# Patient Record
Sex: Female | Born: 1965 | Race: White | Hispanic: No | Marital: Married | Smoking: Current every day smoker
Health system: Southern US, Community
[De-identification: ages and names within clinical notes are randomized; demographics above are authoritative.]

## PROBLEM LIST (undated history)

## (undated) DIAGNOSIS — Z862 Personal history of diseases of the blood and blood-forming organs and certain disorders involving the immune mechanism: Secondary | ICD-10-CM

## (undated) DIAGNOSIS — J45909 Unspecified asthma, uncomplicated: Secondary | ICD-10-CM

## (undated) DIAGNOSIS — F909 Attention-deficit hyperactivity disorder, unspecified type: Secondary | ICD-10-CM

## (undated) DIAGNOSIS — D649 Anemia, unspecified: Secondary | ICD-10-CM

## (undated) DIAGNOSIS — G4730 Sleep apnea, unspecified: Secondary | ICD-10-CM

## (undated) DIAGNOSIS — F419 Anxiety disorder, unspecified: Secondary | ICD-10-CM

## (undated) DIAGNOSIS — C801 Malignant (primary) neoplasm, unspecified: Secondary | ICD-10-CM

## (undated) DIAGNOSIS — K219 Gastro-esophageal reflux disease without esophagitis: Secondary | ICD-10-CM

## (undated) DIAGNOSIS — M1990 Unspecified osteoarthritis, unspecified site: Secondary | ICD-10-CM

## (undated) DIAGNOSIS — K5792 Diverticulitis of intestine, part unspecified, without perforation or abscess without bleeding: Secondary | ICD-10-CM

## (undated) DIAGNOSIS — L932 Other local lupus erythematosus: Secondary | ICD-10-CM

## (undated) DIAGNOSIS — I10 Essential (primary) hypertension: Secondary | ICD-10-CM

## (undated) DIAGNOSIS — N393 Stress incontinence (female) (male): Secondary | ICD-10-CM

## (undated) HISTORY — PX: TUBAL LIGATION: SHX77

## (undated) SURGERY — Surgical Case
Anesthesia: *Unknown

---

## 1998-06-12 ENCOUNTER — Inpatient Hospital Stay (HOSPITAL_COMMUNITY): Admission: AD | Admit: 1998-06-12 | Discharge: 1998-06-12 | Payer: Self-pay | Admitting: *Deleted

## 1999-07-11 ENCOUNTER — Ambulatory Visit (HOSPITAL_COMMUNITY): Admission: RE | Admit: 1999-07-11 | Discharge: 1999-07-11 | Payer: Self-pay

## 2004-02-28 ENCOUNTER — Emergency Department (HOSPITAL_COMMUNITY): Admission: EM | Admit: 2004-02-28 | Discharge: 2004-02-28 | Payer: Self-pay | Admitting: Emergency Medicine

## 2004-06-16 ENCOUNTER — Other Ambulatory Visit: Admission: RE | Admit: 2004-06-16 | Discharge: 2004-06-16 | Payer: Self-pay | Admitting: Obstetrics and Gynecology

## 2004-12-23 ENCOUNTER — Encounter (INDEPENDENT_AMBULATORY_CARE_PROVIDER_SITE_OTHER): Payer: Self-pay | Admitting: *Deleted

## 2004-12-23 ENCOUNTER — Inpatient Hospital Stay (HOSPITAL_COMMUNITY): Admission: RE | Admit: 2004-12-23 | Discharge: 2004-12-26 | Payer: Self-pay | Admitting: Obstetrics and Gynecology

## 2005-02-07 ENCOUNTER — Other Ambulatory Visit: Admission: RE | Admit: 2005-02-07 | Discharge: 2005-02-07 | Payer: Self-pay | Admitting: Obstetrics and Gynecology

## 2008-05-09 ENCOUNTER — Emergency Department (HOSPITAL_COMMUNITY): Admission: EM | Admit: 2008-05-09 | Discharge: 2008-05-09 | Payer: Self-pay | Admitting: Emergency Medicine

## 2011-04-17 ENCOUNTER — Emergency Department: Payer: Self-pay | Admitting: Emergency Medicine

## 2011-04-21 NOTE — Op Note (Signed)
[name], [name]                ACCOUNT NO.:  [id number]   MEDICAL RECORD NO.:  [id number]          PATIENT TYPE:  INP   LOCATION:  9198                          FACILITY:  [facility]   PHYSICIAN:  Dineen Kid. Rana Snare, M.D.    DATE OF BIRTH:  [birth year]   DATE OF PROCEDURE:  12/23/2004  DATE OF DISCHARGE:                                 OPERATIVE REPORT   PREOPERATIVE DIAGNOSIS:  Previous cesarean section, desired sterility, and  intrauterine pregnancy at 39 weeks.   POSTOPERATIVE DIAGNOSIS:  Previous cesarean section, desired sterility, and  intrauterine pregnancy at 39 weeks.   PROCEDURE:  Low segment transverse cesarean section, left tubal ligation,  and right salpingectomy.   SURGEON:  Dineen Kid. Rana Snare, M.D.   ANESTHESIA:  Spinal.   INDICATIONS:  [name] is a 45 year old G3 P1 A1 at 71 weeks with a previous  cesarean section who  desired sterility.  She presents for repeat cesarean  section and tubal ligation.  The risks and benefits were discussed at  length.  Informed consent was obtained.   FINDINGS AT THE TIME OF SURGERY:  A viable female infant.  Apgars were 8 and  9.  pH is currently pending.  The weight is 7 pounds 5 ounces.  Also had  right tubal adhesions.   DESCRIPTION OF PROCEDURE:  After adequate analgesia, the patient was placed  in the supine position with left lateral tilt.  She was sterilely prepped  and draped.  The bladder was sterilely drained with a Foley catheter.  A  Pfannenstiel skin incision was made two fingerbreadths above the pubic  symphysis, taken down sharply to the fascia, which was incised transversely  and extended superiorly and inferiorly off the bellies of the rectus muscle,  which were separated sharply in the midline.  The peritoneum was entered  sharply.  Bladder flap was created and placed behind a bladder blade.  A low  segment myotomy incision was made down to the infant's vertex and extended  laterally with the operator's fingertips.  The  infant's vertex was delivered  atraumatically.  The nares and pharynx were suctioned.  The infant was then  delivered, cord clamped and cut, and handed to the pediatricians for  resuscitation.  Cord blood was obtained.  Placenta extracted manually.  The  uterus was exteriorized, wiped clean with a dry lap.  The myotomy incision  was closed in two layers, first with a running-locking and the second with  an imbricating layer of 0 Monocryl suture.  The left fallopian tube was  identified by the fimbriated end, a portion of the tube grasped with a  Babcock clamp, elevated.  Two sutures of 0 plain were passed into the  mesosalpinx, doubly ligated, central portion excised.  Tubal ostia were made  hemostatic with Bovie cautery, and the proximal portion suture ligated with  a 2-0 silk.  The right fallopian tube was densely adherent from the  midportion of the fallopian tube to the right ovary.  It was dissected from  the ovary using Bovie cautery.  Two Kelly clamps were placed across the  mesosalpinx and the fallopian tube.  The fallopian tube was excised, and two  sutures of 0 Monocryl were used to suture ligate the mesosalpinx.  The base  of the ovary was made hemostatic with Bovie cautery.  After good hemostasis  was achieved, the uterus was placed back into the peritoneal cavity, and  after a copious amount of irrigation and adequate hemostasis was ensured,  the peritoneum was closed with 0 Monocryl suture.  The rectus muscle was  plicated in the midline.  Irrigation was applied, and after adequate  hemostasis the fascia was closed with #1 Vicryl in a running fashion.  Irrigation was applied, and after hemostasis the skin staples and Steri-  Strips were applied.  The patient tolerated the procedure well and was  stable and transferred to the recovery room.  Sponge and instrument count  was normal x 3.  Again, the patient received 1 g Rocephin after delivery of  the placenta.   ESTIMATED  BLOOD LOSS:  600 cc.      DCL/MEDQ  D:  12/23/2004  T:  12/23/2004  Job:  [id number]

## 2011-04-21 NOTE — H&P (Signed)
NAMEJULIE, [name]                ACCOUNT NO.:  [id number]   MEDICAL RECORD NO.:  [id number]          PATIENT TYPE:  INP   LOCATION:  9198                          [facility]ACILITY:  [facility]   PHYSICIAN:  Dineen Kid. Rana Snare, M.D.    DATE OF BIRTH:  [birth year]   DATE OF ADMISSION:  12/23/2004  DATE OF DISCHARGE:                                HISTORY & PHYSICAL   HISTORY OF PRESENT ILLNESS:  [name] is a 45 year old, G3, P1, A1 at 39  weeks who presents for repeat cesarean section.  Her pregnancy was  complicated by advanced maternal age. She declined amniocentesis and  underwent normal ultrasound evaluation.  The pregnancy has otherwise been  uncomplicated. She does desire sterilization.  Her estimated date of  confinement is January 03, 2005.   PAST MEDICAL HISTORY:  Significant for previous cesarean section for failure  to progress of a 9 pound 8 ounce baby.  Past medical history also  significant for a history of a LEEP procedure and cutaneous lupus.   MEDICATIONS:  Prenatal vitamins. She has a sensitivity to CODEINE which  causes nausea.  She can take oxycodone.   PHYSICAL EXAMINATION:  VITAL SIGNS:  Blood pressure 124/68.  HEART:  Regular rate and rhythm.  LUNGS:  Clear to auscultation bilaterally.  ABDOMEN:  Gravid, nontender. Fundal height 38 cm. Cervix is closed, thick  and high.   IMPRESSION:  Intrauterine pregnancy at 39 weeks, previous cesarean section  desires repeat and also desires sterilization.   PLAN:  Repeat low segment transverse cesarean section and bilateral tubal  ligation. The risks and benefits were discussed at length which include but  not limited to risk of infection, bleeding, damage to uterus, tubes,  ovaries, bowel, bladder, risk of reaction to anesthesia, risks associated  with blood transfusion, also tubal failure quoted at 5 out of 1000 failure  rate. She gives her informed consent and wishes to proceed.      DCL/MEDQ  D:  12/23/2004  T:  12/23/2004  Job:   [id number]

## 2011-04-21 NOTE — Discharge Summary (Signed)
[name], [name]                ACCOUNT NO.:  [id number]   MEDICAL RECORD NO.:  [id number]          PATIENT TYPE:  INP   LOCATION:  9131                          [facility]ILITY[facility]  [facility]   PHYSICIAN:  Freddy Finner, M.D.   DATE OF BIRTH:  29-Sep-1966   DATE OF ADMISSION:  12/23/2004  DATE OF DISCHARGE:  12/26/2004                                 DISCHARGE SUMMARY   ADMISSION DIAGNOSES:  1.  Intrauterine pregnancy at term.  2.  Previous cesarean section, desires repeat.  3.  Multiparity, desires sterility.   DISCHARGE DIAGNOSES:  1.  Status post low transverse cesarean section.  2.  Viable female infant.  3.  Permanent sterilization.   PROCEDURES:  1.  Repeat low transverse cesarean section.  2.  Bilateral tubal ligation.   REASON FOR ADMISSION:  Please see dictated H&P.   HOSPITAL COURSE:  The patient is a 45 year old gravida 3, para 1 who was  admitted to [facility] at 39 weeks estimated gestational  age for __________ cesarean delivery.  Due to multiparity, the patient had  also desired permanent sterilization.  Her pregnancy had been complicated by  advanced maternal age.  She had declined amniocentesis and had undergone  normal ultrasound evaluation.  On the morning of admission, the patient was  taken to the operating room where spinal anesthesia was administered without  difficulty.  A low transverse incision was made with delivery of a viable  female infant weighing 7 pounds 5 ounces, Apgars of 8 at 1 minutes and 9 at  5 minutes.  Arterial cord gas was 7.29.  Bilateral tubal ligation was  performed without difficulty.  The patient tolerated the procedure well and  was taken to the recovery room in stable condition.  On postoperative day  #1, vital signs were stable.  She was afebrile, abdomen soft.  Abdominal  dressing was clean, dry and intact.  Labs revealed hemoglobin of 11.6.  On  postoperative day #2, the patient was without complaint.  Vital signs  remained stable.  She was afebrile.  Blood pressure 114/70.  Abdominal  dressing had been removed revealing incision was clean, dry and intact.  On  postop day #3, patient remained stable.  Vital signs were stable.  Patient  was afebrile.  Fundus firm and nontender.  She had good return of bowel  functions.  Incision was clean, dry and intact.  Staples were removed and  patient was discharged home.   CONDITION ON DISCHARGE:  Good.   DISCHARGE INSTRUCTIONS:  1.  Diet:  Regular as tolerated.  2.  Activity:  No heavy lifting.  No driving x2 weeks.  No vaginal entry.   FOLLOWUP:  The patient is to follow up in the office in 7-10 days for an  incision check.  She is to call for temperature greater than 100 degrees,  persistent nausea and vomiting, heavy vaginal bleeding, and/or redness or  drainage from the incisional site.   DISCHARGE MEDICATIONS:  1.  Percocet 5/325 #30 one p.o. q.4-6h. p.r.n.  2.  Motrin 600 mg q.6h. p.r.n.  3.  Prenatal vitamins one p.o. daily.  4.  Colace one p.o. daily p.r.n.      CC/MEDQ  D:  01/16/2005  T:  01/16/2005  Job:  [id number]

## 2011-04-22 ENCOUNTER — Emergency Department (HOSPITAL_COMMUNITY)
Admission: EM | Admit: 2011-04-22 | Discharge: 2011-04-23 | Disposition: A | Discharge status: Home / Self Care | Payer: Self-pay | Attending: Emergency Medicine | Admitting: Emergency Medicine

## 2011-04-22 DIAGNOSIS — F329 Major depressive disorder, single episode, unspecified: Secondary | ICD-10-CM | POA: Insufficient documentation

## 2011-04-22 DIAGNOSIS — I10 Essential (primary) hypertension: Secondary | ICD-10-CM | POA: Insufficient documentation

## 2011-04-22 DIAGNOSIS — X58XXXA Exposure to other specified factors, initial encounter: Secondary | ICD-10-CM | POA: Insufficient documentation

## 2011-04-22 DIAGNOSIS — S40029A Contusion of unspecified upper arm, initial encounter: Secondary | ICD-10-CM | POA: Insufficient documentation

## 2011-04-22 DIAGNOSIS — F411 Generalized anxiety disorder: Secondary | ICD-10-CM | POA: Insufficient documentation

## 2011-04-22 DIAGNOSIS — S5000XA Contusion of unspecified elbow, initial encounter: Secondary | ICD-10-CM | POA: Insufficient documentation

## 2011-04-22 DIAGNOSIS — F3289 Other specified depressive episodes: Secondary | ICD-10-CM | POA: Insufficient documentation

## 2011-04-22 LAB — BASIC METABOLIC PANEL
CO2: 27 mEq/L (ref 19–32)
Calcium: 8.9 mg/dL (ref 8.4–10.5)
Chloride: 106 mEq/L (ref 96–112)
Creatinine, Ser: 0.61 mg/dL (ref 0.4–1.2)
GFR calc Af Amer: >60 mL/min (ref >60)
Sodium: 144 mEq/L (ref 135–145)

## 2011-04-22 LAB — CBC
MCH: 32.9 pg (ref 26.0–34.0)
MCHC: 33.3 g/dL (ref 30.0–36.0)
MCV: 98.7 fL (ref 78.0–100.0)
Platelets: 193 10*3/uL (ref 150–400)

## 2011-04-22 LAB — DIFFERENTIAL
Basophils Absolute: 0 10*3/uL (ref 0.0–0.1)
Basophils Relative: 1 % (ref 0–1)
Eosinophils Absolute: 0.1 10*3/uL (ref 0.0–0.7)
Eosinophils Relative: 2 % (ref 0–5)
Lymphocytes Relative: 46 % (ref 12–46)
Lymphs Abs: 3 10*3/uL (ref 0.7–4.0)
Monocytes Absolute: 0.4 10*3/uL (ref 0.1–1.0)
Monocytes Relative: 6 % (ref 3–12)
Neutro Abs: 2.8 10*3/uL (ref 1.7–7.7)
Neutrophils Relative %: 45 % (ref 43–77)

## 2011-04-22 LAB — URINALYSIS, ROUTINE W REFLEX MICROSCOPIC
Bilirubin Urine: NEGATIVE
Glucose, UA: NEGATIVE mg/dL
Hgb urine dipstick: NEGATIVE
Ketones, ur: NEGATIVE mg/dL
Nitrite: NEGATIVE
Protein, ur: NEGATIVE mg/dL
Specific Gravity, Urine: 1.014 (ref 1.005–1.030)
Urobilinogen, UA: 0.2 mg/dL (ref 0.0–1.0)
pH: 6 (ref 5.0–8.0)

## 2011-04-22 LAB — POCT PREGNANCY, URINE: Preg Test, Ur: NEGATIVE

## 2011-04-22 LAB — RAPID URINE DRUG SCREEN, HOSP PERFORMED: Barbiturates: NOT DETECTED

## 2011-04-22 LAB — ETHANOL: Alcohol, Ethyl (B): 353 mg/dL — ABNORMAL HIGH (ref 0–10)

## 2012-11-26 ENCOUNTER — Emergency Department (HOSPITAL_COMMUNITY)
Admission: EM | Admit: 2012-11-26 | Discharge: 2012-11-26 | Disposition: A | Discharge status: Home / Self Care | Payer: PRIVATE HEALTH INSURANCE | Attending: Emergency Medicine | Admitting: Emergency Medicine

## 2012-11-26 ENCOUNTER — Encounter (HOSPITAL_COMMUNITY): Payer: Self-pay | Admitting: *Deleted

## 2012-11-26 DIAGNOSIS — J40 Bronchitis, not specified as acute or chronic: Secondary | ICD-10-CM

## 2012-11-26 DIAGNOSIS — G479 Sleep disorder, unspecified: Secondary | ICD-10-CM | POA: Insufficient documentation

## 2012-11-26 DIAGNOSIS — F411 Generalized anxiety disorder: Secondary | ICD-10-CM | POA: Insufficient documentation

## 2012-11-26 DIAGNOSIS — F909 Attention-deficit hyperactivity disorder, unspecified type: Secondary | ICD-10-CM | POA: Insufficient documentation

## 2012-11-26 DIAGNOSIS — F419 Anxiety disorder, unspecified: Secondary | ICD-10-CM

## 2012-11-26 DIAGNOSIS — Z79899 Other long term (current) drug therapy: Secondary | ICD-10-CM | POA: Insufficient documentation

## 2012-11-26 DIAGNOSIS — F17200 Nicotine dependence, unspecified, uncomplicated: Secondary | ICD-10-CM | POA: Insufficient documentation

## 2012-11-26 DIAGNOSIS — I10 Essential (primary) hypertension: Secondary | ICD-10-CM | POA: Insufficient documentation

## 2012-11-26 DIAGNOSIS — R05 Cough: Secondary | ICD-10-CM | POA: Insufficient documentation

## 2012-11-26 DIAGNOSIS — R059 Cough, unspecified: Secondary | ICD-10-CM | POA: Insufficient documentation

## 2012-11-26 HISTORY — DX: Anxiety disorder, unspecified: F41.9

## 2012-11-26 HISTORY — DX: Attention-deficit hyperactivity disorder, unspecified type: F90.9

## 2012-11-26 HISTORY — DX: Essential (primary) hypertension: I10

## 2012-11-26 LAB — GLUCOSE, CAPILLARY: Glucose-Capillary: 92 mg/dL (ref 70–99)

## 2012-11-26 MED ORDER — LORAZEPAM 2 MG/ML IJ SOLN
1.0000 mg | Freq: Once | INTRAMUSCULAR | Status: AC
Start: 2012-11-26 — End: 2012-11-26
  Administered 2012-11-26: 1 mg via INTRAMUSCULAR
  Filled 2012-11-26: qty 1

## 2012-11-26 MED ORDER — AMOXICILLIN 500 MG PO CAPS: 500.0000 mg | ORAL_CAPSULE | Freq: Three times a day (TID) | ORAL | Status: DC

## 2012-11-26 MED ORDER — LORAZEPAM 1 MG PO TABS: ORAL_TABLET | ORAL | Status: DC

## 2012-11-26 MED ORDER — LORAZEPAM 2 MG/ML IJ SOLN: 2.0000 mg | Freq: Once | INTRAMUSCULAR | Status: DC

## 2012-11-26 NOTE — ED Notes (Signed)
Pt here via [facility] - states she works for [employer], reported to her client's home this morning for work.  Per EMS,  family members were there and were concerned for pt caring for their family member.  States pt was stumbling around with slurred speech.  Family called EMS to transport pt.  Pt states is just under a lot of stress.  Pt tearful upon assessment.  States is stressed about Christmas.  Denies SI/HI, denies delusions/halluciations.  Reports ETOH use last night, denies any today.  Denies drug abuse.  Pupils PERRLA and dilated.  No neuro deficits.  Erratic conversation.  Speech clear.   nad noted at this time.

## 2012-11-26 NOTE — ED Notes (Signed)
Pt refusing bloodwork at this time

## 2012-11-26 NOTE — ED Provider Notes (Signed)
History   This chart was scribed for Benny Lennert, MD by Charolett Bumpers, ED Scribe. The patient was seen in room APA15/APA15. Patient's care was started at 1049.   CSN: [id number]  Arrival date & time 11/26/12  9528   First MD Initiated Contact with Patient 11/26/12 1049      Chief Complaint  Patient presents with  . Anxiety   HPI Comments: MALAIJAH HOUCHEN is a 46 y.o. female who presents to the Emergency Department complaining of anxiety. She states she has a h/o anxiety. She has Prozac and valium but stopped taking them because they are not working. She states that she takes Progesterone in the morning currently, and has taken ativan in the past which has help. She states that she is tired from working so much and needs to get some rest. She states that she is going through menopause as well and has trouble sleeping. She denies any SI or HI. She is a smoker. She also reports she has had a persistent productive cough with white/yellow phlegm for the the past 2 weeks.   Patient is a 46 y.o. female presenting with anxiety. The history is provided by the patient. No language interpreter was used.  Anxiety This is a chronic problem. The problem occurs constantly. The problem has been gradually worsening. Pertinent negatives include no chest pain, no abdominal pain and no headaches. She has tried nothing for the symptoms.    Past Medical History  Diagnosis Date  . Hypertension   . ADHD (attention deficit hyperactivity disorder)   . Anxiety     Past Surgical History  Procedure Date  . Cesarean section     No family history on file.  History  Substance Use Topics  . Smoking status: Current Every Day Smoker    Types: Cigarettes  . Smokeless tobacco: Not on file  . Alcohol Use: Yes     Comment: occasional    OB History    Grav Para Term Preterm Abortions TAB SAB Ect Mult Living                  Review of Systems  Constitutional: Negative for fatigue.  HENT:  Negative for congestion, sinus pressure and ear discharge.   Eyes: Negative for discharge.  Respiratory: Positive for cough.   Cardiovascular: Negative for chest pain.  Gastrointestinal: Negative for abdominal pain and diarrhea.  Genitourinary: Negative for frequency and hematuria.  Musculoskeletal: Negative for back pain.  Skin: Negative for rash.  Neurological: Negative for seizures and headaches.  Hematological: Negative.   Psychiatric/Behavioral: Positive for sleep disturbance. Negative for suicidal ideas and hallucinations. The patient is nervous/anxious.   All other systems reviewed and are negative.    Allergies  Epinephrine; Hydrocodone; and Vicodin  Home Medications   Current Outpatient Rx  Name  Route  Sig  Dispense  Refill  . BC HEADACHE PO   Oral   Take 1 packet by mouth every 4 (four) hours as needed. For pain         . DIAZEPAM 5 MG PO TABS   Oral   Take 5 mg by mouth 3 (three) times daily.         Marland Kitchen LISINOPRIL 20 MG PO TABS   Oral   Take 20 mg by mouth daily.         Marland Kitchen PANTOPRAZOLE SODIUM 20 MG PO TBEC   Oral   Take 20 mg by mouth daily.         Marland Kitchen  PROGESTERONE MICRONIZED 200 MG PO CAPS   Oral   Take 200 mg by mouth daily.           BP 108/56  Pulse 85  Temp 98.1 F (36.7 C) (Oral)  Resp 18  SpO2 100%  Physical Exam  Nursing note and vitals reviewed. Constitutional: She is oriented to person, place, and time. She appears well-developed.  HENT:  Head: Normocephalic and atraumatic.  Eyes: Conjunctivae normal and EOM are normal. No scleral icterus.  Neck: Neck supple. No thyromegaly present.  Cardiovascular: Normal rate, regular rhythm and normal heart sounds.  Exam reveals no gallop and no friction rub.   No murmur heard. Pulmonary/Chest: Effort normal. No stridor. No respiratory distress. She has wheezes. She has no rales. She exhibits no tenderness.       Minimal wheezing bilaterally.   Abdominal: Soft. Bowel sounds are normal. She  exhibits no distension. There is no tenderness. There is no rebound.  Musculoskeletal: Normal range of motion. She exhibits no edema.  Lymphadenopathy:    She has no cervical adenopathy.  Neurological: She is oriented to person, place, and time. Coordination normal.  Skin: No rash noted. No erythema.  Psychiatric: Her behavior is normal. Her mood appears anxious.       Moderately anxious.     ED Course  Procedures (including critical care time)  DIAGNOSTIC STUDIES: Oxygen Saturation is 100% on room air, normal by my interpretation.    COORDINATION OF CARE:  10:55-Discussed planned course of treatment with the patient including Ativan here in ED and d/c home with a prescription, who is agreeable at this time. Advised to f/u with PCP for continued management of her anxiety.   No results found.   No diagnosis found.    MDM    The chart was scribed for me under my direct supervision.  I personally performed the history, physical, and medical decision making and all procedures in the evaluation of this patient.Benny Lennert, MD 11/26/12 1101

## 2012-11-26 NOTE — ED Notes (Signed)
Pt states, " I'm not taking a piss test."  nad noted at this time.

## 2013-01-22 ENCOUNTER — Other Ambulatory Visit: Payer: Self-pay | Admitting: Urology

## 2013-02-07 ENCOUNTER — Encounter (HOSPITAL_BASED_OUTPATIENT_CLINIC_OR_DEPARTMENT_OTHER): Payer: Self-pay | Admitting: *Deleted

## 2013-02-10 ENCOUNTER — Encounter (HOSPITAL_BASED_OUTPATIENT_CLINIC_OR_DEPARTMENT_OTHER): Payer: Self-pay | Admitting: *Deleted

## 2013-02-10 NOTE — Progress Notes (Signed)
NPO AFTER MN. ARRIVES AT 0615. NEEDS HG. WILL TAKE PROTONIX AM OF SURG W/ SIP OF WATER.

## 2013-02-12 NOTE — H&P (Signed)
istory of Present Illness     [name] is a 47 yo WF who presents with an 8 year history of incontinence since the birth of a child.  The incontinence has worsened to the point that she has to wear pads all day. She will get urgency with standing, but not with sitting.  She leaks with coughing and lifting.  She has no leakage with intercourse.  She has a good stream, but she can have urge with small voids.  She doesn't feel like she empties completely and she had an elevated PVR on an pelvic US last year.   She has been seeing some intermittant rusty urine for the last month.  She has had no dysuria.  She can have pressure with bending.  Her UA today is clear.  She has had no recent UTI's.  Her urine has had an odor.  She has had no GU surgery.  She is menopausal.  She has had 2 pregnancies with c-sections and she has had a tubal.  She has not had prolapse symptoms.   She has tried a femstop but didn't like it.   She has tried Oxytrol without success and it broke her skin out.   Past Medical History Problems  1. History of  Anxiety (Symptom) 300.00 2. History of  Bronchitis 490 3. History of  Depression 311 4. History of  FH Unobtainable - Patient Adopted 5. History of  Hypertension 401.9  Surgical History Problems  1. History of  Cesarean Section 2. History of  Cesarean Section 3. History of  Salpingectomy Right 4. History of  Tubal Ligation V25.2  Current Meds 1. Advil TABS; Therapy: (Recorded:05Feb2014) to 2. Ativan 1 MG Oral Tablet; Therapy: (Recorded:05Feb2014) to 3. Lisinopril 20 MG Oral Tablet; Therapy: (Recorded:05Feb2014) to 4. Progesterone Micronized 200 MG Oral Capsule; Therapy: (Recorded:05Feb2014) to  Allergies Medication  1. EPINEPHrine HCl SOLN 2. Vicodin TABS  Family History Problems  1. Family history of  Family Health Status Number Of Children 1 son  1 daughter 2. Paternal grandfather's history of  Renal Failure 3. Paternal grandfather's history of   Uremia  Social History Problems    Alcohol Use 3 a week   Caffeine Use 1 a day   Marital History - Currently Married   Occupation: Designer, jewellery   Tobacco Use 305.1 smokes 1 ppd x 26 years  Review of Systems Genitourinary, constitutional, skin, eye, otolaryngeal, hematologic/lymphatic, cardiovascular, pulmonary, endocrine, musculoskeletal, gastrointestinal, neurological and psychiatric system(s) were reviewed and pertinent findings if present are noted.  Genitourinary: urinary frequency, urinary urgency, nocturia, incontinence, hematuria and dyspareunia (but she gets some relief with Replens).  The patient presents with complaints of diarrhea (Since January. She was on Augmentin in December for the bronchitis. ).  Integumentary: skin rash/lesion and pruritus.  Hematologic/Lymphatic: a tendency to easily bruise.  Respiratory: cough.    Vitals Vital Signs [Data Includes: Last 1 Day]  05Feb2014 10:56AM  BMI Calculated: 22.5 BSA Calculated: 1.72 Height: 5 ft 6 in Weight: 140 lb  Blood Pressure: 118 / 81 Temperature: 98.1 F Heart Rate: 94  Physical Exam Constitutional: Well nourished and well developed . No acute distress.  ENT:. The ears and nose are normal in appearance.  Neck: The appearance of the neck is normal and no neck mass is present.  Pulmonary: No respiratory distress and normal respiratory rhythm and effort.  Cardiovascular: Heart rate and rhythm are normal . No peripheral edema.  Abdomen: The abdomen is soft and nontender. No masses are  palpated. No CVA tenderness. No hernias are palpable. No hepatosplenomegaly noted.  Genitourinary:  Chaperone Present: .  Examination of the external genitalia shows normal female external genitalia and no lesions. The urethra is normal in appearance and not tender. There is no urethral mass. Urethral hypermobility is present. (with leakage with straining). Vaginal exam demonstrates no abnormalities. The cervix is is without  abnormalities. The uterus is without abnormalities. The adnexa are palpably normal. The bladder is normal on palpation, non tender and not distended. Postvoid residual urine is 38 mL. The anus is normal on inspection. The perineum is normal on inspection.  Lymphatics: The supraclavicular, axillary, femoral and inguinal nodes are not enlarged or tender.  Skin: Normal skin turgor, no visible rash and no visible skin lesions.  Neuro/Psych:. Mood and affect are appropriate.    Results/Data Urine [Data Includes: Last 1 Day]   05Feb2014  COLOR STRAW   APPEARANCE CLEAR   SPECIFIC GRAVITY <1.005   pH 6.0   GLUCOSE NEG mg/dL  BILIRUBIN NEG   KETONE NEG mg/dL  BLOOD NEG   PROTEIN NEG mg/dL  UROBILINOGEN 0.2 mg/dL  NITRITE NEG   LEUKOCYTE ESTERASE NEG    PVR: Ultrasound PVR 38 ml.    Procedure  Procedure: Cystoscopy  Chaperone Present: Cindee Lame.  Indication: Hematuria. Lower Urinary Tract Symptoms.  Informed Consent: Risks, benefits, and potential adverse events were discussed and informed consent was obtained from the patient.  Prep: The patient was prepped with betadine.  Procedure Note:  Urethral meatus:. No abnormalities.  Anterior urethra: No abnormalities.  Bladder: Visulization was clear. The ureteral orifices were in the normal anatomic position bilaterally and had clear efflux of urine. A systematic survey of the bladder demonstrated no bladder tumors or stones. The mucosa was smooth without abnormalities. Examination of the bladder demonstrated no trabeculation no erythematous mucosa. The patient tolerated the procedure well.  Complications: None.    Assessment Assessed  1. Urge And Stress Incontinence 788.33 2. Gross Hematuria 599.71   I am not sure that she is seeing hematuria and she thinks that she could just have concentrated urine.  Her cystoscopy today is negative and is her urine. She has genuine stream incontinence with possible ISD and leaked freely with mild  straining in the lithotomy position.  She doesn't have much prolapse.  She has some urge symptoms but they seem to be only when she stands and her bladder has no trabeculation.   Plan Health Maintenance (V70.0)  1. UA With REFLEX  Done: 05Feb2014 10:33AM Urge And Stress Incontinence (788.33)  2. Cysto  Requested for: 05Feb2014 3. Pelvic Exam  Requested for: 05Feb2014 4. PVR U/S  Done: 05Feb2014 5. Follow-up Schedule Surgery Office  Follow-up  Requested for: 05Feb2014   I don't believe she needs urodynamics. I am going to have her collect a urine when she sees the dark urine and if blood is proved, she will need a CT scan. I discussed PT and a mid urethral sling as her treatment options.  She is interested in the sling. I reviewed the risks of bleeding, infection, mesh complications such as erosion into the bladder or urethra or vaginal extrusion, injury to adjacent organs, vaginal scarring and dysparunia,  persistent stress incontinence, denovo urge incontinence, obstruction requiring self cath or release of the repair, thrombotic events, and anesthetic complications.   She is interested in a spinal anesthetic.  I reviewed the recovery period and restrictions. I have put in a posting sheet.

## 2013-02-13 ENCOUNTER — Encounter (HOSPITAL_BASED_OUTPATIENT_CLINIC_OR_DEPARTMENT_OTHER): Payer: Self-pay | Admitting: Anesthesiology

## 2013-02-13 ENCOUNTER — Ambulatory Visit (HOSPITAL_BASED_OUTPATIENT_CLINIC_OR_DEPARTMENT_OTHER): Payer: PRIVATE HEALTH INSURANCE | Admitting: Anesthesiology

## 2013-02-13 ENCOUNTER — Encounter (HOSPITAL_BASED_OUTPATIENT_CLINIC_OR_DEPARTMENT_OTHER)
Admission: RE | Disposition: A | Discharge status: Home / Self Care | Payer: Self-pay | Source: Ambulatory Visit | Attending: Urology

## 2013-02-13 ENCOUNTER — Encounter (HOSPITAL_BASED_OUTPATIENT_CLINIC_OR_DEPARTMENT_OTHER): Payer: Self-pay | Admitting: *Deleted

## 2013-02-13 ENCOUNTER — Ambulatory Visit (HOSPITAL_BASED_OUTPATIENT_CLINIC_OR_DEPARTMENT_OTHER)
Admission: RE | Admit: 2013-02-13 | Discharge: 2013-02-13 | Disposition: A | Discharge status: Home / Self Care | Payer: PRIVATE HEALTH INSURANCE | Source: Ambulatory Visit | Attending: Urology | Admitting: Urology

## 2013-02-13 DIAGNOSIS — K219 Gastro-esophageal reflux disease without esophagitis: Secondary | ICD-10-CM | POA: Insufficient documentation

## 2013-02-13 DIAGNOSIS — R310 Gross hematuria: Secondary | ICD-10-CM | POA: Insufficient documentation

## 2013-02-13 DIAGNOSIS — N393 Stress incontinence (female) (male): Secondary | ICD-10-CM

## 2013-02-13 DIAGNOSIS — Z79899 Other long term (current) drug therapy: Secondary | ICD-10-CM | POA: Insufficient documentation

## 2013-02-13 DIAGNOSIS — R062 Wheezing: Secondary | ICD-10-CM | POA: Insufficient documentation

## 2013-02-13 DIAGNOSIS — I10 Essential (primary) hypertension: Secondary | ICD-10-CM | POA: Insufficient documentation

## 2013-02-13 DIAGNOSIS — N3946 Mixed incontinence: Secondary | ICD-10-CM | POA: Insufficient documentation

## 2013-02-13 HISTORY — DX: Personal history of diseases of the blood and blood-forming organs and certain disorders involving the immune mechanism: Z86.2

## 2013-02-13 HISTORY — PX: BLADDER SUSPENSION: SHX72

## 2013-02-13 HISTORY — DX: Gastro-esophageal reflux disease without esophagitis: K21.9

## 2013-02-13 HISTORY — DX: Stress incontinence (female) (male): N39.3

## 2013-02-13 HISTORY — DX: Other local lupus erythematosus: L93.2

## 2013-02-13 SURGERY — CREATION, URETHRAL SLING, RETROPUBIC APPROACH, USING POLYPROPYLENE TAPE
Anesthesia: General | Site: Vagina | Wound class: Clean Contaminated

## 2013-02-13 MED ORDER — PROPOFOL 10 MG/ML IV BOLUS
INTRAVENOUS | Status: DC | PRN
  Administered 2013-02-13: 200 mg via INTRAVENOUS

## 2013-02-13 MED ORDER — MIDAZOLAM HCL 5 MG/5ML IJ SOLN
INTRAMUSCULAR | Status: DC | PRN
  Administered 2013-02-13: 2 mg via INTRAVENOUS

## 2013-02-13 MED ORDER — ONDANSETRON HCL 4 MG/2ML IJ SOLN
INTRAMUSCULAR | Status: DC | PRN
  Administered 2013-02-13: 4 mg via INTRAVENOUS

## 2013-02-13 MED ORDER — ACETAMINOPHEN 10 MG/ML IV SOLN
INTRAVENOUS | Status: DC | PRN
  Administered 2013-02-13: 1000 mg via INTRAVENOUS

## 2013-02-13 MED ORDER — SODIUM CHLORIDE 0.9 % IJ SOLN
INTRAMUSCULAR | Status: DC | PRN
  Administered 2013-02-13: 4 mL

## 2013-02-13 MED ORDER — DOCUSATE SODIUM 100 MG PO CAPS: 100.0000 mg | ORAL_CAPSULE | Freq: Two times a day (BID) | ORAL | Status: DC

## 2013-02-13 MED ORDER — PROMETHAZINE HCL 25 MG/ML IJ SOLN
6.2500 mg | INTRAMUSCULAR | Status: DC | PRN
  Filled 2013-02-13: qty 1

## 2013-02-13 MED ORDER — ONDANSETRON HCL 4 MG/2ML IJ SOLN
4.0000 mg | Freq: Four times a day (QID) | INTRAMUSCULAR | Status: DC | PRN
  Filled 2013-02-13: qty 2

## 2013-02-13 MED ORDER — SODIUM CHLORIDE 0.9 % IJ SOLN
3.0000 mL | INTRAMUSCULAR | Status: DC | PRN
  Filled 2013-02-13: qty 3

## 2013-02-13 MED ORDER — FENTANYL CITRATE 0.05 MG/ML IJ SOLN
25.0000 ug | INTRAMUSCULAR | Status: DC | PRN
  Filled 2013-02-13: qty 1

## 2013-02-13 MED ORDER — ACETAMINOPHEN 325 MG PO TABS
650.0000 mg | ORAL_TABLET | ORAL | Status: DC | PRN
  Filled 2013-02-13: qty 2

## 2013-02-13 MED ORDER — LACTATED RINGERS IV SOLN
INTRAVENOUS | Status: DC
  Administered 2013-02-13 (×2): via INTRAVENOUS
  Filled 2013-02-13: qty 1000

## 2013-02-13 MED ORDER — FENTANYL CITRATE 0.05 MG/ML IJ SOLN
INTRAMUSCULAR | Status: DC | PRN
  Administered 2013-02-13 (×4): 50 ug via INTRAVENOUS

## 2013-02-13 MED ORDER — TRAMADOL HCL 50 MG PO TABS
50.0000 mg | ORAL_TABLET | Freq: Once | ORAL | Status: AC
  Administered 2013-02-13: 50 mg via ORAL
  Filled 2013-02-13: qty 1

## 2013-02-13 MED ORDER — CIPROFLOXACIN IN D5W 400 MG/200ML IV SOLN
400.0000 mg | INTRAVENOUS | Status: AC
  Administered 2013-02-13: 400 mg via INTRAVENOUS
  Filled 2013-02-13: qty 200

## 2013-02-13 MED ORDER — METOCLOPRAMIDE HCL 5 MG/ML IJ SOLN
INTRAMUSCULAR | Status: DC | PRN
  Administered 2013-02-13: 10 mg via INTRAVENOUS

## 2013-02-13 MED ORDER — SODIUM CHLORIDE 0.9 % IV SOLN
250.0000 mL | INTRAVENOUS | Status: DC | PRN
  Filled 2013-02-13: qty 250

## 2013-02-13 MED ORDER — SODIUM CHLORIDE 0.9 % IR SOLN
Status: DC | PRN
  Administered 2013-02-13: 08:00:00

## 2013-02-13 MED ORDER — TRAMADOL-ACETAMINOPHEN 37.5-325 MG PO TABS: 1.0000 | ORAL_TABLET | Freq: Four times a day (QID) | ORAL | Status: DC | PRN

## 2013-02-13 MED ORDER — STERILE WATER FOR IRRIGATION IR SOLN
Status: DC | PRN
  Administered 2013-02-13: 1000 mL

## 2013-02-13 MED ORDER — LIDOCAINE HCL (CARDIAC) 20 MG/ML IV SOLN
INTRAVENOUS | Status: DC | PRN
  Administered 2013-02-13: 60 mg via INTRAVENOUS

## 2013-02-13 MED ORDER — SODIUM CHLORIDE 0.9 % IJ SOLN
3.0000 mL | Freq: Two times a day (BID) | INTRAMUSCULAR | Status: DC
  Filled 2013-02-13: qty 3

## 2013-02-13 MED ORDER — ACETAMINOPHEN 650 MG RE SUPP
650.0000 mg | RECTAL | Status: DC | PRN
  Filled 2013-02-13: qty 1

## 2013-02-13 MED ORDER — DEXAMETHASONE SODIUM PHOSPHATE 4 MG/ML IJ SOLN
INTRAMUSCULAR | Status: DC | PRN
  Administered 2013-02-13: 10 mg via INTRAVENOUS

## 2013-02-13 MED ORDER — KETOROLAC TROMETHAMINE 30 MG/ML IJ SOLN
15.0000 mg | Freq: Once | INTRAMUSCULAR | Status: DC | PRN
  Filled 2013-02-13: qty 1

## 2013-02-13 MED ORDER — CIPROFLOXACIN HCL 500 MG PO TABS: 500.0000 mg | ORAL_TABLET | Freq: Two times a day (BID) | ORAL | Status: DC

## 2013-02-13 SURGICAL SUPPLY — 35 items
BAG URINE DRAINAGE (UROLOGICAL SUPPLIES) ×2 IMPLANT
BLADE SURG 15 STRL LF DISP TIS (BLADE) ×1 IMPLANT
BLADE SURG 15 STRL SS (BLADE) ×1
BLADE SURG ROTATE 9660 (MISCELLANEOUS) ×2 IMPLANT
CANISTER SUCTION 1200CC (MISCELLANEOUS) ×2 IMPLANT
CATH FOLEY 2WAY SLVR  5CC 16FR (CATHETERS) ×1
CATH FOLEY 2WAY SLVR 5CC 16FR (CATHETERS) ×1 IMPLANT
CLEANER CAUTERY TIP 5X5 PAD (MISCELLANEOUS) ×1 IMPLANT
CLOTH BEACON ORANGE TIMEOUT ST (SAFETY) ×2 IMPLANT
COVER MAYO STAND STRL (DRAPES) ×2 IMPLANT
DERMABOND ADVANCED (GAUZE/BANDAGES/DRESSINGS) ×1
DERMABOND ADVANCED .7 DNX12 (GAUZE/BANDAGES/DRESSINGS) ×1 IMPLANT
DRAPE LG THREE QUARTER DISP (DRAPES) ×2 IMPLANT
DRAPE UNDERBUTTOCKS STRL (DRAPE) ×2 IMPLANT
ELECT REM PT RETURN 9FT ADLT (ELECTROSURGICAL) ×2
ELECTRODE REM PT RTRN 9FT ADLT (ELECTROSURGICAL) ×1 IMPLANT
GAUZE PACKING IODOFORM 2 (PACKING) ×2 IMPLANT
GLOVE BIO SURGEON STRL SZ 6.5 (GLOVE) ×4 IMPLANT
GLOVE BIO SURGEON STRL SZ7 (GLOVE) ×2 IMPLANT
GLOVE SURG SS PI 8.0 STRL IVOR (GLOVE) ×2 IMPLANT
GOWN PREVENTION PLUS LG XLONG (DISPOSABLE) ×2 IMPLANT
GOWN STRL REIN XL XLG (GOWN DISPOSABLE) ×2 IMPLANT
HOLDER FOLEY CATH W/STRAP (MISCELLANEOUS) ×2 IMPLANT
NEEDLE HYPO 22GX1.5 SAFETY (NEEDLE) ×2 IMPLANT
PACK BASIN DAY SURGERY FS (CUSTOM PROCEDURE TRAY) ×2 IMPLANT
PACK CYSTOSCOPY (CUSTOM PROCEDURE TRAY) ×2 IMPLANT
PAD CLEANER CAUTERY TIP 5X5 (MISCELLANEOUS) ×1
PENCIL BUTTON HOLSTER BLD 10FT (ELECTRODE) ×2 IMPLANT
PLUG CATH AND CAP STER (CATHETERS) ×2 IMPLANT
SLING SYSTEM SPARC (Sling) ×2 IMPLANT
SUT VIC AB 2-0 UR6 27 (SUTURE) ×2 IMPLANT
SYR BULB IRRIGATION 50ML (SYRINGE) ×2 IMPLANT
SYRINGE 10CC LL (SYRINGE) ×2 IMPLANT
TUBE CONNECTING 12X1/4 (SUCTIONS) ×2 IMPLANT
YANKAUER SUCT BULB TIP NO VENT (SUCTIONS) ×2 IMPLANT

## 2013-02-13 NOTE — Brief Op Note (Signed)
02/13/2013  8:09 AM  PATIENT:  [name]  47 y.o. female  PRE-OPERATIVE DIAGNOSIS:  STRESS URINARY INCONTINENCE  POST-OPERATIVE DIAGNOSIS: Same PROCEDURE:  Procedure(s): SPARC SLING (N/A)  SURGEON:  Surgeon(s) and Role:    * Anner Crete, MD - Primary  PHYSICIAN ASSISTANT:   ASSISTANTS: none   ANESTHESIA:   general  EBL:  Total I/O In: 200 [I.V.:200] Out: -   BLOOD ADMINISTERED:none  DRAINS: Urinary Catheter (Foley)   LOCAL MEDICATIONS USED:  NONE  SPECIMEN:  No Specimen  DISPOSITION OF SPECIMEN:  N/A  COUNTS:  YES  TOURNIQUET:  * No tourniquets in log *  DICTATION: .Other Dictation: Dictation Number [id number]  PLAN OF CARE: Discharge to home after PACU  PATIENT DISPOSITION:  PACU - hemodynamically stable.   Delay start of Pharmacological VTE agent (>24hrs) due to surgical blood loss or risk of bleeding: not applicable

## 2013-02-13 NOTE — Interval H&P Note (Signed)
History and Physical Interval Note:  02/13/2013 7:25 AM  [name]  has presented today for surgery, with the diagnosis of STRESS URINARY INCONTINENCE  The various methods of treatment have been discussed with the patient and family. After consideration of risks, benefits and other options for treatment, the patient has consented to  Procedure(s): SPARC SLING (N/A) as a surgical intervention .  The patient's history has been reviewed, patient examined, no change in status, stable for surgery.  I have reviewed the patient's chart and labs.  Questions were answered to the patient's satisfaction.     WRENN,JOHN J

## 2013-02-13 NOTE — Anesthesia Postprocedure Evaluation (Signed)
  Anesthesia Post-op Note  Patient: [name]  Procedure(s) Performed: Procedure(s) (LRB): SPARC SLING (N/A)  Patient Location: PACU  Anesthesia Type: General  Level of Consciousness: awake and alert   Airway and Oxygen Therapy: Patient Spontanous Breathing  Post-op Pain: mild  Post-op Assessment: Post-op Vital signs reviewed, Patient's Cardiovascular Status Stable, Respiratory Function Stable, Patent Airway and No signs of Nausea or vomiting  Last Vitals:  Filed Vitals:   02/13/13 0626  BP: 131/88  Pulse: 97  Temp: 36.9 C  Resp: 18    Post-op Vital Signs: stable   Complications: No apparent anesthesia complications

## 2013-02-13 NOTE — Anesthesia Preprocedure Evaluation (Addendum)
Anesthesia Evaluation  Patient identified by MRN, date of birth, ID band Patient awake    Reviewed: Allergy & Precautions, H&P , NPO status , Patient's Chart, lab work & pertinent test results  Airway Mallampati: II TM Distance: >3 FB Neck ROM: Full    Dental no notable dental hx.    Pulmonary Current Smoker,    + wheezing      Cardiovascular hypertension, Pt. on medications Rhythm:Regular Rate:Normal     Neuro/Psych negative neurological ROS  negative psych ROS   GI/Hepatic Neg liver ROS, GERD-  Medicated,  Endo/Other  negative endocrine ROS  Renal/GU negative Renal ROS  negative genitourinary   Musculoskeletal negative musculoskeletal ROS (+)   Abdominal   Peds negative pediatric ROS (+)  Hematology negative hematology ROS (+)   Anesthesia Other Findings   Reproductive/Obstetrics negative OB ROS                         Anesthesia Physical Anesthesia Plan  ASA: II  Anesthesia Plan: General   Post-op Pain Management:    Induction: Intravenous  Airway Management Planned: LMA  Additional Equipment:   Intra-op Plan:   Post-operative Plan:   Informed Consent: I have reviewed the patients History and Physical, chart, labs and discussed the procedure including the risks, benefits and alternatives for the proposed anesthesia with the patient or authorized representative who has indicated his/her understanding and acceptance.   Dental advisory given  Plan Discussed with: CRNA and Surgeon  Anesthesia Plan Comments:         Anesthesia Quick Evaluation

## 2013-02-13 NOTE — Anesthesia Procedure Notes (Addendum)
Date/Time: 02/13/2013 7:39 AM Performed by: Norva Pavlov   Procedure Name: LMA Insertion Date/Time: 02/13/2013 7:39 AM Performed by: Norva Pavlov Pre-anesthesia Checklist: Patient identified, Emergency Drugs available, Suction available and Patient being monitored Patient Re-evaluated:Patient Re-evaluated prior to inductionOxygen Delivery Method: Circle System Utilized Preoxygenation: Pre-oxygenation with 100% oxygen Intubation Type: IV induction Ventilation: Mask ventilation without difficulty LMA: LMA inserted LMA Size: 4.0 Number of attempts: 1 Airway Equipment and Method: bite block Placement Confirmation: positive ETCO2 Tube secured with: Tape Dental Injury: Teeth and Oropharynx as per pre-operative assessment

## 2013-02-13 NOTE — Op Note (Signed)
NAMESHAMIA, UPPAL                ACCOUNT NO.:  [id number]  MEDICAL RECORD NO.:  [id number]  LOCATION:                                 FACILITY:  PHYSICIAN:  Excell Seltzer. Annabell Howells, M.D.    DATE OF BIRTH:  1966/05/23  DATE OF PROCEDURE:  02/13/2013 DATE OF DISCHARGE:                              OPERATIVE REPORT   PROCEDURE:  SPARC sling.  PREOPERATIVE DIAGNOSIS:  Stress incontinence.  POSTOPERATIVE DIAGNOSIS:  Stress incontinence.  SURGEON:  Excell Seltzer. Annabell Howells, M.D.  ANESTHESIA:  General.  SPECIMEN:  None.  DRAINS:  A 16-French Foley catheter and vaginal pack.  BLOOD LOSS:  50 mL.  COMPLICATIONS:  None.  INDICATIONS:  [name] is a 47 year old white female with stress incontinence with problems with intrinsic sphincter deficiency, has elected mid urethral sling for repair.  FINDINGS OF PROCEDURE:  She was given Cipro.  She was taken to the operating room.  She was placed in the lithotomy position.  After induction of general anesthetic, she was fitted with PAS hose.  Her mons was clipped.  She was prepped with Betadine solution and draped in usual sterile fashion.  A weighted vaginal retractor was placed.  The anterior vaginal wall in the mid urethral area was infiltrated with sterile saline.  She has no epinephrine allergies, so lidocaine with epi was not used.  Two small incisions were made just above the pubis, approximately 2 cm lateral to the midline on either side, and an hemostat was used to spread the subcutaneous tissue down to the fascia.  An anterior vaginal wall incision was made over the mid urethra, and the pubourethral fascia was pierced with the tip of the Strully scissors allowing room to place the tip of finger.  Foley catheter had been placed at the beginning of the procedure, and the bladder had been drained.  At this point, the [facility] trocars were passed, first on the left.  The tip was taken down to the back edge of the pubis, walked along to the back of  pubis and out into the vaginal incision under finger guidance with care taken to protect urethra.  This was then repeated on the right side.  At this point, cystoscopy was performed using the 22-French scope and 70 degree lens.  The urethra was unremarkable as was the bladder.  Ureteral orifices were unremarkable.  No tumors or stones were noted.  No bladder wall injury was noted.  At this point, the [facility] mesh was snapped to the trocars and pulled into position, and repeat cystoscopy once again revealed no evidence of bladder injury.  The [facility] mesh sheath was then removed and the tension of the sling was suggested to allow a loose fit over the urethra using a right-angle clamp to gauge the tension.  At this point, the anterior vaginal wall was closed with a running locked 2-0 Vicryl suture.  The abdominal ends of the mesh were then trimmed allowing the tips to drop back into the subcutaneous space.  The abdominal wound was cleansed, dried, and closed with Dermabond.  A 2- inch iodoform vaginal pack was placed.  The Foley catheter was placed to straight drainage.  The  patient was taken down from lithotomy position. Her anesthetic was reversed.  She was moved to recovery room in stable condition.  There were no complications.     Excell Seltzer. Annabell Howells, M.D.     JJW/MEDQ  D:  02/13/2013  T:  02/13/2013  Job:  [id number]

## 2013-02-13 NOTE — Transfer of Care (Signed)
Immediate Anesthesia Transfer of Care Note  Patient: [name]  Procedure(s) Performed: Procedure(s) (LRB): SPARC SLING (N/A)  Patient Location: PACU  Anesthesia Type: General  Level of Consciousness: awake, alert  and oriented  Airway & Oxygen Therapy: Patient Spontanous Breathing and Patient connected to face mask oxygen  Post-op Assessment: Report given to PACU RN and Post -op Vital signs reviewed and stable  Post vital signs: Reviewed and stable  Complications: No apparent anesthesia complications

## 2013-02-14 NOTE — Addendum Note (Signed)
Addendum created 02/14/13 0756 by Fran Lowes, CRNA   Modules edited: Charges VN

## 2013-02-17 ENCOUNTER — Encounter (HOSPITAL_BASED_OUTPATIENT_CLINIC_OR_DEPARTMENT_OTHER): Payer: Self-pay | Admitting: Urology

## 2013-03-28 ENCOUNTER — Emergency Department (HOSPITAL_COMMUNITY)
Admission: EM | Admit: 2013-03-28 | Discharge: 2013-03-28 | Disposition: A | Discharge status: Home / Self Care | Payer: PRIVATE HEALTH INSURANCE | Attending: Emergency Medicine | Admitting: Emergency Medicine

## 2013-03-28 ENCOUNTER — Encounter (HOSPITAL_COMMUNITY): Payer: Self-pay

## 2013-03-28 DIAGNOSIS — F341 Dysthymic disorder: Secondary | ICD-10-CM | POA: Insufficient documentation

## 2013-03-28 DIAGNOSIS — I10 Essential (primary) hypertension: Secondary | ICD-10-CM | POA: Insufficient documentation

## 2013-03-28 DIAGNOSIS — Z87448 Personal history of other diseases of urinary system: Secondary | ICD-10-CM | POA: Insufficient documentation

## 2013-03-28 DIAGNOSIS — F10929 Alcohol use, unspecified with intoxication, unspecified: Secondary | ICD-10-CM

## 2013-03-28 DIAGNOSIS — K219 Gastro-esophageal reflux disease without esophagitis: Secondary | ICD-10-CM | POA: Insufficient documentation

## 2013-03-28 DIAGNOSIS — Z79899 Other long term (current) drug therapy: Secondary | ICD-10-CM | POA: Insufficient documentation

## 2013-03-28 DIAGNOSIS — Z8739 Personal history of other diseases of the musculoskeletal system and connective tissue: Secondary | ICD-10-CM | POA: Insufficient documentation

## 2013-03-28 DIAGNOSIS — C679 Malignant neoplasm of bladder, unspecified: Secondary | ICD-10-CM | POA: Insufficient documentation

## 2013-03-28 DIAGNOSIS — F329 Major depressive disorder, single episode, unspecified: Secondary | ICD-10-CM

## 2013-03-28 DIAGNOSIS — Z8659 Personal history of other mental and behavioral disorders: Secondary | ICD-10-CM | POA: Insufficient documentation

## 2013-03-28 DIAGNOSIS — F17200 Nicotine dependence, unspecified, uncomplicated: Secondary | ICD-10-CM | POA: Insufficient documentation

## 2013-03-28 DIAGNOSIS — Z8701 Personal history of pneumonia (recurrent): Secondary | ICD-10-CM | POA: Insufficient documentation

## 2013-03-28 DIAGNOSIS — Z862 Personal history of diseases of the blood and blood-forming organs and certain disorders involving the immune mechanism: Secondary | ICD-10-CM | POA: Insufficient documentation

## 2013-03-28 DIAGNOSIS — F1010 Alcohol abuse, uncomplicated: Secondary | ICD-10-CM | POA: Insufficient documentation

## 2013-03-28 DIAGNOSIS — R45851 Suicidal ideations: Secondary | ICD-10-CM | POA: Insufficient documentation

## 2013-03-28 HISTORY — DX: Malignant (primary) neoplasm, unspecified: C80.1

## 2013-03-28 LAB — CBC WITH DIFFERENTIAL/PLATELET
Basophils Absolute: 0 10*3/uL (ref 0.0–0.1)
Basophils Relative: 0 % (ref 0–1)
Eosinophils Relative: 2 % (ref 0–5)
HCT: 40.8 % (ref 36.0–46.0)
Lymphocytes Relative: 28 % (ref 12–46)
MCHC: 34.3 g/dL (ref 30.0–36.0)
MCV: 104.6 fL — ABNORMAL HIGH (ref 78.0–100.0)
Monocytes Absolute: 0.5 10*3/uL (ref 0.1–1.0)
Neutro Abs: 4.4 10*3/uL (ref 1.7–7.7)
Platelets: 211 10*3/uL (ref 150–400)
RDW: 13.5 % (ref 11.5–15.5)
WBC: 7 10*3/uL (ref 4.0–10.5)

## 2013-03-28 LAB — ACETAMINOPHEN LEVEL: Acetaminophen (Tylenol), Serum: <15 ug/mL (ref 10–30)

## 2013-03-28 LAB — RAPID URINE DRUG SCREEN, HOSP PERFORMED
Cocaine: NOT DETECTED
Opiates: NOT DETECTED

## 2013-03-28 LAB — BASIC METABOLIC PANEL
Calcium: 8.7 mg/dL (ref 8.4–10.5)
Creatinine, Ser: 0.54 mg/dL (ref 0.50–1.10)
GFR calc Af Amer: >90 mL/min (ref >90)
Sodium: 140 mEq/L (ref 135–145)

## 2013-03-28 LAB — SALICYLATE LEVEL: Salicylate Lvl: <2 mg/dL — ABNORMAL LOW (ref 2.8–20.0)

## 2013-03-28 MED ORDER — HALOPERIDOL 5 MG PO TABS
ORAL_TABLET | ORAL | Status: AC
  Administered 2013-03-28: 2.5 mg via ORAL
  Filled 2013-03-28: qty 1

## 2013-03-28 MED ORDER — HALOPERIDOL 5 MG PO TABS: 2.5000 mg | ORAL_TABLET | Freq: Once | ORAL | Status: AC

## 2013-03-28 NOTE — ED Provider Notes (Addendum)
History     CSN: [id number]  Arrival date & time 03/28/13  1449   First MD Initiated Contact with Patient 03/28/13 1507      Chief Complaint  Patient presents with  . V70.1    (Consider location/radiation/quality/duration/timing/severity/associated sxs/prior treatment) HPI Patient presents after he found somnolent, endorsing excessive intake of benzodiazepine. On my exam the patient is awake alert, appropriately oriented and interactive, though she is tearful. She states that she is a ongoing patient for bladder cancer, currently getting radiation therapy, recently had surgery. She also has a long history of depression, though no prior psychiatric admissions. She states that for the past few days she's had increasing despondency, overwhelming depression.  Today after her symptoms progressed she took multiple abdomen tablets.  The patient endorses taking a few, though this is unable to be confirmed. The patient was found outside of this facility, after she had previously come to visit the patient, who is an acquaintance of hers. Per report the patient was minimally responsive to pneumonia, other painful stimuli.  Past Medical History  Diagnosis Date  . Hypertension   . ADHD (attention deficit hyperactivity disorder)   . Anxiety   . SUI (stress urinary incontinence, female)   . History of anemia   . GERD (gastroesophageal reflux disease)   . Cutaneous lupus erythematosus   . Cancer     bladder    Past Surgical History  Procedure Laterality Date  . Cesarean section  12-23-2004     W/ LEFT TUBAL LIGATION AND RIGHT SALPINGECTOMY  . Cesarean section  1995  . Bladder suspension N/A 02/13/2013    Procedure: [facility] SLING;  Surgeon: Anner Crete, MD;  Location: [facility];  Service: Urology;  Laterality: N/A;  . Tubal ligation      No family history on file.  History  Substance Use Topics  . Smoking status: Current Every Day Smoker -- 0.50 packs/day for 20 years     Types: Cigarettes  . Smokeless tobacco: Never Used  . Alcohol Use: Yes     Comment: daily    OB History   Grav Para Term Preterm Abortions TAB SAB Ect Mult Living                  Review of Systems  All other systems reviewed and are negative.    Allergies  Vicodin  Home Medications   Current Outpatient Rx  Name  Route  Sig  Dispense  Refill  . Aspirin-Salicylamide-Caffeine (BC HEADACHE PO)   Oral   Take 1 packet by mouth every 4 (four) hours as needed. For pain         . ciprofloxacin (CIPRO) 500 MG tablet   Oral   Take 1 tablet (500 mg total) by mouth 2 (two) times daily.   6 tablet   0   . docusate sodium (COLACE) 100 MG capsule   Oral   Take 1 capsule (100 mg total) by mouth 2 (two) times daily.   60 capsule   2   . lisinopril (PRINIVIL,ZESTRIL) 20 MG tablet   Oral   Take 20 mg by mouth daily.         . pantoprazole (PROTONIX) 20 MG tablet   Oral   Take 20 mg by mouth every morning.          . progesterone (PROMETRIUM) 200 MG capsule   Oral   Take 200 mg by mouth daily.         Marland Kitchen  terbinafine (LAMISIL) 250 MG tablet   Oral   Take 250 mg by mouth daily.         . traMADol-acetaminophen (ULTRACET) 37.5-325 MG per tablet   Oral   Take 1 tablet by mouth every 6 (six) hours as needed for pain.   30 tablet   0     There were no vitals taken for this visit.  Physical Exam  Nursing note and vitals reviewed. Constitutional: She is oriented to person, place, and time. She appears well-developed and well-nourished. No distress.  HENT:  Head: Normocephalic and atraumatic.  Eyes: Conjunctivae and EOM are normal.  Pulmonary/Chest: Effort normal. No stridor. No respiratory distress.  Abdominal: She exhibits no distension.  Musculoskeletal: She exhibits no edema.  Neurological: She is alert and oriented to person, place, and time. No cranial nerve deficit.  Skin: Skin is warm and dry.  Psychiatric: Her behavior is normal. Judgment normal.  Her mood appears anxious. Her affect is labile. Her speech is rapid and/or pressured. Cognition and memory are normal. She exhibits a depressed mood. She expresses suicidal ideation.    ED Course  Procedures (including critical care time)  Labs Reviewed  CBC WITH DIFFERENTIAL  BASIC METABOLIC PANEL  URINE RAPID DRUG SCREEN (HOSP PERFORMED)  ETHANOL  ACETAMINOPHEN LEVEL  SALICYLATE LEVEL   No results found.   No diagnosis found.  Pulse ox 99% room air normal  Update: Patient is awake alert. I discussed the patient's case with our behavioral health team.  The patient is capable of contracting for safety, has a family member here with her. She is felt stable for discharge with outpatient psychiatric followup  The patient denies suicidal ideation, states that she wanted to sleep, prompting the Ativan ingestion. She had previously taken etoh   Date: 03/28/2013  Rate: 99  Rhythm: normal sinus rhythm  QRS Axis: normal  Intervals: normal  ST/T Wave abnormalities: normal  Conduction Disutrbances: none (Pwave borderline enlargement)  Narrative Interpretation: unremarkable    6:11 PM Patient calmer.  ETOH significantly elevated  8:14 PM Patient calm. MDM  This patient with history of bladder cancer, currently getting therapy, as well as a history of depression now presents after possible ingestion of significant amounts of Ativan and a possible suicide gesture.  On my exam the patient is awake alert, appropriately interactive, cheerful.  She seemingly, accurately recalls today's events, describes a history of new illness, significant life occurrences that all could be related to ongoing depression. The patient denies any suicidal ideation.  She contracted for safety with our behavioral health team, and was discharged with her husband to follow up with a psychiatrist as an outpatient.  Gerhard Munch, MD 03/28/13 1652  Gerhard Munch, MD 03/28/13 1654  Gerhard Munch,  MD 03/28/13 2014

## 2013-03-28 NOTE — ED Notes (Signed)
Patient tearful, repeating "please, please, help me" over and over. Husband at bedside. Denies needs at this time.

## 2013-03-28 NOTE — ED Notes (Signed)
Per pt's husband wanted me to wait for her vital sign reassessment at this time till it was convenient for him. [name]

## 2013-03-28 NOTE — ED Notes (Signed)
Pt was placed in scrubs at this time. Urine also obtained. [name]

## 2013-03-28 NOTE — BH Assessment (Signed)
Assessment Note   [name] is an 47 y.o. female. Pt reports she had not slept in 3 nights and this morning she took her normal meds and 4 Ativan 1 mg tabs to go to sleep.  She later got a text from her co-worker stating her pt was in the ed.  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 She drove to the ER and co-worker reported pt was here with overdose and pt was admitted to the er.  Pt denies this was a suicide attempt. She admits to being depressed and has not gotten a new psychiatrist since her psychiatrist move to another city.   She reports she would feel better if she could only get some sleep. Pt husband agreed to administer pt's medication if given prescription. Pt admitted to being confused since not getting sleep. Pt denies h/i and is not psychotic.  Pt contracted for safety and husband agrees to be with pt to keep her safe. Dr Lorraine Lax agrees with disposition.      Axis I: Major Depression, single episode Axis II: Deferred Axis III:  Past Medical History  Diagnosis Date  . Hypertension   . ADHD (attention deficit hyperactivity disorder)   . Anxiety   . SUI (stress urinary incontinence, female)   . History of anemia   . GERD (gastroesophageal reflux disease)   . Cutaneous lupus erythematosus   . Cancer     bladder   Axis IV: problems with access to health care services Axis V: 51-60 moderate symptoms  Past Medical History:  Past Medical History  Diagnosis Date  . Hypertension   . ADHD (attention deficit hyperactivity disorder)   . Anxiety   . SUI (stress urinary incontinence, female)   . History of anemia   . GERD (gastroesophageal reflux disease)   . Cutaneous lupus erythematosus   . Cancer     bladder    Past Surgical History  Procedure Laterality Date  . Cesarean section  12-23-2004     W/ LEFT TUBAL LIGATION AND RIGHT SALPINGECTOMY  . Cesarean section  1995  . Bladder suspension N/A  02/13/2013    Procedure: [facility] SLING;  Surgeon: Anner Crete, MD;  Location: [facility];  Service: Urology;  Laterality: N/A;  . Tubal ligation      Family History: No family history on file.  Social History:  reports that she has been smoking Cigarettes.  She has a 10 pack-year smoking history. She has never used smokeless tobacco. She reports that  drinks alcohol. She reports that she does not use illicit drugs.  Additional Social History:  Alcohol / Drug Use Pain Medications: na Prescriptions: na Over the Counter: na History of alcohol / drug use?: No history of alcohol / drug abuse  CIWA:   COWS:    Allergies:  Allergies  Allergen Reactions  . Vicodin (Hydrocodone-Acetaminophen) Nausea And Vomiting and Other (See Comments)    dizzy    Home Medications:  (Not in a hospital admission)  OB/GYN Status:  No LMP recorded. Patient is postmenopausal.  General Assessment Data Location of Assessment: [facility] ACT Assessment: Yes Living Arrangements: Spouse/significant other Can pt return to current living arrangement?: Yes Admission Status: Voluntary Is patient capable of signing voluntary admission?: Yes Transfer from: Acute Hospital Referral Source: MD     Risk to self Suicidal Ideation: No Suicidal Intent: No Is patient at  risk for suicide?: No Suicidal Plan?: No Access to Means: No What has been your use of drugs/alcohol within the last 12 months?: beer Previous Attempts/Gestures: No How many times?: 0 Other Self Harm Risks: na Triggers for Past Attempts: None known Intentional Self Injurious Behavior: None Family Suicide History: No Recent stressful life event(s): Recent negative physical changes (Bladder CA) Persecutory voices/beliefs?: No Depression: Yes Depression Symptoms: Despondent;Insomnia;Tearfulness Substance abuse history and/or treatment for substance abuse?: No Suicide prevention information given to non-admitted patients: Yes  Risk  to Others Homicidal Ideation: No Thoughts of Harm to Others: No Current Homicidal Intent: No Current Homicidal Plan: No Access to Homicidal Means: No Identified Victim: none History of harm to others?: No Assessment of Violence: None Noted Violent Behavior Description: na Does patient have access to weapons?: No Criminal Charges Pending?: No Does patient have a court date: No  Psychosis Hallucinations: None noted Delusions: None noted  Mental Status Report Appear/Hygiene: Improved Eye Contact: Good Motor Activity: Freedom of movement Speech: Logical/coherent Level of Consciousness: Alert Mood: Depressed Affect: Appropriate to circumstance;Depressed;Sad Anxiety Level: Minimal Thought Processes: Coherent;Relevant Judgement: Impaired Orientation: Person;Place;Time;Situation Obsessive Compulsive Thoughts/Behaviors: None  Cognitive Functioning Concentration: Decreased Memory: Recent Intact;Remote Intact IQ: Average Insight: Fair Impulse Control: Fair Appetite: Fair Weight Loss:  (unk) Weight Gain: 0 Sleep: Decreased Total Hours of Sleep: 2 (no sleep in past 3 nights) Vegetative Symptoms: None  ADLScreening [facility] Assessment Services) Patient's cognitive ability adequate to safely complete daily activities?: Yes Patient able to express need for assistance with ADLs?: Yes Independently performs ADLs?: Yes (appropriate for developmental age)  Abuse/Neglect [facility]) Physical Abuse: Denies Verbal Abuse: Denies Sexual Abuse: Denies  Prior Inpatient Therapy Prior Inpatient Therapy: No Prior Therapy Dates: denies Prior Therapy Facilty/Provider(s): denies Reason for Treatment: denies  Prior Outpatient Therapy Prior Outpatient Therapy: Yes Prior Therapy Dates: 2013 Prior Therapy Facilty/Provider(s): psychiatrist in [place] Reason for Treatment: depression  ADL Screening (condition at time of admission) Patient's cognitive ability adequate to safely complete daily  activities?: Yes Patient able to express need for assistance with ADLs?: Yes Independently performs ADLs?: Yes (appropriate for developmental age) Weakness of Legs: None Weakness of Arms/Hands: None  Home Assistive Devices/Equipment Home Assistive Devices/Equipment: None  Therapy Consults (therapy consults require a physician order) PT Evaluation Needed: No OT Evalulation Needed: No SLP Evaluation Needed: No Abuse/Neglect Assessment (Assessment to be complete while patient is alone) Physical Abuse: Denies Verbal Abuse: Denies Sexual Abuse: Denies Exploitation of patient/patient's resources: Denies Self-Neglect: Denies Values / Beliefs Cultural Requests During Hospitalization: None Spiritual Requests During Hospitalization: None Consults Spiritual Care Consult Needed: No Social Work Consult Needed: No Merchant navy officer (For Healthcare) Advance Directive: Patient does not have advance directive;Not applicable, patient <67 years old Pre-existing out of facility DNR order (yellow form or pink MOST form): No    Additional Information 1:1 In Past 12 Months?: No CIRT Risk: No Elopement Risk: No Does patient have medical clearance?: Yes     Disposition: [facility] and Dr Milagros Evener Disposition Initial Assessment Completed for this Encounter: Yes Disposition of Patient: Referred to ([facility] out pt & Dr Evelene Croon) Patient referred to: Other (Comment) ([facility] and dr Evelene Croon)  On Site Evaluation by:   Reviewed with Physician:     Hattie Perch Winford 03/28/2013 5:29 PM

## 2013-03-28 NOTE — ED Notes (Signed)
Late Note  Patient anxious and tearful, pulling at monitoring equipment and making statements that she is going to leave. Patient's husband attempted to convince patient to stay. Dr Jeraldine Loots made aware and in to speak with patient. Explained to patient that she is unsafe to go home right now due to her intoxication. Patient more willing to stay after speaking with MD. Patient repeating to "just put me to sleep". MD ordered 2.5 mg haldol PO for patient once. Patient took medication cooperatively. Awaiting dinner trays to arrive to department and patient made aware we will get her one as soon as they are available.

## 2013-03-28 NOTE — ED Notes (Signed)
Registration staff reported to charge rn that pt was sitting outside in a wheelchair, tearful, looking for keys.  Went to speak to pt and and pt had some slurred speech.  Pt refused to come inside.  Reports would stay in wheelchair.  Pt was wearing a visitor's sticker for room 8.  Spoke to visitors in room 8 and was told that pt had verbalized that she was very depressed, took a lot of ativans this morning, and she wanted to die.  Went back outside to pt and pt was laying in the grass.  Pt was responsive to ammonia inhalant.  Denies any pain.  Pt said she did not fall, says she just wanted to lay down and go to sleep.  Pt was lifted to stretcher and brought to ED.  Was told by visitors in room 8 that pt's husband was on the way.  Pt tearful, very upset about being treated, states "I just want to die."  Pt reports has bladder cancer.  Pt says she took a handful of 1mg  ativans at 0900 this morning.  Pt repeatedly states, "my husband is going to be so mad at me."

## 2013-03-28 NOTE — ED Notes (Signed)
Pt is agitated wanting to leave jerked leads off at this time. R.N. Aware of same. [name]

## 2013-04-04 ENCOUNTER — Inpatient Hospital Stay: Payer: Self-pay | Admitting: Psychiatry

## 2013-04-04 LAB — URINALYSIS, COMPLETE
Glucose,UR: NEGATIVE mg/dL (ref 0–75)
Ketone: NEGATIVE
Specific Gravity: 1.01 (ref 1.003–1.030)
Squamous Epithelial: 2
WBC UR: 3 /HPF (ref 0–5)

## 2013-04-04 LAB — DRUG SCREEN, URINE
Amphetamines, Ur Screen: NEGATIVE (ref ?–1000)
Benzodiazepine, Ur Scrn: NEGATIVE (ref ?–200)
Cannabinoid 50 Ng, Ur ~~LOC~~: NEGATIVE (ref ?–50)
MDMA (Ecstasy)Ur Screen: NEGATIVE (ref ?–500)
Phencyclidine (PCP) Ur S: NEGATIVE (ref ?–25)
Tricyclic, Ur Screen: NEGATIVE (ref ?–1000)

## 2013-04-04 LAB — CBC
HGB: 15 g/dL (ref 12.0–16.0)
MCV: 106 fL — ABNORMAL HIGH (ref 80–100)
Platelet: 238 10*3/uL (ref 150–440)
RBC: 4.2 10*6/uL (ref 3.80–5.20)
RDW: 15.1 % — ABNORMAL HIGH (ref 11.5–14.5)

## 2013-04-04 LAB — COMPREHENSIVE METABOLIC PANEL
Albumin: 3.9 g/dL (ref 3.4–5.0)
Anion Gap: 5 — ABNORMAL LOW (ref 7–16)
BUN: 3 mg/dL — ABNORMAL LOW (ref 7–18)
Calcium, Total: 8.7 mg/dL (ref 8.5–10.1)
Chloride: 107 mmol/L (ref 98–107)
Co2: 30 mmol/L (ref 21–32)
Creatinine: 0.57 mg/dL — ABNORMAL LOW (ref 0.60–1.30)
Potassium: 3.6 mmol/L (ref 3.5–5.1)
SGOT(AST): 111 U/L — ABNORMAL HIGH (ref 15–37)
Total Protein: 8 g/dL (ref 6.4–8.2)

## 2013-04-04 LAB — TSH: Thyroid Stimulating Horm: 0.609 u[IU]/mL

## 2013-04-04 LAB — ETHANOL: Ethanol: 281 mg/dL

## 2013-04-05 LAB — ETHANOL: Ethanol %: 0.19 % — ABNORMAL HIGH (ref 0.000–0.080)

## 2013-04-10 ENCOUNTER — Ambulatory Visit: Payer: Self-pay | Admitting: Psychiatry

## 2013-08-20 ENCOUNTER — Emergency Department: Payer: Self-pay | Admitting: Emergency Medicine

## 2013-08-20 LAB — ETHANOL
Ethanol %: 0.301 % (ref 0.000–0.080)
Ethanol: 301 mg/dL

## 2013-08-20 LAB — COMPREHENSIVE METABOLIC PANEL
Alkaline Phosphatase: 118 U/L (ref 50–136)
BUN: 11 mg/dL (ref 7–18)
Chloride: 109 mmol/L — ABNORMAL HIGH (ref 98–107)
Creatinine: 0.77 mg/dL (ref 0.60–1.30)
EGFR (African American): >60
EGFR (Non-African Amer.): >60
Glucose: 94 mg/dL (ref 65–99)
Osmolality: 284 (ref 275–301)
Potassium: 4.2 mmol/L (ref 3.5–5.1)
SGOT(AST): 32 U/L (ref 15–37)
SGPT (ALT): 29 U/L (ref 12–78)
Sodium: 143 mmol/L (ref 136–145)
Total Protein: 7.3 g/dL (ref 6.4–8.2)

## 2013-08-20 LAB — CBC
HCT: 47.4 % — ABNORMAL HIGH (ref 35.0–47.0)
HGB: 15.8 g/dL (ref 12.0–16.0)
MCHC: 33.4 g/dL (ref 32.0–36.0)
Platelet: 199 10*3/uL (ref 150–440)
RDW: 13.7 % (ref 11.5–14.5)
WBC: 8.9 10*3/uL (ref 3.6–11.0)

## 2013-08-21 LAB — DRUG SCREEN, URINE
Barbiturates, Ur Screen: NEGATIVE (ref ?–200)
Benzodiazepine, Ur Scrn: NEGATIVE (ref ?–200)
Cannabinoid 50 Ng, Ur ~~LOC~~: NEGATIVE (ref ?–50)
Cocaine Metabolite,Ur ~~LOC~~: NEGATIVE (ref ?–300)
Methadone, Ur Screen: NEGATIVE (ref ?–300)
Phencyclidine (PCP) Ur S: NEGATIVE (ref ?–25)
Tricyclic, Ur Screen: NEGATIVE (ref ?–1000)

## 2013-08-21 LAB — PREGNANCY, URINE: Pregnancy Test, Urine: NEGATIVE m[IU]/mL

## 2013-08-21 LAB — URINALYSIS, COMPLETE
Glucose,UR: NEGATIVE mg/dL (ref 0–75)
Hyaline Cast: 3
Ketone: NEGATIVE
Protein: NEGATIVE
Specific Gravity: 1.02 (ref 1.003–1.030)

## 2013-10-01 ENCOUNTER — Emergency Department: Payer: Self-pay | Admitting: Emergency Medicine

## 2013-10-01 LAB — CBC
HCT: 44.6 % (ref 35.0–47.0)
HGB: 15.1 g/dL (ref 12.0–16.0)
MCH: 32.7 pg (ref 26.0–34.0)
MCHC: 33.9 g/dL (ref 32.0–36.0)
MCV: 97 fL (ref 80–100)
WBC: 7.1 10*3/uL (ref 3.6–11.0)

## 2013-10-01 LAB — DRUG SCREEN, URINE
Amphetamines, Ur Screen: NEGATIVE (ref ?–1000)
Benzodiazepine, Ur Scrn: POSITIVE (ref ?–200)
Cannabinoid 50 Ng, Ur ~~LOC~~: NEGATIVE (ref ?–50)
Cocaine Metabolite,Ur ~~LOC~~: NEGATIVE (ref ?–300)
MDMA (Ecstasy)Ur Screen: NEGATIVE (ref ?–500)
Opiate, Ur Screen: NEGATIVE (ref ?–300)
Tricyclic, Ur Screen: NEGATIVE (ref ?–1000)

## 2013-10-01 LAB — COMPREHENSIVE METABOLIC PANEL
Albumin: 3.9 g/dL (ref 3.4–5.0)
Anion Gap: 5 — ABNORMAL LOW (ref 7–16)
BUN: 8 mg/dL (ref 7–18)
Bilirubin,Total: 0.4 mg/dL (ref 0.2–1.0)
Calcium, Total: 9 mg/dL (ref 8.5–10.1)
Chloride: 110 mmol/L — ABNORMAL HIGH (ref 98–107)
Co2: 26 mmol/L (ref 21–32)
EGFR (African American): >60
EGFR (Non-African Amer.): >60
Glucose: 101 mg/dL — ABNORMAL HIGH (ref 65–99)
Osmolality: 280 (ref 275–301)
Potassium: 4.5 mmol/L (ref 3.5–5.1)
SGPT (ALT): 22 U/L (ref 12–78)
Total Protein: 7.7 g/dL (ref 6.4–8.2)

## 2013-10-01 LAB — URINALYSIS, COMPLETE
Bacteria: NONE SEEN
Ketone: NEGATIVE
Nitrite: NEGATIVE
Protein: NEGATIVE
RBC,UR: 1 /HPF (ref 0–5)
Specific Gravity: 1.018 (ref 1.003–1.030)

## 2013-10-04 ENCOUNTER — Emergency Department: Payer: Self-pay | Admitting: Emergency Medicine

## 2013-10-05 LAB — CBC
HCT: 40.4 % (ref 35.0–47.0)
HGB: 13.8 g/dL (ref 12.0–16.0)
MCHC: 34.2 g/dL (ref 32.0–36.0)
MCV: 97 fL (ref 80–100)
RDW: 14.4 % (ref 11.5–14.5)
WBC: 9.3 10*3/uL (ref 3.6–11.0)

## 2013-10-05 LAB — COMPREHENSIVE METABOLIC PANEL
Anion Gap: 5 — ABNORMAL LOW (ref 7–16)
Bilirubin,Total: 0.2 mg/dL (ref 0.2–1.0)
Calcium, Total: 8 mg/dL — ABNORMAL LOW (ref 8.5–10.1)
Co2: 22 mmol/L (ref 21–32)
EGFR (Non-African Amer.): >60
Osmolality: 279 (ref 275–301)
SGOT(AST): 28 U/L (ref 15–37)
Sodium: 140 mmol/L (ref 136–145)
Total Protein: 6.7 g/dL (ref 6.4–8.2)

## 2013-10-05 LAB — ETHANOL: Ethanol: 143 mg/dL

## 2013-10-05 LAB — SALICYLATE LEVEL
Salicylates, Serum: 36.2 mg/dL
Salicylates, Serum: 29.8 mg/dL — ABNORMAL HIGH

## 2013-10-27 LAB — CBC
MCHC: 33.1 g/dL (ref 32.0–36.0)
MCV: 98 fL (ref 80–100)
Platelet: 179 10*3/uL (ref 150–440)
RDW: 14.9 % — ABNORMAL HIGH (ref 11.5–14.5)
WBC: 5.5 10*3/uL (ref 3.6–11.0)

## 2013-10-27 LAB — DRUG SCREEN, URINE
Cannabinoid 50 Ng, Ur ~~LOC~~: NEGATIVE (ref ?–50)
MDMA (Ecstasy)Ur Screen: NEGATIVE (ref ?–500)
Methadone, Ur Screen: NEGATIVE (ref ?–300)

## 2013-10-27 LAB — COMPREHENSIVE METABOLIC PANEL
Albumin: 3.7 g/dL (ref 3.4–5.0)
Alkaline Phosphatase: 116 U/L
BUN: 8 mg/dL (ref 7–18)
Bilirubin,Total: 0.3 mg/dL (ref 0.2–1.0)
Chloride: 106 mmol/L (ref 98–107)
Creatinine: 0.51 mg/dL — ABNORMAL LOW (ref 0.60–1.30)
EGFR (African American): >60
Osmolality: 280 (ref 275–301)
SGOT(AST): 21 U/L (ref 15–37)
SGPT (ALT): 15 U/L (ref 12–78)
Total Protein: 6.9 g/dL (ref 6.4–8.2)

## 2013-10-27 LAB — URINALYSIS, COMPLETE
Bilirubin,UR: NEGATIVE
Ketone: NEGATIVE
Nitrite: NEGATIVE
Ph: 6 (ref 4.5–8.0)
Squamous Epithelial: NONE SEEN

## 2013-10-27 LAB — SALICYLATE LEVEL: Salicylates, Serum: 11.6 mg/dL — ABNORMAL HIGH

## 2013-10-27 LAB — TROPONIN I: Troponin-I: <0.02 ng/mL

## 2013-10-27 LAB — ACETAMINOPHEN LEVEL: Acetaminophen: <2 ug/mL

## 2013-10-28 ENCOUNTER — Inpatient Hospital Stay: Payer: Self-pay | Admitting: Psychiatry

## 2013-10-28 LAB — SALICYLATE LEVEL: Salicylates, Serum: 4.2 mg/dL — ABNORMAL HIGH

## 2014-03-10 ENCOUNTER — Emergency Department (HOSPITAL_COMMUNITY)
Admission: EM | Admit: 2014-03-10 | Discharge: 2014-03-11 | Disposition: A | Discharge status: Home / Self Care | Payer: 59 | Attending: Emergency Medicine | Admitting: Emergency Medicine

## 2014-03-10 ENCOUNTER — Encounter (HOSPITAL_COMMUNITY): Payer: Self-pay | Admitting: Emergency Medicine

## 2014-03-10 DIAGNOSIS — Z8551 Personal history of malignant neoplasm of bladder: Secondary | ICD-10-CM | POA: Insufficient documentation

## 2014-03-10 DIAGNOSIS — Z872 Personal history of diseases of the skin and subcutaneous tissue: Secondary | ICD-10-CM | POA: Insufficient documentation

## 2014-03-10 DIAGNOSIS — Z862 Personal history of diseases of the blood and blood-forming organs and certain disorders involving the immune mechanism: Secondary | ICD-10-CM | POA: Insufficient documentation

## 2014-03-10 DIAGNOSIS — I10 Essential (primary) hypertension: Secondary | ICD-10-CM | POA: Insufficient documentation

## 2014-03-10 DIAGNOSIS — F411 Generalized anxiety disorder: Secondary | ICD-10-CM | POA: Insufficient documentation

## 2014-03-10 DIAGNOSIS — F1010 Alcohol abuse, uncomplicated: Secondary | ICD-10-CM | POA: Insufficient documentation

## 2014-03-10 DIAGNOSIS — Z79899 Other long term (current) drug therapy: Secondary | ICD-10-CM | POA: Insufficient documentation

## 2014-03-10 DIAGNOSIS — F10929 Alcohol use, unspecified with intoxication, unspecified: Secondary | ICD-10-CM

## 2014-03-10 DIAGNOSIS — Z8719 Personal history of other diseases of the digestive system: Secondary | ICD-10-CM | POA: Insufficient documentation

## 2014-03-10 DIAGNOSIS — R45851 Suicidal ideations: Secondary | ICD-10-CM | POA: Insufficient documentation

## 2014-03-10 DIAGNOSIS — F17200 Nicotine dependence, unspecified, uncomplicated: Secondary | ICD-10-CM | POA: Insufficient documentation

## 2014-03-10 DIAGNOSIS — Z8742 Personal history of other diseases of the female genital tract: Secondary | ICD-10-CM | POA: Insufficient documentation

## 2014-03-10 LAB — CBC
HCT: 45.6 % (ref 36.0–46.0)
Hemoglobin: 15.4 g/dL — ABNORMAL HIGH (ref 12.0–15.0)
MCH: 34.1 pg — AB (ref 26.0–34.0)
MCHC: 33.8 g/dL (ref 30.0–36.0)
MCV: 101.1 fL — AB (ref 78.0–100.0)
PLATELETS: 193 10*3/uL (ref 150–400)
RBC: 4.51 MIL/uL (ref 3.87–5.11)
RDW: 13.9 % (ref 11.5–15.5)
WBC: 6.5 10*3/uL (ref 4.0–10.5)

## 2014-03-10 NOTE — ED Notes (Signed)
Trained sitter at bedside. 

## 2014-03-10 NOTE — ED Notes (Signed)
AC notified for sitter.

## 2014-03-10 NOTE — ED Notes (Signed)
Security paged, on the way to wand patient.

## 2014-03-10 NOTE — ED Notes (Signed)
Per Patient: Patient reports she needs Detox from alcohol. Reports her last drink was tonight around 1930 tonight (Mouthwash). Pt report she does have SI, and has a plan to hang her self in her garage. Pt currently ax4, NAD. Pt tearful in triage. Pt denies Suicide attempts in the past.

## 2014-03-11 LAB — RAPID URINE DRUG SCREEN, HOSP PERFORMED
AMPHETAMINES: NOT DETECTED
Barbiturates: NOT DETECTED
Benzodiazepines: NOT DETECTED
COCAINE: NOT DETECTED
OPIATES: NOT DETECTED
Tetrahydrocannabinol: NOT DETECTED

## 2014-03-11 LAB — COMPREHENSIVE METABOLIC PANEL
ALT: 14 U/L (ref 0–35)
AST: 21 U/L (ref 0–37)
Albumin: 3.8 g/dL (ref 3.5–5.2)
Alkaline Phosphatase: 99 U/L (ref 39–117)
BUN: 10 mg/dL (ref 6–23)
CALCIUM: 8.7 mg/dL (ref 8.4–10.5)
CO2: 19 meq/L (ref 19–32)
CREATININE: 0.6 mg/dL (ref 0.50–1.10)
Chloride: 105 mEq/L (ref 96–112)
Glucose, Bld: 82 mg/dL (ref 70–99)
Potassium: 3.9 mEq/L (ref 3.7–5.3)
SODIUM: 142 meq/L (ref 137–147)
TOTAL PROTEIN: 7.2 g/dL (ref 6.0–8.3)
Total Bilirubin: 0.2 mg/dL — ABNORMAL LOW (ref 0.3–1.2)

## 2014-03-11 LAB — ACETAMINOPHEN LEVEL: Acetaminophen (Tylenol), Serum: <15 ug/mL (ref 10–30)

## 2014-03-11 LAB — ETHANOL: Alcohol, Ethyl (B): 243 mg/dL — ABNORMAL HIGH (ref 0–11)

## 2014-03-11 LAB — SALICYLATE LEVEL: SALICYLATE LVL: 6.7 mg/dL (ref 2.8–20.0)

## 2014-03-11 NOTE — Discharge Instructions (Signed)
Alcohol Intoxication °Alcohol intoxication occurs when the amount of alcohol that a person has consumed impairs his or her ability to mentally and physically function. Alcohol directly impairs the normal chemical activity of the brain. Drinking large amounts of alcohol can lead to changes in mental function and behavior, and it can cause many physical effects that can be harmful.  °Alcohol intoxication can range in severity from mild to very severe. Various factors can affect the level of intoxication that occurs, such as the person's age, gender, weight, frequency of alcohol consumption, and the presence of other medical conditions (such as diabetes, seizures, or heart conditions). Dangerous levels of alcohol intoxication may occur when people drink large amounts of alcohol in a short period (binge drinking). Alcohol can also be especially dangerous when combined with certain prescription medicines or "recreational" drugs. °SIGNS AND SYMPTOMS °Some common signs and symptoms of mild alcohol intoxication include: °· Loss of coordination. °· Changes in mood and behavior. °· Impaired judgment. °· Slurred speech. °As alcohol intoxication progresses to more severe levels, other signs and symptoms will appear. These may include: °· Vomiting. °· Confusion and impaired memory. °· Slowed breathing. °· Seizures. °· Loss of consciousness. °DIAGNOSIS  °Your health care provider will take a medical history and perform a physical exam. You will be asked about the amount and type of alcohol you have consumed. Blood tests will be done to measure the concentration of alcohol in your blood. In many places, your blood alcohol level must be lower than 80 mg/dL (0.08%) to legally drive. However, many dangerous effects of alcohol can occur at much lower levels.  °TREATMENT  °People with alcohol intoxication often do not require treatment. Most of the effects of alcohol intoxication are temporary, and they go away as the alcohol naturally  leaves the body. Your health care provider will monitor your condition until you are stable enough to go home. Fluids are sometimes given through an IV access tube to help prevent dehydration.  °HOME CARE INSTRUCTIONS °· Do not drive after drinking alcohol. °· Stay hydrated. Drink enough water and fluids to keep your urine clear or pale yellow. Avoid caffeine.   °· Only take over-the-counter or prescription medicines as directed by your health care provider.   °SEEK MEDICAL CARE IF:  °· You have persistent vomiting.   °· You do not feel better after a few days. °· You have frequent alcohol intoxication. Your health care provider can help determine if you should see a substance use treatment counselor. °SEEK IMMEDIATE MEDICAL CARE IF:  °· You become shaky or tremble when you try to stop drinking.   °· You shake uncontrollably (seizure).   °· You throw up (vomit) blood. This may be bright red or may look like black coffee grounds.   °· You have blood in your stool. This may be bright red or may appear as a black, tarry, bad smelling stool.   °· You become lightheaded or faint.   °MAKE SURE YOU:  °· Understand these instructions. °· Will watch your condition. °· Will get help right away if you are not doing well or get worse. °Document Released: 08/30/2005 Document Revised: 07/23/2013 Document Reviewed: 04/25/2013 °[employer]® Patient Information ©2014 [employer]. ° °
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
## 2014-03-11 NOTE — ED Provider Notes (Signed)
CSN: [id number]     Arrival date & time 03/10/14  2200 History   None    Chief Complaint  Patient presents with  . Alcohol Problem  . 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 Patient says she felt suicidal because she relapsed. She notes multiple family stressors - please see detailed RN note.    The patient's feeling of SI had resolved by the time I evaluated her around 0130. She has a sponsor in [employer]. She has no physical complaints.   Past Medical History  Diagnosis Date  . Hypertension   . ADHD (attention deficit hyperactivity disorder)   . Anxiety   . SUI (stress urinary incontinence, female)   . History of anemia   . GERD (gastroesophageal reflux disease)   . Cutaneous lupus erythematosus   . Cancer     bladder   Past Surgical History  Procedure Laterality Date  . Cesarean section  12-23-2004     W/ LEFT TUBAL LIGATION AND RIGHT SALPINGECTOMY  . Cesarean section  1995  . Bladder suspension N/A 02/13/2013    Procedure: [facility];  Surgeon: Malka So, MD;  Location: [facility];  Service: Urology;  Laterality: N/A;  . Tubal ligation     No family history on file. History  Substance Use Topics  . Smoking status: Current Every Day Smoker -- 0.50 packs/day for 20 years    Types: Cigarettes  . Smokeless tobacco: Never Used  . Alcohol Use: Yes     Comment: daily   OB History   Grav Para Term Preterm Abortions TAB SAB Ect Mult Living                 Review of Systems  Ten point review of symptoms performed and is negative with the exception of symptoms noted above.    Allergies  Oxycodone and Vicodin  Home Medications   Current Outpatient Rx  Name  Route  Sig  Dispense  Refill  . Aspirin-Salicylamide-Caffeine (BC HEADACHE PO)   Oral   Take 1  packet by mouth every 6 (six) hours as needed (for headache or pain). For pain         . escitalopram (LEXAPRO) 20 MG tablet   Oral   Take 20 mg by mouth daily.         . progesterone (PROMETRIUM) 200 MG capsule   Oral   Take 200 mg by mouth daily.          BP 106/69  Pulse 77  Temp(Src) 98.1 F (36.7 C) (Oral)  Resp 18  SpO2 95% Physical Exam Gen: well developed and well nourished appearing Head: NCAT Eyes: PERL, EOMI Nose: no epistaixis or rhinorrhea Mouth/throat: mucosa is moist and pink Neck: supple, no stridor Lungs: CTA B, no wheezing, rhonchi or rales CV: RRR, no murmur, extremities appear well perfused.  Abd: soft, notender, nondistended Back: no ttp, no cva ttp Skin: warm and dry Ext: normal to inspection, no dependent edema Neuro: CN ii-xii grossly intact, no focal deficits Psyche; slightly tearful affect,  calm and cooperative.   ED Course  Procedures (including critical care time) Labs Review  Results for orders placed during the hospital encounter of 03/10/14 (from the past 24 hour(s))  ACETAMINOPHEN LEVEL     Status: None   Collection Time    03/10/14 11:28 PM  Result Value Ref Range   Acetaminophen (Tylenol), Serum <15.0  10 - 30 ug/mL  CBC     Status: Abnormal   Collection Time    03/10/14 11:28 PM      Result Value Ref Range   WBC 6.5  4.0 - 10.5 K/uL   RBC 4.51  3.87 - 5.11 MIL/uL   Hemoglobin 15.4 (*) 12.0 - 15.0 g/dL   HCT 45.6  36.0 - 46.0 %   MCV 101.1 (*) 78.0 - 100.0 fL   MCH 34.1 (*) 26.0 - 34.0 pg   MCHC 33.8  30.0 - 36.0 g/dL   RDW 13.9  11.5 - 15.5 %   Platelets 193  150 - 400 K/uL  COMPREHENSIVE METABOLIC PANEL     Status: Abnormal   Collection Time    03/10/14 11:28 PM      Result Value Ref Range   Sodium 142  137 - 147 mEq/L   Potassium 3.9  3.7 - 5.3 mEq/L   Chloride 105  96 - 112 mEq/L   CO2 19  19 - 32 mEq/L   Glucose, Bld 82  70 - 99 mg/dL   BUN 10  6 - 23 mg/dL   Creatinine, Ser 0.60  0.50 - 1.10 mg/dL    Calcium 8.7  8.4 - 10.5 mg/dL   Total Protein 7.2  6.0 - 8.3 g/dL   Albumin 3.8  3.5 - 5.2 g/dL   AST 21  0 - 37 U/L   ALT 14  0 - 35 U/L   Alkaline Phosphatase 99  39 - 117 U/L   Total Bilirubin 0.2 (*) 0.3 - 1.2 mg/dL   GFR calc non Af Amer >90  >90 mL/min   GFR calc Af Amer >90  >90 mL/min  ETHANOL     Status: Abnormal   Collection Time    03/10/14 11:28 PM      Result Value Ref Range   Alcohol, Ethyl (B) 243 (*) 0 - 11 mg/dL  SALICYLATE LEVEL     Status: None   Collection Time    03/10/14 11:28 PM      Result Value Ref Range   Salicylate Lvl 6.7  2.8 - 20.0 mg/dL  URINE RAPID DRUG SCREEN (HOSP PERFORMED)     Status: None   Collection Time    03/10/14 11:44 PM      Result Value Ref Range   Opiates NONE DETECTED  NONE DETECTED   Cocaine NONE DETECTED  NONE DETECTED   Benzodiazepines NONE DETECTED  NONE DETECTED   Amphetamines NONE DETECTED  NONE DETECTED   Tetrahydrocannabinol NONE DETECTED  NONE DETECTED   Barbiturates NONE DETECTED  NONE DETECTED     MDM   Patient with alcohol intoxication on arrival. Now clinically sober. Brief feelings of SI secondary to remorse over relapse have gone away. Patient has plan to attend an 8am AA meeting and has an appt with her intensive outpatient therapist at [place]. She is stable for d/c at this time.    Elyn Peers, MD 03/11/14 [phone]

## 2014-04-09 ENCOUNTER — Ambulatory Visit: Payer: Self-pay | Admitting: Orthopedic Surgery

## 2014-04-28 ENCOUNTER — Encounter (HOSPITAL_COMMUNITY): Payer: 59 | Admitting: Anesthesiology

## 2014-04-28 ENCOUNTER — Inpatient Hospital Stay (HOSPITAL_COMMUNITY): Payer: 59

## 2014-04-28 ENCOUNTER — Encounter (HOSPITAL_COMMUNITY)
Admission: RE | Disposition: A | Discharge status: Home / Self Care | Payer: Self-pay | Source: Ambulatory Visit | Attending: Orthopedic Surgery

## 2014-04-28 ENCOUNTER — Inpatient Hospital Stay (HOSPITAL_COMMUNITY): Payer: 59 | Admitting: Anesthesiology

## 2014-04-28 ENCOUNTER — Encounter (HOSPITAL_COMMUNITY): Payer: Self-pay | Admitting: General Practice

## 2014-04-28 ENCOUNTER — Observation Stay (HOSPITAL_COMMUNITY): Payer: 59

## 2014-04-28 ENCOUNTER — Observation Stay (HOSPITAL_COMMUNITY)
Admission: RE | Admit: 2014-04-28 | Discharge: 2014-04-29 | Disposition: A | Discharge status: Home / Self Care | Payer: 59 | Source: Ambulatory Visit | Attending: Orthopedic Surgery | Admitting: Orthopedic Surgery

## 2014-04-28 DIAGNOSIS — K219 Gastro-esophageal reflux disease without esophagitis: Secondary | ICD-10-CM | POA: Insufficient documentation

## 2014-04-28 DIAGNOSIS — L930 Discoid lupus erythematosus: Secondary | ICD-10-CM | POA: Insufficient documentation

## 2014-04-28 DIAGNOSIS — IMO0002 Reserved for concepts with insufficient information to code with codable children: Principal | ICD-10-CM | POA: Insufficient documentation

## 2014-04-28 DIAGNOSIS — S42209K Unspecified fracture of upper end of unspecified humerus, subsequent encounter for fracture with nonunion: Secondary | ICD-10-CM | POA: Diagnosis present

## 2014-04-28 DIAGNOSIS — F411 Generalized anxiety disorder: Secondary | ICD-10-CM | POA: Insufficient documentation

## 2014-04-28 DIAGNOSIS — N393 Stress incontinence (female) (male): Secondary | ICD-10-CM | POA: Insufficient documentation

## 2014-04-28 DIAGNOSIS — I10 Essential (primary) hypertension: Secondary | ICD-10-CM | POA: Insufficient documentation

## 2014-04-28 DIAGNOSIS — F909 Attention-deficit hyperactivity disorder, unspecified type: Secondary | ICD-10-CM | POA: Insufficient documentation

## 2014-04-28 DIAGNOSIS — F1020 Alcohol dependence, uncomplicated: Secondary | ICD-10-CM | POA: Insufficient documentation

## 2014-04-28 DIAGNOSIS — F17200 Nicotine dependence, unspecified, uncomplicated: Secondary | ICD-10-CM | POA: Insufficient documentation

## 2014-04-28 DIAGNOSIS — D649 Anemia, unspecified: Secondary | ICD-10-CM | POA: Insufficient documentation

## 2014-04-28 HISTORY — PX: ORIF PROXIMAL HUMERUS FRACTURE: SUR951

## 2014-04-28 HISTORY — DX: Anemia, unspecified: D64.9

## 2014-04-28 HISTORY — DX: Unspecified fracture of upper end of unspecified humerus, subsequent encounter for fracture with nonunion: S42.209K

## 2014-04-28 HISTORY — PX: ORIF HUMERUS FRACTURE: SHX2126

## 2014-04-28 LAB — CBC
HEMATOCRIT: 43.6 % (ref 36.0–46.0)
HEMOGLOBIN: 14.7 g/dL (ref 12.0–15.0)
MCH: 34.4 pg — AB (ref 26.0–34.0)
MCHC: 33.7 g/dL (ref 30.0–36.0)
MCV: 102.1 fL — ABNORMAL HIGH (ref 78.0–100.0)
Platelets: 122 10*3/uL — ABNORMAL LOW (ref 150–400)
RBC: 4.27 MIL/uL (ref 3.87–5.11)
RDW: 13.1 % (ref 11.5–15.5)
WBC: 6.4 10*3/uL (ref 4.0–10.5)

## 2014-04-28 LAB — BASIC METABOLIC PANEL
BUN: 11 mg/dL (ref 6–23)
CHLORIDE: 99 meq/L (ref 96–112)
CO2: 26 mEq/L (ref 19–32)
CREATININE: 0.66 mg/dL (ref 0.50–1.10)
Calcium: 9.5 mg/dL (ref 8.4–10.5)
GFR calc Af Amer: >90 mL/min (ref >90)
GFR calc non Af Amer: >90 mL/min (ref >90)
GLUCOSE: 105 mg/dL — AB (ref 70–99)
POTASSIUM: 4.3 meq/L (ref 3.7–5.3)
Sodium: 138 mEq/L (ref 137–147)

## 2014-04-28 SURGERY — OPEN REDUCTION INTERNAL FIXATION (ORIF) PROXIMAL HUMERUS FRACTURE
Anesthesia: Regional | Site: Shoulder | Laterality: Right

## 2014-04-28 MED ORDER — OXYCODONE HCL 5 MG/5ML PO SOLN: 5.0000 mg | Freq: Once | ORAL | Status: DC | PRN

## 2014-04-28 MED ORDER — HYDROMORPHONE HCL PF 1 MG/ML IJ SOLN
0.5000 mg | INTRAMUSCULAR | Status: DC | PRN
  Administered 2014-04-28 – 2014-04-29 (×5): 0.5 mg via INTRAVENOUS
  Filled 2014-04-28 (×5): qty 1

## 2014-04-28 MED ORDER — LIDOCAINE HCL (CARDIAC) 20 MG/ML IV SOLN
INTRAVENOUS | Status: DC | PRN
  Administered 2014-04-28: 80 mg via INTRAVENOUS

## 2014-04-28 MED ORDER — LACTATED RINGERS IV SOLN
INTRAVENOUS | Status: DC
  Administered 2014-04-28 (×2): via INTRAVENOUS

## 2014-04-28 MED ORDER — HYDROMORPHONE HCL 2 MG PO TABS
2.0000 mg | ORAL_TABLET | ORAL | Status: DC | PRN
  Administered 2014-04-28: 4 mg via ORAL
  Administered 2014-04-29 (×3): 6 mg via ORAL
  Filled 2014-04-28 (×2): qty 3
  Filled 2014-04-28: qty 2
  Filled 2014-04-28: qty 3

## 2014-04-28 MED ORDER — METOCLOPRAMIDE HCL 5 MG/ML IJ SOLN
5.0000 mg | Freq: Three times a day (TID) | INTRAMUSCULAR | Status: DC | PRN
  Filled 2014-04-28: qty 2

## 2014-04-28 MED ORDER — ASPIRIN EC 325 MG PO TBEC
325.0000 mg | DELAYED_RELEASE_TABLET | Freq: Two times a day (BID) | ORAL | Status: DC
  Administered 2014-04-28 – 2014-04-29 (×2): 325 mg via ORAL
  Filled 2014-04-28 (×4): qty 1

## 2014-04-28 MED ORDER — BISACODYL 10 MG RE SUPP: 10.0000 mg | Freq: Every day | RECTAL | Status: DC | PRN

## 2014-04-28 MED ORDER — PROGESTERONE MICRONIZED 200 MG PO CAPS
200.0000 mg | ORAL_CAPSULE | Freq: Every day | ORAL | Status: DC
  Administered 2014-04-29: 200 mg via ORAL
  Filled 2014-04-28 (×2): qty 1

## 2014-04-28 MED ORDER — DIPHENHYDRAMINE HCL 12.5 MG/5ML PO ELIX
12.5000 mg | ORAL_SOLUTION | ORAL | Status: DC | PRN
  Administered 2014-04-29: 25 mg via ORAL
  Filled 2014-04-28: qty 10

## 2014-04-28 MED ORDER — BUPIVACAINE-EPINEPHRINE (PF) 0.25% -1:200000 IJ SOLN
INTRAMUSCULAR | Status: AC
  Filled 2014-04-28: qty 30

## 2014-04-28 MED ORDER — BUPIVACAINE-EPINEPHRINE (PF) 0.5% -1:200000 IJ SOLN
INTRAMUSCULAR | Status: DC | PRN
  Administered 2014-04-28: 30 mL

## 2014-04-28 MED ORDER — HYDROCODONE-ACETAMINOPHEN 5-325 MG PO TABS: 1.0000 | ORAL_TABLET | ORAL | Status: DC | PRN

## 2014-04-28 MED ORDER — MIDAZOLAM HCL 2 MG/2ML IJ SOLN
INTRAMUSCULAR | Status: AC
  Administered 2014-04-28: 2 mg
  Filled 2014-04-28: qty 2

## 2014-04-28 MED ORDER — FENTANYL CITRATE 0.05 MG/ML IJ SOLN
INTRAMUSCULAR | Status: AC
  Administered 2014-04-28: 100 ug
  Filled 2014-04-28: qty 2

## 2014-04-28 MED ORDER — FLEET ENEMA 7-19 GM/118ML RE ENEM
1.0000 | ENEMA | Freq: Once | RECTAL | Status: AC | PRN
  Filled 2014-04-28: qty 1

## 2014-04-28 MED ORDER — SODIUM CHLORIDE 0.9 % IV SOLN
0.5000 mg/h | INTRAVENOUS | Status: DC
  Filled 2014-04-28: qty 5

## 2014-04-28 MED ORDER — SODIUM CHLORIDE 0.9 % IV SOLN
INTRAVENOUS | Status: DC
  Administered 2014-04-28 – 2014-04-29 (×2): via INTRAVENOUS

## 2014-04-28 MED ORDER — FENTANYL CITRATE 0.05 MG/ML IJ SOLN
INTRAMUSCULAR | Status: DC | PRN
  Administered 2014-04-28: 100 ug via INTRAVENOUS
  Administered 2014-04-28: 150 ug via INTRAVENOUS

## 2014-04-28 MED ORDER — CEFAZOLIN SODIUM 1-5 GM-% IV SOLN
1.0000 g | Freq: Four times a day (QID) | INTRAVENOUS | Status: AC
  Administered 2014-04-28 – 2014-04-29 (×3): 1 g via INTRAVENOUS
  Filled 2014-04-28 (×3): qty 50

## 2014-04-28 MED ORDER — ONDANSETRON HCL 4 MG/2ML IJ SOLN
INTRAMUSCULAR | Status: DC | PRN
  Administered 2014-04-28: 4 mg via INTRAVENOUS

## 2014-04-28 MED ORDER — MENTHOL 3 MG MT LOZG: 1.0000 | LOZENGE | OROMUCOSAL | Status: DC | PRN

## 2014-04-28 MED ORDER — PHENYLEPHRINE HCL 10 MG/ML IJ SOLN
INTRAMUSCULAR | Status: DC | PRN
  Administered 2014-04-28: 40 ug via INTRAVENOUS
  Administered 2014-04-28 (×3): 80 ug via INTRAVENOUS
  Administered 2014-04-28: 120 ug via INTRAVENOUS
  Administered 2014-04-28: 40 ug via INTRAVENOUS

## 2014-04-28 MED ORDER — OXYCODONE HCL 5 MG PO TABS: 5.0000 mg | ORAL_TABLET | Freq: Once | ORAL | Status: DC | PRN

## 2014-04-28 MED ORDER — DOCUSATE SODIUM 100 MG PO CAPS
100.0000 mg | ORAL_CAPSULE | Freq: Two times a day (BID) | ORAL | Status: DC
  Administered 2014-04-28 – 2014-04-29 (×2): 100 mg via ORAL
  Filled 2014-04-28 (×2): qty 1

## 2014-04-28 MED ORDER — MIDAZOLAM HCL 5 MG/5ML IJ SOLN
INTRAMUSCULAR | Status: DC | PRN
  Administered 2014-04-28: 2 mg via INTRAVENOUS

## 2014-04-28 MED ORDER — ALBUTEROL SULFATE HFA 108 (90 BASE) MCG/ACT IN AERS
INHALATION_SPRAY | RESPIRATORY_TRACT | Status: DC | PRN
  Administered 2014-04-28: 2 via RESPIRATORY_TRACT

## 2014-04-28 MED ORDER — ONDANSETRON HCL 4 MG PO TABS: 4.0000 mg | ORAL_TABLET | Freq: Four times a day (QID) | ORAL | Status: DC | PRN

## 2014-04-28 MED ORDER — 0.9 % SODIUM CHLORIDE (POUR BTL) OPTIME
TOPICAL | Status: DC | PRN
  Administered 2014-04-28: 1000 mL

## 2014-04-28 MED ORDER — ROCURONIUM BROMIDE 100 MG/10ML IV SOLN
INTRAVENOUS | Status: DC | PRN
  Administered 2014-04-28: 50 mg via INTRAVENOUS

## 2014-04-28 MED ORDER — HYDROMORPHONE HCL PF 1 MG/ML IJ SOLN: 0.2500 mg | INTRAMUSCULAR | Status: DC | PRN

## 2014-04-28 MED ORDER — ONDANSETRON HCL 4 MG/2ML IJ SOLN
4.0000 mg | Freq: Four times a day (QID) | INTRAMUSCULAR | Status: DC | PRN
Start: 2014-04-28 — End: 2014-04-29
  Administered 2014-04-29: 4 mg via INTRAVENOUS
  Filled 2014-04-28 (×2): qty 2

## 2014-04-28 MED ORDER — PHENOL 1.4 % MT LIQD: 1.0000 | OROMUCOSAL | Status: DC | PRN

## 2014-04-28 MED ORDER — ONDANSETRON HCL 4 MG/2ML IJ SOLN: 4.0000 mg | Freq: Four times a day (QID) | INTRAMUSCULAR | Status: DC | PRN

## 2014-04-28 MED ORDER — ESCITALOPRAM OXALATE 20 MG PO TABS
20.0000 mg | ORAL_TABLET | Freq: Every day | ORAL | Status: DC
  Administered 2014-04-29: 20 mg via ORAL
  Filled 2014-04-28: qty 1

## 2014-04-28 MED ORDER — BUPIVACAINE-EPINEPHRINE (PF) 0.5% -1:200000 IJ SOLN
INTRAMUSCULAR | Status: AC
  Filled 2014-04-28: qty 30

## 2014-04-28 MED ORDER — PROPOFOL 10 MG/ML IV BOLUS
INTRAVENOUS | Status: DC | PRN
  Administered 2014-04-28: 50 mg via INTRAVENOUS
  Administered 2014-04-28: 150 mg via INTRAVENOUS

## 2014-04-28 MED ORDER — ACETAMINOPHEN 650 MG RE SUPP
650.0000 mg | Freq: Four times a day (QID) | RECTAL | Status: DC | PRN
Start: 2014-04-28 — End: 2014-04-29

## 2014-04-28 MED ORDER — GLYCOPYRROLATE 0.2 MG/ML IJ SOLN
INTRAMUSCULAR | Status: DC | PRN
  Administered 2014-04-28: .4 mg via INTRAVENOUS

## 2014-04-28 MED ORDER — NEOSTIGMINE METHYLSULFATE 10 MG/10ML IV SOLN
INTRAVENOUS | Status: DC | PRN
  Administered 2014-04-28: 3 mg via INTRAVENOUS

## 2014-04-28 MED ORDER — EPHEDRINE SULFATE 50 MG/ML IJ SOLN
INTRAMUSCULAR | Status: DC | PRN
  Administered 2014-04-28: 10 mg via INTRAVENOUS
  Administered 2014-04-28: 5 mg via INTRAVENOUS

## 2014-04-28 MED ORDER — ALUMINUM HYDROXIDE GEL 320 MG/5ML PO SUSP
15.0000 mL | ORAL | Status: DC | PRN
  Filled 2014-04-28: qty 30

## 2014-04-28 MED ORDER — POLYETHYLENE GLYCOL 3350 17 G PO PACK: 17.0000 g | PACK | Freq: Every day | ORAL | Status: DC | PRN

## 2014-04-28 MED ORDER — ACETAMINOPHEN 325 MG PO TABS
650.0000 mg | ORAL_TABLET | Freq: Four times a day (QID) | ORAL | Status: DC | PRN
Start: 2014-04-28 — End: 2014-04-29

## 2014-04-28 MED ORDER — METOCLOPRAMIDE HCL 5 MG PO TABS
5.0000 mg | ORAL_TABLET | Freq: Three times a day (TID) | ORAL | Status: DC | PRN
  Administered 2014-04-29: 10 mg via ORAL
  Filled 2014-04-28: qty 2

## 2014-04-28 MED ORDER — CEFAZOLIN SODIUM-DEXTROSE 2-3 GM-% IV SOLR
2.0000 g | INTRAVENOUS | Status: AC
  Administered 2014-04-28: 2 g via INTRAVENOUS
  Filled 2014-04-28: qty 50

## 2014-04-28 SURGICAL SUPPLY — 68 items
BANDAGE GAUZE ELAST BULKY 4 IN (GAUZE/BANDAGES/DRESSINGS) ×6 IMPLANT
BENZOIN TINCTURE PRP APPL 2/3 (GAUZE/BANDAGES/DRESSINGS) ×6 IMPLANT
BIT DRILL 3.8X6 NS (BIT) ×3 IMPLANT
BIT DRILL 4 LONG FAST STEP (BIT) ×3 IMPLANT
BIT DRILL 4 SHORT FAST STEP (BIT) ×3 IMPLANT
BNDG ESMARK 4X9 LF (GAUZE/BANDAGES/DRESSINGS) IMPLANT
BRUSH SCRUB DISP (MISCELLANEOUS) ×6 IMPLANT
CORDS BIPOLAR (ELECTRODE) IMPLANT
COVER SURGICAL LIGHT HANDLE (MISCELLANEOUS) ×3 IMPLANT
DRAPE C-ARM 42X72 X-RAY (DRAPES) ×3 IMPLANT
DRAPE INCISE IOBAN 66X45 STRL (DRAPES) ×3 IMPLANT
DRAPE SURG 17X11 SM STRL (DRAPES) ×3 IMPLANT
DRAPE U-SHAPE 47X51 STRL (DRAPES) ×6 IMPLANT
DRILL BIT 2.8MM DB28 (MISCELLANEOUS) ×3 IMPLANT
DRSG ADAPTIC 3X8 NADH LF (GAUZE/BANDAGES/DRESSINGS) IMPLANT
DRSG MEPILEX BORDER 4X12 (GAUZE/BANDAGES/DRESSINGS) ×3 IMPLANT
DRSG MEPILEX BORDER 4X8 (GAUZE/BANDAGES/DRESSINGS) ×3 IMPLANT
DRSG PAD ABDOMINAL 8X10 ST (GAUZE/BANDAGES/DRESSINGS) IMPLANT
ELECT REM PT RETURN 9FT ADLT (ELECTROSURGICAL) ×3
ELECTRODE REM PT RTRN 9FT ADLT (ELECTROSURGICAL) ×2 IMPLANT
EVACUATOR 1/8 PVC DRAIN (DRAIN) IMPLANT
GLOVE BIO SURGEON STRL SZ7 (GLOVE) ×9 IMPLANT
GLOVE BIO SURGEON STRL SZ7.5 (GLOVE) ×3 IMPLANT
GLOVE BIOGEL PI IND STRL 7.0 (GLOVE) ×4 IMPLANT
GLOVE BIOGEL PI IND STRL 8 (GLOVE) ×2 IMPLANT
GLOVE BIOGEL PI INDICATOR 7.0 (GLOVE) ×2
GLOVE BIOGEL PI INDICATOR 8 (GLOVE) ×1
GOWN STRL REUS W/ TWL LRG LVL3 (GOWN DISPOSABLE) ×6 IMPLANT
GOWN STRL REUS W/TWL LRG LVL3 (GOWN DISPOSABLE) ×3
KIT BASIN OR (CUSTOM PROCEDURE TRAY) ×3 IMPLANT
KIT INFUSE LRG II (Orthopedic Implant) ×3 IMPLANT
KIT ROOM TURNOVER OR (KITS) ×3 IMPLANT
MANIFOLD NEPTUNE II (INSTRUMENTS) ×3 IMPLANT
NEEDLE HYPO 25X1 1.5 SAFETY (NEEDLE) IMPLANT
NS IRRIG 1000ML POUR BTL (IV SOLUTION) ×6 IMPLANT
PACK SHOULDER (CUSTOM PROCEDURE TRAY) ×3 IMPLANT
PACK TOTAL JOINT (CUSTOM PROCEDURE TRAY) IMPLANT
PAD ARMBOARD 7.5X6 YLW CONV (MISCELLANEOUS) ×6 IMPLANT
PEG STND 4.0X40MM (Orthopedic Implant) ×9 IMPLANT
PEG STND 4.0X45.0MM (Orthopedic Implant) ×3 IMPLANT
PEG STND 4.0X52.5MM (Orthopedic Implant) ×3 IMPLANT
PEGSTD 4.0X40MM (Orthopedic Implant) ×6 IMPLANT
PEGSTD 4.0X45.0MM (Orthopedic Implant) ×2 IMPLANT
PIN GUIDE SHOULDER 2.0MM (PIN) ×3 IMPLANT
PLATE SHOULDER S3 8HOLE RT (Plate) ×3 IMPLANT
SCREW LOCK 90D ANGLED 3.8X22 (Screw) ×12 IMPLANT
SCREW MULTIDIR 3.8X24 HUMRL (Screw) ×3 IMPLANT
SCREW MULTIDIR 3.8X26 HUMRL (Screw) ×9 IMPLANT
SCREW MULTIDIR 3.8X32 HUMRL (Screw) ×3 IMPLANT
SCREW MULTIDIR SS 3.8X28 HUMRL (Screw) ×3 IMPLANT
SLING ARM MED ADULT FOAM STRAP (SOFTGOODS) ×3 IMPLANT
SPONGE GAUZE 4X4 12PLY (GAUZE/BANDAGES/DRESSINGS) IMPLANT
SPONGE LAP 18X18 X RAY DECT (DISPOSABLE) ×3 IMPLANT
STAPLER VISISTAT 35W (STAPLE) ×3 IMPLANT
STOCKINETTE IMPERVIOUS 9X36 MD (GAUZE/BANDAGES/DRESSINGS) ×3 IMPLANT
SUCTION FRAZIER TIP 10 FR DISP (SUCTIONS) ×3 IMPLANT
SUT ETHIBOND 5 LR DA (SUTURE) IMPLANT
SUT VIC AB 0 CT1 27 (SUTURE) ×1
SUT VIC AB 0 CT1 27XBRD ANBCTR (SUTURE) ×2 IMPLANT
SUT VIC AB 2-0 CT1 27 (SUTURE) ×3
SUT VIC AB 2-0 CT1 TAPERPNT 27 (SUTURE) ×6 IMPLANT
SYR 5ML LL (SYRINGE) IMPLANT
SYR CONTROL 10ML LL (SYRINGE) IMPLANT
TOWEL OR 17X24 6PK STRL BLUE (TOWEL DISPOSABLE) ×3 IMPLANT
TOWEL OR 17X26 10 PK STRL BLUE (TOWEL DISPOSABLE) ×9 IMPLANT
TRAY FOLEY CATH 16FRSI W/METER (SET/KITS/TRAYS/PACK) IMPLANT
WATER STERILE IRR 1000ML POUR (IV SOLUTION) ×6 IMPLANT
YANKAUER SUCT BULB TIP NO VENT (SUCTIONS) ×3 IMPLANT

## 2014-04-28 NOTE — Anesthesia Preprocedure Evaluation (Signed)
Anesthesia Evaluation  Patient identified by MRN, date of birth, ID band Patient awake    Reviewed: Allergy & Precautions, H&P , NPO status , Patient's Chart, lab work & pertinent test results  Airway Mallampati: II  Neck ROM: full    Dental   Pulmonary Current Smoker,          Cardiovascular hypertension,     Neuro/Psych PSYCHIATRIC DISORDERS Anxiety ADHD    GI/Hepatic GERD-  ,  Endo/Other    Renal/GU      Musculoskeletal   Abdominal   Peds  Hematology   Anesthesia Other Findings   Reproductive/Obstetrics                           Anesthesia Physical Anesthesia Plan  ASA: II  Anesthesia Plan: General and Regional   Post-op Pain Management: MAC Combined w/ Regional for Post-op pain   Induction: Intravenous  Airway Management Planned: Oral ETT  Additional Equipment:   Intra-op Plan:   Post-operative Plan: Extubation in OR  Informed Consent: I have reviewed the patients History and Physical, chart, labs and discussed the procedure including the risks, benefits and alternatives for the proposed anesthesia with the patient or authorized representative who has indicated his/her understanding and acceptance.     Plan Discussed with: CRNA, Anesthesiologist and Surgeon  Anesthesia Plan Comments:         Anesthesia Quick Evaluation

## 2014-04-28 NOTE — H&P (Signed)
[name] is an 48 y.o. female.   Chief Complaint: R humerus nonunion. HPI: R humerus fx after fall downstairs with established nonunion.   Past Medical History  Diagnosis Date  . Hypertension   . ADHD (attention deficit hyperactivity disorder)   . Anxiety   . SUI (stress urinary incontinence, female)   . History of anemia   . GERD (gastroesophageal reflux disease)   . Cutaneous lupus erythematosus   . Cancer     bladder    Past Surgical History  Procedure Laterality Date  . Cesarean section  12-23-2004     W/ LEFT TUBAL LIGATION AND RIGHT SALPINGECTOMY  . Cesarean section  1995  . Bladder suspension N/A 02/13/2013    Procedure: [facility] SL[facility];  Surgeon: Malka So, MD;  Location: [facility];  Service: Urology;  Laterality: N/A;  . Tubal ligation      No family history on file. Social History:  reports that she has been smoking Cigarettes.  She has a 10 pack-year smoking history. She has never used smokeless tobacco. She reports that she drinks alcohol. She reports that she does not use illicit drugs.  Allergies:  Allergies  Allergen Reactions  . Oxycodone Other (See Comments)    Full dose causes hallucinations, can take small doses.  . Vicodin [Hydrocodone-Acetaminophen] Nausea And Vomiting and Other (See Comments)    dizzy    Medications Prior to Admission  Medication Sig Dispense Refill  . Aspirin-Salicylamide-Caffeine (BC HEADACHE PO) Take 1 packet by mouth every 6 (six) hours as needed (for headache or pain). For pain      . escitalopram (LEXAPRO) 20 MG tablet Take 20 mg by mouth daily.      . progesterone (PROMETRIUM) 200 MG capsule Take 200 mg by mouth daily.        Results for orders placed during the hospital encounter of 04/28/14 (from the past 48 hour(s))  BASIC METABOLIC PANEL     Status: Abnormal   Collection Time    04/28/14  9:17 AM      Result Value Ref Range   Sodium 138  137 - 147 mEq/L   Potassium 4.3  3.7 - 5.3 mEq/L   Chloride 99  96 - 112 mEq/L   CO2 26  19 - 32 mEq/L   Glucose, Bld 105 (*) 70 - 99 mg/dL   BUN 11  6 - 23 mg/dL   Creatinine, Ser 0.66  0.50 - 1.10 mg/dL   Calcium 9.5  8.4 - 10.5 mg/dL   GFR calc non Af Amer >90  >90 mL/min   GFR calc Af Amer >90  >90 mL/min   Comment: (NOTE)     The eGFR has been calculated using the CKD EPI equation.     This calculation has not been validated in all clinical situations.     eGFR's persistently <90 mL/min signify possible Chronic Kidney     Disease.  CBC     Status: Abnormal   Collection Time    04/28/14  9:17 AM      Result Value Ref Range   WBC 6.4  4.0 - 10.5 K/uL   RBC 4.27  3.87 - 5.11 MIL/uL   Hemoglobin 14.7  12.0 - 15.0 g/dL   HCT 43.6  36.0 - 46.0 %   MCV 102.1 (*) 78.0 - 100.0 fL   MCH 34.4 (*) 26.0 - 34.0 pg   MCHC 33.7  30.0 - 36.0 g/dL   RDW 13.1  11.5 - 15.5 %   Platelets 122 (*) 150 - 400 K/uL   No results found.  Review of Systems  All other systems reviewed and are negative.   Blood pressure 99/60, pulse 84, temperature 99.4 F (37.4 C), temperature source Oral, resp. rate 14, height _0  (1.651 m), weight 56.2 kg (123 lb 14.4 oz), SpO2 99.00%. Physical Exam  Constitutional: She is oriented to person, place, and time. She appears well-developed and well-nourished.  HENT:  Head: Atraumatic.  Eyes: EOM are normal.  Cardiovascular: Intact distal pulses.   Respiratory: Effort normal.  Musculoskeletal:  R humerus with motion at fracture site. NVID.  Neurological: She is alert and oriented to person, place, and time.  Skin: Skin is warm and dry.  Psychiatric: She has a normal mood and affect.     Assessment/Plan R humerus nonunion Plan ORIF with infuse graft Risks / benefits of surgery discussed Consent on chart  NPO for OR Preop antibiotics   Nita Sells 04/28/2014, 11:39 AM

## 2014-04-28 NOTE — Op Note (Signed)
Procedure(s): OPEN REDUCTION INTERNAL FIXATION (ORIF) PROXIMAL HUMERUS FRACTURE Procedure Note  [name] female 48 y.o. 04/28/2014  Procedure(s) and Anesthesia Type:    * OPEN REDUCTION INTERNAL FIXATION (ORIF) RIIGHT PROXIMAL HUMERUS NONUNION - General  Surgeon(s) and Role:    * Nita Sells, MD - Primary   Indications:  48 y.o. female s/p fall 6 months ago with proximal humeral shaft nonunion.  She is an alcoholic and heavy tobacco user. She was counseled preoperatively that she would need to quit smoking if we were to go forward with surgical fixation to try and promote union. She agreed.     Surgeon: Nita Sells   Assistants: Jeanmarie Hubert PA-C [facility] was present and scrubbed throughout the procedure and was essential in positioning, retraction, exposure, and closure)  Anesthesia: General endotracheal anesthesia with preoperative interscalene block given by the attending anesthesiologist    Procedure Detail  Findings: The nonunion was taken down and the intramedullary canal was reestablished on both sides. The oblique fracture was fixed with a long Biomet S3 proximal humeral locking plate with one interfragmentary lag screw with excellent compression. Infuse was used to promote healing..  Estimated Blood Loss:  200 mL         Drains: none  Blood Given: none         Specimens: none        Complications:  * No complications entered in OR log *         Disposition: PACU - hemodynamically stable.         Condition: stable    Procedure:   DESCRIPTION OF PROCEDURE: The patient was identified in preoperative  holding area where I personally marked the operative site after  verifying site, side, and procedure with the patient. The patient was taken back  to the operating room where general anesthesia was induced without  complication and was placed in the beach-chair position with the back  elevated about 40 degrees and all  extremities carefully padded and  positioned.  An incision was made from the palpable tip of the coracoid down the lateral aspect of the arm proximal two thirds down the arm. Dissection was carried down through subcutaneous tissues and cephalic vein was identified proximally. The deltopectoral interval was developed and then distally the biceps was taken medially. The fracture was first identified proximally. The deltoid insertion was scarred down to the fracture and had to be elevated for fracture exposure. Distally the brachialis was split longitudinally to expose the distal humeral segment. Careful dissection was made staying directly on bone around the fracture proximally and distally. The intramedullary canal was identified proximally and distally and reestablished. Curettes and rongeurs were used to remove scar tissue and to freshen the edges of the oblique fracture on both sides. The radial nerve was identified posteriorly and protected. The fracture was then reduced and a long S3 plate was placed anterolaterally. A  clamp was then used to hold the fracture reduced and the plate in place. Fluoroscopic imaging showed appropriate reduction the fracture and position of the plate. The plate had to be bent distally slightly given the previous areas of partial healing. After bicortical screws were placed proximally and distally an interfragmentary screw was placed over drilling the anterior cortex with a 3.8 drill obtaining excellent compression at the fracture site. Prior to application of the plate a she does infuse was placed between the fracture fragments. Proximally for locking smooth pegs were placed for additional fixation and the  humeral head. No penetration humeral head occurred. The wound was copiously irrigated with normal saline after verifying appropriate positioning of the hardware and fracture reduction. The additional infuse was packed around the fracture site. The deltoid insertion was then  repaired to the sleeve of periosteum and this was also placed between the deltoid insertion site in the pectoralis major to encourage healing here. Skin was then closed with 2-0 Vicryl in a deep dermal layer and staples for skin closure. Light sterile dressing was applied. The patient is a sling, allowed to awaken from anesthesia, transferred to stretcher and taken to recovery room in stable condition.  Postoperative plan: She will be kept overnight for pain control and observation. She'll likely be discharged home tomorrow. She can begin some passive range of motion exercises of her shoulder elbow and wrist when comfortable.

## 2014-04-28 NOTE — Anesthesia Procedure Notes (Signed)
Anesthesia Regional Block:  Interscalene brachial plexus block  Pre-Anesthetic Checklist: ,, timeout performed, Correct Patient, Correct Site, Correct Laterality, Correct Procedure, Correct Position, site marked, Risks and benefits discussed,  Surgical consent,  Pre-op evaluation,  At surgeon's request and post-op pain management  Laterality: Right  Prep: chloraprep       Needles:  Injection technique: Single-shot  Needle Type: Echogenic Stimulator Needle     Needle Length: 5cm 5 cm Needle Gauge: 22 and 22 G    Additional Needles:  Procedures: ultrasound guided (picture in chart) and nerve stimulator Interscalene brachial plexus block  Nerve Stimulator or Paresthesia:  Response: biceps flexion, 0.45 mA,   Additional Responses:   Narrative:  Start time: 04/28/2014 10:22 AM End time: 04/28/2014 10:32 AM Injection made incrementally with aspirations every 5 mL.  Performed by: Personally  Anesthesiologist: Dr Marcie Bal  Additional Notes: Functioning IV was confirmed and monitors were applied.  A 75mm 22ga Arrow echogenic stimulator needle was used. Sterile prep and drape,hand hygiene and sterile gloves were used.  Negative aspiration and negative test dose prior to incremental administration of local anesthetic. The patient tolerated the procedure well.  Ultrasound guidance: relevent anatomy identified, needle position confirmed, local anesthetic spread visualized around nerve(s), vascular puncture avoided.  Image printed for medical record.

## 2014-04-28 NOTE — Transfer of Care (Signed)
Immediate Anesthesia Transfer of Care Note  Patient: [name]  Procedure(s) Performed: Procedure(s): OPEN REDUCTION INTERNAL FIXATION (ORIF) PROXIMAL HUMERUS FRACTURE (Right)  Patient Location: PACU  Anesthesia Type:General  Level of Consciousness: awake, alert  and oriented  Airway & Oxygen Therapy: Patient Spontanous Breathing and Patient connected to nasal cannula oxygen  Post-op Assessment: Report given to PACU RN and Post -op Vital signs reviewed and stable  Post vital signs: Reviewed and stable  Complications: No apparent anesthesia complications

## 2014-04-28 NOTE — Anesthesia Postprocedure Evaluation (Signed)
  Anesthesia Post-op Note  Patient: [name]  Procedure(s) Performed: Procedure(s): OPEN REDUCTION INTERNAL FIXATION (ORIF) PROXIMAL HUMERUS FRACTURE (Right)  Patient Location: PACU  Anesthesia Type:GA combined with regional for post-op pain  Level of Consciousness: awake, alert , oriented and patient cooperative  Airway and Oxygen Therapy: Patient Spontanous Breathing and Patient connected to nasal cannula oxygen  Post-op Pain: none  Post-op Assessment: Post-op Vital signs reviewed, Patient's Cardiovascular Status Stable, Respiratory Function Stable, Patent Airway, No signs of Nausea or vomiting and Pain level controlled  Post-op Vital Signs: Reviewed and stable  Last Vitals:  Filed Vitals:   04/28/14 1631  BP: 108/52  Pulse: 73  Temp: 36.6 C  Resp: 17    Complications: No apparent anesthesia complications

## 2014-04-28 NOTE — Progress Notes (Signed)
Witnessed wasted 90 cc Hydromorphone IV with Charlies Silvers Rn

## 2014-04-29 LAB — CBC
HCT: 32.8 % — ABNORMAL LOW (ref 36.0–46.0)
HEMOGLOBIN: 10.4 g/dL — AB (ref 12.0–15.0)
MCH: 32.9 pg (ref 26.0–34.0)
MCHC: 31.7 g/dL (ref 30.0–36.0)
MCV: 103.8 fL — ABNORMAL HIGH (ref 78.0–100.0)
Platelets: DECREASED 10*3/uL (ref 150–400)
RBC: 3.16 MIL/uL — ABNORMAL LOW (ref 3.87–5.11)
RDW: 12.9 % (ref 11.5–15.5)
WBC: 5.6 10*3/uL (ref 4.0–10.5)

## 2014-04-29 MED ORDER — HYDROMORPHONE HCL 2 MG PO TABS: ORAL_TABLET | ORAL | Status: DC

## 2014-04-29 NOTE — Progress Notes (Signed)
   PATIENT ID: [name]   1 Day Post-Op Procedure(s) (LRB): OPEN REDUCTION INTERNAL FIXATION (ORIF) PROXIMAL HUMERUS FRACTURE (Right)  Subjective: Some pain overnight but finding pain controlled this am. No other complaints or concerns. Ready to go home.   Objective:  Filed Vitals:   04/29/14 0554  BP: 101/68  Pulse: 105  Temp: 99.1 F (37.3 C)  Resp: 17     Awake, alert, orientated R UE dressing c/d/i Wiggles toes, distally NVI  Labs:   Recent Labs  04/28/14 0917  HGB 14.7   Recent Labs  04/28/14 0917  WBC 6.4  RBC 4.27  HCT 43.6  PLT 122*   Recent Labs  04/28/14 0917  NA 138  K 4.3  CL 99  CO2 26  BUN 11  CREATININE 0.66  GLUCOSE 105*  CALCIUM 9.5    Assessment and Plan: 1 day s/p ORIF right proximal humerus nonunion PT ordered, hand/wrist/elbow ROM only Continue pain mgmt, hopefully can d/c IV dilaudid today D/c home when pain controlled Dilaudid 2mg  tabs for home pain control, script in chart Follow up Dr. Tamera Punt in 2 weeks     VTE proph: ASA 325mg  BID, SCDs

## 2014-04-29 NOTE — Evaluation (Signed)
Occupational Therapy Evaluation Patient Details Name: [name] MRN: [id number] DOB: December 07, 1965 Today's Date: 04/29/2014    History of Present Illness This 48 y.o. 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   Pt reports she initially fractured Rt. UE in Oct 2014   Clinical Impression   Pt admitted with above.  She demonstrates the below listed deficits and will benefit from continued OT to maximize safety and independence with BADLs.   Pt is independently able to instruct therapist how to safely assist her with BADLs.  SHe was instructed in AROM Rt. Elbow, wrist, and hand as well as sling wear and care and proper positioning Rt. UE.  Pt reports MD instructed her she could beging "stretching" Rt UE overhead; however, orders written and PA note states  ROM only elbow, wrist, and hand only.  Message left for MD to clarify.  Will see again if MD orders ROM to shoulder, otherwise, pt is independent with instruction provided this am.        Follow Up Recommendations  Supervision - Intermittent;Other (comment) (follow up per MD recommendations)    Equipment Recommendations  None recommended by OT    Recommendations for Other Services       Precautions / Restrictions Precautions Precautions: Other (comment) Precaution Comments: OT for elbow, wrist and hand ROM only.  Sling on at all times Required Braces or Orthoses: Sling      Mobility Bed Mobility Overal bed mobility: Modified Independent                Transfers Overall transfer level: Modified independent                    Balance Overall balance assessment: No apparent balance deficits (not formally assessed)                                          ADL Overall ADL's : Needs assistance/impaired         Upper Body Bathing: Minimal assitance;Sitting;Standing       Upper Body Dressing : Minimal assistance;Sitting       Toilet Transfer: Modified Independent    Toileting- Clothing Manipulation and Hygiene: Modified independent               Vision                     Perception     Praxis      Pertinent Vitals/Pain 7/10 Rt UE - see vitals flow sheet.      Hand Dominance Right   Extremity/Trunk Assessment Upper Extremity Assessment Upper Extremity Assessment: RUE deficits/detail RUE Deficits / Details: elbow AROM ~-40*-90 - limited by pain  RUE: Unable to fully assess due to immobilization   Lower Extremity Assessment Lower Extremity Assessment: Overall WFL for tasks assessed   Cervical / Trunk Assessment Cervical / Trunk Assessment: Normal   Communication Communication Communication: No difficulties   Cognition Arousal/Alertness: Awake/alert Behavior During Therapy: WFL for tasks assessed/performed Overall Cognitive Status: Within Functional Limits for tasks assessed                     General Comments       Exercises       Shoulder Instructions Shoulder Instructions Donning/doffing shirt without moving shoulder: Patient able to independently direct caregiver Method for sponge bathing  under operated UE: Patient able to independently direct caregiver Donning/doffing sling/immobilizer: Patient able to independently direct caregiver Correct positioning of sling/immobilizer: Patient able to independently direct caregiver Pendulum exercises (written home exercise program):  (na) ROM for elbow, wrist and digits of operated UE: Supervision/safety Sling wearing schedule (on at all times/off for ADL's): Independent Proper positioning of operated UE when showering: Independent Positioning of UE while sleeping: [place] expects to be discharged to:: Private residence Living Arrangements: Spouse/significant other Available Help at Discharge: Family;Available PRN/intermittently Type of Home: House                                  Prior  Functioning/Environment Level of Independence: Needs assistance  Gait / Transfers Assistance Needed: Pt is independent with ambulation without assistive device. ADL's / Homemaking Assistance Needed: Pt reports spouse has been assisting her with UB ADLs since oct 2014        OT Diagnosis: Generalized weakness;Acute pain   OT Problem List: Decreased range of motion;Decreased knowledge of precautions;Impaired UE functional use;Pain   OT Treatment/Interventions: Self-care/ADL training;Therapeutic exercise;Patient/family education    OT Goals(Current goals can be found in the care plan section) Acute Rehab OT Goals Patient Stated Goal: to get better OT Goal Formulation: With patient Time For Goal Achievement: 04/30/14 Potential to Achieve Goals: Good ADL Goals Additional ADL Goal #1: Pt will be independent with precautions and HEP Rt. UE  OT Frequency: Min 2X/week   Barriers to D/C:            Co-evaluation              End of Session Equipment Utilized During Treatment: Other (comment) (sling)  Activity Tolerance: Patient limited by pain Patient left: in bed;with call bell/phone within reach   Time: 4403-4742 OT Time Calculation (min): 16 min Charges:  OT General Charges $OT Visit: 1 Procedure OT Evaluation $Initial OT Evaluation Tier I: 1 Procedure OT Treatments $Self Care/Home Management : 8-22 mins G-Codes: OT G-codes **NOT FOR INPATIENT CLASS** Functional Limitation: Self care Self Care Current Status (V9563): At least 20 percent but less than 40 percent impaired, limited or restricted Self Care Goal Status (O7564): At least 20 percent but less than 40 percent impaired, limited or restricted Self Care Discharge Status [phone]): At least 20 percent but less than 40 percent impaired, limited or restricted  [name] 04/29/2014, 12:21 PM

## 2014-04-29 NOTE — Discharge Instructions (Signed)
Discharge Instructions after Open Shoulder Repair  A sling has been provided for you. Remain in your sling at all times. Okay to come out of sling at rest and straighten arm. Okay to come out of sling and work on PASSIVE range of motion  Use ice on the shoulder intermittently over the first 48 hours after surgery.  Pain medicine has been prescribed for you.  Use your medicine liberally over the first 48 hours, and then you can begin to taper your use. You may take Extra Strength Tylenol or Tylenol only in place of the pain pills. DO NOT take ANY nonsteroidal anti-inflammatory pain medications: Advil, Motrin, Ibuprofen, Aleve, Naproxen or Naprosyn.  You may remove your dressing after two days  You may shower 5 days after surgery. The incisions CANNOT get wet prior to 5 days. Simply allow the water to wash over the site and then pat dry. Do not rub the incisions. Make sure your axilla (armpit) is completely dry after showering.  Take one aspirin, a day for 2 weeks after surgery, unless you have an aspirin sensitivity/ allergy or asthma.   Please call [phone] during normal business hours or [phone] after hours for any problems. Including the following:  - excessive redness of the incisions - drainage for more than 4 days - fever of more than 101.5 F  *Please note that pain medications will not be refilled after hours or on weekends.

## 2014-04-29 NOTE — Progress Notes (Signed)
AVS discharge instructions were given and went over with patient. Patient was also given perscription for diluadid to take to her pharmacy. Patient stated that she did not have ant questions. Called volunteers to assist patient to her transportation.

## 2014-04-29 NOTE — Discharge Summary (Signed)
Patient ID: [name] MRN: [id number] DOB/AGE: Jul 08, 1966 48 y.o.  Admit date: 04/28/2014 Discharge date: 04/29/2014  Admission Diagnoses:  Active Problems:   Fracture of proximal humerus with nonunion   Discharge Diagnoses:  Same  Past Medical History  Diagnosis Date  . Hypertension   . ADHD (attention deficit hyperactivity disorder)   . Anxiety   . SUI (stress urinary incontinence, female)   . History of anemia   . GERD (gastroesophageal reflux disease)   . Cutaneous lupus erythematosus   . Cancer     bladder  . Anemia     Surgeries: Procedure(s): OPEN REDUCTION INTERNAL FIXATION (ORIF) PROXIMAL HUMERUS FRACTURE on 04/28/2014   Consultants:    Discharged Condition: Improved  Hospital Course: [name] is an 48 y.o. female who was admitted 04/28/2014 for operative treatment of right humerus fracture with nonunion.R humerus fx after fall downstairs with established nonunion. Patient has severe unremitting pain that affects sleep, daily activities, and work/hobbies. After pre-op clearance the patient was taken to the operating room on 04/28/2014 and underwent  Procedure(s): OPEN REDUCTION INTERNAL FIXATION (ORIF) PROXIMAL HUMERUS FRACTURE.    Patient was given perioperative antibiotics: Anti-infectives   Start     Dose/Rate Route Frequency Ordered Stop   04/28/14 1800  ceFAZolin (ANCEF) IVPB 1 g/50 mL premix     1 g 100 mL/hr over 30 Minutes Intravenous Every 6 hours 04/28/14 1633 04/29/14 0614   04/28/14 1115  ceFAZolin (ANCEF) IVPB 2 g/50 mL premix     2 g 100 mL/hr over 30 Minutes Intravenous To Short Stay 04/28/14 1029 04/28/14 1213       Patient was given sequential compression devices, early ambulation, and ASA 325mg  BID to prevent DVT.  Patient benefited maximally from hospital stay and there were no complications.    Recent vital signs: Patient Vitals for the past 24 hrs:  BP Temp Temp src Pulse Resp SpO2  04/29/14 0939 104/62 mmHg 99.1 F (37.3 C)  Oral 90 17 94 %  04/29/14 0554 101/68 mmHg 99.1 F (37.3 C) - 105 17 95 %  04/29/14 0224 111/64 mmHg 99.4 F (37.4 C) - 98 19 92 %  04/28/14 2101 100/61 mmHg 98.3 F (36.8 C) Oral 86 18 96 %     Recent laboratory studies:  Recent Labs  04/28/14 0917 04/29/14 0643  WBC 6.4 5.6  HGB 14.7 10.4*  HCT 43.6 32.8*  PLT 122* PLATELET CLUMPS NOTED ON SMEAR, COUNT APPEARS DECREASED  NA 138  --   K 4.3  --   CL 99  --   CO2 26  --   BUN 11  --   CREATININE 0.66  --   GLUCOSE 105*  --   CALCIUM 9.5  --      Discharge Medications:     Medication List         BC HEADACHE PO  Take 1 packet by mouth every 6 (six) hours as needed (for headache or pain). For pain     Cholecalciferol 1000 UNITS Tbdp  Take 1,000 Units by mouth daily.     clonazePAM 1 MG tablet  Commonly known as:  KLONOPIN  Take 1 mg by mouth 2 (two) times daily as needed for anxiety.     escitalopram 20 MG tablet  Commonly known as:  LEXAPRO  Take 20 mg by mouth daily.     HYDROmorphone 2 MG tablet  Commonly known as:  DILAUDID  1-3 tabs every 4 hours prn pain  progesterone 200 MG capsule  Commonly known as:  PROMETRIUM  Take 200 mg by mouth daily.     traZODone 100 MG tablet  Commonly known as:  DESYREL  Take 100 mg by mouth at bedtime.        Diagnostic Studies: Dg Humerus Right  04/28/2014   CLINICAL DATA:  Status post humeral fixation  EXAM: RIGHT HUMERUS - 2+ VIEW  COMPARISON:  None.  FINDINGS: The fixation sideplate with multiple fixation screws are noted along the proximal humerus. Lucency is noted within the medullary cavity suggest no underlying process. Fracture site proximally is well visualized.   Electronically Signed   By: Inez Catalina M.D.   On: 04/28/2014 15:22   Dg Humerus Right  04/28/2014   CLINICAL DATA:  ORIF  EXAM: DG C-ARM 61-120 MIN; RIGHT HUMERUS - 2+ VIEW  COMPARISON:  None  FINDINGS: Plate and screws across the humeral head and humeral shaft. Fracture of the humeral neck  is noted with satisfactory alignment. Cannot exclude lytic lesion in the humerus. Correlate with prior studies.  IMPRESSION: Plate and screw fixation proximal humerus.   Electronically Signed   By: Franchot Gallo M.D.   On: 04/28/2014 14:49   Dg C-arm 1-60 Min  04/28/2014   CLINICAL DATA:  ORIF  EXAM: DG C-ARM 61-120 MIN; RIGHT HUMERUS - 2+ VIEW  COMPARISON:  None  FINDINGS: Plate and screws across the humeral head and humeral shaft. Fracture of the humeral neck is noted with satisfactory alignment. Cannot exclude lytic lesion in the humerus. Correlate with prior studies.  IMPRESSION: Plate and screw fixation proximal humerus.   Electronically Signed   By: Franchot Gallo M.D.   On: 04/28/2014 14:49    Disposition: 01-Home or Self Care      Discharge Instructions   Call MD / Call 911    Complete by:  As directed   If you experience chest pain or shortness of breath, CALL 911 and be transported to the hospital emergency room.  If you develope a fever above 101 F, pus (white drainage) or increased drainage or redness at the wound, or calf pain, call your surgeon's office.     Constipation Prevention    Complete by:  As directed   Drink plenty of fluids.  Prune juice may be helpful.  You may use a stool softener, such as Colace (over the counter) 100 mg twice a day.  Use MiraLax (over the counter) for constipation as needed.     Diet - low sodium heart healthy    Complete by:  As directed      Increase activity slowly as tolerated    Complete by:  As directed            Follow-up Information   Follow up with Nita Sells, MD. Schedule an appointment as soon as possible for a visit in 2 weeks.   Specialty:  Orthopedic Surgery   Contact information:   [place] [address] [phone]        Signed: Grier Mitts 04/29/2014, 4:53 PM

## 2014-04-30 ENCOUNTER — Encounter (HOSPITAL_COMMUNITY): Payer: Self-pay | Admitting: Orthopedic Surgery

## 2014-05-27 ENCOUNTER — Encounter: Payer: Self-pay | Admitting: Orthopedic Surgery

## 2014-06-03 ENCOUNTER — Encounter: Payer: Self-pay | Admitting: Orthopedic Surgery

## 2014-06-29 LAB — CBC
HCT: 42.1 % (ref 35.0–47.0)
HGB: 13.7 g/dL (ref 12.0–16.0)
MCH: 32.3 pg (ref 26.0–34.0)
MCHC: 32.5 g/dL (ref 32.0–36.0)
MCV: 99 fL (ref 80–100)
Platelet: 190 10*3/uL (ref 150–440)
RBC: 4.23 10*6/uL (ref 3.80–5.20)
RDW: 14.3 % (ref 11.5–14.5)
WBC: 7.7 10*3/uL (ref 3.6–11.0)

## 2014-06-29 LAB — DRUG SCREEN, URINE
Amphetamines, Ur Screen: NEGATIVE (ref ?–1000)
Barbiturates, Ur Screen: NEGATIVE (ref ?–200)
Benzodiazepine, Ur Scrn: NEGATIVE (ref ?–200)
Cannabinoid 50 Ng, Ur ~~LOC~~: NEGATIVE (ref ?–50)
Cocaine Metabolite,Ur ~~LOC~~: NEGATIVE (ref ?–300)
MDMA (Ecstasy)Ur Screen: NEGATIVE (ref ?–500)
Methadone, Ur Screen: NEGATIVE (ref ?–300)
Opiate, Ur Screen: POSITIVE (ref ?–300)
PHENCYCLIDINE (PCP) UR S: NEGATIVE (ref ?–25)
TRICYCLIC, UR SCREEN: NEGATIVE (ref ?–1000)

## 2014-06-29 LAB — ETHANOL
Ethanol %: 0.151 % — ABNORMAL HIGH (ref 0.000–0.080)
Ethanol: 151 mg/dL

## 2014-06-29 LAB — URINALYSIS, COMPLETE
BILIRUBIN, UR: NEGATIVE
BLOOD: NEGATIVE
Bacteria: NONE SEEN
GLUCOSE, UR: NEGATIVE mg/dL (ref 0–75)
Hyaline Cast: 2
Leukocyte Esterase: NEGATIVE
NITRITE: NEGATIVE
PH: 5 (ref 4.5–8.0)
Protein: NEGATIVE
RBC,UR: <1 /HPF (ref 0–5)
Specific Gravity: 1.024 (ref 1.003–1.030)

## 2014-06-29 LAB — ACETAMINOPHEN LEVEL: ACETAMINOPHEN: 6 ug/mL — AB

## 2014-06-29 LAB — BASIC METABOLIC PANEL
ANION GAP: 8 (ref 7–16)
BUN: 14 mg/dL (ref 7–18)
CHLORIDE: 114 mmol/L — AB (ref 98–107)
CREATININE: 0.62 mg/dL (ref 0.60–1.30)
Calcium, Total: 8.5 mg/dL (ref 8.5–10.1)
Co2: 23 mmol/L (ref 21–32)
EGFR (Non-African Amer.): >60
Glucose: 78 mg/dL (ref 65–99)
OSMOLALITY: 288 (ref 275–301)
Potassium: 3.5 mmol/L (ref 3.5–5.1)
SODIUM: 145 mmol/L (ref 136–145)

## 2014-06-29 LAB — SALICYLATE LEVEL: Salicylates, Serum: 8.3 mg/dL — ABNORMAL HIGH

## 2014-06-29 LAB — TSH: THYROID STIMULATING HORM: 0.367 u[IU]/mL — AB

## 2014-06-30 ENCOUNTER — Inpatient Hospital Stay: Payer: Self-pay | Admitting: Psychiatry

## 2014-07-02 LAB — TSH: Thyroid Stimulating Horm: 0.555 u[IU]/mL

## 2014-07-04 ENCOUNTER — Encounter: Payer: Self-pay | Admitting: Orthopedic Surgery

## 2015-03-26 NOTE — Discharge Summary (Signed)
PATIENT NAME:  [name], [name] MR#:  [id number] DATE OF BIRTH:  1966/07/07  DATE OF ADMISSION:  04/04/2013 DATE OF DISCHARGE:  04/08/2013  HOSPITAL COURSE: See dictated history and physical for details of admission.   This 49 year old woman was admitted to the hospital for alcohol abuse and symptoms of depression. She was able to detox readily without any difficulty. She did not have seizures or any signs of delirium. She was cooperative with treatment and attended treatment groups appropriately. Her depression was treated with Lexapro 10 mg once a day, and she was continued on her progesterone, and trazodone was used at night for sleep. The patient consistently denied any suicidal ideation in the hospital. Her mood and affect improved. At the time of discharge she was agreeing to attend intensive outpatient as a followup program and appeared to be motivated and interested in change.   DISCHARGE MEDICATIONS: Lexapro 10 mg per day, progesterone 200 mg a day, trazodone 50 mg at night.   LABORATORY RESULTS: Admission labs showed a CBC with normal hematocrit, hemoglobin, platelets and white count. Chemistries showed creatinine low at 0.57, alkaline phosphatase elevated at 162, AST elevated at 111. Alcohol level initially 281. TSH 0.6, which is normal. Urinalysis unremarkable. Drug screen negative. Followup alcohol level prior to admission 190.   MENTAL STATUS EXAM AT DISCHARGE: Neatly dressed and groomed woman, looks her stated age, cooperative with the interview. Good eye contact, normal psychomotor activity. Speech normal in rate, tone and volume. Affect euthymic, reactive and appropriate. Mood stated as good. Thoughts are lucid, without loosening of associations or delusions. Denies auditory or visual hallucinations. Denies suicidal or homicidal ideation. Shows improved insight and judgment. Normal intelligence.   DISPOSITION: Discharge home.   FOLLOWUP: With IOP.   DIAGNOSES, PRINCIPAL AND PRIMARY:    AXIS I: Alcohol dependence.   SECONDARY DIAGNOSES: AXIS I: Dysthymia.  AXIS II: Deferred.  AXIS III: Status post menopause, using progesterone.  AXIS IV: Severe, ongoing stress from substance abuse.  AXIS V: Functioning at time of discharge: 63.     ____________________________ Gonzella Lex, MD jtc:dm D: 04/13/2013 23:41:00 ET T: 04/14/2013 12:08:41 ET JOB#: [id number]  cc: Gonzella Lex, MD, <Dictator>

## 2015-03-26 NOTE — Consult Note (Signed)
PATIENT NAME:  [name], [name] MR#:  [id number] DATE OF BIRTH:  [birth year]  DATE OF CONSULTATION:  10/01/2013  CONSULTING PHYSICIAN:  Gonzella Lex, MD  IDENTIFYING INFORMATION AND REASON FOR CONSULT: A 48 year old woman with a history of alcohol dependence, who presented voluntarily to the hospital. Her chief complaint, "My counselor thought I needed detox, but I don't think I do."   HISTORY OF PRESENT ILLNESS: Information obtained from the patient and the chart. The patient states that she relapsed into drinking a couple of weeks ago. She denies that it has been an everyday thing, says that she can go for several days without drinking still, but admits that she had a bottle of wine to drink in the last day. When she came to the Emergency Room, her blood alcohol level was over 160. She talked to her counselor at her intensive outpatient program, who told her she needed to come to the hospital for detox. The patient says that she has been using clonazepam prescribed for her by Dr. Jordan Hawks at the [facility], but denies that she is using any other drugs. Mood has been somewhat anxious, but she completely denies suicidal ideation. Denies any psychotic symptoms. Denies homicidal ideation. The patient states that she wants to be released from the Emergency Room and go home and does not want to come into the hospital for detox.   PAST PSYCHIATRIC HISTORY: Denies any history of suicide attempts. She has been in the hospital here for detox before, and she also has attended the intensive outpatient program at our facility. Additionally, she has been to [facility] and is currently getting treatment at the [facility] in [place]. She denies history of suicide attempts. Denies history of violence. She is currently being treated with Lexapro. Her doctor at the [facility] recently started her on clonazepam. She quotes a dose of 1 mg b.i.d.   SOCIAL HISTORY: The patient lives with her husband  and 47-year-old child. She has a 66 year old, who does not live at home. 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 Her husband sounds like he has been supportive of her treatment.   PAST MEDICAL HISTORY: The patient takes progesterone daily, but has no other significant ongoing medical problems.   CURRENT MEDICATIONS: Lexapro 20 mg per day, clonazepam 1 mg b.i.d., progesterone 200 mg a day.   ALLERGIES: HYDROCODONE.   REVIEW OF SYSTEMS:  Feels slightly shaky. A little sick to her stomach but not vomiting. No delirium. No confusion. No hallucinations. Denies suicidal or homicidal ideation.   SUBSTANCE ABUSE HISTORY: Notably, she says that she has had "DTs" in the past, but when she describes it, it does not actually sound like delirium tremens so much as just tremulousness and typical alcohol withdrawal. No history of withdrawal seizures.   MENTAL STATUS EXAMINATION: Reasonably well-groomed woman, looks her stated age, cooperative with the interview. Eye contact good. Psychomotor activity normal. Speech normal rate, tone and volume. Affect is reactive, some tearfulness, but not out of control. Mood stated as being anxious. Thoughts appear to be generally lucid. No evidence of loosening of associations or delusions. Denies auditory or visual hallucinations. Denies suicidal or homicidal ideation. Judgment and insight slightly impaired about her substance abuse but not to a psychotic degree. She understands risks and benefits and is able to make reasonable decisions for herself about medical care. Intelligence  normal. Alert and oriented x 4.   LABORATORY RESULTS: Her drug screen positive only for benzodiazepines. TSH normal at 0.58. Alcohol level 167 this morning. Chemistry panel unremarkable. Liver enzymes normal. CBC unremarkable.   VITAL SIGNS: Her blood pressure is normal. Pulse is normal.  Temperature normal.   ASSESSMENT: A 49 year old woman with alcohol dependence, came into the hospital intoxicated, initially saying she was here for detox. She now is declining my recommendation for admission for detox. She is not psychotic. She understands the risks and benefits of treatment and is able to make decisions for herself. She does not meet commitment criteria. She is not acutely dangerous to herself. I counseled the patient that I recommended she come into the hospital for detox because I thought her chances of sustained sobriety were better that way.  I have also counseled her that I would disapprove the use of clonazepam and think she should be detoxed off of that. I have also suggested that we can choose some better medicines to help her with alcohol dependence such as acamprosate or naltrexone. She is refusing admission to the hospital. At this point, as I said, she does not meet criteria for involuntary commitment.   TREATMENT PLAN: Discussed with the Emergency Room doctor. The patient is cleared from my perspective for no further inpatient treatment that can be provided at this time. She has been educated that she can return to the hospital at any time if needed.   DIAGNOSIS, PRINCIPAL AND PRIMARY:  AXIS I:  Alcohol dependence.   SECONDARY DIAGNOSES: AXIS I:  No further. AXIS II:  Deferred.  AXIS III:  Alcohol intoxication.  AXIS IV:  Moderate to severe from ongoing stress and social problems from her substance use.  AXIS V:  Functioning at time of evaluation, 53.    ____________________________ Gonzella Lex, MD jtc:dmm D: 10/01/2013 20:11:57 ET T: 10/01/2013 20:21:38 ET JOB#: [id number]  cc: Gonzella Lex, MD, <Dictator> Gonzella Lex MD ELECTRONICALLY SIGNED 10/01/2013 21:27

## 2015-03-26 NOTE — H&P (Signed)
PATIENT NAME:  [name], [name] MR#:  [id number] DATE OF BIRTH:  10-30-1966  DATE OF ADMISSION:  04/04/2013  PLACE OF DICTATION:  Emergency Room [place], [place], [place], [place]  SEX:  Female.  RACE:  White.  AGE:  49 years.  INITIAL ASSESSMENT AND PSYCHIATRIC EVALUATION  IDENTIFYING INFORMATION AND CHIEF COMPLAINT:  The patient is a 49 year old white female employed as an Programmer, applications. at [employer] and held the job for 2-1/2 years and loves it. The patient is married for 24 years and lives with her 2 children that are an 64 year old and 64-year-old along with her husband who is 26 years old. The patient comes for first inpatient psychiatry at the [facility] with the chief complaint, "Depression and drinking alcohol".   HISTORY OF PRESENT ILLNESS:  The patient was asked when she last felt well. She reported some time ago. She started getting depressed a year ago and got worse recently and in addition she started drinking alcohol. Has been drinking alcohol for 10 years but recently it got worse to the extent that she has been drinking 5 beers per day. She decided that she needed help, as it relates to no reason for her depression and no loss.  PAST PSYCHIATRIC HISTORY:  No previous history of inpatient psychiatry, no history of suicidal attempts. Not being followed by a psychiatrist.  FAMILY HISTORY OF MENTAL ILLNESS:  The patient is adopted. Apparently she was adopted when she was 89 weeks old, relates well with her adoptive parents who live just close by and 20 minutes away. Has 2 brothers and close to the family.  PERSONAL HISTORY:  Born in  [place], graduated of high school, has a Transport planner in nursing.  WORK HISTORY: First job was at [employer] at age 28 years, this job lasted for 2 years, quit because of school. 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 Currently employed at [employer] and held the job for 2-1/2 years and loves it.   MARITAL STATUS:  Married is well, married at 37 years, still married to her husband who works as an area Hotel manager, has 2 children.  ALCOHOL AND DRUGS:  First drink of alcohol was at a very young age. Started drinking alcohol several years ago but recently has been drinking at the rate of 5 beers per day and is probably self-medicating, no history of DWIs, no arrests of public drunkenness. Denies any type of illicit drug use. Denies using IV drugs. Does admit to smoking cigarettes at the rate of a pack and a half a day for many years.   MEDICAL HISTORY: Has high blood pressure. No known diabetes mellitus. Status post 2 c-sections, status post tubal ligation. No history of motor vehicle accident, never been on unconscious. Being followed by Dr. Louis Meckel at [facility] and the last appointment was in 2013. The next appointment is in 2014.  PHYSICAL EXAMINATION:   VITAL SIGNS:  Temperature 97.4. Pulse is 90 per minute, regular. Respiratory rate 20 per minute, regular. Blood pressure is 128/90 mmHg. HEENT:  Head is normocephalic, atraumatic. Eyes: PERRLA. Fundi bilaterally benign. EOMs intact. Tympanic membranes intact, no exudate. NECK:  Supple without any organomegaly or thyromegaly. CHEST:  Normal expansion. Normal breath sounds. HEART:  Normal S1, S2, without any murmurs or rubs. ABDOMEN:  Soft. No organomegaly. Bowel sounds heard. RECTAL AND  PELVIC:  Deferred. NEUROLOGIC:  Gait is normal. Romberg is negative. Cranial nerves II through XII grossly intact. DTRs 2+ and normal. Plantars have normal response.  MENTAL STATUS EXAMINATION:  The patient is dressed in hospital scrubs. Alert and oriented to place, person and time. Fully aware of situation that brought here for admission to [facility]. Affect is flat but mood with mood depressed. Admits to feeling hopeless and helpless, admits to feeling  depressed, being depressed, admits being withdrawn and not wanting to go out and be with people and not doing the things that she has been doing before for no specific reason. Stays depressed and does admits to having sleep disturbance. No psychosis. Denies auditory or visual hallucinations. Denies hearing voices, denies paranoia or suspicious ideas. Denies thought insertion, thought control. Cognition is intact. General knowledge of information is fair. Could count money, could spell the word "world" forward and backwards though she took some chances. Memory and recall are good. Insight and judgment fair and adequate.  IMPRESSION:  AXIS I:  Major depressive disorder, recurrent, without psychosis, without suicidal ideation. Nicotine dependence. Alcohol abuse with chronic, continuous. AXIS II:   Deferred. AXIS III:  Status post tubal ligation, status post 2 C-sections, hypertension. AXIS IV:  Moderate, a long history of depression, and trying to self-medicate by using alcohol. AXIS V:  GAF:  30.  PLAN: The patient is admitted to [facility] for close observation and management. She will be started on Effexor 37.5 mg which will be slowly increased so that her depression is under control. The patient reports to a lack of sleep for which she was given Ambien 5 mg p.o at bedtime to help with the same. She will be on detox protocol because of her drinking at the rate of 5 beers per day. During the stay in the hospital she will be given milieu therapy and supportive counseling and she will take part in individual and group therapy where substance abuse problems will be addressed. At the time of discharge, detox will be complete and the patient will not be depressed and her depression will be stabilized and appropriate followup appointments will be made in the community along with counseling for substance abuse.  ____________________________ Wallace Cullens. Franchot Mimes, MD skc:jm D: 04/05/2013 16:33:45  ET T: 04/05/2013 16:57:58 ET JOB#: [id number]  cc: Arlyn Leak K. Franchot Mimes, MD, <Dictator> Dewain Penning MD ELECTRONICALLY SIGNED 04/06/2013 21:08
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
## 2015-03-26 NOTE — Consult Note (Signed)
CONSULT: TREATMENT RECOMMENDATIONS Met with [name] as requested by her attending physician to determine eligibility for CD IOP.  She was able to report that she was definitely interested in lifestyle changes and that alcohol had become a problem for her and that she does want treatment after discharge from this level of care.  She agreed to start the CD IOP on Thursday of this week and will report for the assessment and placement for the CD IOP on Thursday at 4 PM.   Electronic Signatures: Laqueta Due (PsyD)  (Signed on 06-May-14 21:50)  Authored  Last Updated: 06-May-14 21:50 by Laqueta Due (PsyD)
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
## 2015-03-26 NOTE — Discharge Summary (Signed)
PATIENT NAME:  [name], [name] MR#:  [id number] DATE OF BIRTH:  [birth year]  DATE OF ADMISSION:  04/04/2013 DATE OF DISCHARGE:  04/08/2013  HOSPITAL COURSE: See dictated history and physical for details of admission.   This 49 year old woman was admitted to the hospital for alcohol abuse and symptoms of depression. She was able to detox readily without any difficulty. She did not have seizures or any signs of delirium. She was cooperative with treatment and attended treatment groups appropriately. Her depression was treated with Lexapro 10 mg once a day, and she was continued on her progesterone, and trazodone was used at night for sleep. The patient consistently denied any suicidal ideation in the hospital. Her mood and affect improved. At the time of discharge she was agreeing to attend intensive outpatient as a followup program and appeared to be motivated and interested in change.   DISCHARGE MEDICATIONS: Lexapro 10 mg per day, progesterone 200 mg a day, trazodone 50 mg at night.   LABORATORY RESULTS: Admission labs showed a CBC with normal hematocrit, hemoglobin, platelets and white count. Chemistries showed creatinine low at 0.57, alkaline phosphatase elevated at 162, AST elevated at 111. Alcohol level initially 281. TSH 0.6, which is normal. Urinalysis unremarkable. Drug screen negative. Followup alcohol level prior to admission 190.   MENTAL STATUS EXAM AT DISCHARGE: Neatly dressed and groomed woman, looks her stated age, cooperative with the interview. Good eye contact, normal psychomotor activity. Speech normal in rate, tone and volume. Affect euthymic, reactive and appropriate. Mood stated as good. Thoughts are lucid, without loosening of associations or delusions. Denies auditory or visual hallucinations. Denies suicidal or homicidal ideation. Shows improved insight and judgment. Normal intelligence.   DISPOSITION: Discharge home.   FOLLOWUP: With IOP.   DIAGNOSES, PRINCIPAL AND PRIMARY:    AXIS I: Alcohol dependence.   SECONDARY DIAGNOSES: AXIS I: Dysthymia.  AXIS II: Deferred.  AXIS III: Status post menopause, using progesterone.  AXIS IV: Severe, ongoing stress from substance abuse.  AXIS V: Functioning at time of discharge: 25.     ____________________________ Gonzella Lex, MD jtc:dm D: 04/13/2013 23:41:00 ET T: 04/14/2013 12:08:41 ET JOB#: [id number]  cc: Gonzella Lex, MD, <Dictator> Gonzella Lex MD ELECTRONICALLY SIGNED 04/14/2013 17:36

## 2015-03-26 NOTE — Consult Note (Signed)
Brief Consult Note: Diagnosis: alcohol dependence.   Patient was seen by consultant.   Consult note dictated.   Discussed with Attending MD.   Comments: Psychiatry: PAtient has been seen and note dictated. Patient denies any suicidal ideation and is lucid and able to make decisions. Refuses my suggestion to be admitted for detox. Counceling about SA treatment done. Patient to continue tx with [facility] IOP and AA.  Electronic Signatures: Gonzella Lex (MD)  (Signed 29-Oct-14 20:04)  Authored: Brief Consult Note   Last Updated: 29-Oct-14 20:04 by Gonzella Lex (MD)

## 2015-03-26 NOTE — Consult Note (Signed)
PATIENT NAME:  [name], [name] MR#:  [id number] DATE OF BIRTH:  1966/10/08  PSYCHIATRY CONSULTATION   DATE OF ADMISSION: 10/27/2013   DATE OF CONSULTATION:  10/28/2013  REFERRING PHYSICIAN:  Wells Guiles L. Reita Cliche, MD CONSULTING PHYSICIAN:  Cordelia Pen. Gretel Acre, MD  REASON FOR CONSULTATION: Alcohol intoxication.   HISTORY OF PRESENT ILLNESS: The patient is a 49 year old married female with long history of alcohol dependence, who presented to the hospital as she was drinking heavily. She reported that she had a rough day yesterday, and she started drinking, although she just came back from the rehab at the [facility] 3 weeks ago. She reported that she was sober for a while and was doing good. However, she relapsed within 2 weeks. She reported that she was worried about something, and then she could not control herself. She also mentioned that she fell and broke her right shoulder on November 1st. She reported that she has an injury in her humerus and is currently wearing a cast. The patient reported that somebody brought liquor and wine to her, and she consumed approximately 2 little bottles with 12% alcohol in it. She passed out and called 911 by herself. Her husband was working at that time. She reported that her 46-year-old daughter was at daycare at that time. The patient reported that she was unable to control her behavior, although she was getting medications from her psychiatrist, Dr. Silvio Pate, including Antabuse, Lexapro and clonazepam. The patient reported that she has been attending intensive outpatient program at [facility] and will go there on Monday, Wednesday and Friday, but she skipped the program yesterday. She also has a sponsor, and she was supposed to call her, but she decided not to call her. The patient reported that she is currently feeling anxious, but she denied having any suicidal ideations. She denied having any psychosis and paranoia. She reported that she has been feeling  depressed lately because something was bothering her, and she was unable to control her impulsive behavior and started drinking instead.   PAST PSYCHIATRIC HISTORY: The patient has a history of admissions in the past related to detox and has attended the intensive outpatient program with Dr. Grandville Silos; however, when Dr. Grandville Silos became ill, she started drinking again. The patient reported that she has been feeling depressed, hopeless and helpless recently. She also has been to the [facility] and is currently getting treatment at [facility] in [place]. She denied any history of suicide attempt. She is currently getting treatment with Lexapro as well as clonazepam.   SOCIAL HISTORY: The patient currently lives with her husband and 37-year-old daughter. She also has a 62 year old who does not live at home. The patient used to work as a Marine scientist, but she does not have an active license at this time. Her husband appears supportive of her treatment.   PAST MEDICAL HISTORY: The patient reported that she is taking progesterone due to her menopause.   CURRENT MEDICATIONS: Lexapro 20 mg daily, clonazepam 1 mg b.i.d., progesterone 200 mg daily, Antabuse 250 mg p.o. daily.   ALLERGIES: HYDROCODONE.   REVIEW OF SYSTEMS:  CONSTITUTIONAL: Denies any fever or chills. No weight changes.  EYES: No double or blurred vision.  RESPIRATORY: No shortness of breath or cough.  CARDIOVASCULAR: No chest pain or orthopnea.  GASTROINTESTINAL: No abdominal pain, nausea, vomiting, diarrhea.  GENITOURINARY: No incontinence or frequency.  ENDOCRINE: No heat or cold intolerance.  LYMPHATIC: No anemia or easy bruising.  INTEGUMENTARY: No acne or rash.  MUSCULOSKELETAL: Some shaking noted.   MENTAL STATUS EXAMINATION: The patient is a moderately built female who appeared her stated age. She was cooperative with the interview. Her eye contact was good. Psychomotor activity was retarded. Speech was low in tone  and volume. Mood was depressed. Affect was anxious. Thought process was logical. She denied suicidal ideations or plans. She remains evasive and was minimizing her use of alcohol. She was awake, alert and oriented.   VITAL SIGNS: Temperature 97.9, pulse 84, respirations 20, blood pressure 149/77.   LABORATORY DATA: Glucose 172, BUN 8, creatinine 0.51, sodium 139, potassium 3.3, chloride 106, bicarbonate 21, GFR 60, anion gap 12, osmolality 280, calcium 8.8. Ethanol level 262. Protein 6.9, albumin 3.7, bilirubin 0.3, alkaline phosphatase 116, AST 21, ALT 15, CK 34. UDS negative. WBC 5.5, hemoglobin 13.9, platelet count 179, RDW 14.9.   DIAGNOSTIC IMPRESSION:  AXIS I:  1. Alcohol dependence.  2. Alcohol withdrawal.   TREATMENT PLAN: The patient will be admitted to the inpatient behavioral health unit for stabilization and safety as she is unable to contract for safety and continues drinking in spite of getting Antabuse, clonazepam and Lexapro from her outpatient psychiatrist. She continues to relapse although attending IOP programs and having a sponsor at this time. She is a risk to herself and has been making bad choices. The patient will benefit from getting inpatient treatment at this time. She will be started on Librium 25 mg p.o. q.6 hours for the next 3 days. She will continue on her other medications as prescribed.   Thank you for allowing me to participate in the care of this patient.   ____________________________ Cordelia Pen. Gretel Acre, MD usf:lb D: 10/28/2013 13:24:02 ET T: 10/28/2013 13:47:09 ET JOB#: [id number]  cc: Cordelia Pen. Gretel Acre, MD, <Dictator> Jeronimo Norma MD ELECTRONICALLY SIGNED 11/06/2013 11:14

## 2015-03-27 NOTE — Discharge Summary (Signed)
PATIENT NAME:  ZYA, FINKLE MR#:  [id number] DATE OF BIRTH:  [birth year]  DATE OF ADMISSION:  06/30/2014 DATE OF DISCHARGE:  07/02/2014  REASON FOR ADMISSION:  The patient presented to our Emergency Department while intoxicated after she had voiced thoughts of suicide and the plan to shoot herself with her husband's guns to her friend.    DISCHARGE DIAGNOSES: AXIS I: Major depressive disorder, recurrent.   Alcohol use disorder, severe. AXIS II: Affair.  AXIS III: Lupus cutaneous.   Left arm fracture.  AXIS IV: Financial stressors.  The patient is filing for bankruptcy.  Relationship problems    with spouse. 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  AXIS V: GAF of 45.   DISCHARGE MEDICATIONS: Lexapro 20 mg p.o. daily for depression, trazodone 200 mg p.o. at bedtime for insomnia, Antabuse 250 mg p.o. q. daily for alcohol dependence.  This medication will be administered by the patient's husband.  Progesterone 200 mg p.o. q. daily for menopause, ibuprofen 800 mg every 8 hours as needed for pain, Tylenol 650 mg p.o. q. 8 hours as needed for pain.    HOSPITAL COURSE: The patient was admitted to our facility after she presented to the Emergency Department intoxicated with an alcohol level of 151.  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  The patient completed inpatient rehabilitation at a private facility and after that started attending intensive outpatient substance abuse with [facility] in [place], [place]. The patient has completed her outpatient treatment and is now attending appointments, per her report, once a month with her therapist and every 3 months with her psychiatrist.  The patient has continued to drink several times a week since November of last year.  At that time, she fell and broke her left arm which required surgery.  She has had multiple complications  from this surgery and a few months ago undergo underwent surgery again.  The patient stated the plates and the screws that were placed in the fractured arm are misplaced and causing significant pain.  Since November, the trigger for the relapse has been her level of pain.    The patient is also facing bankruptcy.  She has significant decrease in her income as a result of losing her job last year.  Her husband is employed, but his income does not cover all of their expenses.  They have been unable to pay their house over the last 3 months.  The patient also reports she did not pay the gas bill, and they do not have hot water in the house.  They are now filing for bankruptcy in order to keep their house.    The patient denied being suicidal.  She stated that she was intoxicated and does not remember saying that she wanted to hurt herself.  She denied any past history of suicidal attempts and denied having any access to weapons.   Collateral information was obtained from the patient's husband.  He, as well, did not have any concerns about the patient's safety if discharged from the hospital.  He stated that he thought she was doing it because she was intoxicated and perhaps wanted some attention. Her husband believes the patient is only drinking once a month, and the patient reported drinking several times a week.  They are having significant marital problems as well.  The patient acknowledged having an extramarital affair, but  just recently.  The patient and husband both agreed to attend family therapy that was recommended for them and to make arrangements to followup at the [facility] with a family therapist.    The [facility] was also contacted, and they state that the patient has not been going to her appointments as often as she should.  She missed her last appointment with her substance abuse counselor and she was last seen by her in April.  She also is only seeing her psychiatrist about every 3  months.  Her last appointment was in June.  The patient was advised to followup with the [facility] on a weekly basis since she has relapsed into alcohol.  The patient states that she has been attending AA and has a supportive sponsor.  However, she did not contact her sponsor during this last relapse.  The patient states that the [facility] had provided her with a prescription for disulfiram.  She, however, only took it for a short period of time and then because she wanted to continue drinking, she stopped the medication.  However, she felt that for 2 weeks while she took it, it was helpful to help her maintain sobriety.    During this hospitalization, the patient did not show any signs of withdrawal.  There were no issues related to her health.  This hospitalization was uneventful.  The patient did not require any seclusions or restraints.  The patient was pleasant and cooperative.  At the time of discharge, the patient was alert and oriented.  She was pleasant and cooperative.  Her mood was improved, however, still reported depression and feelings of guilt as a result of her relapse and losing her income due to the alcoholism.  The patient denied any significant difficulties with sleep, appetite, energy or concentration.  She denied suicidality, homicidality or psychosis.  Once again, the patient's husband did not have any concerns about the patient's safety.    At discharge, her acute risk for suicide was low.  The husband confirms that the patient has no access to weapons and does not have any prior history of suicidal attempts.  Current risk for suicide is moderate as the patient suffers from depression, has significant alcoholism, financial stressors and loss of her ability to practice her profession.  DISCHARGE DISPOSITION: The patient will be discharged back to her home.  At this point in time, she is not interested in returning to inpatient rehabilitation even though this was advised.     DISCHARGE FOLLOWUP: The patient will continue to followup with the [facility], where she follows up with psychiatry and substance abuse counselor.  A copy of the discharge summary will be provided to them to assure continuity of care.  The patient and her husband have agreed to followup with family therapy.  They were advised to set up this appointment with the [facility].  Also, the patient's husband has agreed to dispense, himself, the disulfiram to the patient on a daily basis to assure that she will take it.  The patient has agreed with this recommendation.       ____________________________ Hildred Priest, MD ahg:[id number] D: 07/02/2014 16:55:00 ET T: 07/02/2014 17:04:05 ET JOB#: [id number]  cc: Hildred Priest, MD, <Dictator> Rhodia Albright MD ELECTRONICALLY SIGNED 07/08/2014 18:18

## 2015-03-27 NOTE — Consult Note (Signed)
PATIENT NAME:  [name], [name] MR#:  [id number] DATE OF BIRTH:  08-19-1966  DATE OF CONSULTATION:  06/30/2014  REFERRING PHYSICIAN:   CONSULTING PHYSICIAN:  Jerimey Burridge S. Gretel Acre, MD  REASON FOR CONSULTATION: "I've been drinking really, really heavily for the past 2 weeks. I will go out to buy groceries and I will drink alcohol. I do not remember what had happened after that."   HISTORY OF PRESENT ILLNESS: The patient is a 49 year old married female with long history of alcoholism and depression, who was brought to the ED, and she does not know after arriving to the ED, what has happened. She admits to drinking heavily and has been feeling depressed. She reported that she is in a lot of pain since her surgery, in her right arm, from [facility]. She reported that she is getting physical therapy, but the pain is unbearable. She drinks to mask the pain. Reported that her family is going through financial difficulties, and they are applying for bankruptcy on Friday. The patient reported that she has been out of work due to the surgery. The patient reported that she relapsed after her surgery and has been drinking heavily since then; however, for the past 2 weeks, she has started drinking very heavily and is unable to remember, and passes out quickly. She admits to feeling depressed, hopeless, helpless, and is unable to contract for safety at this time. She reported that she has withdrawal symptoms including tremors, but denied having any seizures. Reported that her friends came around at home and then they became intoxicated at that time. The patient was given Antabuse in the past, and she took it, and was able to remain sober for 2 weeks, but currently she does not have any period of sobriety. The patient does not have any paranoia or psychotic symptoms at this time.   PAST PSYCHIATRIC HISTORY: The patient has a long history of drinking, as well as depression. She went to [facility] in the past. She has  history of attempted suicide, as well.   FAMILY PSYCHIATRIC HISTORY: The patient is adopted, so she is not sure of her birth history.   PAST MEDICAL HISTORY: Hypertension, which is under control at this time.   ALLERGIES: HYDROCODONE.   CURRENT MEDICATIONS: Trazodone 50 mg at bedtime, progesterone 200 mg at bedtime, Lexapro 20 mg p.o. daily, Dilaudid 2 mg q.4 h. p.r.n. for pain.   SOCIAL HISTORY: The patient is currently married for 24 years. She has an 78 year old son and a 69-year-old daughter. Her husband has been supportive and he is currently employed. They are applying for the bankruptcy on Friday. She reported that she has an [facility] sponsor and she was attending the [facility] in the past.   REVIEW OF SYSTEMS:  CONSTITUTIONAL: No fever or chills. Has a chronic pain in her arm.  EYES: No double or blurred vision.  ENT: No hearing loss.  RESPIRATORY: No shortness of breath or cough.  CARDIOVASCULAR: Denies chest pain or orthopnea.  GASTROINTESTINAL: No abdominal pain, nausea, vomiting or diarrhea.  GENITOURINARY: No incontinence or frequency.  ENDOCRINE: No heat or cold intolerance.  LYMPHATIC: No anemia or easy bruising.  INTEGUMENTARY: No acne or rash.  MUSCULOSKELETAL: Having muscle pain and joint pain.   PHYSICAL EXAMINATION:  VITAL SIGNS: Temperature 98.4, pulse 61, respirations 18, blood pressure 144/91.  LABORATORY DATA: Glucose 78, BUN 14, creatinine 0.62, sodium 145, potassium 3.5, chloride 114, bicarbonate 23, anion gap 8, osmolality 288, calcium 8.5. Blood alcohol level 151.  UDS is positive for the opioids. WBC 7.7, RBC 4.23, hemoglobin 13.7, hematocrit 42.1, platelet count 190,000. MCV is 99.   MENTAL STATUS EXAMINATION: The patient is a moderately built female, who was lying in the bed. She was dozing off and was barely able to keep her eyes open. She maintained poor eye contact. Her speech was low in tone and volume. Mood was depressed and blunted. Thought process was  logical. Thought content was nondelusional. Her cognition was fine. Her insight and judgment were poor regarding her use of alcohol, as well as about her depression.   DIAGNOSTIC IMPRESSION:  AXIS I: Major depressive disorder, recurrent, severe. Alcohol dependence.  AXIS II: None. AXIS III: recent surgery in  right arm.   PLAN:  1. The patient will be admitted to the inpatient behavioral health unit for stabilization and safety.  2. She will be monitored for safety behavior.  3. She will be continued on a CIWA protocol.  4. The patient be evaluated by the treatment team, and her medications will be adjusted.   Thank you for allowing me to participate in the care of this patient.    ____________________________ Cordelia Pen. Gretel Acre, MD usf:jr D: 06/30/2014 14:55:31 ET T: 06/30/2014 15:25:24 ET JOB#: [id number]  cc: Cordelia Pen. Gretel Acre, MD, <Dictator> Jeronimo Norma MD ELECTRONICALLY SIGNED 07/02/2014 21:27

## 2015-08-31 IMAGING — CT CT HUMERUS*R* W/O CM
3 of 4 series · 11 of 33 positions shown, 13 images · non-contrast
Comparison: Radiographs 10/05/2013

CLINICAL DATA: Right humerus fracture.  Evaluate nonunion.

EXAM:
CT OF THE RIGHT HUMERUS WITHOUT CONTRAST
TECHNIQUE: Multidetector CT imaging was performed according to the standard
protocol. Multiplanar CT image reconstructions were also generated.

[Series 12: humerus soft tissue axial · axial · 0.36mm/px · z∈[-488,-245]mm · 4 of 178 slices shown, 5 images]
[im 28/178  soft-tissue]
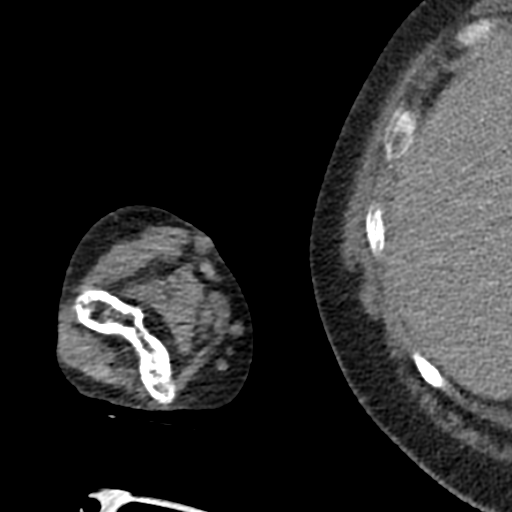
[im 28/178  bone]
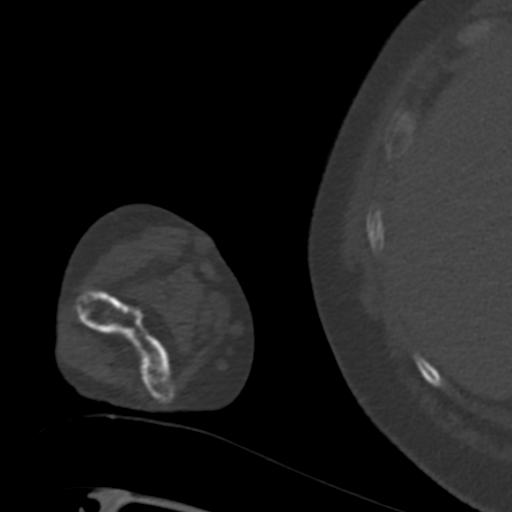
[im 69/178  bone]
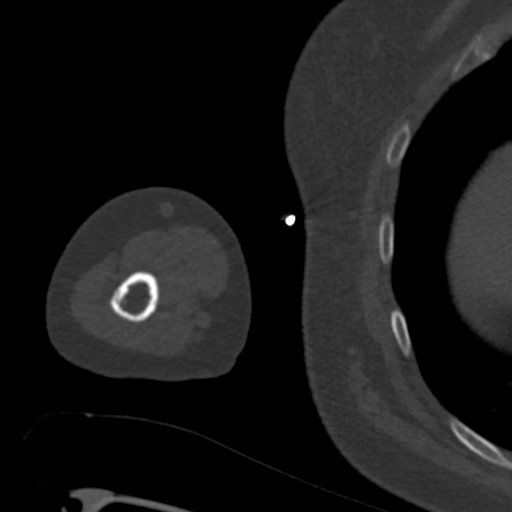
[im 109/178  bone]
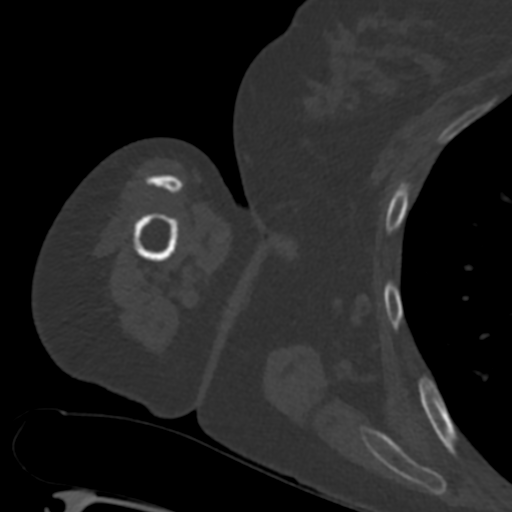
[im 150/178  bone]
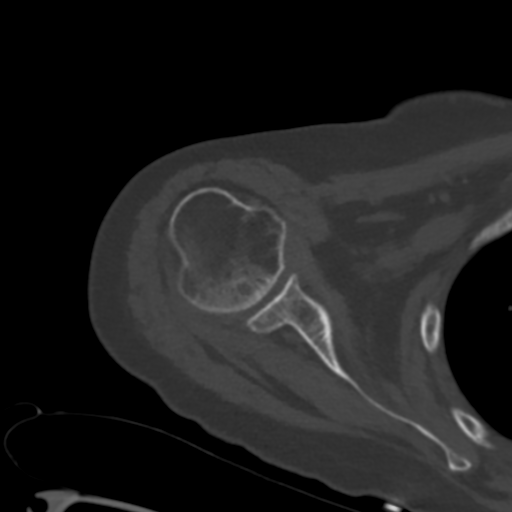

[Series 14: humerus soft tissue sagittal · sagittal · 0.36mm/px · 5 of 77 slices shown, 6 images]
[im 26/77  bone]
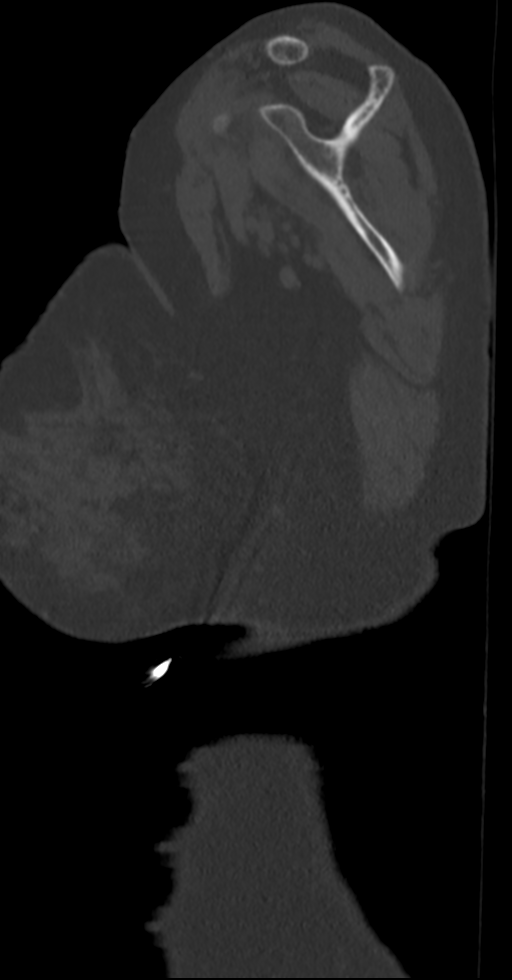
[im 32/77  bone]
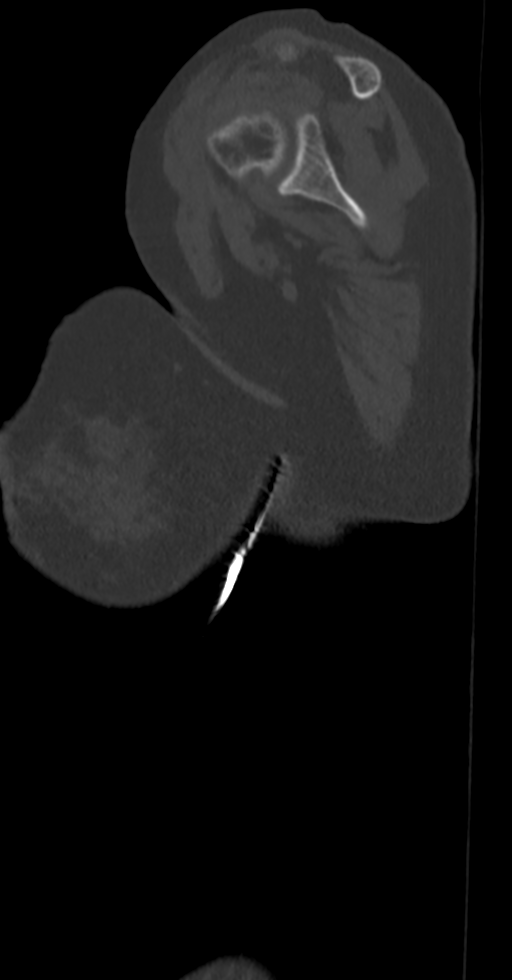
[im 39/77  soft-tissue]
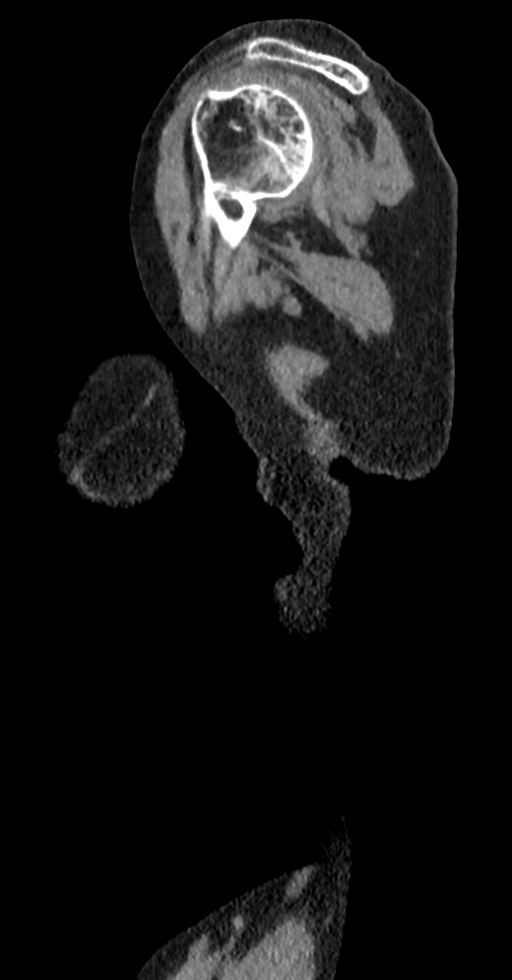
[im 39/77  bone]
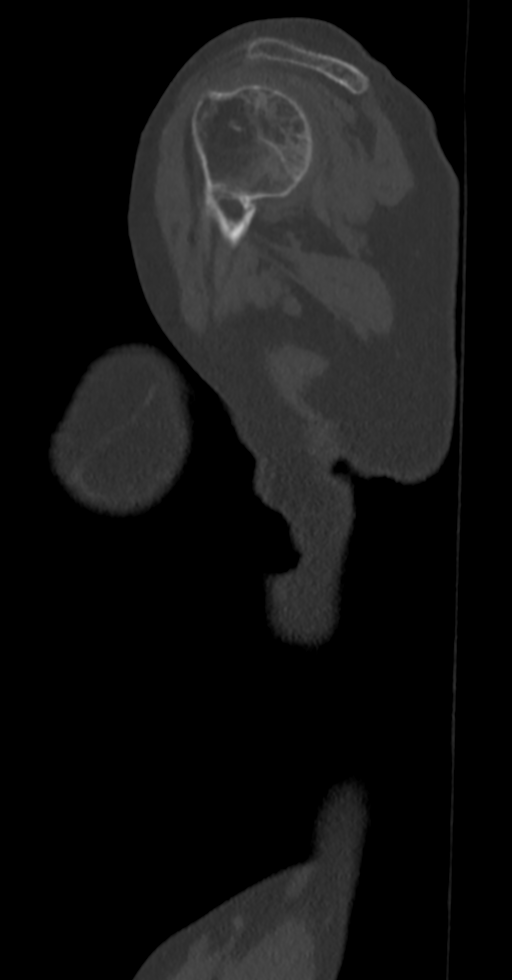
[im 45/77  bone]
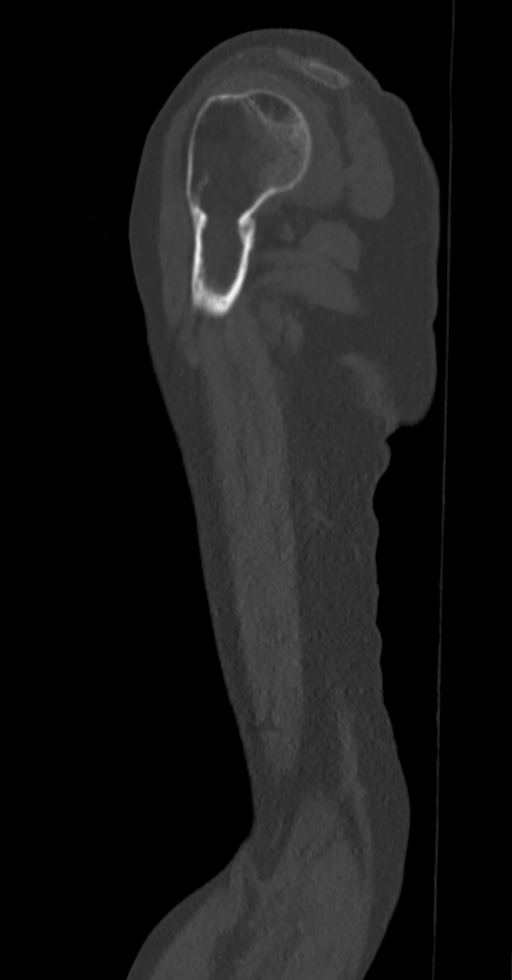
[im 51/77  bone]
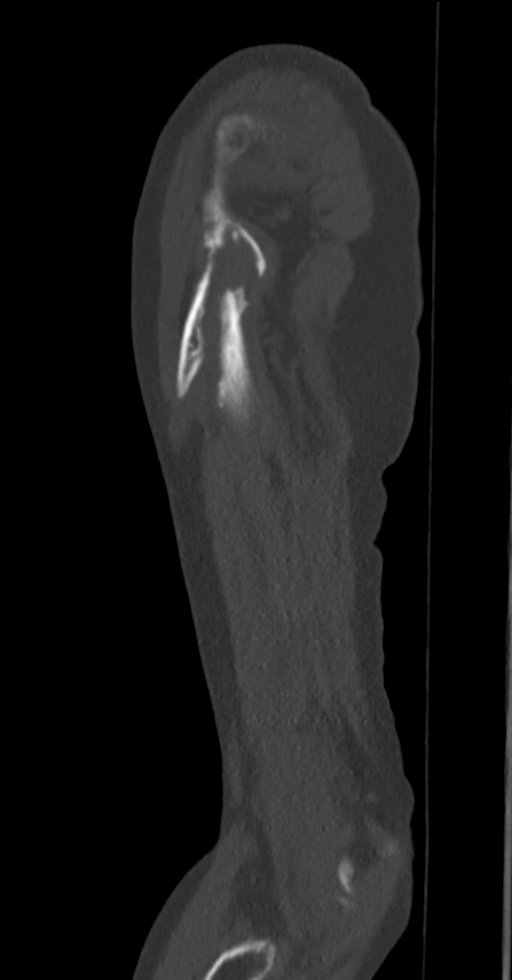

[Series 15: humerus soft tissue coronal · coronal · 0.36mm/px · 2 of 93 slices shown]
[im 31/93  bone]
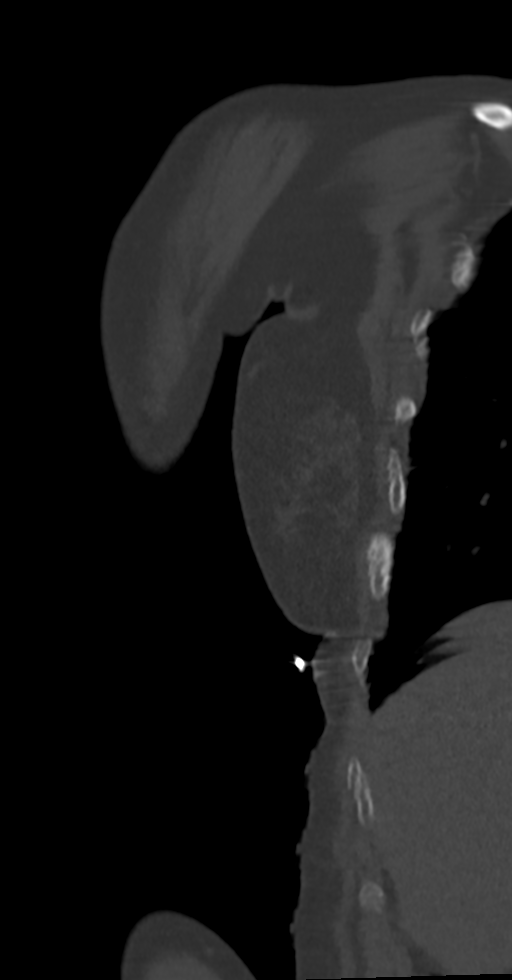
[im 62/93  bone]
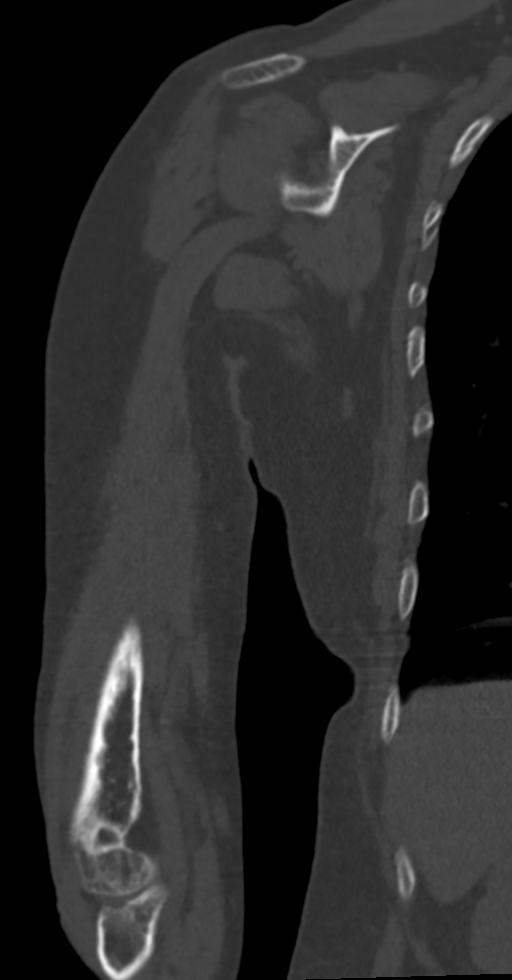

[11 of 33 positions shown; findings below may reference images not displayed]

FINDINGS: Ununited spiral type fracture of the upper right humeral shaft.
There is lateral and posterior displacement of the fracture. The
margins are smooth. Maximum displacement is 11.5 mm. The upper part
of this fracture which extended into the humeral neck has healed.
The glenohumeral joint is maintained. No glenoid fracture. The AC
joint is intact. No obvious large rotator cuff tear.

There are healed right rib fractures. Significant breathing motion
artifact is noted. The right lung is grossly clear.
IMPRESSION: The upper aspect of the humerus fracture has healed. The lower
portion is ununited with maximal distraction of 11.5 mm.

## 2015-10-06 ENCOUNTER — Inpatient Hospital Stay (HOSPITAL_COMMUNITY)
Admission: EM | Admit: 2015-10-06 | Discharge: 2015-10-09 | DRG: 897 | Disposition: A | Discharge status: Home / Self Care | Payer: 59 | Source: Intra-hospital | Attending: Psychiatry | Admitting: Psychiatry

## 2015-10-06 ENCOUNTER — Emergency Department (HOSPITAL_COMMUNITY)
Admission: EM | Admit: 2015-10-06 | Discharge: 2015-10-06 | Disposition: A | Discharge status: Other Acute Inpatient Hospital | Payer: 59 | Attending: Emergency Medicine | Admitting: Emergency Medicine

## 2015-10-06 ENCOUNTER — Encounter (HOSPITAL_COMMUNITY): Payer: Self-pay | Admitting: Family Medicine

## 2015-10-06 DIAGNOSIS — F1010 Alcohol abuse, uncomplicated: Secondary | ICD-10-CM | POA: Insufficient documentation

## 2015-10-06 DIAGNOSIS — Z7982 Long term (current) use of aspirin: Secondary | ICD-10-CM | POA: Insufficient documentation

## 2015-10-06 DIAGNOSIS — Z720 Tobacco use: Secondary | ICD-10-CM | POA: Insufficient documentation

## 2015-10-06 DIAGNOSIS — Z862 Personal history of diseases of the blood and blood-forming organs and certain disorders involving the immune mechanism: Secondary | ICD-10-CM | POA: Diagnosis not present

## 2015-10-06 DIAGNOSIS — I10 Essential (primary) hypertension: Secondary | ICD-10-CM | POA: Diagnosis not present

## 2015-10-06 DIAGNOSIS — Z79899 Other long term (current) drug therapy: Secondary | ICD-10-CM | POA: Insufficient documentation

## 2015-10-06 DIAGNOSIS — F909 Attention-deficit hyperactivity disorder, unspecified type: Secondary | ICD-10-CM | POA: Diagnosis not present

## 2015-10-06 DIAGNOSIS — Z8719 Personal history of other diseases of the digestive system: Secondary | ICD-10-CM | POA: Diagnosis not present

## 2015-10-06 DIAGNOSIS — F419 Anxiety disorder, unspecified: Secondary | ICD-10-CM | POA: Insufficient documentation

## 2015-10-06 DIAGNOSIS — F1014 Alcohol abuse with alcohol-induced mood disorder: Principal | ICD-10-CM | POA: Diagnosis present

## 2015-10-06 DIAGNOSIS — F329 Major depressive disorder, single episode, unspecified: Secondary | ICD-10-CM | POA: Diagnosis present

## 2015-10-06 DIAGNOSIS — F411 Generalized anxiety disorder: Secondary | ICD-10-CM

## 2015-10-06 DIAGNOSIS — R45851 Suicidal ideations: Secondary | ICD-10-CM

## 2015-10-06 DIAGNOSIS — Z8551 Personal history of malignant neoplasm of bladder: Secondary | ICD-10-CM | POA: Insufficient documentation

## 2015-10-06 DIAGNOSIS — F900 Attention-deficit hyperactivity disorder, predominantly inattentive type: Secondary | ICD-10-CM | POA: Diagnosis not present

## 2015-10-06 DIAGNOSIS — Y908 Blood alcohol level of 240 mg/100 ml or more: Secondary | ICD-10-CM | POA: Diagnosis present

## 2015-10-06 DIAGNOSIS — F17210 Nicotine dependence, cigarettes, uncomplicated: Secondary | ICD-10-CM | POA: Diagnosis present

## 2015-10-06 LAB — CBC
HEMATOCRIT: 46 % (ref 36.0–46.0)
HEMOGLOBIN: 14.8 g/dL (ref 12.0–15.0)
MCH: 31.2 pg (ref 26.0–34.0)
MCHC: 32.2 g/dL (ref 30.0–36.0)
MCV: 96.8 fL (ref 78.0–100.0)
Platelets: 191 10*3/uL (ref 150–400)
RBC: 4.75 MIL/uL (ref 3.87–5.11)
RDW: 12.6 % (ref 11.5–15.5)
WBC: 5.3 10*3/uL (ref 4.0–10.5)

## 2015-10-06 LAB — COMPREHENSIVE METABOLIC PANEL
ALT: 31 U/L (ref 14–54)
ANION GAP: 11 (ref 5–15)
AST: 28 U/L (ref 15–41)
Albumin: 4 g/dL (ref 3.5–5.0)
Alkaline Phosphatase: 98 U/L (ref 38–126)
BUN: 13 mg/dL (ref 6–20)
CHLORIDE: 109 mmol/L (ref 101–111)
CO2: 26 mmol/L (ref 22–32)
Calcium: 8.9 mg/dL (ref 8.9–10.3)
Creatinine, Ser: 0.72 mg/dL (ref 0.44–1.00)
Glucose, Bld: 114 mg/dL — ABNORMAL HIGH (ref 65–99)
POTASSIUM: 4.4 mmol/L (ref 3.5–5.1)
Sodium: 146 mmol/L — ABNORMAL HIGH (ref 135–145)
Total Bilirubin: 0.2 mg/dL — ABNORMAL LOW (ref 0.3–1.2)
Total Protein: 6.4 g/dL — ABNORMAL LOW (ref 6.5–8.1)

## 2015-10-06 LAB — ETHANOL: ALCOHOL ETHYL (B): 231 mg/dL — AB (ref <5)

## 2015-10-06 MED ORDER — ONDANSETRON HCL 4 MG PO TABS: 4.0000 mg | ORAL_TABLET | Freq: Three times a day (TID) | ORAL | Status: DC | PRN

## 2015-10-06 MED ORDER — CHLORDIAZEPOXIDE HCL 25 MG PO CAPS: 25.0000 mg | ORAL_CAPSULE | Freq: Four times a day (QID) | ORAL | Status: DC | PRN

## 2015-10-06 MED ORDER — HYDROXYZINE HCL 25 MG PO TABS: 25.0000 mg | ORAL_TABLET | Freq: Four times a day (QID) | ORAL | Status: DC | PRN

## 2015-10-06 MED ORDER — TRAZODONE HCL 50 MG PO TABS
ORAL_TABLET | ORAL | Status: AC
  Filled 2015-10-06: qty 1

## 2015-10-06 MED ORDER — ESCITALOPRAM OXALATE 20 MG PO TABS
20.0000 mg | ORAL_TABLET | Freq: Every day | ORAL | Status: DC
  Administered 2015-10-07: 20 mg via ORAL
  Filled 2015-10-06: qty 2
  Filled 2015-10-06: qty 1

## 2015-10-06 MED ORDER — ALUM & MAG HYDROXIDE-SIMETH 200-200-20 MG/5ML PO SUSP: 30.0000 mL | ORAL | Status: DC | PRN

## 2015-10-06 MED ORDER — LORAZEPAM 1 MG PO TABS: 0.0000 mg | ORAL_TABLET | Freq: Two times a day (BID) | ORAL | Status: DC

## 2015-10-06 MED ORDER — IBUPROFEN 400 MG PO TABS: 600.0000 mg | ORAL_TABLET | Freq: Three times a day (TID) | ORAL | Status: DC | PRN

## 2015-10-06 MED ORDER — ESCITALOPRAM OXALATE 10 MG PO TABS
20.0000 mg | ORAL_TABLET | Freq: Every day | ORAL | Status: DC
  Filled 2015-10-06: qty 2

## 2015-10-06 MED ORDER — CHLORDIAZEPOXIDE HCL 25 MG PO CAPS: 25.0000 mg | ORAL_CAPSULE | ORAL | Status: DC

## 2015-10-06 MED ORDER — CHOLECALCIFEROL 25 MCG (1000 UT) PO TBDP
1000.0000 [IU] | ORAL_TABLET | Freq: Every day | ORAL | Status: DC
Start: 2015-10-06 — End: 2015-10-06

## 2015-10-06 MED ORDER — MAGNESIUM HYDROXIDE 400 MG/5ML PO SUSP: 30.0000 mL | Freq: Every day | ORAL | Status: DC | PRN

## 2015-10-06 MED ORDER — LOPERAMIDE HCL 2 MG PO CAPS: 2.0000 mg | ORAL_CAPSULE | ORAL | Status: DC | PRN

## 2015-10-06 MED ORDER — ZOLPIDEM TARTRATE 5 MG PO TABS: 5.0000 mg | ORAL_TABLET | Freq: Every evening | ORAL | Status: DC | PRN

## 2015-10-06 MED ORDER — ADULT MULTIVITAMIN W/MINERALS CH
1.0000 | ORAL_TABLET | Freq: Every day | ORAL | Status: DC
  Administered 2015-10-07: 1 via ORAL
  Filled 2015-10-06 (×2): qty 1

## 2015-10-06 MED ORDER — THIAMINE HCL 100 MG/ML IJ SOLN: 100.0000 mg | Freq: Once | INTRAMUSCULAR | Status: DC

## 2015-10-06 MED ORDER — VITAMIN D 1000 UNITS PO TABS
1000.0000 [IU] | ORAL_TABLET | Freq: Every day | ORAL | Status: DC
  Administered 2015-10-06: 1000 [IU] via ORAL
  Filled 2015-10-06: qty 1

## 2015-10-06 MED ORDER — CHLORDIAZEPOXIDE HCL 25 MG PO CAPS: 25.0000 mg | ORAL_CAPSULE | Freq: Three times a day (TID) | ORAL | Status: DC

## 2015-10-06 MED ORDER — CHLORDIAZEPOXIDE HCL 25 MG PO CAPS
25.0000 mg | ORAL_CAPSULE | Freq: Four times a day (QID) | ORAL | Status: DC
  Filled 2015-10-06 (×2): qty 1

## 2015-10-06 MED ORDER — VITAMIN B-1 100 MG PO TABS
100.0000 mg | ORAL_TABLET | Freq: Every day | ORAL | Status: DC
  Administered 2015-10-07: 100 mg via ORAL
  Filled 2015-10-06 (×2): qty 1

## 2015-10-06 MED ORDER — ONDANSETRON 4 MG PO TBDP: 4.0000 mg | ORAL_TABLET | Freq: Four times a day (QID) | ORAL | Status: DC | PRN

## 2015-10-06 MED ORDER — CHLORDIAZEPOXIDE HCL 25 MG PO CAPS: 25.0000 mg | ORAL_CAPSULE | Freq: Every day | ORAL | Status: DC

## 2015-10-06 MED ORDER — TRAZODONE HCL 50 MG PO TABS
50.0000 mg | ORAL_TABLET | Freq: Every evening | ORAL | Status: DC | PRN
  Filled 2015-10-06 (×2): qty 1

## 2015-10-06 MED ORDER — LORAZEPAM 1 MG PO TABS: 1.0000 mg | ORAL_TABLET | Freq: Three times a day (TID) | ORAL | Status: DC | PRN

## 2015-10-06 MED ORDER — LORAZEPAM 1 MG PO TABS: 0.0000 mg | ORAL_TABLET | Freq: Four times a day (QID) | ORAL | Status: DC

## 2015-10-06 NOTE — H&P (Signed)
Psychiatric Admission Assessment Adult  Patient Identification: [name] MRN:  [id number] Date of Evaluation:  10/06/2015 Chief Complaint:   Suicidal ideations Principal Diagnosis: Alcohol induced mood disorder, ADHD, GAD Patient Active Problem List   Diagnosis Date Noted  . Alcohol abuse with alcohol-induced mood disorder ([place]) [F10.14] 10/06/2015  . Fracture of proximal humerus with nonunion [S42.209K] 04/28/2014   History of Present Illness: [name] is a 49 y/o WF who earlier today sent a text to her therapist endorsing SI. She was upset because she started drinking again after one month of sobriety. She has a long history of ETOH abuse, with the longest duration of sobriety being 6 months. She patient reportedly drank 1/2 bottle Vodka. Identified stressors include New Job, working 6 days a week and not participating in [employer] as regularly. The patient is denying any SI/SA or HI at this present time. Rest of subjective findings are obtained per review of records via EPIC:  [name] is an 49 y.o. female with history of ADHD, Anxiety, Depression and Alcoholism. She was referred to [facility] after endorsing suicidal thoughts via text to her therapist Sharmon Revere [phone]. Writer contacted Lattie Haw who explains she has provided therapy to patient for several years (CD-IOP, DVT, individual, etc.). Lattie Haw sts that patient has a diagnosis of depression, anxiety, and suffers from a long history of alcohol abuse. Sts that although patient has struggled with abstinence from chronic alcohol abuse over the past 6 months she has done well. Patient has remained sober for 6 months which is also her longest period of sobriety.. Patient is dedicated to therapy and self help programs to continue her abstinence. Lattie Haw sts that patient has recently began missing appointments and this is unusual for her. Patient missed her appointment with Lattie Haw today. Lattie Haw called to check on patient but she didn't answer.  Later on today Lattie Haw received text from patient which stated, "I am done", "Goodbye", "I am finish living", "I am checking out", "Thanks for your help", "This is the end", and "I love you". Patient made these statements to end her life and this was triggered by a relapse of alcohol. Today patient admits to drinking 1/2 a bottle of Vodka. Lattie Haw has worked with patient through many relapses before but never has patient endorsed suicidal thoughts. Lattie Haw is also not aware of patient having a previous suicide attempt/gesture.   Today patient admits that she felt suicidal earlier. She sts, "I just had a bad day" She currently denies SI, HI, and AVH's. Patient denies history of suicidal attempts/gestures. She also denies history of self mutilating behaviors.She participated in rehabilitation programs in [place] (2x's) in the past.  Associated Signs/Symptoms: Depression Symptoms:  feelings of worthlessness/guilt, suicidal thoughts without plan, (Hypo) Manic Symptoms:  denies Anxiety Symptoms:  denies Agoraphobia or panic attacks Psychotic Symptoms:  denies PTSD Symptoms: Negative Total Time spent with patient: 30 minutes  Past Psychiatric History: ADHD, GAD, Alcohol induced mood disorder            Risk to Self:  no Risk to Others:  no Prior Inpatient Therapy:  yes Prior Outpatient Therapy:  yes  Alcohol Screening:   Substance Abuse History in the last 12 months:  Yes.   Consequences of Substance Abuse: Family Consequences:  [place] home and depleted financial resources Previous Psychotropic Medications: Yes  Psychological Evaluations: Yes  Past Medical History:  Past Medical History  Diagnosis Date  . Hypertension   . ADHD (attention deficit hyperactivity disorder)   .  Anxiety   . SUI (stress urinary incontinence, female)   . History of anemia   . GERD (gastroesophageal reflux disease)   . Cutaneous lupus erythematosus   . Cancer ([place])     bladder  . Anemia     Past Surgical  History  Procedure Laterality Date  . Cesarean section  12-23-2004     W/ LEFT TUBAL LIGATION AND RIGHT SALPINGECTOMY  . Cesarean section  1995  . Bladder suspension N/A 02/13/2013    Procedure: [facility] SLING;  Surgeon: Malka So, MD;  Location: [facility];  Service: Urology;  Laterality: N/A;  . Tubal ligation    . Orif proximal humerus fracture Right 04/28/2014    DR CHANDLER  . Orif humerus fracture Right 04/28/2014    Procedure: OPEN REDUCTION INTERNAL FIXATION (ORIF) PROXIMAL HUMERUS FRACTURE;  Surgeon: Nita Sells, MD;  Location: [facility];  Service: Orthopedics;  Laterality: Right;   Family History: No family history on file. Family Psychiatric  History: N/A Social History:  History  Alcohol Use  . Yes    Comment: was in recovery for 4 months.      History  Drug Use No    Social History   Social History  . Marital Status: Married    Spouse Name: N/A  . Number of Children: N/A  . Years of Education: N/A   Social History Main Topics  . Smoking status: Current Every Day Smoker -- 0.50 packs/day for 20 years    Types: Cigarettes  . Smokeless tobacco: Never Used  . 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   . Drug Use: No  . Sexual Activity: Not on file   Other Topics Concern  . Not on file   Social History Narrative   Additional Social History:                         Allergies:   Allergies  Allergen Reactions  . Vicodin [Hydrocodone-Acetaminophen] Nausea And Vomiting and Other (See Comments)    dizzy   Lab Results:  Results for orders placed or performed during the hospital encounter of 10/06/15 (from the past 48 hour(s))  Comprehensive metabolic panel     Status: Abnormal   Collection Time: 10/06/15  1:59 PM  Result Value Ref Range   Sodium 146 (H) 135 - 145 mmol/L   Potassium 4.4 3.5 - 5.1 mmol/L   Chloride 109 101 - 111 mmol/L   CO2 26 22 - 32 mmol/L   Glucose, Bld 114 (H) 65 - 99 mg/dL   BUN 13  6 - 20 mg/dL   Creatinine, Ser 0.72 0.44 - 1.00 mg/dL   Calcium 8.9 8.9 - 10.3 mg/dL   Total Protein 6.4 (L) 6.5 - 8.1 g/dL   Albumin 4.0 3.5 - 5.0 g/dL   AST 28 15 - 41 U/L   ALT 31 14 - 54 U/L   Alkaline Phosphatase 98 38 - 126 U/L   Total Bilirubin 0.2 (L) 0.3 - 1.2 mg/dL   GFR calc non Af Amer >60 >60 mL/min   GFR calc Af Amer >60 >60 mL/min    Comment: (NOTE) The eGFR has been calculated using the CKD EPI equation. This calculation has not been validated in all clinical situations. eGFR's persistently <60 mL/min signify possible Chronic Kidney Disease.    Anion gap 11 5 - 15  Ethanol (ETOH)     Status: Abnormal  Collection Time: 10/06/15  1:59 PM  Result Value Ref Range   Alcohol, Ethyl (B) 231 (H) <5 mg/dL    Comment:        LOWEST DETECTABLE LIMIT FOR SERUM ALCOHOL IS 5 mg/dL FOR MEDICAL PURPOSES ONLY   CBC     Status: None   Collection Time: 10/06/15  1:59 PM  Result Value Ref Range   WBC 5.3 4.0 - 10.5 K/uL   RBC 4.75 3.87 - 5.11 MIL/uL   Hemoglobin 14.8 12.0 - 15.0 g/dL   HCT 46.0 36.0 - 46.0 %   MCV 96.8 78.0 - 100.0 fL   MCH 31.2 26.0 - 34.0 pg   MCHC 32.2 30.0 - 36.0 g/dL   RDW 12.6 11.5 - 15.5 %   Platelets 191 150 - 161 K/uL    Metabolic Disorder Labs:  No results found for: HGBA1C, MPG No results found for: PROLACTIN No results found for: CHOL, TRIG, HDL, CHOLHDL, VLDL, LDLCALC  Current Medications: Current Facility-Administered Medications  Medication Dose Route Frequency Provider Last Rate Last Dose  . alum & mag hydroxide-simeth (MAALOX/MYLANTA) 200-200-20 MG/5ML suspension 30 mL  30 mL Oral Q4H PRN Laverle Hobby, PA-C      . chlordiazePOXIDE (LIBRIUM) capsule 25 mg  25 mg Oral Q6H PRN Laverle Hobby, PA-C      . chlordiazePOXIDE (LIBRIUM) capsule 25 mg  25 mg Oral QID Laverle Hobby, PA-C       Followed by  . [START ON 10/08/2015] chlordiazePOXIDE (LIBRIUM) capsule 25 mg  25 mg Oral TID Laverle Hobby, PA-C       Followed by  . [START ON  10/09/2015] chlordiazePOXIDE (LIBRIUM) capsule 25 mg  25 mg Oral BH-qamhs Lidiya Reise E Conan Mcmanaway, PA-C       Followed by  . [START ON 10/11/2015] chlordiazePOXIDE (LIBRIUM) capsule 25 mg  25 mg Oral Daily Laverle Hobby, PA-C      . [START ON 10/07/2015] escitalopram (LEXAPRO) tablet 20 mg  20 mg Oral Daily Laverle Hobby, PA-C      . hydrOXYzine (ATARAX/VISTARIL) tablet 25 mg  25 mg Oral Q6H PRN Laverle Hobby, PA-C      . loperamide (IMODIUM) capsule 2-4 mg  2-4 mg Oral PRN Laverle Hobby, PA-C      . magnesium hydroxide (MILK OF MAGNESIA) suspension 30 mL  30 mL Oral Daily PRN Laverle Hobby, PA-C      . [START ON 10/07/2015] multivitamin with minerals tablet 1 tablet  1 tablet Oral Daily Laverle Hobby, PA-C      . ondansetron (ZOFRAN-ODT) disintegrating tablet 4 mg  4 mg Oral Q6H PRN Laverle Hobby, PA-C      . thiamine (B-1) injection 100 mg  100 mg Intramuscular Once Laverle Hobby, PA-C      . [START ON 10/07/2015] thiamine (VITAMIN B-1) tablet 100 mg  100 mg Oral Daily Laverle Hobby, PA-C      . [START ON 10/07/2015] traZODone (DESYREL) tablet 50 mg  50 mg Oral QHS,MR X 1 Donald Jacque E Danial Sisley, PA-C       PTA Medications: Prescriptions prior to admission  Medication Sig Dispense Refill Last Dose  . Cholecalciferol 1000 UNITS TBDP Take 1,000 Units by mouth daily.   10/06/2015 at Unknown time  . escitalopram (LEXAPRO) 20 MG tablet Take 20 mg by mouth daily.   10/06/2015 at Unknown time  . HYDROmorphone (DILAUDID) 2 MG tablet 1-3 tabs every 4 hours prn pain (  Patient not taking: Reported on 10/06/2015) 60 tablet 0 Not Taking at Unknown time    Musculoskeletal: Strength & Muscle Tone: within normal limits Gait & Station: normal Patient leans: N/A  Psychiatric Specialty Exam: Physical Exam  Nursing note and vitals reviewed. Constitutional: She is oriented to person, place, and time. She appears well-developed and well-nourished.  Respiratory: Effort normal and breath sounds normal.   Neurological: She is alert and oriented to person, place, and time. No cranial nerve deficit.  Skin: Skin is warm and dry.  Psychiatric: She has a normal mood and affect.    Review of Systems  Constitutional: Negative.   Eyes: Negative.   Cardiovascular:       Hypertension  Gastrointestinal:       GERD  Skin: Negative.   Neurological: Positive for headaches.  Psychiatric/Behavioral: Positive for depression, suicidal ideas and substance abuse. Negative for hallucinations. The patient is not nervous/anxious and does not have insomnia.     There were no vitals taken for this visit.There is no weight on file to calculate BMI.  General Appearance: Disheveled  Eye Contact::  Good  Speech:  Clear and Coherent  Volume:  Normal  Mood:  Depressed  Affect:  Congruent  Thought Process:  Circumstantial  Orientation:  Full (Time, Place, and Person)  Thought Content:  denies  Suicidal Thoughts:  Yes.  without intent/plan  Homicidal Thoughts:  No  Memory:  Immediate;   Fair  Judgement:  Impaired  Insight:  Lacking  Psychomotor Activity:  Normal  Concentration:  Fair  Recall:  [employer] of Knowledge:Fair  Language: Good  Akathisia:  Negative  Handed:  Right  AIMS (if indicated):     Assets:  Desire for Improvement  ADL's:  Intact  Cognition: WNL  Sleep:        Treatment Plan Summary: Plan Admitted to [facility] for crises intervention, safety and stabilization, TTS to aid with further disposition in am  Observation Level/Precautions:  Continuous Observation  Laboratory:    Psychotherapy:    Medications:    Consultations:    Discharge Concerns:    Estimated LOS: 24 - 48 hours  Other:     I certify that inpatient services furnished can reasonably be expected to improve the patient's condition.   Katelinn Justice E 11/2/201611:10 PM

## 2015-10-06 NOTE — ED Notes (Signed)
Patient was given a coke. 

## 2015-10-06 NOTE — BH Assessment (Signed)
Assessment Note  [name] is an 49 y.o. female with history of ADHD, Anxiety, Depression and Alcoholism. She was referred to [facility] after endorsing suicidal thoughts via text to her therapist [name] [phone]. Writer contacted [name] who explains she has provided therapy to patient for several years (CD-IOP, DVT, individual, etc.). [name] sts that patient has a diagnosis of depression, anxiety, and suffers from a long history of alcohol abuse.  Sts that although patient has struggled with abstinence from chronic alcohol abuse over the past 6 months she has done well. Patient has remained sober for 6 months which is also her longest period of sobriety.. Patient is dedicated to therapy and self help programs to continue her abstinence. [name] sts that patient has recently began missing appointments and this is unusual for her. Patient missed her appointment with [name] today. [name] called to check on patient but she didn't answer. Later on today  [name] received text from patient which stated, "I am done", "Goodbye", "I am finish living", "I am checking out", "Thanks for your help", "This is the end", and "I love you". Patient made these statements to end her life and this was triggered by a relapse of alcohol. Today patient admits to drinking 1/2 a bottle of Vodka. [name] has worked with patient through many relapses before but never has patient endorsed suicidal thoughts. [name] is also not aware of patient having a previous suicide attempt/gesture.   Today patient admits that she felt suicidal earlier. She sts, "I just had a bad day" She currently denies SI, HI, and AVH's. Patient denies history of suicidal attempts/gestures. She also denies history of self mutilating behaviors.She participated in rehabilitation programs in [place] (2x's) in the past.    Probation officer discussed clinicals with Mary Gunning, NP and inpatient treatment was recommended.   Diagnosis: Alcohol Abuse, ADHD, Anxiety, Depressive Disorder  NOS, Substance Induced Mood Disorder  Past Medical History:  Past Medical History  Diagnosis Date  . Hypertension   . ADHD (attention deficit hyperactivity disorder)   . Anxiety   . SUI (stress urinary incontinence, female)   . History of anemia   . GERD (gastroesophageal reflux disease)   . Cutaneous lupus erythematosus   . 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 Anemia     Past Surgical History  Procedure Laterality Date  . Cesarean section  12-23-2004     W/ LEFT TUBAL LIGATION AND RIGHT SALPINGECTOMY  . Cesarean section  1995  . Bladder suspension N/A 02/13/2013    Procedure: [facility] SLING;  Surgeon: Malka So, MD;  [facility];  Service: Urology;  Laterality: N/A;  . Tubal ligation    . Orif proximal humerus fracture Right 04/28/2014    DR CHANDLER  . Orif humerus fracture Right 04/28/2014    Procedure: OPEN REDUCTION INTERNAL FIXATION (ORIF) PROXIMAL HUMERUS FRACTURE;  Surgeon: Nita Sells, MD;  Location: [facility];  Service: Orthopedics;  Laterality: Right;    Family History: History reviewed. No pertinent family history.  Social History:  reports that she has been smoking Cigarettes.  She has a 10 pack-year smoking history. She has never used smokeless tobacco. She reports that she drinks alcohol. She reports that she does not use illicit drugs.  Additional Social History:  Alcohol / Drug Use Pain Medications: SEE MAR Prescriptions: SEE MAR Over the Counter: SEE MAR History of alcohol / drug use?: Yes Longest period of sobriety (when/how long): 6 months Negative Consequences of Use: Financial,  Personal relationships, Work / Automotive engineer #1 Name of Substance 1: Alcohol (relapsed today after 6 months of sobriety) 1 - Age of First Use: "30's" 1 - Amount (size/oz): "1/2 a bottle of vodka" 1 - Frequency: 1x in the past 6 months  1 - Duration: on-giong  1 - Last Use / Amount: today 10/06/2015  CIWA: CIWA-Ar BP: 100/75 mmHg Pulse Rate:  99 Nausea and Vomiting: no nausea and no vomiting Tactile Disturbances: none Tremor: no tremor Auditory Disturbances: not present Paroxysmal Sweats: no sweat visible Visual Disturbances: not present Anxiety: two Headache, Fullness in Head: none present Agitation: normal activity Orientation and Clouding of Sensorium: oriented and can do serial additions CIWA-Ar Total: 2 COWS:    Allergies:  Allergies  Allergen Reactions  . Vicodin [Hydrocodone-Acetaminophen] Nausea And Vomiting and Other (See Comments)    dizzy    Home Medications:  (Not in a hospital admission)  OB/GYN Status:  No LMP recorded. Patient is postmenopausal.  General Assessment Data Location of Assessment: [facility] Is this a Tele or Face-to-Face Assessment?: Face-to-Face Is this an Initial Assessment or a Re-assessment for this encounter?: Initial Assessment Marital status: Single Maiden name:  (n/a) Is patient pregnant?: No Pregnancy Status: No Can pt return to current living arrangement?: No Admission Status: Voluntary Is patient capable of signing voluntary admission?: Yes Referral Source: Self/Family/Friend Insurance type:  Environmental education officer and [place])     [place] Name of Psychiatrist:  (Dr. Silvio Kline at [facility] Name of Therapist:  Lattie Kline [place] [phone])  Education Status Is patient currently in school?: No Current Grade:  (n/a) Highest grade of school patient has completed:  (n/a) Name of school:  (n/a)  Risk to self with the past 6 months Suicidal Ideation: No-Not Currently/Within Last 6 Months (patient texted therapist earlier today suicidal statements) Has patient been a risk to self within the past 6 months prior to admission? : Yes Suicidal Intent: No-Not Currently/Within Last 6 Months Has patient had any suicidal intent within the past 6 months prior to admission? : No Is patient at risk for suicide?: Yes Suicidal Plan?: No (patient told therapist earlier today she had a  plan) Has patient had any suicidal plan within the past 6 months prior to admission? : No Access to Means: No What has been your use of drugs/alcohol within the last 12 months?:  (patient reports alcohol relapse today ) Previous Attempts/Gestures: No How many times?:  (0) Other Self Harm Risks:  (none reported) Triggers for Past Attempts: Other (Comment) (no prevous attempts ) Intentional Self Injurious Behavior: None Family Suicide History: Unknown Recent stressful life event(s): Other (Comment) (relapsed on alcohol today  ) Persecutory voices/beliefs?: No Depression: Yes Depression Symptoms: Feeling angry/irritable, Feeling worthless/self pity, Loss of interest in usual pleasures, Guilt, Isolating, Fatigue, Tearfulness, Insomnia, Despondent Substance abuse history and/or treatment for substance abuse?: No Suicide prevention information given to non-admitted patients: Not applicable  Risk to Others within the past 6 months Homicidal Ideation: No Does patient have any lifetime risk of violence toward others beyond the six months prior to admission? : No Thoughts of Harm to Others: No Current Homicidal Intent: No Current Homicidal Plan: No Access to Homicidal Means: No Identified Victim:  (n/a) History of harm to others?: No Assessment of Violence: None Noted Violent Behavior Description:  (patient is calm and cooperative ) Does patient have access to weapons?: No Criminal Charges Pending?: No Does patient have a court date: No Is patient on probation?: No  Psychosis Hallucinations:  None noted Delusions: None noted  Mental Status Report Appearance/Hygiene: In scrubs Eye Contact: Good Motor Activity: Agitation Speech: Logical/coherent Level of Consciousness: Alert Mood: Depressed, Anxious, Angry, Irritable Affect: Angry, Appropriate to circumstance, Depressed, Irritable Anxiety Level: Minimal Thought Processes: Relevant Judgement: Impaired Orientation: Time, Situation,  Person, Place Obsessive Compulsive Thoughts/Behaviors: None  Cognitive Functioning Concentration: Decreased Memory: Recent Intact, Remote Intact IQ: Average Insight: Fair Impulse Control: Poor Appetite: Fair Weight Loss:  (none reported) Weight Gain:  (n/a) Sleep: No Change Total Hours of Sleep:  (varies ) Vegetative Symptoms: None  ADLScreening [facility] Assessment Services) Patient's cognitive ability adequate to safely complete daily activities?: Yes Patient able to express need for assistance with ADLs?: No Independently performs ADLs?: Yes (appropriate for developmental age)  Prior Inpatient Therapy Prior Inpatient Therapy: Yes Prior Therapy Dates:  (Rehab in [place], [place] (2x's)) Prior Therapy Facilty/Provider(s):  Agricultural consultant ) Reason for Treatment:  (Substance Abuse)  Prior Outpatient Therapy Prior Outpatient Therapy: Yes Prior Therapy Dates:  (current) Prior Therapy Facilty/Provider(s):  ([facility]; psychiatrist- Dr. Silvio Kline & therapist) Reason for Treatment:  (medication managment and therapy) Does patient have an ACCT team?: No Does patient have Intensive In-House Services?  : No Does patient have [facility] services? : No Does patient have P4CC services?: No  ADL Screening (condition at time of admission) Patient's cognitive ability adequate to safely complete daily activities?: Yes Is the patient deaf or have difficulty hearing?: No Does the patient have difficulty seeing, even when wearing glasses/contacts?: No Does the patient have difficulty concentrating, remembering, or making decisions?: Yes Patient able to express need for assistance with ADLs?: No Does the patient have difficulty dressing or bathing?: No Independently performs ADLs?: Yes (appropriate for developmental age) Does the patient have difficulty walking or climbing stairs?: No Weakness of Legs: None Weakness of Arms/Hands: None  Home Assistive Devices/Equipment Home  Assistive Devices/Equipment: None    Abuse/Neglect Assessment (Assessment to be complete while patient is alone) Physical Abuse: Denies Verbal Abuse: Denies Sexual Abuse: Denies Exploitation of patient/patient's resources: Denies Self-Neglect: Denies Values / Beliefs Cultural Requests During Hospitalization: None Spiritual Requests During Hospitalization: None   Advance Directives (For Healthcare) Does patient have an advance directive?: No Would patient like information on creating an advanced directive?: No - patient declined information    Additional Information 1:1 In Past 12 Months?: No CIRT Risk: No Elopement Risk: No Does patient have medical clearance?: No     Disposition:  Disposition Initial Assessment Completed for this Encounter: Yes Disposition of Patient: Inpatient treatment program Type of inpatient treatment program: Adult (Per Mary Gunning, NP patient meets criteria for inpatient treatment)  On Site Evaluation by:   Reviewed with Physician:    Waldon Merl [facility] 10/06/2015 4:58 PM

## 2015-10-06 NOTE — ED Notes (Signed)
Patient was given a [place] sandwich bag with drink.

## 2015-10-06 NOTE — ED Notes (Signed)
[place] notified.

## 2015-10-06 NOTE — ED Notes (Signed)
TTS computer placed in room.

## 2015-10-06 NOTE — ED Notes (Signed)
Called behavioral health to verify transfer info: patient to be assigned to bed 7, and receiving provider is Dr. Daleen Squibb.

## 2015-10-06 NOTE — ED Notes (Signed)
Attempted report to receiving nurse, Katharine Look, at [phone].

## 2015-10-06 NOTE — ED Notes (Signed)
Second attempt to call report.

## 2015-10-06 NOTE — ED Provider Notes (Signed)
CSN: [id number]     Arrival date & time 10/06/15  1334 History   First MD Initiated Contact with Patient 10/06/15 1413     Chief Complaint  Patient presents with  . Alcohol Problem  . Depression     (Consider location/radiation/quality/duration/timing/severity/associated sxs/prior Treatment) HPI [name] is a 49 y.o. female with hx of HTN, ADHD, anxiety, anemia, CLE, presents emergency department accompanied her husband. Patient apparently relapsed after being 6 months sober, and sent several tacks to her therapist indicating that she wanted to hurt herself, that she had a plan. Her therapist called 911. Patient is here voluntarily. She states that she's no longer feeling suicidal, states that she did relapsed this morning because her life is out of control and she feels like she is depressed and not taking care of herself. She reports increased stressors. She admits to missing lexapro doses. Pt's husband at bedside, stating that she voices suicidal ideations each time she is intoxicated.   Past Medical History  Diagnosis Date  . Hypertension   . ADHD (attention deficit hyperactivity disorder)   . Anxiety   . SUI (stress urinary incontinence, female)   . History of anemia   . GERD (gastroesophageal reflux disease)   . Cutaneous lupus erythematosus   . Cancer ([place])     bladder  . Anemia    Past Surgical History  Procedure Laterality Date  . Cesarean section  12-23-2004     W/ LEFT TUBAL LIGATION AND RIGHT SALPINGECTOMY  . Cesarean section  1995  . Bladder suspension N/A 02/13/2013    Procedure: [facility]ING;  Surgeon: Malka So, MD;  Location: [facility];  Service: Urology;  Laterality: N/A;  . Tubal ligation    . Orif proximal humerus fracture Right 04/28/2014    DR CHANDLER  . Orif humerus fracture Right 04/28/2014    Procedure: OPEN REDUCTION INTERNAL FIXATION (ORIF) PROXIMAL HUMERUS FRACTURE;  Surgeon: Nita Sells, MD;  Location: [place];   Service: Orthopedics;  Laterality: Right;   History reviewed. No pertinent family history. Social History  Substance Use Topics  . Smoking status: Current Every Day Smoker -- 0.50 packs/day for 20 years    Types: Cigarettes  . Smokeless tobacco: Never Used  . 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    OB History    No data available     Review of Systems  Constitutional: Negative for fever and chills.  Respiratory: Negative for cough, chest tightness and shortness of breath.   Cardiovascular: Negative for chest pain, palpitations and leg swelling.  Gastrointestinal: Negative for nausea, vomiting, abdominal pain and diarrhea.  Musculoskeletal: Negative for myalgias, arthralgias, neck pain and neck stiffness.  Skin: Negative for rash.  Neurological: Negative for dizziness, weakness and headaches.  Psychiatric/Behavioral: Positive for agitation.  All other systems reviewed and are negative.     Allergies  Vicodin  Home Medications   Prior to Admission medications   Medication Sig Start Date End Date Taking? Authorizing Provider  Aspirin-Salicylamide-Caffeine (BC HEADACHE PO) Take 1 packet by mouth every 6 (six) hours as needed (for headache or pain). For pain    Historical Provider, MD  Cholecalciferol 1000 UNITS TBDP Take 1,000 Units by mouth daily.    Historical Provider, MD  clonazePAM (KLONOPIN) 1 MG tablet Take 1 mg by mouth 2 (two) times daily as needed for anxiety.    Historical Provider, MD  escitalopram (LEXAPRO) 20 MG tablet  Take 20 mg by mouth daily.    Historical Provider, MD  HYDROmorphone (DILAUDID) 2 MG tablet 1-3 tabs every 4 hours prn pain 04/29/14   Grier Mitts, PA-C  progesterone (PROMETRIUM) 200 MG capsule Take 200 mg by mouth daily.    Historical Provider, MD  traZODone (DESYREL) 100 MG tablet Take 100 mg by mouth at bedtime.    Historical Provider, MD   BP 100/75 mmHg  Pulse 99  Temp(Src) 98.2 F (36.8 C) (Oral)  Resp 18   SpO2 99% Physical Exam  Constitutional: She appears well-developed and well-nourished. No distress.  HENT:  Head: Normocephalic.  Eyes: Conjunctivae are normal.  Neck: Neck supple.  Cardiovascular: Normal rate, regular rhythm and normal heart sounds.   Pulmonary/Chest: Effort normal and breath sounds normal. No respiratory distress. She has no wheezes. She has no rales.  Abdominal: Soft. Bowel sounds are normal. She exhibits no distension. There is no tenderness. There is no rebound.  Musculoskeletal: She exhibits no edema.  Neurological: She is alert.  Skin: Skin is warm and dry.  Psychiatric: She has a normal mood and affect. Her behavior is normal.  Nursing note and vitals reviewed.   ED Course  Procedures (including critical care time) Labs Review Labs Reviewed  COMPREHENSIVE METABOLIC PANEL - Abnormal; Notable for the following:    Sodium 146 (*)    Glucose, Bld 114 (*)    Total Protein 6.4 (*)    Total Bilirubin 0.2 (*)    All other components within normal limits  ETHANOL - Abnormal; Notable for the following:    Alcohol, Ethyl (B) 231 (*)    All other components within normal limits  CBC  URINE RAPID DRUG SCREEN, HOSP PERFORMED    Imaging Review No results found. I have personally reviewed and evaluated these images and lab results as part of my medical decision-making.   EKG Interpretation None      MDM   Final diagnoses:  Suicidal ideations  Alcohol abuse     2:55 PM I spoke with Lattie Haw, patient's therapist. Patient apparently has never expressed SI to her in the past and she has seen her for several years. She said her text messages that said "I am done. I am going to in my life. I want to thank you for everything you have done." She also expressed her plan. Her Lattie Haw, patient needs to be cleared by psychiatrist based on their protocol or contract for safety. Will get to his assessment.  4:14 PM Patient assessed by TTS. It was recommended that she would  be admitted inpatient for her threatened suicidal ideations earlier this morning. Patient does not want to stay, she is screaming in the room, I offered her some Ativan, she refused. Patient very agitated. I find I will have to involuntary commit her, however she finally said that she will stay here and does not want to be involuntarily committed. I have been in the room multiple times by their request and requested a husband to explain to them how why we are keeping her. I explained to her this is recommended by the TTS team.   Filed Vitals:   10/06/15 1902 10/06/15 2010 10/06/15 2011 10/06/15 2225  BP: 118/66 109/62 109/62 116/68  Pulse: 86 89 89 74  Temp: 98.3 F (36.8 C) 97.7 F (36.5 C)  98.4 F (36.9 C)  TempSrc: Oral Oral  Oral  Resp: 16 18  18   SpO2: 98% 96%  94%     Jeannett Senior,  PA-C 10/08/15 0020  Dorie Rank, MD 10/08/15 [phone]

## 2015-10-06 NOTE — ED Notes (Signed)
pts clothes  In[employer]oried  Jewelry  2 earrings clear stone square stones.  Band and abnd with large and small stones and cell phone placed in the safe in security office

## 2015-10-06 NOTE — ED Notes (Signed)
pts alert she denies suididal or homicidal ideation no tremors no pain anywhere no diaphoresis.  Meal tray ordered

## 2015-10-06 NOTE — ED Notes (Signed)
Pt here for eval sent by psychiatrist. Pt intoxicated. sts she is depressed but denies SI.

## 2015-10-06 NOTE — Progress Notes (Signed)
Client is a 49 yo female admitted after relapsing on alcohol. Client reports she had been sober for 6 months. Client is teary during this admission reports "I messed up" Client reports stressors as work, having to file bankruptcy, and getting irritated with husband. 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 Client currently denies SHI, although she had told ER staff she was having SI. Reviewed admission paperwork with client, client oriented to OBS unit, client remains safe on the unit.

## 2015-10-06 NOTE — ED Notes (Signed)
The pt just rang her bell  She wants caffeine fir a  headaxche

## 2015-10-06 NOTE — Progress Notes (Signed)
Patient accepted to [facility] Observation unit bed 2. Clayborne Dana, RN

## 2015-10-06 NOTE — ED Notes (Signed)
[facility] called to report that pt's are being held in observation for placement.  [name] would likely get a bed tomorrow.

## 2015-10-07 ENCOUNTER — Encounter (HOSPITAL_COMMUNITY): Payer: Self-pay | Admitting: *Deleted

## 2015-10-07 DIAGNOSIS — F1014 Alcohol abuse with alcohol-induced mood disorder: Secondary | ICD-10-CM | POA: Diagnosis present

## 2015-10-07 DIAGNOSIS — Y908 Blood alcohol level of 240 mg/100 ml or more: Secondary | ICD-10-CM | POA: Diagnosis present

## 2015-10-07 DIAGNOSIS — F329 Major depressive disorder, single episode, unspecified: Secondary | ICD-10-CM | POA: Diagnosis present

## 2015-10-07 DIAGNOSIS — F17210 Nicotine dependence, cigarettes, uncomplicated: Secondary | ICD-10-CM | POA: Diagnosis present

## 2015-10-07 DIAGNOSIS — R45851 Suicidal ideations: Secondary | ICD-10-CM | POA: Diagnosis present

## 2015-10-07 DIAGNOSIS — F1020 Alcohol dependence, uncomplicated: Secondary | ICD-10-CM | POA: Diagnosis present

## 2015-10-07 MED ORDER — VITAMIN B-1 100 MG PO TABS
100.0000 mg | ORAL_TABLET | Freq: Every day | ORAL | Status: DC
  Administered 2015-10-08 – 2015-10-09 (×2): 100 mg via ORAL
  Filled 2015-10-07 (×4): qty 1

## 2015-10-07 MED ORDER — ESCITALOPRAM OXALATE 20 MG PO TABS
20.0000 mg | ORAL_TABLET | Freq: Every day | ORAL | Status: DC
  Filled 2015-10-07 (×2): qty 1

## 2015-10-07 MED ORDER — CHLORDIAZEPOXIDE HCL 25 MG PO CAPS
25.0000 mg | ORAL_CAPSULE | Freq: Three times a day (TID) | ORAL | Status: DC
  Filled 2015-10-07: qty 1

## 2015-10-07 MED ORDER — CHLORDIAZEPOXIDE HCL 25 MG PO CAPS
25.0000 mg | ORAL_CAPSULE | Freq: Four times a day (QID) | ORAL | Status: AC
  Filled 2015-10-07: qty 1

## 2015-10-07 MED ORDER — ESCITALOPRAM OXALATE 20 MG PO TABS
20.0000 mg | ORAL_TABLET | Freq: Every day | ORAL | Status: DC
  Administered 2015-10-08 – 2015-10-09 (×2): 20 mg via ORAL
  Filled 2015-10-07 (×4): qty 1

## 2015-10-07 MED ORDER — CHLORDIAZEPOXIDE HCL 25 MG PO CAPS: 25.0000 mg | ORAL_CAPSULE | ORAL | Status: DC

## 2015-10-07 MED ORDER — ADULT MULTIVITAMIN W/MINERALS CH
1.0000 | ORAL_TABLET | Freq: Every day | ORAL | Status: DC
  Administered 2015-10-08 – 2015-10-09 (×2): 1 via ORAL
  Filled 2015-10-07 (×4): qty 1

## 2015-10-07 MED ORDER — ALUM & MAG HYDROXIDE-SIMETH 200-200-20 MG/5ML PO SUSP: 30.0000 mL | ORAL | Status: DC | PRN

## 2015-10-07 MED ORDER — TRAZODONE HCL 50 MG PO TABS
50.0000 mg | ORAL_TABLET | Freq: Every evening | ORAL | Status: DC | PRN
  Filled 2015-10-07 (×4): qty 1

## 2015-10-07 MED ORDER — IBUPROFEN 600 MG PO TABS
600.0000 mg | ORAL_TABLET | Freq: Four times a day (QID) | ORAL | Status: DC | PRN
  Filled 2015-10-07: qty 1

## 2015-10-07 MED ORDER — MAGNESIUM HYDROXIDE 400 MG/5ML PO SUSP: 30.0000 mL | Freq: Every day | ORAL | Status: DC | PRN

## 2015-10-07 MED ORDER — LOPERAMIDE HCL 2 MG PO CAPS: 2.0000 mg | ORAL_CAPSULE | ORAL | Status: DC | PRN

## 2015-10-07 MED ORDER — TRAZODONE HCL 50 MG PO TABS
50.0000 mg | ORAL_TABLET | Freq: Every evening | ORAL | Status: DC | PRN
Start: 2015-10-07 — End: 2015-10-09
  Administered 2015-10-07: 50 mg via ORAL
  Filled 2015-10-07 (×8): qty 1

## 2015-10-07 MED ORDER — HYDROXYZINE HCL 25 MG PO TABS: 25.0000 mg | ORAL_TABLET | Freq: Four times a day (QID) | ORAL | Status: DC | PRN

## 2015-10-07 MED ORDER — CHLORDIAZEPOXIDE HCL 25 MG PO CAPS: 25.0000 mg | ORAL_CAPSULE | Freq: Four times a day (QID) | ORAL | Status: DC | PRN

## 2015-10-07 MED ORDER — CHLORDIAZEPOXIDE HCL 25 MG PO CAPS: 25.0000 mg | ORAL_CAPSULE | Freq: Every day | ORAL | Status: DC

## 2015-10-07 MED ORDER — ONDANSETRON 4 MG PO TBDP
4.0000 mg | ORAL_TABLET | Freq: Four times a day (QID) | ORAL | Status: DC | PRN
Start: 2015-10-07 — End: 2015-10-09

## 2015-10-07 NOTE — Tx Team (Signed)
Initial Interdisciplinary Treatment Plan   PATIENT STRESSORS: Financial difficulties Marital or family conflict Medication change or noncompliance Substance abuse   PATIENT STRENGTHS: Ability for insight Average or above average intelligence Capable of independent living Communication skills General fund of knowledge Motivation for treatment/growth Physical Health Supportive family/friends Work skills   PROBLEM LIST: Problem List/Patient Goals Date to be addressed Date deferred Reason deferred Estimated date of resolution  "I just don't want to relapse anymore." 10/07/15           "I can't think of another goal. I need to get back to work." 10/07/15                                          DISCHARGE CRITERIA:  Improved stabilization in mood, thinking, and/or behavior Need for constant or close observation no longer present Reduction of life-threatening or endangering symptoms to within safe limits Verbal commitment to aftercare and medication compliance  PRELIMINARY DISCHARGE PLAN: Attend 12-step recovery group Outpatient therapy Return to previous living arrangement Return to previous work or school arrangements  PATIENT/FAMIILY INVOLVEMENT: This treatment plan has been presented to and reviewed with the patient, [name], and/or family member.  The patient and family have been given the opportunity to ask questions and make suggestions.  [name] [facility] 10/07/2015, 12:54 PM

## 2015-10-07 NOTE — Discharge Summary (Signed)
OBS Unit Discharge Summary Note  Patient:  [name] is an 49 y.o., female MRN:  [id number] DOB:  1966-02-24 Patient phone:  [phone] (home)  Patient address:   [place] [address],  Total Time spent with patient: 30 minutes  Date of Admission:  10/06/2015 Date of Discharge: 10/07/2015  Reason for Admission:  Depression  Principal Problem: MDD (major depressive disorder) [facility]) Discharge Diagnoses: Patient Active Problem List   Diagnosis Date Noted  . MDD (major depressive disorder) ([place]) [F32.9] 10/07/2015    Priority: High  . Alcohol abuse with alcohol-induced mood disorder ([place]) [F10.14] 10/06/2015  . 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 GAD (generalized anxiety disorder) [F41.1]   . 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   ROS  Blood pressure 124/84, pulse 90, temperature 98.4 F (36.9 C), temperature source Oral, resp. rate 18, height _0  (1.651 m), weight 69.4 kg (153 lb).Body mass index is 25.46 kg/(m^2).   General Appearance: Disheveled  Eye Contact:: Good  Speech: Clear and Coherent  Volume: Normal  Mood: Depressed  Affect: Congruent  Thought Process: Circumstantial  Orientation: Full (Time, Place, and Person)  Thought Content: denies  Suicidal Thoughts: Yes. without intent/plan  Homicidal Thoughts: No  Memory: Immediate; Fair  Judgement: Impaired  Insight: Lacking  Psychomotor Activity: Normal  Concentration: Fair  Recall: [employer] of Knowledge:Fair  Language: Good  Akathisia: Negative  Handed: Right  AIMS (if indicated):    Assets: Desire for Improvement  ADL's: Intact  Cognition: WNL  Sleep:         Have you used  any form of tobacco in the last 30 days? (Cigarettes, Smokeless Tobacco, Cigars, and/or Pipes): Yes  Has this patient used any form of tobacco in the last 30 days? (Cigarettes, Smokeless Tobacco, Cigars, and/or Pipes) N/A  Past Medical History:  Past Medical History  Diagnosis Date  . Hypertension   . ADHD (attention deficit hyperactivity disorder)   . Anxiety   . SUI (stress urinary incontinence, female)   . History of anemia   . GERD (gastroesophageal reflux disease)   . Cutaneous lupus erythematosus   . 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 Anemia     Past Surgical History  Procedure Laterality Date  . Cesarean section  12-23-2004     W/ LEFT TUBAL LIGATION AND RIGHT SALPINGECTOMY  . Cesarean section  1995  . Bladder suspension N/A 02/13/2013    Procedure: [facility];  Surgeon: Malka So, MD;  Location: [facility];  Service: Urology;  Laterality: N/A;  . Tubal ligation    . Orif proximal humerus fracture Right 04/28/2014    DR CHANDLER  . Orif humerus fracture Right 04/28/2014    Procedure: OPEN REDUCTION INTERNAL FIXATION (ORIF) PROXIMAL HUMERUS FRACTURE;  Surgeon: Nita Sells, MD;  Location: [place];  Service: Orthopedics;  Laterality: Right;   Family History: History reviewed. No pertinent family history. Social History:  History  Alcohol Use  . Yes    Comment: was in recovery for 4 months.      History  Drug Use No    Social History   Social History  . Marital Status: Married    Spouse Name: N/A  . Number of Children: N/A  . Years  of Education: N/A   Social History Main Topics  . Smoking status: Current Every Day Smoker -- 0.50 packs/day for 20 years    Types: Cigarettes  . Smokeless tobacco: Never Used  . 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   . Drug Use: No  . Sexual Activity: Yes   Other Topics Concern  . None   Social History Narrative   Risk to Self: Is patient at risk for suicide?: Yes Risk to Others:    Prior Inpatient Therapy:   Prior Outpatient Therapy:    Level of Care:  inpatient  Hospital Course:  Patient admitted initially to OBS unit.  Will need further evaluation and treatment inpatient unit.   Consults:  psychiatry  Significant Diagnostic Studies:  labs: per ED  Discharge Vitals:   Blood pressure 124/84, pulse 90, temperature 98.4 F (36.9 C), temperature source Oral, resp. rate 18, height _0  (1.651 m), weight 69.4 kg (153 lb). Body mass index is 25.46 kg/(m^2). Lab Results:   Results for orders placed or performed during the hospital encounter of 10/06/15 (from the past 72 hour(s))  Comprehensive metabolic panel     Status: Abnormal   Collection Time: 10/06/15  1:59 PM  Result Value Ref Range   Sodium 146 (H) 135 - 145 mmol/L   Potassium 4.4 3.5 - 5.1 mmol/L   Chloride 109 101 - 111 mmol/L   CO2 26 22 - 32 mmol/L   Glucose, Bld 114 (H) 65 - 99 mg/dL   BUN 13 6 - 20 mg/dL   Creatinine, Ser 0.72 0.44 - 1.00 mg/dL   Calcium 8.9 8.9 - 10.3 mg/dL   Total Protein 6.4 (L) 6.5 - 8.1 g/dL   Albumin 4.0 3.5 - 5.0 g/dL   AST 28 15 - 41 U/L   ALT 31 14 - 54 U/L   Alkaline Phosphatase 98 38 - 126 U/L   Total Bilirubin 0.2 (L) 0.3 - 1.2 mg/dL   GFR calc non Af Amer >60 >60 mL/min   GFR calc Af Amer >60 >60 mL/min    Comment: (NOTE) The eGFR has been calculated using the CKD EPI equation. This calculation has not been validated in all clinical situations. eGFR's persistently <60 mL/min signify possible Chronic Kidney Disease.    Anion gap 11 5 - 15  Ethanol (ETOH)     Status: Abnormal   Collection Time: 10/06/15  1:59 PM  Result Value Ref Range   Alcohol, Ethyl (B) 231 (H) <5 mg/dL    Comment:        LOWEST DETECTABLE LIMIT FOR SERUM ALCOHOL IS 5 mg/dL FOR MEDICAL PURPOSES ONLY   CBC     Status: None   Collection Time: 10/06/15  1:59 PM  Result Value Ref Range   WBC 5.3 4.0 - 10.5 K/uL   RBC 4.75 3.87 - 5.11 MIL/uL   Hemoglobin 14.8 12.0 - 15.0 g/dL   HCT  46.0 36.0 - 46.0 %   MCV 96.8 78.0 - 100.0 fL   MCH 31.2 26.0 - 34.0 pg   MCHC 32.2 30.0 - 36.0 g/dL   RDW 12.6 11.5 - 15.5 %   Platelets 191 150 - 400 K/uL    Physical Findings: AIMS: Facial and Oral Movements Muscles of Facial Expression: None, normal Lips and Perioral Area: None, normal Jaw: None, normal Tongue: None, normal,Extremity Movements Upper (arms, wrists, hands, fingers): None, normal Lower (legs, knees, ankles, toes): None, normal, Trunk Movements Neck,  shoulders, hips: None, normal, Overall Severity Severity of abnormal movements (highest score from questions above): None, normal Incapacitation due to abnormal movements: None, normal Patient's awareness of abnormal movements (rate only patient's report): No Awareness, Dental Status Current problems with teeth and/or dentures?: No Does patient usually wear dentures?: No  CIWA:  CIWA-Ar Total: 4 COWS:      See Psychiatric Specialty Exam and Suicide Risk Assessment completed by Attending Physician prior to discharge.  Discharge destination:  Inpatient unit  Is patient on multiple antipsychotic therapies at discharge:  No   Has Patient had three or more failed trials of antipsychotic monotherapy by history:  No  Recommended Plan for Multiple Antipsychotic Therapies: NA     Medication List    ASK your doctor about these medications      Indication   Cholecalciferol 1000 UNITS Tbdp  Take 1,000 Units by mouth daily.      escitalopram 20 MG tablet  Commonly known as:  LEXAPRO  Take 20 mg by mouth daily.      HYDROmorphone 2 MG tablet  Commonly known as:  DILAUDID  1-3 tabs every 4 hours prn pain          Follow-up recommendations:  Activity:  as tol Diet:  as tol  Comments:  Inpatient unit  Total Discharge Time: 30 min  Signed: Freda Munro May Assunta Pupo AGNP-BC 10/07/2015, 3:41 PM

## 2015-10-07 NOTE — Progress Notes (Signed)
Writer introduced self to pt. Pt became tearful on approach. Pt reported that she haven't been compliant with her Lexapro or consistent with attending AA. Pt reported that she recently relapsed for a day, drinking wine. Pt denies suicidal thoughts at this time. Pt verbally contracts not to harm self. Writer oriented pt to the unit. Writer discussed Librium protocol with pt and pt is refusing to take Librium.

## 2015-10-07 NOTE — Progress Notes (Signed)
Patient admitted vol from obs unit. She had presented with SI in the ED after relapsing on "a few liquor drinks" after a stressful weekend. Patient has been sober x 6 months, has an OP therapist, a sponsor and is heavily involved with AA. She is tearful and remorseful at her relapse stating, "I just had so much going on. Just family stuff. I know what I need to do to prevent a relapse. I just wish I had. I don't know that I need to be here. I feel safe now. I don't want to hurt myself." Patient tearful and depressed. Admits she has not taken her lexapro as prescribed. 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 Patient search and skin assessment completed. No areas of concern identified. Meal provided, oriented to unit. Patient provided 72 hour RFD per her request which was signed at 1230. Patient verbalizes understanding of POC and remains safe. States she does not need librium as she is not experiencing any withdrawal symptoms. No HI/AVH. Report given to Reeves Forth, RN. Jamie Kato

## 2015-10-07 NOTE — BHH Counselor (Signed)
Adult Comprehensive Assessment  Patient ID: [name], female   DOB: 1966/08/16, 49 y.o.   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, nutritional / Lack of housing: afraid she may lose job Physical health (include injuries & life threatening diseases): no issues Social relationships: less time for self and self care Substance abuse: recent relapse on alcohol after 6 months sobriety, active in [place] and has had residential tx Bereavement / Loss: no issues  Living/Environment/Situation:  Living Arrangements: Spouse/significant other Living conditions (as described by patient or guardian): comfortable, supportive home How long has patient lived in current situation?: many years What is atmosphere in current home: Comfortable, Quarry manager, Supportive  Family History:  Marital status: Married Number of Years Married: 82 What types of issues is patient dealing with in the relationship?: relationship has improved since pt entered treatment and addressed substance use issues, husband now supportive Does patient have children?: Yes How many children?: 2 How is patient's relationship with their children?: good w daughter (30) and son (103)  Childhood History:  By whom was/is the patient raised?: Both parents Additional childhood history information: adopted as infant Description of patient's relationship with caregiver when they were a child: good childhood, no issues Patient's description of current relationship with people who raised him/her: visits frequently, supportive Does patient have siblings?: Yes Number of Siblings: 2 Description of patient's current relationship with siblings: good  relationship w 2 brothers Did patient suffer any verbal/emotional/physical/sexual abuse as a child?: No Did patient suffer from severe childhood neglect?: No Has patient ever been sexually abused/assaulted/raped as an adolescent or adult?: No Was the patient ever a victim of a crime or a disaster?: No Witnessed domestic violence?: No Has patient been effected by domestic violence as an adult?: No  Education:  Highest grade of school patient has completed: nursing school Currently a student?: No Learning disability?: No  Employment/Work Situation:   Employment situation: Employed 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, recovering from recent cardiac illness How long has patient been employed?: several months 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, states she was anxious to leave work and get home to drink, since then has completed various tx programs and AA, 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 What is the longest time patient has a held a job?: years Where was the patient employed at that time?: [place] patient ever been in the [employer]?: No Has patient ever served in combat?: No  Financial Resources:   Financial resources: Income from employment, Income from spouse Does patient have a representative payee or guardian?: No  Alcohol/Substance Abuse:   What has been your use of drugs/alcohol within the last 12 months?: 11/2 was stressed, wants to "think" and achieve calm mental state, had not been consistent in [place] meetings for several days, bought bottle of vodka and consumed whole bottle leading to blackout If attempted suicide, did drugs/alcohol play a role in this?: Yes (feels suicidal when drinking, shame/guilt re  relapse) Alcohol/Substance Abuse Treatment Hx: Attends AA/NA,  Past Tx, Inpatient If yes, describe treatment: 2 admissions for rehab at [facility], active in [place] w sponsor, lived in [place] immediately after discharge from [facility] Has alcohol/substance abuse ever caused legal problems?: No  Social Support System:   Patient's Community Support System: Good Describe Community Support System: many AA friends, wants increased social interaction outside of meetings, conflicted about spending time w daughter/family and self care Type of faith/religion: Darrick Meigs How does patient's faith help to cope with current illness?: feels "God has put me in the right place', prayer  Leisure/Recreation:   Leisure and Hobbies: [employer], shopping, reading, crafts projects, adult coloring books  Strengths/Needs:   What things does the patient do well?: caregiver, compassion In what areas does patient struggle / problems for patient: coping w stress, loneliness, "spending too much time by myself."  Discharge Plan:   Does patient have access to transportation?: Yes Will patient be returning to same living situation after discharge?: Yes Currently receiving community mental health services: Yes (From Whom) Sharmon Revere, therapist and Dr Silvio Pate, meds mgmt at [facility]) If no, would patient like referral for services when discharged?: No Does patient have financial barriers related to discharge medications?: No  Summary/Recommendations:    Patient is a 49 year old female admitted after relapse (drinking a bottle of vodka) in attempt to calm self.  Patient had been sober for approx 6 months post discharge from [facility] in Aug 2015.  Had one relapse in Jan 2016, then relapse on 10/06/15.  Patient remembers being stressed about lack of personal time and being "tired and worn out", purchased bottle of vodka and began to drink.  At some point, contacted therapist Sharmon Revere who called husband  and sheriff to home - patient reports she does not remember much about the incident and believes she had a blackout.  Reports significant struggle w anxiety and depression, had panic attack at former job and was taken to ED as result.  Is currently prescribed clonidine and Lexapro by Dr Silvio Pate at [facility], states she may have trouble remembering to take medications regularly and "notices the difference" when off the Lexapro.  Has been treated at [facility] twice - after last admission was discharged to [facility] and is now living at home w supportive husband and 60 year old daughter.  Attends AA regularly, has sponsor - says that she has allowed work and family care issues to get in the way of recovery work, wants to "work my program harder" in order to maintain sobriety, feels shame about relapse.  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  Pt anxious to discharge, says that family needs her income as they are facing bankruptcy and loss of home due to expenses related to her alcohol use disorder.  Plans to resume DBT and individual therapy w Sharmon Revere and return to Dr Silvio Pate for medications management. Smokes but does not want to quit at present, signed discharge process involvement form.  Patient will benefit from hospitalization to receive psychoeducation and group therapy services to increase coping skills for and understanding of anxiety and depression as well as relapse prevention, milieu therapy, medications management, and nursing support.  Patient will develop appropriate coping skills  for dealing w overwhelming emotions, stabilize on medications, and develop greater insight into and acceptance of his current illness.  CSWs will develop discharge plan to include  family support and referral to appropriate after care services, will return to current providers at discharge.  Edwyna Shell, LCSW Lead Clinical Social Worker Phone:  [phone]      Beverely Pace 10/07/2015

## 2015-10-07 NOTE — Progress Notes (Signed)
D: Pt transferred to [name] hall for inpatient care as per MD's orders. Pt denied SI, HI, AVH and pain when assessed.  A: All scheduled medications administered as prescribed. Verbal report given to receiving nurse Wynelle Fanny, RN. Transfer process reviewed with pt. Support, availability and encouragement provided to pt throughout stay in Observation Unit. Q 15 minutes checks maintained for safety without gestures of self injurious behavior to note at this time.  R: Pt presents with flat affect and depressed mood. Cooperative with care. Verbalized understanding and is in agreement related to transfer to inpatient unit. Denies adverse drug reactions. Remains safe in unit till time of transfer.

## 2015-10-08 ENCOUNTER — Encounter (HOSPITAL_COMMUNITY): Payer: Self-pay | Admitting: Psychiatry

## 2015-10-08 MED ORDER — NICOTINE POLACRILEX 2 MG MT GUM: 2.0000 mg | CHEWING_GUM | OROMUCOSAL | Status: DC | PRN

## 2015-10-08 NOTE — Progress Notes (Signed)
Adult Psychoeducational Group Note  Date:  10/08/2015 Time:  10:36 PM  Group Topic/Focus:  Wrap-Up Group:   The focus of this group is to help patients review their daily goal of treatment and discuss progress on daily workbooks.  Participation Level:  Active  Participation Quality:  Appropriate  Affect:  Appropriate  Cognitive:  Appropriate  Insight: Good  Engagement in Group:  Engaged  Modes of Intervention:  Support  Additional Comments:  Patient rated her day a 10. Said that she worked on handout and wants to work through her relapse and get better.  Donato Heinz 10/08/2015, 10:36 PM

## 2015-10-08 NOTE — Progress Notes (Signed)
  [facility] Adult Case Management Discharge Plan :  Will you be returning to the same living situation after discharge:  Yes,  Pt returning home At discharge, do you have transportation home?: Yes,  Pt husband to provide transportation Do you have the ability to pay for your medications: Yes,  Pt provided with prescriptions  Release of information consent forms completed and in the chart;  Patient's signature needed at discharge.  Patient to Follow up at: Follow-up Information    Follow up with [facility] On 10/13/2015.   Why:  Patient current w Dr Silvio Pate for medications management, next appt is 10/13/15 at 2 PM per patient.   Contact information:   [address],  [place], [place] [address] Phone: [phone]      Follow up with [facility] On 10/13/2015.   Why:  at 3:00pm with Sharmon Revere for therapy.   Contact information:   [place], [place] [address] [phone]      Next level of care provider has access to [facility] Link:no  Patient denies SI/HI: Yes,  Pt denies    Safety Planning and Suicide Prevention discussed: Yes,  with husband; see SPE note for further details  Have you used any form of tobacco in the last 30 days? (Cigarettes, Smokeless Tobacco, Cigars, and/or Pipes): Yes  Has patient been referred to the Quitline?: Patient refused referral  Bo Mcclintock 10/08/2015, 6:01 PM

## 2015-10-08 NOTE — BHH Suicide Risk Assessment (Signed)
[place] INPATIENT:  Family/Significant Other Suicide Prevention Education  Suicide Prevention Education:  Education Completed; Nyla Creason, Pt's husband [phone], has been identified by the patient as the family member/significant other with whom the patient will be residing, and identified as the person(s) who will aid the patient in the event of a mental health crisis (suicidal ideations/suicide attempt).  With written consent from the patient, the family member/significant other has been provided the following suicide prevention education, prior to the and/or following the discharge of the patient.  The suicide prevention education provided includes the following:  Suicide risk factors  Suicide prevention and interventions  National Suicide Hotline telephone number  [facility] assessment telephone number  [facility] Emergency Assistance [place] and/or Residential Mobile Crisis Unit telephone number  Request made of family/significant other to:  Remove weapons (e.g., guns, rifles, knives), all items previously/currently identified as safety concern.    Remove drugs/medications (over-the-counter, prescriptions, illicit drugs), all items previously/currently identified as a safety concern.  The family member/significant other verbalizes understanding of the suicide prevention education information provided.  The family member/significant other agrees to remove the items of safety concern listed above.  Bo Mcclintock 10/08/2015, 4:19 PM

## 2015-10-08 NOTE — Progress Notes (Signed)
D: Pt presents sad in affect and mood. Pt is inquiring about discharging in order to attend her [place] meetings and return to work. " I hadn't been to [place] since last Friday". Pt made aware that the unit offers AA meetings. Per pt, she prefers to attend her specific AA meetings only. Pt is denying any withdrawal symptoms. Pt also denied any suicidal ideation. Pt was in bed asleep for majority of the shift. Pt verbalized the lack of sleep for the past of couple of days. Pt refused her librium. Pt was pleasant in interaction. A: Writer administered trazodone to aid with sleep. Continued support and availability as needed was extended to this pt. Staff continue to monitor pt with q23min checks.  R: No adverse drug reactions noted. Pt receptive to treatment. Pt remains safe at this time.

## 2015-10-08 NOTE — BHH Group Notes (Signed)
[place] LCSW Group Therapy 10/08/2015 1:15pm  Type of Therapy: Group Therapy- Feelings Around Relapse and Recovery  Participation Level: Active   Participation Quality:  Appropriate  Affect:  Appropriate  Cognitive: Alert and Oriented   Insight:  Developing   Engagement in Therapy: Developing/Improving and Engaged   Modes of Intervention: Clarification, Confrontation, Discussion, Education, Exploration, Limit-setting, Orientation, Problem-solving, Rapport Building, Art therapist, Socialization and Support  Summary of Progress/Problems: The topic for today was feelings about relapse. The group discussed what relapse prevention is to them and identified triggers that they are on the path to relapse. Members also processed their feeling towards relapse and were able to relate to common experiences. Group also discussed coping skills that can be used for relapse prevention.  Pt was able to identify emotions related to her recent relapse on alcohol. PT was able to identify warning signs, triggers, and a plan to avoid these at discharge.   Therapeutic Modalities:   Cognitive Behavioral Therapy Solution-Focused Therapy Assertiveness Training Relapse Prevention Therapy    Norman Clay [phone] 10/08/2015 5:56 PM

## 2015-10-08 NOTE — BHH Suicide Risk Assessment (Signed)
[facility] Admission Suicide Risk Assessment   Nursing information obtained from:   chart, patient Demographic factors:   49 year old married female, lives with husband, child. Employed  Current Mental Status:   see below  Loss Factors:   recent relapse on alcohol, financial difficulties, working long hours  Historical Factors:   history of alcohol dependence Risk Reduction Factors:   resilience, sense of responsibility for family Total Time spent with patient: 45 minutes Principal Problem: MDD (major depressive disorder) ([place]) Diagnosis:   Patient Active Problem List   Diagnosis Date Noted  . MDD (major depressive disorder) ([place]) [F32.9] 10/07/2015  . Alcohol abuse with alcohol-induced mood disorder ([place]) [F10.14] 10/06/2015  . 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 GAD (generalized anxiety disorder) [F41.1]   . 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  World Pharmacologist [facility]). Score between 0-7:  no or low risk or alcohol related problems. Score between 8-15:  moderate risk of alcohol related problems. Score between 16-19:  high risk of alcohol related problems. Score 20 or above:  warrants further diagnostic evaluation for alcohol dependence and treatment.   CLINICAL FACTORS:  49 year old female, history of alcohol dependence- Recently relapsed on alcohol x 1 day. Prior to this had been sober for several weeks to months. In the context of intoxication she blacked out and reportedly made calls to her therapist reporting suicidal ideations. Patient denies any significant depression prior to relapse, but does describe long history of anxiety for which she is on lexapro.     Psychiatric Specialty  Exam: Physical Exam  ROS  Blood pressure 115/84, pulse 88, temperature 97.8 F (36.6 C), temperature source Oral, resp. rate 15, height 5\' 5"  (1.651 m), weight 153 lb (69.4 kg).Body mass index is 25.46 kg/(m^2).   see admit note MSE                                                        COGNITIVE FEATURES THAT CONTRIBUTE TO RISK:  Closed-mindedness and Loss of executive function    SUICIDE RISK:   Moderate:  Frequent suicidal ideation with limited intensity, and duration, some specificity in terms of plans, no associated intent, good self-control, limited dysphoria/symptomatology, some risk factors present, and identifiable protective factors, including available and accessible social support.  PLAN OF CARE: Patient will be admitted to inpatient psychiatric unit for stabilization and safety. Will provide and encourage milieu participation. Provide medication management and maked adjustments as needed.  Will follow daily.    Medical Decision Making:  Established Problem, Stable/Improving (1), Review of Psycho-Social Stressors (1), Review or order clinical lab tests (1) and Review of Medication Regimen & Side Effects (2)  I certify that inpatient services furnished can reasonably be expected to improve the patient's condition.   [name] 10/08/2015, 11:00 AM

## 2015-10-08 NOTE — Tx Team (Signed)
Interdisciplinary Treatment Plan Update (Adult) Date: 10/08/2015   Date: 10/08/2015 5:57 PM  Progress in Treatment:  Attending groups: Yes  Participating in groups: Yes  Taking medication as prescribed: Yes  Tolerating medication: Yes  Family/Significant othe contact made: Yes, with husband Patient understands diagnosis: Yes Discussing patient identified problems/goals with staff: Yes  Medical problems stabilized or resolved: Yes  Denies suicidal/homicidal ideation: Yes Patient has not harmed self or Others: Yes   New problem(s) identified: None identified at this time.   Discharge Plan or Barriers: Pt will return home and follow-up with outpatient providers  Additional comments: n/a   Reason for Continuation of Hospitalization:  Depression Medication stabilization Suicidal ideation  Estimated length of stay: 1 day  Review of initial/current patient goals per problem list:   1.  Goal(s): Patient will participate in aftercare plan  Met:  Yes  Target date: 3-5 days from date of admission   As evidenced by: Patient will participate within aftercare plan AEB aftercare provider and housing plan at discharge being identified.  10/08/2015: Pt will return home and follow-up with outpatient providers  2.  Goal (s): Patient will exhibit decreased depressive symptoms and suicidal ideations.  Met:  Yes  Target date: 3-5 days from date of admission   As evidenced by: Patient will utilize self rating of depression at 3 or below and demonstrate decreased signs of depression or be deemed stable for discharge by MD. 10/08/2015: Pt denies depression and SI; improved mood and affect  Attendees:  Patient:    Family:    Physician: Dr. Parke Poisson, MD  10/08/2015 5:57 PM  Nursing: Lars Pinks, RN Case manager  10/08/2015 5:57 PM  Clinical Social Worker Norman Clay, MSW 10/08/2015 5:57 PM  Other: Jake Bathe Liasion 10/08/2015 5:57 PM  Clinical: Hester Mates, RN 10/08/2015  5:57 PM  Other: , RN Charge Nurse 10/08/2015 5:57 PM  Other:     Peri Maris, [place] MSW

## 2015-10-08 NOTE — Progress Notes (Signed)
Patient did not attend karaoke, was sleeping.

## 2015-10-08 NOTE — Progress Notes (Signed)
D: Pt presents bright in affect and pleasant in mood. Pt reports readiness to go home and continue her [place] meetings. Per pt, she had a visit with her sponsor today. Pt negative for any SI/HI/AVH. Pt reports that she enjoys her job but is looking to cut back on the # of days she works. Pt is complaint with her current POC.  A: Continued support and availability as needed was extended to this pt. Staff continue to monitor pt with q11min checks.  R: Pt receptive to treatment. Pt remains safe at this time.

## 2015-10-08 NOTE — H&P (Signed)
Psychiatric Admission Assessment Adult  Patient Identification: [name] MRN:  [id number] Date of Evaluation:  10/08/2015 Chief Complaint:   " I messed up" Principal Diagnosis:  Alcohol dependence, Alcohol Induced Mood Disorder. Diagnosis:   Patient Active Problem List   Diagnosis Date Noted  . MDD (major depressive disorder) ([place]) [F32.9] 10/07/2015  . Alcohol abuse with alcohol-induced mood disorder ([place]) [F10.14] 10/06/2015  . 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 GAD (generalized anxiety disorder) [F41.1]   . 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 Reports history of alcohol dependence. She had been doing well after completion of an inpatient rehab at [facility] which she had completed successfully August 2015. Marland Kitchen She had recently returned to [place] several months ago, and states  " things were going well, I was working, living at home". She states she relapsed two days ago. Drank 1 bottle of vodka. States " I blacked out so I really don't remember what I said ".  States " looking back there were warning signs- I had not been going to  [employer] for several days" and states she was working most days of week, with little off , states " I felt burnt out ".  States that during her intoxication with alcohol called her therapist , and made suicidal statements . Patient states " I really do not remember - I was blacked out ". Therapist then called 911, and patient was brought to hospital. States that prior to her relapse she was " feeling OK" and was not feeling particularly depressed, sad. She has been experiencing some " stress" related to daily stressors, such as " being tired of working so much", not having time off , needing to change her vehicle's oil, but having difficulty finding the time to do this . At this time she feels better,  and states " I do not want to die". Minimizes depression at present .  Associated Signs/Symptoms: Depression Symptoms:  Prior to her relapse was not feeling depressed and does not endorse neuro-vegetative symptoms of depression. Denies anhedonia, denies sadness, denies suicidal ideations, and appetite, sleep , energy level are all described as normal. (Hypo) Manic Symptoms:  Denies  Anxiety Symptoms:   Does not endorse severe or significant anxiety recently. States she does have a history of panic attacks, but they had been improved, better controlled in several weeks .  Psychotic Symptoms:  Denies  PTSD Symptoms: at this time does not endorse   Total Time spent with patient: 45 minutes  Past Psychiatric History:  Has had prior psychiatric admissions for similar symptoms ( making suicidal statements when intoxicated , or for alcohol detoxification) , no history of suicide attempts, denies history of self cutting , no history of psychosis, no history of mania or hypomania, reports history of GAD and of Panic Disorder, and feels that she used to drink alcohol to self treat these anxiety related symptoms. States, however, that anxiety is much improved compared to previous .  States her episodes of depression have been alcohol induced in the past . She has an outpatient psychiatrist at  [facility]- Dr. Jordan Hawks, and also has therapist Eustaquio Maize ) at [facility] as well.  Risk to Self: Is patient at risk for suicide?: Yes What has been your use of drugs/alcohol within the last 12 months?: 11/2 was stressed, wants to "think" and achieve calm mental state, had not  been consistent in [place] meetings for several days, bought bottle of vodka and consumed whole bottle leading to blackout Risk to Others:   Prior Inpatient Therapy:   Prior Outpatient Therapy:    Alcohol Screening: Patient refused Alcohol Screening Tool: Yes 1. How often do you have a drink containing alcohol?: 2 to 4 times a month 2. How  many drinks containing alcohol do you have on a typical day when you are drinking?: 1 or 2 3. How often do you have six or more drinks on one occasion?: Never Preliminary Score: 0 9. Have you or someone else been injured as a result of your drinking?: No 10. Has a relative or friend or a doctor or another health worker been concerned about your drinking or suggested you cut down?: Yes, during the last year Alcohol Use Disorder Identification Test Final Score (AUDIT): 6 Brief Intervention: Patient declined brief intervention Substance Abuse History in the last 12 months:  Yes- history of alcohol dependence- longest period of sobriety has been 7 months .  Denies history of drug abuse . In the past has been on Antabuse.  Consequences of Substance Abuse: States has history of DTs in the past, no history of seizures, no history of DUIs.  Previous Psychotropic Medications: yes- has been on Lexapro for 2 years.  She states that the Lexapro has been effective , well tolerated .  Psychological Evaluations: No  Past Medical History:  Smokes 5 cigarettes per day, of note, denies history of bladder Cancer, states she did have bladder surgery/sling.  Past Medical History  Diagnosis Date  . Hypertension   . ADHD (attention deficit hyperactivity disorder)   . Anxiety   . SUI (stress urinary incontinence, female)   . History of anemia   . GERD (gastroesophageal reflux disease)   . Cutaneous lupus erythematosus   . Cancer ([place])     bladder  . Anemia     Past Surgical History  Procedure Laterality Date  . Cesarean section  12-23-2004     W/ LEFT TUBAL LIGATION AND RIGHT SALPINGECTOMY  . Cesarean section  1995  . Bladder suspension N/A 02/13/2013    Procedure: [facility] SLING;  Surgeon: Malka So, MD;  Location: [facility];  Service: Urology;  Laterality: N/A;  . Tubal ligation    . Orif proximal humerus fracture Right 04/28/2014    DR CHANDLER  . Orif humerus fracture Right 04/28/2014     Procedure: OPEN REDUCTION INTERNAL FIXATION (ORIF) PROXIMAL HUMERUS FRACTURE;  Surgeon: Nita Sells, MD;  Location: [place];  Service: Orthopedics;  Laterality: Right;   Family History: Parents alive, live together, supportive , has two brothers  Family Psychiatric  History:  Patient states she was adopted as an infant - states that her biological grandfather was alcoholic. Denies any knowledge of suicides in family Social History:  Married x 26 years, has two children- 59, 43 . 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 History  Alcohol Use  . Yes    Comment: was in recovery for 4 months.      History  Drug Use No    Social History   Social History  . Marital Status: Married    Spouse Name: N/A  . Number of Children: N/A  . Years of Education: N/A   Social History Main Topics  . Smoking status:  Current Every Day Smoker -- 0.50 packs/day for 20 years    Types: Cigarettes  . Smokeless tobacco: Never Used  . 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   . Drug Use: No  . Sexual Activity: Yes   Other Topics Concern  . None   Social History Narrative   Additional Social History:   Allergies:   Allergies  Allergen Reactions  . Vicodin [Hydrocodone-Acetaminophen] Nausea And Vomiting and Other (See Comments)    dizzy   Lab Results:  Results for orders placed or performed during the hospital encounter of 10/06/15 (from the past 48 hour(s))  Comprehensive metabolic panel     Status: Abnormal   Collection Time: 10/06/15  1:59 PM  Result Value Ref Range   Sodium 146 (H) 135 - 145 mmol/L   Potassium 4.4 3.5 - 5.1 mmol/L   Chloride 109 101 - 111 mmol/L   CO2 26 22 - 32 mmol/L   Glucose, Bld 114 (H) 65 - 99 mg/dL   BUN 13 6 - 20 mg/dL   Creatinine, Ser 0.72 0.44 - 1.00 mg/dL   Calcium 8.9 8.9 - 10.3 mg/dL    Total Protein 6.4 (L) 6.5 - 8.1 g/dL   Albumin 4.0 3.5 - 5.0 g/dL   AST 28 15 - 41 U/L   ALT 31 14 - 54 U/L   Alkaline Phosphatase 98 38 - 126 U/L   Total Bilirubin 0.2 (L) 0.3 - 1.2 mg/dL   GFR calc non Af Amer >60 >60 mL/min   GFR calc Af Amer >60 >60 mL/min    Comment: (NOTE) The eGFR has been calculated using the CKD EPI equation. This calculation has not been validated in all clinical situations. eGFR's persistently <60 mL/min signify possible Chronic Kidney Disease.    Anion gap 11 5 - 15  Ethanol (ETOH)     Status: Abnormal   Collection Time: 10/06/15  1:59 PM  Result Value Ref Range   Alcohol, Ethyl (B) 231 (H) <5 mg/dL    Comment:        LOWEST DETECTABLE LIMIT FOR SERUM ALCOHOL IS 5 mg/dL FOR MEDICAL PURPOSES ONLY   CBC     Status: None   Collection Time: 10/06/15  1:59 PM  Result Value Ref Range   WBC 5.3 4.0 - 10.5 K/uL   RBC 4.75 3.87 - 5.11 MIL/uL   Hemoglobin 14.8 12.0 - 15.0 g/dL   HCT 46.0 36.0 - 46.0 %   MCV 96.8 78.0 - 100.0 fL   MCH 31.2 26.0 - 34.0 pg   MCHC 32.2 30.0 - 36.0 g/dL   RDW 12.6 11.5 - 15.5 %   Platelets 191 150 - 630 K/uL    Metabolic Disorder Labs:  No results found for: HGBA1C, MPG No results found for: PROLACTIN No results found for: CHOL, TRIG, HDL, CHOLHDL, VLDL, LDLCALC  Current Medications: Current Facility-Administered Medications  Medication Dose Route Frequency Provider Last Rate Last Dose  . alum & mag hydroxide-simeth (MAALOX/MYLANTA) 200-200-20 MG/5ML suspension 30 mL  30 mL Oral Q4H PRN Kerrie Buffalo, NP      . chlordiazePOXIDE (LIBRIUM) capsule 25 mg  25 mg Oral Q6H PRN Kerrie Buffalo, NP      . chlordiazePOXIDE (LIBRIUM) capsule 25 mg  25 mg Oral TID Kerrie Buffalo, NP   25 mg at 10/08/15 0800   Followed by  . [START ON 10/09/2015] chlordiazePOXIDE (LIBRIUM) capsule 25 mg  25 mg Oral BH-qamhs Kerrie Buffalo,  NP       Followed by  . [START ON 10/10/2015] chlordiazePOXIDE (LIBRIUM) capsule 25 mg  25 mg Oral Daily  Kerrie Buffalo, NP      . escitalopram (LEXAPRO) tablet 20 mg  20 mg Oral Daily Kerrie Buffalo, NP   20 mg at 10/08/15 7026  . hydrOXYzine (ATARAX/VISTARIL) tablet 25 mg  25 mg Oral Q6H PRN Kerrie Buffalo, NP      . ibuprofen (ADVIL,MOTRIN) tablet 600 mg  600 mg Oral Q6H PRN Kerrie Buffalo, NP      . loperamide (IMODIUM) capsule 2-4 mg  2-4 mg Oral PRN Kerrie Buffalo, NP      . magnesium hydroxide (MILK OF MAGNESIA) suspension 30 mL  30 mL Oral Daily PRN Kerrie Buffalo, NP      . multivitamin with minerals tablet 1 tablet  1 tablet Oral Daily Kerrie Buffalo, NP   1 tablet at 10/08/15 [phone]  . nicotine polacrilex (NICORETTE) gum 2 mg  2 mg Oral PRN Jenne Campus, MD      . ondansetron (ZOFRAN-ODT) disintegrating tablet 4 mg  4 mg Oral Q6H PRN Kerrie Buffalo, NP      . thiamine (B-1) injection 100 mg  100 mg Intramuscular Once Laverle Hobby, PA-C   100 mg at 10/07/15 0006  . thiamine (VITAMIN B-1) tablet 100 mg  100 mg Oral Daily Kerrie Buffalo, NP   100 mg at 10/08/15 0811  . traZODone (DESYREL) tablet 50 mg  50 mg Oral QHS,MR X 1 Kerrie Buffalo, NP   50 mg at 10/07/15 2307   PTA Medications: Prescriptions prior to admission  Medication Sig Dispense Refill Last Dose  . Cholecalciferol 1000 UNITS TBDP Take 1,000 Units by mouth daily.   10/06/2015 at Unknown time  . escitalopram (LEXAPRO) 20 MG tablet Take 20 mg by mouth daily.   10/06/2015 at Unknown time  . HYDROmorphone (DILAUDID) 2 MG tablet 1-3 tabs every 4 hours prn pain (Patient not taking: Reported on 10/06/2015) 60 tablet 0 Not Taking at Unknown time    Musculoskeletal: Strength & Muscle Tone: within normal limits Gait & Station: normal Patient leans: N/A  Psychiatric Specialty Exam: Physical Exam  Review of Systems  Constitutional: Negative.   HENT: Negative.   Eyes: Negative.   Respiratory: Negative.   Cardiovascular: Negative.   Gastrointestinal: Negative.   Genitourinary: Negative.   Musculoskeletal: Negative.   Skin:  Negative.   Neurological: Negative for seizures.  Endo/Heme/Allergies: Negative.   Psychiatric/Behavioral: Positive for depression, suicidal ideas and substance abuse.  All other systems reviewed and are negative.   Blood pressure 115/84, pulse 88, temperature 97.8 F (36.6 C), temperature source Oral, resp. rate 15, height 5' 5"  (1.651 m), weight 153 lb (69.4 kg).Body mass index is 25.46 kg/(m^2).  General Appearance: Well Groomed  Engineer, water::  Good  Speech:  Normal Rate  Volume:  Normal  Mood:  denies depression at this time, does present with some anxiety  Affect:  Appropriate and vaguely anxious, not constricted or sad  Thought Process:  Goal Directed and Linear  Orientation:  Full (Time, Place, and Person)  Thought Content:  denies hallucinations, no delusions, not internally preoccupied   Suicidal Thoughts:  No- at this time denies any suicidal ideations, denies any self injurious ideations  Homicidal Thoughts:  No  Memory:  recent and remote grossly intact   Judgement:  Other:  improved  Insight:  Present  Psychomotor Activity:  Normal  Concentration:  Good  Recall:  Good  Fund of Knowledge:Good  Language: Good  Akathisia:  Negative  Handed:  Right  AIMS (if indicated):     Assets:  Communication Skills Desire for Improvement Resilience  ADL's:  Intact  Cognition: WNL  Sleep:  Number of Hours: 6     Treatment Plan Summary: Daily contact with patient to assess and evaluate symptoms and progress in treatment, Medication management, Plan inpatient admission and medications as below   Observation Level/Precautions:  15 minute checks  Laboratory:  EKG   Psychotherapy:   Supportive, milieu  Medications:  Patient states lexapro has been well tolerated and effective- was taking it at 20 mgrs QDAY  Continue LEXAPRO  20 mgrs QDAY - TRAZODONE PRN for insomnia At this time no indication for Alcohol WDL protocol as patient only drank x 1 day and is not presenting with any  WDL symptoms  Consultations:  As needed   Discharge Concerns:  -   Estimated LOS: 4 days   Other:     I certify that inpatient services furnished can reasonably be expected to improve the patient's condition.   COBOS, FERNANDO 11/4/20169:12 AM

## 2015-10-08 NOTE — [facility] Group Notes (Signed)
[facility] LCSW Aftercare Discharge Planning Group Note  10/08/2015 8:45 AM   Pt did not attend, declined invitation.   Peri Maris, [place] 10/08/2015 5:56 PM

## 2015-10-08 NOTE — Progress Notes (Signed)
D-pt sts she had a good night and morning, denies depression & anxiety, pt wants to be discharged today due to wanting to return to work A-pt attended group this am and took her medications R-cont to monitor for safety

## 2015-10-09 DIAGNOSIS — F1014 Alcohol abuse with alcohol-induced mood disorder: Principal | ICD-10-CM

## 2015-10-09 MED ORDER — HYDROXYZINE HCL 25 MG PO TABS: 25.0000 mg | ORAL_TABLET | Freq: Four times a day (QID) | ORAL | Status: DC | PRN

## 2015-10-09 MED ORDER — ESCITALOPRAM OXALATE 20 MG PO TABS: 20.0000 mg | ORAL_TABLET | Freq: Every day | ORAL | Status: DC

## 2015-10-09 MED ORDER — TRAZODONE HCL 50 MG PO TABS: 50.0000 mg | ORAL_TABLET | Freq: Every evening | ORAL | Status: DC | PRN

## 2015-10-09 MED ORDER — NICOTINE POLACRILEX 2 MG MT GUM: 2.0000 mg | CHEWING_GUM | OROMUCOSAL | Status: DC | PRN

## 2015-10-09 NOTE — Progress Notes (Addendum)
[name] is seen by the physician first thing this morning. The physician completed her DC SRA. Pt  contracts for safety and denies active SI and she  writes this on the  self assessment  She completed and handed in this morning. She rated her depression,, hopelessness and anxiety " 0/0/0/", respectively. Her AVS is reviewed with her by this Probation officer and pt states she understands and will comply.  Pt is given prescriptions for medicaitons and then Pt's belongings returned to her and she is escorted to the entrance  Of the bldg and dc'd.

## 2015-10-09 NOTE — BHH Group Notes (Signed)
[facility] Group Notes:  (Nursing/MHT/Case Management/Adjunct)  Date:  10/09/2015  Time:  11:39 AM  Type of Therapy:  Psychoeducational Skills  Participation Level:  Active  Participation Quality:  Appropriate  Affect:  Appropriate  Cognitive:  Appropriate  Insight:  Appropriate  Engagement in Group:  Engaged  Modes of Intervention:  Discussion  Summary of Progress/Problems: Pt did attend self healthy coping skills group.   [name] 10/09/2015, 11:39 AM

## 2015-10-09 NOTE — BHH Group Notes (Signed)
[facility] Group Notes: (Clinical Social Work)   10/09/2015      Type of Therapy:  Group Therapy   Participation Level:  Did Not Attend despite MHT prompting - possibly already discharged    Selmer Dominion, LCSW 10/09/2015, 3:19 PM

## 2015-10-09 NOTE — BHH Group Notes (Signed)
[place] [facility] [place] Group Notes:  (Nursing/MHT/Case Management/Adjunct)  Date:  10/09/2015  Time:  9:53 AM  Type of Therapy:  Psychoeducational Skills  Participation Level:  Active  Participation Quality:  Appropriate  Affect:  Appropriate  Cognitive:  Appropriate  Insight:  Appropriate  Engagement in Group:  Engaged  Modes of Intervention:  Discussion  Summary of Progress/Problems: Pt did attend self inventory group.       Benancio Deeds Shanta 10/09/2015, 9:53 AM

## 2015-10-09 NOTE — Discharge Summary (Signed)
Physician Discharge Summary Note  Patient:  [name] is an 49 y.o., female MRN:  [id number] DOB:  06-05-1966 Patient phone:  [phone] (home)  Patient address:   [place] [address],  Total Time spent with patient: 45 minutes  Date of Admission:  10/06/2015 Date of Discharge: 10/09/2015  Reason for Admission:   History of Present Illness:: 49 year old female. Reports history of alcohol dependence. She had been doing well after completion of an inpatient rehab at [facility] which she had completed successfully August 2015. Marland Kitchen She had recently returned to [place] several months ago, and states " things were going well, I was working, living at home". She states she relapsed two days ago. Drank 1 bottle of vodka. States " I blacked out so I really don't remember what I said ".States " looking back there were warning signs- I had not been going to [employer] for several days" and states she was working most days of week, with little off , states " I felt burnt out ". States that during her intoxication with alcohol called her therapist , and made suicidal statements . Patient states " I really do not remember - I was blacked out ". Therapist then called 911, and patient was brought to hospital. States that prior to her relapse she was " feeling OK" and was not feeling particularly depressed, sad. She has been experiencing some " stress" related to daily stressors, such as " being tired of working so much", not having time off , needing to change her vehicle's oil, but having difficulty finding the time to do this . At this time she feels better, and states " I do not want to die". Minimizes depression at present .  Principal Problem: Alcohol abuse with alcohol-induced mood disorder [facility]) Discharge Diagnoses: Patient Active Problem List   Diagnosis Date Noted  . MDD (major depressive disorder) ([place]) [F32.9] 10/07/2015  . Alcohol abuse with  alcohol-induced mood disorder ([place]) [F10.14] 10/06/2015  . 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 GAD (generalized anxiety disorder) [F41.1]   . 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 Negative for suicidal ideas, hallucinations and substance abuse. The patient is nervous/anxious and has insomnia.   All other systems reviewed and are negative.   Blood pressure 117/77, pulse 69, temperature 97.5 F (36.4 C), temperature source Oral, resp. rate 18, height 5' 5" (1.651 m), weight 69.4 kg (153 lb).Body mass index is 25.46 kg/(m^2).  SEE MD PSE within the SRA   Have you used any form of tobacco in the last 30 days? (Cigarettes, Smokeless Tobacco, Cigars, and/or Pipes): Yes  Has this patient used any form of tobacco in the last 30 days? (Cigarettes, Smokeless Tobacco, Cigars, and/or Pipes) Yes, A prescription for an FDA-approved tobacco cessation medication was offered at discharge and the patient accepted.   Past Medical History:  Past Medical History  Diagnosis Date  . Hypertension   . ADHD (attention deficit hyperactivity disorder)   . Anxiety   . SUI (stress urinary incontinence, female)   . History of anemia   . GERD (gastroesophageal reflux disease)   . Cutaneous lupus erythematosus   . Cancer ([place])     bladder  . Anemia  Past Surgical History  Procedure Laterality Date  . Cesarean section  12-23-2004     W/ LEFT TUBAL LIGATION AND RIGHT SALPINGECTOMY  . Cesarean section  1995  . Bladder suspension N/A 02/13/2013    Procedure: SPARC SLING;  Surgeon:  J Wrenn, MD;  Location: [facility];  Service: Urology;  Laterality: N/A;  . Tubal ligation    . Orif proximal  humerus fracture Right 04/28/2014    DR CHANDLER  . Orif humerus fracture Right 04/28/2014    Procedure: OPEN REDUCTION INTERNAL FIXATION (ORIF) PROXIMAL HUMERUS FRACTURE;  Surgeon: Justin William Chandler, MD;  Location: [facility] OR;  Service: Orthopedics;  Laterality: Right;   Family History: History reviewed. No pertinent family history. Social History:  History  Alcohol Use  . Yes    Comment: was in recovery for 4 months.      History  Drug Use No    Social History   Social History  . Marital Status: Married    Spouse Name: N/A  . Number of Children: N/A  . Years of Education: N/A   Social History Main Topics  . Smoking status: Current Every Day Smoker -- 0.50 packs/day for 20 years    Types: Cigarettes  . Smokeless tobacco: Never Used  . 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   . Drug Use: No  . Sexual Activity: Yes   Other Topics Concern  . None   Social History Narrative    Risk to Self: Is patient at risk for suicide?: Yes What has been your use of drugs/alcohol within the last 12 months?: 11/2 was stressed, wants to "think" and achieve calm mental state, had not been consistent in AA meetings for several days, bought bottle of vodka and consumed whole bottle leading to blackout Risk to Others:   Prior Inpatient Therapy:   Prior Outpatient Therapy:    Level of Care:  OP  Hospital Course:   [name] was admitted for Alcohol abuse with alcohol-induced mood disorder (HCC), and crisis management.  Pt was treated discharged with the medications listed below under Medication List.  Medical problems were identified and treated as needed.  Home medications were restarted as appropriate.  Improvement was monitored by observation and [name] 's daily report of symptom reduction.  Emotional and mental status was monitored by daily self-inventory reports completed by [name] and clinical staff.         [name] was evaluated by the  treatment team for stability and plans for continued recovery upon discharge. [name] 's motivation was an integral factor for scheduling further treatment. Employment, transportation, bed availability, health status, family support, and any pending legal issues were also considered during hospital stay. Pt was offered further treatment options upon discharge including but not limited to Residential, Intensive Outpatient, and Outpatient treatment.  Tim M Mccubbin will follow up with the services as listed below under Follow Up Information.     Upon completion of this admission the patient was both mentally and medically stable for discharge denying suicidal/homicidal ideation, auditory/visual/tactile hallucinations, delusional thoughts and paranoia.   Consults:  None  Significant Diagnostic Studies:  BAL 327, UDS positive for opiates  Discharge Vitals:   Blood pressure 117/77, pulse 69, temperature 97.5 F (36.4 C), temperature source Oral, resp. rate 18, height 5' 5" (1.651 m), weight 69.4 kg (153 lb). Body mass index is 25.46 kg/(m^2). Lab Results:     Results for orders placed or performed during the hospital encounter of 10/06/15 (from the past 72 hour(s))  Comprehensive metabolic panel     Status: Abnormal   Collection Time: 10/06/15  1:59 PM  Result Value Ref Range   Sodium 146 (H) 135 - 145 mmol/L   Potassium 4.4 3.5 - 5.1 mmol/L   Chloride 109 101 - 111 mmol/L   CO2 26 22 - 32 mmol/L   Glucose, Bld 114 (H) 65 - 99 mg/dL   BUN 13 6 - 20 mg/dL   Creatinine, Ser 0.72 0.44 - 1.00 mg/dL   Calcium 8.9 8.9 - 10.3 mg/dL   Total Protein 6.4 (L) 6.5 - 8.1 g/dL   Albumin 4.0 3.5 - 5.0 g/dL   AST 28 15 - 41 U/L   ALT 31 14 - 54 U/L   Alkaline Phosphatase 98 38 - 126 U/L   Total Bilirubin 0.2 (L) 0.3 - 1.2 mg/dL   GFR calc non Af Amer >60 >60 mL/min   GFR calc Af Amer >60 >60 mL/min    Comment: (NOTE) The eGFR has been calculated using the CKD EPI equation. This calculation has not  been validated in all clinical situations. eGFR's persistently <60 mL/min signify possible Chronic Kidney Disease.    Anion gap 11 5 - 15  Ethanol (ETOH)     Status: Abnormal   Collection Time: 10/06/15  1:59 PM  Result Value Ref Range   Alcohol, Ethyl (B) 231 (H) <5 mg/dL    Comment:        LOWEST DETECTABLE LIMIT FOR SERUM ALCOHOL IS 5 mg/dL FOR MEDICAL PURPOSES ONLY   CBC     Status: None   Collection Time: 10/06/15  1:59 PM  Result Value Ref Range   WBC 5.3 4.0 - 10.5 K/uL   RBC 4.75 3.87 - 5.11 MIL/uL   Hemoglobin 14.8 12.0 - 15.0 g/dL   HCT 46.0 36.0 - 46.0 %   MCV 96.8 78.0 - 100.0 fL   MCH 31.2 26.0 - 34.0 pg   MCHC 32.2 30.0 - 36.0 g/dL   RDW 12.6 11.5 - 15.5 %   Platelets 191 150 - 400 K/uL    Physical Findings: AIMS: Facial and Oral Movements Muscles of Facial Expression: None, normal Lips and Perioral Area: None, normal Jaw: None, normal Tongue: None, normal,Extremity Movements Upper (arms, wrists, hands, fingers): None, normal Lower (legs, knees, ankles, toes): None, normal, Trunk Movements Neck, shoulders, hips: None, normal, Overall Severity Severity of abnormal movements (highest score from questions above): None, normal Incapacitation due to abnormal movements: None, normal Patient's awareness of abnormal movements (rate only patient's report): No Awareness, Dental Status Current problems with teeth and/or dentures?: No Does patient usually wear dentures?: No  CIWA:  CIWA-Ar Total: 1 COWS:     See Psychiatric Specialty Exam and Suicide Risk Assessment completed by Attending Physician prior to discharge.  Discharge destination:  Home  Is patient on multiple antipsychotic therapies at discharge:  No   Has Patient had three or more failed trials of antipsychotic monotherapy by history:  No    Recommended Plan for Multiple Antipsychotic Therapies: NA     Medication List    STOP taking these medications        Cholecalciferol 1000 UNITS Tbdp      HYDROmorphone 2 MG tablet  Commonly known as:  DILAUDID      TAKE these medications      Indication   escitalopram 20 MG tablet  Commonly known   asLoma Sousa  Take 1 tablet (20 mg total) by mouth daily.   Indication:  Depression     hydrOXYzine 25 MG tablet  Commonly known as:  ATARAX/VISTARIL  Take 1 tablet (25 mg total) by mouth every 6 (six) hours as needed for anxiety.   Indication:  Anxiety Neurosis     nicotine polacrilex 2 MG gum  Commonly known as:  NICORETTE  Take 1 each (2 mg total) by mouth as needed for smoking cessation.   Indication:  Nicotine Addiction     traZODone 50 MG tablet  Commonly known as:  DESYREL  Take 1 tablet (50 mg total) by mouth at bedtime as needed for sleep.   Indication:  Trouble Sleeping           Follow-up Information    Follow up with [facility] On 10/13/2015.   Why:  Patient current w Dr Silvio Pate for medications management, next appt is 10/13/15 at 2 PM per patient.   Contact information:   [address]  [place], [place] [address] Phone: [phone]      Follow up with [facility] On 10/13/2015.   Why:  at 3:00pm with Sharmon Revere for therapy.   Contact information:   [place], [place] [address] [phone]      Follow-up recommendations:  Activity:  As tolerated Diet:  Heart healthy with low sodium. Other:  Followup as above  Comments:  Take all medications as prescribed.  Keep all follow-up appointments as scheduled.  Do not consume alcohol or use illegal drugs while on prescription medications. Report any adverse effects from your medications to your primary care provider promptly.  In the event of recurrent symptoms or worsening symptoms, call 911, a crisis hotline, or go to the nearest emergency department for evaluation.    Total Discharge Time: Greater than 30 minutes  Signed: Benjamine Mola, FNP-BC 10/09/2015, 10:04 AM

## 2015-10-09 NOTE — Plan of Care (Signed)
Problem: Diagnosis: Increased Risk For Suicide Attempt Goal: LTG-Patient Will Report Absence of Withdrawal Symptoms LTG (by discharge): Patient will report absence of withdrawal symptoms.  Outcome: Completed/Met Date Met:  10/09/15 No active withdrawal symptoms.

## 2015-10-09 NOTE — BHH Suicide Risk Assessment (Signed)
[facility] Discharge Suicide Risk Assessment   Demographic Factors:  Caucasian and 49 year old married female, lives with husband, child. Employed   Total Time spent with patient: 30 minutes  Musculoskeletal: Strength & Muscle Tone: within normal limits Gait & Station: normal Patient leans: N/A  Psychiatric Specialty Exam: Physical Exam  Review of Systems  Constitutional: Negative for fever and chills.  HENT: Negative for sore throat.   Eyes: Negative for blurred vision and pain.  Respiratory: Negative for cough, shortness of breath and wheezing.   Cardiovascular: Negative for chest pain and leg swelling.  Gastrointestinal: Negative for heartburn, nausea, vomiting and abdominal pain.  Musculoskeletal: Negative for back pain, joint pain and neck pain.  Skin: Negative for itching and rash.  Neurological: Positive for headaches. Negative for dizziness, tremors, seizures and loss of consciousness.  Psychiatric/Behavioral: Negative for depression, suicidal ideas, hallucinations and substance abuse. The patient is not nervous/anxious and does not have insomnia.     Blood pressure 113/69, pulse 67, temperature 97.8 F (36.6 C), temperature source Oral, resp. rate 15, height 5\' 5"  (1.651 m), weight 69.4 kg (153 lb).Body mass index is 25.46 kg/(m^2).  General Appearance: Casual  Eye Contact::  Good  Speech:  Clear and Coherent and Normal Rate  Volume:  Normal  Mood:  Euthymic  Affect:  Full Range  Thought Process:  Goal Directed  Orientation:  Full (Time, Place, and Person)  Thought Content:  Negative  Suicidal Thoughts:  No  Homicidal Thoughts:  No  Memory:  Immediate;   Good Recent;   Good Remote;   Good  Judgement:  Good  Insight:  Good  Psychomotor Activity:  Normal  Concentration:  Good  Recall:  Good  Fund of Knowledge:Good  Language: Good  Akathisia:  No  Handed:  Right  AIMS (if indicated):     Assets:  Communication Skills Desire for Improvement Financial  Resources/Insurance Housing Intimacy Leisure Time Physical Health Resilience Social Support Talents/Skills Transportation  Sleep:  Number of Hours: 6  Cognition: WNL  ADL's:  Intact   Have you used any form of tobacco in the last 30 days? (Cigarettes, Smokeless Tobacco, Cigars, and/or Pipes): Yes  Has this patient used any form of tobacco in the last 30 days? (Cigarettes, Smokeless Tobacco, Cigars, and/or Pipes) Yes, Prescription not provided because: pt declined. She doesn't want to quit yet  Mental Status Per Nursing Assessment::   On Admission:     Current Mental Status by Physician: Pt states her sponser came to see her yesterday. She is has set up several actitivies on d/c. She realizes she needs to be around others. Pt will take another day off work to give herself a little break. Today denies depressiion, anhedonia, worthlessness and hopelessness. Denies SI/HI. States she missed a few AA meetings and relapsed on alcohol.  Pt is future oriented. Today denies any withdrawal symptoms from alcohol use  Loss Factors: Financial problems/change in socioeconomic status and alcohol relapse  Historical Factors: hx of alcohol dependence  Risk Reduction Factors:   Responsible for children under 34 years of age, Sense of responsibility to family, Employed, Living with another person, especially a relative, Positive social support, Positive therapeutic relationship and Positive coping skills or problem solving skills  Continued Clinical Symptoms:  Previous Psychiatric Diagnoses and Treatments  Cognitive Features That Contribute To Risk:  None    Suicide Risk:  Minimal: No identifiable suicidal ideation.  Patients presenting with no risk factors but with morbid ruminations; may be classified as  minimal risk based on the severity of the depressive symptoms  Principal Problem: Alcohol abuse with alcohol-induced mood disorder [facility]) Discharge Diagnoses:  Patient Active Problem List    Diagnosis Date Noted  . MDD (major depressive disorder) ([place]) [F32.9] 10/07/2015  . Alcohol abuse with alcohol-induced mood disorder ([place]) [F10.14] 10/06/2015  . 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 GAD (generalized anxiety disorder) [F41.1]   . Fracture of proximal humerus with nonunion [S42.209K] 04/28/2014    Follow-up Information    Follow up with [facility] On 10/13/2015.   Why:  Patient current w Dr Silvio Pate for medications management, next appt is 10/13/15 at 2 PM per patient.   Contact information:   [address],  [place], [place] [address] Phone: [phone]      Follow up with [facility] On 10/13/2015.   Why:  at 3:00pm with Sharmon Revere for therapy.   Contact information:   [place], [place] [address] [phone]      Plan Of Care/Follow-up recommendations:  Activity:  as tolerated Diet:  resume normal diet Other:  f/u with psychiatrist and medical doctor. Continue to go to [employer], avoid alcohol.   Continue LEXAPRO 20 mgrs QDAY  Declined script for Campral and Antabuse as pt is not having cravings.   At this time no indication for Alcohol WDL protocol as patient only drank x 1 day and is not presenting with any WDL symptoms   Is patient on multiple antipsychotic therapies at discharge:  No   Has Patient had three or more failed trials of antipsychotic monotherapy by history:  No  Recommended Plan for Multiple Antipsychotic Therapies: NA    [name] 10/09/2015, 8:03 AM

## 2016-01-04 ENCOUNTER — Emergency Department (HOSPITAL_COMMUNITY): Payer: 59

## 2016-01-04 ENCOUNTER — Emergency Department (HOSPITAL_COMMUNITY)
Admission: EM | Admit: 2016-01-04 | Discharge: 2016-01-04 | Disposition: A | Discharge status: Other Acute Inpatient Hospital | Payer: 59 | Attending: Emergency Medicine | Admitting: Emergency Medicine

## 2016-01-04 ENCOUNTER — Encounter (HOSPITAL_COMMUNITY): Payer: Self-pay | Admitting: Emergency Medicine

## 2016-01-04 ENCOUNTER — Inpatient Hospital Stay (HOSPITAL_COMMUNITY)
Admission: AD | Admit: 2016-01-04 | Discharge: 2016-01-10 | DRG: 897 | Disposition: A | Discharge status: Home / Self Care | Payer: 59 | Source: Intra-hospital | Attending: Psychiatry | Admitting: Psychiatry

## 2016-01-04 ENCOUNTER — Encounter (HOSPITAL_COMMUNITY): Payer: Self-pay | Admitting: *Deleted

## 2016-01-04 DIAGNOSIS — I10 Essential (primary) hypertension: Secondary | ICD-10-CM | POA: Diagnosis present

## 2016-01-04 DIAGNOSIS — S7002XA Contusion of left hip, initial encounter: Secondary | ICD-10-CM | POA: Diagnosis not present

## 2016-01-04 DIAGNOSIS — S9032XA Contusion of left foot, initial encounter: Secondary | ICD-10-CM | POA: Insufficient documentation

## 2016-01-04 DIAGNOSIS — S7001XA Contusion of right hip, initial encounter: Secondary | ICD-10-CM | POA: Diagnosis not present

## 2016-01-04 DIAGNOSIS — S3991XA Unspecified injury of abdomen, initial encounter: Secondary | ICD-10-CM | POA: Insufficient documentation

## 2016-01-04 DIAGNOSIS — Y9389 Activity, other specified: Secondary | ICD-10-CM | POA: Diagnosis not present

## 2016-01-04 DIAGNOSIS — F909 Attention-deficit hyperactivity disorder, unspecified type: Secondary | ICD-10-CM | POA: Diagnosis not present

## 2016-01-04 DIAGNOSIS — F1310 Sedative, hypnotic or anxiolytic abuse, uncomplicated: Secondary | ICD-10-CM | POA: Insufficient documentation

## 2016-01-04 DIAGNOSIS — Z862 Personal history of diseases of the blood and blood-forming organs and certain disorders involving the immune mechanism: Secondary | ICD-10-CM | POA: Diagnosis not present

## 2016-01-04 DIAGNOSIS — F419 Anxiety disorder, unspecified: Secondary | ICD-10-CM | POA: Diagnosis not present

## 2016-01-04 DIAGNOSIS — F10229 Alcohol dependence with intoxication, unspecified: Secondary | ICD-10-CM | POA: Diagnosis present

## 2016-01-04 DIAGNOSIS — R45851 Suicidal ideations: Secondary | ICD-10-CM

## 2016-01-04 DIAGNOSIS — F17210 Nicotine dependence, cigarettes, uncomplicated: Secondary | ICD-10-CM | POA: Insufficient documentation

## 2016-01-04 DIAGNOSIS — K219 Gastro-esophageal reflux disease without esophagitis: Secondary | ICD-10-CM | POA: Insufficient documentation

## 2016-01-04 DIAGNOSIS — Y998 Other external cause status: Secondary | ICD-10-CM | POA: Diagnosis not present

## 2016-01-04 DIAGNOSIS — F332 Major depressive disorder, recurrent severe without psychotic features: Secondary | ICD-10-CM | POA: Diagnosis present

## 2016-01-04 DIAGNOSIS — F329 Major depressive disorder, single episode, unspecified: Secondary | ICD-10-CM | POA: Diagnosis present

## 2016-01-04 DIAGNOSIS — S29001A Unspecified injury of muscle and tendon of front wall of thorax, initial encounter: Secondary | ICD-10-CM | POA: Diagnosis not present

## 2016-01-04 DIAGNOSIS — Z3202 Encounter for pregnancy test, result negative: Secondary | ICD-10-CM | POA: Diagnosis not present

## 2016-01-04 DIAGNOSIS — Y92410 Unspecified street and highway as the place of occurrence of the external cause: Secondary | ICD-10-CM | POA: Diagnosis not present

## 2016-01-04 DIAGNOSIS — X828XXA Other intentional self-harm by crashing of motor vehicle, initial encounter: Secondary | ICD-10-CM | POA: Insufficient documentation

## 2016-01-04 DIAGNOSIS — F1014 Alcohol abuse with alcohol-induced mood disorder: Secondary | ICD-10-CM | POA: Diagnosis present

## 2016-01-04 DIAGNOSIS — F411 Generalized anxiety disorder: Secondary | ICD-10-CM | POA: Diagnosis present

## 2016-01-04 DIAGNOSIS — Z8551 Personal history of malignant neoplasm of bladder: Secondary | ICD-10-CM | POA: Diagnosis not present

## 2016-01-04 LAB — COMPREHENSIVE METABOLIC PANEL
ALT: 30 U/L (ref 14–54)
AST: 35 U/L (ref 15–41)
Albumin: 3.8 g/dL (ref 3.5–5.0)
Alkaline Phosphatase: 95 U/L (ref 38–126)
Anion gap: 11 (ref 5–15)
BILIRUBIN TOTAL: 0.6 mg/dL (ref 0.3–1.2)
BUN: 9 mg/dL (ref 6–20)
CHLORIDE: 112 mmol/L — AB (ref 101–111)
CO2: 22 mmol/L (ref 22–32)
Calcium: 9.1 mg/dL (ref 8.9–10.3)
Creatinine, Ser: 0.65 mg/dL (ref 0.44–1.00)
GFR calc Af Amer: >60 mL/min (ref >60)
GFR calc non Af Amer: >60 mL/min (ref >60)
GLUCOSE: 84 mg/dL (ref 65–99)
POTASSIUM: 3.3 mmol/L — AB (ref 3.5–5.1)
Sodium: 145 mmol/L (ref 135–145)
TOTAL PROTEIN: 6.3 g/dL — AB (ref 6.5–8.1)

## 2016-01-04 LAB — CBC WITH DIFFERENTIAL/PLATELET
BASOS ABS: 0 10*3/uL (ref 0.0–0.1)
Basophils Relative: 1 %
EOS PCT: 2 %
Eosinophils Absolute: 0.1 10*3/uL (ref 0.0–0.7)
HEMATOCRIT: 43.8 % (ref 36.0–46.0)
Hemoglobin: 14.1 g/dL (ref 12.0–15.0)
LYMPHS ABS: 1.8 10*3/uL (ref 0.7–4.0)
LYMPHS PCT: 28 %
MCH: 31 pg (ref 26.0–34.0)
MCHC: 32.2 g/dL (ref 30.0–36.0)
MCV: 96.3 fL (ref 78.0–100.0)
MONO ABS: 0.4 10*3/uL (ref 0.1–1.0)
MONOS PCT: 7 %
NEUTROS ABS: 4.1 10*3/uL (ref 1.7–7.7)
Neutrophils Relative %: 62 %
PLATELETS: 184 10*3/uL (ref 150–400)
RBC: 4.55 MIL/uL (ref 3.87–5.11)
RDW: 12.4 % (ref 11.5–15.5)
WBC: 6.5 10*3/uL (ref 4.0–10.5)

## 2016-01-04 LAB — RAPID URINE DRUG SCREEN, HOSP PERFORMED
Amphetamines: NOT DETECTED
Barbiturates: NOT DETECTED
Benzodiazepines: POSITIVE — AB
COCAINE: NOT DETECTED
OPIATES: NOT DETECTED
Tetrahydrocannabinol: NOT DETECTED

## 2016-01-04 LAB — URINALYSIS, ROUTINE W REFLEX MICROSCOPIC
Bilirubin Urine: NEGATIVE
Glucose, UA: NEGATIVE mg/dL
Hgb urine dipstick: NEGATIVE
Ketones, ur: NEGATIVE mg/dL
Leukocytes, UA: NEGATIVE
NITRITE: NEGATIVE
Protein, ur: NEGATIVE mg/dL
SPECIFIC GRAVITY, URINE: 1.016 (ref 1.005–1.030)
pH: 6.5 (ref 5.0–8.0)

## 2016-01-04 LAB — I-STAT CG4 LACTIC ACID, ED: Lactic Acid, Venous: 4.33 mmol/L (ref 0.5–2.0)

## 2016-01-04 LAB — SALICYLATE LEVEL

## 2016-01-04 LAB — ETHANOL: ALCOHOL ETHYL (B): 319 mg/dL — AB (ref <5)

## 2016-01-04 LAB — POC URINE PREG, ED: Preg Test, Ur: NEGATIVE

## 2016-01-04 LAB — LIPASE, BLOOD: Lipase: 67 U/L — ABNORMAL HIGH (ref 11–51)

## 2016-01-04 LAB — ACETAMINOPHEN LEVEL

## 2016-01-04 MED ORDER — HYDROXYZINE HCL 25 MG PO TABS: 25.0000 mg | ORAL_TABLET | Freq: Four times a day (QID) | ORAL | Status: DC | PRN

## 2016-01-04 MED ORDER — TRAZODONE HCL 50 MG PO TABS
50.0000 mg | ORAL_TABLET | Freq: Every evening | ORAL | Status: DC | PRN
Start: 2016-01-04 — End: 2016-01-10
  Administered 2016-01-04 – 2016-01-08 (×3): 50 mg via ORAL
  Filled 2016-01-04 (×2): qty 1

## 2016-01-04 MED ORDER — SODIUM CHLORIDE 0.9 % IV BOLUS (SEPSIS)
2000.0000 mL | Freq: Once | INTRAVENOUS | Status: AC
  Administered 2016-01-04: 2000 mL via INTRAVENOUS

## 2016-01-04 MED ORDER — ESCITALOPRAM OXALATE 10 MG PO TABS
20.0000 mg | ORAL_TABLET | Freq: Every day | ORAL | Status: DC
  Filled 2016-01-04: qty 2

## 2016-01-04 MED ORDER — IBUPROFEN 600 MG PO TABS
600.0000 mg | ORAL_TABLET | Freq: Four times a day (QID) | ORAL | Status: DC | PRN
  Administered 2016-01-04: 600 mg via ORAL
  Filled 2016-01-04: qty 1

## 2016-01-04 MED ORDER — NICOTINE POLACRILEX 2 MG MT GUM: 2.0000 mg | CHEWING_GUM | OROMUCOSAL | Status: DC | PRN

## 2016-01-04 MED ORDER — CHLORDIAZEPOXIDE HCL 25 MG PO CAPS
25.0000 mg | ORAL_CAPSULE | ORAL | Status: AC
  Administered 2016-01-07 (×2): 25 mg via ORAL
  Filled 2016-01-04 (×2): qty 1

## 2016-01-04 MED ORDER — IOHEXOL 300 MG/ML  SOLN
100.0000 mL | Freq: Once | INTRAMUSCULAR | Status: AC | PRN
  Administered 2016-01-04: 100 mL via INTRAVENOUS

## 2016-01-04 MED ORDER — CHLORDIAZEPOXIDE HCL 25 MG PO CAPS: 25.0000 mg | ORAL_CAPSULE | Freq: Four times a day (QID) | ORAL | Status: AC | PRN

## 2016-01-04 MED ORDER — ONDANSETRON 4 MG PO TBDP
4.0000 mg | ORAL_TABLET | Freq: Four times a day (QID) | ORAL | Status: AC | PRN
  Administered 2016-01-06 – 2016-01-07 (×2): 4 mg via ORAL
  Filled 2016-01-04 (×2): qty 1

## 2016-01-04 MED ORDER — THIAMINE HCL 100 MG/ML IJ SOLN: 100.0000 mg | Freq: Once | INTRAMUSCULAR | Status: DC

## 2016-01-04 MED ORDER — HYDROXYZINE HCL 25 MG PO TABS
25.0000 mg | ORAL_TABLET | Freq: Four times a day (QID) | ORAL | Status: DC | PRN
  Administered 2016-01-06 – 2016-01-09 (×4): 25 mg via ORAL
  Filled 2016-01-04 (×4): qty 1

## 2016-01-04 MED ORDER — ESCITALOPRAM OXALATE 20 MG PO TABS
20.0000 mg | ORAL_TABLET | Freq: Every day | ORAL | Status: DC
  Filled 2016-01-04: qty 2
  Filled 2016-01-04 (×3): qty 1

## 2016-01-04 MED ORDER — DIAZEPAM 5 MG/ML IJ SOLN
10.0000 mg | Freq: Once | INTRAMUSCULAR | Status: AC
  Administered 2016-01-04: 10 mg via INTRAVENOUS
  Filled 2016-01-04: qty 2

## 2016-01-04 MED ORDER — DIPHENHYDRAMINE HCL 50 MG/ML IJ SOLN
50.0000 mg | Freq: Once | INTRAMUSCULAR | Status: AC
  Administered 2016-01-04: 50 mg via INTRAVENOUS
  Filled 2016-01-04: qty 1

## 2016-01-04 MED ORDER — CHLORDIAZEPOXIDE HCL 25 MG PO CAPS
25.0000 mg | ORAL_CAPSULE | Freq: Four times a day (QID) | ORAL | Status: AC
  Administered 2016-01-04 – 2016-01-05 (×5): 25 mg via ORAL
  Filled 2016-01-04 (×5): qty 1

## 2016-01-04 MED ORDER — LOPERAMIDE HCL 2 MG PO CAPS: 2.0000 mg | ORAL_CAPSULE | ORAL | Status: AC | PRN

## 2016-01-04 MED ORDER — POTASSIUM CHLORIDE CRYS ER 20 MEQ PO TBCR
60.0000 meq | EXTENDED_RELEASE_TABLET | Freq: Once | ORAL | Status: DC
  Filled 2016-01-04: qty 3

## 2016-01-04 MED ORDER — ADULT MULTIVITAMIN W/MINERALS CH
1.0000 | ORAL_TABLET | Freq: Every day | ORAL | Status: DC
  Administered 2016-01-05 – 2016-01-10 (×6): 1 via ORAL
  Filled 2016-01-04 (×10): qty 1

## 2016-01-04 MED ORDER — CYCLOBENZAPRINE HCL 10 MG PO TABS
10.0000 mg | ORAL_TABLET | Freq: Three times a day (TID) | ORAL | Status: DC | PRN
  Administered 2016-01-04 – 2016-01-10 (×12): 10 mg via ORAL
  Filled 2016-01-04 (×12): qty 1

## 2016-01-04 MED ORDER — VITAMIN B-1 100 MG PO TABS
100.0000 mg | ORAL_TABLET | Freq: Every day | ORAL | Status: DC
  Administered 2016-01-05 – 2016-01-10 (×6): 100 mg via ORAL
  Filled 2016-01-04 (×9): qty 1

## 2016-01-04 MED ORDER — CHLORDIAZEPOXIDE HCL 25 MG PO CAPS
25.0000 mg | ORAL_CAPSULE | Freq: Three times a day (TID) | ORAL | Status: AC
  Administered 2016-01-06 (×3): 25 mg via ORAL
  Filled 2016-01-04 (×3): qty 1

## 2016-01-04 MED ORDER — ALUM & MAG HYDROXIDE-SIMETH 200-200-20 MG/5ML PO SUSP: 30.0000 mL | ORAL | Status: DC | PRN

## 2016-01-04 MED ORDER — TRAZODONE HCL 50 MG PO TABS: 50.0000 mg | ORAL_TABLET | Freq: Every evening | ORAL | Status: DC | PRN

## 2016-01-04 MED ORDER — CHLORDIAZEPOXIDE HCL 25 MG PO CAPS
25.0000 mg | ORAL_CAPSULE | Freq: Every day | ORAL | Status: AC
  Administered 2016-01-08: 25 mg via ORAL
  Filled 2016-01-04: qty 1

## 2016-01-04 MED ORDER — MAGNESIUM HYDROXIDE 400 MG/5ML PO SUSP: 30.0000 mL | Freq: Every day | ORAL | Status: DC | PRN

## 2016-01-04 NOTE — ED Notes (Signed)
Pt returned to Room, pt reported by CT to try to get up & was confused, pt brought back to the room &  Placed back on the monitor, pt to receive Benadryl prior to returning to CT
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
## 2016-01-04 NOTE — Progress Notes (Signed)
Patient meets criteria for IP psych treatment, per NP Elmarie Shiley. Patient is under review at: [facility]   CSW will continue to follow up with placement.  Verlon Setting, [place] Disposition staff 01/04/2016 5:45 PM

## 2016-01-04 NOTE — Progress Notes (Signed)
Patient accepted at [facility], to Dr. Sabra Heck, room 305-2, bed available at 20:00, call report at [phone]. RN Wells Guiles aware.  Verlon Setting, [place] Disposition staff 01/04/2016 6:24 PM
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
## 2016-01-04 NOTE — ED Notes (Signed)
Pt tranpsorted to CT with sitter at bedside. Pt in nad. Sleeping at this timem.

## 2016-01-04 NOTE — ED Notes (Signed)
Staffing informed of need for sitter

## 2016-01-04 NOTE — ED Notes (Addendum)
Pt belongings (clothing necklace and [place] ID)given to husband to take home.   Pt husband states this is the second time pt been seen for the same and states that he will not be coming to visit if pt asks about him. Husband given number to main ED to call for updates.

## 2016-01-04 NOTE — ED Notes (Signed)
Pt arrived to C24 via stretcher - pt ambulatory to bed w/assistance from staff x 1. Pt noted to be wearing hospital gown and IV to left forearm. Will assist pt w/changing into paper scrubs when more awake.
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
## 2016-01-04 NOTE — ED Notes (Signed)
Pt incontinent of urine will attempt I&O in 15 mins

## 2016-01-04 NOTE — ED Notes (Signed)
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 All 3 other served copies from [employer] on chart.

## 2016-01-04 NOTE — ED Notes (Signed)
Patient transported to CT 
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
## 2016-01-04 NOTE — ED Notes (Addendum)
While in CT pt was incontinent of urine, will in and out cath when fluids are done.

## 2016-01-04 NOTE — ED Notes (Addendum)
Pt HOB raised to 45 degrees and pt placed on heart monitor.

## 2016-01-04 NOTE — ED Notes (Signed)
Pt c/o generalized pain - offered pt pain meds - refused - states "No, just let me die." Offered pt an ice pack - refused - states "No just let me die." Pt turned herself over in bed and closed her eyes.

## 2016-01-04 NOTE — ED Notes (Signed)
Xray at bedside to perform dg chest, pt actively vomiting blue fluid up, pt mouth suctioned. Pt with strong cough. MD notified and at bedside.

## 2016-01-05 ENCOUNTER — Encounter (HOSPITAL_COMMUNITY): Payer: Self-pay | Admitting: Psychiatry

## 2016-01-05 DIAGNOSIS — F10229 Alcohol dependence with intoxication, unspecified: Secondary | ICD-10-CM | POA: Diagnosis present

## 2016-01-05 DIAGNOSIS — F411 Generalized anxiety disorder: Secondary | ICD-10-CM

## 2016-01-05 DIAGNOSIS — F332 Major depressive disorder, recurrent severe without psychotic features: Secondary | ICD-10-CM

## 2016-01-05 HISTORY — DX: Alcohol dependence with intoxication, unspecified: F10.229

## 2016-01-05 MED ORDER — IBUPROFEN 600 MG PO TABS
600.0000 mg | ORAL_TABLET | Freq: Four times a day (QID) | ORAL | Status: DC | PRN
  Administered 2016-01-05 – 2016-01-09 (×9): 600 mg via ORAL
  Filled 2016-01-05 (×9): qty 1

## 2016-01-05 NOTE — Progress Notes (Signed)
[name] has been asleep most of the evening. She did not attend group. States she is "just too tired" to get up and walk around. She rates anxiety 0/10 Depression and Hopelessness 7/10./ She denies SI/HI/AVH and contracts for safety. She states her goal tomorrow will be to try not to sleep as much. Encouragement and support given. Medications administered as prescribed. Continue to monitor Q 15 minutes for patient safety and medication effectiveness.
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
## 2016-01-05 NOTE — BHH Group Notes (Signed)
[facility] LCSW Aftercare Discharge Planning Group Note   01/05/2016 9:30 AM  Participation Quality:  Invited. DID NOT ATTEND. Pt chose to remain in bed.   Smart, Majestic Brister LCSW
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
## 2016-01-05 NOTE — Progress Notes (Signed)
Pt did not attend NA group meeting. Pt stayed in bed sleep.

## 2016-01-05 NOTE — BHH Suicide Risk Assessment (Signed)
[facility] Admission Suicide Risk Assessment   Nursing information obtained from:  Patient, Review of record Demographic factors:  Caucasian, Unemployed (unemployed as of today) Current Mental Status:  Self-harm thoughts Loss Factors:  Decrease in vocational status, Legal issues, Financial problems / change in socioeconomic status Historical Factors:  Prior suicide attempts Risk Reduction Factors:  Responsible for children under 63 years of age, Religious beliefs about death, Living with another person, especially a relative, Positive social support  Total Time spent with patient: 45 minutes Principal Problem: Alcohol dependence with acute alcoholic intoxication with complication ([place]) Diagnosis:   Patient Active Problem List   Diagnosis Date Noted  . Alcohol dependence with acute alcoholic intoxication with complication ([place]) 0000000 01/05/2016  . MDD (major depressive disorder) ([place]) [F32.9] 10/07/2015  . 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 GAD (generalized anxiety disorder) [F41.1]   . 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  World Pharmacologist [facility]). Score between 0-7:  no or low risk or alcohol related problems. Score between 8-15:  moderate risk of alcohol related problems. Score between 16-19:  high risk of alcohol related problems. Score 20 or above:  warrants further diagnostic evaluation for alcohol dependence and treatment.   CLINICAL FACTORS:   Depression:   Comorbid alcohol abuse/dependence Alcohol/Substance Abuse/Dependencies   Psychiatric Specialty Exam: ROS  Blood pressure 119/63, pulse 77, temperature 98.6 F (37 C), temperature source Oral,  resp. rate 16, height 5\' 6"  (1.676 m), weight 72.576 kg (160 lb).Body mass index is 25.84 kg/(m^2).  COGNITIVE FEATURES THAT CONTRIBUTE TO RISK:  Closed-mindedness, Polarized thinking and Thought constriction (tunnel vision)    SUICIDE RISK:   Moderate:  Frequent suicidal ideation with limited intensity, and duration, some specificity in terms of plans, no associated intent, good self-control, limited dysphoria/symptomatology, some risk factors present, and identifiable protective factors, including available and accessible social support.  PLAN OF CARE: See Admission H and P  I certify that inpatient services furnished can reasonably be expected to improve the patient's condition.   Nicholaus Bloom, MD 01/05/2016, 4:29 PM

## 2016-01-05 NOTE — Tx Team (Signed)
Initial Interdisciplinary Treatment Plan   PATIENT STRESSORS: Financial difficulties Legal issue Medication change or noncompliance Occupational concerns Substance abuse   PATIENT STRENGTHS: Ability for insight Average or above average intelligence Capable of independent living General fund of knowledge Religious Affiliation Supportive family/friends   PROBLEM LIST: Problem List/Patient Goals Date to be addressed Date deferred Reason deferred Estimated date of resolution  Loss of job Engineer, materials license a couple of years ago      Depression      Risk for self harm      Alcohol abuse      Financial distress-will probably have to file bankruptsy            "I really don't know right now what I want to work on"      "I'm tired of feeling hopeless"             DISCHARGE CRITERIA:  Ability to meet basic life and health needs Improved stabilization in mood, thinking, and/or behavior Medical problems require only outpatient monitoring Motivation to continue treatment in a less acute level of care Verbal commitment to aftercare and medication compliance Withdrawal symptoms are absent or subacute and managed without 24-hour nursing intervention  PRELIMINARY DISCHARGE PLAN: Attend aftercare/continuing care group Outpatient therapy Return to previous living arrangement  PATIENT/FAMIILY INVOLVEMENT: This treatment plan has been presented to and reviewed with the patient, [name], and/or family member.  The patient and family have been given the opportunity to ask questions and make suggestions.  Junius Finner [facility] 01/05/2016, 12:17 AM

## 2016-01-05 NOTE — Progress Notes (Signed)
[facility] Group Notes:  (Nursing/MHT/Case Management/Adjunct)  Date:  01/05/2016  Time:  7:28 PM  Type of Therapy:  Psychoeducational Skills    Summary of Progress/Problems: Pt did not group for personal development. Pt was encouraged to attend group.   Abe People Brittini 01/05/2016, 7:28 PM

## 2016-01-05 NOTE — Progress Notes (Addendum)
D:Pt forwards little with this nurse. Pt presents with depressed affect. In bed this shift. Sleeping majority of the shift. Reports she is tired. C/O generalized pain. Pt denies SI/HI. Denies AVH.  A:Special checks q 15 mins in place for safety. Medication administered per MD order (see eMAR). Pt encouraged to get OOB. Pt encouraged to shower. Shower supplies provided.   R: Pt refusing to get OOB, Reports she just wants to rest at this time. Safety mantained. Compliant with medication regimen. Will continue to monitor.

## 2016-01-05 NOTE — Progress Notes (Signed)
Pt signed voluntary admission and consent for treatment on 01/05/16 @ 1130. Placed on front of chart.

## 2016-01-06 MED ORDER — VILAZODONE HCL 20 MG PO TABS
20.0000 mg | ORAL_TABLET | Freq: Every day | ORAL | Status: DC
  Administered 2016-01-06 – 2016-01-07 (×2): 20 mg via ORAL
  Filled 2016-01-06 (×4): qty 1

## 2016-01-06 NOTE — Plan of Care (Signed)
Problem: Consults Goal: Depression Patient Education See Patient Education Module for education specifics.  Outcome: Progressing Nurse discussed depression/coping skills with patient.        

## 2016-01-06 NOTE — BHH Group Notes (Signed)
The focus of this group is to educate the patient on the purpose and policies of crisis stabilization and provide a format to answer questions about their admission.  The group details unit policies and expectations of patients while admitted.  Patient did not attend 0900 nurse education orientation group this morning.  Patient stayed in bed.   

## 2016-01-06 NOTE — Progress Notes (Signed)
Patient did not attend the evening karaoke group. Pt remained in bed sleeping during group time.

## 2016-01-06 NOTE — Progress Notes (Addendum)
D:  Patient's self inventory sheet, patient has poor sleep, no sleep medication.  Poor appetite, low energy level, good concentration.  Rated depression 8, hopeless 10, denied anxiety.  Withdrawals, cramping, nausea, runny nose.  Denied SI.  Worst pain in past 24 hours is #8, chest pain.  Pain medication is helpful.  Goal is to sleep.  No discharge plans.   A:  Medications administered per MD orders.  Emotional support and encouragement given patient. R:   Denied SI and HI, contracts for safety.  Denied A/V hallucinations.  Safety maintained with 15 minute checks.

## 2016-01-06 NOTE — Progress Notes (Signed)
[facility] MD Progress Note  01/06/2016 2:30 PM [name]  MRN:  [id number] Subjective:  [name] is still having a hard time. She has not been able to communicate with her husband and does not know what is going on. States she does not know if she is going to be charged with a DWI or not. She state she is nauseated, not feeling well. The chest pain is a little better. Still very depressed very distraught  Principal Problem: Alcohol dependence with acute alcoholic intoxication with complication [facility]) Diagnosis:   Patient Active Problem List   Diagnosis Date Noted  . Alcohol dependence with acute alcoholic intoxication with complication ([place]) 0000000 01/05/2016  . MDD (major depressive disorder) ([place]) [F32.9] 10/07/2015  . 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 GAD (generalized anxiety disorder) [F41.1]   . 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 Hypertension   . ADHD (attention deficit hyperactivity disorder)   . Anxiety   . SUI (stress urinary incontinence, female)   . History of anemia   . GERD (gastroesophageal reflux disease)   . Cutaneous lupus erythematosus   . Cancer ([place])     bladder  . Anemia     Past Surgical History  Procedure Laterality Date  . Cesarean section  12-23-2004     W/ LEFT TUBAL LIGATION AND RIGHT SALPINGECTOMY  . Cesarean section  1995  . Bladder suspension N/A 02/13/2013    Procedure: [facility];  Surgeon: Malka So, MD;  Location: [facility];  Service: Urology;  Laterality: N/A;  . Tubal ligation    . Orif proximal humerus fracture Right 04/28/2014    DR CHANDLER  . Orif humerus fracture Right 04/28/2014    Procedure: OPEN REDUCTION INTERNAL FIXATION (ORIF) PROXIMAL HUMERUS FRACTURE;  Surgeon: Nita Sells, MD;  Location: [place];  Service: Orthopedics;  Laterality: Right;   Family History: History reviewed. No pertinent family history. Family Psychiatric  History: see admission H and P Social History:  History  Alcohol Use  . Yes    Comment: was in recovery for 4 months.      History  Drug Use No    Social History   Social History  . Marital Status: Married    Spouse Name: N/A  . Number of Children: N/A  . Years of Education: N/A   Social History Main Topics  . Smoking status: Current Every Day Smoker -- 0.25 packs/day for 20 years    Types: Cigarettes  . Smokeless tobacco: Never Used  . 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   . Drug Use: No  . Sexual Activity: Not Currently    Birth Control/ Protection: Post-menopausal   Other Topics Concern  . None   Social History Narrative   Additional Social History:    Pain Medications: See home med list Prescriptions: See home med list Over the Counter: See home med list History of alcohol / drug use?: Yes Longest period of sobriety (when/how long): 6 months Negative Consequences of Use: Financial, Legal, Personal relationships, Work / School Name of Substance 1: alcohol 1 - Age of First Use: 30's 1 - Amount (size/oz): varies 1 - Frequency: sporadic 1 - Duration: ongoing 1 - Last Use /  Amount: 01/04/16                  Sleep: Fair  Appetite:  Poor  Current Medications: Current Facility-Administered Medications  Medication Dose Route Frequency Provider Last Rate Last Dose  . alum & mag hydroxide-simeth (MAALOX/MYLANTA) 200-200-20 MG/5ML suspension 30 mL  30 mL Oral Q4H PRN Laverle Hobby, PA-C      . chlordiazePOXIDE (LIBRIUM) capsule 25 mg  25 mg Oral Q6H PRN Laverle Hobby, PA-C      . chlordiazePOXIDE (LIBRIUM) capsule 25 mg  25 mg Oral TID Laverle Hobby, PA-C   25 mg at 01/06/16 1216   Followed by  . [START ON 01/07/2016] chlordiazePOXIDE (LIBRIUM) capsule 25 mg  25  mg Oral BH-qamhs Spencer E Simon, PA-C       Followed by  . [START ON 01/08/2016] chlordiazePOXIDE (LIBRIUM) capsule 25 mg  25 mg Oral Daily Laverle Hobby, PA-C      . cyclobenzaprine (FLEXERIL) tablet 10 mg  10 mg Oral TID PRN Laverle Hobby, PA-C   10 mg at 01/06/16 0813  . hydrOXYzine (ATARAX/VISTARIL) tablet 25 mg  25 mg Oral Q6H PRN Laverle Hobby, PA-C   25 mg at 01/06/16 1408  . ibuprofen (ADVIL,MOTRIN) tablet 600 mg  600 mg Oral Q6H PRN Nicholaus Bloom, MD   600 mg at 01/06/16 0813  . loperamide (IMODIUM) capsule 2-4 mg  2-4 mg Oral PRN Laverle Hobby, PA-C      . magnesium hydroxide (MILK OF MAGNESIA) suspension 30 mL  30 mL Oral Daily PRN Laverle Hobby, PA-C      . multivitamin with minerals tablet 1 tablet  1 tablet Oral Daily Laverle Hobby, PA-C   1 tablet at 01/06/16 0809  . nicotine polacrilex (NICORETTE) gum 2 mg  2 mg Oral PRN Laverle Hobby, PA-C      . ondansetron (ZOFRAN-ODT) disintegrating tablet 4 mg  4 mg Oral Q6H PRN Laverle Hobby, PA-C   4 mg at 01/06/16 1408  . thiamine (B-1) injection 100 mg  100 mg Intramuscular Once Laverle Hobby, PA-C   100 mg at 01/04/16 2300  . thiamine (VITAMIN B-1) tablet 100 mg  100 mg Oral Daily Laverle Hobby, PA-C   100 mg at 01/06/16 0809  . traZODone (DESYREL) tablet 50 mg  50 mg Oral QHS PRN Laverle Hobby, PA-C   50 mg at 01/04/16 2259    Lab Results: No results found for this or any previous visit (from the past 48 hour(s)).  Physical Findings: AIMS: Facial and Oral Movements Muscles of Facial Expression: None, normal Lips and Perioral Area: None, normal Jaw: None, normal Tongue: None, normal,Extremity Movements Upper (arms, wrists, hands, fingers): None, normal Lower (legs, knees, ankles, toes): None, normal, Trunk Movements Neck, shoulders, hips: None, normal, Overall Severity Severity of abnormal movements (highest score from questions above): None, normal Incapacitation due to abnormal movements: None,  normal Patient's awareness of abnormal movements (rate only patient's report): No Awareness, Dental Status Current problems with teeth and/or dentures?: No Does patient usually wear dentures?: No  CIWA:  CIWA-Ar Total: 1 COWS:  COWS Total Score: 2  Musculoskeletal: Strength & Muscle Tone: within normal limits Gait & Station: normal Patient leans: normal  Psychiatric Specialty Exam: Review of Systems  Constitutional: Positive for malaise/fatigue.  Eyes: Negative.   Respiratory: Negative.   Cardiovascular: Positive for chest pain.  Gastrointestinal: Negative.   Genitourinary: Negative.  Musculoskeletal: Positive for myalgias.  Skin: Negative.   Neurological: Positive for weakness and headaches.  Endo/Heme/Allergies: Negative.   Psychiatric/Behavioral: Positive for depression and substance abuse. The patient is nervous/anxious.     Blood pressure 104/75, pulse 82, temperature 98 F (36.7 C), temperature source Oral, resp. rate 16, height 5\' 6"  (1.676 m), weight 72.576 kg (160 lb).Body mass index is 25.84 kg/(m^2).  General Appearance: Disheveled  Eye Contact::  Minimal  Speech:  Clear and Coherent  Volume:  fluctuates  Mood:  Anxious, Depressed, Dysphoric, Hopeless and Worthless  Affect:  Depressed and Tearful  Thought Process:  Coherent and Goal Directed  Orientation:  Full (Time, Place, and Person)  Thought Content:  symptoms events worries concerns  Suicidal Thoughts:  No  Homicidal Thoughts:  No  Memory:  Immediate;   Fair Recent;   Fair Remote;   Fair  Judgement:  Fair  Insight:  Present  Psychomotor Activity:  Decreased  Concentration:  Fair  Recall:  Poor  Fund of Knowledge:Fair  Language: Fair  Akathisia:  No  Handed:  Right  AIMS (if indicated):     Assets:  Desire for Improvement Vocational/Educational  ADL's:  Intact  Cognition: WNL  Sleep:  Number of Hours: 6.75   Treatment Plan Summary: Daily contact with patient to assess and evaluate symptoms  and progress in treatment and Medication management Supportive approach/coping skills Alcohol dependence; continue the alcohol detox protocol/work a relapse prevention plan Depression; will resume her Viibryd at 20 mg and reassess for an increase to 30 as was planned by Dr. Silvio Pate. She wants to give Viibryd a good try as Lexapro had quit working for her. To notice is that during the last 2 weeks off Lexapro and on Viibryd she noticed more of the mood instability. Will monitor closely Work with CBT/mindfulness Will ask MSW to attempt communication with her husband and to clarify what is going on [place], MD 01/06/2016, 2:30 PM

## 2016-01-06 NOTE — BHH Group Notes (Signed)
[facility] LCSW Group Therapy 01/06/2016  1:15 PM   Type of Therapy: Group Therapy  Participation Level: Did Not Attend. Patient invited to participate but declined.   Tilden Fossa, MSW, [facility] Clinical Social Worker [facility] [phone]

## 2016-01-07 MED ORDER — VILAZODONE HCL 20 MG PO TABS
30.0000 mg | ORAL_TABLET | Freq: Every day | ORAL | Status: DC
  Administered 2016-01-08 – 2016-01-10 (×3): 30 mg via ORAL
  Filled 2016-01-07 (×5): qty 1.5

## 2016-01-07 NOTE — Progress Notes (Signed)
Psychoeducational Group Note  Date:  01/07/2016 Time:  2342  Group Topic/Focus:  Wrap-Up Group:   The focus of this group is to help patients review their daily goal of treatment and discuss progress on daily workbooks.  Participation Level: Did Not Attend  Participation Quality:  Not Applicable  Affect:  Not Applicable  Cognitive:  Not Applicable  Insight:  Not Applicable  Engagement in Group: Not Applicable  Additional Comments:  The patient did not attend group this evening.   Archie Balboa S 01/07/2016, 11:41 PM

## 2016-01-07 NOTE — Plan of Care (Signed)
Problem: Alteration in mood Goal: STG-Patient reports thoughts of self-harm to staff Outcome: Progressing Patient denies any SI to staff; Contracts to come to staff.

## 2016-01-07 NOTE — Progress Notes (Signed)
Recreation Therapy Notes  Date: 02.03.2017 Time: 9:30am Location: [address] Group Room   Group Topic: Stress Management  Goal Area(s) Addresses:  Patient will actively participate in stress management techniques presented during session.   Behavioral Response: Did not attend.   Laureen Ochs Reah Justo, LRT/CTRS        Lane Hacker 01/07/2016 3:36 PM

## 2016-01-07 NOTE — Progress Notes (Signed)
[facility] MD Progress Note  01/07/2016 2:31 PM [name]  MRN:  [id number] Subjective:  [name] continues to have a hard time. She still has not been able to remember what happened before she came here. She is worried about possible consequences i.e. Being charged with a DWI among others. She has not heard from her husband what has made her worry even more Principal Problem: Alcohol dependence with acute alcoholic intoxication with complication [facility]) Diagnosis:   Patient Active Problem List   Diagnosis Date Noted  . Alcohol dependence with acute alcoholic intoxication with complication ([place]) 0000000 01/05/2016  . MDD (major depressive disorder) ([place]) [F32.9] 10/07/2015  . 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 GAD (generalized anxiety disorder) [F41.1]   . 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 Hypertension   . ADHD (attention deficit hyperactivity disorder)   . Anxiety   . SUI (stress urinary incontinence, female)   . History of anemia   . GERD (gastroesophageal reflux disease)   . Cutaneous lupus erythematosus   . Cancer ([place])     bladder  . Anemia     Past Surgical History  Procedure Laterality Date  . Cesarean section  12-23-2004     W/ LEFT TUBAL LIGATION AND RIGHT SALPINGECTOMY  . Cesarean section  1995  . Bladder suspension N/A 02/13/2013    Procedure: [facility];  Surgeon: Malka So, MD;  Location: [facility];  Service: Urology;  Laterality: N/A;  . Tubal ligation    . Orif proximal humerus fracture Right 04/28/2014    DR CHANDLER  . Orif humerus fracture Right 04/28/2014    Procedure: OPEN REDUCTION INTERNAL FIXATION (ORIF) PROXIMAL HUMERUS FRACTURE;  Surgeon: Nita Sells, MD;  Location: [place];  Service:  Orthopedics;  Laterality: Right;   Family History: History reviewed. No pertinent family history. Family Psychiatric  History: see admission H and P Social History:  History  Alcohol Use  . Yes    Comment: was in recovery for 4 months.      History  Drug Use No    Social History   Social History  . Marital Status: Married    Spouse Name: N/A  . Number of Children: N/A  . Years of Education: N/A   Social History Main Topics  . Smoking status: Current Every Day Smoker -- 0.25 packs/day for 20 years    Types: Cigarettes  . Smokeless tobacco: Never Used  . 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   . Drug Use: No  . Sexual Activity: Not Currently    Birth Control/ Protection: Post-menopausal   Other Topics Concern  . None   Social History Narrative   Additional Social History:    Pain Medications: See home med list Prescriptions: See home med list Over the Counter: See home med list History of alcohol / drug use?: Yes Longest period of sobriety (when/how long): 6 months Negative Consequences of Use: Financial, Legal, Personal relationships, Work / School Name of Substance 1: alcohol 1 - Age of First Use: 30's 1 - Amount (size/oz): varies 1 - Frequency: sporadic 1 - Duration: ongoing 1 - Last Use / Amount: 01/04/16  Sleep: Fair  Appetite:  Poor  Current Medications: Current Facility-Administered Medications  Medication Dose Route Frequency Provider Last Rate Last Dose  . alum & mag hydroxide-simeth (MAALOX/MYLANTA) 200-200-20 MG/5ML suspension 30 mL  30 mL Oral Q4H PRN Laverle Hobby, PA-C      . chlordiazePOXIDE (LIBRIUM) capsule 25 mg  25 mg Oral Q6H PRN Laverle Hobby, PA-C      . chlordiazePOXIDE (LIBRIUM) capsule 25 mg  25 mg Oral BH-qamhs Spencer E Simon, PA-C   25 mg at 01/07/16 0804   Followed by  . [START ON 01/08/2016] chlordiazePOXIDE (LIBRIUM) capsule 25 mg  25 mg Oral Daily Laverle Hobby, PA-C      .  cyclobenzaprine (FLEXERIL) tablet 10 mg  10 mg Oral TID PRN Laverle Hobby, PA-C   10 mg at 01/07/16 1148  . hydrOXYzine (ATARAX/VISTARIL) tablet 25 mg  25 mg Oral Q6H PRN Laverle Hobby, PA-C   25 mg at 01/07/16 0331  . ibuprofen (ADVIL,MOTRIN) tablet 600 mg  600 mg Oral Q6H PRN Nicholaus Bloom, MD   600 mg at 01/07/16 1148  . loperamide (IMODIUM) capsule 2-4 mg  2-4 mg Oral PRN Laverle Hobby, PA-C      . magnesium hydroxide (MILK OF MAGNESIA) suspension 30 mL  30 mL Oral Daily PRN Laverle Hobby, PA-C      . multivitamin with minerals tablet 1 tablet  1 tablet Oral Daily Laverle Hobby, PA-C   1 tablet at 01/07/16 0804  . nicotine polacrilex (NICORETTE) gum 2 mg  2 mg Oral PRN Laverle Hobby, PA-C      . ondansetron (ZOFRAN-ODT) disintegrating tablet 4 mg  4 mg Oral Q6H PRN Laverle Hobby, PA-C   4 mg at 01/06/16 1408  . thiamine (B-1) injection 100 mg  100 mg Intramuscular Once Laverle Hobby, PA-C   100 mg at 01/04/16 2300  . thiamine (VITAMIN B-1) tablet 100 mg  100 mg Oral Daily Laverle Hobby, PA-C   100 mg at 01/07/16 [id number]  . traZODone (DESYREL) tablet 50 mg  50 mg Oral QHS PRN Laverle Hobby, PA-C   50 mg at 01/04/16 2259  . Vilazodone HCl TABS 20 mg  20 mg Oral Q breakfast Nicholaus Bloom, MD   20 mg at 01/07/16 [id number]    Lab Results: No results found for this or any previous visit (from the past 12 hour(s)).  Physical Findings: AIMS: Facial and Oral Movements Muscles of Facial Expression: None, normal Lips and Perioral Area: None, normal Jaw: None, normal Tongue: None, normal,Extremity Movements Upper (arms, wrists, hands, fingers): None, normal Lower (legs, knees, ankles, toes): None, normal, Trunk Movements Neck, shoulders, hips: None, normal, Overall Severity Severity of abnormal movements (highest score from questions above): None, normal Incapacitation due to abnormal movements: None, normal Patient's awareness of abnormal movements (rate only patient's report): No  Awareness, Dental Status Current problems with teeth and/or dentures?: No Does patient usually wear dentures?: No  CIWA:  CIWA-Ar Total: 1 COWS:  COWS Total Score: 2  Musculoskeletal: Strength & Muscle Tone: within normal limits Gait & Station: normal Patient leans: normal  Psychiatric Specialty Exam: Review of Systems  Constitutional: Positive for malaise/fatigue.  Eyes: Negative.   Respiratory: Negative.   Cardiovascular: Positive for chest pain.  Gastrointestinal: Positive for nausea.  Genitourinary: Negative.   Musculoskeletal: Negative.   Skin: Negative.   Neurological: Positive for weakness and headaches.  Endo/Heme/Allergies: Negative.   Psychiatric/Behavioral: Positive  for depression and substance abuse. The patient is nervous/anxious.     Blood pressure 105/72, pulse 87, temperature 97.5 F (36.4 C), temperature source Oral, resp. rate 16, height 5\' 6"  (1.676 m), weight 72.576 kg (160 lb).Body mass index is 25.84 kg/(m^2).  General Appearance: Disheveled  Eye Sport and exercise psychologist::  Fair  Speech:  Clear and Coherent and Slow  Volume:  Decreased  Mood:  Anxious and Depressed  Affect:  anxious depressed worried  Thought Process:  Coherent and Goal Directed  Orientation:  Full (Time, Place, and Person)  Thought Content:  symptoms events worries concerns  Suicidal Thoughts:  No  Homicidal Thoughts:  No  Memory:  Immediate;   Fair Recent;   Fair Remote;   Fair  Judgement:  Fair  Insight:  Present and shallow  Psychomotor Activity:  Decreased  Concentration:  Fair  Recall:  [employer] of Knowledge:Fair  Language: Fair  Akathisia:  No  Handed:  Right  AIMS (if indicated):     Assets:  Desire for Improvement  ADL's:  Intact  Cognition: WNL  Sleep:  Number of Hours: 5   Treatment Plan Summary: Daily contact with patient to assess and evaluate symptoms and progress in treatment and Medication management Supportive approach/coping skills Alcohol dependence; continue the  Librium detox protocol/work a relapse prevention plan Depression; continue the Vilazodone increase to 30 mg Sleep; continue the Trazodone 50 mg Note; a friend called to inform that Kimika's  husband is going to leave her after this last event  and that she wants to be a support for her. Teva is going to call the friend and get more information. The information given by her friend was not shared with [name] Work with CBT/mindfulness/problem solving/stress management Explore residential treatment options  Maurio Baize A, MD 01/07/2016, 2:31 PM

## 2016-01-07 NOTE — Progress Notes (Signed)
D. Pt has been in room and in bed for the duration of the evening. Pt, easily aroused, spoke about how she was feeling tired this evening and wanted to get some sleep as she has not been sleeping well. Pt did appear depressed and withdrawn this evening, and did freely drink fluids brought for her. A. Support provided. R. Safety maintained, will continue to monitor.
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
## 2016-01-07 NOTE — BHH Group Notes (Signed)
[place] LCSW Group Therapy  01/07/2016 3:35 PM  Type of Therapy:  Group Therapy  Participation Level:  Did Not Attend-pt invited. Chose to remain in bed.   Summary of Progress/Problems: Feelings around Relapse. Group members discussed the meaning of relapse and shared personal stories of relapse, how it affected them and others, and how they perceived themselves during this time. Group members were encouraged to identify triggers, warning signs and coping skills used when facing the possibility of relapse. Social supports were discussed and explored in detail. Post Acute Withdrawal Syndrome (handout provided) was introduced and examined. Pt's were encouraged to ask questions, talk about key points associated with PAWS, and process this information in terms of relapse prevention.   Smart, Manaal Mandala LCSW 01/07/2016, 3:35 PM

## 2016-01-07 NOTE — BHH Group Notes (Signed)
[facility] LCSW Aftercare Discharge Planning Group Note   01/07/2016 9:35 AM  Participation Quality:  Invited. DID NOT ATTEND> pt chose to remain in bed.   Smart, Tessy Pawelski LCSW

## 2016-01-08 DIAGNOSIS — F1014 Alcohol abuse with alcohol-induced mood disorder: Secondary | ICD-10-CM

## 2016-01-08 NOTE — BHH Group Notes (Signed)
[facility] Group Notes: (Clinical Social Work)   01/08/2016      Type of Therapy:  Group Therapy   Participation Level:  Did Not Attend despite MHT prompting   Selmer Dominion, LCSW 01/08/2016, 12:48 PM

## 2016-01-08 NOTE — Progress Notes (Signed)
D. Pt had been up visible in milieu, minimal interaction or participation in milieu. Pt reports on-going depression but able to contract for safety on the unit. Pt endorsed on-going pain and spoke about how she is just not feeling well and spoke about how she was hopeful for a good nights rest this evening. Pt did receive all medications without incident this evening. A. Support and encouragement provided. R. Safety maintained, will continue to monitor.
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
## 2016-01-08 NOTE — BHH Group Notes (Signed)
[place] Group Notes:  (Nursing/MHT/Case Management/Adjunct)  Date:  01/08/2016  Time:  0845 am  Type of Therapy:  Psychoeducational Skills  Participation Level:  Minimal  Participation Quality:  Resistant  Affect:  Appropriate  Cognitive:  Alert  Insight:  Lacking  Engagement in Group:  Resistant  Modes of Intervention:  Support  Summary of Progress/Problems:  Zipporah Plants 01/08/2016, 9:16 AM

## 2016-01-08 NOTE — Progress Notes (Signed)
D:Per patient self inventory form pt reports she slept fair last night with the use of sleep medication. Pt reports a poor appetite, low energy level, poor concentration. Pt rates depression 10/10, hopelessness 10/10, anxiety 6/10 all on 0-10 scale, 10 being the worse. Pt denies S/S of withdrawal. Pt denies SI/HI. Denies AVH. Pt reports her goal is ""try to attend some groups" Pt presents with depressed affect.  A:Special checks q 15 mins in place for safety. Medication administered per MD order (See eMAR) Encouragement and support provided.   R:safety maintained. Compliant with medication regimen. Will continue to monitor.

## 2016-01-08 NOTE — Progress Notes (Signed)
Patient did attend the evening speaker AA meeting.  

## 2016-01-09 DIAGNOSIS — F332 Major depressive disorder, recurrent severe without psychotic features: Secondary | ICD-10-CM | POA: Insufficient documentation
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
## 2016-01-09 NOTE — BHH Group Notes (Signed)
[place] Group Notes:  Healthy coping skills  Date:  01/09/2016  Time:  1300  Type of Therapy:  Nurse Education  Participation Level:  Did Not Attend  Participation Quality:  Inattentive  Affect:  Blunted  Cognitive:  Lacking  Insight:  None  Engagement in Group:  None  Modes of Intervention:  Discussion  Summary of Progress/Problems:Pt did not attend group  Marcello Moores [facility] 01/09/2016, 2:17 PM
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
## 2016-01-09 NOTE — [facility] Group Notes (Signed)
[facility] Group Notes:  (Clinical Social Work)  01/09/2016  10:00-11:00AM  Summary of Progress/Problems:   The main focus of today's process group was to   1)  Talk about 4 definitions of support  2)  Discuss how sometimes the support we want from someone is not possible, but that does not mean those people do not support [place] in another way  3)  Define health supports versus unhealthy supports  4)  Discuss the importance of adding healthy supports and deciding how to deal with unhealthy supports  The patient was only present for the first 5 minutes of group to hear a song, and then broke into tears when asked a question and left the room quickly.  Type of Therapy:  Process Group with Motivational Interviewing  Participation Level:  None  Participation Quality:  Resistant  Affect:  Tearful  Cognitive:  Unable to assess  Insight:  Poor  Engagement in Therapy:  None  Modes of Intervention:   Education, Support and Processing, Activity  Selmer Dominion, LCSW 01/09/2016

## 2016-01-09 NOTE — Progress Notes (Signed)
D: Patient observed in room visiting with friend at beginning of shift. Patient has been reclusive to room most of evening. Patient reading magazines. Patient did engage in conversation with this Probation officer. She states her goal for the day was to " attend group and figure out where her depression was coming from. " Patient states she did go to group during the day.  A: Support and encouragement offered. Q 15 minute checks in progress and maintained.  R: Patient remains safe on unit and monitoring continues.

## 2016-01-10 LAB — TSH: TSH: 2.337 u[IU]/mL (ref 0.350–4.500)

## 2016-01-10 MED ORDER — VILAZODONE HCL 20 MG PO TABS: 30.0000 mg | ORAL_TABLET | Freq: Every day | ORAL | Status: DC

## 2016-01-10 MED ORDER — TRAZODONE HCL 50 MG PO TABS: 50.0000 mg | ORAL_TABLET | Freq: Every evening | ORAL | Status: DC | PRN

## 2016-01-10 MED ORDER — ADULT MULTIVITAMIN W/MINERALS CH: 1.0000 | ORAL_TABLET | Freq: Every day | ORAL | Status: DC

## 2016-01-10 MED ORDER — NICOTINE POLACRILEX 2 MG MT GUM: 2.0000 mg | CHEWING_GUM | OROMUCOSAL | Status: DC | PRN

## 2016-01-10 MED ORDER — HYDROXYZINE HCL 25 MG PO TABS: 25.0000 mg | ORAL_TABLET | Freq: Four times a day (QID) | ORAL | Status: DC | PRN

## 2016-01-10 NOTE — B[facility] Suicide Risk Assessment (Signed)
[facility] Discharge Suicide Risk Assessment   Principal Problem: Alcohol dependence with acute alcoholic intoxication with complication [facility]) Discharge Diagnoses:  Patient Active Problem List   Diagnosis Date Noted  . Severe episode of recurrent major depressive disorder, without psychotic features ([place]) [F33.2]   . Alcohol dependence with acute alcoholic intoxication with complication ([place]) 0000000 01/05/2016  . MDD (major depressive disorder) ([place]) [F32.9] 10/07/2015  . 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 GAD (generalized anxiety disorder) [F41.1]   . Fracture of proximal humerus with nonunion [S42.209K] 04/28/2014    Total Time spent with patient: 20 minutes  Musculoskeletal: Strength & Muscle Tone: within normal limits Gait & Station: normal Patient leans: normal  Psychiatric Specialty Exam: Review of Systems  Constitutional: Negative.   HENT: Negative.   Eyes: Negative.   Respiratory: Negative.   Cardiovascular: Negative.   Gastrointestinal: Negative.   Genitourinary: Negative.   Musculoskeletal: Negative.   Skin: Negative.   Neurological: Negative.   Endo/Heme/Allergies: Negative.   Psychiatric/Behavioral: Positive for depression and substance abuse.    Blood pressure 114/82, pulse 87, temperature 98 F (36.7 C), temperature source Oral, resp. rate 16, height 5\' 6"  (1.676 m), weight 72.576 kg (160 lb), SpO2 94 %.Body mass index is 25.84 kg/(m^2).  General Appearance: Fairly Groomed  Engineer, water::  Fair  Speech:  Clear and [id number]  Volume:  Normal  Mood:  Euthymic  Affect:  Appropriate  Thought Process:  Coherent and Goal Directed  Orientation:  Full (Time, Place, and Person)  Thought Content:  plans as she moves on, relapse prevention plan  Suicidal Thoughts:  No  Homicidal Thoughts:  No  Memory:  Immediate;   Fair Recent;   Fair Remote;   Fair  Judgement:  Fair  Insight:  Present  Psychomotor  Activity:  Normal  Concentration:  Fair  Recall:  [employer] of [place]  Language: Fair  Akathisia:  No  Handed:  Right  AIMS (if indicated):     Assets:  Desire for Improvement Housing Social Support Vocational/Educational  Sleep:  Number of Hours: 5.75  Cognition: WNL  ADL's:  Intact  In full contact with reality. There are no active SI plans or intent. There are no active S/S of withdrawal. She is going home, understands her husband is upset with her and she is wanting to get things better between them but she is also ready to accept if he was to say that the relationship is over. She is not going to be charged with a DWI. She is relieved. She is going for training to start another job Thursday. She is insightful in that she had quit going to meetings that she allowed herself to get down after her last client did not need of her services anymore and did no seek out support. She has a best friend that will be there for her Mental Status Per Nursing Assessment::   On Admission:  Self-harm thoughts  Demographic Factors:  Caucasian  Loss Factors: none identified  Historical Factors: NA  Risk Reduction Factors:   Sense of responsibility to family, Employed and Living with another person, especially a relative  Continued Clinical Symptoms:  Depression:   Comorbid alcohol abuse/dependence Alcohol/Substance Abuse/Dependencies  Cognitive Features That Contribute To Risk:  None    Suicide Risk:  Minimal: No identifiable suicidal ideation.  Patients presenting with no risk factors but with morbid ruminations; may be classified as minimal risk based on the severity of the depressive symptoms  Follow-up Information    Follow up with [facility] On 01/12/2016.   Why:  Appt on this date at 12:30AM with Dr. Silvio Pate for medication management.    Contact information:   [address] [place], [place] [address] Phone: [phone] Fax: [phone]       Follow up with [facility] On 01/18/2016.   Why:  Appt on this date at 3:00PM with Beth for counseling.    Contact information:   [address] [place], [place] [address] Phone: [phone] Fax: [phone]      Plan Of Care/Follow-up recommendations:  Activity:  as tolerated Diet:  regular Follow up as above and AA Khira Cudmore A, MD 01/10/2016, 12:26 PM
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
## 2016-01-10 NOTE — Progress Notes (Signed)
Patient did attend the evening speaker AA meeting.  
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
## 2016-01-10 NOTE — BHH Suicide Risk Assessment (Signed)
[facility] INPATIENT:  Family/Significant Other Suicide Prevention Education  Suicide Prevention Education:  Education Completed; [name] (pt's husband) [phone] has been identified by the patient as the family member/significant other with whom the patient will be residing, and identified as the person(s) who will aid the patient in the event of a mental health crisis (suicidal ideations/suicide attempt).  With written consent from the patient, the family member/significant other has been provided the following suicide prevention education, prior to the and/or following the discharge of the patient.  The suicide prevention education provided includes the following:  Suicide risk factors  Suicide prevention and interventions  National Suicide Hotline telephone number  [facility] assessment telephone number  [facility] Emergency Assistance [place] and/or Residential Mobile Crisis Unit telephone number  Request made of family/significant other to:  Remove weapons (e.g., guns, rifles, knives), all items previously/currently identified as safety concern.    Remove drugs/medications (over-the-counter, prescriptions, illicit drugs), all items previously/currently identified as a safety concern.  The family member/significant other verbalizes understanding of the suicide prevention education information provided.  The family member/significant other agrees to remove the items of safety concern listed above.  Smart, Breda Bond LCSW 01/10/2016, 10:21 AM

## 2016-01-10 NOTE — Progress Notes (Signed)
  [facility] Adult Case Management Discharge Plan :  Will you be returning to the same living situation after discharge:  Yes,  home with husband At discharge, do you have transportation home?: Yes,  husband will pick her up this afternoon.  Do you have the ability to pay for your medications: Yes,  [employer] insurance  Release of information consent forms completed and submitted to medical Records by CSW.  Patient to Follow up at: Follow-up Information    Follow up with [facility] On 01/12/2016.   Why:  Appt on this date at 12:30AM with Dr. Silvio Pate for medication management.    Contact information:   [address] [place], [place] [address] Phone: [phone] Fax: [phone]      Follow up with [facility] On 01/18/2016.   Why:  Appt on this date at 3:00PM with Beth for counseling.    Contact information:   [address] [place], [place] [address] Phone: [phone] Fax: [phone]      Next level of care provider has access to [place] and Suicide Prevention discussed: Yes,  SPE completed with pt's husband, Dominica Severin.   Have you used any form of tobacco in the last 30 days? (Cigarettes, Smokeless Tobacco, Cigars, and/or Pipes): Yes  Has patient been referred to the Quitline?: Patient refused referral  Patient has been referred for addiction treatment: Yes  Smart, Shylynn Bruning LCSW 01/10/2016, 10:37 AM

## 2016-01-10 NOTE — Progress Notes (Signed)
DAR NOTE: Pt present with flat affect and depressed mood in the unit. Pt has been observed in the dayroom watching TV with peers. Pt denies physical pain, took all her meds as scheduled. As per self inventory, pt had a poor night sleep, fair appetite, low energy, and good concentration. Pt rate depression at 0, hopeless ness at 0, and anxiety at 5. Pt goal for today is "working on my goal to feel better emotionally. Pt's safety ensured with 15 minute and environmental checks. Pt currently denies SI/HI and A/V hallucinations. Pt verbally agrees to seek staff if SI/HI or A/VH occurs and to consult with staff before acting on these thoughts. Will continue POC.

## 2016-01-10 NOTE — Progress Notes (Signed)
Pt discharged home with her husband. Pt was ambulatory, stable and appreciative at that time. All papers and prescriptions were given and valuables returned. Verbal understanding expressed. Denies SI/HI and A/VH. Pt given opportunity to express concerns and ask questions.  

## 2016-11-23 ENCOUNTER — Emergency Department (HOSPITAL_COMMUNITY): Payer: PRIVATE HEALTH INSURANCE

## 2016-11-23 ENCOUNTER — Encounter (HOSPITAL_COMMUNITY): Payer: Self-pay

## 2016-11-23 ENCOUNTER — Emergency Department (HOSPITAL_COMMUNITY)
Admission: EM | Admit: 2016-11-23 | Discharge: 2016-11-23 | Disposition: A | Discharge status: Home / Self Care | Payer: 59 | Attending: Emergency Medicine | Admitting: Emergency Medicine

## 2016-11-23 DIAGNOSIS — F10921 Alcohol use, unspecified with intoxication delirium: Secondary | ICD-10-CM

## 2016-11-23 DIAGNOSIS — F909 Attention-deficit hyperactivity disorder, unspecified type: Secondary | ICD-10-CM | POA: Insufficient documentation

## 2016-11-23 DIAGNOSIS — F10220 Alcohol dependence with intoxication, uncomplicated: Secondary | ICD-10-CM

## 2016-11-23 DIAGNOSIS — F10221 Alcohol dependence with intoxication delirium: Secondary | ICD-10-CM | POA: Insufficient documentation

## 2016-11-23 DIAGNOSIS — I10 Essential (primary) hypertension: Secondary | ICD-10-CM | POA: Insufficient documentation

## 2016-11-23 DIAGNOSIS — F10920 Alcohol use, unspecified with intoxication, uncomplicated: Secondary | ICD-10-CM

## 2016-11-23 DIAGNOSIS — I639 Cerebral infarction, unspecified: Secondary | ICD-10-CM

## 2016-11-23 DIAGNOSIS — F17210 Nicotine dependence, cigarettes, uncomplicated: Secondary | ICD-10-CM | POA: Insufficient documentation

## 2016-11-23 DIAGNOSIS — Z8551 Personal history of malignant neoplasm of bladder: Secondary | ICD-10-CM | POA: Insufficient documentation

## 2016-11-23 LAB — COMPREHENSIVE METABOLIC PANEL
ALBUMIN: 4.2 g/dL (ref 3.5–5.0)
ALK PHOS: 80 U/L (ref 38–126)
ALT: 18 U/L (ref 14–54)
ANION GAP: 9 (ref 5–15)
AST: 23 U/L (ref 15–41)
BUN: 8 mg/dL (ref 6–20)
CALCIUM: 8.9 mg/dL (ref 8.9–10.3)
CO2: 24 mmol/L (ref 22–32)
Chloride: 112 mmol/L — ABNORMAL HIGH (ref 101–111)
Creatinine, Ser: 0.76 mg/dL (ref 0.44–1.00)
GFR calc non Af Amer: >60 mL/min (ref >60)
GLUCOSE: 97 mg/dL (ref 65–99)
POTASSIUM: 4.1 mmol/L (ref 3.5–5.1)
SODIUM: 145 mmol/L (ref 135–145)
TOTAL PROTEIN: 7 g/dL (ref 6.5–8.1)
Total Bilirubin: 0.6 mg/dL (ref 0.3–1.2)

## 2016-11-23 LAB — CBC
HCT: 43.4 % (ref 36.0–46.0)
Hemoglobin: 14.2 g/dL (ref 12.0–15.0)
MCH: 31.1 pg (ref 26.0–34.0)
MCHC: 32.7 g/dL (ref 30.0–36.0)
MCV: 95.2 fL (ref 78.0–100.0)
Platelets: 202 K/uL (ref 150–400)
RBC: 4.56 MIL/uL (ref 3.87–5.11)
RDW: 13.2 % (ref 11.5–15.5)
WBC: 7.5 K/uL (ref 4.0–10.5)

## 2016-11-23 LAB — CBG MONITORING, ED: Glucose-Capillary: 88 mg/dL (ref 65–99)

## 2016-11-23 LAB — DIFFERENTIAL
BASOS PCT: 1 %
Basophils Absolute: 0.1 10*3/uL (ref 0.0–0.1)
EOS ABS: 0.4 10*3/uL (ref 0.0–0.7)
EOS PCT: 5 %
Lymphocytes Relative: 38 %
Lymphs Abs: 2.8 10*3/uL (ref 0.7–4.0)
MONO ABS: 0.4 10*3/uL (ref 0.1–1.0)
Monocytes Relative: 6 %
NEUTROS PCT: 50 %
Neutro Abs: 3.8 10*3/uL (ref 1.7–7.7)

## 2016-11-23 LAB — PROTIME-INR
INR: 0.91
PROTHROMBIN TIME: 12.3 s (ref 11.4–15.2)

## 2016-11-23 LAB — APTT: aPTT: 35 s (ref 24–36)

## 2016-11-23 LAB — ETHANOL: Alcohol, Ethyl (B): 395 mg/dL

## 2016-11-23 LAB — LITHIUM LEVEL

## 2016-11-23 MED ORDER — SODIUM CHLORIDE 0.9 % IV BOLUS (SEPSIS)
1000.0000 mL | Freq: Once | INTRAVENOUS | Status: AC
  Administered 2016-11-23: 1000 mL via INTRAVENOUS

## 2016-11-23 MED ORDER — LORAZEPAM 2 MG/ML IJ SOLN
1.0000 mg | Freq: Once | INTRAMUSCULAR | Status: AC
  Administered 2016-11-23: 1 mg via INTRAVENOUS
  Filled 2016-11-23: qty 1

## 2016-11-23 MED ORDER — LORAZEPAM 2 MG/ML IJ SOLN
1.0000 mg | Freq: Once | INTRAMUSCULAR | Status: AC
  Administered 2016-11-23: 1 mg via INTRAVENOUS

## 2016-11-23 MED ORDER — LORAZEPAM 2 MG/ML IJ SOLN
INTRAMUSCULAR | Status: AC
  Filled 2016-11-23: qty 1

## 2016-11-23 NOTE — ED Notes (Signed)
Dr. Theora Gianotti and Dr. Leonel Ramsay notified that ethanol level resulted at 395.

## 2016-11-23 NOTE — ED Notes (Signed)
Patient Alert and oriented X4. Stable and ambulatory. Patient verbalized understanding of the discharge instructions.  Patient belongings were taken by the patient.  

## 2016-11-23 NOTE — Consult Note (Signed)
Neurology Consultation Reason for Consult: Altered mental status Referring Physician: Maryan Rued, W  CC: Altered mental status  History is obtained from: Patient, employer  HPI: [name] is a 50 y.o. female who was last seen normal around noon. Over the course of the next half-hour, she became markedly unsteady and then progress to having difficulty with her speech. EMS was called in because of these findings and the rapid onset she a code stroke was called.  On arrival, she was very tearful, markedly ataxic, denied any alcohol or substance use.  She was taken for a stat CT scan which was negative, and after return from CT her alcohol level came back at 395.   LKW: Noon tpa given?: no, not a stroke    ROS:  Unable to obtain due to altered mental status.   Past Medical History:  Diagnosis Date  . ADHD (attention deficit hyperactivity disorder)   . Anemia   . Anxiety   . Cancer ([place])    bladder  . Cutaneous lupus erythematosus   . GERD (gastroesophageal reflux disease)   . History of anemia   . Hypertension   . SUI (stress urinary incontinence, female)      Family history: Not able to obtain due to altered mental status   Social History:  reports that she has been smoking Cigarettes.  She has a 5.00 pack-year smoking history. She has never used smokeless tobacco. She reports that she drinks alcohol. She reports that she does not use drugs.   Exam: Current vital signs: BP 111/73   Pulse 78   Temp 98 F (36.7 C) (Oral)   Resp 15   Ht 5\' 4"  (1.626 m)   Wt 75.5 kg (166 lb 7.2 oz)   SpO2 100%   BMI 28.57 kg/m  Vital signs in last 24 hours: Temp:  [98 F (36.7 C)] 98 F (36.7 C) (12/21 1430) Pulse Rate:  [71-90] 78 (12/21 1630) Resp:  [13-17] 15 (12/21 1630) BP: (99-118)/(60-91) 111/73 (12/21 1630) SpO2:  [95 %-100 %] 100 % (12/21 1630) Weight:  [75.5 kg (166 lb 7.2 oz)] 75.5 kg (166 lb 7.2 oz) (12/21 1300)   Physical Exam  Constitutional: Appears  well-developed and well-nourished.  Psych: Affect appropriate to situation Eyes: No scleral injection HENT: No OP obstrucion Head: Normocephalic.  Cardiovascular: Normal rate and regular rhythm.  Respiratory: Effort normal and breath sounds normal to anterior ascultation GI: Soft.  No distension. There is no tenderness.  Skin: WDI  Neuro: Mental Status: Patient is awake, but relatively incoherent. She does follow commands and is able to answer some questions. She is slightly combative, noncooperative with formal testing. Cranial Nerves: II: Visual Fields are full. Pupils are equal, round, and reactive to light.   III,IV, VI: EOMI with marked nystagmus in both directions V: Facial sensation is symmetric to temperature VII: Facial movement is symmetric.  VIII: hearing is intact to voice X: Uvula elevates symmetrically XI: Shoulder shrug is symmetric. XII: tongue is midline without atrophy or fasciculations.  Motor: She appears to move all extremities well, but does not cooperate with formal testing. Sensory: She responds to stimuli bilaterally Cerebellar: She is clearly ataxic bilaterally      I have reviewed labs in epic and the results pertinent to this consultation are: Alcohol 395  I have reviewed the images obtained: CT head-no acute findings  Impression: 50 year old female who presented with acute onset ataxia and altered mental status while at work. I think that the alcohol  level of 400 more than adequately explains her presentation and no further workup is needed at this time.  Recommendations: 1) no further recommendations at this time.   Roland Rack, MD [facility] [phone]  If 7pm- 7am, please page neurology on call as listed in [place].

## 2016-11-23 NOTE — ED Provider Notes (Signed)
[facility] DEPT Provider Note   CSN: [id number] Arrival date & time: 11/23/16  1404     History   Chief Complaint Chief Complaint  Patient presents with  . Code Stroke    HPI IASHA RENEW is a 50 y.o. female.  Patient is a 50 year old female with a history of anxiety, anemia, hypertension, alcohol dependence and depression who presents today with altered mental status. 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 She was crying, moaning and having difficulty speaking. EMS states she was hysterical when they arrive. Difficult to do neuro exam. Initially called a code stroke due to altered mental status and slurred speech. Patient will answer some questions but is hard to get her to cooperate.   The history is provided by the patient. The history is limited by the condition of the patient.    Past Medical History:  Diagnosis Date  . ADHD (attention deficit hyperactivity disorder)   . Anemia   . Anxiety   . Cancer ([place])    bladder  . Cutaneous lupus erythematosus   . GERD (gastroesophageal reflux disease)   . History of anemia   . Hypertension   . SUI (stress urinary incontinence, female)     Patient Active Problem List   Diagnosis Date Noted  . Severe episode of recurrent major depressive disorder, without psychotic features ([place])   . Alcohol dependence with acute alcoholic intoxication with complication ([place]) 99991111  . MDD (major depressive disorder) 10/07/2015  . Attention deficit hyperactivity disorder (ADHD), predominantly inattentive type   . GAD (generalized anxiety disorder)   . 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 BLADDER SUSPENSION N/A 02/13/2013   Procedure: [facility] SLING;  Surgeon: Malka So, MD;  Location: [facility];  Service: Urology;   Laterality: N/A;  . CESAREAN SECTION  12-23-2004    W/ LEFT TUBAL LIGATION AND RIGHT SALPINGECTOMY  . CESAREAN SECTION  1995  . ORIF HUMERUS FRACTURE Right 04/28/2014   Procedure: OPEN REDUCTION INTERNAL FIXATION (ORIF) PROXIMAL HUMERUS FRACTURE;  Surgeon: Nita Sells, MD;  Location: [place];  Service: Orthopedics;  Laterality: Right;  . ORIF PROXIMAL HUMERUS FRACTURE Right 04/28/2014   DR CHANDLER  . TUBAL LIGATION      OB History    No data available       Home Medications    Prior to Admission medications   Medication Sig Start Date End Date Taking? Authorizing Provider  hydrOXYzine (ATARAX/VISTARIL) 25 MG tablet Take 1 tablet (25 mg total) by mouth every 6 (six) hours as needed for anxiety. 01/10/16   Derrill Center, NP  Multiple Vitamin (MULTIVITAMIN WITH MINERALS) TABS tablet Take 1 tablet by mouth daily. 01/10/16   Derrill Center, NP  nicotine polacrilex (NICORETTE) 2 MG gum Take 1 each (2 mg total) by mouth as needed for smoking cessation. 01/10/16   Derrill Center, NP  traZODone (DESYREL) 50 MG tablet Take 1 tablet (50 mg total) by mouth at bedtime as needed for sleep. 01/10/16   Derrill Center, NP  Vilazodone HCl (VIIBRYD) 20 MG TABS Take 1.5 tablets (30 mg total) by mouth daily with breakfast. 01/10/16   Derrill Center, NP    Family History No family history on file.  Social History Social History  Substance Use Topics  .  Smoking status: Current Every Day Smoker    Packs/day: 0.25    Years: 20.00    Types: Cigarettes  . Smokeless tobacco: Never Used  . Alcohol use Yes     Comment: was in recovery for 4 months.      Allergies   Vicodin [hydrocodone-acetaminophen]   Review of Systems Review of Systems  Unable to perform ROS: Mental status change     Physical Exam Updated Vital Signs BP 109/75 (BP Location: Right Arm)   Pulse 79   Temp 98 F (36.7 C) (Oral)   Resp 15   Ht 5\' 4"  (1.626 m)   Wt 166 lb 7.2 oz (75.5 kg)   SpO2 95%   BMI 28.57 kg/m    Physical Exam  Constitutional: She appears well-developed and well-nourished. She appears distressed.  Crying and rolling around in the bed  HENT:  Head: Normocephalic and atraumatic.  Mouth/Throat: Oropharynx is clear and moist.  Eyes: Conjunctivae and EOM are normal. Pupils are equal, round, and reactive to light.  Neck: Normal range of motion. Neck supple.  Cardiovascular: Normal rate, regular rhythm and intact distal pulses.   No murmur heard. Pulmonary/Chest: Effort normal and breath sounds normal. No respiratory distress. She has no wheezes. She has no rales.  Abdominal: Soft. She exhibits no distension. There is no tenderness. There is no rebound and no guarding.  Musculoskeletal: Normal range of motion. She exhibits no edema or tenderness.  Neurological: She is alert. She has normal strength. No cranial nerve deficit or sensory deficit. Coordination abnormal.  Abnormal finger to nose testing bilaterally. Nystagmus with eye movement in all directions. Patient is hysterical and crying.  Skin: Skin is warm and dry. No rash noted. No erythema.  Psychiatric:  Patient is tearful with slurred speech. Unable to answer some questions but other questions seems that she answers appropriately  Nursing note and vitals reviewed.    ED Treatments / Results  Labs (all labs ordered are listed, but only abnormal results are displayed) Labs Reviewed  ETHANOL - Abnormal; Notable for the following:       Result Value   Alcohol, Ethyl (B) 395 (*)    All other components within normal limits  COMPREHENSIVE METABOLIC PANEL - Abnormal; Notable for the following:    Chloride 112 (*)    All other components within normal limits  PROTIME-INR  APTT  CBC  DIFFERENTIAL  RAPID URINE DRUG SCREEN, HOSP PERFORMED  URINALYSIS, ROUTINE W REFLEX MICROSCOPIC  LITHIUM LEVEL  I-STAT CHEM 8, ED  I-STAT TROPOININ, ED  CBG MONITORING, ED    EKG  EKG Interpretation None       Radiology Ct Head  Code Stroke Wo Contrast  Result Date: 11/23/2016 CLINICAL DATA:  Code stroke.  Altered mental status. EXAM: CT HEAD WITHOUT CONTRAST TECHNIQUE: Contiguous axial images were obtained from the base of the skull through the vertex without intravenous contrast. COMPARISON:  01/04/2016 FINDINGS: Brain: There is no evidence of acute cortical infarct, intracranial hemorrhage, mass, midline shift, or extra-axial fluid collection. Small region of asymmetric hypoattenuation in the left occipital lobe is felt to be artifactual and related to skull asymmetry. The ventricles and sulci are normal. Vascular: No hyperdense vessel or unexpected calcification. Skull: No fracture or focal osseous lesion. Sinuses/Orbits: No significant sinus disease.  Unremarkable orbits. Other: None. ASPECTS [facility] Stroke Program Early CT Score) - Ganglionic level infarction (caudate, lentiform nuclei, internal capsule, insula, M1-M3 cortex): 7 - Supraganglionic infarction (M4-M6 cortex): 3 Total score (0-10  with 10 being normal): 10 IMPRESSION: 1. No evidence of acute intracranial abnormality. 2. ASPECTS is 10. These results were called by telephone at the time of interpretation on 11/23/2016 at 2:24 pm to Dr. Roland Rack , who verbally acknowledged these results. Electronically Signed   By: Logan Bores M.D.   On: 11/23/2016 14:30    Procedures Procedures (including critical care time)  Medications Ordered in ED Medications  LORazepam (ATIVAN) 2 MG/ML injection (not administered)  LORazepam (ATIVAN) injection 1 mg (1 mg Intravenous Given 11/23/16 1421)  LORazepam (ATIVAN) injection 1 mg (1 mg Intravenous Given 11/23/16 1427)     Initial Impression / Assessment and Plan / ED Course  I have reviewed the triage vital signs and the nursing notes.  Pertinent labs & imaging results that were available during my care of the patient were reviewed by me and considered in my medical decision making (see chart for  details).  Clinical Course     Patient is a 50 year old female presenting today with altered mental status initially called a code stroke. Patient is hysterical on exam and tearful. It does seem that she answers some questions appropriately and denies dizziness or vision changes however she has nystagmus and difficulty with coordination with mild slurred speech. When asked if she had been drinking at one point she says yes and another point she says no. She was at work today when she started acting abnormal. Patient has no noted weakness, facial droop or numbness. 3:08 PM Initially patient evaluated by neurology and thought to get an MRI to rule out cerebellar stroke when alcohol level came back at almost 400. Patient's lithium level is pending but would be unusual for her to be lithium toxicity due to the abrupt nature of symptoms. Patient was given Ativan due to hysterical behavior. We'll have to allow to metabolize to freedom.  Final Clinical Impressions(s) / ED Diagnoses   Final diagnoses:  Alcohol intoxication with delirium [facility])    New Prescriptions New Prescriptions   No medications on file     Blanchie Dessert, MD 11/25/16 0825

## 2016-11-23 NOTE — ED Notes (Signed)
Spoke with pts husband Dominica Severin). He knows pt is here.

## 2016-11-23 NOTE — ED Notes (Signed)
EKG given to Dr. Plunkett 

## 2016-11-23 NOTE — Discharge Instructions (Signed)
Return to the emergency department for any new or bothersome symptoms.

## 2016-11-23 NOTE — ED Notes (Signed)
Pt's CBG 88.  Informed Whitney, RN.

## 2016-11-23 NOTE — ED Triage Notes (Signed)
Pt brought in by EMS due to having altered gait and incomprehensible speech. Pt LSN was 12 oclock. Pt very tearful on presentation and able to follow commands some of the time.

## 2016-11-23 NOTE — ED Provider Notes (Signed)
Care assumed from Dr. Maryan Rued at shift change. Patient is a 50 year old female with history of chronic alcoholism. She was brought by EMS for mental status change. 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 It turns out she was heavily intoxicated with a blood alcohol of 395. She was observed here for some time and eventually woke enough to the point she could eat and drink. Her husband arrived and is very familiar with this situation as it has happened many times in the past. He is willing to take her home. She is to follow-up with her outpatient treatment program.   Veryl Speak, MD 11/23/16 2113

## 2016-11-23 NOTE — ED Notes (Signed)
Tried to ambulate patient. Patient was to unstable to stand encouraged patient to get back and bed and we would try it again in a few mins.[name]

## 2016-11-24 LAB — I-STAT CHEM 8, ED
BUN: 10 mg/dL (ref 6–20)
CALCIUM ION: 1.06 mmol/L — AB (ref 1.15–1.40)
CHLORIDE: 110 mmol/L (ref 101–111)
CREATININE: 1.2 mg/dL — AB (ref 0.44–1.00)
GLUCOSE: 90 mg/dL (ref 65–99)
HCT: 43 % (ref 36.0–46.0)
Hemoglobin: 14.6 g/dL (ref 12.0–15.0)
Potassium: 4.2 mmol/L (ref 3.5–5.1)
Sodium: 144 mmol/L (ref 135–145)
TCO2: 25 mmol/L (ref 0–100)

## 2016-11-24 LAB — I-STAT TROPONIN, ED: Troponin i, poc: 0 ng/mL (ref 0.00–0.08)

## 2018-01-10 DIAGNOSIS — Z79899 Other long term (current) drug therapy: Secondary | ICD-10-CM | POA: Diagnosis not present

## 2018-03-01 DIAGNOSIS — H524 Presbyopia: Secondary | ICD-10-CM | POA: Diagnosis not present

## 2018-03-01 DIAGNOSIS — H5213 Myopia, bilateral: Secondary | ICD-10-CM | POA: Diagnosis not present

## 2018-03-01 DIAGNOSIS — H52223 Regular astigmatism, bilateral: Secondary | ICD-10-CM | POA: Diagnosis not present

## 2018-10-18 DIAGNOSIS — M9902 Segmental and somatic dysfunction of thoracic region: Secondary | ICD-10-CM | POA: Diagnosis not present

## 2018-10-18 DIAGNOSIS — M25572 Pain in left ankle and joints of left foot: Secondary | ICD-10-CM | POA: Diagnosis not present

## 2018-10-18 DIAGNOSIS — M546 Pain in thoracic spine: Secondary | ICD-10-CM | POA: Diagnosis not present

## 2018-10-21 DIAGNOSIS — M9902 Segmental and somatic dysfunction of thoracic region: Secondary | ICD-10-CM | POA: Diagnosis not present

## 2018-10-21 DIAGNOSIS — M546 Pain in thoracic spine: Secondary | ICD-10-CM | POA: Diagnosis not present

## 2018-10-21 DIAGNOSIS — M25572 Pain in left ankle and joints of left foot: Secondary | ICD-10-CM | POA: Diagnosis not present

## 2018-10-22 DIAGNOSIS — M546 Pain in thoracic spine: Secondary | ICD-10-CM | POA: Diagnosis not present

## 2018-10-22 DIAGNOSIS — M9902 Segmental and somatic dysfunction of thoracic region: Secondary | ICD-10-CM | POA: Diagnosis not present

## 2018-10-22 DIAGNOSIS — M25572 Pain in left ankle and joints of left foot: Secondary | ICD-10-CM | POA: Diagnosis not present

## 2018-10-24 DIAGNOSIS — M25572 Pain in left ankle and joints of left foot: Secondary | ICD-10-CM | POA: Diagnosis not present

## 2018-10-24 DIAGNOSIS — M546 Pain in thoracic spine: Secondary | ICD-10-CM | POA: Diagnosis not present

## 2018-10-24 DIAGNOSIS — M9902 Segmental and somatic dysfunction of thoracic region: Secondary | ICD-10-CM | POA: Diagnosis not present

## 2018-10-25 DIAGNOSIS — M546 Pain in thoracic spine: Secondary | ICD-10-CM | POA: Diagnosis not present

## 2018-10-25 DIAGNOSIS — M25572 Pain in left ankle and joints of left foot: Secondary | ICD-10-CM | POA: Diagnosis not present

## 2018-10-25 DIAGNOSIS — M9902 Segmental and somatic dysfunction of thoracic region: Secondary | ICD-10-CM | POA: Diagnosis not present

## 2018-10-28 DIAGNOSIS — M25572 Pain in left ankle and joints of left foot: Secondary | ICD-10-CM | POA: Diagnosis not present

## 2018-10-28 DIAGNOSIS — M546 Pain in thoracic spine: Secondary | ICD-10-CM | POA: Diagnosis not present

## 2018-10-28 DIAGNOSIS — M9902 Segmental and somatic dysfunction of thoracic region: Secondary | ICD-10-CM | POA: Diagnosis not present

## 2018-11-25 DIAGNOSIS — M25572 Pain in left ankle and joints of left foot: Secondary | ICD-10-CM | POA: Diagnosis not present

## 2018-11-25 DIAGNOSIS — M546 Pain in thoracic spine: Secondary | ICD-10-CM | POA: Diagnosis not present

## 2018-11-25 DIAGNOSIS — M9902 Segmental and somatic dysfunction of thoracic region: Secondary | ICD-10-CM | POA: Diagnosis not present

## 2018-12-11 DIAGNOSIS — M546 Pain in thoracic spine: Secondary | ICD-10-CM | POA: Diagnosis not present

## 2018-12-11 DIAGNOSIS — M9902 Segmental and somatic dysfunction of thoracic region: Secondary | ICD-10-CM | POA: Diagnosis not present

## 2018-12-11 DIAGNOSIS — M25572 Pain in left ankle and joints of left foot: Secondary | ICD-10-CM | POA: Diagnosis not present

## 2018-12-19 DIAGNOSIS — S92352A Displaced fracture of fifth metatarsal bone, left foot, initial encounter for closed fracture: Secondary | ICD-10-CM | POA: Diagnosis not present

## 2018-12-19 DIAGNOSIS — S92325A Nondisplaced fracture of second metatarsal bone, left foot, initial encounter for closed fracture: Secondary | ICD-10-CM | POA: Diagnosis not present

## 2018-12-25 ENCOUNTER — Emergency Department
Admission: EM | Admit: 2018-12-25 | Discharge: 2018-12-26 | Disposition: A | Discharge status: Home / Self Care | Payer: Commercial Managed Care - PPO | Attending: Emergency Medicine | Admitting: Emergency Medicine

## 2018-12-25 ENCOUNTER — Emergency Department: Payer: Commercial Managed Care - PPO

## 2018-12-25 ENCOUNTER — Other Ambulatory Visit: Payer: Self-pay

## 2018-12-25 DIAGNOSIS — Y908 Blood alcohol level of 240 mg/100 ml or more: Secondary | ICD-10-CM | POA: Diagnosis not present

## 2018-12-25 DIAGNOSIS — Z046 Encounter for general psychiatric examination, requested by authority: Secondary | ICD-10-CM | POA: Insufficient documentation

## 2018-12-25 DIAGNOSIS — Z79899 Other long term (current) drug therapy: Secondary | ICD-10-CM | POA: Diagnosis not present

## 2018-12-25 DIAGNOSIS — R52 Pain, unspecified: Secondary | ICD-10-CM | POA: Diagnosis not present

## 2018-12-25 DIAGNOSIS — F10229 Alcohol dependence with intoxication, unspecified: Secondary | ICD-10-CM | POA: Diagnosis present

## 2018-12-25 DIAGNOSIS — M5489 Other dorsalgia: Secondary | ICD-10-CM | POA: Diagnosis not present

## 2018-12-25 DIAGNOSIS — R45851 Suicidal ideations: Secondary | ICD-10-CM

## 2018-12-25 DIAGNOSIS — S0990XA Unspecified injury of head, initial encounter: Secondary | ICD-10-CM | POA: Diagnosis not present

## 2018-12-25 DIAGNOSIS — F1014 Alcohol abuse with alcohol-induced mood disorder: Secondary | ICD-10-CM | POA: Diagnosis present

## 2018-12-25 DIAGNOSIS — Z8551 Personal history of malignant neoplasm of bladder: Secondary | ICD-10-CM | POA: Insufficient documentation

## 2018-12-25 DIAGNOSIS — F10220 Alcohol dependence with intoxication, uncomplicated: Secondary | ICD-10-CM | POA: Diagnosis not present

## 2018-12-25 DIAGNOSIS — F329 Major depressive disorder, single episode, unspecified: Secondary | ICD-10-CM | POA: Diagnosis not present

## 2018-12-25 DIAGNOSIS — F17210 Nicotine dependence, cigarettes, uncomplicated: Secondary | ICD-10-CM | POA: Insufficient documentation

## 2018-12-25 DIAGNOSIS — I10 Essential (primary) hypertension: Secondary | ICD-10-CM | POA: Diagnosis not present

## 2018-12-25 DIAGNOSIS — F411 Generalized anxiety disorder: Secondary | ICD-10-CM | POA: Diagnosis present

## 2018-12-25 DIAGNOSIS — F10920 Alcohol use, unspecified with intoxication, uncomplicated: Secondary | ICD-10-CM

## 2018-12-25 DIAGNOSIS — R4182 Altered mental status, unspecified: Secondary | ICD-10-CM | POA: Diagnosis present

## 2018-12-25 DIAGNOSIS — S199XXA Unspecified injury of neck, initial encounter: Secondary | ICD-10-CM | POA: Diagnosis not present

## 2018-12-25 LAB — CBC WITH DIFFERENTIAL/PLATELET
Abs Immature Granulocytes: 0.02 10*3/uL (ref 0.00–0.07)
BASOS ABS: 0 10*3/uL (ref 0.0–0.1)
Basophils Relative: 1 %
Eosinophils Absolute: 0.2 10*3/uL (ref 0.0–0.5)
Eosinophils Relative: 3 %
HEMATOCRIT: 44.6 % (ref 36.0–46.0)
HEMOGLOBIN: 14.3 g/dL (ref 12.0–15.0)
IMMATURE GRANULOCYTES: 0 %
LYMPHS ABS: 1.4 10*3/uL (ref 0.7–4.0)
LYMPHS PCT: 30 %
MCH: 30.5 pg (ref 26.0–34.0)
MCHC: 32.1 g/dL (ref 30.0–36.0)
MCV: 95.1 fL (ref 80.0–100.0)
Monocytes Absolute: 0.2 10*3/uL (ref 0.1–1.0)
Monocytes Relative: 4 %
NEUTROS ABS: 2.9 10*3/uL (ref 1.7–7.7)
NEUTROS PCT: 62 %
Platelets: 214 10*3/uL (ref 150–400)
RBC: 4.69 MIL/uL (ref 3.87–5.11)
RDW: 13.1 % (ref 11.5–15.5)
WBC: 4.7 10*3/uL (ref 4.0–10.5)
nRBC: 0 % (ref 0.0–0.2)

## 2018-12-25 LAB — SALICYLATE LEVEL: SALICYLATE LVL: 16.3 mg/dL (ref 2.8–30.0)

## 2018-12-25 LAB — BASIC METABOLIC PANEL
ANION GAP: 6 (ref 5–15)
BUN: 10 mg/dL (ref 6–20)
CALCIUM: 8.8 mg/dL — AB (ref 8.9–10.3)
CHLORIDE: 108 mmol/L (ref 98–111)
CO2: 27 mmol/L (ref 22–32)
Creatinine, Ser: 0.78 mg/dL (ref 0.44–1.00)
GFR calc non Af Amer: >60 mL/min (ref >60)
Glucose, Bld: 95 mg/dL (ref 70–99)
Potassium: 4.2 mmol/L (ref 3.5–5.1)
Sodium: 141 mmol/L (ref 135–145)

## 2018-12-25 LAB — ACETAMINOPHEN LEVEL

## 2018-12-25 LAB — ETHANOL: ALCOHOL ETHYL (B): 288 mg/dL — AB (ref <10)

## 2018-12-25 NOTE — ED Notes (Signed)
Pt. Resting comfortable in bed.  Pt. Requesting to speak with chaplin.  Pt. Given phone upon request.
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
## 2018-12-25 NOTE — ED Notes (Signed)
Pt was heard in room stating "I want to f*cking kill myself." appears highly intoxicated. Husband at bedside. Officers at bedside. Orthoptist.

## 2018-12-25 NOTE — ED Notes (Signed)
Pt denies SI for this Probation officer.  Pt states her father has dementia and she doesn't know how to deal with it. Pt also reports she has an anxiety disorder.  Pt's speech was slurred, switching frequently from being tearful to laughing.    Maintained on 15 minute checks and observation by security for safety.
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
## 2018-12-25 NOTE — ED Notes (Addendum)
Pt dressed into hospital scrubs with this NT and Vet, NT. Pt belongings include:  Apple Iphone with Wallet attached to case Pair of silver small hooped earrings 1 Pair of crocs 1 pair of socks Jeans Panties Tan bra Black Camisole Dietitian  Pt Belongings given to Spouse per pt request.

## 2018-12-25 NOTE — ED Notes (Signed)
Pt taken to CT scan.

## 2018-12-26 DIAGNOSIS — F10229 Alcohol dependence with intoxication, unspecified: Secondary | ICD-10-CM

## 2018-12-26 NOTE — ED Notes (Signed)
Hourly rounding reveals patient sleeping in room. No complaints, stable, in no acute distress. Q15 minute rounds and monitoring via Security Cameras to continue. 

## 2018-12-26 NOTE — ED Notes (Signed)
Pt. Requested and was given apple juice.

## 2018-12-26 NOTE — ED Notes (Signed)
IVC  PAPERS  RESCINDED  PER  DR  CLAPACS  MD   INFORMED  RN  Donneta Romberg
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
## 2018-12-26 NOTE — ED Notes (Signed)
Pt. Transferred to [place] from ED to room after screening for contraband. Report to include Situation, Background, Assessment and Recommendations from RN. Pt. Oriented to unit including Q15 minute rounds as well as the security cameras for their protection. Patient is alert and oriented, warm and dry in no acute distress. Patient denies SI, HI, and AVH. Pt. Encouraged to let me know if needs arise.

## 2018-12-26 NOTE — ED Notes (Signed)
Patient talking to Dr. Clapacs 

## 2018-12-26 NOTE — Discharge Instructions (Signed)
Please continue to follow-up with AA and the [facility].  Please return here as needed.

## 2018-12-26 NOTE — ED Notes (Signed)
Patient discharged home to husband, patient received discharge papers. Patient received belongings from husband, and verbalized she has received all of her belongings. Patient appropriate and cooperative, Denies SI/HI AVH. Vital signs taken. NAD noted.
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
## 2018-12-26 NOTE — ED Provider Notes (Signed)
Patient seen by Dr. Weber Cooks and cleared.  He feels she is safe to go home she has therapy with [facility] and [facility].   Nena Polio, MD 12/26/18 1304
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
## 2019-01-28 DIAGNOSIS — R251 Tremor, unspecified: Secondary | ICD-10-CM | POA: Diagnosis not present

## 2019-01-28 DIAGNOSIS — R51 Headache: Secondary | ICD-10-CM | POA: Diagnosis not present

## 2019-01-28 DIAGNOSIS — F17200 Nicotine dependence, unspecified, uncomplicated: Secondary | ICD-10-CM | POA: Diagnosis not present

## 2019-02-11 ENCOUNTER — Other Ambulatory Visit: Payer: Self-pay | Admitting: Family Medicine

## 2019-08-26 ENCOUNTER — Other Ambulatory Visit: Payer: Self-pay | Admitting: Family Medicine

## 2019-08-26 DIAGNOSIS — Z1231 Encounter for screening mammogram for malignant neoplasm of breast: Secondary | ICD-10-CM

## 2019-09-06 ENCOUNTER — Other Ambulatory Visit: Payer: Self-pay

## 2019-09-06 ENCOUNTER — Emergency Department: Payer: Commercial Managed Care - PPO

## 2019-09-06 ENCOUNTER — Encounter: Payer: Self-pay | Admitting: Emergency Medicine

## 2019-09-06 ENCOUNTER — Inpatient Hospital Stay
Admission: EM | Admit: 2019-09-06 | Discharge: 2019-09-10 | DRG: 392 | Disposition: A | Discharge status: Home / Self Care | Payer: Commercial Managed Care - PPO | Attending: Surgery | Admitting: Surgery

## 2019-09-06 DIAGNOSIS — L930 Discoid lupus erythematosus: Secondary | ICD-10-CM | POA: Diagnosis present

## 2019-09-06 DIAGNOSIS — K5732 Diverticulitis of large intestine without perforation or abscess without bleeding: Secondary | ICD-10-CM | POA: Diagnosis present

## 2019-09-06 DIAGNOSIS — Z20828 Contact with and (suspected) exposure to other viral communicable diseases: Secondary | ICD-10-CM | POA: Diagnosis present

## 2019-09-06 DIAGNOSIS — R1032 Left lower quadrant pain: Secondary | ICD-10-CM

## 2019-09-06 DIAGNOSIS — F17210 Nicotine dependence, cigarettes, uncomplicated: Secondary | ICD-10-CM | POA: Diagnosis present

## 2019-09-06 DIAGNOSIS — F329 Major depressive disorder, single episode, unspecified: Secondary | ICD-10-CM | POA: Diagnosis present

## 2019-09-06 DIAGNOSIS — F909 Attention-deficit hyperactivity disorder, unspecified type: Secondary | ICD-10-CM | POA: Diagnosis present

## 2019-09-06 DIAGNOSIS — K5720 Diverticulitis of large intestine with perforation and abscess without bleeding: Secondary | ICD-10-CM | POA: Diagnosis present

## 2019-09-06 DIAGNOSIS — K219 Gastro-esophageal reflux disease without esophagitis: Secondary | ICD-10-CM | POA: Diagnosis present

## 2019-09-06 DIAGNOSIS — I10 Essential (primary) hypertension: Secondary | ICD-10-CM | POA: Diagnosis present

## 2019-09-06 DIAGNOSIS — F419 Anxiety disorder, unspecified: Secondary | ICD-10-CM | POA: Diagnosis present

## 2019-09-06 DIAGNOSIS — K5792 Diverticulitis of intestine, part unspecified, without perforation or abscess without bleeding: Secondary | ICD-10-CM

## 2019-09-06 DIAGNOSIS — R1013 Epigastric pain: Secondary | ICD-10-CM

## 2019-09-06 DIAGNOSIS — R109 Unspecified abdominal pain: Secondary | ICD-10-CM | POA: Diagnosis present

## 2019-09-06 DIAGNOSIS — Z885 Allergy status to narcotic agent status: Secondary | ICD-10-CM | POA: Diagnosis not present

## 2019-09-06 DIAGNOSIS — R112 Nausea with vomiting, unspecified: Secondary | ICD-10-CM

## 2019-09-06 LAB — CBC
HCT: 47.2 % — ABNORMAL HIGH (ref 36.0–46.0)
Hemoglobin: 15.4 g/dL — ABNORMAL HIGH (ref 12.0–15.0)
MCH: 29.3 pg (ref 26.0–34.0)
MCHC: 32.6 g/dL (ref 30.0–36.0)
MCV: 89.9 fL (ref 80.0–100.0)
Platelets: 201 10*3/uL (ref 150–400)
RBC: 5.25 MIL/uL — ABNORMAL HIGH (ref 3.87–5.11)
RDW: 13.7 % (ref 11.5–15.5)
WBC: 14.4 10*3/uL — ABNORMAL HIGH (ref 4.0–10.5)
nRBC: 0 % (ref 0.0–0.2)

## 2019-09-06 LAB — COMPREHENSIVE METABOLIC PANEL
ALT: 13 U/L (ref 0–44)
AST: 14 U/L — ABNORMAL LOW (ref 15–41)
Albumin: 4.3 g/dL (ref 3.5–5.0)
Alkaline Phosphatase: 107 U/L (ref 38–126)
Anion gap: 11 (ref 5–15)
BUN: 19 mg/dL (ref 6–20)
CO2: 24 mmol/L (ref 22–32)
Calcium: 9.3 mg/dL (ref 8.9–10.3)
Chloride: 101 mmol/L (ref 98–111)
Creatinine, Ser: 0.65 mg/dL (ref 0.44–1.00)
GFR calc Af Amer: >60 mL/min (ref >60)
GFR calc non Af Amer: >60 mL/min (ref >60)
Glucose, Bld: 95 mg/dL (ref 70–99)
Potassium: 3.6 mmol/L (ref 3.5–5.1)
Sodium: 136 mmol/L (ref 135–145)
Total Bilirubin: 1.4 mg/dL — ABNORMAL HIGH (ref 0.3–1.2)
Total Protein: 7.6 g/dL (ref 6.5–8.1)

## 2019-09-06 LAB — URINALYSIS, COMPLETE (UACMP) WITH MICROSCOPIC
Bacteria, UA: NONE SEEN
Bilirubin Urine: NEGATIVE
Glucose, UA: NEGATIVE mg/dL
Hgb urine dipstick: NEGATIVE
Ketones, ur: NEGATIVE mg/dL
Leukocytes,Ua: NEGATIVE
Nitrite: NEGATIVE
Protein, ur: NEGATIVE mg/dL
Specific Gravity, Urine: 1.017 (ref 1.005–1.030)
Squamous Epithelial / HPF: NONE SEEN (ref 0–5)
pH: 5 (ref 5.0–8.0)

## 2019-09-06 LAB — SARS CORONAVIRUS 2 BY RT PCR (HOSPITAL ORDER, PERFORMED IN ~~LOC~~ HOSPITAL LAB): SARS Coronavirus 2: NEGATIVE

## 2019-09-06 LAB — LIPASE, BLOOD: Lipase: 18 U/L (ref 11–51)

## 2019-09-06 MED ORDER — ONDANSETRON 4 MG PO TBDP
4.0000 mg | ORAL_TABLET | Freq: Four times a day (QID) | ORAL | Status: DC | PRN
  Filled 2019-09-06: qty 1

## 2019-09-06 MED ORDER — PANTOPRAZOLE SODIUM 40 MG IV SOLR
40.0000 mg | Freq: Once | INTRAVENOUS | Status: AC
  Administered 2019-09-06: 40 mg via INTRAVENOUS
  Filled 2019-09-06: qty 40

## 2019-09-06 MED ORDER — PIPERACILLIN-TAZOBACTAM 3.375 G IVPB
3.3750 g | Freq: Three times a day (TID) | INTRAVENOUS | Status: DC
  Administered 2019-09-06 – 2019-09-08 (×6): 3.375 g via INTRAVENOUS
  Filled 2019-09-06 (×6): qty 50

## 2019-09-06 MED ORDER — IPRATROPIUM-ALBUTEROL 0.5-2.5 (3) MG/3ML IN SOLN
3.0000 mL | Freq: Once | RESPIRATORY_TRACT | Status: AC
  Administered 2019-09-06: 3 mL via RESPIRATORY_TRACT
  Filled 2019-09-06: qty 3

## 2019-09-06 MED ORDER — SODIUM CHLORIDE 0.9 % IV BOLUS
1000.0000 mL | Freq: Once | INTRAVENOUS | Status: AC
  Administered 2019-09-06: 1000 mL via INTRAVENOUS

## 2019-09-06 MED ORDER — MORPHINE SULFATE (PF) 2 MG/ML IV SOLN
2.0000 mg | INTRAVENOUS | Status: DC | PRN
  Administered 2019-09-06 – 2019-09-07 (×4): 2 mg via INTRAVENOUS
  Filled 2019-09-06 (×4): qty 1

## 2019-09-06 MED ORDER — SODIUM CHLORIDE 0.9 % IV SOLN
INTRAVENOUS | Status: DC
  Administered 2019-09-06 – 2019-09-08 (×7): via INTRAVENOUS

## 2019-09-06 MED ORDER — PROCHLORPERAZINE EDISYLATE 10 MG/2ML IJ SOLN
5.0000 mg | Freq: Four times a day (QID) | INTRAMUSCULAR | Status: DC | PRN
  Filled 2019-09-06: qty 2

## 2019-09-06 MED ORDER — MORPHINE SULFATE (PF) 4 MG/ML IV SOLN
4.0000 mg | Freq: Once | INTRAVENOUS | Status: AC
  Administered 2019-09-06: 4 mg via INTRAVENOUS
  Filled 2019-09-06: qty 1

## 2019-09-06 MED ORDER — HYDROMORPHONE HCL 1 MG/ML IJ SOLN
1.0000 mg | Freq: Once | INTRAMUSCULAR | Status: AC
  Administered 2019-09-06: 1 mg via INTRAVENOUS
  Filled 2019-09-06: qty 1

## 2019-09-06 MED ORDER — ENOXAPARIN SODIUM 40 MG/0.4ML ~~LOC~~ SOLN
40.0000 mg | SUBCUTANEOUS | Status: DC
  Administered 2019-09-06 – 2019-09-09 (×4): 40 mg via SUBCUTANEOUS
  Filled 2019-09-06 (×4): qty 0.4

## 2019-09-06 MED ORDER — ONDANSETRON HCL 4 MG/2ML IJ SOLN
4.0000 mg | Freq: Once | INTRAMUSCULAR | Status: AC
  Administered 2019-09-06: 4 mg via INTRAVENOUS
  Filled 2019-09-06: qty 2

## 2019-09-06 MED ORDER — SODIUM CHLORIDE 0.9% FLUSH: 3.0000 mL | Freq: Once | INTRAVENOUS | Status: DC

## 2019-09-06 MED ORDER — IOHEXOL 300 MG/ML  SOLN
125.0000 mL | Freq: Once | INTRAMUSCULAR | Status: AC | PRN
  Administered 2019-09-06: 100 mL via INTRAVENOUS

## 2019-09-06 MED ORDER — PIPERACILLIN-TAZOBACTAM 3.375 G IVPB 30 MIN
3.3750 g | Freq: Once | INTRAVENOUS | Status: AC
  Administered 2019-09-06: 3.375 g via INTRAVENOUS
  Filled 2019-09-06: qty 50

## 2019-09-06 MED ORDER — ONDANSETRON HCL 4 MG/2ML IJ SOLN
4.0000 mg | Freq: Four times a day (QID) | INTRAMUSCULAR | Status: DC | PRN
  Administered 2019-09-07: 4 mg via INTRAVENOUS
  Filled 2019-09-06: qty 2

## 2019-09-06 MED ORDER — PROCHLORPERAZINE MALEATE 10 MG PO TABS
10.0000 mg | ORAL_TABLET | Freq: Four times a day (QID) | ORAL | Status: DC | PRN
  Filled 2019-09-06: qty 1

## 2019-09-06 MED ORDER — ACETAMINOPHEN 500 MG PO TABS
1000.0000 mg | ORAL_TABLET | Freq: Four times a day (QID) | ORAL | Status: DC
  Administered 2019-09-06 – 2019-09-10 (×14): 1000 mg via ORAL
  Filled 2019-09-06 (×15): qty 2

## 2019-09-06 MED ORDER — OXYCODONE HCL 5 MG PO TABS
5.0000 mg | ORAL_TABLET | ORAL | Status: DC | PRN
  Administered 2019-09-07 – 2019-09-10 (×10): 10 mg via ORAL
  Filled 2019-09-06 (×10): qty 2

## 2019-09-06 NOTE — ED Notes (Signed)
Attempted to call report x 1 but was unsuccessful

## 2019-09-06 NOTE — ED Triage Notes (Signed)
L lower abdominal pain woke her at night.

## 2019-09-06 NOTE — ED Notes (Signed)
Report given to floor RN

## 2019-09-06 NOTE — ED Notes (Signed)
Pt ambulated to toilet without assistance, still c/o 10/10 pain, will notifiy Dr. Joan Mayans

## 2019-09-06 NOTE — ED Provider Notes (Signed)
[facility] Emergency Department Provider Note  ____________________________________________   First MD Initiated Contact with Patient 09/06/19 1546     (approximate)  I have reviewed the triage vital signs and the nursing notes.  History  Chief Complaint Abdominal Pain    HPI [name] is a 53 y.o. female with a history of hypertension, depression, prior alcohol use - now sober, who presents to the emergency department for abdominal pain.  Patient reports this morning she woke up with acute onset of left lower quadrant abdominal pain.  On arrival to the emergency department, she reports pain to the LLQ as well as epigastric area.  Described as an aching sensation.  Moderate to severe in intensity.  Associated with some nausea and vomiting.  No diarrhea.  She denies any dysuria, hematuria, or malodorous urine.  She does states she uses BC powders on a near daily basis.  She denies any alcohol use.  She denies any history of diverticulitis or nephrolithiasis.   Past Medical Hx Past Medical History:  Diagnosis Date  . ADHD (attention deficit hyperactivity disorder)   . Anemia   . Anxiety   . Cancer ([place])    bladder  . Cutaneous lupus erythematosus   . GERD (gastroesophageal reflux disease)   . History of anemia   . Hypertension   . SUI (stress urinary incontinence, female)     Problem List Patient Active Problem List   Diagnosis Date Noted  . Severe episode of recurrent major depressive disorder, without psychotic features ([place])   . Alcohol dependence with acute alcoholic intoxication with complication ([place]) [id number]  . MDD (major depressive disorder) 10/07/2015  . Attention deficit hyperactivity disorder (ADHD), predominantly inattentive type   . GAD (generalized anxiety disorder)   . 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 BLADDER SUSPENSION N/A  02/13/2013   Procedure: [facility] SLING;  Surgeon: Malka So, MD;  Location: [facility];  Service: Urology;  Laterality: N/A;  . CESAREAN SECTION  12-23-2004    W/ LEFT TUBAL LIGATION AND RIGHT SALPINGECTOMY  . CESAREAN SECTION  1995  . ORIF HUMERUS FRACTURE Right 04/28/2014   Procedure: OPEN REDUCTION INTERNAL FIXATION (ORIF) PROXIMAL HUMERUS FRACTURE;  Surgeon: Nita Sells, MD;  Location: [place];  Service: Orthopedics;  Laterality: Right;  . ORIF PROXIMAL HUMERUS FRACTURE Right 04/28/2014   DR CHANDLER  . TUBAL LIGATION      Medications Prior to Admission medications   Medication Sig Start Date End Date Taking? Authorizing Provider  ADZENYS XR-ODT 12.5 MG TBED Take 1 tablet by mouth daily.  11/28/18   [provider]  ALPRAZolam Duanne Moron) 0.5 MG tablet Take 0.5 mg by mouth daily.  12/18/18   [provider]  FLUoxetine (PROZAC) 40 MG capsule Take 40 mg by mouth daily.  12/18/18   [provider]  PROAIR HFA 108 (90 Base) MCG/ACT inhaler Inhale 2 puffs into the lungs every 4 (four) hours as needed.  11/24/18   [provider]  VIIBRYD 40 MG TABS Take 40 mg by mouth daily. 10/17/16   [provider]    Allergies Codeine and Vicodin [hydrocodone-acetaminophen]  Family Hx No family history on file.  Social Hx Social History   Tobacco Use  . Smoking status: Current Every Day Smoker    Packs/day: 0.25    Years: 20.00    Pack years: 5.00  Types: Cigarettes  . Smokeless tobacco: Never Used  Substance Use Topics  . Alcohol use: Yes    Comment: was in recovery for 4 months.   . Drug use: No     Review of Systems  Constitutional: Negative for fever, chills. Eyes: Negative for visual changes. ENT: Negative for sore throat. Cardiovascular: Negative for chest pain. Respiratory: Negative for shortness of breath. Gastrointestinal: + for nausea, vomiting, abdominal pain.  Genitourinary: Negative for dysuria.  Musculoskeletal: Negative for leg swelling. Skin: Negative for rash. Neurological: Negative for for headaches.   Physical Exam  Vital Signs: ED Triage Vitals  Enc Vitals Group     BP 09/06/19 1316 111/84     Pulse Rate 09/06/19 1316 (!) 134     Resp 09/06/19 1316 18     Temp 09/06/19 1316 98.9 F (37.2 C)     Temp Source 09/06/19 1316 Oral     SpO2 09/06/19 1316 94 %     Weight 09/06/19 1316 160 lb (72.6 kg)     Height 09/06/19 1316 5\' 5"  (1.651 m)     Head Circumference --      Peak Flow --      Pain Score 09/06/19 1324 10     Pain Loc --      Pain Edu? --      Excl. in [place]? --     Constitutional: Alert and oriented.  Appears mildly uncomfortable. Head: Normocephalic. Atraumatic. Eyes: Conjunctivae clear. Sclera anicteric. Nose: No congestion. No rhinorrhea. Mouth/Throat: Mucous membranes are slightly dry.  Neck: No stridor.   Cardiovascular: Normal rate, regular rhythm.  Extremities well perfused. Respiratory: Normal respiratory effort.  Lungs CTAB. Gastrointestinal: Soft.  Focally tender to palpation in the epigastrium, left lower quadrant.  Remainder of abdomen is nontender.   Musculoskeletal: No lower extremity edema. No deformities. Neurologic:  Normal speech and language. No gross focal neurologic deficits are appreciated.  Skin: Skin is warm, dry and intact. No rash noted. Psychiatric: Mood and affect are appropriate for situation.  EKG  N/A    Radiology  CT: IMPRESSION:  1. Acute diverticulitis in the sigmoid colon with evidence of  contained perforation in the sigmoid mesocolon, as above. No  discrete diverticular abscess noted at this time. Surgical  consultation is recommended.  2. Low to intermediate attenuation lesions in the distal body and  tail of the pancreas, stable compared to prior study from 2017,  favored to be benign. Repeat pancreatic protocol CT or abdominal MRI  with and without IV gadolinium with MRCP is recommended in 2 years  to  ensure continued stability. This recommendation follows ACR  consensus guidelines: Management of Incidental Pancreatic Cysts: A  White Paper of the ACR Incidental Findings Committee. J Am Coll  Radiol [id number].  3. Aortic atherosclerosis.  4. Additional incidental findings, as above.    Procedures  Procedure(s) performed (including critical care):  Procedures   Initial Impression / Assessment and Plan / ED Course  53 y.o. female who presents to the ED for epigastric, left lower quadrant abdominal pain, nausea, as above.  Ddx:  diverticulitis, gastritis, pancreatitis, colitis,  Plan: labs, urine, imaging  Labs revealed mild leukocytosis to 14.  Urine negative for infection.  Lipase within normal limits.  CT scan reveals acute diverticulitis of the sigmoid colon, evidence of contained perforation in the sigmoid mesocolon.  Discussed with surgery.  At this time will treat with IV fluids, antibiotics, and close, serial abdominal exams.  Updated patient on results and  plan.  Will admit to surgery.   Final Clinical Impression(s) / ED Diagnosis  Final diagnoses:  Epigastric pain  LLQ abdominal pain  Nausea and vomiting in adult  Diverticulitis       Note:  This document was prepared using Dragon voice recognition software and may include unintentional dictation errors.   Lilia Pro., MD 09/06/19 [phone]

## 2019-09-06 NOTE — ED Notes (Signed)
First Nurse Note: Pt to ED via POV c/o left sided abd pain. Pt is in NAD

## 2019-09-07 ENCOUNTER — Encounter: Payer: Self-pay | Admitting: *Deleted

## 2019-09-07 ENCOUNTER — Inpatient Hospital Stay: Payer: Commercial Managed Care - PPO

## 2019-09-07 LAB — MAGNESIUM: Magnesium: 2.1 mg/dL (ref 1.7–2.4)

## 2019-09-07 LAB — COMPREHENSIVE METABOLIC PANEL
ALT: 11 U/L (ref 0–44)
AST: 10 U/L — ABNORMAL LOW (ref 15–41)
Albumin: 3.3 g/dL — ABNORMAL LOW (ref 3.5–5.0)
Alkaline Phosphatase: 75 U/L (ref 38–126)
Anion gap: 5 (ref 5–15)
BUN: 12 mg/dL (ref 6–20)
CO2: 25 mmol/L (ref 22–32)
Calcium: 7.8 mg/dL — ABNORMAL LOW (ref 8.9–10.3)
Chloride: 108 mmol/L (ref 98–111)
Creatinine, Ser: 0.51 mg/dL (ref 0.44–1.00)
GFR calc Af Amer: >60 mL/min (ref >60)
GFR calc non Af Amer: >60 mL/min (ref >60)
Glucose, Bld: 89 mg/dL (ref 70–99)
Potassium: 4 mmol/L (ref 3.5–5.1)
Sodium: 138 mmol/L (ref 135–145)
Total Bilirubin: 1.2 mg/dL (ref 0.3–1.2)
Total Protein: 5.8 g/dL — ABNORMAL LOW (ref 6.5–8.1)

## 2019-09-07 LAB — CBC
HCT: 40.3 % (ref 36.0–46.0)
Hemoglobin: 12.7 g/dL (ref 12.0–15.0)
MCH: 29.4 pg (ref 26.0–34.0)
MCHC: 31.5 g/dL (ref 30.0–36.0)
MCV: 93.3 fL (ref 80.0–100.0)
Platelets: 164 10*3/uL (ref 150–400)
RBC: 4.32 MIL/uL (ref 3.87–5.11)
RDW: 14.1 % (ref 11.5–15.5)
WBC: 17.7 10*3/uL — ABNORMAL HIGH (ref 4.0–10.5)
nRBC: 0 % (ref 0.0–0.2)

## 2019-09-07 LAB — HIV ANTIBODY (ROUTINE TESTING W REFLEX): HIV Screen 4th Generation wRfx: NONREACTIVE

## 2019-09-07 LAB — PHOSPHORUS: Phosphorus: 2.2 mg/dL — ABNORMAL LOW (ref 2.5–4.6)

## 2019-09-07 MED ORDER — KETOROLAC TROMETHAMINE 30 MG/ML IJ SOLN
30.0000 mg | Freq: Four times a day (QID) | INTRAMUSCULAR | Status: DC
  Administered 2019-09-07 – 2019-09-10 (×12): 30 mg via INTRAVENOUS
  Filled 2019-09-07 (×13): qty 1

## 2019-09-07 MED ORDER — GABAPENTIN 600 MG PO TABS
300.0000 mg | ORAL_TABLET | Freq: Three times a day (TID) | ORAL | Status: DC
  Administered 2019-09-07 – 2019-09-10 (×11): 300 mg via ORAL
  Filled 2019-09-07 (×11): qty 1

## 2019-09-07 MED ORDER — SODIUM CHLORIDE 0.9 % IV SOLN
INTRAVENOUS | Status: DC | PRN
  Administered 2019-09-07 (×2): 250 mL via INTRAVENOUS
  Administered 2019-09-08: 18:00:00 10 mL via INTRAVENOUS
  Administered 2019-09-08: 250 mL via INTRAVENOUS

## 2019-09-07 MED ORDER — HYDROMORPHONE HCL 1 MG/ML IJ SOLN
0.5000 mg | INTRAMUSCULAR | Status: DC | PRN
  Administered 2019-09-07 – 2019-09-08 (×2): 0.5 mg via INTRAVENOUS
  Filled 2019-09-07 (×2): qty 0.5

## 2019-09-07 MED ORDER — IPRATROPIUM-ALBUTEROL 0.5-2.5 (3) MG/3ML IN SOLN
3.0000 mL | Freq: Four times a day (QID) | RESPIRATORY_TRACT | Status: DC | PRN
  Administered 2019-09-07 – 2019-09-10 (×8): 3 mL via RESPIRATORY_TRACT
  Filled 2019-09-07 (×8): qty 3

## 2019-09-07 MED ORDER — CLONIDINE HCL 0.1 MG PO TABS
0.1000 mg | ORAL_TABLET | Freq: Four times a day (QID) | ORAL | Status: DC
  Administered 2019-09-07 – 2019-09-10 (×4): 0.1 mg via ORAL
  Filled 2019-09-07 (×10): qty 1

## 2019-09-07 NOTE — H&P (Signed)
Patient ID: [name], female   DOB: 07-27-1966, 53 y.o.   MRN: [id number]  HPI [name] is a 53 y.o. female with about 24-hour history of abdominal pain that is located in the left lower quadrant.  The pain is sharp intermittent and moderate to severe intensity.  She does have also nausea and did have some vomiting at some point in time.  No fevers no chills.  She is recover alcoholic and had a history of hypertension depression.  She reports that she feels a little bit better as compared to yesterday.  She was recently treated for an upper respiratory infection/bronchitis with a combination of inhalers and p.o. steroids.  She is able to perform more than 4 METS of activity without any shortness of breath or chest pain.  He does use BC powders daily.  CT scan personally reviewed showing evidence of diverticulitis with phlegmon and contained perforation.  There is no overt free air.  White count is 17,700 that has increased as compared to yesterday.  Hemoglobin and CMP are nml. KUB this am shows no free air     .HPI  Past Medical History:  Diagnosis Date  . ADHD (attention deficit hyperactivity disorder)   . Anemia   . Anxiety   . Cancer ([place])    bladder  . Cutaneous lupus erythematosus   . GERD (gastroesophageal reflux disease)   . History of anemia   . Hypertension   . SUI (stress urinary incontinence, female)     Past Surgical History:  Procedure Laterality Date  . BLADDER SUSPENSION N/A 02/13/2013   Procedure: [facility] SLING;  Surgeon: Malka So, MD;  Location: [facility];  Service: Urology;  Laterality: N/A;  . CESAREAN SECTION  12-23-2004    W/ LEFT TUBAL LIGATION AND RIGHT SALPINGECTOMY  . CESAREAN SECTION  1995  . ORIF HUMERUS FRACTURE Right 04/28/2014   Procedure: OPEN REDUCTION INTERNAL FIXATION (ORIF) PROXIMAL HUMERUS FRACTURE;  Surgeon: Nita Sells, MD;  Location: [place];  Service: Orthopedics;  Laterality: Right;  . ORIF PROXIMAL  HUMERUS FRACTURE Right 04/28/2014   DR CHANDLER  . TUBAL LIGATION      No family history on file.  Social History Social History   Tobacco Use  . Smoking status: Current Every Day Smoker    Packs/day: 0.25    Years: 20.00    Pack years: 5.00    Types: Cigarettes  . Smokeless tobacco: Never Used  Substance Use Topics  . Alcohol use: Yes    Comment: was in recovery for 4 months.   . Drug use: No    Allergies  Allergen Reactions  . Codeine Nausea And Vomiting and Other (See Comments)    dizziness  . Vicodin [Hydrocodone-Acetaminophen] Nausea And Vomiting and Other (See Comments)    dizzy  . Hydrocodone Nausea Only    Current Facility-Administered Medications  Medication Dose Route Frequency Provider Last Rate Last Dose  . 0.9 %  sodium chloride infusion   Intravenous Continuous Caroleen Hamman F, MD 150 mL/hr at 09/06/19 2034    . acetaminophen (TYLENOL) tablet 1,000 mg  1,000 mg Oral Q6H ,  F, MD   1,000 mg at 09/07/19 0534  . cloNIDine (CATAPRES) tablet 0.1 mg  0.1 mg Oral QID , [place] F, MD      . enoxaparin (LOVENOX) injection 40 mg  40 mg Subcutaneous Q24H , [place] F, MD   40 mg at 09/06/19 2122  . gabapentin (NEURONTIN) tablet 300  mg  300 mg Oral TID ,  F, MD      . HYDROmorphone (DILAUDID) injection 0.5 mg  0.5 mg Intravenous Q3H PRN ,  F, MD      . ipratropium-albuterol (DUONEB) 0.5-2.5 (3) MG/3ML nebulizer solution 3 mL  3 mL Nebulization Q6H PRN ,  F, MD      . ketorolac (TORADOL) 30 MG/ML injection 30 mg  30 mg Intravenous Q6H ,  F, MD      . ondansetron (ZOFRAN-ODT) disintegrating tablet 4 mg  4 mg Oral Q6H PRN ,  F, MD       Or  . ondansetron (ZOFRAN) injection 4 mg  4 mg Intravenous Q6H PRN ,  F, MD      . oxyCODONE (Oxy IR/ROXICODONE) immediate release tablet 5-10 mg  5-10 mg Oral Q4H PRN Caroleen Hamman F, MD   10 mg at 09/07/19 0253  . piperacillin-tazobactam (ZOSYN) IVPB 3.375 g  3.375  g Intravenous Q8H ,  F, MD 12.5 mL/hr at 09/07/19 0530 3.375 g at 09/07/19 0530  . prochlorperazine (COMPAZINE) tablet 10 mg  10 mg Oral Q6H PRN ,  F, MD       Or  . prochlorperazine (COMPAZINE) injection 5-10 mg  5-10 mg Intravenous Q6H PRN ,  F, MD      . sodium chloride flush (NS) 0.9 % injection 3 mL  3 mL Intravenous Once Lilia Pro., MD         Review of Systems Full ROS  was asked and was negative except for the information on the HPI  Physical Exam Blood pressure (!) 107/56, pulse 74, temperature 98.4 F (36.9 C), temperature source Oral, resp. rate 19, height 5\' 5"  (1.651 m), weight 72.6 kg, SpO2 99 %. CONSTITUTIONAL: NAD EYES: Pupils are equal, round, and reactive to light, Sclera are non-icteric. EARS, NOSE, MOUTH AND THROAT: The oropharynx is clear. The oral mucosa is pink and moist. Hearing is intact to voice. LYMPH NODES:  Lymph nodes in the neck are normal. RESPIRATORY:  Lungs are clear. There is normal respiratory effort, with some wheezing bilaterally, and without pathologic use of accessory muscles. CARDIOVASCULAR: Heart is regular without murmurs, gallops, or rubs. GI: The abdomen is soft, tender to palpation in the left lower quadrant with focal peritonitis.  No overt peritoneal signs.  There is no hepatosplenomegaly. There are normal bowel sounds in all quadrants. GU: Rectal deferred.   MUSCULOSKELETAL: Normal muscle strength and tone. No cyanosis or edema.   SKIN: Turgor is good and there are no pathologic skin lesions or ulcers. NEUROLOGIC: Motor and sensation is grossly normal. Cranial nerves are grossly intact. PSYCH:  Oriented to person, place and time. Affect is normal.  Data Reviewed  I have personally reviewed the patient's imaging, laboratory findings and medical records.    Assessment/Plan 53 year old with diverticulitis with contained perforation.  Her hemodynamics and physiology are appropriate.  She does have some  alarming signs such as increase in the white count and significant inflammatory response.  Her exam is also significant but she does not have any overt peritoneal signs at this time.  I had a lengthy discussion with the patient regarding her disease process.  She wants to avoid any surgical intervention at this time and any colostomy.  I had an extensive discussion with her regarding the failure of medical therapy and diverticulitis, she understands that if she deteriorates I would not have any hesitation in recommending a Hartman's procedure. She is also understanding  and is in agreement with the plan.  We will continue antibiotic therapy fluid resuscitation and serial abdominal exams.  Caroleen Hamman, MD FACS General Surgeon 09/07/2019, 10:00 AM

## 2019-09-08 DIAGNOSIS — K5732 Diverticulitis of large intestine without perforation or abscess without bleeding: Secondary | ICD-10-CM

## 2019-09-08 LAB — BASIC METABOLIC PANEL
Anion gap: 5 (ref 5–15)
BUN: 16 mg/dL (ref 6–20)
CO2: 23 mmol/L (ref 22–32)
Calcium: 7.8 mg/dL — ABNORMAL LOW (ref 8.9–10.3)
Chloride: 111 mmol/L (ref 98–111)
Creatinine, Ser: 0.64 mg/dL (ref 0.44–1.00)
GFR calc Af Amer: >60 mL/min (ref >60)
GFR calc non Af Amer: >60 mL/min (ref >60)
Glucose, Bld: 77 mg/dL (ref 70–99)
Potassium: 3.8 mmol/L (ref 3.5–5.1)
Sodium: 139 mmol/L (ref 135–145)

## 2019-09-08 LAB — CBC
HCT: 34.9 % — ABNORMAL LOW (ref 36.0–46.0)
Hemoglobin: 10.8 g/dL — ABNORMAL LOW (ref 12.0–15.0)
MCH: 29.6 pg (ref 26.0–34.0)
MCHC: 30.9 g/dL (ref 30.0–36.0)
MCV: 95.6 fL (ref 80.0–100.0)
Platelets: 129 10*3/uL — ABNORMAL LOW (ref 150–400)
RBC: 3.65 MIL/uL — ABNORMAL LOW (ref 3.87–5.11)
RDW: 14.2 % (ref 11.5–15.5)
WBC: 11.7 10*3/uL — ABNORMAL HIGH (ref 4.0–10.5)
nRBC: 0 % (ref 0.0–0.2)

## 2019-09-08 MED ORDER — SODIUM CHLORIDE 0.9 % IV BOLUS
1000.0000 mL | Freq: Once | INTRAVENOUS | Status: AC
  Administered 2019-09-08: 21:00:00 1000 mL via INTRAVENOUS

## 2019-09-08 MED ORDER — PIPERACILLIN-TAZOBACTAM 3.375 G IVPB
3.3750 g | Freq: Three times a day (TID) | INTRAVENOUS | Status: DC
  Administered 2019-09-08 – 2019-09-10 (×5): 3.375 g via INTRAVENOUS
  Filled 2019-09-08 (×4): qty 50

## 2019-09-08 NOTE — Progress Notes (Signed)
[facility] SURGICAL PROGRESS NOTE (cpt [phone])  Hospital Day(s): 2.     Interval History: Patient seen and examined, no acute events or new complaints overnight. Patient reports that her LLQ abdominal pain has improved. No fever, chills, nausea, or emesis. Pain only worse with movement, improved with pain medications. No BM. Mobilizing well. Leukocytosis improved to 11K this morning. No other acute issues.  Review of Systems:  Constitutional: denies fever, chills  HEENT: denies cough or congestion  Respiratory: denies any shortness of breath  Cardiovascular: denies chest pain or palpitations  Gastrointestinal: + abdominal pain (improved), denies N/V, or diarrhea/and bowel function as per interval history Genitourinary: denies burning with urination or urinary frequency Musculoskeletal: denies pain, decreased motor or sensation   Vital signs in last 24 hours: [min-max] current  Temp:  [97.7 F (36.5 C)-98.5 F (36.9 C)] 97.7 F (36.5 C) (10/05 0449) Pulse Rate:  [58-73] 58 (10/05 0449) Resp:  [16-20] 20 (10/05 0449) BP: (90-119)/(55-69) 119/65 (10/05 0901) SpO2:  [92 %-93 %] 93 % (10/05 0449)     Height: 5\' 5"  (165.1 cm) Weight: 72.6 kg BMI (Calculated): 26.63   Intake/Output last 2 shifts:  10/04 0701 - 10/05 0700 In: 4458.1 [I.V.:4260.6; IV Piggyback:197.5] Out: -    Physical Exam:  Constitutional: alert, cooperative and no distress  HENT: normocephalic without obvious abnormality  Eyes: PERRL, EOM's grossly intact and symmetric  Respiratory: breathing non-labored at rest  Cardiovascular: regular rate and sinus rhythm  Gastrointestinal: soft, improved LLQ tenderness, and non-distended. No rebound/guarding Musculoskeletal: no edema or wounds, motor and sensation grossly intact, NT    Labs:  CBC Latest Ref Rng & Units 09/08/2019 09/07/2019 09/06/2019  WBC 4.0 - 10.5 K/uL 11.7(H) 17.7(H) 14.4(H)  Hemoglobin 12.0 - 15.0 g/dL 10.8(L) 12.7 15.4(H)  Hematocrit  36.0 - 46.0 % 34.9(L) 40.3 47.2(H)  Platelets 150 - 400 K/uL 129(L) 164 201   CMP Latest Ref Rng & Units 09/08/2019 09/07/2019 09/06/2019  Glucose 70 - 99 mg/dL 77 89 95  BUN 6 - 20 mg/dL 16 12 19   Creatinine 0.44 - 1.00 mg/dL 0.64 0.51 0.65  Sodium 135 - 145 mmol/L 139 138 136  Potassium 3.5 - 5.1 mmol/L 3.8 4.0 3.6  Chloride 98 - 111 mmol/L 111 108 101  CO2 22 - 32 mmol/L 23 25 24   Calcium 8.9 - 10.3 mg/dL 7.8(L) 7.8(L) 9.3  Total Protein 6.5 - 8.1 g/dL - 5.8(L) 7.6  Total Bilirubin 0.3 - 1.2 mg/dL - 1.2 1.4(H)  Alkaline Phos 38 - 126 U/L - 75 107  AST 15 - 41 U/L - 10(L) 14(L)  ALT 0 - 44 U/L - 11 13     Imaging studies: No new pertinent imaging studies   Assessment/Plan: (ICD-10's: K68.32) 53 y.o. female with improving leukocytosis and LLQ abdominal pain attributable to diverticulitis without perforation or abscess.   - Advance to CLD + Wean IVF  - Continue IV Abx (Zosyn --> Day 2)  - pain control prn; antiemetics prn  - monitor abdominal examination, on-going bowel function   - No indication for emergent surgical intervention. She understands that is she were to clinically deteriorate this admission then she would require admission   - mobilization encouraged   - medical management of comorbidities   All of the above findings and recommendations were discussed with the patient, and the medical team, and all of patient's questions were answered to her expressed satisfaction.  -- [place] Simon, PA-C [facility] 09/08/2019, 10:37 AM [phone]  M-F: 7am - 4pm

## 2019-09-09 LAB — CBC
HCT: 34.3 % — ABNORMAL LOW (ref 36.0–46.0)
Hemoglobin: 10.6 g/dL — ABNORMAL LOW (ref 12.0–15.0)
MCH: 29.2 pg (ref 26.0–34.0)
MCHC: 30.9 g/dL (ref 30.0–36.0)
MCV: 94.5 fL (ref 80.0–100.0)
Platelets: 116 10*3/uL — ABNORMAL LOW (ref 150–400)
RBC: 3.63 MIL/uL — ABNORMAL LOW (ref 3.87–5.11)
RDW: 14.6 % (ref 11.5–15.5)
WBC: 9.9 10*3/uL (ref 4.0–10.5)
nRBC: 0 % (ref 0.0–0.2)

## 2019-09-09 LAB — BASIC METABOLIC PANEL
Anion gap: 5 (ref 5–15)
BUN: 16 mg/dL (ref 6–20)
CO2: 22 mmol/L (ref 22–32)
Calcium: 7.6 mg/dL — ABNORMAL LOW (ref 8.9–10.3)
Chloride: 110 mmol/L (ref 98–111)
Creatinine, Ser: 0.58 mg/dL (ref 0.44–1.00)
GFR calc Af Amer: >60 mL/min (ref >60)
GFR calc non Af Amer: >60 mL/min (ref >60)
Glucose, Bld: 81 mg/dL (ref 70–99)
Potassium: 4.4 mmol/L (ref 3.5–5.1)
Sodium: 137 mmol/L (ref 135–145)

## 2019-09-09 NOTE — Progress Notes (Signed)
[facility] SURGICAL PROGRESS NOTE (cpt [phone])  Hospital Day(s): 3.   Interval History: Patient seen and examined, no acute events or new complaints overnight. Patient reports that her LLQ abdominal pain continues to improve. She only notices pain with movement. No issues with fever, chills, nausea, or emesis. She has tolerated CLD without issues. Does note passing flatus and having BM. Mobilizing well. Leukocytosis resolved. No other acute issues.   Review of Systems:  Constitutional: denies fever, chills  HEENT: denies cough or congestion  Respiratory: denies any shortness of breath  Cardiovascular: denies chest pain or palpitations  Gastrointestinal: + abdominal pain (improved), denies N/V, or diarrhea/and bowel function as per interval history Genitourinary: denies burning with urination or urinary frequency Musculoskeletal: denies pain, decreased motor or sensation  Vital signs in last 24 hours: [min-max] current  Temp:  [98 F (36.7 C)-98.6 F (37 C)] 98.3 F (36.8 C) (10/06 0517) Pulse Rate:  [62-76] 76 (10/06 0517) Resp:  [16-18] 18 (10/06 0517) BP: (87-119)/(58-65) 119/62 (10/06 0517) SpO2:  [90 %-93 %] 93 % (10/06 0517)     Height: 5\' 5"  (165.1 cm) Weight: 72.6 kg BMI (Calculated): 26.63   Intake/Output last 2 shifts:  10/05 0701 - 10/06 0700 In: 3237.6 [P.O.:1140; I.V.:2001.5; IV Piggyback:96.1] Out: 100 [Urine:100]   Physical Exam:  Constitutional: alert, cooperative and no distress  HENT: normocephalic without obvious abnormality  Eyes: PERRL, EOM's grossly intact and symmetric  Respiratory: breathing non-labored at rest  Cardiovascular: regular rate and sinus rhythm  Gastrointestinal: soft, improved LLQ tenderness, and non-distended. No rebound/guarding. No peritonitis Musculoskeletal: no edema or wounds, motor and sensation grossly intact, NT    Labs:  CBC Latest Ref Rng & Units 09/09/2019 09/08/2019 09/07/2019  WBC 4.0 - 10.5 K/uL 9.9 11.7(H)  17.7(H)  Hemoglobin 12.0 - 15.0 g/dL 10.6(L) 10.8(L) 12.7  Hematocrit 36.0 - 46.0 % 34.3(L) 34.9(L) 40.3  Platelets 150 - 400 K/uL 116(L) 129(L) 164   CMP Latest Ref Rng & Units 09/09/2019 09/08/2019 09/07/2019  Glucose 70 - 99 mg/dL 81 77 89  BUN 6 - 20 mg/dL 16 16 12   Creatinine 0.44 - 1.00 mg/dL 0.58 0.64 0.51  Sodium 135 - 145 mmol/L 137 139 138  Potassium 3.5 - 5.1 mmol/L 4.4 3.8 4.0  Chloride 98 - 111 mmol/L 110 111 108  CO2 22 - 32 mmol/L 22 23 25   Calcium 8.9 - 10.3 mg/dL 7.6(L) 7.8(L) 7.8(L)  Total Protein 6.5 - 8.1 g/dL - - 5.8(L)  Total Bilirubin 0.3 - 1.2 mg/dL - - 1.2  Alkaline Phos 38 - 126 U/L - - 75  AST 15 - 41 U/L - - 10(L)  ALT 0 - 44 U/L - - 11     Imaging studies: No new pertinent imaging studies   Assessment/Plan: (ICD-10's:  K2.32) 53 y.o. female with improving abdominal pain and resolved leukocytosis attributable to diverticulitis without perforation or abscess.   - Advance diet to full liquids, soft diet for dinner if tolerates   - Continue IV Abx (Zosyn --> Day 3)             - pain control prn; antiemetics prn             - monitor abdominal examination, on-going bowel function              - No indication for emergent surgical intervention. She understands that is she were to clinically deteriorate this admission then she would require admission              -  mobilization encouraged              - medical management of comorbidities    - Discharge Planning: Hopefully home in next 24-48 hours if improving  All of the above findings and recommendations were discussed with the patient, and the medical team, and all of patient's questions were answered to her expressed satisfaction.   -- [place] Simon, PA-C [facility] 09/09/2019, 8:58 AM [phone] M-F: 7am - 4pm

## 2019-09-10 MED ORDER — AMOXICILLIN-POT CLAVULANATE 875-125 MG PO TABS: 1.0000 | ORAL_TABLET | Freq: Two times a day (BID) | ORAL | 0 refills | Status: AC

## 2019-09-10 MED ORDER — IPRATROPIUM-ALBUTEROL 0.5-2.5 (3) MG/3ML IN SOLN: 3.0000 mL | Freq: Four times a day (QID) | RESPIRATORY_TRACT | 0 refills | Status: DC | PRN

## 2019-09-10 MED ORDER — IPRATROPIUM-ALBUTEROL 0.5-2.5 (3) MG/3ML IN SOLN
3.0000 mL | Freq: Four times a day (QID) | RESPIRATORY_TRACT | Status: DC
  Filled 2019-09-10: qty 3

## 2019-09-10 MED ORDER — AMOXICILLIN-POT CLAVULANATE 875-125 MG PO TABS: 1.0000 | ORAL_TABLET | Freq: Two times a day (BID) | ORAL | 0 refills | Status: DC

## 2019-09-10 NOTE — Discharge Summary (Addendum)
[facility] SURGICAL DISCHARGE SUMMARY (cpt: [phone])  Patient ID: [name] MRN: [id number] DOB/AGE: June 04, 1966 53 y.o.  Admit date: 09/06/2019 Discharge date: 09/10/2019  Discharge Diagnoses Patient Active Problem List   Diagnosis Date Noted  . Diverticulitis large intestine 09/06/2019    Consultants None  Procedures None  HPI: [name] is a 53 y.o. female with about 24-hour history of abdominal pain that is located in the left lower quadrant.  The pain is sharp intermittent and moderate to severe intensity.  She does have also nausea and did have some vomiting at some point in time.  No fevers no chills.  She is recover alcoholic and had a history of hypertension depression.  She reports that she feels a little bit better as compared to yesterday.  She was recently treated for an upper respiratory infection/bronchitis with a combination of inhalers and p.o. steroids.  She is able to perform more than 4 METS of activity without any shortness of breath or chest pain.  He does use BC powders daily.  CT scan personally reviewed showing evidence of diverticulitis with phlegmon and contained perforation.  There is no overt free air. White count is 17,700 that has increased as compared to yesterday. Hemoglobin and CMP are nml. KUB this am shows no free air  Hospital Course: She was admitted to the general surgery service and managed conservatively. Her abdominal pain and leukocytosis improved and diet was initiated on hospital day 1. Patient's pain and symptoms improved/resolved and advancement of patient's diet and ambulation were well-tolerated. The remainder of patient's hospital course was essentially unremarkable, and discharge planning was initiated accordingly with patient safely able to be discharged home with appropriate discharge instructions, antibiotics (Augmentin x10 days), pain control, and outpatient follow-up after all of her questions were answered to her  expressed satisfaction.   Discharge Condition: Good   Physical Examination:  Constitutional: alert, cooperative and no distress  HENT: normocephalic without obvious abnormality  Eyes: PERRL, EOM's grossly intact and symmetric  Respiratory: breathing non-labored at rest  Cardiovascular: regular rate and sinus rhythm  Gastrointestinal: soft,non-tender, and non-distended. No rebound/guarding. No peritonitis Musculoskeletal: no edema or wounds, motor and sensation grossly intact, NT    Allergies as of 09/10/2019      Reactions   Codeine Nausea And Vomiting, Other (See Comments)   dizziness   Vicodin [hydrocodone-acetaminophen] Nausea And Vomiting, Other (See Comments)   dizzy   Hydrocodone Nausea Only      Medication List    TAKE these medications   Adzenys XR-ODT 12.5 MG Tbed Generic drug: Amphetamine ER Take 1 tablet by mouth daily.   ALPRAZolam 0.5 MG tablet Commonly known as: XANAX Take 0.5 mg by mouth daily.   amoxicillin-clavulanate 875-125 MG tablet Commonly known as: Augmentin Take 1 tablet by mouth 2 (two) times daily for 10 days.   beclomethasone 80 MCG/ACT inhaler Commonly known as: QVAR Inhale 1 puff into the lungs 2 (two) times daily.   cloNIDine 0.1 MG tablet Commonly known as: CATAPRES Take 0.1 mg by mouth 4 (four) times daily.   FLUoxetine HCl 60 MG Tabs Take 60 mg by mouth daily.   fluticasone 50 MCG/ACT nasal spray Commonly known as: FLONASE Place 2 sprays into the nose daily.   gabapentin 300 MG capsule Commonly known as: NEURONTIN Take 300 mg by mouth 3 (three) times daily.   hydrOXYzine 25 MG capsule Commonly known as: VISTARIL Take 25 mg by mouth 2 (two) times daily as needed for  anxiety.   ipratropium-albuterol 0.5-2.5 (3) MG/3ML Soln Commonly known as: DUONEB Take 3 mLs by nebulization every 6 (six) hours as needed.   predniSONE 10 MG tablet Commonly known as: DELTASONE Take 10 mg by mouth 2 (two) times daily.   ProAir HFA 108  (90 Base) MCG/ACT inhaler Generic drug: albuterol Inhale 2 puffs into the lungs every 4 (four) hours as needed.   progesterone 100 MG capsule Commonly known as: PROMETRIUM Take 200 mg by mouth at bedtime.   traZODone 100 MG tablet Commonly known as: DESYREL Take 100 mg by mouth at bedtime.   Turmeric 500 MG Caps Take 500 mg by mouth daily.   Viibryd 40 MG Tabs Generic drug: Vilazodone HCl Take 40 mg by mouth daily.            Durable Medical Equipment  (From admission, onward)         Start     Ordered   09/10/19 1137  For home use only DME Nebulizer machine  Once    Question Answer Comment  Patient needs a nebulizer to treat with the following condition Shortness of breath   Length of Need Lifetime      09/10/19 1138           Follow-up Information    Pabon, Marjory Lies, MD. Go on 09/24/2019.   Specialty: General Surgery Why: s/p hospitalization for diverticulitis 10:30am appointment Contact information: [address] [place] [address] [phone]            Time spent on discharge management including discussion of hospital course, clinical condition, outpatient instructions, prescriptions, and follow up with the patient and members of the medical team: >30 minutes  -- Edison Simon , PA-C [facility]  09/10/2019, 11:42 AM [phone] M-F: 7am - 4pm

## 2019-09-10 NOTE — Progress Notes (Signed)
[name] to be D/C'dhome per MD order.  Discussed prescriptions and follow up appointments with the patient. Prescriptions given to patient, medication list explained in detail. Pt verbalized understanding.  Allergies as of 09/10/2019      Reactions   Codeine Nausea And Vomiting, Other (See Comments)   dizziness   Vicodin [hydrocodone-acetaminophen] Nausea And Vomiting, Other (See Comments)   dizzy   Hydrocodone Nausea Only      Medication List    TAKE these medications   Adzenys XR-ODT 12.5 MG Tbed Generic drug: Amphetamine ER Take 1 tablet by mouth daily.   ALPRAZolam 0.5 MG tablet Commonly known as: XANAX Take 0.5 mg by mouth daily.   amoxicillin-clavulanate 875-125 MG tablet Commonly known as: Augmentin Take 1 tablet by mouth 2 (two) times daily for 10 days.   beclomethasone 80 MCG/ACT inhaler Commonly known as: QVAR Inhale 1 puff into the lungs 2 (two) times daily.   cloNIDine 0.1 MG tablet Commonly known as: CATAPRES Take 0.1 mg by mouth 4 (four) times daily.   FLUoxetine HCl 60 MG Tabs Take 60 mg by mouth daily.   fluticasone 50 MCG/ACT nasal spray Commonly known as: FLONASE Place 2 sprays into the nose daily.   gabapentin 300 MG capsule Commonly known as: NEURONTIN Take 300 mg by mouth 3 (three) times daily.   hydrOXYzine 25 MG capsule Commonly known as: VISTARIL Take 25 mg by mouth 2 (two) times daily as needed for anxiety.   ipratropium-albuterol 0.5-2.5 (3) MG/3ML Soln Commonly known as: DUONEB Take 3 mLs by nebulization every 6 (six) hours as needed.   predniSONE 10 MG tablet Commonly known as: DELTASONE Take 10 mg by mouth 2 (two) times daily.   ProAir HFA 108 (90 Base) MCG/ACT inhaler Generic drug: albuterol Inhale 2 puffs into the lungs every 4 (four) hours as needed.   progesterone 100 MG capsule Commonly known as: PROMETRIUM Take 200 mg by mouth at bedtime.   traZODone 100 MG tablet Commonly known as: DESYREL Take 100 mg by  mouth at bedtime.   Turmeric 500 MG Caps Take 500 mg by mouth daily.   Viibryd 40 MG Tabs Generic drug: Vilazodone HCl Take 40 mg by mouth daily.            Durable Medical Equipment  (From admission, onward)         Start     Ordered   09/10/19 1137  For home use only DME Nebulizer machine  Once    Question Answer Comment  Patient needs a nebulizer to treat with the following condition Shortness of breath   Length of Need Lifetime      09/10/19 1138          Vitals:   09/09/19 1953 09/10/19 1234  BP: 112/60 127/83  Pulse: 85 84  Resp: 20 20  Temp: 99.2 F (37.3 C) 98.7 F (37.1 C)  SpO2: 93% 95%    Skin clean, dry and intact without evidence of skin break down, no evidence of skin tears noted. IV catheter discontinued intact. Site without signs and symptoms of complications. Dressing and pressure applied. Pt denies pain at this time. No complaints noted.  An After Visit Summary was printed and given to the patient. Patient escorted via [place], and D/C home via private auto.  Shawntia Mangal A Mung Rinker

## 2019-09-17 ENCOUNTER — Telehealth: Payer: Self-pay | Admitting: *Deleted

## 2019-09-17 NOTE — Telephone Encounter (Signed)
Patient was seen in the ER(Diverticulitis) by Dr.Pabon on 09/06/19 she stated that she woke up this morning with pain on the left side she complains of chills this afternoon so she took her temperature and it was 100.3, she is still taking Augmentin twice daily. Please call and advise. No nausea or vomiting, have had some loose stools. Please call and advise

## 2019-09-17 NOTE — Telephone Encounter (Signed)
Spoke with Dr Dahlia Byes and he advised that the patient should go to the ER today for repeat scanning, alternatively, she may have a CT abdomen pelvis with contrast tomorrow and be seen here in the office tomorrow by Edison Simon.    Spoke with the patient and now she says that the pain has gone away. Still a little tender on that side. He temperature is down to 97.9. stools are soft and formed. She does not want to go to the ER unless it is necessary. Patient advised to call us tomorrow with an update on how she is doing. Instructed that if the pain returns or she starts to have a fever again she needs to go to the ER. She understands and is in agreement.

## 2019-09-24 ENCOUNTER — Other Ambulatory Visit: Payer: Self-pay

## 2019-09-24 ENCOUNTER — Ambulatory Visit (INDEPENDENT_AMBULATORY_CARE_PROVIDER_SITE_OTHER): Payer: Commercial Managed Care - PPO | Admitting: Surgery

## 2019-09-24 ENCOUNTER — Encounter: Payer: Self-pay | Admitting: Surgery

## 2019-09-24 VITALS — BP 108/73 | HR 87 | Temp 97.5°F | Resp 14 | Ht 65.0 in | Wt 159.6 lb

## 2019-09-24 DIAGNOSIS — K5792 Diverticulitis of intestine, part unspecified, without perforation or abscess without bleeding: Secondary | ICD-10-CM

## 2019-09-24 NOTE — Patient Instructions (Addendum)
Referral sent to [facility]. Someone from their office will call to schedule the appointment.   Please see your follow up appointment with Dr.Pabon listed below.    Diverticulitis  Diverticulitis is when small pockets in your large intestine (colon) get infected or swollen. This causes stomach pain and watery poop (diarrhea). These pouches are called diverticula. They form in people who have a condition called diverticulosis. Follow these instructions at home: Medicines  Take over-the-counter and prescription medicines only as told by your doctor. These include: ? Antibiotics. ? Pain medicines. ? Fiber pills. ? Probiotics. ? Stool softeners.  Do not drive or use heavy machinery while taking prescription pain medicine.  If you were prescribed an antibiotic, take it as told. Do not stop taking it even if you feel better. General instructions   Follow a diet as told by your doctor.  When you feel better, your doctor may tell you to change your diet. You may need to eat a lot of fiber. Fiber makes it easier to poop (have bowel movements). Healthy foods with fiber include: ? Berries. ? Beans. ? Lentils. ? Green vegetables.  Exercise 3 or more times a week. Aim for 30 minutes each time. Exercise enough to sweat and make your heart beat faster.  Keep all follow-up visits as told. This is important. You may need to have an exam of the large intestine. This is called a colonoscopy. Contact a doctor if:  Your pain does not get better.  You have a hard time eating or drinking.  You are not pooping like normal. Get help right away if:  Your pain gets worse.  Your problems do not get better.  Your problems get worse very fast.  You have a fever.  You throw up (vomit) more than one time.  You have poop that is: ? Bloody. ? Black. ? Tarry. Summary  Diverticulitis is when small pockets in your large intestine (colon) get infected or swollen.  Take  medicines only as told by your doctor.  Follow a diet as told by your doctor. This information is not intended to replace advice given to you by your health care provider. Make sure you discuss any questions you have with your health care provider. Document Released: 05/08/2008 Document Revised: 11/02/2017 Document Reviewed: 12/07/2016 Elsevier Patient Education  2020 [employer].

## 2019-09-25 NOTE — Progress Notes (Signed)
[name] Is a 53 year old female well-known to me with a recent history of perforated diverticulitis managed medically.  She is doing much better.  Taking p.o.  No fevers no chills no abdominal pain.  PE NAd, non toxic Abd: soft, nt, no peritonitis  A/p recent complicated diverticulitis recommend colonoscopy in about 6 weeks and I will follow her as she completed her colonoscopy.  She will benefit from elective sigmoid colectomy.  Discussed with patient in detail about her disease process.  Please note that I spent at least 25 minutes in this encounter with greater than 50% spent in coordination and counseling of her care.  There is no need for emergent surgical intervention at this time

## 2019-10-01 ENCOUNTER — Ambulatory Visit: Payer: Commercial Managed Care - PPO | Admitting: Gastroenterology

## 2019-10-03 ENCOUNTER — Ambulatory Visit
Admission: RE | Admit: 2019-10-03 | Discharge: 2019-10-03 | Disposition: A | Discharge status: Home / Self Care | Payer: Commercial Managed Care - PPO | Source: Ambulatory Visit | Attending: Family Medicine | Admitting: Family Medicine

## 2019-10-03 DIAGNOSIS — Z1231 Encounter for screening mammogram for malignant neoplasm of breast: Secondary | ICD-10-CM | POA: Diagnosis present

## 2019-11-05 ENCOUNTER — Telehealth: Payer: Self-pay

## 2019-11-05 NOTE — Telephone Encounter (Signed)
Spoke with patient to notify her that she was due for her colonoscopy, but it looks like she was going to have it done in [place] at [facility]. Patient says she would prefer to follow up with their office due to her knowing the physician.

## 2020-01-07 DIAGNOSIS — F900 Attention-deficit hyperactivity disorder, predominantly inattentive type: Secondary | ICD-10-CM | POA: Insufficient documentation

## 2020-01-07 DIAGNOSIS — F411 Generalized anxiety disorder: Secondary | ICD-10-CM | POA: Insufficient documentation

## 2020-01-07 DIAGNOSIS — F332 Major depressive disorder, recurrent severe without psychotic features: Secondary | ICD-10-CM | POA: Insufficient documentation

## 2020-01-07 HISTORY — DX: Major depressive disorder, recurrent severe without psychotic features: F33.2

## 2020-02-17 ENCOUNTER — Other Ambulatory Visit: Payer: Self-pay | Admitting: Neurology

## 2020-02-17 DIAGNOSIS — G43119 Migraine with aura, intractable, without status migrainosus: Secondary | ICD-10-CM

## 2020-02-25 DIAGNOSIS — F1021 Alcohol dependence, in remission: Secondary | ICD-10-CM

## 2020-02-25 DIAGNOSIS — G43119 Migraine with aura, intractable, without status migrainosus: Secondary | ICD-10-CM | POA: Diagnosis present

## 2020-02-27 ENCOUNTER — Ambulatory Visit: Payer: Commercial Managed Care - PPO

## 2020-03-24 ENCOUNTER — Other Ambulatory Visit: Payer: Self-pay

## 2020-03-24 ENCOUNTER — Ambulatory Visit
Admission: RE | Admit: 2020-03-24 | Discharge: 2020-03-24 | Disposition: A | Discharge status: Home / Self Care | Payer: Commercial Managed Care - PPO | Source: Ambulatory Visit | Attending: Neurology | Admitting: Neurology

## 2020-03-24 DIAGNOSIS — G43119 Migraine with aura, intractable, without status migrainosus: Secondary | ICD-10-CM | POA: Insufficient documentation

## 2020-03-24 MED ORDER — GADOBUTROL 1 MMOL/ML IV SOLN
7.0000 mL | Freq: Once | INTRAVENOUS | Status: AC | PRN
  Administered 2020-03-24: 7 mL via INTRAVENOUS

## 2020-04-03 ENCOUNTER — Encounter (HOSPITAL_COMMUNITY): Payer: Self-pay | Admitting: Emergency Medicine

## 2020-04-03 ENCOUNTER — Emergency Department (HOSPITAL_COMMUNITY): Payer: Commercial Managed Care - PPO

## 2020-04-03 ENCOUNTER — Other Ambulatory Visit: Payer: Self-pay

## 2020-04-03 ENCOUNTER — Inpatient Hospital Stay (HOSPITAL_COMMUNITY)
Admission: EM | Admit: 2020-04-03 | Discharge: 2020-04-12 | DRG: 391 | Disposition: A | Discharge status: Home / Self Care | Payer: Commercial Managed Care - PPO | Attending: Surgery | Admitting: Surgery

## 2020-04-03 DIAGNOSIS — F900 Attention-deficit hyperactivity disorder, predominantly inattentive type: Secondary | ICD-10-CM | POA: Diagnosis present

## 2020-04-03 DIAGNOSIS — Z862 Personal history of diseases of the blood and blood-forming organs and certain disorders involving the immune mechanism: Secondary | ICD-10-CM

## 2020-04-03 DIAGNOSIS — G43119 Migraine with aura, intractable, without status migrainosus: Secondary | ICD-10-CM | POA: Diagnosis present

## 2020-04-03 DIAGNOSIS — Z79891 Long term (current) use of opiate analgesic: Secondary | ICD-10-CM

## 2020-04-03 DIAGNOSIS — Z20822 Contact with and (suspected) exposure to covid-19: Secondary | ICD-10-CM | POA: Diagnosis present

## 2020-04-03 DIAGNOSIS — F1020 Alcohol dependence, uncomplicated: Secondary | ICD-10-CM | POA: Diagnosis present

## 2020-04-03 DIAGNOSIS — R197 Diarrhea, unspecified: Secondary | ICD-10-CM

## 2020-04-03 DIAGNOSIS — K651 Peritoneal abscess: Secondary | ICD-10-CM | POA: Diagnosis present

## 2020-04-03 DIAGNOSIS — F411 Generalized anxiety disorder: Secondary | ICD-10-CM | POA: Diagnosis present

## 2020-04-03 DIAGNOSIS — N393 Stress incontinence (female) (male): Secondary | ICD-10-CM | POA: Diagnosis present

## 2020-04-03 DIAGNOSIS — Z8551 Personal history of malignant neoplasm of bladder: Secondary | ICD-10-CM

## 2020-04-03 DIAGNOSIS — R918 Other nonspecific abnormal finding of lung field: Secondary | ICD-10-CM | POA: Diagnosis present

## 2020-04-03 DIAGNOSIS — I10 Essential (primary) hypertension: Secondary | ICD-10-CM | POA: Diagnosis present

## 2020-04-03 DIAGNOSIS — Z87891 Personal history of nicotine dependence: Secondary | ICD-10-CM | POA: Diagnosis not present

## 2020-04-03 DIAGNOSIS — Z79899 Other long term (current) drug therapy: Secondary | ICD-10-CM

## 2020-04-03 DIAGNOSIS — K219 Gastro-esophageal reflux disease without esophagitis: Secondary | ICD-10-CM | POA: Diagnosis present

## 2020-04-03 DIAGNOSIS — Z885 Allergy status to narcotic agent status: Secondary | ICD-10-CM

## 2020-04-03 DIAGNOSIS — L0291 Cutaneous abscess, unspecified: Secondary | ICD-10-CM

## 2020-04-03 DIAGNOSIS — F329 Major depressive disorder, single episode, unspecified: Secondary | ICD-10-CM | POA: Diagnosis present

## 2020-04-03 DIAGNOSIS — K5720 Diverticulitis of large intestine with perforation and abscess without bleeding: Principal | ICD-10-CM | POA: Diagnosis present

## 2020-04-03 DIAGNOSIS — K862 Cyst of pancreas: Secondary | ICD-10-CM | POA: Diagnosis present

## 2020-04-03 DIAGNOSIS — F1021 Alcohol dependence, in remission: Secondary | ICD-10-CM

## 2020-04-03 DIAGNOSIS — R110 Nausea: Secondary | ICD-10-CM

## 2020-04-03 DIAGNOSIS — F909 Attention-deficit hyperactivity disorder, unspecified type: Secondary | ICD-10-CM | POA: Diagnosis present

## 2020-04-03 DIAGNOSIS — F332 Major depressive disorder, recurrent severe without psychotic features: Secondary | ICD-10-CM | POA: Diagnosis present

## 2020-04-03 DIAGNOSIS — L930 Discoid lupus erythematosus: Secondary | ICD-10-CM | POA: Diagnosis present

## 2020-04-03 DIAGNOSIS — R1032 Left lower quadrant pain: Secondary | ICD-10-CM

## 2020-04-03 LAB — DIFFERENTIAL
Abs Immature Granulocytes: 0.04 10*3/uL (ref 0.00–0.07)
Basophils Absolute: 0.1 10*3/uL (ref 0.0–0.1)
Basophils Relative: 0 %
Eosinophils Absolute: 0.2 10*3/uL (ref 0.0–0.5)
Eosinophils Relative: 2 %
Immature Granulocytes: 0 %
Lymphocytes Relative: 8 %
Lymphs Abs: 1.2 10*3/uL (ref 0.7–4.0)
Monocytes Absolute: 0.8 10*3/uL (ref 0.1–1.0)
Monocytes Relative: 5 %
Neutro Abs: 12.6 10*3/uL — ABNORMAL HIGH (ref 1.7–7.7)
Neutrophils Relative %: 85 %

## 2020-04-03 LAB — CBC
HCT: 42.6 % (ref 36.0–46.0)
Hemoglobin: 13.3 g/dL (ref 12.0–15.0)
MCH: 29.3 pg (ref 26.0–34.0)
MCHC: 31.2 g/dL (ref 30.0–36.0)
MCV: 93.8 fL (ref 80.0–100.0)
Platelets: 233 10*3/uL (ref 150–400)
RBC: 4.54 MIL/uL (ref 3.87–5.11)
RDW: 14.3 % (ref 11.5–15.5)
WBC: 14.9 10*3/uL — ABNORMAL HIGH (ref 4.0–10.5)
nRBC: 0 % (ref 0.0–0.2)

## 2020-04-03 LAB — COMPREHENSIVE METABOLIC PANEL
ALT: 16 U/L (ref 0–44)
AST: 16 U/L (ref 15–41)
Albumin: 3.6 g/dL (ref 3.5–5.0)
Alkaline Phosphatase: 84 U/L (ref 38–126)
Anion gap: 7 (ref 5–15)
BUN: 23 mg/dL — ABNORMAL HIGH (ref 6–20)
CO2: 20 mmol/L — ABNORMAL LOW (ref 22–32)
Calcium: 7.7 mg/dL — ABNORMAL LOW (ref 8.9–10.3)
Chloride: 111 mmol/L (ref 98–111)
Creatinine, Ser: 0.6 mg/dL (ref 0.44–1.00)
GFR calc Af Amer: >60 mL/min (ref >60)
GFR calc non Af Amer: >60 mL/min (ref >60)
Glucose, Bld: 73 mg/dL (ref 70–99)
Potassium: 3.7 mmol/L (ref 3.5–5.1)
Sodium: 138 mmol/L (ref 135–145)
Total Bilirubin: 0.4 mg/dL (ref 0.3–1.2)
Total Protein: 6.3 g/dL — ABNORMAL LOW (ref 6.5–8.1)

## 2020-04-03 LAB — RESPIRATORY PANEL BY RT PCR (FLU A&B, COVID)
Influenza A by PCR: NEGATIVE
Influenza B by PCR: NEGATIVE
SARS Coronavirus 2 by RT PCR: NEGATIVE

## 2020-04-03 LAB — URINALYSIS, ROUTINE W REFLEX MICROSCOPIC
Bacteria, UA: NONE SEEN
Bilirubin Urine: NEGATIVE
Glucose, UA: NEGATIVE mg/dL
Hgb urine dipstick: NEGATIVE
Ketones, ur: NEGATIVE mg/dL
Nitrite: NEGATIVE
Protein, ur: NEGATIVE mg/dL
Specific Gravity, Urine: >1.046 — ABNORMAL HIGH (ref 1.005–1.030)
pH: 5 (ref 5.0–8.0)

## 2020-04-03 LAB — LIPASE, BLOOD: Lipase: 24 U/L (ref 11–51)

## 2020-04-03 LAB — LACTIC ACID, PLASMA: Lactic Acid, Venous: 1.1 mmol/L (ref 0.5–1.9)

## 2020-04-03 MED ORDER — ACETAMINOPHEN 325 MG PO TABS
650.0000 mg | ORAL_TABLET | Freq: Once | ORAL | Status: AC
  Administered 2020-04-03: 650 mg via ORAL
  Filled 2020-04-03: qty 2

## 2020-04-03 MED ORDER — AMPHETAMINE ER 12.5 MG PO TBED: 1.0000 | EXTENDED_RELEASE_TABLET | Freq: Every day | ORAL | Status: DC

## 2020-04-03 MED ORDER — MORPHINE SULFATE (PF) 4 MG/ML IV SOLN
4.0000 mg | Freq: Once | INTRAVENOUS | Status: AC
  Administered 2020-04-03: 13:00:00 4 mg via INTRAVENOUS
  Filled 2020-04-03: qty 1

## 2020-04-03 MED ORDER — CLONIDINE HCL 0.1 MG PO TABS
0.1000 mg | ORAL_TABLET | Freq: Four times a day (QID) | ORAL | Status: DC
  Administered 2020-04-06: 0.1 mg via ORAL
  Filled 2020-04-03 (×9): qty 1

## 2020-04-03 MED ORDER — ONDANSETRON HCL 4 MG/2ML IJ SOLN
4.0000 mg | Freq: Once | INTRAMUSCULAR | Status: AC
  Administered 2020-04-03: 4 mg via INTRAVENOUS
  Filled 2020-04-03: qty 2

## 2020-04-03 MED ORDER — SODIUM CHLORIDE 0.9 % IV BOLUS
500.0000 mL | Freq: Once | INTRAVENOUS | Status: AC
  Administered 2020-04-03: 06:00:00 500 mL via INTRAVENOUS

## 2020-04-03 MED ORDER — IOHEXOL 300 MG/ML  SOLN
100.0000 mL | Freq: Once | INTRAMUSCULAR | Status: AC | PRN
  Administered 2020-04-03: 100 mL via INTRAVENOUS

## 2020-04-03 MED ORDER — HYDROXYZINE PAMOATE 25 MG PO CAPS
25.0000 mg | ORAL_CAPSULE | Freq: Two times a day (BID) | ORAL | Status: DC | PRN
  Filled 2020-04-03: qty 1

## 2020-04-03 MED ORDER — SODIUM CHLORIDE 0.9% FLUSH
3.0000 mL | Freq: Once | INTRAVENOUS | Status: AC
  Administered 2020-04-03: 3 mL via INTRAVENOUS

## 2020-04-03 MED ORDER — PIPERACILLIN-TAZOBACTAM 3.375 G IVPB
3.3750 g | Freq: Three times a day (TID) | INTRAVENOUS | Status: DC
  Administered 2020-04-03 – 2020-04-12 (×27): 3.375 g via INTRAVENOUS
  Filled 2020-04-03 (×28): qty 50

## 2020-04-03 MED ORDER — GABAPENTIN 300 MG PO CAPS: 300.0000 mg | ORAL_CAPSULE | Freq: Three times a day (TID) | ORAL | Status: DC

## 2020-04-03 MED ORDER — METRONIDAZOLE IN NACL 5-0.79 MG/ML-% IV SOLN: 500.0000 mg | Freq: Once | INTRAVENOUS | Status: DC

## 2020-04-03 MED ORDER — ACETAMINOPHEN 650 MG RE SUPP: 650.0000 mg | Freq: Four times a day (QID) | RECTAL | Status: DC | PRN

## 2020-04-03 MED ORDER — HYDROMORPHONE HCL 1 MG/ML IJ SOLN
0.5000 mg | Freq: Once | INTRAMUSCULAR | Status: AC
  Administered 2020-04-03: 08:00:00 0.5 mg via INTRAVENOUS
  Filled 2020-04-03: qty 1

## 2020-04-03 MED ORDER — LIP MEDEX EX OINT
TOPICAL_OINTMENT | CUTANEOUS | Status: AC
  Administered 2020-04-03: 1
  Filled 2020-04-03: qty 7

## 2020-04-03 MED ORDER — PIPERACILLIN-TAZOBACTAM 3.375 G IVPB 30 MIN
3.3750 g | Freq: Once | INTRAVENOUS | Status: AC
  Administered 2020-04-03: 3.375 g via INTRAVENOUS
  Filled 2020-04-03: qty 50

## 2020-04-03 MED ORDER — ONDANSETRON HCL 4 MG/2ML IJ SOLN
4.0000 mg | Freq: Four times a day (QID) | INTRAMUSCULAR | Status: DC | PRN
  Administered 2020-04-03 – 2020-04-09 (×5): 4 mg via INTRAVENOUS
  Filled 2020-04-03 (×6): qty 2

## 2020-04-03 MED ORDER — BECLOMETHASONE DIPROP HFA 80 MCG/ACT IN AERB: 1.0000 | INHALATION_SPRAY | Freq: Two times a day (BID) | RESPIRATORY_TRACT | Status: DC

## 2020-04-03 MED ORDER — MORPHINE SULFATE (PF) 4 MG/ML IV SOLN
4.0000 mg | Freq: Once | INTRAVENOUS | Status: AC
  Administered 2020-04-03: 4 mg via INTRAVENOUS
  Filled 2020-04-03: qty 1

## 2020-04-03 MED ORDER — ONDANSETRON 4 MG PO TBDP
4.0000 mg | ORAL_TABLET | Freq: Four times a day (QID) | ORAL | Status: DC | PRN
  Administered 2020-04-05 – 2020-04-11 (×4): 4 mg via ORAL
  Filled 2020-04-03 (×4): qty 1

## 2020-04-03 MED ORDER — BUDESONIDE 0.5 MG/2ML IN SUSP
2.0000 mL | Freq: Every day | RESPIRATORY_TRACT | Status: DC
  Administered 2020-04-03 – 2020-04-12 (×10): 0.5 mg via RESPIRATORY_TRACT
  Filled 2020-04-03 (×9): qty 2

## 2020-04-03 MED ORDER — SODIUM CHLORIDE (PF) 0.9 % IJ SOLN
INTRAMUSCULAR | Status: AC
  Filled 2020-04-03: qty 50

## 2020-04-03 MED ORDER — PROGESTERONE MICRONIZED 100 MG PO CAPS: 200.0000 mg | ORAL_CAPSULE | Freq: Every day | ORAL | Status: DC

## 2020-04-03 MED ORDER — ACETAMINOPHEN 325 MG PO TABS
650.0000 mg | ORAL_TABLET | Freq: Four times a day (QID) | ORAL | Status: DC | PRN
  Administered 2020-04-04: 650 mg via ORAL
  Filled 2020-04-03: qty 2

## 2020-04-03 MED ORDER — CIPROFLOXACIN IN D5W 400 MG/200ML IV SOLN: 400.0000 mg | Freq: Once | INTRAVENOUS | Status: DC

## 2020-04-03 MED ORDER — HYDROMORPHONE HCL 1 MG/ML IJ SOLN
1.0000 mg | INTRAMUSCULAR | Status: DC | PRN
  Administered 2020-04-03 – 2020-04-12 (×24): 1 mg via INTRAVENOUS
  Filled 2020-04-03 (×24): qty 1

## 2020-04-03 MED ORDER — TRAZODONE HCL 100 MG PO TABS
100.0000 mg | ORAL_TABLET | Freq: Every day | ORAL | Status: DC
  Administered 2020-04-05 – 2020-04-11 (×7): 100 mg via ORAL
  Filled 2020-04-03 (×8): qty 1

## 2020-04-03 MED ORDER — GABAPENTIN 300 MG PO CAPS
900.0000 mg | ORAL_CAPSULE | Freq: Every day | ORAL | Status: DC
  Administered 2020-04-04 – 2020-04-11 (×8): 900 mg via ORAL
  Filled 2020-04-03 (×9): qty 3

## 2020-04-03 MED ORDER — FLUOXETINE HCL 20 MG PO CAPS
60.0000 mg | ORAL_CAPSULE | Freq: Every day | ORAL | Status: DC
  Administered 2020-04-04 – 2020-04-12 (×9): 60 mg via ORAL
  Filled 2020-04-03 (×9): qty 3

## 2020-04-03 MED ORDER — TOPIRAMATE 25 MG PO TABS
50.0000 mg | ORAL_TABLET | Freq: Every day | ORAL | Status: DC
  Administered 2020-04-04 – 2020-04-12 (×9): 50 mg via ORAL
  Filled 2020-04-03 (×9): qty 2

## 2020-04-03 MED ORDER — HYDROMORPHONE HCL 1 MG/ML IJ SOLN
0.5000 mg | Freq: Once | INTRAMUSCULAR | Status: AC
  Administered 2020-04-03: 0.5 mg via INTRAVENOUS
  Filled 2020-04-03: qty 1

## 2020-04-03 MED ORDER — ENOXAPARIN SODIUM 40 MG/0.4ML ~~LOC~~ SOLN
40.0000 mg | SUBCUTANEOUS | Status: DC
  Administered 2020-04-03 – 2020-04-09 (×7): 40 mg via SUBCUTANEOUS
  Filled 2020-04-03 (×7): qty 0.4

## 2020-04-03 MED ORDER — GABAPENTIN 300 MG PO CAPS
300.0000 mg | ORAL_CAPSULE | Freq: Three times a day (TID) | ORAL | Status: DC
  Administered 2020-04-04 – 2020-04-12 (×21): 300 mg via ORAL
  Filled 2020-04-03 (×24): qty 1

## 2020-04-03 MED ORDER — OXYCODONE HCL 5 MG PO TABS
5.0000 mg | ORAL_TABLET | ORAL | Status: DC | PRN
  Administered 2020-04-03 (×2): 5 mg via ORAL
  Administered 2020-04-04 – 2020-04-05 (×5): 10 mg via ORAL
  Administered 2020-04-05: 5 mg via ORAL
  Administered 2020-04-05 – 2020-04-12 (×18): 10 mg via ORAL
  Filled 2020-04-03 (×4): qty 2
  Filled 2020-04-03: qty 1
  Filled 2020-04-03 (×6): qty 2
  Filled 2020-04-03: qty 1
  Filled 2020-04-03 (×12): qty 2
  Filled 2020-04-03: qty 1
  Filled 2020-04-03: qty 2

## 2020-04-03 MED ORDER — KCL IN DEXTROSE-NACL 20-5-0.45 MEQ/L-%-% IV SOLN
INTRAVENOUS | Status: DC
  Filled 2020-04-03 (×18): qty 1000

## 2020-04-03 NOTE — ED Provider Notes (Signed)
[place] DEPT Provider Note   CSN: [id number] Arrival date & time: 04/03/20  0522     History Chief Complaint  Patient presents with  . Abdominal Pain    [name] is a 54 y.o. female with pertinent past medical history of diverticulitis, GERD, hypertension, alcohol abuse -is now sober that presents to the emergency department today for left lower quadrant pain and left lower flank pain that started a couple hours ago.  She states that she had diarrhea last night.  She denies any melena or hematochezia.  She states this episode feels like last time she had a perforated bowel due to diverticulitis.  She has not taken anything for pain.  She states that she was told by surgery a couple months ago that she needs to get  surgery for her diverticulitis, however she has not been able to get that yet.  She admits to some nausea and feeling hot and sweaty.  She denies any vomiting, fevers, chills, chest pain, shortness of breath, dizziness, headache, leg swelling.  She denies any current alcohol use, IV drug use, tobacco use.  She states that she has not had an appetite recently.  She states that she has been urinating normally -denies any dysuria or hematuria.  She states that she has not had her period in over 10 years.  HPI     Past Medical History:  Diagnosis Date  . ADHD (attention deficit hyperactivity disorder)   . Anemia   . Anxiety   . Cancer ([place])    bladder  . Cutaneous lupus erythematosus   . GERD (gastroesophageal reflux disease)   . History of anemia   . Hypertension   . SUI (stress urinary incontinence, female)     Patient Active Problem List   Diagnosis Date Noted  . Diverticulitis large intestine 09/06/2019  . Severe episode of recurrent major depressive disorder, without psychotic features ([place])   . Alcohol dependence with acute alcoholic intoxication with complication ([place]) [id number]  . MDD (major depressive disorder) 10/07/2015    . Attention deficit hyperactivity disorder (ADHD), predominantly inattentive type   . GAD (generalized anxiety disorder)   . 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 BLADDER SUSPENSION N/A 02/13/2013   Procedure: [facility] SLING;  Surgeon: Malka So, MD;  Location: [facility];  Service: Urology;  Laterality: N/A;  . CESAREAN SECTION  12-23-2004    W/ LEFT TUBAL LIGATION AND RIGHT SALPINGECTOMY  . CESAREAN SECTION  1995  . ORIF HUMERUS FRACTURE Right 04/28/2014   Procedure: OPEN REDUCTION INTERNAL FIXATION (ORIF) PROXIMAL HUMERUS FRACTURE;  Surgeon: Nita Sells, MD;  Location: [place];  Service: Orthopedics;  Laterality: Right;  . ORIF PROXIMAL HUMERUS FRACTURE Right 04/28/2014   DR CHANDLER  . TUBAL LIGATION       OB History   No obstetric history on file.     Family History  Adopted: Yes    Social History   Tobacco Use  . Smoking status: Former Smoker    Packs/day: 0.25    Years: 20.00    Pack years: 5.00    Types: Cigarettes    Quit date: 09/24/2019    Years since quitting: 0.5  . Smokeless tobacco: Never Used  Substance Use Topics  . Alcohol use: Yes    Comment: was in recovery for 4 months.   . Drug use: No    Home Medications  Prior to Admission medications   Medication Sig Start Date End Date Taking? Authorizing Provider  ADZENYS XR-ODT 12.5 MG TBED Take 1 tablet by mouth daily.  11/28/18   [provider]  beclomethasone (QVAR) 80 MCG/ACT inhaler Inhale 1 puff into the lungs 2 (two) times daily. 08/27/19 08/26/20  [provider]  cholecalciferol (VITAMIN D3) 25 MCG (1000 UT) tablet Take 1,000 Units by mouth daily.    [provider]  cloNIDine (CATAPRES) 0.1 MG tablet Take 0.1 mg by mouth 4 (four) times daily.    [provider]  FLUoxetine HCl 60 MG TABS Take 60 mg by mouth daily. 08/25/19   [provider]  fluticasone  (FLONASE) 50 MCG/ACT nasal spray Place 2 sprays into the nose daily. 01/28/19 01/28/20  [provider]  gabapentin (NEURONTIN) 300 MG capsule Take 300 mg by mouth 3 (three) times daily.    [provider]  hydrOXYzine (VISTARIL) 25 MG capsule Take 25 mg by mouth 2 (two) times daily as needed for anxiety. 08/07/19   [provider]  ipratropium-albuterol (DUONEB) 0.5-2.5 (3) MG/3ML SOLN Take 3 mLs by nebulization every 6 (six) hours as needed. 09/10/19   Tylene Fantasia, PA-C  PROAIR HFA 108 [id number] Base) MCG/ACT inhaler Inhale 2 puffs into the lungs every 4 (four) hours as needed.  11/24/18   [provider]  progesterone (PROMETRIUM) 100 MG capsule Take 200 mg by mouth at bedtime. 08/25/19   [provider]  traZODone (DESYREL) 100 MG tablet Take 100 mg by mouth at bedtime.    [provider]  Turmeric 500 MG CAPS Take 500 mg by mouth daily.    [provider]    Allergies    Codeine, Hydrocodone-acetaminophen, Vicodin [hydrocodone-acetaminophen], and Hydrocodone  Review of Systems   Review of Systems  Constitutional: Positive for diaphoresis. Negative for chills, fatigue and fever.  HENT: Negative for congestion, sore throat and trouble swallowing.   Eyes: Negative for pain and visual disturbance.  Respiratory: Negative for cough, shortness of breath and wheezing.   Cardiovascular: Negative for chest pain, palpitations and leg swelling.  Gastrointestinal: Positive for abdominal pain (LLQ pain and flank pain), diarrhea and nausea. Negative for abdominal distention and vomiting.  Genitourinary: Negative for difficulty urinating.  Musculoskeletal: Negative for back pain, neck pain and neck stiffness.  Skin: Negative for pallor.  Neurological: Negative for dizziness, speech difficulty, weakness and headaches.  Psychiatric/Behavioral: Negative for confusion.    Physical Exam Updated Vital Signs BP (!) 136/94 (BP Location: Left Arm)    Pulse (!) 105   Temp 98.1 F (36.7 C) (Oral)   Resp 20   Ht 5\' 5"  (1.651 m)   Wt 67.1 kg   SpO2 96%   BMI 24.63 kg/m   Physical Exam Constitutional:      General: She is not in acute distress.    Appearance: Normal appearance. She is ill-appearing and diaphoretic. She is not toxic-appearing.  HENT:     Mouth/Throat:     Mouth: Mucous membranes are moist.     Pharynx: Oropharynx is clear.  Eyes:     General: No scleral icterus.    Extraocular Movements: Extraocular movements intact.     Pupils: Pupils are equal, round, and reactive to light.  Cardiovascular:     Rate and Rhythm: Normal rate and regular rhythm.     Pulses: Normal pulses.     Heart sounds: Normal heart sounds.  Pulmonary:     Effort: Pulmonary effort  is normal. No respiratory distress.     Breath sounds: Normal breath sounds. No stridor. No wheezing, rhonchi or rales.  Chest:     Chest wall: No tenderness.  Abdominal:     General: Abdomen is flat. Bowel sounds are normal. There is no distension.     Palpations: Abdomen is soft.     Tenderness: There is abdominal tenderness (Tenderness wraps around to left lower flank) in the left lower quadrant. There is guarding. There is no right CVA tenderness, left CVA tenderness or rebound. Negative signs include Murphy's sign, Rovsing's sign and McBurney's sign.  Musculoskeletal:        General: No swelling or tenderness. Normal range of motion.     Cervical back: Normal range of motion and neck supple. No rigidity.     Right lower leg: No edema.     Left lower leg: No edema.  Skin:    General: Skin is warm.     Capillary Refill: Capillary refill takes less than 2 seconds.     Coloration: Skin is not pale.  Neurological:     General: No focal deficit present.     Mental Status: She is alert and oriented to person, place, and time.  Psychiatric:        Mood and Affect: Mood normal.        Behavior: Behavior normal.     ED Results / Procedures / Treatments     Labs (all labs ordered are listed, but only abnormal results are displayed) Labs Reviewed  CBC - Abnormal; Notable for the following components:      Result Value   WBC 14.9 (*)    All other components within normal limits  LIPASE, BLOOD  COMPREHENSIVE METABOLIC PANEL  URINALYSIS, ROUTINE W REFLEX MICROSCOPIC  LACTIC ACID, PLASMA  DIFFERENTIAL    EKG None  Radiology No results found.  Procedures Procedures (including critical care time)  Medications Ordered in ED Medications  sodium chloride flush (NS) 0.9 % injection 3 mL (3 mLs Intravenous Given 04/03/20 0621)  ondansetron (ZOFRAN) injection 4 mg (4 mg Intravenous Given 04/03/20 0620)  HYDROmorphone (DILAUDID) injection 0.5 mg (0.5 mg Intravenous Given 04/03/20 0623)  sodium chloride 0.9 % bolus 500 mL (500 mLs Intravenous New Bag/Given 04/03/20 0620)    ED Course  I have reviewed the triage vital signs and the nursing notes.  Pertinent labs & imaging results that were available during my care of the patient were reviewed by me and considered in my medical decision making (see chart for details).    MDM Rules/Calculators/A&P                      ATAYA CACAL is a 54 y.o. female with pertinent past medical history of diverticulitis, GERD, hypertension, alcohol abuse -is now sober that presents to the emergency department today for left lower quadrant pain and left lower flank pain that started a couple hours ago.  Patient is ill-appearing with diaphoresis, guarding towards left lower quadrant and left flank.  No rebound tenderness.  Patient states that this episode feels like the last time her bowel was perforated due to diverticulitis.  CT abdomen ordered.  Blood work to include CBC, CMP, lipase, lactic acid.  UA ordered.  Dilaudid given for pain.   Pt care was handed off to [facility] at [phone].  Complete history and physical and current plan have been communicated.  Only lab that is back is CBC which shows  leukocytosis at 14.9.  Please refer to their note for the remainder of ED care and ultimate disposition.  Final Clinical Impression(s) / ED Diagnoses Final diagnoses:  Left lower quadrant abdominal pain  Nausea  Diarrhea, unspecified type    Rx / DC Orders ED Discharge Orders    None       Alfredia Client, PA-C 04/03/20 [place], Delice Bison, DO 04/03/20 [phone]

## 2020-04-03 NOTE — H&P (Signed)
[name] is an 54 y.o. female.    General Surgery [facility]  Chief Complaint: abdominal pain, acute diverticulitis  HPI: Patient is a 54 year old female who presents to the emergency room with less than 24-hour history of abdominal pain and diarrhea.  Patient has a history of diverticular disease and was admitted with acute diverticulitis last year.  Patient developed pain which is localized in the left mid abdomen in the left lower quadrant.  Patient was evaluated in the emergency department.  White blood cell count is elevated at 14.9.  CT scan of abdomen and pelvis shows findings consistent with acute diverticulitis of the distal descending colon with an associated 1.8 cm abscess.  General surgery is now called for evaluation, admission, and management.  Patient was admitted last year on the medical service and was treated with intravenous antibiotics successfully.  She did follow-up with her gastroenterologist, Dr. Ronald Lobo, and underwent colonoscopy.  This will be the patient's second hospitalization and fourth overall episode of diverticulitis.  Previous abdominal surgery includes cesarean section.  She is also had a bladder sling procedure performed.  Past Medical History:  Diagnosis Date  . ADHD (attention deficit hyperactivity disorder)   . Anemia   . Anxiety   . Cancer ([place])    bladder  . Cutaneous lupus erythematosus   . GERD (gastroesophageal reflux disease)   . History of anemia   . Hypertension   . SUI (stress urinary incontinence, female)     Past Surgical History:  Procedure Laterality Date  . BLADDER SUSPENSION N/A 02/13/2013   Procedure: [facility] SLING;  Surgeon: Malka So, MD;  Location: [facility];  Service: Urology;  Laterality: N/A;  . CESAREAN SECTION  12-23-2004    W/ LEFT TUBAL LIGATION AND RIGHT SALPINGECTOMY  . CESAREAN SECTION  1995  . ORIF HUMERUS FRACTURE Right 04/28/2014   Procedure: OPEN REDUCTION  INTERNAL FIXATION (ORIF) PROXIMAL HUMERUS FRACTURE;  Surgeon: Nita Sells, MD;  Location: [facility];  Service: Orthopedics;  Laterality: Right;  . ORIF PROXIMAL HUMERUS FRACTURE Right 04/28/2014   DR CHANDLER  . TUBAL LIGATION      Family History  Adopted: Yes   Social History:  reports that she quit smoking about 6 months ago. Her smoking use included cigarettes. She has a 5.00 pack-year smoking history. She has never used smokeless tobacco. She reports current alcohol use. She reports that she does not use drugs.  Allergies:  Allergies  Allergen Reactions  . Codeine Nausea And Vomiting and Other (See Comments)    dizziness Other reaction(s): Dizziness, Other (See Comments) dizziness  . Hydrocodone-Acetaminophen Nausea And Vomiting    Other reaction(s): Other (See Comments) dizzy dizzy  . Vicodin [Hydrocodone-Acetaminophen] Nausea And Vomiting and Other (See Comments)    dizzy  . Hydrocodone Nausea Only    (Not in a hospital admission)   Results for orders placed or performed during the hospital encounter of 04/03/20 (from the past 48 hour(s))  Lipase, blood     Status: None   Collection Time: 04/03/20  6:00 AM  Result Value Ref Range   Lipase 24 11 - 51 U/L    Comment: Performed at [facility], [place] [address], [place], [place] [address]  Comprehensive metabolic panel     Status: Abnormal   Collection Time: 04/03/20  6:00 AM  Result Value Ref Range   Sodium 138 135 - 145 mmol/L   Potassium 3.7 3.5 - 5.1 mmol/L  Chloride 111 98 - 111 mmol/L   CO2 20 (L) 22 - 32 mmol/L   Glucose, Bld 73 70 - 99 mg/dL    Comment: Glucose reference range applies only to samples taken after fasting for at least 8 hours.   BUN 23 (H) 6 - 20 mg/dL   Creatinine, Ser 0.60 0.44 - 1.00 mg/dL   Calcium 7.7 (L) 8.9 - 10.3 mg/dL   Total Protein 6.3 (L) 6.5 - 8.1 g/dL   Albumin 3.6 3.5 - 5.0 g/dL   AST 16 15 - 41 U/L   ALT 16 0 - 44 U/L   Alkaline Phosphatase 84 38 -  126 U/L   Total Bilirubin 0.4 0.3 - 1.2 mg/dL   GFR calc non Af Amer >60 >60 mL/min   GFR calc Af Amer >60 >60 mL/min   Anion gap 7 5 - 15    Comment: Performed at [facility], [place] [address] [place], [place] [address]  CBC     Status: Abnormal   Collection Time: 04/03/20  6:00 AM  Result Value Ref Range   WBC 14.9 (H) 4.0 - 10.5 K/uL   RBC 4.54 3.87 - 5.11 MIL/uL   Hemoglobin 13.3 12.0 - 15.0 g/dL   HCT 42.6 36.0 - 46.0 %   MCV 93.8 80.0 - 100.0 fL   MCH 29.3 26.0 - 34.0 pg   MCHC 31.2 30.0 - 36.0 g/dL   RDW 14.3 11.5 - 15.5 %   Platelets 233 150 - 400 K/uL   nRBC 0.0 0.0 - 0.2 %    Comment: Performed at [facility], [place] [address], [place], [place] [address]  Differential     Status: Abnormal   Collection Time: 04/03/20  6:00 AM  Result Value Ref Range   Neutrophils Relative % 85 %   Neutro Abs 12.6 (H) 1.7 - 7.7 K/uL   Lymphocytes Relative 8 %   Lymphs Abs 1.2 0.7 - 4.0 K/uL   Monocytes Relative 5 %   Monocytes Absolute 0.8 0.1 - 1.0 K/uL   Eosinophils Relative 2 %   Eosinophils Absolute 0.2 0.0 - 0.5 K/uL   Basophils Relative 0 %   Basophils Absolute 0.1 0.0 - 0.1 K/uL   Immature Granulocytes 0 %   Abs Immature Granulocytes 0.04 0.00 - 0.07 K/uL    Comment: Performed at [facility], [place] [address], [place], [place] [address]  Lactic acid, plasma     Status: None   Collection Time: 04/03/20  6:30 AM  Result Value Ref Range   Lactic Acid, Venous 1.1 0.5 - 1.9 mmol/L    Comment: Performed at [facility], [place] [address], [place], [place] [address]  Urinalysis, Routine w reflex microscopic     Status: Abnormal   Collection Time: 04/03/20 10:02 AM  Result Value Ref Range   Color, Urine YELLOW YELLOW   APPearance CLEAR CLEAR   Specific Gravity, Urine >1.046 (H) 1.005 - 1.030   pH 5.0 5.0 - 8.0   Glucose, UA NEGATIVE NEGATIVE mg/dL   Hgb urine dipstick NEGATIVE NEGATIVE   Bilirubin Urine  NEGATIVE NEGATIVE   Ketones, ur NEGATIVE NEGATIVE mg/dL   Protein, ur NEGATIVE NEGATIVE mg/dL   Nitrite NEGATIVE NEGATIVE   Leukocytes,Ua TRACE (A) NEGATIVE   RBC / HPF 0-5 0 - 5 RBC/hpf   WBC, UA 0-5 0 - 5 WBC/hpf   Bacteria, UA NONE SEEN NONE SEEN   Squamous Epithelial / LPF 0-5 0 -  5   Mucus PRESENT     Comment: Performed at [facility], [place] [address], [place], [place] [address]  Respiratory Panel by RT PCR (Flu A&B, Covid) - Nasopharyngeal Swab     Status: None   Collection Time: 04/03/20 10:02 AM   Specimen: Nasopharyngeal Swab  Result Value Ref Range   SARS Coronavirus 2 by RT PCR NEGATIVE NEGATIVE    Comment: (NOTE) SARS-CoV-2 target nucleic acids are NOT DETECTED. The SARS-CoV-2 RNA is generally detectable in upper respiratoy specimens during the acute phase of infection. The lowest concentration of SARS-CoV-2 viral copies this assay can detect is 131 copies/mL. A negative result does not preclude SARS-Cov-2 infection and should not be used as the sole basis for treatment or other patient management decisions. A negative result may occur with  improper specimen collection/handling, submission of specimen other than nasopharyngeal swab, presence of viral mutation(s) within the areas targeted by this assay, and inadequate number of viral copies (<131 copies/mL). A negative result must be combined with clinical observations, patient history, and epidemiological information. The expected result is Negative. Fact Sheet for Patients:  PinkCheek.be Fact Sheet for Healthcare Providers:  GravelBags.it This test is not yet ap proved or cleared by the [place] FDA and  has been authorized for detection and/or diagnosis of SARS-CoV-2 by FDA under an Emergency Use Authorization (EUA). This EUA will remain  in effect (meaning this test can be used) for the duration of the COVID-19 declaration under  Section 564(b)(1) of the Act, 21 U.S.C. section 360bbb-3(b)(1), unless the authorization is terminated or revoked sooner.    Influenza A by PCR NEGATIVE NEGATIVE   Influenza B by PCR NEGATIVE NEGATIVE    Comment: (NOTE) The Xpert Xpress SARS-CoV-2/FLU/RSV assay is intended as an aid in  the diagnosis of influenza from Nasopharyngeal swab specimens and  should not be used as a sole basis for treatment. Nasal washings and  aspirates are unacceptable for Xpert Xpress SARS-CoV-2/FLU/RSV  testing. Fact Sheet for Patients: PinkCheek.be Fact Sheet for Healthcare Providers: GravelBags.it This test is not yet approved or cleared by the [place] FDA and  has been authorized for detection and/or diagnosis of SARS-CoV-2 by  FDA under an Emergency Use Authorization (EUA). This EUA will remain  in effect (meaning this test can be used) for the duration of the  Covid-19 declaration under Section 564(b)(1) of the Act, 21  U.S.C. section 360bbb-3(b)(1), unless the authorization is  terminated or revoked. Performed at [facility], [place] [address] [place], [place] [address]    CT Abdomen Pelvis W Contrast  Result Date: 04/03/2020 CLINICAL DATA:  Evaluate for suspect diverticulitis. Left lower quadrant pain and nausea beginning this morning. EXAM: CT ABDOMEN AND PELVIS WITH CONTRAST TECHNIQUE: Multidetector CT imaging of the abdomen and pelvis was performed using the standard protocol following bolus administration of intravenous contrast. CONTRAST:  162mL OMNIPAQUE IOHEXOL 300 MG/ML  SOLN COMPARISON:  09/06/2019 FINDINGS: Lower chest: Lung bases demonstrate dependent bibasilar atelectasis. 5 mm nodule over the peripheral posterior right lower lobe. Position of this nodule slightly different compared to previous exams and is slightly larger as cannot be certain this represents the same nodule. Hepatobiliary: Possible subtle  cholelithiasis. Liver and biliary tree are normal. Pancreas: 1.4 cm cystic lesion along the superior border of the body of the pancreas not significantly changed. 1.4 cm low-density lesion just superior and anterior to the pancreatic tail without significant change. Spleen: Normal. Adrenals/Urinary Tract: Adrenal glands are normal.  Kidneys are normal size without hydronephrosis or nephrolithiasis. Ureters and bladder are normal. Stomach/Bowel: Stomach is normal. Minimal focal dilatation of a proximal jejunal loop measuring 3.2 cm. Remaining small bowel is unremarkable. Appendix is normal. There is diverticulosis of the colon. There is mild acute diverticulitis of the descending colon over the left mid to lower abdomen. Mild adjacent inflammatory change and fluid. Possible small diverticular abscess versus inflamed diverticulum measuring 1.1 x 1.8 cm abutting the lateral wall of the descending colon. This communicates via a sinus tract more laterally with a small focus of air abutting the lateral wall of the abdomen. This small focus of air is contained and may communicate with the colon. No definite free air. Vascular/Lymphatic: Abdominal aorta is normal caliber. No significant adenopathy. Reproductive: Normal. Other: None. Musculoskeletal: No acute findings. IMPRESSION: 1. Evidence of acute diverticulitis of the mid to distal descending colon with suggestion of a small associated diverticular abscess measuring 1.1 x 1.8 cm. Minimal dilatation of a short segment jejunal loop likely secondary ileus. 2. 1.4 cm cystic lesion over the body of the pancreas unchanged with adjacent oval cystic 1.4 cm lesion anterior superior to the pancreatic tail unchanged. Recommend follow-up CT 1 year. This recommendation follows ACR consensus guidelines: Management of Incidental Pancreatic Cysts: A White Paper of the ACR Incidental Findings Committee. [place] [id number]. 3.  Possible minimal cholelithiasis. 4. 5 mm nodule  over the right lower lobe. Recommend follow-up CT 1 year. This recommendation follows the consensus statement: Guidelines for Management of Small Pulmonary Nodules Detected on CT Scans: A Statement from the [place] as published in Radiology 2005; 237:395-400. Online at: [email or web address].[email or web address].[email or web address]/. Electronically Signed   By: Marin Olp M.D.   On: 04/03/2020 09:08    Review of Systems  Constitutional: Positive for appetite change.  HENT: Negative.   Eyes: Negative.   Respiratory: Negative.   Cardiovascular: Negative.   Gastrointestinal: Positive for abdominal pain and diarrhea.  Endocrine: Negative.   Genitourinary: Negative.   Musculoskeletal: Negative.   Skin: Negative.   Allergic/Immunologic: Negative.   Neurological: Negative.   Hematological: Negative.   Psychiatric/Behavioral: Positive for decreased concentration. The patient is nervous/anxious.     Blood pressure 120/88, pulse 99, temperature 98.1 F (36.7 C), temperature source Oral, resp. rate 18, height 5\' 5"  (1.651 m), weight 67.1 kg, SpO2 92 %.  CONSTITUTIONAL: NAD; conversant; no obvious deformities EYES:   Moist conjunctiva; no lid lag; anicteric; PERRL NECK:   Trachea midline; no thyromegaly LUNGS:  Normal respiratory effort; no wheeze; no rales; no tactile fremitus CV:   RRR; no palpable thrills; no pitting edema GI:    Abdomen is soft without distention; mild diffuse tenderness maximum in left mid abdomen and left lower quadrant; voluntary guarding; no palpable mass; no palpable hepatosplenomegaly MSK:    Normal range of motion of extremities; no clubbing; no cyanosis PSYCHIATRIC:  Appropriate affect for situation; alert and oriented X 3 LYMPHATIC:   No palpable cervical lymphadenopathy  Assessment/Plan Acute diverticulitis with small associated diverticular abscess, 1.8 cm  Admit to surgical service  IV abx's per protocol  NPO, IV hydration  Pain Rx, nausea Rx prn  I  discussed the findings on laboratory studies and CT scan with the patient.  I have recommended admission, n.p.o. status, IV hydration, and intravenous antibiotics.  We discussed the hospital course to be anticipated.  Hopefully this will again resolve with medical management.  At this point, the patient does need to consider surgical intervention.  Hopefully  that can be postponed until this acute episode is completely treated and she can be evaluated by one of our colorectal surgeons.  Patient understands and is in agreement with this plan.  Armandina Gemma, MD [facility] Office: [place], [place] 04/03/2020, 12:56 PM

## 2020-04-03 NOTE — Plan of Care (Signed)
  Problem: Clinical Measurements: Goal: Respiratory complications will improve Outcome: Progressing   Problem: Clinical Measurements: Goal: Cardiovascular complication will be avoided Outcome: Progressing   Problem: Activity: Goal: Risk for activity intolerance will decrease Outcome: Progressing   Problem: Nutrition: Goal: Adequate nutrition will be maintained Outcome: Progressing   Problem: Coping: Goal: Level of anxiety will decrease Outcome: Progressing   Problem: Safety: Goal: Ability to remain free from injury will improve Outcome: Progressing

## 2020-04-03 NOTE — ED Triage Notes (Signed)
Patient complaining of woken up by left lower abdominal pain and nausea. Patient states she is in extreme pain. Last time she was here she had a perforated bowel.

## 2020-04-03 NOTE — ED Provider Notes (Signed)
Care assumed from [facility], [place]. See her note for full H&P.   Per her note, "[name] is a 54 y.o. female with pertinent past medical history of diverticulitis, GERD, hypertension, alcohol abuse -is now sober that presents to the emergency department today for left lower quadrant pain and left lower flank pain that started a couple hours ago.  She states that she had diarrhea last night.  She denies any melena or hematochezia.  She states this episode feels like last time she had a perforated bowel due to diverticulitis.  She has not taken anything for pain.  She states that she was told by surgery a couple months ago that she needs to get  surgery for her diverticulitis, however she has not been able to get that yet.  She admits to some nausea and feeling hot and sweaty.  She denies any vomiting, fevers, chills, chest pain, shortness of breath, dizziness, headache, leg swelling.  She denies any current alcohol use, IV drug use, tobacco use.  She states that she has not had an appetite recently.  She states that she has been urinating normally -denies any dysuria or hematuria.  She states that she has not had her period in over 10 years."  Physical Exam  BP 120/88   Pulse 99   Temp 98.1 F (36.7 C) (Oral)   Resp 18   Ht 5\' 5"  (1.651 m)   Wt 67.1 kg   SpO2 92%   BMI 24.63 kg/m   Physical Exam Vitals and nursing note reviewed.  Constitutional:      General: She is not in acute distress.    Appearance: She is well-developed.  HENT:     Head: Normocephalic and atraumatic.  Eyes:     Conjunctiva/sclera: Conjunctivae normal.  Cardiovascular:     Rate and Rhythm: Normal rate and regular rhythm.     Heart sounds: No murmur.  Pulmonary:     Effort: Pulmonary effort is normal. No respiratory distress.     Breath sounds: Normal breath sounds.  Abdominal:     General: Bowel sounds are normal.     Palpations: Abdomen is soft.     Tenderness: There is abdominal tenderness in the left  lower quadrant. There is guarding. There is no rebound.  Musculoskeletal:     Cervical back: Neck supple.  Skin:    General: Skin is warm and dry.  Neurological:     Mental Status: She is alert.      ED Course/Procedures     Procedures  Results for orders placed or performed during the hospital encounter of 04/03/20  Respiratory Panel by RT PCR (Flu A&B, Covid) - Nasopharyngeal Swab   Specimen: Nasopharyngeal Swab  Result Value Ref Range   SARS Coronavirus 2 by RT PCR NEGATIVE NEGATIVE   Influenza A by PCR NEGATIVE NEGATIVE   Influenza B by PCR NEGATIVE NEGATIVE  Lipase, blood  Result Value Ref Range   Lipase 24 11 - 51 U/L  Comprehensive metabolic panel  Result Value Ref Range   Sodium 138 135 - 145 mmol/L   Potassium 3.7 3.5 - 5.1 mmol/L   Chloride 111 98 - 111 mmol/L   CO2 20 (L) 22 - 32 mmol/L   Glucose, Bld 73 70 - 99 mg/dL   BUN 23 (H) 6 - 20 mg/dL   Creatinine, Ser 0.60 0.44 - 1.00 mg/dL   Calcium 7.7 (L) 8.9 - 10.3 mg/dL   Total Protein 6.3 (L) 6.5 - 8.1  g/dL   Albumin 3.6 3.5 - 5.0 g/dL   AST 16 15 - 41 U/L   ALT 16 0 - 44 U/L   Alkaline Phosphatase 84 38 - 126 U/L   Total Bilirubin 0.4 0.3 - 1.2 mg/dL   GFR calc non Af Amer >60 >60 mL/min   GFR calc Af Amer >60 >60 mL/min   Anion gap 7 5 - 15  CBC  Result Value Ref Range   WBC 14.9 (H) 4.0 - 10.5 K/uL   RBC 4.54 3.87 - 5.11 MIL/uL   Hemoglobin 13.3 12.0 - 15.0 g/dL   HCT 42.6 36.0 - 46.0 %   MCV 93.8 80.0 - 100.0 fL   MCH 29.3 26.0 - 34.0 pg   MCHC 31.2 30.0 - 36.0 g/dL   RDW 14.3 11.5 - 15.5 %   Platelets 233 150 - 400 K/uL   nRBC 0.0 0.0 - 0.2 %  Urinalysis, Routine w reflex microscopic  Result Value Ref Range   Color, Urine YELLOW YELLOW   APPearance CLEAR CLEAR   Specific Gravity, Urine >1.046 (H) 1.005 - 1.030   pH 5.0 5.0 - 8.0   Glucose, UA NEGATIVE NEGATIVE mg/dL   Hgb urine dipstick NEGATIVE NEGATIVE   Bilirubin Urine NEGATIVE NEGATIVE   Ketones, ur NEGATIVE NEGATIVE mg/dL   Protein,  ur NEGATIVE NEGATIVE mg/dL   Nitrite NEGATIVE NEGATIVE   Leukocytes,Ua TRACE (A) NEGATIVE   RBC / HPF 0-5 0 - 5 RBC/hpf   WBC, UA 0-5 0 - 5 WBC/hpf   Bacteria, UA NONE SEEN NONE SEEN   Squamous Epithelial / LPF 0-5 0 - 5   Mucus PRESENT   Lactic acid, plasma  Result Value Ref Range   Lactic Acid, Venous 1.1 0.5 - 1.9 mmol/L  Differential  Result Value Ref Range   Neutrophils Relative % 85 %   Neutro Abs 12.6 (H) 1.7 - 7.7 K/uL   Lymphocytes Relative 8 %   Lymphs Abs 1.2 0.7 - 4.0 K/uL   Monocytes Relative 5 %   Monocytes Absolute 0.8 0.1 - 1.0 K/uL   Eosinophils Relative 2 %   Eosinophils Absolute 0.2 0.0 - 0.5 K/uL   Basophils Relative 0 %   Basophils Absolute 0.1 0.0 - 0.1 K/uL   Immature Granulocytes 0 %   Abs Immature Granulocytes 0.04 0.00 - 0.07 K/uL   MR BRAIN W WO CONTRAST  Result Date: 03/25/2020 CLINICAL DATA:  Migraine headaches over the last year but coming more frequent. EXAM: MRI HEAD WITHOUT AND WITH CONTRAST TECHNIQUE: Multiplanar, multiecho pulse sequences of the brain and surrounding structures were obtained without and with intravenous contrast. CONTRAST:  3mL GADAVIST GADOBUTROL 1 MMOL/ML IV SOLN COMPARISON:  Head CT 11/23/2016 FINDINGS: Brain: The brain has a normal appearance without evidence of malformation, atrophy, old or acute small or large vessel infarction, mass lesion, hemorrhage, hydrocephalus or extra-axial collection. Pituitary gland grossly normal. No abnormal enhancement of the brain or leptomeninges. Vascular: Major vessels at the base of the brain show flow. Venous sinuses appear patent. Skull and upper cervical spine: Normal. Sinuses/Orbits: Clear/normal. Other: None significant. IMPRESSION: Normal examination.  No finding associated with headache. Electronically Signed   By: Nelson Chimes M.D.   On: 03/25/2020 08:31   CT Abdomen Pelvis W Contrast  Result Date: 04/03/2020 CLINICAL DATA:  Evaluate for suspect diverticulitis. Left lower quadrant pain  and nausea beginning this morning. EXAM: CT ABDOMEN AND PELVIS WITH CONTRAST TECHNIQUE: Multidetector CT imaging of the abdomen and pelvis  was performed using the standard protocol following bolus administration of intravenous contrast. CONTRAST:  187mL OMNIPAQUE IOHEXOL 300 MG/ML  SOLN COMPARISON:  09/06/2019 FINDINGS: Lower chest: Lung bases demonstrate dependent bibasilar atelectasis. 5 mm nodule over the peripheral posterior right lower lobe. Position of this nodule slightly different compared to previous exams and is slightly larger as cannot be certain this represents the same nodule. Hepatobiliary: Possible subtle cholelithiasis. Liver and biliary tree are normal. Pancreas: 1.4 cm cystic lesion along the superior border of the body of the pancreas not significantly changed. 1.4 cm low-density lesion just superior and anterior to the pancreatic tail without significant change. Spleen: Normal. Adrenals/Urinary Tract: Adrenal glands are normal. Kidneys are normal size without hydronephrosis or nephrolithiasis. Ureters and bladder are normal. Stomach/Bowel: Stomach is normal. Minimal focal dilatation of a proximal jejunal loop measuring 3.2 cm. Remaining small bowel is unremarkable. Appendix is normal. There is diverticulosis of the colon. There is mild acute diverticulitis of the descending colon over the left mid to lower abdomen. Mild adjacent inflammatory change and fluid. Possible small diverticular abscess versus inflamed diverticulum measuring 1.1 x 1.8 cm abutting the lateral wall of the descending colon. This communicates via a sinus tract more laterally with a small focus of air abutting the lateral wall of the abdomen. This small focus of air is contained and may communicate with the colon. No definite free air. Vascular/Lymphatic: Abdominal aorta is normal caliber. No significant adenopathy. Reproductive: Normal. Other: None. Musculoskeletal: No acute findings. IMPRESSION: 1. Evidence of acute  diverticulitis of the mid to distal descending colon with suggestion of a small associated diverticular abscess measuring 1.1 x 1.8 cm. Minimal dilatation of a short segment jejunal loop likely secondary ileus. 2. 1.4 cm cystic lesion over the body of the pancreas unchanged with adjacent oval cystic 1.4 cm lesion anterior superior to the pancreatic tail unchanged. Recommend follow-up CT 1 year. This recommendation follows ACR consensus guidelines: Management of Incidental Pancreatic Cysts: A White Paper of the ACR Incidental Findings Committee. [place] [id number]. 3.  Possible minimal cholelithiasis. 4. 5 mm nodule over the right lower lobe. Recommend follow-up CT 1 year. This recommendation follows the consensus statement: Guidelines for Management of Small Pulmonary Nodules Detected on CT Scans: A Statement from the [employer] as published in Radiology 2005; 237:395-400. Online at: [email or web address] Electronically Signed   By: Marin Olp M.D.   On: 04/03/2020 09:08    MDM   Briefly, pt with h/o diverticulitis with perforation. Presenting today for eval of LLQ abd pain and diarrhea that started last night.   Reviewed/interpreted labs CBC With leukocytosis, no anemia CMP with mildly elevated BUN, normal cr, no gross electrolyte derangement, normal liver function Lipase wnl Lactic acid negative, making sepsis less likely UA without evidence of UTI   CT abd/pelvis with evidence of diverticulitis with abscess and likely secondary ileus. Also with incidental findings which were communicated to the patient.   9:30 AM CONSULT With Dr. Harlow Asa with general surgery, who will come see the patient.   Pt admitted to general surgery service.   Rodney Booze, PA-C 04/03/20 1317    Ward, Delice Bison, DO 04/03/20 2329

## 2020-04-03 NOTE — Progress Notes (Signed)
Pt to floor in stable condition.

## 2020-04-04 ENCOUNTER — Encounter (HOSPITAL_COMMUNITY): Payer: Self-pay

## 2020-04-04 DIAGNOSIS — K219 Gastro-esophageal reflux disease without esophagitis: Secondary | ICD-10-CM | POA: Diagnosis present

## 2020-04-04 DIAGNOSIS — N393 Stress incontinence (female) (male): Secondary | ICD-10-CM | POA: Diagnosis present

## 2020-04-04 DIAGNOSIS — Z862 Personal history of diseases of the blood and blood-forming organs and certain disorders involving the immune mechanism: Secondary | ICD-10-CM

## 2020-04-04 DIAGNOSIS — I10 Essential (primary) hypertension: Secondary | ICD-10-CM | POA: Diagnosis present

## 2020-04-04 DIAGNOSIS — F909 Attention-deficit hyperactivity disorder, unspecified type: Secondary | ICD-10-CM | POA: Diagnosis present

## 2020-04-04 LAB — CBC
HCT: 39 % (ref 36.0–46.0)
Hemoglobin: 11.8 g/dL — ABNORMAL LOW (ref 12.0–15.0)
MCH: 28.7 pg (ref 26.0–34.0)
MCHC: 30.3 g/dL (ref 30.0–36.0)
MCV: 94.9 fL (ref 80.0–100.0)
Platelets: 189 10*3/uL (ref 150–400)
RBC: 4.11 MIL/uL (ref 3.87–5.11)
RDW: 14.5 % (ref 11.5–15.5)
WBC: 13.7 10*3/uL — ABNORMAL HIGH (ref 4.0–10.5)
nRBC: 0 % (ref 0.0–0.2)

## 2020-04-04 MED ORDER — IPRATROPIUM-ALBUTEROL 0.5-2.5 (3) MG/3ML IN SOLN: 3.0000 mL | Freq: Four times a day (QID) | RESPIRATORY_TRACT | Status: DC | PRN

## 2020-04-04 MED ORDER — THIAMINE HCL 100 MG PO TABS
100.0000 mg | ORAL_TABLET | Freq: Every day | ORAL | Status: DC
  Administered 2020-04-04 – 2020-04-12 (×9): 100 mg via ORAL
  Filled 2020-04-04 (×9): qty 1

## 2020-04-04 MED ORDER — ADULT MULTIVITAMIN W/MINERALS CH
1.0000 | ORAL_TABLET | Freq: Every day | ORAL | Status: DC
  Administered 2020-04-04 – 2020-04-12 (×9): 1 via ORAL
  Filled 2020-04-04 (×9): qty 1

## 2020-04-04 MED ORDER — LORAZEPAM 2 MG/ML IJ SOLN: 0.0000 mg | Freq: Two times a day (BID) | INTRAMUSCULAR | Status: AC

## 2020-04-04 MED ORDER — FLUTICASONE PROPIONATE 50 MCG/ACT NA SUSP
2.0000 | Freq: Every day | NASAL | Status: DC
  Administered 2020-04-04 – 2020-04-12 (×7): 2 via NASAL
  Filled 2020-04-04: qty 16

## 2020-04-04 MED ORDER — FOLIC ACID 1 MG PO TABS
1.0000 mg | ORAL_TABLET | Freq: Every day | ORAL | Status: DC
  Administered 2020-04-04 – 2020-04-12 (×9): 1 mg via ORAL
  Filled 2020-04-04 (×9): qty 1

## 2020-04-04 MED ORDER — LORAZEPAM 2 MG/ML IJ SOLN
0.0000 mg | Freq: Four times a day (QID) | INTRAMUSCULAR | Status: AC
  Administered 2020-04-04: 14:00:00 1 mg via INTRAVENOUS
  Filled 2020-04-04: qty 1

## 2020-04-04 MED ORDER — SUMATRIPTAN SUCCINATE 50 MG PO TABS
100.0000 mg | ORAL_TABLET | Freq: Every day | ORAL | Status: DC | PRN
  Administered 2020-04-04 – 2020-04-11 (×5): 100 mg via ORAL
  Filled 2020-04-04 (×9): qty 2

## 2020-04-04 MED ORDER — THIAMINE HCL 100 MG/ML IJ SOLN
100.0000 mg | Freq: Every day | INTRAMUSCULAR | Status: DC
  Filled 2020-04-04: qty 2

## 2020-04-04 MED ORDER — ALBUTEROL SULFATE (2.5 MG/3ML) 0.083% IN NEBU
2.5000 mg | INHALATION_SOLUTION | Freq: Four times a day (QID) | RESPIRATORY_TRACT | Status: DC | PRN
  Administered 2020-04-04: 2.5 mg via RESPIRATORY_TRACT
  Filled 2020-04-04: qty 3

## 2020-04-04 NOTE — Progress Notes (Addendum)
[name] [id number] October 14, 1966  CARE TEAM:  PCP: Maryland Pink, MD  Outpatient Care Team: Patient Care Team: Maryland Pink, MD as PCP - General (Family Medicine) Ronald Lobo, MD as Consulting Physician (Gastroenterology) Vladimir Crofts, MD as Consulting Physician (Neurology) Ottie Glazier, MD as Consulting Physician (Pulmonary Disease)  Inpatient Treatment Team: Treatment Team: Attending Provider: Edison Pace Md, MD; Consulting Physician: Ronald Lobo, MD   Problem List:   Principal Problem:   Diverticulitis of large intestine with perforation and abscess Active Problems:   GAD (generalized anxiety disorder)   MDD (major depressive disorder)   History of alcoholism ([place])   Intractable migraine with aura without status migrainosus      * No surgery found *      Assessment  Recurrent/persistent diverticulitis now with abscess.  Stabilizing  [facility] Stay = 1 days)  Plan:  IV antibiotics.  Zosyn given complexity  Anxiolysis  History of alcoholism.  I believe she is in recovery.  Will place on the as needed aspect only of the CIWA protocol just ion case, not standing.  Hypertension control.  GERD control.  VTE prophylaxis- SCDs, etc  mobilize as tolerated to help recovery  I agree with Dr. Harlow Asa that at some point this patient would benefit from colectomy to remove the area of diverticulitis in the sigmoid colon.  This is her fourth attack and this 1 had an abscess.  She wonders if this is been a persistent attack over the past month.  She had a colonoscopy earlier this year by Dr. Cristina Gong and [facility] gastroenterology.  She recalls it being underwhelming.  We will double check records.  I think she is ready to consider colectomy.  She would like our group since we have taken care of her husband.  I gave her my card since I do a fair amount of my colorectal surgery and recommend she follow-up with myself or one of my colorectal partners with our group to  discuss.  If she worsens, she may require drainage.  Hopefully the likelihood of a Hartmann resection this admission was rather low, but not zero.  30 minutes spent in review, evaluation, examination, counseling, and coordination of care.  More than 50% of that time was spent in counseling.  04/04/2020    Subjective: (Chief complaint)  Still with pain but much less.  Tired but not nauseated.  Objective:  Vital signs:  Vitals:   04/04/20 0053 04/04/20 0607 04/04/20 0802 04/04/20 0930  BP: 111/76 109/73  104/63  Pulse: 84 71  70  Resp: 18 20  16   Temp: 98.4 F (36.9 C) 97.7 F (36.5 C)  97.7 F (36.5 C)  TempSrc: Oral   Oral  SpO2: 100% 100% 99% 100%  Weight:      Height:        Last BM Date: 04/03/20  Intake/Output   Yesterday:  05/01 0701 - 05/02 0700 In: 1292.7 [I.V.:1231.1; IV Piggyback:61.6] Out: -  This shift:  No intake/output data recorded.  Bowel function:  Flatus: No  BM:  No  Drain: (No drain)   Physical Exam:  General: Pt awake/alert in mild acute distress Eyes: PERRL, normal EOM.  Sclera clear.  No icterus Neuro: CN II-XII intact w/o focal sensory/motor deficits. Lymph: No head/neck/groin lymphadenopathy Psych:  No delerium/psychosis/paranoia.  Oriented x 4 HENT: Normocephalic, Mucus membranes moist.  No thrush Neck: Supple, No tracheal deviation.  No obvious thyromegaly Chest: No pain to chest wall compression.  Good respiratory excursion.  No  audible wheezing CV:  Pulses intact.  Regular rhythm.  No major extremity edema MS: Normal AROM mjr joints.  No obvious deformity  Abdomen: Soft.  Mildy distended.  Tenderness at LLQ only.  No evidence of peritonitis.  No incarcerated hernias.  Ext:  No deformity.  No mjr edema.  No cyanosis Skin: No petechiae / purpurea.  No major sores.  Warm and dry    Results:   Cultures: Recent Results (from the past 720 hour(s))  Blood culture (routine x 2)     Status: None (Preliminary result)    Collection Time: 04/03/20 10:02 AM   Specimen: BLOOD  Result Value Ref Range Status   Specimen Description   Final    BLOOD LEFT ANTECUBITAL Performed at [facility] [address] [place], [place] [address]    Special Requests   Final    BOTTLES DRAWN AEROBIC AND ANAEROBIC Blood Culture adequate volume Performed at [facility] [address], [place], [place] [address]    Culture   Final    NO GROWTH < 24 HOURS Performed at [facility] [address], [place], [place] [address]    Report Status PENDING  Incomplete  Blood culture (routine x 2)     Status: None (Preliminary result)   Collection Time: 04/03/20 10:02 AM   Specimen: BLOOD  Result Value Ref Range Status   Specimen Description   Final    BLOOD RIGHT ANTECUBITAL Performed at [facility] [address] [place] [address] [place], [place] [address]    Special Requests   Final    BOTTLES DRAWN AEROBIC AND ANAEROBIC Blood Culture adequate volume Performed at [facility] [address], [place], [place] [address]    Culture   Final    NO GROWTH < 24 HOURS Performed at [facility] [address], [place], [place] [address]    Report Status PENDING  Incomplete  Respiratory Panel by RT PCR (Flu A&B, Covid) - Nasopharyngeal Swab     Status: None   Collection Time: 04/03/20 10:02 AM   Specimen: Nasopharyngeal Swab  Result Value Ref Range Status   SARS Coronavirus 2 by RT PCR NEGATIVE NEGATIVE Final    Comment: (NOTE) SARS-CoV-2 target nucleic acids are NOT DETECTED. The SARS-CoV-2 RNA is generally detectable in upper respiratoy specimens during the acute phase of infection. The lowest concentration of SARS-CoV-2 viral copies this assay can detect is 131 copies/mL. A negative result does not preclude SARS-Cov-2 infection and should not be used as the sole basis for treatment or other patient management decisions. A negative result may occur  with  improper specimen collection/handling, submission of specimen other than nasopharyngeal swab, presence of viral mutation(s) within the areas targeted by this assay, and inadequate number of viral copies (<131 copies/mL). A negative result must be combined with clinical observations, patient history, and epidemiological information. The expected result is Negative. Fact Sheet for Patients:  PinkCheek.be Fact Sheet for Healthcare Providers:  GravelBags.it This test is not yet ap proved or cleared by the [place] FDA and  has been authorized for detection and/or diagnosis of SARS-CoV-2 by FDA under an Emergency Use Authorization (EUA). This EUA will remain  in effect (meaning this test can be used) for the duration of the COVID-19 declaration under Section 564(b)(1) of the Act, 21 U.S.C. section 360bbb-3(b)(1), unless the authorization is terminated or revoked sooner.    Influenza A by PCR NEGATIVE NEGATIVE Final  Influenza B by PCR NEGATIVE NEGATIVE Final    Comment: (NOTE) The Xpert Xpress SARS-CoV-2/FLU/RSV assay is intended as an aid in  the diagnosis of influenza from Nasopharyngeal swab specimens and  should not be used as a sole basis for treatment. Nasal washings and  aspirates are unacceptable for Xpert Xpress SARS-CoV-2/FLU/RSV  testing. Fact Sheet for Patients: PinkCheek.be Fact Sheet for Healthcare Providers: GravelBags.it This test is not yet approved or cleared by the [place] FDA and  has been authorized for detection and/or diagnosis of SARS-CoV-2 by  FDA under an Emergency Use Authorization (EUA). This EUA will remain  in effect (meaning this test can be used) for the duration of the  Covid-19 declaration under Section 564(b)(1) of the Act, 21  U.S.C. section 360bbb-3(b)(1), unless the authorization is  terminated or revoked. Performed  at [facility], [place] [address] [place], [place] [address]     Labs: Results for orders placed or performed during the hospital encounter of 04/03/20 (from the past 48 hour(s))  Lipase, blood     Status: None   Collection Time: 04/03/20  6:00 AM  Result Value Ref Range   Lipase 24 11 - 51 U/L    Comment: Performed at [facility], [place] [address] [place], [place] [address]  Comprehensive metabolic panel     Status: Abnormal   Collection Time: 04/03/20  6:00 AM  Result Value Ref Range   Sodium 138 135 - 145 mmol/L   Potassium 3.7 3.5 - 5.1 mmol/L   Chloride 111 98 - 111 mmol/L   CO2 20 (L) 22 - 32 mmol/L   Glucose, Bld 73 70 - 99 mg/dL    Comment: Glucose reference range applies only to samples taken after fasting for at least 8 hours.   BUN 23 (H) 6 - 20 mg/dL   Creatinine, Ser 0.60 0.44 - 1.00 mg/dL   Calcium 7.7 (L) 8.9 - 10.3 mg/dL   Total Protein 6.3 (L) 6.5 - 8.1 g/dL   Albumin 3.6 3.5 - 5.0 g/dL   AST 16 15 - 41 U/L   ALT 16 0 - 44 U/L   Alkaline Phosphatase 84 38 - 126 U/L   Total Bilirubin 0.4 0.3 - 1.2 mg/dL   GFR calc non Af Amer >60 >60 mL/min   GFR calc Af Amer >60 >60 mL/min   Anion gap 7 5 - 15    Comment: Performed at [facility], [place] [address] [place], [place] [address]  CBC     Status: Abnormal   Collection Time: 04/03/20  6:00 AM  Result Value Ref Range   WBC 14.9 (H) 4.0 - 10.5 K/uL   RBC 4.54 3.87 - 5.11 MIL/uL   Hemoglobin 13.3 12.0 - 15.0 g/dL   HCT 42.6 36.0 - 46.0 %   MCV 93.8 80.0 - 100.0 fL   MCH 29.3 26.0 - 34.0 pg   MCHC 31.2 30.0 - 36.0 g/dL   RDW 14.3 11.5 - 15.5 %   Platelets 233 150 - 400 K/uL   nRBC 0.0 0.0 - 0.2 %    Comment: Performed at [facility], [place] [address], [place], [place] [address]  Differential     Status: Abnormal   Collection Time: 04/03/20  6:00 AM  Result Value Ref Range   Neutrophils Relative % 85 %   Neutro Abs 12.6 (H) 1.7 - 7.7  K/uL   Lymphocytes Relative 8 %   Lymphs Abs 1.2  0.7 - 4.0 K/uL   Monocytes Relative 5 %   Monocytes Absolute 0.8 0.1 - 1.0 K/uL   Eosinophils Relative 2 %   Eosinophils Absolute 0.2 0.0 - 0.5 K/uL   Basophils Relative 0 %   Basophils Absolute 0.1 0.0 - 0.1 K/uL   Immature Granulocytes 0 %   Abs Immature Granulocytes 0.04 0.00 - 0.07 K/uL    Comment: Performed at [facility], [place] [address], [place], [place] [address]  Lactic acid, plasma     Status: None   Collection Time: 04/03/20  6:30 AM  Result Value Ref Range   Lactic Acid, Venous 1.1 0.5 - 1.9 mmol/L    Comment: Performed at [facility], [place] [address] [place], [place] [address]  Urinalysis, Routine w reflex microscopic     Status: Abnormal   Collection Time: 04/03/20 10:02 AM  Result Value Ref Range   Color, Urine YELLOW YELLOW   APPearance CLEAR CLEAR   Specific Gravity, Urine >1.046 (H) 1.005 - 1.030   pH 5.0 5.0 - 8.0   Glucose, UA NEGATIVE NEGATIVE mg/dL   Hgb urine dipstick NEGATIVE NEGATIVE   Bilirubin Urine NEGATIVE NEGATIVE   Ketones, ur NEGATIVE NEGATIVE mg/dL   Protein, ur NEGATIVE NEGATIVE mg/dL   Nitrite NEGATIVE NEGATIVE   Leukocytes,Ua TRACE (A) NEGATIVE   RBC / HPF 0-5 0 - 5 RBC/hpf   WBC, UA 0-5 0 - 5 WBC/hpf   Bacteria, UA NONE SEEN NONE SEEN   Squamous Epithelial / LPF 0-5 0 - 5   Mucus PRESENT     Comment: Performed at [facility], [place] [address] [place], [place] [address]  Blood culture (routine x 2)     Status: None (Preliminary result)   Collection Time: 04/03/20 10:02 AM   Specimen: BLOOD  Result Value Ref Range   Specimen Description      BLOOD LEFT ANTECUBITAL Performed at [facility], [place] [address] [place], [place] [address]    Special Requests      BOTTLES DRAWN AEROBIC AND ANAEROBIC Blood Culture adequate volume Performed at [facility] [address], [place], [place] [address]      Culture      NO GROWTH < 24 HOURS Performed at [facility] [address] [place], [place] [address]    Report Status PENDING   Blood culture (routine x 2)     Status: None (Preliminary result)   Collection Time: 04/03/20 10:02 AM   Specimen: BLOOD  Result Value Ref Range   Specimen Description      BLOOD RIGHT ANTECUBITAL Performed at [facility] [address], [place], [place] [address]    Special Requests      BOTTLES DRAWN AEROBIC AND ANAEROBIC Blood Culture adequate volume Performed at [facility] [address], [place], [place] [address]    Culture      NO GROWTH < 24 HOURS Performed at [facility] [address], [place],  [address]    Report Status PENDING   Respiratory Panel by RT PCR (Flu A&B, Covid) - Nasopharyngeal Swab     Status: None   Collection Time: 04/03/20 10:02 AM   Specimen: Nasopharyngeal Swab  Result Value Ref Range   SARS Coronavirus 2 by RT PCR NEGATIVE NEGATIVE    Comment: (NOTE) SARS-CoV-2 target nucleic acids are NOT DETECTED. The SARS-CoV-2 RNA is generally detectable in upper respiratoy specimens during  the acute phase of infection. The lowest concentration of SARS-CoV-2 viral copies this assay can detect is 131 copies/mL. A negative result does not preclude SARS-Cov-2 infection and should not be used as the sole basis for treatment or other patient management decisions. A negative result may occur with  improper specimen collection/handling, submission of specimen other than nasopharyngeal swab, presence of viral mutation(s) within the areas targeted by this assay, and inadequate number of viral copies (<131 copies/mL). A negative result must be combined with clinical observations, patient history, and epidemiological information. The expected result is Negative. Fact Sheet for Patients:  PinkCheek.be Fact Sheet for Healthcare Providers:   GravelBags.it This test is not yet ap proved or cleared by the [place] FDA and  has been authorized for detection and/or diagnosis of SARS-CoV-2 by FDA under an Emergency Use Authorization (EUA). This EUA will remain  in effect (meaning this test can be used) for the duration of the COVID-19 declaration under Section 564(b)(1) of the Act, 21 U.S.C. section 360bbb-3(b)(1), unless the authorization is terminated or revoked sooner.    Influenza A by PCR NEGATIVE NEGATIVE   Influenza B by PCR NEGATIVE NEGATIVE    Comment: (NOTE) The Xpert Xpress SARS-CoV-2/FLU/RSV assay is intended as an aid in  the diagnosis of influenza from Nasopharyngeal swab specimens and  should not be used as a sole basis for treatment. Nasal washings and  aspirates are unacceptable for Xpert Xpress SARS-CoV-2/FLU/RSV  testing. Fact Sheet for Patients: PinkCheek.be Fact Sheet for Healthcare Providers: GravelBags.it This test is not yet approved or cleared by the [place] FDA and  has been authorized for detection and/or diagnosis of SARS-CoV-2 by  FDA under an Emergency Use Authorization (EUA). This EUA will remain  in effect (meaning this test can be used) for the duration of the  Covid-19 declaration under Section 564(b)(1) of the Act, 21  U.S.C. section 360bbb-3(b)(1), unless the authorization is  terminated or revoked. Performed at [facility], [place] [address], [place], [place] [address]   CBC     Status: Abnormal   Collection Time: 04/04/20  4:12 AM  Result Value Ref Range   WBC 13.7 (H) 4.0 - 10.5 K/uL   RBC 4.11 3.87 - 5.11 MIL/uL   Hemoglobin 11.8 (L) 12.0 - 15.0 g/dL   HCT 39.0 36.0 - 46.0 %   MCV 94.9 80.0 - 100.0 fL   MCH 28.7 26.0 - 34.0 pg   MCHC 30.3 30.0 - 36.0 g/dL   RDW 14.5 11.5 - 15.5 %   Platelets 189 150 - 400 K/uL   nRBC 0.0 0.0 - 0.2 %    Comment: Performed at  [facility], [place] [address] [place], [place] [address]    Imaging / Studies: CT Abdomen Pelvis W Contrast  Result Date: 04/03/2020 CLINICAL DATA:  Evaluate for suspect diverticulitis. Left lower quadrant pain and nausea beginning this morning. EXAM: CT ABDOMEN AND PELVIS WITH CONTRAST TECHNIQUE: Multidetector CT imaging of the abdomen and pelvis was performed using the standard protocol following bolus administration of intravenous contrast. CONTRAST:  177mL OMNIPAQUE IOHEXOL 300 MG/ML  SOLN COMPARISON:  09/06/2019 FINDINGS: Lower chest: Lung bases demonstrate dependent bibasilar atelectasis. 5 mm nodule over the peripheral posterior right lower lobe. Position of this nodule slightly different compared to previous exams and is slightly larger as cannot be certain this represents the same nodule. Hepatobiliary: Possible subtle cholelithiasis. Liver and biliary tree are normal. Pancreas: 1.4 cm cystic lesion along the superior  border of the body of the pancreas not significantly changed. 1.4 cm low-density lesion just superior and anterior to the pancreatic tail without significant change. Spleen: Normal. Adrenals/Urinary Tract: Adrenal glands are normal. Kidneys are normal size without hydronephrosis or nephrolithiasis. Ureters and bladder are normal. Stomach/Bowel: Stomach is normal. Minimal focal dilatation of a proximal jejunal loop measuring 3.2 cm. Remaining small bowel is unremarkable. Appendix is normal. There is diverticulosis of the colon. There is mild acute diverticulitis of the descending colon over the left mid to lower abdomen. Mild adjacent inflammatory change and fluid. Possible small diverticular abscess versus inflamed diverticulum measuring 1.1 x 1.8 cm abutting the lateral wall of the descending colon. This communicates via a sinus tract more laterally with a small focus of air abutting the lateral wall of the abdomen. This small focus of air is contained and may  communicate with the colon. No definite free air. Vascular/Lymphatic: Abdominal aorta is normal caliber. No significant adenopathy. Reproductive: Normal. Other: None. Musculoskeletal: No acute findings. IMPRESSION: 1. Evidence of acute diverticulitis of the mid to distal descending colon with suggestion of a small associated diverticular abscess measuring 1.1 x 1.8 cm. Minimal dilatation of a short segment jejunal loop likely secondary ileus. 2. 1.4 cm cystic lesion over the body of the pancreas unchanged with adjacent oval cystic 1.4 cm lesion anterior superior to the pancreatic tail unchanged. Recommend follow-up CT 1 year. This recommendation follows ACR consensus guidelines: Management of Incidental Pancreatic Cysts: A White Paper of the ACR Incidental Findings Committee. [place] [id number]. 3.  Possible minimal cholelithiasis. 4. 5 mm nodule over the right lower lobe. Recommend follow-up CT 1 year. This recommendation follows the consensus statement: Guidelines for Management of Small Pulmonary Nodules Detected on CT Scans: A Statement from the [place] as published in Radiology 2005; 237:395-400. Online at: https://www.arnold.com/. Electronically Signed   By: Marin Olp M.D.   On: 04/03/2020 09:08    Medications / Allergies: per chart  Antibiotics: Anti-infectives (From admission, onward)   Start     Dose/Rate Route Frequency Ordered Stop   04/03/20 1800  piperacillin-tazobactam (ZOSYN) IVPB 3.375 g     3.375 g 12.5 mL/hr over 240 Minutes Intravenous Every 8 hours 04/03/20 1309     04/03/20 0945  piperacillin-tazobactam (ZOSYN) IVPB 3.375 g     3.375 g 100 mL/hr over 30 Minutes Intravenous  Once 04/03/20 0931 04/03/20 1039   04/03/20 0930  ciprofloxacin (CIPRO) IVPB 400 mg  Status:  Discontinued     400 mg 200 mL/hr over 60 Minutes Intravenous  Once 04/03/20 0924 04/03/20 0931   04/03/20 0930  metroNIDAZOLE (FLAGYL) IVPB 500 mg  Status:   Discontinued     500 mg 100 mL/hr over 60 Minutes Intravenous  Once 04/03/20 [id number] 04/03/20 0931        Note: Portions of this report may have been transcribed using voice recognition software. Every effort was made to ensure accuracy; however, inadvertent computerized transcription errors may be present.   Any transcriptional errors that result from this process are unintentional.     [name], MD, FACS, MASCRS Gastrointestinal and Minimally Invasive Surgery    [address], [place], [place] [address] [phone] Main / Paging [phone] Fax Please see Amion for pager number, especial 5pm - 7am.

## 2020-04-05 LAB — CBC
HCT: 38.3 % (ref 36.0–46.0)
Hemoglobin: 11.6 g/dL — ABNORMAL LOW (ref 12.0–15.0)
MCH: 29.1 pg (ref 26.0–34.0)
MCHC: 30.3 g/dL (ref 30.0–36.0)
MCV: 96.2 fL (ref 80.0–100.0)
Platelets: 173 10*3/uL (ref 150–400)
RBC: 3.98 MIL/uL (ref 3.87–5.11)
RDW: 14.4 % (ref 11.5–15.5)
WBC: 12.4 10*3/uL — ABNORMAL HIGH (ref 4.0–10.5)
nRBC: 0 % (ref 0.0–0.2)

## 2020-04-05 LAB — PHOSPHORUS: Phosphorus: 1.1 mg/dL — ABNORMAL LOW (ref 2.5–4.6)

## 2020-04-05 LAB — MAGNESIUM: Magnesium: 1.9 mg/dL (ref 1.7–2.4)

## 2020-04-05 LAB — POTASSIUM: Potassium: 4 mmol/L (ref 3.5–5.1)

## 2020-04-05 MED ORDER — SODIUM PHOSPHATES 45 MMOLE/15ML IV SOLN
30.0000 mmol | Freq: Once | INTRAVENOUS | Status: AC
  Administered 2020-04-05: 30 mmol via INTRAVENOUS
  Filled 2020-04-05: qty 10

## 2020-04-05 MED ORDER — AMPHETAMINE ER 12.5 MG PO TBED: 12.5000 mg | EXTENDED_RELEASE_TABLET | Freq: Every day | ORAL | Status: DC

## 2020-04-05 NOTE — Progress Notes (Signed)
[facility] Progress Note     Subjective: CC-  Overall feeling a little better but still having some left sided abdominal discomfort. Some nausea, no emesis. Tolerating most clears except coffee. Passing a little flatus, no BM.  Just took imitrex for a migraine. WBC down 12.4, afebrile  Objective: Vital signs in last 24 hours: Temp:  [97.7 F (36.5 C)-98.7 F (37.1 C)] 98.2 F (36.8 C) (05/03 0520) Pulse Rate:  [73-87] 73 (05/03 0520) Resp:  [12-18] 17 (05/03 0520) BP: (109-116)/(67-78) 109/68 (05/03 0520) SpO2:  [91 %-100 %] 91 % (05/03 0735) Last BM Date: 04/03/20  Intake/Output from previous day: 05/02 0701 - 05/03 0700 In: 2131.5 [P.O.:180; I.V.:1890.6; IV Piggyback:61] Out: -  Intake/Output this shift: No intake/output data recorded.  PE: Gen:  Alert, NAD, pleasant HEENT: EOM's intact, pupils equal and round Card:  RRR Pulm:  CTAB, no W/R/R, rate and effort normal Abd: Soft, mild distension, TTP LLQ>LUQ, no peritonitis, +BS  Lab Results:  Recent Labs    04/04/20 0412 04/05/20 0419  WBC 13.7* 12.4*  HGB 11.8* 11.6*  HCT 39.0 38.3  PLT 189 173   BMET Recent Labs    04/03/20 0600 04/05/20 0419  NA 138  --   K 3.7 4.0  CL 111  --   CO2 20*  --   GLUCOSE 73  --   BUN 23*  --   CREATININE 0.60  --   CALCIUM 7.7*  --    PT/INR No results for input(s): LABPROT, INR in the last 72 hours. CMP     Component Value Date/Time   NA 138 04/03/2020 0600   NA 145 06/29/2014 2116   K 4.0 04/05/2020 0419   K 3.5 06/29/2014 2116   CL 111 04/03/2020 0600   CL 114 (H) 06/29/2014 2116   CO2 20 (L) 04/03/2020 0600   CO2 23 06/29/2014 2116   GLUCOSE 73 04/03/2020 0600   GLUCOSE 78 06/29/2014 2116   BUN 23 (H) 04/03/2020 0600   BUN 14 06/29/2014 2116   CREATININE 0.60 04/03/2020 0600   CREATININE 0.62 06/29/2014 2116   CALCIUM 7.7 (L) 04/03/2020 0600   CALCIUM 8.5 06/29/2014 2116   PROT 6.3 (L) 04/03/2020 0600   PROT 6.9 10/27/2013 1657   ALBUMIN 3.6 04/03/2020 0600   ALBUMIN 3.7 10/27/2013 1657   AST 16 04/03/2020 0600   AST 21 10/27/2013 1657   ALT 16 04/03/2020 0600   ALT 15 10/27/2013 1657   ALKPHOS 84 04/03/2020 0600   ALKPHOS 116 10/27/2013 1657   BILITOT 0.4 04/03/2020 0600   BILITOT 0.3 10/27/2013 1657   GFRNONAA >60 04/03/2020 0600   GFRNONAA >60 06/29/2014 2116   GFRAA >60 04/03/2020 0600   GFRAA >60 06/29/2014 2116   Lipase     Component Value Date/Time   LIPASE 24 04/03/2020 0600   LIPASE 44 (L) 10/05/2013 0016       Studies/Results: No results found.  Anti-infectives: Anti-infectives (From admission, onward)   Start     Dose/Rate Route Frequency Ordered Stop   04/03/20 1800  piperacillin-tazobactam (ZOSYN) IVPB 3.375 g     3.375 g 12.5 mL/hr over 240 Minutes Intravenous Every 8 hours 04/03/20 1309     04/03/20 0945  piperacillin-tazobactam (ZOSYN) IVPB 3.375 g     3.375 g 100 mL/hr over 30 Minutes Intravenous  Once 04/03/20 0931 04/03/20 1039   04/03/20 0930  ciprofloxacin (CIPRO) IVPB 400 mg  Status:  Discontinued     400 mg  200 mL/hr over 60 Minutes Intravenous  Once 04/03/20 0924 04/03/20 0931   04/03/20 0930  metroNIDAZOLE (FLAGYL) IVPB 500 mg  Status:  Discontinued     500 mg 100 mL/hr over 60 Minutes Intravenous  Once 04/03/20 0924 04/03/20 0931       Assessment/Plan HTN GERD Anxiety Hx Etoh abuse Cystic lesion pancreas - unchanged, recommend follow up CT 1 year RLL nodule - recommend follow up CT 1 year  Acute diverticulitis with small associated diverticular abscess, 1.8 cm - 2nd hospitalization and 4th overall episode of diverticulitis - CT 5/1 showed evidence of acute diverticulitis of the mid to distal descending colon with suggestion of a small associated diverticular abscess measuring 1.1 x 1.8 cm - last colonoscopy 12/2019 Dr. Cristina Gong  ID - zosyn 5/1>> FEN - IVF, CLD. Replace P VTE - SCDs, lovenox Foley - none Follow up - TBD, Dr. Johney Maine if this acute bout  resolves without surgery  Plan: WBC trending down but still TTP LLQ and a little nauseated. Continue clear liquids, IV zosyn. Mobilize. Repeat labs in AM.   LOS: 2 days    Wellington [place], [facility] 04/05/2020, 9:34 AM Please see Amion for pager number during day hours 7:00am-4:30pm

## 2020-04-06 LAB — BASIC METABOLIC PANEL
Anion gap: 5 (ref 5–15)
BUN: <5 mg/dL — ABNORMAL LOW (ref 6–20)
CO2: 25 mmol/L (ref 22–32)
Calcium: 8.2 mg/dL — ABNORMAL LOW (ref 8.9–10.3)
Chloride: 107 mmol/L (ref 98–111)
Creatinine, Ser: 0.61 mg/dL (ref 0.44–1.00)
GFR calc Af Amer: >60 mL/min (ref >60)
GFR calc non Af Amer: >60 mL/min (ref >60)
Glucose, Bld: 116 mg/dL — ABNORMAL HIGH (ref 70–99)
Potassium: 4.3 mmol/L (ref 3.5–5.1)
Sodium: 137 mmol/L (ref 135–145)

## 2020-04-06 LAB — CBC
HCT: 35.4 % — ABNORMAL LOW (ref 36.0–46.0)
Hemoglobin: 10.8 g/dL — ABNORMAL LOW (ref 12.0–15.0)
MCH: 29.1 pg (ref 26.0–34.0)
MCHC: 30.5 g/dL (ref 30.0–36.0)
MCV: 95.4 fL (ref 80.0–100.0)
Platelets: 169 10*3/uL (ref 150–400)
RBC: 3.71 MIL/uL — ABNORMAL LOW (ref 3.87–5.11)
RDW: 14.1 % (ref 11.5–15.5)
WBC: 10.5 10*3/uL (ref 4.0–10.5)
nRBC: 0 % (ref 0.0–0.2)

## 2020-04-06 LAB — MAGNESIUM: Magnesium: 2 mg/dL (ref 1.7–2.4)

## 2020-04-06 LAB — PHOSPHORUS: Phosphorus: 1.9 mg/dL — ABNORMAL LOW (ref 2.5–4.6)

## 2020-04-06 MED ORDER — SODIUM PHOSPHATES 45 MMOLE/15ML IV SOLN
20.0000 mmol | Freq: Once | INTRAVENOUS | Status: AC
  Administered 2020-04-06: 20 mmol via INTRAVENOUS
  Filled 2020-04-06: qty 6.67

## 2020-04-06 MED ORDER — BOOST / RESOURCE BREEZE PO LIQD CUSTOM
1.0000 | Freq: Two times a day (BID) | ORAL | Status: DC
  Administered 2020-04-06 – 2020-04-11 (×5): 1 via ORAL

## 2020-04-06 NOTE — Progress Notes (Signed)
[facility] Progress Note     Subjective: CC-  Feeling about the same as yesterday. Continues to have left sided abdominal pain. No worse with liquids. Denies n/v. Passing flatus, no BM.  WBC normalized 10.5, afebrile  Objective: Vital signs in last 24 hours: Temp:  [97.9 F (36.6 C)-98.6 F (37 C)] 98 F (36.7 C) (05/04 0432) Pulse Rate:  [62-72] 62 (05/04 0432) Resp:  [16-18] 17 (05/04 0432) BP: (103-147)/(68-82) 144/82 (05/04 0432) SpO2:  [90 %-98 %] 96 % (05/04 0716) Last BM Date: 04/03/20  Intake/Output from previous day: 05/03 0701 - 05/04 0700 In: 2683.1 [P.O.:840; I.V.:1546.2; IV Piggyback:296.9] Out: -  Intake/Output this shift: No intake/output data recorded.  PE: Gen:  Alert, NAD, pleasant HEENT: EOM's intact, pupils equal and round Card:  RRR Pulm:  CTAB, no W/R/R, rate and effort normal Abd: Soft, mild distension, focal TTP LLQ>LUQ with some guarding, +BS   Lab Results:  Recent Labs    04/05/20 0419 04/06/20 0251  WBC 12.4* 10.5  HGB 11.6* 10.8*  HCT 38.3 35.4*  PLT 173 169   BMET Recent Labs    04/05/20 0419 04/06/20 0251  NA  --  137  K 4.0 4.3  CL  --  107  CO2  --  25  GLUCOSE  --  116*  BUN  --  <5*  CREATININE  --  0.61  CALCIUM  --  8.2*   PT/INR No results for input(s): LABPROT, INR in the last 72 hours. CMP     Component Value Date/Time   NA 137 04/06/2020 0251   NA 145 06/29/2014 2116   K 4.3 04/06/2020 0251   K 3.5 06/29/2014 2116   CL 107 04/06/2020 0251   CL 114 (H) 06/29/2014 2116   CO2 25 04/06/2020 0251   CO2 23 06/29/2014 2116   GLUCOSE 116 (H) 04/06/2020 0251   GLUCOSE 78 06/29/2014 2116   BUN <5 (L) 04/06/2020 0251   BUN 14 06/29/2014 2116   CREATININE 0.61 04/06/2020 0251   CREATININE 0.62 06/29/2014 2116   CALCIUM 8.2 (L) 04/06/2020 0251   CALCIUM 8.5 06/29/2014 2116   PROT 6.3 (L) 04/03/2020 0600   PROT 6.9 10/27/2013 1657   ALBUMIN 3.6 04/03/2020 0600   ALBUMIN 3.7 10/27/2013 1657   AST 16 04/03/2020 0600   AST 21 10/27/2013 1657   ALT 16 04/03/2020 0600   ALT 15 10/27/2013 1657   ALKPHOS 84 04/03/2020 0600   ALKPHOS 116 10/27/2013 1657   BILITOT 0.4 04/03/2020 0600   BILITOT 0.3 10/27/2013 1657   GFRNONAA >60 04/06/2020 0251   GFRNONAA >60 06/29/2014 2116   GFRAA >60 04/06/2020 0251   GFRAA >60 06/29/2014 2116   Lipase     Component Value Date/Time   LIPASE 24 04/03/2020 0600   LIPASE 44 (L) 10/05/2013 0016       Studies/Results: No results found.  Anti-infectives: Anti-infectives (From admission, onward)   Start     Dose/Rate Route Frequency Ordered Stop   04/03/20 1800  piperacillin-tazobactam (ZOSYN) IVPB 3.375 g     3.375 g 12.5 mL/hr over 240 Minutes Intravenous Every 8 hours 04/03/20 1309     04/03/20 0945  piperacillin-tazobactam (ZOSYN) IVPB 3.375 g     3.375 g 100 mL/hr over 30 Minutes Intravenous  Once 04/03/20 0931 04/03/20 1039   04/03/20 0930  ciprofloxacin (CIPRO) IVPB 400 mg  Status:  Discontinued     400 mg 200 mL/hr over 60 Minutes Intravenous  Once  04/03/20 0924 04/03/20 0931   04/03/20 0930  metroNIDAZOLE (FLAGYL) IVPB 500 mg  Status:  Discontinued     500 mg 100 mL/hr over 60 Minutes Intravenous  Once 04/03/20 0924 04/03/20 0931       Assessment/Plan HTN GERD Anxiety Hx Etoh abuse Cystic lesion pancreas - unchanged, recommend follow up CT 1 year RLL nodule - recommend follow up CT 1 year  Acute diverticulitis with small associated diverticular abscess, 1.8 cm - 2nd hospitalization and 4th overall episode of diverticulitis - CT 5/1 showed evidence of acute diverticulitis of the mid to distal descending colon with suggestion of a small associated diverticular abscess measuring 1.1 x 1.8 cm - last colonoscopy 12/2019 Dr. Cristina Gong  ID - zosyn 5/1>> FEN - IVF, FLD. Replace P VTE - SCDs, lovenox Foley - none Follow up - TBD, Dr. Johney Maine if this acute bout resolves without surgery  Plan: WBC normalized, patient  afebrile and she is tolerating clears but she is still tender on exam. Will advance to full liquids. Continue IV zosyn, mobilize. Repeat labs in AM. Despite normal WBC if she continues to have pain will consider repeat CT scan in 1-2 days.   LOS: 3 days    Wellington Hampshire, [facility] 04/06/2020, 10:02 AM Please see Amion for pager number during day hours 7:00am-4:30pm

## 2020-04-06 NOTE — Progress Notes (Signed)
At 1042 I paged pharmacy for the third time about restocking IV fluids for this patient. Paged initially at [place], and 0940. Still not sent via tube station.   Fluids not in tube station or either fluid bin in med room. RN has checked 3 separate times, at three different locations. Patient hasnt had fluids running since 0835 this morning.    [place], Therapist, sports

## 2020-04-06 NOTE — Plan of Care (Signed)
Plan of care reviewed and discussed with the patient. 

## 2020-04-07 LAB — BASIC METABOLIC PANEL
Anion gap: 5 (ref 5–15)
BUN: 6 mg/dL (ref 6–20)
CO2: 25 mmol/L (ref 22–32)
Calcium: 8.2 mg/dL — ABNORMAL LOW (ref 8.9–10.3)
Chloride: 109 mmol/L (ref 98–111)
Creatinine, Ser: 0.54 mg/dL (ref 0.44–1.00)
GFR calc Af Amer: >60 mL/min (ref >60)
GFR calc non Af Amer: >60 mL/min (ref >60)
Glucose, Bld: 103 mg/dL — ABNORMAL HIGH (ref 70–99)
Potassium: 4.2 mmol/L (ref 3.5–5.1)
Sodium: 139 mmol/L (ref 135–145)

## 2020-04-07 LAB — CBC
HCT: 33.6 % — ABNORMAL LOW (ref 36.0–46.0)
Hemoglobin: 10.3 g/dL — ABNORMAL LOW (ref 12.0–15.0)
MCH: 29.2 pg (ref 26.0–34.0)
MCHC: 30.7 g/dL (ref 30.0–36.0)
MCV: 95.2 fL (ref 80.0–100.0)
Platelets: 190 10*3/uL (ref 150–400)
RBC: 3.53 MIL/uL — ABNORMAL LOW (ref 3.87–5.11)
RDW: 14.1 % (ref 11.5–15.5)
WBC: 7.4 10*3/uL (ref 4.0–10.5)
nRBC: 0 % (ref 0.0–0.2)

## 2020-04-07 LAB — MAGNESIUM: Magnesium: 1.8 mg/dL (ref 1.7–2.4)

## 2020-04-07 LAB — PHOSPHORUS: Phosphorus: 2.6 mg/dL (ref 2.5–4.6)

## 2020-04-07 MED ORDER — LACTATED RINGERS IV BOLUS
500.0000 mL | Freq: Once | INTRAVENOUS | Status: AC
  Administered 2020-04-07: 500 mL via INTRAVENOUS

## 2020-04-07 NOTE — Progress Notes (Signed)
Blood pressure low last 2 readings.  Brooke Meuth notified. Orders given.

## 2020-04-07 NOTE — Plan of Care (Signed)
Plan of care reviewed and discussed with the patient. 

## 2020-04-07 NOTE — Progress Notes (Signed)
[id number] pain  Subjective: She says she is much better, but still very tender in the LLQ. Dilaudid x 4 yesterday, plus 2 mg this AM so far.  She is also on Neurontin 300 mg TID. Oxycodone x 1 yesterday.  Tolerated the full liquids well.     Objective: Vital signs in last 24 hours: Temp:  [97.8 F (36.6 C)-98.7 F (37.1 C)] 97.9 F (36.6 C) (05/05 0446) Pulse Rate:  [59-71] 61 (05/05 0821) Resp:  [14-16] 14 (05/05 0821) BP: (84-104)/(50-73) 86/50 (05/05 0821) SpO2:  [91 %-97 %] 95 % (05/05 0821) Last BM Date: 04/03/20 660 PO 1700 IV Urine x 5\ No Bm recorded Afebrile, VSSBP down this AM into the mid 80's, fluid bolus given WBC 7.4 H/H 10.3/33.6 Platelets 190 K CT 5/1:  . Evidence of acute diverticulitis of the mid to distal descending colon with suggestion of a small associated diverticular abscess measuring 1.1 x 1.8 cm. Minimal dilatation of a short segment jejunal loop likely secondary ileus.  Intake/Output from previous day: 05/04 0701 - 05/05 0700 In: 2384.1 [P.O.:660; I.V.:1317.2; IV Piggyback:406.9] Out: -  Intake/Output this shift: No intake/output data recorded.  General appearance: alert, cooperative and no distress Resp: clear to auscultation bilaterally GI: soft, still pretty tender LLQ, + BS, No BM recorded  Lab Results:  Recent Labs    04/06/20 0251 04/07/20 0303  WBC 10.5 7.4  HGB 10.8* 10.3*  HCT 35.4* 33.6*  PLT 169 190    BMET Recent Labs    04/06/20 0251 04/07/20 0303  NA 137 139  K 4.3 4.2  CL 107 109  CO2 25 25  GLUCOSE 116* 103*  BUN <5* 6  CREATININE 0.61 0.54  CALCIUM 8.2* 8.2*   PT/INR No results for input(s): LABPROT, INR in the last 72 hours.  Recent Labs  Lab 04/03/20 0600  AST 16  ALT 16  ALKPHOS 84  BILITOT 0.4  PROT 6.3*  ALBUMIN 3.6     Lipase     Component Value Date/Time   LIPASE 24 04/03/2020 0600   LIPASE 44 (L) 10/05/2013 0016     Medications: . Amphetamine ER  12.5 mg Oral Daily   . budesonide  2 mL Nebulization Daily  . cloNIDine  0.1 mg Oral QID  . enoxaparin (LOVENOX) injection  40 mg Subcutaneous Q24H  . feeding supplement  1 Container Oral BID BM  . FLUoxetine  60 mg Oral Daily  . fluticasone  2 spray Each Nare Daily  . folic acid  1 mg Oral Daily  . gabapentin  300 mg Oral TID with meals  . gabapentin  900 mg Oral QHS  . LORazepam  0-4 mg Intravenous Q12H  . multivitamin with minerals  1 tablet Oral Daily  . thiamine  100 mg Oral Daily   Or  . thiamine  100 mg Intravenous Daily  . topiramate  50 mg Oral Daily  . traZODone  100 mg Oral QHS    Assessment/Plan HTN GERD Anxiety Hx Etoh abuse Cystic lesion pancreas - unchanged, recommend follow up CT 1 year RLL nodule - recommend follow up CT 1 year  Acute diverticulitis with small associated diverticular abscess, 1.8 cm - 2nd hospitalization and 4th overall episode of diverticulitis -CT 5/1 showed evidence of acute diverticulitis of the mid to distal descending colon with suggestion of a small associated diverticular abscess measuring 1.1 x 1.8 cm - last colonoscopy 12/2019 Dr. Cristina Gong  ID -zosyn 5/1>> FEN -IVF, FLD. Replace P  VTE -SCDs, lovenox Foley -none Follow up -TBD, Dr. Johney Maine if this acute bout resolves without surgery  Plan:  She still seems pretty tender on my exam.  Continue IV Zosyn, and full liquids, if still having pain repeat CT in AM.      LOS: 4 days    Luanna Weesner 04/07/2020 Please see Amion

## 2020-04-08 ENCOUNTER — Inpatient Hospital Stay (HOSPITAL_COMMUNITY): Payer: Commercial Managed Care - PPO

## 2020-04-08 ENCOUNTER — Encounter (HOSPITAL_COMMUNITY): Payer: Self-pay

## 2020-04-08 LAB — CBC
HCT: 35.4 % — ABNORMAL LOW (ref 36.0–46.0)
Hemoglobin: 11 g/dL — ABNORMAL LOW (ref 12.0–15.0)
MCH: 29.8 pg (ref 26.0–34.0)
MCHC: 31.1 g/dL (ref 30.0–36.0)
MCV: 95.9 fL (ref 80.0–100.0)
Platelets: 221 10*3/uL (ref 150–400)
RBC: 3.69 MIL/uL — ABNORMAL LOW (ref 3.87–5.11)
RDW: 14.1 % (ref 11.5–15.5)
WBC: 6.9 10*3/uL (ref 4.0–10.5)
nRBC: 0 % (ref 0.0–0.2)

## 2020-04-08 LAB — BASIC METABOLIC PANEL WITH GFR
Anion gap: 8 (ref 5–15)
BUN: 5 mg/dL — ABNORMAL LOW (ref 6–20)
CO2: 26 mmol/L (ref 22–32)
Calcium: 8.4 mg/dL — ABNORMAL LOW (ref 8.9–10.3)
Chloride: 107 mmol/L (ref 98–111)
Creatinine, Ser: 0.73 mg/dL (ref 0.44–1.00)
GFR calc Af Amer: >60 mL/min
GFR calc non Af Amer: >60 mL/min
Glucose, Bld: 103 mg/dL — ABNORMAL HIGH (ref 70–99)
Potassium: 4.6 mmol/L (ref 3.5–5.1)
Sodium: 141 mmol/L (ref 135–145)

## 2020-04-08 LAB — CULTURE, BLOOD (ROUTINE X 2)
Culture: NO GROWTH
Culture: NO GROWTH
Special Requests: ADEQUATE
Special Requests: ADEQUATE

## 2020-04-08 MED ORDER — SODIUM CHLORIDE (PF) 0.9 % IJ SOLN
INTRAMUSCULAR | Status: AC
  Filled 2020-04-08: qty 50

## 2020-04-08 MED ORDER — IOHEXOL 9 MG/ML PO SOLN
ORAL | Status: AC
  Filled 2020-04-08: qty 1000

## 2020-04-08 MED ORDER — IOHEXOL 9 MG/ML PO SOLN
500.0000 mL | ORAL | Status: AC
  Administered 2020-04-08 (×2): 500 mL via ORAL

## 2020-04-08 MED ORDER — IOHEXOL 300 MG/ML  SOLN
100.0000 mL | Freq: Once | INTRAMUSCULAR | Status: AC | PRN
  Administered 2020-04-08: 100 mL via INTRAVENOUS

## 2020-04-08 NOTE — Progress Notes (Signed)
   Subjective/Chief Complaint: abdominalpain About the same no better and no worse    Objective: Vital signs in last 24 hours: Temp:  [97.8 F (36.6 C)-98.4 F (36.9 C)] 98.4 F (36.9 C) (05/06 0522) Pulse Rate:  [60-68] 60 (05/06 0522) Resp:  [15-18] 15 (05/06 0522) BP: (89-143)/(51-69) 143/51 (05/06 0522) SpO2:  [95 %-98 %] 95 % (05/06 0805) Last BM Date: 04/02/20(told pt to make sure she talks to MD/PA about this)  Intake/Output from previous day: 05/05 0701 - 05/06 0700 In: 630 [P.O.:630] Out: 3700 [Urine:3700] Intake/Output this shift: Total I/O In: 120 [P.O.:120] Out: 900 [Urine:900]   General appearance: alert, cooperative and no distress Resp: clear to auscultation bilaterally GI: soft, st tender LLQ, + BS, No BM recorded Neuro Mary and O x 4 non focal  CV  RRR no  JVD Skin  no rash or breakdown  HEENT  ears and nose normal  Neck supple NT Eyes no scleral icterus   Lab Results:  Recent Labs    04/07/20 0303 04/08/20 0252  WBC 7.4 6.9  HGB 10.3* 11.0*  HCT 33.6* 35.4*  PLT 190 221   BMET Recent Labs    04/07/20 0303 04/08/20 0252  NA 139 141  K 4.2 4.6  CL 109 107  CO2 25 26  GLUCOSE 103* 103*  BUN 6 5*  CREATININE 0.54 0.73  CALCIUM 8.2* 8.4*   PT/INR No results for input(s): LABPROT, INR in the last 72 hours. ABG No results for input(s): PHART, HCO3 in the last 72 hours.  Invalid input(s): PCO2, PO2  Studies/Results: No results found.  Anti-infectives: Anti-infectives (From admission, onward)   Start     Dose/Rate Route Frequency Ordered Stop   04/03/20 1800  piperacillin-tazobactam (ZOSYN) IVPB 3.375 g     3.375 g 12.5 mL/hr over 240 Minutes Intravenous Every 8 hours 04/03/20 1309     04/03/20 0945  piperacillin-tazobactam (ZOSYN) IVPB 3.375 g     3.375 g 100 mL/hr over 30 Minutes Intravenous  Once 04/03/20 0931 04/03/20 1039   04/03/20 0930  ciprofloxacin (CIPRO) IVPB 400 mg  Status:  Discontinued     400 mg 200 mL/hr over 60  Minutes Intravenous  Once 04/03/20 0924 04/03/20 0931   04/03/20 0930  metroNIDAZOLE (FLAGYL) IVPB 500 mg  Status:  Discontinued     500 mg 100 mL/hr over 60 Minutes Intravenous  Once 04/03/20 0924 04/03/20 0931      Assessment/Plan: HTN GERD Anxiety Hx Etoh abuse Cystic lesion pancreas - unchanged, recommend follow up CT 1 year RLL nodule - recommend follow up CT 1 year  Acute diverticulitis with small associated diverticular abscess, 1.8 cm - 2nd hospitalization and 4th overall episode of diverticulitis -CT 5/1 showed evidence of acute diverticulitis of the mid to distal descending colon with suggestion of Mary small associated diverticular abscess measuring 1.1 x 1.8 cm - last colonoscopy 12/2019 Dr. Cristina Gong  ID -zosyn 5/1>> FEN -IVF,FLD. Replace P VTE -SCDs, lovenox Foley -none Follow up -TBD, Dr. Johney Maine if this acute bout resolves without surgery  Plan:  Repeat CT scan since pain not resolved   LOS: 5 days    [name] [name] 04/08/2020

## 2020-04-08 NOTE — Plan of Care (Signed)
  Problem: Nutrition: Goal: Adequate nutrition will be maintained Outcome: Progressing   Problem: Coping: Goal: Level of anxiety will decrease Outcome: Progressing   Problem: Pain Managment: Goal: General experience of comfort will improve Outcome: Progressing   

## 2020-04-09 ENCOUNTER — Encounter (HOSPITAL_COMMUNITY): Payer: Self-pay

## 2020-04-09 ENCOUNTER — Inpatient Hospital Stay (HOSPITAL_COMMUNITY): Payer: Commercial Managed Care - PPO

## 2020-04-09 LAB — CBC
HCT: 36.4 % (ref 36.0–46.0)
Hemoglobin: 11.3 g/dL — ABNORMAL LOW (ref 12.0–15.0)
MCH: 29.5 pg (ref 26.0–34.0)
MCHC: 31 g/dL (ref 30.0–36.0)
MCV: 95 fL (ref 80.0–100.0)
Platelets: 225 10*3/uL (ref 150–400)
RBC: 3.83 MIL/uL — ABNORMAL LOW (ref 3.87–5.11)
RDW: 13.8 % (ref 11.5–15.5)
WBC: 12.5 10*3/uL — ABNORMAL HIGH (ref 4.0–10.5)
nRBC: 0 % (ref 0.0–0.2)

## 2020-04-09 LAB — PROTIME-INR
INR: 1.1 (ref 0.8–1.2)
Prothrombin Time: 13.9 seconds (ref 11.4–15.2)

## 2020-04-09 MED ORDER — NALOXONE HCL 0.4 MG/ML IJ SOLN
INTRAMUSCULAR | Status: AC
  Filled 2020-04-09: qty 1

## 2020-04-09 MED ORDER — FENTANYL CITRATE (PF) 100 MCG/2ML IJ SOLN
INTRAMUSCULAR | Status: AC
  Filled 2020-04-09: qty 2

## 2020-04-09 MED ORDER — FLUMAZENIL 0.5 MG/5ML IV SOLN
INTRAVENOUS | Status: AC
  Filled 2020-04-09: qty 5

## 2020-04-09 MED ORDER — MIDAZOLAM HCL 2 MG/2ML IJ SOLN
INTRAMUSCULAR | Status: AC | PRN
  Administered 2020-04-09: 1 mg via INTRAVENOUS
  Administered 2020-04-09 (×2): 0.5 mg via INTRAVENOUS

## 2020-04-09 MED ORDER — MIDAZOLAM HCL 2 MG/2ML IJ SOLN
INTRAMUSCULAR | Status: AC
  Filled 2020-04-09: qty 4

## 2020-04-09 MED ORDER — LIDOCAINE-EPINEPHRINE (PF) 1 %-1:200000 IJ SOLN
INTRAMUSCULAR | Status: AC | PRN
  Administered 2020-04-09 (×2): 10 mL via INTRADERMAL

## 2020-04-09 NOTE — Consult Note (Signed)
Chief Complaint: Patient was seen in consultation today for CT-guided aspiration possible drainage of left abdominal abscess Chief Complaint  Patient presents with  . Abdominal Pain     Referring Physician(s): Cornett,T  Supervising Physician: Sandi Mariscal  Patient Status: [facility] - In-pt  History of Present Illness: [name] is a 54 y.o. female with past medical history of ADHD, prior tobacco abuse, alcohol abuse, anxiety/depression, GERD, hypertension who was admitted to [facility] on 5/1 with left sided abdominal pain and diarrhea.  She  had previous admission last year for diverticulitis which was treated with antibiotics.  Only previous abdominal surgery includes C-section and bladder sling procedure.  Latest CT abdomen pelvis performed yesterday revealed: 1. Persistent acute diverticulitis of the descending colon, with progressive abscess along the left anterolateral abdominal wall, now measuring 2.3 x 3.8 x 5.2 cm. There is communication between the inflamed sigmoid colon and progressive abscess as described above. Surgical consultation recommended. 2. Interval development of a small left pleural effusion. 3. Stable 7 mm right lower lobe pulmonary nodule, unchanged since 09/06/2019. Non-contrast chest CT at 18-24 months (from today's scan) is considered optional for low-risk patients, but is recommended for high-risk patients. This recommendation follows the consensus statement: Guidelines for Management of Incidental Pulmonary Nodules Detected on CT Images: From the Fleischner Society 2017; Radiology 2017; 284:228-243. 4. Stable cysts within the pancreatic body and tail. These have not demonstrated any significant change since 01/04/2016 and are likely Benign.  She is currently afebrile, WBC 12.5, hemoglobin 11.3, platelets 225k, PT/INR normal, creatinine 0.73, COVID-19 negative.  Blood cultures negative.  Request now received from surgical service for  CT-guided aspiration possible drainage of the left abdominal abscess.  Past Medical History:  Diagnosis Date  . ADHD (attention deficit hyperactivity disorder)   . Alcohol dependence with acute alcoholic intoxication with complication ([place]) 0000000  . Anemia   . Anxiety   . Cancer ([place])    bladder  . Cutaneous lupus erythematosus   . 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 GERD (gastroesophageal reflux disease)   . History of anemia   . Hypertension   . Severe episode of recurrent major depressive disorder, without psychotic features ([place]) 01/07/2020  . SUI (stress urinary incontinence, female)     Past Surgical History:  Procedure Laterality Date  . BLADDER SUSPENSION N/A 02/13/2013   Procedure: [facility];  Surgeon: Malka So, MD;  Location: [facility];  Service: Urology;  Laterality: N/A;  . CESAREAN SECTION  12-23-2004    W/ LEFT TUBAL LIGATION AND RIGHT SALPINGECTOMY  . CESAREAN SECTION  1995  . ORIF HUMERUS FRACTURE Right 04/28/2014   Procedure: OPEN REDUCTION INTERNAL FIXATION (ORIF) PROXIMAL HUMERUS FRACTURE;  Surgeon: Nita Sells, MD;  Location: [place];  Service: Orthopedics;  Laterality: Right;  . ORIF PROXIMAL HUMERUS FRACTURE Right 04/28/2014   DR CHANDLER  . TUBAL LIGATION      Allergies: Codeine, Hydrocodone-acetaminophen, Vicodin [hydrocodone-acetaminophen], and Hydrocodone  Medications: Prior to Admission medications   Medication Sig Start Date End Date Taking? Authorizing Provider  Acetylcysteine (N-ACETYL-L-CYSTEINE) 600 MG CAPS Take 600 mg by mouth in the morning, at noon, and at bedtime.   Yes [provider]  ADZENYS XR-ODT 12.5 MG TBED Take 25 mg by mouth daily.  11/28/18  Yes [provider]  BREZTRI AEROSPHERE 160-9-4.8 MCG/ACT AERO Inhale 1 puff into the lungs in the morning and at bedtime. 03/24/20  Yes [provider]  budesonide (  PULMICORT) 0.5 MG/2ML nebulizer solution Take 2  mLs by nebulization daily. 03/17/20  Yes [provider]  cloNIDine (CATAPRES) 0.1 MG tablet Take 0.1 mg by mouth 4 (four) times daily.   Yes [provider]  FLUoxetine HCl 60 MG TABS Take 60 mg by mouth daily. 08/25/19  Yes [provider]  fluticasone (FLONASE) 50 MCG/ACT nasal spray Place 2 sprays into the nose daily as needed for allergies or rhinitis.  01/28/19  Yes [provider]  gabapentin (NEURONTIN) 300 MG capsule Take 300-900 mg by mouth See admin instructions. Take 1 capsule (300mg ) by mouth three times daily and 3 capsules (900mg ) by mouth at bedtime.   Yes [provider]  hydrOXYzine (VISTARIL) 25 MG capsule Take 25 mg by mouth 2 (two) times daily as needed for anxiety. 08/07/19  Yes [provider]  ipratropium-albuterol (DUONEB) 0.5-2.5 (3) MG/3ML SOLN Take 3 mLs by nebulization every 6 (six) hours as needed. Patient taking differently: Take 3 mLs by nebulization every 6 (six) hours as needed (shortness of breath/wheezing).  09/10/19  Yes Tylene Fantasia, PA-C  PROAIR HFA 108 [phone] Base) MCG/ACT inhaler Inhale 2 puffs into the lungs every 4 (four) hours as needed for wheezing or shortness of breath.  11/24/18  Yes [provider]  progesterone (PROMETRIUM) 100 MG capsule Take 200 mg by mouth daily.  08/25/19  Yes [provider]  SUMAtriptan (IMITREX) 100 MG tablet Take 100 mg by mouth daily as needed for migraine. 03/09/20  Yes [provider]  topiramate (TOPAMAX) 50 MG tablet Take 50 mg by mouth daily. 03/08/20  Yes [provider]  traZODone (DESYREL) 100 MG tablet Take 100 mg by mouth at bedtime.   Yes [provider]  Turmeric 500 MG CAPS Take 500 mg by mouth daily.   Yes [provider]  Vitamin D, Ergocalciferol, (DRISDOL) 1.25 MG (50000 UNIT) CAPS capsule Take 50,000 Units by mouth once a week. 03/18/20  Yes [provider]     Family History  Adopted: Yes     Social History   Socioeconomic History  . Marital status: Married    Spouse name: Not on file  . Number of children: Not on file  . Years of education: Not on file  . Highest education level: Not on file  Occupational History  . Not on file  Tobacco Use  . Smoking status: Former Smoker    Packs/day: 0.25    Years: 20.00    Pack years: 5.00    Types: Cigarettes    Quit date: 09/24/2019    Years since quitting: 0.5  . Smokeless tobacco: Never Used  Substance and Sexual Activity  . Alcohol use: Yes    Comment: was in recovery for 4 months.   . Drug use: No  . Sexual activity: Not Currently    Birth control/protection: Post-menopausal  Other Topics Concern  . Not on file  Social History Narrative  . Not on file   Social Determinants of Health   Financial Resource Strain:   . Difficulty of Paying Living Expenses:   Food Insecurity:   . Worried About Charity fundraiser in the Last Year:   . Arboriculturist in the Last Year:   Transportation Needs:   . Film/video editor (Medical):   Marland Kitchen Lack of Transportation (Non-Medical):   Physical Activity:   . Days of Exercise per Week:   . Minutes of Exercise per Session:   Stress:   .  Feeling of Stress :   Social Connections:   . Frequency of Communication with Friends and Family:   . Frequency of Social Gatherings with Friends and Family:   . Attends Religious Services:   . Active Member of Clubs or Organizations:   . Attends Archivist Meetings:   Marland Kitchen Marital Status:       Review of Systems denies fever, chest pain, worsening dyspnea, cough, back pain, nausea, vomiting or bleeding.  He does have occasional headaches, left abdominal pain and some intermittent mild wheezes.  Vital Signs: BP (!) 104/58 (BP Location: Left Arm)   Pulse 81   Temp 99.1 F (37.3 C) (Oral)   Resp 16   Ht 5\' 5"  (1.651 m)   Wt 148 lb (67.1 kg)   SpO2 93%   BMI 24.63 kg/m   Physical Exam awake, alert.  Chest with slightly  diminished breath sounds left base, right clear ,occasional faint wheeze.  Heart with regular rate/ rhythm.  Abdomen soft, positive bowel sounds, moderately tender left lateral abdominal region to palpation.  No lower extremity edema.  Imaging: MR BRAIN W WO CONTRAST  Result Date: 03/25/2020 CLINICAL DATA:  Migraine headaches over the last year but coming more frequent. EXAM: MRI HEAD WITHOUT AND WITH CONTRAST TECHNIQUE: Multiplanar, multiecho pulse sequences of the brain and surrounding structures were obtained without and with intravenous contrast. CONTRAST:  25mL GADAVIST GADOBUTROL 1 MMOL/ML IV SOLN COMPARISON:  Head CT 11/23/2016 FINDINGS: Brain: The brain has a normal appearance without evidence of malformation, atrophy, old or acute small or large vessel infarction, mass lesion, hemorrhage, hydrocephalus or extra-axial collection. Pituitary gland grossly normal. No abnormal enhancement of the brain or leptomeninges. Vascular: Major vessels at the base of the brain show flow. Venous sinuses appear patent. Skull and upper cervical spine: Normal. Sinuses/Orbits: Clear/normal. Other: None significant. IMPRESSION: Normal examination.  No finding associated with headache. Electronically Signed   By: Nelson Chimes M.D.   On: 03/25/2020 08:31   CT ABDOMEN PELVIS W CONTRAST  Result Date: 04/08/2020 CLINICAL DATA:  Abdominal pain, history of diverticulitis EXAM: CT ABDOMEN AND PELVIS WITH CONTRAST TECHNIQUE: Multidetector CT imaging of the abdomen and pelvis was performed using the standard protocol following bolus administration of intravenous contrast. CONTRAST:  116mL OMNIPAQUE IOHEXOL 300 MG/ML  SOLN COMPARISON:  04/03/2020 FINDINGS: Lower chest: Stable 7 mm right lower lobe pulmonary nodule. There are areas of subsegmental atelectasis. Interval development of a small left pleural effusion volume estimated less than 100 cc. The Hepatobiliary: Gallbladder sludge is identified. No evidence of acute  cholecystitis. Liver is unremarkable. Pancreas: Stable cysts within the pancreatic body and tail. Remainder of the pancreas is unremarkable. Spleen: Normal in size without focal abnormality. Adrenals/Urinary Tract: Adrenal glands are unremarkable. Kidneys are normal, without renal calculi, focal lesion, or hydronephrosis. Bladder is unremarkable. Stomach/Bowel: The diverticulitis of the descending colon is again identified. There is a fistulous connection between the descending colon and left lateral abdominal wall. Progressive abscess along the left anterolateral abdominal wall reference image 37, measuring 2.3 x 3.8 by 5.2 cm. Gas is again seen within the fistulous tract, best visualized on images 34-37. There is no bowel obstruction. There is a normal appendix in the central abdomen. Vascular/Lymphatic: No significant vascular findings are present. No enlarged abdominal or pelvic lymph nodes. Reproductive: Uterus and bilateral adnexa are unremarkable. Other: Trace free fluid within the pelvis. No free intraperitoneal gas. Musculoskeletal: No acute or destructive bony lesions. Reconstructed images demonstrate no additional findings.  IMPRESSION: 1. Persistent acute diverticulitis of the descending colon, with progressive abscess along the left anterolateral abdominal wall, now measuring 2.3 x 3.8 x 5.2 cm. There is communication between the inflamed sigmoid colon and progressive abscess as described above. Surgical consultation recommended. 2. Interval development of a small left pleural effusion. 3. Stable 7 mm right lower lobe pulmonary nodule, unchanged since 09/06/2019. Non-contrast chest CT at 18-24 months (from today's scan) is considered optional for low-risk patients, but is recommended for high-risk patients. This recommendation follows the consensus statement: Guidelines for Management of Incidental Pulmonary Nodules Detected on CT Images: From the Fleischner Society 2017; Radiology 2017; 284:228-243. 4.  Stable cysts within the pancreatic body and tail. These have not demonstrated any significant change since 01/04/2016 and are likely benign. Electronically Signed   By: Randa Ngo M.D.   On: 04/08/2020 16:20   CT Abdomen Pelvis W Contrast  Result Date: 04/03/2020 CLINICAL DATA:  Evaluate for suspect diverticulitis. Left lower quadrant pain and nausea beginning this morning. EXAM: CT ABDOMEN AND PELVIS WITH CONTRAST TECHNIQUE: Multidetector CT imaging of the abdomen and pelvis was performed using the standard protocol following bolus administration of intravenous contrast. CONTRAST:  13mL OMNIPAQUE IOHEXOL 300 MG/ML  SOLN COMPARISON:  09/06/2019 FINDINGS: Lower chest: Lung bases demonstrate dependent bibasilar atelectasis. 5 mm nodule over the peripheral posterior right lower lobe. Position of this nodule slightly different compared to previous exams and is slightly larger as cannot be certain this represents the same nodule. Hepatobiliary: Possible subtle cholelithiasis. Liver and biliary tree are normal. Pancreas: 1.4 cm cystic lesion along the superior border of the body of the pancreas not significantly changed. 1.4 cm low-density lesion just superior and anterior to the pancreatic tail without significant change. Spleen: Normal. Adrenals/Urinary Tract: Adrenal glands are normal. Kidneys are normal size without hydronephrosis or nephrolithiasis. Ureters and bladder are normal. Stomach/Bowel: Stomach is normal. Minimal focal dilatation of a proximal jejunal loop measuring 3.2 cm. Remaining small bowel is unremarkable. Appendix is normal. There is diverticulosis of the colon. There is mild acute diverticulitis of the descending colon over the left mid to lower abdomen. Mild adjacent inflammatory change and fluid. Possible small diverticular abscess versus inflamed diverticulum measuring 1.1 x 1.8 cm abutting the lateral wall of the descending colon. This communicates via a sinus tract more laterally with a  small focus of air abutting the lateral wall of the abdomen. This small focus of air is contained and may communicate with the colon. No definite free air. Vascular/Lymphatic: Abdominal aorta is normal caliber. No significant adenopathy. Reproductive: Normal. Other: None. Musculoskeletal: No acute findings. IMPRESSION: 1. Evidence of acute diverticulitis of the mid to distal descending colon with suggestion of a small associated diverticular abscess measuring 1.1 x 1.8 cm. Minimal dilatation of a short segment jejunal loop likely secondary ileus. 2. 1.4 cm cystic lesion over the body of the pancreas unchanged with adjacent oval cystic 1.4 cm lesion anterior superior to the pancreatic tail unchanged. Recommend follow-up CT 1 year. This recommendation follows ACR consensus guidelines: Management of Incidental Pancreatic Cysts: A White Paper of the ACR Incidental Findings Committee. [place] [id number]. 3.  Possible minimal cholelithiasis. 4. 5 mm nodule over the right lower lobe. Recommend follow-up CT 1 year. This recommendation follows the consensus statement: Guidelines for Management of Small Pulmonary Nodules Detected on CT Scans: A Statement from the [place] as published in Radiology 2005; 237:395-400. Online at: [email or web address]/. Electronically Signed   By: Marin Olp M.D.  On: 04/03/2020 09:08    Labs:  CBC: Recent Labs    04/06/20 0251 04/07/20 0303 04/08/20 0252 04/09/20 0029  WBC 10.5 7.4 6.9 12.5*  HGB 10.8* 10.3* 11.0* 11.3*  HCT 35.4* 33.6* 35.4* 36.4  PLT 169 190 221 225    COAGS: Recent Labs    04/09/20 0921  INR 1.1    BMP: Recent Labs    04/03/20 0600 04/03/20 0600 04/05/20 0419 04/06/20 0251 04/07/20 0303 04/08/20 0252  NA 138  --   --  137 139 141  K 3.7   < > 4.0 4.3 4.2 4.6  CL 111  --   --  107 109 107  CO2 20*  --   --  25 25 26   GLUCOSE 73  --   --  116* 103* 103*  BUN 23*  --   --  <5* 6  5*  CALCIUM 7.7*  --   --  8.2* 8.2* 8.4*  CREATININE 0.60  --   --  0.61 0.54 0.73  GFRNONAA >60  --   --  >60 >60 >60  GFRAA >60  --   --  >60 >60 >60   < > = values in this interval not displayed.    LIVER FUNCTION TESTS: Recent Labs    09/06/19 1325 09/07/19 0614 04/03/20 0600  BILITOT 1.4* 1.2 0.4  AST 14* 10* 16  ALT 13 11 16   ALKPHOS 107 75 84  PROT 7.6 5.8* 6.3*  ALBUMIN 4.3 3.3* 3.6    TUMOR MARKERS: No results for input(s): AFPTM, CEA, CA199, CHROMGRNA in the last 8760 hours.  Assessment and Plan: 54 y.o. female with past medical history of ADHD, prior tobacco abuse, alcohol abuse, anxiety/depression, GERD, hypertension who was admitted to [facility] on 5/1 with left sided abdominal pain and diarrhea.  She  had previous admission last year for diverticulitis which was treated with antibiotics.  Only previous abdominal surgery includes C-section and bladder sling procedure.  Latest CT abdomen pelvis performed yesterday revealed: 1. Persistent acute diverticulitis of the descending colon, with progressive abscess along the left anterolateral abdominal wall, now measuring 2.3 x 3.8 x 5.2 cm. There is communication between the inflamed sigmoid colon and progressive abscess as described above. Surgical consultation recommended. 2. Interval development of a small left pleural effusion. 3. Stable 7 mm right lower lobe pulmonary nodule, unchanged since 09/06/2019. Non-contrast chest CT at 18-24 months (from today's scan) is considered optional for low-risk patients, but is recommended for high-risk patients. This recommendation follows the consensus statement: Guidelines for Management of Incidental Pulmonary Nodules Detected on CT Images: From the Fleischner Society 2017; Radiology 2017; 284:228-243. 4. Stable cysts within the pancreatic body and tail. These have not demonstrated any significant change since 01/04/2016 and are likely Benign.  She is currently  afebrile, WBC 12.5, hemoglobin 11.3, platelets 225k, PT/INR normal, creatinine 0.73, COVID-19 negative.  Blood cultures negative.  Request now received from surgical service for CT-guided aspiration possible drainage of the left abdominal abscess.  Imaging studies have been reviewed by Dr. Pascal Lux.Risks and benefits discussed with the patient/spouse including bleeding, infection, damage to adjacent structures, bowel perforation/fistula connection, and sepsis along with possible inability to place drain and only aspirate collection.  All of the patient's questions were answered, patient is agreeable to proceed. Consent signed and in chart.     Thank you for this interesting consult.  I greatly enjoyed meeting [name] and look forward to participating in their care.  A copy of this report was sent to the requesting provider on this date.  Electronically Signed: D. Rowe Robert, PA-C 04/09/2020, 9:45 AM   I spent a total of 25 minutes  in face to face in clinical consultation, greater than 50% of which was counseling/coordinating care for CT-guided aspiration/possible drainage of left abdominal abscess

## 2020-04-09 NOTE — Procedures (Signed)
Pre procedural Dx: Diverticular abscess Post procedural Dx: Same  Technically successful CT guided placed of a 10 Fr drainage catheter placement into the left sided ventral abdominal wall yielding 5 cc of purulent fluid.   Technically successful CT guided placed of a 10 Fr drainage catheter placement into the pericolonic diverticular abscess yielding 5 cc of purulent fluid.    A representative aspirated sample was capped and sent to the laboratory for analysis.    EBL: Trace Complications: None immediate  Ronny Bacon, MD Pager #: [phone]

## 2020-04-09 NOTE — Progress Notes (Signed)
Subjective/Chief Complaint:ABDOMINAL PAIN  Pain about the same  CT shows intraabdominal abscess   Objective: Vital signs in last 24 hours: Temp:  [98.9 F (37.2 C)-99.1 F (37.3 C)] 99.1 F (37.3 C) (05/07 0528) Pulse Rate:  [69-81] 81 (05/07 0528) Resp:  [16-17] 16 (05/07 0528) BP: (98-106)/(58-62) 104/58 (05/07 0528) SpO2:  [94 %-96 %] 94 % (05/07 0528) Last BM Date: 04/02/20  Intake/Output from previous day: 05/06 0701 - 05/07 0700 In: 2999.8 [P.O.:580; I.V.:2150.9; IV Piggyback:268.9] Out: 4250 [Urine:4250] Intake/Output this shift: No intake/output data recorded.   General appearance: alert, cooperative and no distress Resp: clear to auscultation bilaterally GI: soft, still pretty tender LLQ, + BS, Lab Results:  Recent Labs    04/08/20 0252 04/09/20 0029  WBC 6.9 12.5*  HGB 11.0* 11.3*  HCT 35.4* 36.4  PLT 221 225   BMET Recent Labs    04/07/20 0303 04/08/20 0252  NA 139 141  K 4.2 4.6  CL 109 107  CO2 25 26  GLUCOSE 103* 103*  BUN 6 5*  CREATININE 0.54 0.73  CALCIUM 8.2* 8.4*   PT/INR No results for input(s): LABPROT, INR in the last 72 hours. ABG No results for input(s): PHART, HCO3 in the last 72 hours.  Invalid input(s): PCO2, PO2  Studies/Results: CT ABDOMEN PELVIS W CONTRAST  Result Date: 04/08/2020 CLINICAL DATA:  Abdominal pain, history of diverticulitis EXAM: CT ABDOMEN AND PELVIS WITH CONTRAST TECHNIQUE: Multidetector CT imaging of the abdomen and pelvis was performed using the standard protocol following bolus administration of intravenous contrast. CONTRAST:  164mL OMNIPAQUE IOHEXOL 300 MG/ML  SOLN COMPARISON:  04/03/2020 FINDINGS: Lower chest: Stable 7 mm right lower lobe pulmonary nodule. There are areas of subsegmental atelectasis. Interval development of a small left pleural effusion volume estimated less than 100 cc. The Hepatobiliary: Gallbladder sludge is identified. No evidence of acute cholecystitis. Liver is unremarkable.  Pancreas: Stable cysts within the pancreatic body and tail. Remainder of the pancreas is unremarkable. Spleen: Normal in size without focal abnormality. Adrenals/Urinary Tract: Adrenal glands are unremarkable. Kidneys are normal, without renal calculi, focal lesion, or hydronephrosis. Bladder is unremarkable. Stomach/Bowel: The diverticulitis of the descending colon is again identified. There is a fistulous connection between the descending colon and left lateral abdominal wall. Progressive abscess along the left anterolateral abdominal wall reference image 37, measuring 2.3 x 3.8 by 5.2 cm. Gas is again seen within the fistulous tract, best visualized on images 34-37. There is no bowel obstruction. There is a normal appendix in the central abdomen. Vascular/Lymphatic: No significant vascular findings are present. No enlarged abdominal or pelvic lymph nodes. Reproductive: Uterus and bilateral adnexa are unremarkable. Other: Trace free fluid within the pelvis. No free intraperitoneal gas. Musculoskeletal: No acute or destructive bony lesions. Reconstructed images demonstrate no additional findings. IMPRESSION: 1. Persistent acute diverticulitis of the descending colon, with progressive abscess along the left anterolateral abdominal wall, now measuring 2.3 x 3.8 x 5.2 cm. There is communication between the inflamed sigmoid colon and progressive abscess as described above. Surgical consultation recommended. 2. Interval development of a small left pleural effusion. 3. Stable 7 mm right lower lobe pulmonary nodule, unchanged since 09/06/2019. Non-contrast chest CT at 18-24 months (from today's scan) is considered optional for low-risk patients, but is recommended for high-risk patients. This recommendation follows the consensus statement: Guidelines for Management of Incidental Pulmonary Nodules Detected on CT Images: From the Fleischner Society 2017; Radiology 2017; 284:228-243. 4. Stable cysts within the pancreatic  body and tail.  These have not demonstrated any significant change since 01/04/2016 and are likely benign. Electronically Signed   By: Randa Ngo M.D.   On: 04/08/2020 16:20    Anti-infectives: Anti-infectives (From admission, onward)   Start     Dose/Rate Route Frequency Ordered Stop   04/03/20 1800  piperacillin-tazobactam (ZOSYN) IVPB 3.375 g     3.375 g 12.5 mL/hr over 240 Minutes Intravenous Every 8 hours 04/03/20 1309     04/03/20 0945  piperacillin-tazobactam (ZOSYN) IVPB 3.375 g     3.375 g 100 mL/hr over 30 Minutes Intravenous  Once 04/03/20 0931 04/03/20 1039   04/03/20 0930  ciprofloxacin (CIPRO) IVPB 400 mg  Status:  Discontinued     400 mg 200 mL/hr over 60 Minutes Intravenous  Once 04/03/20 0924 04/03/20 0931   04/03/20 0930  metroNIDAZOLE (FLAGYL) IVPB 500 mg  Status:  Discontinued     500 mg 100 mL/hr over 60 Minutes Intravenous  Once 04/03/20 0924 04/03/20 0931      Assessment/Plan: HTN GERD Anxiety Hx Etoh abuse Cystic lesion pancreas - unchanged, recommend follow up CT 1 year RLL nodule - recommend follow up CT 1 year  Acute diverticulitis with small associated diverticular abscess, 1.8 cm - 2nd hospitalization and 4th overall episode of diverticulitis -CT 5/1 showed evidence of acute diverticulitis of the mid to distal descending colon with suggestion of a small associated diverticular abscess measuring 1.1 x 1.8 cm - last colonoscopy 12/2019 Dr. Cristina Gong CT - abscess- IR consult for drain  ID -zosyn 5/1>> FEN -IVF,FLD. Replace P VTE -SCDs, lovenox Foley -none Follow up -TBD, Dr. Johney Maine if this acute bout resolves without surgery   LOS: 6 days    Joyice Faster Dalene Robards 04/09/2020

## 2020-04-09 NOTE — Plan of Care (Signed)
  Problem: Health Behavior/Discharge Planning: Goal: Ability to manage health-related needs will improve Outcome: Progressing   Problem: Nutrition: Goal: Adequate nutrition will be maintained Outcome: Progressing   Problem: Pain Managment: Goal: General experience of comfort will improve Outcome: Progressing   

## 2020-04-09 NOTE — Sedation Documentation (Signed)
Patient is resting comfortably, responding when spoken to. Dozing lightly. Awaiting arrival of Dr Pascal Lux for procedure.

## 2020-04-10 LAB — CREATININE, SERUM
Creatinine, Ser: 0.6 mg/dL (ref 0.44–1.00)
GFR calc Af Amer: >60 mL/min (ref >60)
GFR calc non Af Amer: >60 mL/min (ref >60)

## 2020-04-10 MED ORDER — ENOXAPARIN SODIUM 40 MG/0.4ML ~~LOC~~ SOLN
40.0000 mg | SUBCUTANEOUS | Status: DC
  Administered 2020-04-11 – 2020-04-12 (×2): 40 mg via SUBCUTANEOUS
  Filled 2020-04-10 (×2): qty 0.4

## 2020-04-10 MED ORDER — SODIUM CHLORIDE 0.9% FLUSH
5.0000 mL | Freq: Three times a day (TID) | INTRAVENOUS | Status: DC
  Administered 2020-04-10 – 2020-04-12 (×5): 5 mL

## 2020-04-10 NOTE — Progress Notes (Signed)
MEDICATION-RELATED CONSULT NOTE   IR Procedure Consult - Anticoagulant/Antiplatelet PTA/Inpatient Med List Review by Pharmacist    Procedure: Technically successful CT guided placed of a 10 Fr drainage catheter placement into theleft sided ventral abdominal wallyielding 5 cc of purulent fluid.  Technically successful CT guided placed of a 10 Fr drainage catheter placement into thepericolonic diverticular abscessyielding5 cc of purulent fluid.     Completed: 04/10/2020  Post-Procedural bleeding risk per IR MD assessment:   - Standard  Antithrombotic medications on inpatient or PTA profile prior to procedure:    - enoxaparin 40 mg SQ daily   Recommended restart time per IR Post-Procedure Guidelines:   -   Day + 1 (Next AM)      Other considerations:      Plan:       Adjust enoxaparin 40 mg daily to resume 04/11/2020 at 10:00  Royetta Asal, PharmD, BCPS 04/10/2020 7:40 PM

## 2020-04-10 NOTE — Progress Notes (Signed)
Subjective/Chief Complaint: Less pain today Had IR drain placed yesterday Tolerating full liquids Having BM's   Objective: Vital signs in last 24 hours: Temp:  [98.3 F (36.8 C)-98.5 F (36.9 C)] 98.5 F (36.9 C) (05/08 0649) Pulse Rate:  [68-78] 71 (05/08 0649) Resp:  [16-19] 18 (05/08 0649) BP: (77-106)/(52-73) 98/73 (05/08 0649) SpO2:  [93 %-99 %] 96 % (05/08 0737) Last BM Date: 04/09/20  Intake/Output from previous day: 05/07 0701 - 05/08 0700 In: 460 [P.O.:460] Out: 3255 [Urine:3150; Drains:105] Intake/Output this shift: No intake/output data recorded.  Exam: Awake, looks comfortable Abdomen soft, mildly tender left side, drain purulent  Lab Results:  Recent Labs    04/08/20 0252 04/09/20 0029  WBC 6.9 12.5*  HGB 11.0* 11.3*  HCT 35.4* 36.4  PLT 221 225   BMET Recent Labs    04/08/20 0252 04/10/20 0535  NA 141  --   K 4.6  --   CL 107  --   CO2 26  --   GLUCOSE 103*  --   BUN 5*  --   CREATININE 0.73 0.60  CALCIUM 8.4*  --    PT/INR Recent Labs    04/09/20 0921  LABPROT 13.9  INR 1.1   ABG No results for input(s): PHART, HCO3 in the last 72 hours.  Invalid input(s): PCO2, PO2  Studies/Results: CT ABDOMEN PELVIS W CONTRAST  Result Date: 04/08/2020 CLINICAL DATA:  Abdominal pain, history of diverticulitis EXAM: CT ABDOMEN AND PELVIS WITH CONTRAST TECHNIQUE: Multidetector CT imaging of the abdomen and pelvis was performed using the standard protocol following bolus administration of intravenous contrast. CONTRAST:  164mL OMNIPAQUE IOHEXOL 300 MG/ML  SOLN COMPARISON:  04/03/2020 FINDINGS: Lower chest: Stable 7 mm right lower lobe pulmonary nodule. There are areas of subsegmental atelectasis. Interval development of a small left pleural effusion volume estimated less than 100 cc. The Hepatobiliary: Gallbladder sludge is identified. No evidence of acute cholecystitis. Liver is unremarkable. Pancreas: Stable cysts within the pancreatic body and  tail. Remainder of the pancreas is unremarkable. Spleen: Normal in size without focal abnormality. Adrenals/Urinary Tract: Adrenal glands are unremarkable. Kidneys are normal, without renal calculi, focal lesion, or hydronephrosis. Bladder is unremarkable. Stomach/Bowel: The diverticulitis of the descending colon is again identified. There is a fistulous connection between the descending colon and left lateral abdominal wall. Progressive abscess along the left anterolateral abdominal wall reference image 37, measuring 2.3 x 3.8 by 5.2 cm. Gas is again seen within the fistulous tract, best visualized on images 34-37. There is no bowel obstruction. There is a normal appendix in the central abdomen. Vascular/Lymphatic: No significant vascular findings are present. No enlarged abdominal or pelvic lymph nodes. Reproductive: Uterus and bilateral adnexa are unremarkable. Other: Trace free fluid within the pelvis. No free intraperitoneal gas. Musculoskeletal: No acute or destructive bony lesions. Reconstructed images demonstrate no additional findings. IMPRESSION: 1. Persistent acute diverticulitis of the descending colon, with progressive abscess along the left anterolateral abdominal wall, now measuring 2.3 x 3.8 x 5.2 cm. There is communication between the inflamed sigmoid colon and progressive abscess as described above. Surgical consultation recommended. 2. Interval development of a small left pleural effusion. 3. Stable 7 mm right lower lobe pulmonary nodule, unchanged since 09/06/2019. Non-contrast chest CT at 18-24 months (from today's scan) is considered optional for low-risk patients, but is recommended for high-risk patients. This recommendation follows the consensus statement: Guidelines for Management of Incidental Pulmonary Nodules Detected on CT Images: From the Fleischner Society 2017; Radiology 2017; 284:228-243.  4. Stable cysts within the pancreatic body and tail. These have not demonstrated any  significant change since 01/04/2016 and are likely benign. Electronically Signed   By: Randa Ngo M.D.   On: 04/08/2020 16:20   CT IMAGE GUIDED DRAINAGE BY PERCUTANEOUS CATHETER  Result Date: 04/09/2020 INDICATION: Recent diagnosis of diverticulitis, now with an abscess with ventral aspect of the left mid hemiabdomen as well as complex air and fluid collection adjacent to mid descending colon. Request made for CT-guided aspiration(s) / drainage catheter(s) placement for infection source control purposes. EXAM: CT IMAGE GUIDED DRAINAGE BY PERCUTANEOUS CATHETER x2 COMPARISON:  CT abdomen pelvis-04/08/2020; 04/03/2020 MEDICATIONS: The patient is currently admitted to the hospital and receiving intravenous antibiotics. The antibiotics were administered within an appropriate time frame prior to the initiation of the procedure. ANESTHESIA/SEDATION: Moderate (conscious) sedation was employed during this procedure. A total of Versed 2 mg was administered intravenously. Moderate Sedation Time: 31 minutes. The patient's level of consciousness and vital signs were monitored continuously by radiology nursing throughout the procedure under my direct supervision. CONTRAST:  None COMPLICATIONS: None immediate. PROCEDURE: Informed written consent was obtained from the patient after a discussion of the risks, benefits and alternatives to treatment. The patient was placed supine on the CT gantry and a pre procedural CT was performed re-demonstrating the known abscess/fluid collection within the ventral aspect of the left mid hemiabdomen with dominant component measuring approximately 3.5 x 1.9 cm (image 50, series 2) and additional complex mixed air and fluid pericolonic collection with dominant air containing collection adjacent to the mid descending colon measuring approximately 2.0 x 1.8 cm (image 55, series 2). The procedures were planned. A timeout was performed prior to the initiation of the procedures. The skin overlying  the lateral aspect the left mid hemiabdomen was prepped and draped in the usual sterile fashion. The overlying soft tissues were anesthetized with 1% lidocaine with epinephrine. Appropriate trajectory was planned with the use of a 22 gauge spinal needles. Next, a 18 gauge trocar needles were placed and short Amplatz wires were coiled within each collection. Appropriate positioning was confirmed with a limited CT scan. Both tracks were serially dilated allowing placement of a 10 [place] all-purpose drainage catheters, however note there is some difficulty coiling the drainage catheter within the more ventral collection ultimately requiring drainage catheter reposition. Appropriate positioning was confirmed with a limited postprocedural CT scan. A total of approximately 10 cc of purulent fluid was aspirated from both collections. The both drainage catheters were connected to gravity bags and sutured in place. Dressings were applied. The patient tolerated the procedure well without immediate post procedural complication. IMPRESSION: 1. Successful CT guided placement of a 10 French all purpose drain catheter into the abscess within the ventral aspect of the left mid hemiabdomen. 2. Successful CT-guided placement of a 10 French all-purpose drainage catheter into the dominant ill-defined complex air and fluid containing collection adjacent to the mid descending colon. 3. A total of approximately 10 cc purulent blood tinged fluid was aspirated both drainage catheters. A representative sample aspirated fluid was capped and sent to the laboratory for analysis. Electronically Signed   By: Sandi Mariscal M.D.   On: 04/09/2020 16:49   CT IMAGE GUIDED DRAINAGE BY PERCUTANEOUS CATHETER  Result Date: 04/09/2020 INDICATION: Recent diagnosis of diverticulitis, now with an abscess with ventral aspect of the left mid hemiabdomen as well as complex air and fluid collection adjacent to mid descending colon. Request made for CT-guided  aspiration(s) / drainage catheter(s)  placement for infection source control purposes. EXAM: CT IMAGE GUIDED DRAINAGE BY PERCUTANEOUS CATHETER x2 COMPARISON:  CT abdomen pelvis-04/08/2020; 04/03/2020 MEDICATIONS: The patient is currently admitted to the hospital and receiving intravenous antibiotics. The antibiotics were administered within an appropriate time frame prior to the initiation of the procedure. ANESTHESIA/SEDATION: Moderate (conscious) sedation was employed during this procedure. A total of Versed 2 mg was administered intravenously. Moderate Sedation Time: 31 minutes. The patient's level of consciousness and vital signs were monitored continuously by radiology nursing throughout the procedure under my direct supervision. CONTRAST:  None COMPLICATIONS: None immediate. PROCEDURE: Informed written consent was obtained from the patient after a discussion of the risks, benefits and alternatives to treatment. The patient was placed supine on the CT gantry and a pre procedural CT was performed re-demonstrating the known abscess/fluid collection within the ventral aspect of the left mid hemiabdomen with dominant component measuring approximately 3.5 x 1.9 cm (image 50, series 2) and additional complex mixed air and fluid pericolonic collection with dominant air containing collection adjacent to the mid descending colon measuring approximately 2.0 x 1.8 cm (image 55, series 2). The procedures were planned. A timeout was performed prior to the initiation of the procedures. The skin overlying the lateral aspect the left mid hemiabdomen was prepped and draped in the usual sterile fashion. The overlying soft tissues were anesthetized with 1% lidocaine with epinephrine. Appropriate trajectory was planned with the use of a 22 gauge spinal needles. Next, a 18 gauge trocar needles were placed and short Amplatz wires were coiled within each collection. Appropriate positioning was confirmed with a limited CT scan. Both  tracks were serially dilated allowing placement of a 10 [place] all-purpose drainage catheters, however note there is some difficulty coiling the drainage catheter within the more ventral collection ultimately requiring drainage catheter reposition. Appropriate positioning was confirmed with a limited postprocedural CT scan. A total of approximately 10 cc of purulent fluid was aspirated from both collections. The both drainage catheters were connected to gravity bags and sutured in place. Dressings were applied. The patient tolerated the procedure well without immediate post procedural complication. IMPRESSION: 1. Successful CT guided placement of a 10 French all purpose drain catheter into the abscess within the ventral aspect of the left mid hemiabdomen. 2. Successful CT-guided placement of a 10 French all-purpose drainage catheter into the dominant ill-defined complex air and fluid containing collection adjacent to the mid descending colon. 3. A total of approximately 10 cc purulent blood tinged fluid was aspirated both drainage catheters. A representative sample aspirated fluid was capped and sent to the laboratory for analysis. Electronically Signed   By: Sandi Mariscal M.D.   On: 04/09/2020 16:49    Anti-infectives: Anti-infectives (From admission, onward)   Start     Dose/Rate Route Frequency Ordered Stop   04/03/20 1800  piperacillin-tazobactam (ZOSYN) IVPB 3.375 g     3.375 g 12.5 mL/hr over 240 Minutes Intravenous Every 8 hours 04/03/20 1309     04/03/20 0945  piperacillin-tazobactam (ZOSYN) IVPB 3.375 g     3.375 g 100 mL/hr over 30 Minutes Intravenous  Once 04/03/20 0931 04/03/20 1039   04/03/20 0930  ciprofloxacin (CIPRO) IVPB 400 mg  Status:  Discontinued     400 mg 200 mL/hr over 60 Minutes Intravenous  Once 04/03/20 0924 04/03/20 0931   04/03/20 0930  metroNIDAZOLE (FLAGYL) IVPB 500 mg  Status:  Discontinued     500 mg 100 mL/hr over 60 Minutes Intravenous  Once 04/03/20 0924 04/03/20  0931      Assessment/Plan: HTN GERD Anxiety Hx Etoh abuse Cystic lesion pancreas - unchanged, recommend follow up CT 1 year RLL nodule - recommend follow up CT 1 year  Acute diverticulitis with small associated diverticular abscess, 1.8 cm  IR drain placed yesterday Will try soft diet Continue IV antibiotics Repeat CBC in the morning  LOS: 7 days    Coralie Keens 04/10/2020

## 2020-04-10 NOTE — Progress Notes (Signed)
Referring Physician(s): Dr Evlyn Courier  Supervising Physician: Jacqulynn Cadet  Patient Status:  [name] - In-pt  Chief Complaint:  Diverticular abscess Left abdominal abscess drain x 2  Subjective:  5/7 IR procedure: Technically successful CT guided placed of a 10 Fr drainage catheter placement into the left sided ventral abdominal wall yielding 5 cc of purulent fluid.   Technically successful CT guided placed of a 10 Fr drainage catheter placement into the pericolonic diverticular abscess yielding 5 cc of purulent fluid.   Painful abdomen Some nausea No vomit Pain is less today  OP purulent for both    Allergies: Codeine, Hydrocodone-acetaminophen, Vicodin [hydrocodone-acetaminophen], and Hydrocodone  Medications: Prior to Admission medications   Medication Sig Start Date End Date Taking? Authorizing Provider  Acetylcysteine (N-ACETYL-L-CYSTEINE) 600 MG CAPS Take 600 mg by mouth in the morning, at noon, and at bedtime.   Yes [provider]  ADZENYS XR-ODT 12.5 MG TBED Take 25 mg by mouth daily.  11/28/18  Yes [provider]  BREZTRI AEROSPHERE 160-9-4.8 MCG/ACT AERO Inhale 1 puff into the lungs in the morning and at bedtime. 03/24/20  Yes [provider]  budesonide (PULMICORT) 0.5 MG/2ML nebulizer solution Take 2 mLs by nebulization daily. 03/17/20  Yes [provider]  cloNIDine (CATAPRES) 0.1 MG tablet Take 0.1 mg by mouth 4 (four) times daily.   Yes [provider]  FLUoxetine HCl 60 MG TABS Take 60 mg by mouth daily. 08/25/19  Yes [provider]  fluticasone (FLONASE) 50 MCG/ACT nasal spray Place 2 sprays into the nose daily as needed for allergies or rhinitis.  01/28/19  Yes [provider]  gabapentin (NEURONTIN) 300 MG capsule Take 300-900 mg by mouth See admin instructions. Take 1 capsule (300mg ) by mouth three times daily and 3 capsules (900mg ) by mouth at bedtime.   Yes [provider]    hydrOXYzine (VISTARIL) 25 MG capsule Take 25 mg by mouth 2 (two) times daily as needed for anxiety. 08/07/19  Yes [provider]  ipratropium-albuterol (DUONEB) 0.5-2.5 (3) MG/3ML SOLN Take 3 mLs by nebulization every 6 (six) hours as needed. Patient taking differently: Take 3 mLs by nebulization every 6 (six) hours as needed (shortness of breath/wheezing).  09/10/19  Yes Tylene Fantasia, PA-C  PROAIR HFA 108 [id number] Base) MCG/ACT inhaler Inhale 2 puffs into the lungs every 4 (four) hours as needed for wheezing or shortness of breath.  11/24/18  Yes [provider]  progesterone (PROMETRIUM) 100 MG capsule Take 200 mg by mouth daily.  08/25/19  Yes [provider]  SUMAtriptan (IMITREX) 100 MG tablet Take 100 mg by mouth daily as needed for migraine. 03/09/20  Yes [provider]  topiramate (TOPAMAX) 50 MG tablet Take 50 mg by mouth daily. 03/08/20  Yes [provider]  traZODone (DESYREL) 100 MG tablet Take 100 mg by mouth at bedtime.   Yes [provider]  Turmeric 500 MG CAPS Take 500 mg by mouth daily.   Yes [provider]  Vitamin D, Ergocalciferol, (DRISDOL) 1.25 MG (50000 UNIT) CAPS capsule Take 50,000 Units by mouth once a week. 03/18/20  Yes [provider]     Vital Signs: BP 98/73 (BP Location: Left Arm)   Pulse 71   Temp 98.5 F (36.9 C) (Oral)   Resp 18   Ht 5\' 5"  (1.651 m)   Wt 148 lb (67.1 kg)   SpO2 96%   BMI 24.63 kg/m   Physical Exam Vitals  reviewed.  Skin:    General: Skin is warm and dry.     Comments: Sites are clean and dry Tender abd and tender sites Gram Stain ABUNDANT WBC PRESENT, PREDOMINANTLY PMN  FEW GRAM POSITIVE RODS     Neurological:     Mental Status: She is alert.     Imaging: CT ABDOMEN PELVIS W CONTRAST  Result Date: 04/08/2020 CLINICAL DATA:  Abdominal pain, history of diverticulitis EXAM: CT ABDOMEN AND PELVIS WITH CONTRAST TECHNIQUE: Multidetector CT imaging of the abdomen  and pelvis was performed using the standard protocol following bolus administration of intravenous contrast. CONTRAST:  122mL OMNIPAQUE IOHEXOL 300 MG/ML  SOLN COMPARISON:  04/03/2020 FINDINGS: Lower chest: Stable 7 mm right lower lobe pulmonary nodule. There are areas of subsegmental atelectasis. Interval development of a small left pleural effusion volume estimated less than 100 cc. The Hepatobiliary: Gallbladder sludge is identified. No evidence of acute cholecystitis. Liver is unremarkable. Pancreas: Stable cysts within the pancreatic body and tail. Remainder of the pancreas is unremarkable. Spleen: Normal in size without focal abnormality. Adrenals/Urinary Tract: Adrenal glands are unremarkable. Kidneys are normal, without renal calculi, focal lesion, or hydronephrosis. Bladder is unremarkable. Stomach/Bowel: The diverticulitis of the descending colon is again identified. There is a fistulous connection between the descending colon and left lateral abdominal wall. Progressive abscess along the left anterolateral abdominal wall reference image 37, measuring 2.3 x 3.8 by 5.2 cm. Gas is again seen within the fistulous tract, best visualized on images 34-37. There is no bowel obstruction. There is a normal appendix in the central abdomen. Vascular/Lymphatic: No significant vascular findings are present. No enlarged abdominal or pelvic lymph nodes. Reproductive: Uterus and bilateral adnexa are unremarkable. Other: Trace free fluid within the pelvis. No free intraperitoneal gas. Musculoskeletal: No acute or destructive bony lesions. Reconstructed images demonstrate no additional findings. IMPRESSION: 1. Persistent acute diverticulitis of the descending colon, with progressive abscess along the left anterolateral abdominal wall, now measuring 2.3 x 3.8 x 5.2 cm. There is communication between the inflamed sigmoid colon and progressive abscess as described above. Surgical consultation recommended. 2. Interval  development of a small left pleural effusion. 3. Stable 7 mm right lower lobe pulmonary nodule, unchanged since 09/06/2019. Non-contrast chest CT at 18-24 months (from today's scan) is considered optional for low-risk patients, but is recommended for high-risk patients. This recommendation follows the consensus statement: Guidelines for Management of Incidental Pulmonary Nodules Detected on CT Images: From the Fleischner Society 2017; Radiology 2017; 284:228-243. 4. Stable cysts within the pancreatic body and tail. These have not demonstrated any significant change since 01/04/2016 and are likely benign. Electronically Signed   By: Randa Ngo M.D.   On: 04/08/2020 16:20   CT IMAGE GUIDED DRAINAGE BY PERCUTANEOUS CATHETER  Result Date: 04/09/2020 INDICATION: Recent diagnosis of diverticulitis, now with an abscess with ventral aspect of the left mid hemiabdomen as well as complex air and fluid collection adjacent to mid descending colon. Request made for CT-guided aspiration(s) / drainage catheter(s) placement for infection source control purposes. EXAM: CT IMAGE GUIDED DRAINAGE BY PERCUTANEOUS CATHETER x2 COMPARISON:  CT abdomen pelvis-04/08/2020; 04/03/2020 MEDICATIONS: The patient is currently admitted to the hospital and receiving intravenous antibiotics. The antibiotics were administered within an appropriate time frame prior to the initiation of the procedure. ANESTHESIA/SEDATION: Moderate (conscious) sedation was employed during this procedure. A total of Versed 2 mg was administered intravenously. Moderate Sedation Time: 31 minutes. The patient's level of consciousness and vital signs were monitored continuously by radiology  nursing throughout the procedure under my direct supervision. CONTRAST:  None COMPLICATIONS: None immediate. PROCEDURE: Informed written consent was obtained from the patient after a discussion of the risks, benefits and alternatives to treatment. The patient was placed supine on the  CT gantry and a pre procedural CT was performed re-demonstrating the known abscess/fluid collection within the ventral aspect of the left mid hemiabdomen with dominant component measuring approximately 3.5 x 1.9 cm (image 50, series 2) and additional complex mixed air and fluid pericolonic collection with dominant air containing collection adjacent to the mid descending colon measuring approximately 2.0 x 1.8 cm (image 55, series 2). The procedures were planned. A timeout was performed prior to the initiation of the procedures. The skin overlying the lateral aspect the left mid hemiabdomen was prepped and draped in the usual sterile fashion. The overlying soft tissues were anesthetized with 1% lidocaine with epinephrine. Appropriate trajectory was planned with the use of a 22 gauge spinal needles. Next, a 18 gauge trocar needles were placed and short Amplatz wires were coiled within each collection. Appropriate positioning was confirmed with a limited CT scan. Both tracks were serially dilated allowing placement of a 10 [place] all-purpose drainage catheters, however note there is some difficulty coiling the drainage catheter within the more ventral collection ultimately requiring drainage catheter reposition. Appropriate positioning was confirmed with a limited postprocedural CT scan. A total of approximately 10 cc of purulent fluid was aspirated from both collections. The both drainage catheters were connected to gravity bags and sutured in place. Dressings were applied. The patient tolerated the procedure well without immediate post procedural complication. IMPRESSION: 1. Successful CT guided placement of a 10 French all purpose drain catheter into the abscess within the ventral aspect of the left mid hemiabdomen. 2. Successful CT-guided placement of a 10 French all-purpose drainage catheter into the dominant ill-defined complex air and fluid containing collection adjacent to the mid descending colon. 3. A total  of approximately 10 cc purulent blood tinged fluid was aspirated both drainage catheters. A representative sample aspirated fluid was capped and sent to the laboratory for analysis. Electronically Signed   By: Sandi Mariscal M.D.   On: 04/09/2020 16:49   CT IMAGE GUIDED DRAINAGE BY PERCUTANEOUS CATHETER  Result Date: 04/09/2020 INDICATION: Recent diagnosis of diverticulitis, now with an abscess with ventral aspect of the left mid hemiabdomen as well as complex air and fluid collection adjacent to mid descending colon. Request made for CT-guided aspiration(s) / drainage catheter(s) placement for infection source control purposes. EXAM: CT IMAGE GUIDED DRAINAGE BY PERCUTANEOUS CATHETER x2 COMPARISON:  CT abdomen pelvis-04/08/2020; 04/03/2020 MEDICATIONS: The patient is currently admitted to the hospital and receiving intravenous antibiotics. The antibiotics were administered within an appropriate time frame prior to the initiation of the procedure. ANESTHESIA/SEDATION: Moderate (conscious) sedation was employed during this procedure. A total of Versed 2 mg was administered intravenously. Moderate Sedation Time: 31 minutes. The patient's level of consciousness and vital signs were monitored continuously by radiology nursing throughout the procedure under my direct supervision. CONTRAST:  None COMPLICATIONS: None immediate. PROCEDURE: Informed written consent was obtained from the patient after a discussion of the risks, benefits and alternatives to treatment. The patient was placed supine on the CT gantry and a pre procedural CT was performed re-demonstrating the known abscess/fluid collection within the ventral aspect of the left mid hemiabdomen with dominant component measuring approximately 3.5 x 1.9 cm (image 50, series 2) and additional complex mixed air and fluid pericolonic collection with dominant  air containing collection adjacent to the mid descending colon measuring approximately 2.0 x 1.8 cm (image 55, series  2). The procedures were planned. A timeout was performed prior to the initiation of the procedures. The skin overlying the lateral aspect the left mid hemiabdomen was prepped and draped in the usual sterile fashion. The overlying soft tissues were anesthetized with 1% lidocaine with epinephrine. Appropriate trajectory was planned with the use of a 22 gauge spinal needles. Next, a 18 gauge trocar needles were placed and short Amplatz wires were coiled within each collection. Appropriate positioning was confirmed with a limited CT scan. Both tracks were serially dilated allowing placement of a 10 [place] all-purpose drainage catheters, however note there is some difficulty coiling the drainage catheter within the more ventral collection ultimately requiring drainage catheter reposition. Appropriate positioning was confirmed with a limited postprocedural CT scan. A total of approximately 10 cc of purulent fluid was aspirated from both collections. The both drainage catheters were connected to gravity bags and sutured in place. Dressings were applied. The patient tolerated the procedure well without immediate post procedural complication. IMPRESSION: 1. Successful CT guided placement of a 10 French all purpose drain catheter into the abscess within the ventral aspect of the left mid hemiabdomen. 2. Successful CT-guided placement of a 10 French all-purpose drainage catheter into the dominant ill-defined complex air and fluid containing collection adjacent to the mid descending colon. 3. A total of approximately 10 cc purulent blood tinged fluid was aspirated both drainage catheters. A representative sample aspirated fluid was capped and sent to the laboratory for analysis. Electronically Signed   By: Sandi Mariscal M.D.   On: 04/09/2020 16:49    Labs:  CBC: Recent Labs    04/06/20 0251 04/07/20 0303 04/08/20 0252 04/09/20 0029  WBC 10.5 7.4 6.9 12.5*  HGB 10.8* 10.3* 11.0* 11.3*  HCT 35.4* 33.6* 35.4* 36.4    PLT 169 190 221 225    COAGS: Recent Labs    04/09/20 0921  INR 1.1    BMP: Recent Labs    04/03/20 0600 04/03/20 0600 04/05/20 0419 04/06/20 0251 04/07/20 0303 04/08/20 0252 04/10/20 0535  NA 138  --   --  137 139 141  --   K 3.7   < > 4.0 4.3 4.2 4.6  --   CL 111  --   --  107 109 107  --   CO2 20*  --   --  25 25 26   --   GLUCOSE 73  --   --  116* 103* 103*  --   BUN 23*  --   --  <5* 6 5*  --   CALCIUM 7.7*  --   --  8.2* 8.2* 8.4*  --   CREATININE 0.60   < >  --  0.61 0.54 0.73 0.60  GFRNONAA >60   < >  --  >60 >60 >60 >60  GFRAA >60   < >  --  >60 >60 >60 >60   < > = values in this interval not displayed.    LIVER FUNCTION TESTS: Recent Labs    09/06/19 1325 09/07/19 0614 04/03/20 0600  BILITOT 1.4* 1.2 0.4  AST 14* 10* 16  ALT 13 11 16   ALKPHOS 107 75 84  PROT 7.6 5.8* 6.3*  ALBUMIN 4.3 3.3* 3.6    Assessment and Plan:  Diverticular abscess Drains intact Will follow  Electronically Signed: Lavonia Drafts, PA-C 04/10/2020, 11:12 AM   I spent a  total of 15 Minutes at the the patient's bedside AND on the patient's hospital floor or unit, greater than 50% of which was counseling/coordinating care for left abd drains x 2

## 2020-04-11 LAB — CBC
HCT: 33.5 % — ABNORMAL LOW (ref 36.0–46.0)
Hemoglobin: 10 g/dL — ABNORMAL LOW (ref 12.0–15.0)
MCH: 28.3 pg (ref 26.0–34.0)
MCHC: 29.9 g/dL — ABNORMAL LOW (ref 30.0–36.0)
MCV: 94.9 fL (ref 80.0–100.0)
Platelets: 231 10*3/uL (ref 150–400)
RBC: 3.53 MIL/uL — ABNORMAL LOW (ref 3.87–5.11)
RDW: 13.6 % (ref 11.5–15.5)
WBC: 6.7 10*3/uL (ref 4.0–10.5)
nRBC: 0 % (ref 0.0–0.2)

## 2020-04-11 NOTE — Progress Notes (Addendum)
Subjective/Chief Complaint: Still having some persistent LLQ pain. No fever and wbc normal   Objective: Vital signs in last 24 hours: Temp:  [98.1 F (36.7 C)-98.3 F (36.8 C)] 98.1 F (36.7 C) (05/09 0442) Pulse Rate:  [62-76] 62 (05/09 0442) Resp:  [16-18] 18 (05/09 0442) BP: (93-109)/(56-67) 109/66 (05/09 0442) SpO2:  [95 %-98 %] 95 % (05/09 0727) Last BM Date: 04/09/20  Intake/Output from previous day: 05/08 0701 - 05/09 0700 In: 2787.9 [P.O.:1440; I.V.:943.2; IV Piggyback:384.7] Out: 3185 [Urine:3050; Drains:135] Intake/Output this shift: Total I/O In: 356.9 [P.O.:240; I.V.:115; IV Piggyback:1.9] Out: -   General appearance: alert and cooperative Resp: clear to auscultation bilaterally Cardio: regular rate and rhythm GI: soft, still with some moderate LLQ tenderness. drain in place  Lab Results:  Recent Labs    04/09/20 0029 04/11/20 0429  WBC 12.5* 6.7  HGB 11.3* 10.0*  HCT 36.4 33.5*  PLT 225 231   BMET Recent Labs    04/10/20 0535  CREATININE 0.60   PT/INR Recent Labs    04/09/20 0921  LABPROT 13.9  INR 1.1   ABG No results for input(s): PHART, HCO3 in the last 72 hours.  Invalid input(s): PCO2, PO2  Studies/Results: CT IMAGE GUIDED DRAINAGE BY PERCUTANEOUS CATHETER  Result Date: 04/09/2020 INDICATION: Recent diagnosis of diverticulitis, now with an abscess with ventral aspect of the left mid hemiabdomen as well as complex air and fluid collection adjacent to mid descending colon. Request made for CT-guided aspiration(s) / drainage catheter(s) placement for infection source control purposes. EXAM: CT IMAGE GUIDED DRAINAGE BY PERCUTANEOUS CATHETER x2 COMPARISON:  CT abdomen pelvis-04/08/2020; 04/03/2020 MEDICATIONS: The patient is currently admitted to the hospital and receiving intravenous antibiotics. The antibiotics were administered within an appropriate time frame prior to the initiation of the procedure. ANESTHESIA/SEDATION: Moderate  (conscious) sedation was employed during this procedure. A total of Versed 2 mg was administered intravenously. Moderate Sedation Time: 31 minutes. The patient's level of consciousness and vital signs were monitored continuously by radiology nursing throughout the procedure under my direct supervision. CONTRAST:  None COMPLICATIONS: None immediate. PROCEDURE: Informed written consent was obtained from the patient after a discussion of the risks, benefits and alternatives to treatment. The patient was placed supine on the CT gantry and a pre procedural CT was performed re-demonstrating the known abscess/fluid collection within the ventral aspect of the left mid hemiabdomen with dominant component measuring approximately 3.5 x 1.9 cm (image 50, series 2) and additional complex mixed air and fluid pericolonic collection with dominant air containing collection adjacent to the mid descending colon measuring approximately 2.0 x 1.8 cm (image 55, series 2). The procedures were planned. A timeout was performed prior to the initiation of the procedures. The skin overlying the lateral aspect the left mid hemiabdomen was prepped and draped in the usual sterile fashion. The overlying soft tissues were anesthetized with 1% lidocaine with epinephrine. Appropriate trajectory was planned with the use of a 22 gauge spinal needles. Next, a 18 gauge trocar needles were placed and short Amplatz wires were coiled within each collection. Appropriate positioning was confirmed with a limited CT scan. Both tracks were serially dilated allowing placement of a 10 [place] all-purpose drainage catheters, however note there is some difficulty coiling the drainage catheter within the more ventral collection ultimately requiring drainage catheter reposition. Appropriate positioning was confirmed with a limited postprocedural CT scan. A total of approximately 10 cc of purulent fluid was aspirated from both collections. The both drainage catheters  were  connected to gravity bags and sutured in place. Dressings were applied. The patient tolerated the procedure well without immediate post procedural complication. IMPRESSION: 1. Successful CT guided placement of a 10 French all purpose drain catheter into the abscess within the ventral aspect of the left mid hemiabdomen. 2. Successful CT-guided placement of a 10 French all-purpose drainage catheter into the dominant ill-defined complex air and fluid containing collection adjacent to the mid descending colon. 3. A total of approximately 10 cc purulent blood tinged fluid was aspirated both drainage catheters. A representative sample aspirated fluid was capped and sent to the laboratory for analysis. Electronically Signed   By: Sandi Mariscal M.D.   On: 04/09/2020 16:49   CT IMAGE GUIDED DRAINAGE BY PERCUTANEOUS CATHETER  Result Date: 04/09/2020 INDICATION: Recent diagnosis of diverticulitis, now with an abscess with ventral aspect of the left mid hemiabdomen as well as complex air and fluid collection adjacent to mid descending colon. Request made for CT-guided aspiration(s) / drainage catheter(s) placement for infection source control purposes. EXAM: CT IMAGE GUIDED DRAINAGE BY PERCUTANEOUS CATHETER x2 COMPARISON:  CT abdomen pelvis-04/08/2020; 04/03/2020 MEDICATIONS: The patient is currently admitted to the hospital and receiving intravenous antibiotics. The antibiotics were administered within an appropriate time frame prior to the initiation of the procedure. ANESTHESIA/SEDATION: Moderate (conscious) sedation was employed during this procedure. A total of Versed 2 mg was administered intravenously. Moderate Sedation Time: 31 minutes. The patient's level of consciousness and vital signs were monitored continuously by radiology nursing throughout the procedure under my direct supervision. CONTRAST:  None COMPLICATIONS: None immediate. PROCEDURE: Informed written consent was obtained from the patient after a  discussion of the risks, benefits and alternatives to treatment. The patient was placed supine on the CT gantry and a pre procedural CT was performed re-demonstrating the known abscess/fluid collection within the ventral aspect of the left mid hemiabdomen with dominant component measuring approximately 3.5 x 1.9 cm (image 50, series 2) and additional complex mixed air and fluid pericolonic collection with dominant air containing collection adjacent to the mid descending colon measuring approximately 2.0 x 1.8 cm (image 55, series 2). The procedures were planned. A timeout was performed prior to the initiation of the procedures. The skin overlying the lateral aspect the left mid hemiabdomen was prepped and draped in the usual sterile fashion. The overlying soft tissues were anesthetized with 1% lidocaine with epinephrine. Appropriate trajectory was planned with the use of a 22 gauge spinal needles. Next, a 18 gauge trocar needles were placed and short Amplatz wires were coiled within each collection. Appropriate positioning was confirmed with a limited CT scan. Both tracks were serially dilated allowing placement of a 10 [place] all-purpose drainage catheters, however note there is some difficulty coiling the drainage catheter within the more ventral collection ultimately requiring drainage catheter reposition. Appropriate positioning was confirmed with a limited postprocedural CT scan. A total of approximately 10 cc of purulent fluid was aspirated from both collections. The both drainage catheters were connected to gravity bags and sutured in place. Dressings were applied. The patient tolerated the procedure well without immediate post procedural complication. IMPRESSION: 1. Successful CT guided placement of a 10 French all purpose drain catheter into the abscess within the ventral aspect of the left mid hemiabdomen. 2. Successful CT-guided placement of a 10 French all-purpose drainage catheter into the dominant  ill-defined complex air and fluid containing collection adjacent to the mid descending colon. 3. A total of approximately 10 cc purulent blood tinged fluid was aspirated  both drainage catheters. A representative sample aspirated fluid was capped and sent to the laboratory for analysis. Electronically Signed   By: Sandi Mariscal M.D.   On: 04/09/2020 16:49    Anti-infectives: Anti-infectives (From admission, onward)   Start     Dose/Rate Route Frequency Ordered Stop   04/03/20 1800  piperacillin-tazobactam (ZOSYN) IVPB 3.375 g     3.375 g 12.5 mL/hr over 240 Minutes Intravenous Every 8 hours 04/03/20 1309     04/03/20 0945  piperacillin-tazobactam (ZOSYN) IVPB 3.375 g     3.375 g 100 mL/hr over 30 Minutes Intravenous  Once 04/03/20 0931 04/03/20 1039   04/03/20 0930  ciprofloxacin (CIPRO) IVPB 400 mg  Status:  Discontinued     400 mg 200 mL/hr over 60 Minutes Intravenous  Once 04/03/20 0924 04/03/20 0931   04/03/20 0930  metroNIDAZOLE (FLAGYL) IVPB 500 mg  Status:  Discontinued     500 mg 100 mL/hr over 60 Minutes Intravenous  Once 04/03/20 0924 04/03/20 0931      Assessment/Plan: s/p * No surgery found *  Sigmoid diverticulitis with abscess Advance diet  Continue IV abx until pain improves Ambulate Continue drain  LOS: 8 days    [name] 04/11/2020

## 2020-04-11 NOTE — Plan of Care (Signed)
  Problem: Clinical Measurements: Goal: Respiratory complications will improve Outcome: Progressing   Problem: Clinical Measurements: Goal: Cardiovascular complication will be avoided Outcome: Progressing   Problem: Activity: Goal: Risk for activity intolerance will decrease Outcome: Progressing   Problem: Coping: Goal: Level of anxiety will decrease Outcome: Progressing   Problem: Elimination: Goal: Will not experience complications related to bowel motility Outcome: Progressing   Problem: Pain Managment: Goal: General experience of comfort will improve Outcome: Progressing

## 2020-04-12 ENCOUNTER — Other Ambulatory Visit: Payer: Self-pay | Admitting: Surgery

## 2020-04-12 DIAGNOSIS — K5720 Diverticulitis of large intestine with perforation and abscess without bleeding: Secondary | ICD-10-CM

## 2020-04-12 DIAGNOSIS — L0291 Cutaneous abscess, unspecified: Secondary | ICD-10-CM

## 2020-04-12 LAB — AEROBIC/ANAEROBIC CULTURE W GRAM STAIN (SURGICAL/DEEP WOUND): Special Requests: NORMAL

## 2020-04-12 MED ORDER — OXYCODONE HCL 5 MG PO TABS: 5.0000 mg | ORAL_TABLET | Freq: Four times a day (QID) | ORAL | 0 refills | Status: DC | PRN

## 2020-04-12 MED ORDER — ADULT MULTIVITAMIN W/MINERALS CH: 1.0000 | ORAL_TABLET | Freq: Every day | ORAL | Status: DC

## 2020-04-12 MED ORDER — AMOXICILLIN-POT CLAVULANATE 875-125 MG PO TABS
1.0000 | ORAL_TABLET | Freq: Two times a day (BID) | ORAL | 0 refills | Status: AC
Start: 2020-04-12 — End: 2020-04-17

## 2020-04-12 MED ORDER — ACETAMINOPHEN 325 MG PO TABS: 650.0000 mg | ORAL_TABLET | Freq: Four times a day (QID) | ORAL | Status: DC | PRN

## 2020-04-12 MED ORDER — SODIUM CHLORIDE 0.9% FLUSH: 5.0000 mL | Freq: Two times a day (BID) | INTRAVENOUS | 1 refills | Status: DC

## 2020-04-12 NOTE — Discharge Summary (Signed)
Physician Discharge Summary    Patient ID: [name] MRN: [id number] DOB/AGE: 1966/09/04  54 y.o.  Patient Care Team: [place] Pink, MD as PCP - General (Family Medicine) Ronald Lobo, MD as Consulting Physician (Gastroenterology) Vladimir Crofts, MD as Consulting Physician (Neurology) Ottie Glazier, MD as Consulting Physician (Pulmonary Disease) Michael Boston, MD as Consulting Physician (Colon and Rectal Surgery)  Admit date: 04/03/2020  Discharge date: 04/12/2020 Hospital Stay = 9 days    Discharge Diagnoses:  Principal Problem:   Diverticulitis of large intestine with perforation and abscess Active Problems:   Attention deficit hyperactivity disorder (ADHD), predominantly inattentive type   GAD (generalized anxiety disorder)   MDD (major depressive disorder)   History of alcoholism ([place])   Intractable migraine with aura without status migrainosus   SUI (stress urinary incontinence, female)   Hypertension   History of anemia   GERD (gastroesophageal reflux disease)      * No surgery found *  Consults: None  Hospital Course:   The patient was admitted on 04/03/2020 with persistent abdominal pain and history of diverticulitis, alcohol abuse, anxiety, GERD, and HTN. This was her 4th episode of diverticulitis, 2 of which have required hospitalization. CT showed evidence of perforation with abscess formation, and she was treated aggressively with IV Zosyn and fluids.  She will follow up in our office in 3 weeks to discuss elective colectomy. She should follow up with IR for drain studies and will be discharged home with Augmentin x5 days.  By the time of discharge, the patient was walking well the hallways, eating food, having flatus.  Pain was well-controlled on an oral medications.  Based on meeting discharge criteria and continuing to recover, I felt it was safe for the patient to be discharged from the hospital to further recover with close followup. Postoperative  recommendations were discussed in detail.  They are written as well.  Discharged Condition: good  Discharge Exam: Blood pressure (!) 97/56, pulse 71, temperature 98.5 F (36.9 C), temperature source Oral, resp. rate 16, height 5\' 5"  (1.651 m), weight 67.1 kg, SpO2 92 %.  General: Pt awake/alert/oriented x4 in No acute distress Eyes: PERRL, normal EOM.  Sclera clear.  No icterus Neuro: CN II-XII intact w/o focal sensory/motor deficits. Lymph: No head/neck/groin lymphadenopathy Psych:  No delerium/psychosis/paranoia HENT: Normocephalic, Mucus membranes moist.  No thrush Neck: Supple, No tracheal deviation Chest: RRR. No M/R/G. No chest wall pain w good excursion CV:  Pulses intact.  Regular rhythm MS: Normal AROM mjr joints.  No obvious deformity Abdomen: Soft.  Nondistended.  Nontender.  No evidence of peritonitis.  No incarcerated hernias. Ext:  SCDs BLE.  No mjr edema.  No cyanosis Skin: No petechiae / purpura   Disposition:   Follow-up Information    Michael Boston, MD Follow up in 3 week(s).   Specialty: General Surgery Contact information: [address] [place] [address] [phone]               Significant Diagnostic Studies:  Results for orders placed or performed during the hospital encounter of 04/03/20 (from the past 72 hour(s))  Aerobic/Anaerobic Culture (surgical/deep wound)     Status: Abnormal (Preliminary result)   Collection Time: 04/09/20  4:20 PM   Specimen: Abdomen; Abscess  Result Value Ref Range   Specimen Description      ABDOMEN ABSCESS Performed at [facility] [address] [place], [place] [address]    Special Requests  Normal Performed at [facility], [place] [address] [place], [place] [address]    Gram Stain      ABUNDANT WBC PRESENT, PREDOMINANTLY PMN FEW GRAM POSITIVE RODS    Culture (A)     MULTIPLE ORGANISMS PRESENT, NONE PREDOMINANT HOLDING FOR POSSIBLE ANAEROBE  Performed at [facility], [place] [address] [place], [place] [address]    Report Status PENDING   Creatinine, serum     Status: None   Collection Time: 04/10/20  5:35 AM  Result Value Ref Range   Creatinine, Ser 0.60 0.44 - 1.00 mg/dL   GFR calc non Af Amer >60 >60 mL/min   GFR calc Af Amer >60 >60 mL/min    Comment: Performed at [facility], [place] [address], [place], [place] [address]  CBC     Status: Abnormal   Collection Time: 04/11/20  4:29 AM  Result Value Ref Range   WBC 6.7 4.0 - 10.5 K/uL   RBC 3.53 (L) 3.87 - 5.11 MIL/uL   Hemoglobin 10.0 (L) 12.0 - 15.0 g/dL   HCT 33.5 (L) 36.0 - 46.0 %   MCV 94.9 80.0 - 100.0 fL   MCH 28.3 26.0 - 34.0 pg   MCHC 29.9 (L) 30.0 - 36.0 g/dL   RDW 13.6 11.5 - 15.5 %   Platelets 231 150 - 400 K/uL   nRBC 0.0 0.0 - 0.2 %    Comment: Performed at [facility], [place] [address] [place], [place] [address]    CT Abdomen Pelvis W Contrast  Result Date: 04/03/2020 CLINICAL DATA:  Evaluate for suspect diverticulitis. Left lower quadrant pain and nausea beginning this morning. EXAM: CT ABDOMEN AND PELVIS WITH CONTRAST TECHNIQUE: Multidetector CT imaging of the abdomen and pelvis was performed using the standard protocol following bolus administration of intravenous contrast. CONTRAST:  190mL OMNIPAQUE IOHEXOL 300 MG/ML  SOLN COMPARISON:  09/06/2019 FINDINGS: Lower chest: Lung bases demonstrate dependent bibasilar atelectasis. 5 mm nodule over the peripheral posterior right lower lobe. Position of this nodule slightly different compared to previous exams and is slightly larger as cannot be certain this represents the same nodule. Hepatobiliary: Possible subtle cholelithiasis. Liver and biliary tree are normal. Pancreas: 1.4 cm cystic lesion along the superior border of the body of the pancreas not significantly changed. 1.4 cm low-density lesion just superior and anterior to the pancreatic tail without significant  change. Spleen: Normal. Adrenals/Urinary Tract: Adrenal glands are normal. Kidneys are normal size without hydronephrosis or nephrolithiasis. Ureters and bladder are normal. Stomach/Bowel: Stomach is normal. Minimal focal dilatation of a proximal jejunal loop measuring 3.2 cm. Remaining small bowel is unremarkable. Appendix is normal. There is diverticulosis of the colon. There is mild acute diverticulitis of the descending colon over the left mid to lower abdomen. Mild adjacent inflammatory change and fluid. Possible small diverticular abscess versus inflamed diverticulum measuring 1.1 x 1.8 cm abutting the lateral wall of the descending colon. This communicates via a sinus tract more laterally with a small focus of air abutting the lateral wall of the abdomen. This small focus of air is contained and may communicate with the colon. No definite free air. Vascular/Lymphatic: Abdominal aorta is normal caliber. No significant adenopathy. Reproductive: Normal. Other: None. Musculoskeletal: No acute findings. IMPRESSION: 1. Evidence of acute diverticulitis of the mid to distal descending colon with suggestion of a small associated diverticular abscess measuring 1.1 x 1.8 cm. Minimal dilatation of a short segment jejunal loop likely secondary ileus. 2.  1.4 cm cystic lesion over the body of the pancreas unchanged with adjacent oval cystic 1.4 cm lesion anterior superior to the pancreatic tail unchanged. Recommend follow-up CT 1 year. This recommendation follows ACR consensus guidelines: Management of Incidental Pancreatic Cysts: A White Paper of the ACR Incidental Findings Committee. [place] [id number]. 3.  Possible minimal cholelithiasis. 4. 5 mm nodule over the right lower lobe. Recommend follow-up CT 1 year. This recommendation follows the consensus statement: Guidelines for Management of Small Pulmonary Nodules Detected on CT Scans: A Statement from the [place] as published in Radiology 2005;  237:395-400. Online at: https://www.arnold.com/. Electronically Signed   By: Marin Olp M.D.   On: 04/03/2020 09:08    Past Medical History:  Diagnosis Date  . ADHD (attention deficit hyperactivity disorder)   . Alcohol dependence with acute alcoholic intoxication with complication ([place]) 0000000  . Anemia   . Anxiety   . Cancer ([place])    bladder  . Cutaneous lupus erythematosus   . 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 GERD (gastroesophageal reflux disease)   . History of anemia   . Hypertension   . Severe episode of recurrent major depressive disorder, without psychotic features ([place]) 01/07/2020  . SUI (stress urinary incontinence, female)     Past Surgical History:  Procedure Laterality Date  . BLADDER SUSPENSION N/A 02/13/2013   Procedure: [facility];  Surgeon: Malka So, MD;  [facility];  Service: Urology;  Laterality: N/A;  . CESAREAN SECTION  12-23-2004    W/ LEFT TUBAL LIGATION AND RIGHT SALPINGECTOMY  . CESAREAN SECTION  1995  . ORIF HUMERUS FRACTURE Right 04/28/2014   Procedure: OPEN REDUCTION INTERNAL FIXATION (ORIF) PROXIMAL HUMERUS FRACTURE;  Surgeon: Nita Sells, MD;  Location: [facility];  Service: Orthopedics;  Laterality: Right;  . ORIF PROXIMAL HUMERUS FRACTURE Right 04/28/2014   DR CHANDLER  . TUBAL LIGATION      Social History   Socioeconomic History  . Marital status: Married    Spouse name: Not on file  . Number of children: Not on file  . Years of education: Not on file  . Highest education level: Not on file  Occupational History  . Not on file  Tobacco Use  . Smoking status: Former Smoker    Packs/day: 0.25    Years: 20.00    Pack years: 5.00    Types: Cigarettes    Quit date: 09/24/2019    Years since quitting: 0.5  . Smokeless tobacco: Never Used  Substance and Sexual Activity  . Alcohol use: Yes    Comment: was in recovery for 4 months.   . Drug use:  No  . Sexual activity: Not Currently    Birth control/protection: Post-menopausal  Other Topics Concern  . Not on file  Social History Narrative  . Not on file   Social Determinants of Health   Financial Resource Strain:   . Difficulty of Paying Living Expenses:   Food Insecurity:   . Worried About Charity fundraiser in the Last Year:   . Arboriculturist in the Last Year:   Transportation Needs:   . Film/video editor (Medical):   Marland Kitchen Lack of Transportation (Non-Medical):   Physical Activity:   . Days of Exercise per Week:   . Minutes of Exercise per Session:   Stress:   . Feeling of Stress :   Social Connections:   . Frequency of Communication with Friends  and Family:   . Frequency of Social Gatherings with Friends and Family:   . Attends Religious Services:   . Active Member of Clubs or Organizations:   . Attends Archivist Meetings:   Marland Kitchen Marital Status:   Intimate Partner Violence:   . Fear of Current or Ex-Partner:   . Emotionally Abused:   Marland Kitchen Physically Abused:   . Sexually Abused:     Family History  Adopted: Yes    Current Facility-Administered Medications  Medication Dose Route Frequency Provider Last Rate Last Admin  . acetaminophen (TYLENOL) tablet 650 mg  650 mg Oral Q6H PRN Armandina Gemma, MD   650 mg at 04/04/20 0108   Or  . acetaminophen (TYLENOL) suppository 650 mg  650 mg Rectal Q6H PRN Armandina Gemma, MD      . albuterol (PROVENTIL) (2.5 MG/3ML) 0.083% nebulizer solution 2.5 mg  2.5 mg Nebulization Q6H PRN Armandina Gemma, MD   2.5 mg at 04/04/20 0417  . Amphetamine ER TBED 12.5 mg  12.5 mg Oral Daily Michael Boston, MD      . budesonide (PULMICORT) nebulizer solution 0.5 mg  2 mL Nebulization Daily Armandina Gemma, MD   0.5 mg at 04/12/20 0820  . cloNIDine (CATAPRES) tablet 0.1 mg  0.1 mg Oral QID Armandina Gemma, MD   0.1 mg at 04/06/20 0947  . enoxaparin (LOVENOX) injection 40 mg  40 mg Subcutaneous Q24H Monia Sabal, PA-C   40 mg at 04/12/20 [id number]   . feeding supplement (BOOST / RESOURCE BREEZE) liquid 1 Container  1 Container Oral BID BM Meuth, Brooke A, PA-C   1 Container at 04/11/20 0827  . FLUoxetine (PROZAC) capsule 60 mg  60 mg Oral Daily Armandina Gemma, MD   60 mg at 04/12/20 0901  . fluticasone (FLONASE) 50 MCG/ACT nasal spray 2 spray  2 spray Each Nare Daily Michael Boston, MD   2 spray at 04/12/20 0908  . folic acid (FOLVITE) tablet 1 mg  1 mg Oral Daily Michael Boston, MD   1 mg at 04/12/20 0902  . gabapentin (NEURONTIN) capsule 300 mg  300 mg Oral TID with meals Armandina Gemma, MD   300 mg at 04/12/20 0902  . gabapentin (NEURONTIN) capsule 900 mg  900 mg Oral Teresita Madura, MD   900 mg at 04/11/20 2131  . HYDROmorphone (DILAUDID) injection 1 mg  1 mg Intravenous Q2H PRN Armandina Gemma, MD   1 mg at 04/12/20 0307  . hydrOXYzine (VISTARIL) capsule 25 mg  25 mg Oral BID PRN Armandina Gemma, MD      . ipratropium-albuterol (DUONEB) 0.5-2.5 (3) MG/3ML nebulizer solution 3 mL  3 mL Nebulization Q6H PRN Michael Boston, MD      . multivitamin with minerals tablet 1 tablet  1 tablet Oral Daily Michael Boston, MD   1 tablet at 04/12/20 0901  . ondansetron (ZOFRAN-ODT) disintegrating tablet 4 mg  4 mg Oral Q6H PRN Armandina Gemma, MD   4 mg at 04/11/20 1716   Or  . ondansetron (ZOFRAN) injection 4 mg  4 mg Intravenous Q6H PRN Armandina Gemma, MD   4 mg at 04/09/20 1632  . oxyCODONE (Oxy IR/ROXICODONE) immediate release tablet 5-10 mg  5-10 mg Oral Q4H PRN Armandina Gemma, MD   10 mg at 04/12/20 0617  . piperacillin-tazobactam (ZOSYN) IVPB 3.375 g  3.375 g Intravenous Margaretha Glassing, MD 12.5 mL/hr at 04/12/20 0906 3.375 g at 04/12/20 0906  . sodium chloride flush (NS) 0.9 % injection 5  mL  5 mL Intracatheter Q8H Sandi Mariscal, MD   5 mL at 04/12/20 0606  . SUMAtriptan (IMITREX) tablet 100 mg  100 mg Oral Daily PRN Michael Boston, MD   100 mg at 04/11/20 2131  . thiamine tablet 100 mg  100 mg Oral Daily Michael Boston, MD   100 mg at 04/12/20 0901   Or  .  thiamine (B-1) injection 100 mg  100 mg Intravenous Daily Michael Boston, MD      . topiramate (TOPAMAX) tablet 50 mg  50 mg Oral Daily Armandina Gemma, MD   50 mg at 04/12/20 0901  . traZODone (DESYREL) tablet 100 mg  100 mg Oral Teresita Madura, MD   100 mg at 04/11/20 2131     Allergies  Allergen Reactions  . Codeine Nausea And Vomiting and Other (See Comments)    dizziness Other reaction(s): Dizziness, Other (See Comments) dizziness  . Hydrocodone-Acetaminophen Nausea And Vomiting    Other reaction(s): Other (See Comments) dizzy dizzy  . Vicodin [Hydrocodone-Acetaminophen] Nausea And Vomiting and Other (See Comments)    dizzy  . Hydrocodone Nausea Only    Signed: Wilhemena Durie, PA-S Patient was seen and evaluated in coordination with Kandis Ban, MD   04/12/2020, 9:55 AM

## 2020-04-12 NOTE — Progress Notes (Signed)
Referring Physician(s): Cornett,T  Supervising Physician: Markus Daft  Patient Status:  [facility] - In-pt  Chief Complaint:  Abdominal/pelvic pain/abscesses  Subjective: Patient awaiting discharge home today; reports less abdominal/pelvic discomfort; denies nausea/ vomiting   Allergies: Codeine, Hydrocodone-acetaminophen, Vicodin [hydrocodone-acetaminophen], and Hydrocodone  Medications: Prior to Admission medications   Medication Sig Start Date End Date Taking? Authorizing Provider  Acetylcysteine (N-ACETYL-L-CYSTEINE) 600 MG CAPS Take 600 mg by mouth in the morning, at noon, and at bedtime.   Yes [provider]  ADZENYS XR-ODT 12.5 MG TBED Take 25 mg by mouth daily.  11/28/18  Yes [provider]  BREZTRI AEROSPHERE 160-9-4.8 MCG/ACT AERO Inhale 1 puff into the lungs in the morning and at bedtime. 03/24/20  Yes [provider]  budesonide (PULMICORT) 0.5 MG/2ML nebulizer solution Take 2 mLs by nebulization daily. 03/17/20  Yes [provider]  cloNIDine (CATAPRES) 0.1 MG tablet Take 0.1 mg by mouth 4 (four) times daily.   Yes [provider]  FLUoxetine HCl 60 MG TABS Take 60 mg by mouth daily. 08/25/19  Yes [provider]  fluticasone (FLONASE) 50 MCG/ACT nasal spray Place 2 sprays into the nose daily as needed for allergies or rhinitis.  01/28/19  Yes [provider]  gabapentin (NEURONTIN) 300 MG capsule Take 300-900 mg by mouth See admin instructions. Take 1 capsule (300mg ) by mouth three times daily and 3 capsules (900mg ) by mouth at bedtime.   Yes [provider]  hydrOXYzine (VISTARIL) 25 MG capsule Take 25 mg by mouth 2 (two) times daily as needed for anxiety. 08/07/19  Yes [provider]  ipratropium-albuterol (DUONEB) 0.5-2.5 (3) MG/3ML SOLN Take 3 mLs by nebulization every 6 (six) hours as needed. Patient taking differently: Take 3 mLs by nebulization every 6 (six) hours as needed (shortness of  breath/wheezing).  09/10/19  Yes Tylene Fantasia, PA-C  PROAIR HFA 108 [phone] Base) MCG/ACT inhaler Inhale 2 puffs into the lungs every 4 (four) hours as needed for wheezing or shortness of breath.  11/24/18  Yes [provider]  progesterone (PROMETRIUM) 100 MG capsule Take 200 mg by mouth daily.  08/25/19  Yes [provider]  SUMAtriptan (IMITREX) 100 MG tablet Take 100 mg by mouth daily as needed for migraine. 03/09/20  Yes [provider]  topiramate (TOPAMAX) 50 MG tablet Take 50 mg by mouth daily. 03/08/20  Yes [provider]  traZODone (DESYREL) 100 MG tablet Take 100 mg by mouth at bedtime.   Yes [provider]  Turmeric 500 MG CAPS Take 500 mg by mouth daily.   Yes [provider]  Vitamin D, Ergocalciferol, (DRISDOL) 1.25 MG (50000 UNIT) CAPS capsule Take 50,000 Units by mouth once a week. 03/18/20  Yes [provider]  acetaminophen (TYLENOL) 325 MG tablet Take 2 tablets (650 mg total) by mouth every 6 (six) hours as needed for mild pain (or temp > 100). 04/12/20   Meuth, Brooke A, PA-C  amoxicillin-clavulanate (AUGMENTIN) 875-125 MG tablet Take 1 tablet by mouth 2 (two) times daily for 5 days. 04/12/20 04/17/20  Meuth, Brooke A, PA-C  Multiple Vitamin (MULTIVITAMIN WITH MINERALS) TABS tablet Take 1 tablet by mouth daily. 04/13/20   Meuth, Brooke A, PA-C  oxyCODONE (OXY IR/ROXICODONE) 5 MG immediate release tablet Take 1 tablet (5 mg total) by mouth every 6 (six) hours as needed for severe pain. 04/12/20   Meuth, Brooke A, PA-C  sodium chloride flush (NS) 0.9 % SOLN 5 mLs by Intracatheter  route every 12 (twelve) hours. Flush each drain with 5cc normal saline twice daily 04/12/20   Meuth, Brooke A, PA-C     Vital Signs: BP (!) 85/52 (BP Location: Right Arm)   Pulse 65   Temp 98.4 F (36.9 C)   Resp 18   Ht 5\' 5"  (1.651 m)   Wt 148 lb (67.1 kg)   SpO2 97%   BMI 24.63 kg/m   Physical Exam awake, alert.  Left abdominal drains  intact, outputs with minimal amount of beige-colored to serous looking fluid.  Insertion sites okay, mildly tender to palpation  Imaging: CT ABDOMEN PELVIS W CONTRAST  Result Date: 04/08/2020 CLINICAL DATA:  Abdominal pain, history of diverticulitis EXAM: CT ABDOMEN AND PELVIS WITH CONTRAST TECHNIQUE: Multidetector CT imaging of the abdomen and pelvis was performed using the standard protocol following bolus administration of intravenous contrast. CONTRAST:  162mL OMNIPAQUE IOHEXOL 300 MG/ML  SOLN COMPARISON:  04/03/2020 FINDINGS: Lower chest: Stable 7 mm right lower lobe pulmonary nodule. There are areas of subsegmental atelectasis. Interval development of a small left pleural effusion volume estimated less than 100 cc. The Hepatobiliary: Gallbladder sludge is identified. No evidence of acute cholecystitis. Liver is unremarkable. Pancreas: Stable cysts within the pancreatic body and tail. Remainder of the pancreas is unremarkable. Spleen: Normal in size without focal abnormality. Adrenals/Urinary Tract: Adrenal glands are unremarkable. Kidneys are normal, without renal calculi, focal lesion, or hydronephrosis. Bladder is unremarkable. Stomach/Bowel: The diverticulitis of the descending colon is again identified. There is a fistulous connection between the descending colon and left lateral abdominal wall. Progressive abscess along the left anterolateral abdominal wall reference image 37, measuring 2.3 x 3.8 by 5.2 cm. Gas is again seen within the fistulous tract, best visualized on images 34-37. There is no bowel obstruction. There is a normal appendix in the central abdomen. Vascular/Lymphatic: No significant vascular findings are present. No enlarged abdominal or pelvic lymph nodes. Reproductive: Uterus and bilateral adnexa are unremarkable. Other: Trace free fluid within the pelvis. No free intraperitoneal gas. Musculoskeletal: No acute or destructive bony lesions. Reconstructed images demonstrate no  additional findings. IMPRESSION: 1. Persistent acute diverticulitis of the descending colon, with progressive abscess along the left anterolateral abdominal wall, now measuring 2.3 x 3.8 x 5.2 cm. There is communication between the inflamed sigmoid colon and progressive abscess as described above. Surgical consultation recommended. 2. Interval development of a small left pleural effusion. 3. Stable 7 mm right lower lobe pulmonary nodule, unchanged since 09/06/2019. Non-contrast chest CT at 18-24 months (from today's scan) is considered optional for low-risk patients, but is recommended for high-risk patients. This recommendation follows the consensus statement: Guidelines for Management of Incidental Pulmonary Nodules Detected on CT Images: From the Fleischner Society 2017; Radiology 2017; 284:228-243. 4. Stable cysts within the pancreatic body and tail. These have not demonstrated any significant change since 01/04/2016 and are likely benign. Electronically Signed   By: Randa Ngo M.D.   On: 04/08/2020 16:20   CT IMAGE GUIDED DRAINAGE BY PERCUTANEOUS CATHETER  Result Date: 04/09/2020 INDICATION: Recent diagnosis of diverticulitis, now with an abscess with ventral aspect of the left mid hemiabdomen as well as complex air and fluid collection adjacent to mid descending colon. Request made for CT-guided aspiration(s) / drainage catheter(s) placement for infection source control purposes. EXAM: CT IMAGE GUIDED DRAINAGE BY PERCUTANEOUS CATHETER x2 COMPARISON:  CT abdomen pelvis-04/08/2020; 04/03/2020 MEDICATIONS: The patient is currently admitted to the hospital and receiving intravenous antibiotics. The antibiotics were administered within an appropriate  time frame prior to the initiation of the procedure. ANESTHESIA/SEDATION: Moderate (conscious) sedation was employed during this procedure. A total of Versed 2 mg was administered intravenously. Moderate Sedation Time: 31 minutes. The patient's level of  consciousness and vital signs were monitored continuously by radiology nursing throughout the procedure under my direct supervision. CONTRAST:  None COMPLICATIONS: None immediate. PROCEDURE: Informed written consent was obtained from the patient after a discussion of the risks, benefits and alternatives to treatment. The patient was placed supine on the CT gantry and a pre procedural CT was performed re-demonstrating the known abscess/fluid collection within the ventral aspect of the left mid hemiabdomen with dominant component measuring approximately 3.5 x 1.9 cm (image 50, series 2) and additional complex mixed air and fluid pericolonic collection with dominant air containing collection adjacent to the mid descending colon measuring approximately 2.0 x 1.8 cm (image 55, series 2). The procedures were planned. A timeout was performed prior to the initiation of the procedures. The skin overlying the lateral aspect the left mid hemiabdomen was prepped and draped in the usual sterile fashion. The overlying soft tissues were anesthetized with 1% lidocaine with epinephrine. Appropriate trajectory was planned with the use of a 22 gauge spinal needles. Next, a 18 gauge trocar needles were placed and short Amplatz wires were coiled within each collection. Appropriate positioning was confirmed with a limited CT scan. Both tracks were serially dilated allowing placement of a 10 [place] all-purpose drainage catheters, however note there is some difficulty coiling the drainage catheter within the more ventral collection ultimately requiring drainage catheter reposition. Appropriate positioning was confirmed with a limited postprocedural CT scan. A total of approximately 10 cc of purulent fluid was aspirated from both collections. The both drainage catheters were connected to gravity bags and sutured in place. Dressings were applied. The patient tolerated the procedure well without immediate post procedural complication.  IMPRESSION: 1. Successful CT guided placement of a 10 French all purpose drain catheter into the abscess within the ventral aspect of the left mid hemiabdomen. 2. Successful CT-guided placement of a 10 French all-purpose drainage catheter into the dominant ill-defined complex air and fluid containing collection adjacent to the mid descending colon. 3. A total of approximately 10 cc purulent blood tinged fluid was aspirated both drainage catheters. A representative sample aspirated fluid was capped and sent to the laboratory for analysis. Electronically Signed   By: Sandi Mariscal M.D.   On: 04/09/2020 16:49   CT IMAGE GUIDED DRAINAGE BY PERCUTANEOUS CATHETER  Result Date: 04/09/2020 INDICATION: Recent diagnosis of diverticulitis, now with an abscess with ventral aspect of the left mid hemiabdomen as well as complex air and fluid collection adjacent to mid descending colon. Request made for CT-guided aspiration(s) / drainage catheter(s) placement for infection source control purposes. EXAM: CT IMAGE GUIDED DRAINAGE BY PERCUTANEOUS CATHETER x2 COMPARISON:  CT abdomen pelvis-04/08/2020; 04/03/2020 MEDICATIONS: The patient is currently admitted to the hospital and receiving intravenous antibiotics. The antibiotics were administered within an appropriate time frame prior to the initiation of the procedure. ANESTHESIA/SEDATION: Moderate (conscious) sedation was employed during this procedure. A total of Versed 2 mg was administered intravenously. Moderate Sedation Time: 31 minutes. The patient's level of consciousness and vital signs were monitored continuously by radiology nursing throughout the procedure under my direct supervision. CONTRAST:  None COMPLICATIONS: None immediate. PROCEDURE: Informed written consent was obtained from the patient after a discussion of the risks, benefits and alternatives to treatment. The patient was placed supine on the CT gantry  and a pre procedural CT was performed re-demonstrating the  known abscess/fluid collection within the ventral aspect of the left mid hemiabdomen with dominant component measuring approximately 3.5 x 1.9 cm (image 50, series 2) and additional complex mixed air and fluid pericolonic collection with dominant air containing collection adjacent to the mid descending colon measuring approximately 2.0 x 1.8 cm (image 55, series 2). The procedures were planned. A timeout was performed prior to the initiation of the procedures. The skin overlying the lateral aspect the left mid hemiabdomen was prepped and draped in the usual sterile fashion. The overlying soft tissues were anesthetized with 1% lidocaine with epinephrine. Appropriate trajectory was planned with the use of a 22 gauge spinal needles. Next, a 18 gauge trocar needles were placed and short Amplatz wires were coiled within each collection. Appropriate positioning was confirmed with a limited CT scan. Both tracks were serially dilated allowing placement of a 10 [place] all-purpose drainage catheters, however note there is some difficulty coiling the drainage catheter within the more ventral collection ultimately requiring drainage catheter reposition. Appropriate positioning was confirmed with a limited postprocedural CT scan. A total of approximately 10 cc of purulent fluid was aspirated from both collections. The both drainage catheters were connected to gravity bags and sutured in place. Dressings were applied. The patient tolerated the procedure well without immediate post procedural complication. IMPRESSION: 1. Successful CT guided placement of a 10 French all purpose drain catheter into the abscess within the ventral aspect of the left mid hemiabdomen. 2. Successful CT-guided placement of a 10 French all-purpose drainage catheter into the dominant ill-defined complex air and fluid containing collection adjacent to the mid descending colon. 3. A total of approximately 10 cc purulent blood tinged fluid was aspirated both  drainage catheters. A representative sample aspirated fluid was capped and sent to the laboratory for analysis. Electronically Signed   By: Sandi Mariscal M.D.   On: 04/09/2020 16:49    Labs:  CBC: Recent Labs    04/07/20 0303 04/08/20 0252 04/09/20 0029 04/11/20 0429  WBC 7.4 6.9 12.5* 6.7  HGB 10.3* 11.0* 11.3* 10.0*  HCT 33.6* 35.4* 36.4 33.5*  PLT 190 221 225 231    COAGS: Recent Labs    04/09/20 0921  INR 1.1    BMP: Recent Labs    04/03/20 0600 04/03/20 0600 04/05/20 0419 04/06/20 0251 04/07/20 0303 04/08/20 0252 04/10/20 0535  NA 138  --   --  137 139 141  --   K 3.7   < > 4.0 4.3 4.2 4.6  --   CL 111  --   --  107 109 107  --   CO2 20*  --   --  25 25 26   --   GLUCOSE 73  --   --  116* 103* 103*  --   BUN 23*  --   --  <5* 6 5*  --   CALCIUM 7.7*  --   --  8.2* 8.2* 8.4*  --   CREATININE 0.60   < >  --  0.61 0.54 0.73 0.60  GFRNONAA >60   < >  --  >60 >60 >60 >60  GFRAA >60   < >  --  >60 >60 >60 >60   < > = values in this interval not displayed.    LIVER FUNCTION TESTS: Recent Labs    09/06/19 1325 09/07/19 0614 04/03/20 0600  BILITOT 1.4* 1.2 0.4  AST 14* 10* 16  ALT 13 11  16  ALKPHOS 107 75 84  PROT 7.6 5.8* 6.3*  ALBUMIN 4.3 3.3* 3.6    Assessment and Plan: Patient with history of diverticulitis with associated abscesses, status post left abdominal drain placement x2 on 04/09/2020; afebrile, BP a little soft, last WBC yesterday normal, hemoglobin 10, back to normal, drain fluid cultures with multiple organisms; patient scheduled for discharge home today with drains in place; will schedule for follow-up CT/drain evaluation in IR clinic in 7 to 10 days.  Patient instructed to flush drains once daily with 5 cc sterile normal saline, record output and change dressings as needed.   Electronically Signed: D. Rowe Robert, PA-C 04/12/2020, 1:01 PM   I spent a total of 15 minutes at the the patient's bedside AND on the patient's hospital floor or  unit, greater than 50% of which was counseling/coordinating care for left abdominal abscess drains    Patient ID: [name], female   DOB: December 01, 1966, 54 y.o.   MRN: [id number]

## 2020-04-12 NOTE — Discharge Instructions (Signed)
DRAIN CARE:   You have a closed bulb drain to help you heal.    A bulb drain is a small, plastic reservoir which creates a gentle suction. It is used to remove excess fluid from a surgical wound. The color and amount of fluid will vary. Immediately after surgery, the fluid is bright red. It may gradually change to a yellow color. When the amount decreases to about 1 or 2 tablespoons (15 to 30 cc) per 24 hours, your caregiver will usually remove it.  JP Care  The Jackson-Pratt drainage system has flexible tubing attached to a soft, plastic bulb with a stopper. The drainage end of the tubing, which is flat and white, goes into your body through a small opening near your incision (surgical cut). A stitch holds the drainage end in place. The rest of the tube is outside your body, attached to the bulb. When the bulb is compressed with the stopper in place, it creates a vacuum. This causes a constant gentle suction, which helps draw out fluid that collects under your incision. The bulb should be compressed at all times, except when you are emptying the drainage.  How long you will have your Jackson-Pratt depends on your surgery and the amount of fluid is draining. This is different for everyone. The Jackson-Pratt is usually removed when the drainage is 30 mL or less over 24 hours. To keep track of how much drainage youre having, you will record the amount in a drainage log. Its important to bring the log with you to your follow-up appointments.  Caring for Your Jackson-Pratt at Home In order to care for your Jackson-Pratt at home, you or your caregiver will do the following:  Empty the drain once a day and record the color and amount of drainage  Care for the area where the tubing enters your skin by washing with soap and water.  Milk the tubing to help move clots into the bulb.  Do this before you empty and measure your drainage. Look in the mirror at the tubing. This will help you see where your  hands need to be. Pinch the tubing close to where it goes into your skin between your thumb and forefinger. With the thumb and forefinger of your other hand, pinch the tubing right below your other fingers. Keep your fingers pinched and slide them down the tubing, pushing any clots down toward the bulb. You may want to use alcohol swabs to help you slide your fingers down the tubing. Repeat steps 3 and 4 as necessary to push clots from the tubing into the bulb. If you are not able to move a clot into the bulb, call your doctors office. The fluid may leak around the insertion site if a clot is blocking the drainage flow. If there is fluid in the bulb and no leakage at the insertion site, the drain is working.  How to Empty Your Jackson-Pratt and Record the Drainage You will need to empty your Jackson-Pratt every day  Gather the following supplies:  Measuring container your nurse gave you Jackson-Pratt Drainage Record  Pen or pencil  Instructions Clean an area to work on. Clean your hands thoroughly. Unplug the stopper on top of your Jackson-Pratt. This will cause the bulb to expand. Do not touch the inside of the stopper or the inner area of the opening on the bulb. Turn your Jackson-Pratt upside down, gently squeeze the bulb, and pour the drainage into the measuring container. Turn your Jackson-Pratt right  side up. Squeeze the bulb until your fingers feel the palm of your hand. Keep squeezing the bulb while you replug the stopper. Make sure the bulb stays fully compressed to ensure constant, gentle suction.    Check the amount and color of drainage in the measuring container. The first couple days after surgery the fluid may be dark red. This is normal. As you heal the fluid may look pink or pale yellow. Record this amount and the color of drainage on your Jackson-Pratt Drainage Record. Flush the drainage down the toilet and rinse the measuring container with water.  Caring for the  Insertion Site  Once you have emptied the drainage, clean your hands again. Check the area around the insertion site. Look for tenderness, swelling, or pus. If you have any of these, or if you have a temperature of 101 F (38.3 C) or higher, you may have an infection. Call your doctors office.  Sometimes, the drain causes redness the size of a dime at your insertion site. This is normal. Your healthcare provider will tell you if you should place a bandage over the insertion site.  Wash drain site with soap & water (dilute hydrogen peroxide PRN) daily & replace clean dressing / tape    DAILY CARE  Keep the bulb compressed at all times, except while emptying it. The compression creates suction.   Keep sites where the tubes enter the skin dry and covered with a light bandage (dressing).   Tape the tubes to your skin, 1 to 2 inches below the insertion sites, to keep from pulling on your stitches. Tubes are stitched in place and will not slip out.   Pin the bulb to your shirt (not to your pants) with a safety pin.   For the first few days after surgery, there usually is more fluid in the bulb. Empty the bulb whenever it becomes half full because the bulb does not create enough suction if it is too full. Include this amount in your 24 hour totals.   When the amount of drainage decreases, empty the bulb at the same time every day. Write down the amounts and the 24 hour totals. Your caregiver will want to know them. This helps your caregiver know when the tubes can be removed.   (We anticipate removing the drain in 1-3 weeks, depending on when the output is <12m a day for 2+ days)  If there is drainage around the tube sites, change dressings and keep the area dry. If you see a clot in the tube, leave it alone. However, if the tube does not appear to be draining, let your caregiver know.  TO EMPTY THE BULB  Open the stopper to release suction.   Holding the stopper out of the way, pour  drainage into the measuring cup that was sent home with you.   Measure and write down the amount. If there are 2 bulbs, note the amount of drainage from bulb 1 or bulb 2 and keep the totals separate. Your caregiver will want to know which tube is draining more.   Compress the bulb by folding it in half.   Replace the stopper.   Check the tape that holds the tube to your skin, and pin the bulb to your shirt.  SEEK MEDICAL CARE IF:  The drainage develops a bad odor.   You have an oral temperature above 102 F (38.9 C).   The amount of drainage from your wound suddenly increases or decreases.  You accidentally pull out your drain.   You have any other questions or concerns.  MAKE SURE YOU:   Understand these instructions.   Will watch your condition.   Will get help right away if you are not doing well or get worse.     Call our office if you have any questions about your drain. [phone]      Diverticulitis  Diverticulitis is infection or inflammation of small pouches (diverticula) in the colon that form due to a condition called diverticulosis. Diverticula can trap stool (feces) and bacteria, causing infection and inflammation. Diverticulitis may cause severe stomach pain and diarrhea. It may lead to tissue damage in the colon that causes bleeding. The diverticula may also burst (rupture) and cause infected stool to enter other areas of the abdomen. Complications of diverticulitis can include:  Bleeding.  Severe infection.  Severe pain.  Rupture (perforation) of the colon.  Blockage (obstruction) of the colon. What are the causes? This condition is caused by stool becoming trapped in the diverticula, which allows bacteria to grow in the diverticula. This leads to inflammation and infection. What increases the risk? You are more likely to develop this condition if:  You have diverticulosis. The risk for diverticulosis increases if: ? You are overweight or  obese. ? You use tobacco products. ? You do not get enough exercise.  You eat a diet that does not include enough fiber. High-fiber foods include fruits, vegetables, beans, nuts, and whole grains. What are the signs or symptoms? Symptoms of this condition may include:  Pain and tenderness in the abdomen. The pain is normally located on the left side of the abdomen, but it may occur in other areas.  Fever and chills.  Bloating.  Cramping.  Nausea.  Vomiting.  Changes in bowel routines.  Blood in your stool. How is this diagnosed? This condition is diagnosed based on:  Your medical history.  A physical exam.  Tests to make sure there is nothing else causing your condition. These tests may include: ? Blood tests. ? Urine tests. ? Imaging tests of the abdomen, including X-rays, ultrasounds, MRIs, or CT scans. How is this treated? Most cases of this condition are mild and can be treated at home. Treatment may include:  Taking over-the-counter pain medicines.  Following a clear liquid diet.  Taking antibiotic medicines by mouth.  Rest. More severe cases may need to be treated at a hospital. Treatment may include:  Not eating or drinking.  Taking prescription pain medicine.  Receiving antibiotic medicines through an IV tube.  Receiving fluids and nutrition through an IV tube.  Surgery. When your condition is under control, your health care provider may recommend that you have a colonoscopy. This is an exam to look at the entire large intestine. During the exam, a lubricated, bendable tube is inserted into the anus and then passed into the rectum, colon, and other parts of the large intestine. A colonoscopy can show how severe your diverticula are and whether something else may be causing your symptoms. Follow these instructions at home: Medicines  Take over-the-counter and prescription medicines only as told by your health care provider. These include fiber  supplements, probiotics, and stool softeners.  If you were prescribed an antibiotic medicine, take it as told by your health care provider. Do not stop taking the antibiotic even if you start to feel better.  Do not drive or use heavy machinery while taking prescription pain medicine. General instructions   Follow a full   liquid diet or another diet as directed by your health care provider. After your symptoms improve, your health care provider may tell you to change your diet. He or she may recommend that you eat a diet that contains at least 25 g (25 grams) of fiber daily. Fiber makes it easier to pass stool. Healthy sources of fiber include: ? Berries. One cup contains 4-8 grams of fiber. ? Beans or lentils. One half cup contains 5-8 grams of fiber. ? Green vegetables. One cup contains 4 grams of fiber.  Exercise for at least 30 minutes, 3 times each week. You should exercise hard enough to raise your heart rate and break a sweat.  Keep all follow-up visits as told by your health care provider. This is important. You may need a colonoscopy. Contact a health care provider if:  Your pain does not improve.  You have a hard time drinking or eating food.  Your bowel movements do not return to normal. Get help right away if:  Your pain gets worse.  Your symptoms do not get better with treatment.  Your symptoms suddenly get worse.  You have a fever.  You vomit more than one time.  You have stools that are bloody, black, or tarry. Summary  Diverticulitis is infection or inflammation of small pouches (diverticula) in the colon that form due to a condition called diverticulosis. Diverticula can trap stool (feces) and bacteria, causing infection and inflammation.  You are at higher risk for this condition if you have diverticulosis and you eat a diet that does not include enough fiber.  Most cases of this condition are mild and can be treated at home. More severe cases may need to be  treated at a hospital.  When your condition is under control, your health care provider may recommend that you have an exam called a colonoscopy. This exam can show how severe your diverticula are and whether something else may be causing your symptoms. This information is not intended to replace advice given to you by your health care provider. Make sure you discuss any questions you have with your health care provider. Document Revised: 11/02/2017 Document Reviewed: 12/23/2016 Elsevier Patient Education  2020 [employer]  

## 2020-04-21 ENCOUNTER — Ambulatory Visit
Admission: RE | Admit: 2020-04-21 | Discharge: 2020-04-21 | Disposition: A | Discharge status: Home / Self Care | Payer: Commercial Managed Care - PPO | Source: Ambulatory Visit | Attending: Surgery | Admitting: Surgery

## 2020-04-21 ENCOUNTER — Ambulatory Visit
Admission: RE | Admit: 2020-04-21 | Discharge: 2020-04-21 | Disposition: A | Discharge status: Home / Self Care | Payer: Commercial Managed Care - PPO | Source: Ambulatory Visit | Attending: Radiology | Admitting: Radiology

## 2020-04-21 ENCOUNTER — Encounter: Payer: Self-pay | Admitting: Radiology

## 2020-04-21 ENCOUNTER — Other Ambulatory Visit: Payer: Self-pay | Admitting: Surgery

## 2020-04-21 DIAGNOSIS — K5720 Diverticulitis of large intestine with perforation and abscess without bleeding: Secondary | ICD-10-CM

## 2020-04-21 DIAGNOSIS — L0291 Cutaneous abscess, unspecified: Secondary | ICD-10-CM

## 2020-04-21 DIAGNOSIS — K651 Peritoneal abscess: Secondary | ICD-10-CM

## 2020-04-21 HISTORY — PX: IR RADIOLOGIST EVAL & MGMT: IMG5224

## 2020-04-21 MED ORDER — IOPAMIDOL (ISOVUE-300) INJECTION 61%
100.0000 mL | Freq: Once | INTRAVENOUS | Status: AC | PRN
  Administered 2020-04-21: 100 mL via INTRAVENOUS

## 2020-04-21 NOTE — Progress Notes (Signed)
Referring Physician(s): Allred,Darrell K  Chief Complaint: The patient is seen in follow up today s/p perforated diverticulitis s/p drain placement x2 04/09/20  History of present illness: [name] is a 54 y.o. female with past medical history of ADHD, prior tobacco abuse, alcohol abuse, anxiety/depression, GERD, hypertension who was admitted to [facility] on 5/1 with left sided abdominal pain and diarrhea.  She  had previous admission in 2020 for diverticulitis which was treated with antibiotics alone.  She was on IV antibiotics x6 days, however subsequent imaging showed interval development of intra-abdominal fluid collections. She underwent drainage x2 in the LLQ 04/09/20 by Dr. Pascal Lux.  She was ultimately discharged home with drains in place.  She presents to IR drain clinic today for evaluation of her drains.   [name] presents today reporting improvement in her symptoms.  She has been flushing her drains daily.  Output has been minimal from her superior drain.  Inferior drain has small amount of beige-appearing fluid.  She has been eating and drinking per her usual.  Reports next surgical visit is scheduled for 05/05/20.  Past Medical History:  Diagnosis Date  . ADHD (attention deficit hyperactivity disorder)   . Alcohol dependence with acute alcoholic intoxication with complication ([place]) 0000000  . Anemia   . Anxiety   . Cancer ([place])    bladder  . Cutaneous lupus erythematosus   . 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 GERD (gastroesophageal reflux disease)   . History of anemia   . Hypertension   . Severe episode of recurrent major depressive disorder, without psychotic features ([place]) 01/07/2020  . SUI (stress urinary incontinence, female)     Past Surgical History:  Procedure Laterality Date  . BLADDER SUSPENSION N/A 02/13/2013   Procedure: [facility];  Surgeon: Malka So, MD;  [facility];  Service: Urology;   Laterality: N/A;  . CESAREAN SECTION  12-23-2004    W/ LEFT TUBAL LIGATION AND RIGHT SALPINGECTOMY  . CESAREAN SECTION  1995  . ORIF HUMERUS FRACTURE Right 04/28/2014   Procedure: OPEN REDUCTION INTERNAL FIXATION (ORIF) PROXIMAL HUMERUS FRACTURE;  Surgeon: Nita Sells, MD;  Location: [place];  Service: Orthopedics;  Laterality: Right;  . ORIF PROXIMAL HUMERUS FRACTURE Right 04/28/2014   DR CHANDLER  . TUBAL LIGATION      Allergies: Codeine, Hydrocodone-acetaminophen, Vicodin [hydrocodone-acetaminophen], and Hydrocodone  Medications: Prior to Admission medications   Medication Sig Start Date End Date Taking? Authorizing Provider  acetaminophen (TYLENOL) 325 MG tablet Take 2 tablets (650 mg total) by mouth every 6 (six) hours as needed for mild pain (or temp > 100). 04/12/20   Meuth, Brooke A, PA-C  Acetylcysteine (N-ACETYL-L-CYSTEINE) 600 MG CAPS Take 600 mg by mouth in the morning, at noon, and at bedtime.    [provider]  ADZENYS XR-ODT 12.5 MG TBED Take 25 mg by mouth daily.  11/28/18   [provider]  BREZTRI AEROSPHERE 160-9-4.8 MCG/ACT AERO Inhale 1 puff into the lungs in the morning and at bedtime. 03/24/20   [provider]  budesonide (PULMICORT) 0.5 MG/2ML nebulizer solution Take 2 mLs by nebulization daily. 03/17/20   [provider]  cloNIDine (CATAPRES) 0.1 MG tablet Take 0.1 mg by mouth 4 (four) times daily.    [provider]  FLUoxetine HCl 60 MG TABS Take 60 mg by mouth daily. 08/25/19   [provider]  fluticasone (FLONASE) 50 MCG/ACT nasal spray Place 2 sprays into  the nose daily as needed for allergies or rhinitis.  01/28/19   [provider]  gabapentin (NEURONTIN) 300 MG capsule Take 300-900 mg by mouth See admin instructions. Take 1 capsule (300mg ) by mouth three times daily and 3 capsules (900mg ) by mouth at bedtime.    [provider]  hydrOXYzine (VISTARIL) 25 MG capsule Take 25 mg by  mouth 2 (two) times daily as needed for anxiety. 08/07/19   [provider]  ipratropium-albuterol (DUONEB) 0.5-2.5 (3) MG/3ML SOLN Take 3 mLs by nebulization every 6 (six) hours as needed. Patient taking differently: Take 3 mLs by nebulization every 6 (six) hours as needed (shortness of breath/wheezing).  09/10/19   Tylene Fantasia, PA-C  Multiple Vitamin (MULTIVITAMIN WITH MINERALS) TABS tablet Take 1 tablet by mouth daily. 04/13/20   Meuth, Brooke A, PA-C  oxyCODONE (OXY IR/ROXICODONE) 5 MG immediate release tablet Take 1 tablet (5 mg total) by mouth every 6 (six) hours as needed for severe pain. 04/12/20   Meuth, Brooke A, PA-C  PROAIR HFA 108 (90 Base) MCG/ACT inhaler Inhale 2 puffs into the lungs every 4 (four) hours as needed for wheezing or shortness of breath.  11/24/18   [provider]  progesterone (PROMETRIUM) 100 MG capsule Take 200 mg by mouth daily.  08/25/19   [provider]  sodium chloride flush (NS) 0.9 % SOLN 5 mLs by Intracatheter route every 12 (twelve) hours. Flush each drain with 5cc normal saline twice daily 04/12/20   Meuth, Brooke A, PA-C  SUMAtriptan (IMITREX) 100 MG tablet Take 100 mg by mouth daily as needed for migraine. 03/09/20   [provider]  topiramate (TOPAMAX) 50 MG tablet Take 50 mg by mouth daily. 03/08/20   [provider]  traZODone (DESYREL) 100 MG tablet Take 100 mg by mouth at bedtime.    [provider]  Turmeric 500 MG CAPS Take 500 mg by mouth daily.    [provider]  Vitamin D, Ergocalciferol, (DRISDOL) 1.25 MG (50000 UNIT) CAPS capsule Take 50,000 Units by mouth once a week. 03/18/20   [provider]     Family History  Adopted: Yes    Social History   Socioeconomic History  . Marital status: Married    Spouse name: Not on file  . Number of children: Not on file  . Years of education: Not on file  . Highest education level: Not on file  Occupational History  . Not on file   Tobacco Use  . Smoking status: Former Smoker    Packs/day: 0.25    Years: 20.00    Pack years: 5.00    Types: Cigarettes    Quit date: 09/24/2019    Years since quitting: 0.5  . Smokeless tobacco: Never Used  Substance and Sexual Activity  . Alcohol use: Yes    Comment: was in recovery for 4 months.   . Drug use: No  . Sexual activity: Not Currently    Birth control/protection: Post-menopausal  Other Topics Concern  . Not on file  Social History Narrative  . Not on file   Social Determinants of Health   Financial Resource Strain:   . Difficulty of Paying Living Expenses:   Food Insecurity:   . Worried About Charity fundraiser in the Last Year:   . Arboriculturist in the Last Year:   Transportation Needs:   . Film/video editor (Medical):   Marland Kitchen Lack of Transportation (Non-Medical):   Physical Activity:   .  Days of Exercise per Week:   . Minutes of Exercise per Session:   Stress:   . Feeling of Stress :   Social Connections:   . Frequency of Communication with Friends and Family:   . Frequency of Social Gatherings with Friends and Family:   . Attends Religious Services:   . Active Member of Clubs or Organizations:   . Attends Archivist Meetings:   Marland Kitchen Marital Status:      Vital Signs: There were no vitals taken for this visit.  Physical Exam Vitals and nursing note reviewed.   NAD, alert Abdomen: soft, non-tender.  Both LLQ drains in place.  Insertion sites I/c/d. Output from superior drain is minimal, serous.  Output from inferior/posterior drain is beige with foul odor.   Imaging: No results found.  Labs:  CBC: Recent Labs    04/07/20 0303 04/08/20 0252 04/09/20 0029 04/11/20 0429  WBC 7.4 6.9 12.5* 6.7  HGB 10.3* 11.0* 11.3* 10.0*  HCT 33.6* 35.4* 36.4 33.5*  PLT 190 221 225 231    COAGS: Recent Labs    04/09/20 0921  INR 1.1    BMP: Recent Labs    04/03/20 0600 04/03/20 0600 04/05/20 0419 04/06/20 0251 04/07/20 0303  04/08/20 0252 04/10/20 0535  NA 138  --   --  137 139 141  --   K 3.7   < > 4.0 4.3 4.2 4.6  --   CL 111  --   --  107 109 107  --   CO2 20*  --   --  25 25 26   --   GLUCOSE 73  --   --  116* 103* 103*  --   BUN 23*  --   --  <5* 6 5*  --   CALCIUM 7.7*  --   --  8.2* 8.2* 8.4*  --   CREATININE 0.60   < >  --  0.61 0.54 0.73 0.60  GFRNONAA >60   < >  --  >60 >60 >60 >60  GFRAA >60   < >  --  >60 >60 >60 >60   < > = values in this interval not displayed.    LIVER FUNCTION TESTS: Recent Labs    09/06/19 1325 09/07/19 0614 04/03/20 0600  BILITOT 1.4* 1.2 0.4  AST 14* 10* 16  ALT 13 11 16   ALKPHOS 107 75 84  PROT 7.6 5.8* 6.3*  ALBUMIN 4.3 3.3* 3.6    Assessment: Perforated diverticulitis with intra-abdominal fluid collections s/p drainage x2 04/09/20  Patient presents for follow-up with IR drain clinic today.  Overall, she reports improvement in her symptoms.  She has completed abx therapy.  She has been able to advance her diet with tolerance.  She reports minimal drainage from her anterior drain, however her posterior drain has been draining a small amount of beige, foul-smelling fluid.  CT imaging and drain injection performed today and reviewed by Dr. Vernard Gambles.  Anterior, abdominal wall fluid collection has resolved.  This drain is removed in its entirety without complication.  The posterior drain has formed a fistulous connection and will remain in place today. Plan to see in follow-up for after her 05/05/20 visit with surgery.  Drain reconnected to gravity drainage.  Patient instructed not to flush.  Continue to drain and record output daily.   Signed: Docia Barrier, PA 04/21/2020, 12:58 PM   Please refer to Dr. Vernard Gambles attestation of this note for management and plan.

## 2020-04-26 ENCOUNTER — Ambulatory Visit (HOSPITAL_COMMUNITY): Payer: Self-pay | Admitting: Surgery

## 2020-04-27 ENCOUNTER — Other Ambulatory Visit: Payer: Self-pay

## 2020-04-27 ENCOUNTER — Inpatient Hospital Stay (HOSPITAL_COMMUNITY)
Admission: EM | Admit: 2020-04-27 | Discharge: 2020-05-01 | DRG: 392 | Disposition: A | Discharge status: Home / Self Care | Payer: Commercial Managed Care - PPO | Attending: Surgery | Admitting: Surgery

## 2020-04-27 ENCOUNTER — Encounter (HOSPITAL_COMMUNITY): Payer: Self-pay

## 2020-04-27 ENCOUNTER — Emergency Department (HOSPITAL_COMMUNITY): Payer: Commercial Managed Care - PPO

## 2020-04-27 DIAGNOSIS — Z79899 Other long term (current) drug therapy: Secondary | ICD-10-CM

## 2020-04-27 DIAGNOSIS — Z79890 Hormone replacement therapy (postmenopausal): Secondary | ICD-10-CM | POA: Diagnosis not present

## 2020-04-27 DIAGNOSIS — Z20822 Contact with and (suspected) exposure to covid-19: Secondary | ICD-10-CM | POA: Diagnosis present

## 2020-04-27 DIAGNOSIS — K5792 Diverticulitis of intestine, part unspecified, without perforation or abscess without bleeding: Secondary | ICD-10-CM | POA: Diagnosis present

## 2020-04-27 DIAGNOSIS — R911 Solitary pulmonary nodule: Secondary | ICD-10-CM | POA: Diagnosis present

## 2020-04-27 DIAGNOSIS — K219 Gastro-esophageal reflux disease without esophagitis: Secondary | ICD-10-CM | POA: Diagnosis present

## 2020-04-27 DIAGNOSIS — F329 Major depressive disorder, single episode, unspecified: Secondary | ICD-10-CM | POA: Diagnosis present

## 2020-04-27 DIAGNOSIS — I10 Essential (primary) hypertension: Secondary | ICD-10-CM | POA: Diagnosis present

## 2020-04-27 DIAGNOSIS — Z7951 Long term (current) use of inhaled steroids: Secondary | ICD-10-CM | POA: Diagnosis not present

## 2020-04-27 DIAGNOSIS — F411 Generalized anxiety disorder: Secondary | ICD-10-CM | POA: Diagnosis present

## 2020-04-27 DIAGNOSIS — Z87891 Personal history of nicotine dependence: Secondary | ICD-10-CM

## 2020-04-27 DIAGNOSIS — K5780 Diverticulitis of intestine, part unspecified, with perforation and abscess without bleeding: Principal | ICD-10-CM | POA: Diagnosis present

## 2020-04-27 DIAGNOSIS — F419 Anxiety disorder, unspecified: Secondary | ICD-10-CM | POA: Diagnosis present

## 2020-04-27 DIAGNOSIS — I959 Hypotension, unspecified: Secondary | ICD-10-CM | POA: Diagnosis not present

## 2020-04-27 LAB — URINALYSIS, ROUTINE W REFLEX MICROSCOPIC
Bilirubin Urine: NEGATIVE
Glucose, UA: NEGATIVE mg/dL
Hgb urine dipstick: NEGATIVE
Ketones, ur: NEGATIVE mg/dL
Nitrite: NEGATIVE
Protein, ur: NEGATIVE mg/dL
Specific Gravity, Urine: 1.03 (ref 1.005–1.030)
pH: 6 (ref 5.0–8.0)

## 2020-04-27 LAB — COMPREHENSIVE METABOLIC PANEL
ALT: 11 U/L (ref 0–44)
AST: 13 U/L — ABNORMAL LOW (ref 15–41)
Albumin: 3.8 g/dL (ref 3.5–5.0)
Alkaline Phosphatase: 84 U/L (ref 38–126)
Anion gap: 7 (ref 5–15)
BUN: 13 mg/dL (ref 6–20)
CO2: 22 mmol/L (ref 22–32)
Calcium: 8.5 mg/dL — ABNORMAL LOW (ref 8.9–10.3)
Chloride: 109 mmol/L (ref 98–111)
Creatinine, Ser: 0.73 mg/dL (ref 0.44–1.00)
GFR calc Af Amer: >60 mL/min (ref >60)
GFR calc non Af Amer: >60 mL/min (ref >60)
Glucose, Bld: 87 mg/dL (ref 70–99)
Potassium: 4 mmol/L (ref 3.5–5.1)
Sodium: 138 mmol/L (ref 135–145)
Total Bilirubin: 0.6 mg/dL (ref 0.3–1.2)
Total Protein: 7 g/dL (ref 6.5–8.1)

## 2020-04-27 LAB — CBC
HCT: 40.6 % (ref 36.0–46.0)
HCT: 40.7 % (ref 36.0–46.0)
Hemoglobin: 12.6 g/dL (ref 12.0–15.0)
Hemoglobin: 12.6 g/dL (ref 12.0–15.0)
MCH: 28.5 pg (ref 26.0–34.0)
MCH: 28.8 pg (ref 26.0–34.0)
MCHC: 31 g/dL (ref 30.0–36.0)
MCHC: 31 g/dL (ref 30.0–36.0)
MCV: 91.9 fL (ref 80.0–100.0)
MCV: 92.9 fL (ref 80.0–100.0)
Platelets: 281 10*3/uL (ref 150–400)
Platelets: 251 10*3/uL (ref 150–400)
RBC: 4.42 MIL/uL (ref 3.87–5.11)
RBC: 4.38 MIL/uL (ref 3.87–5.11)
RDW: 13.8 % (ref 11.5–15.5)
RDW: 13.9 % (ref 11.5–15.5)
WBC: 10.6 10*3/uL — ABNORMAL HIGH (ref 4.0–10.5)
WBC: 11.9 10*3/uL — ABNORMAL HIGH (ref 4.0–10.5)
nRBC: 0 % (ref 0.0–0.2)
nRBC: 0 % (ref 0.0–0.2)

## 2020-04-27 LAB — LIPASE, BLOOD: Lipase: 36 U/L (ref 11–51)

## 2020-04-27 LAB — CREATININE, SERUM
Creatinine, Ser: 0.49 mg/dL (ref 0.44–1.00)
GFR calc Af Amer: >60 mL/min (ref >60)
GFR calc non Af Amer: >60 mL/min (ref >60)

## 2020-04-27 LAB — SARS CORONAVIRUS 2 BY RT PCR (HOSPITAL ORDER, PERFORMED IN ~~LOC~~ HOSPITAL LAB): SARS Coronavirus 2: NEGATIVE

## 2020-04-27 MED ORDER — AMPHETAMINE ER 12.5 MG PO TBED: 25.0000 mg | EXTENDED_RELEASE_TABLET | Freq: Two times a day (BID) | ORAL | Status: DC

## 2020-04-27 MED ORDER — HYDROMORPHONE HCL 1 MG/ML IJ SOLN
0.5000 mg | INTRAMUSCULAR | Status: DC | PRN
  Administered 2020-04-27 – 2020-04-28 (×4): 1 mg via INTRAVENOUS
  Administered 2020-04-29: 0.5 mg via INTRAVENOUS
  Administered 2020-04-30 (×2): 1 mg via INTRAVENOUS
  Filled 2020-04-27 (×7): qty 1

## 2020-04-27 MED ORDER — KCL IN DEXTROSE-NACL 10-5-0.45 MEQ/L-%-% IV SOLN
INTRAVENOUS | Status: DC
  Filled 2020-04-27 (×5): qty 1000

## 2020-04-27 MED ORDER — METOPROLOL TARTRATE 5 MG/5ML IV SOLN: 5.0000 mg | Freq: Four times a day (QID) | INTRAVENOUS | Status: DC | PRN

## 2020-04-27 MED ORDER — MORPHINE SULFATE (PF) 4 MG/ML IV SOLN
4.0000 mg | Freq: Once | INTRAVENOUS | Status: AC
  Administered 2020-04-27 (×2): 2 mg via INTRAVENOUS
  Filled 2020-04-27: qty 1

## 2020-04-27 MED ORDER — POLYETHYLENE GLYCOL 3350 17 G PO PACK: 17.0000 g | PACK | Freq: Every day | ORAL | Status: DC | PRN

## 2020-04-27 MED ORDER — SODIUM CHLORIDE (PF) 0.9 % IJ SOLN
INTRAMUSCULAR | Status: AC
  Filled 2020-04-27: qty 50

## 2020-04-27 MED ORDER — BUDESONIDE 0.5 MG/2ML IN SUSP
2.0000 mL | Freq: Every day | RESPIRATORY_TRACT | Status: DC
  Administered 2020-04-27 – 2020-04-28 (×2): 0.5 mg via RESPIRATORY_TRACT
  Filled 2020-04-27 (×5): qty 2

## 2020-04-27 MED ORDER — HYDROXYZINE PAMOATE 25 MG PO CAPS: 25.0000 mg | ORAL_CAPSULE | Freq: Two times a day (BID) | ORAL | Status: DC | PRN

## 2020-04-27 MED ORDER — PIPERACILLIN-TAZOBACTAM 3.375 G IVPB
3.3750 g | Freq: Three times a day (TID) | INTRAVENOUS | Status: DC
  Administered 2020-04-27 – 2020-04-30 (×8): 3.375 g via INTRAVENOUS
  Filled 2020-04-27 (×8): qty 50

## 2020-04-27 MED ORDER — PROGESTERONE MICRONIZED 100 MG PO CAPS
200.0000 mg | ORAL_CAPSULE | Freq: Every day | ORAL | Status: DC
  Administered 2020-04-28 – 2020-05-01 (×4): 200 mg via ORAL
  Filled 2020-04-27 (×4): qty 2

## 2020-04-27 MED ORDER — TOPIRAMATE 25 MG PO TABS
50.0000 mg | ORAL_TABLET | Freq: Every day | ORAL | Status: DC
  Administered 2020-04-27 – 2020-05-01 (×5): 50 mg via ORAL
  Filled 2020-04-27 (×5): qty 2

## 2020-04-27 MED ORDER — GABAPENTIN 300 MG PO CAPS
300.0000 mg | ORAL_CAPSULE | Freq: Three times a day (TID) | ORAL | Status: DC
  Administered 2020-04-27 – 2020-05-01 (×11): 300 mg via ORAL
  Filled 2020-04-27 (×11): qty 1

## 2020-04-27 MED ORDER — BUDESON-GLYCOPYRROL-FORMOTEROL 160-9-4.8 MCG/ACT IN AERO
1.0000 | INHALATION_SPRAY | Freq: Two times a day (BID) | RESPIRATORY_TRACT | Status: DC
  Administered 2020-04-28 – 2020-04-30 (×5): 1 via RESPIRATORY_TRACT

## 2020-04-27 MED ORDER — HYDROXYZINE HCL 25 MG PO TABS: 25.0000 mg | ORAL_TABLET | Freq: Two times a day (BID) | ORAL | Status: DC | PRN

## 2020-04-27 MED ORDER — SODIUM CHLORIDE 0.9 % IV SOLN
INTRAVENOUS | Status: DC | PRN
  Administered 2020-04-27: 250 mL via INTRAVENOUS

## 2020-04-27 MED ORDER — ONDANSETRON 4 MG PO TBDP
4.0000 mg | ORAL_TABLET | Freq: Four times a day (QID) | ORAL | Status: DC | PRN
  Administered 2020-04-28: 4 mg via ORAL
  Filled 2020-04-27: qty 1

## 2020-04-27 MED ORDER — FLUTICASONE PROPIONATE 50 MCG/ACT NA SUSP
2.0000 | Freq: Every day | NASAL | Status: DC | PRN
  Filled 2020-04-27: qty 16

## 2020-04-27 MED ORDER — ONDANSETRON HCL 4 MG/2ML IJ SOLN
4.0000 mg | Freq: Four times a day (QID) | INTRAMUSCULAR | Status: DC | PRN
  Administered 2020-04-27 – 2020-04-30 (×4): 4 mg via INTRAVENOUS
  Filled 2020-04-27 (×4): qty 2

## 2020-04-27 MED ORDER — SODIUM CHLORIDE 0.9 % IV BOLUS
1000.0000 mL | Freq: Once | INTRAVENOUS | Status: AC
  Administered 2020-04-27: 1000 mL via INTRAVENOUS

## 2020-04-27 MED ORDER — OXYCODONE HCL 5 MG PO TABS
5.0000 mg | ORAL_TABLET | ORAL | Status: DC | PRN
  Administered 2020-04-27 – 2020-05-01 (×16): 10 mg via ORAL
  Filled 2020-04-27 (×16): qty 2

## 2020-04-27 MED ORDER — ONDANSETRON HCL 4 MG/2ML IJ SOLN
4.0000 mg | Freq: Once | INTRAMUSCULAR | Status: AC
  Administered 2020-04-27: 4 mg via INTRAVENOUS
  Filled 2020-04-27: qty 2

## 2020-04-27 MED ORDER — SUMATRIPTAN SUCCINATE 50 MG PO TABS
100.0000 mg | ORAL_TABLET | Freq: Every day | ORAL | Status: DC | PRN
  Administered 2020-04-28 – 2020-04-29 (×2): 100 mg via ORAL
  Filled 2020-04-27 (×4): qty 2

## 2020-04-27 MED ORDER — ENOXAPARIN SODIUM 40 MG/0.4ML ~~LOC~~ SOLN
40.0000 mg | SUBCUTANEOUS | Status: DC
  Administered 2020-04-27 – 2020-04-30 (×4): 40 mg via SUBCUTANEOUS
  Filled 2020-04-27 (×4): qty 0.4

## 2020-04-27 MED ORDER — ACETAMINOPHEN 325 MG PO TABS
650.0000 mg | ORAL_TABLET | Freq: Four times a day (QID) | ORAL | Status: DC | PRN
  Administered 2020-04-28 – 2020-04-30 (×3): 650 mg via ORAL
  Filled 2020-04-27 (×3): qty 2

## 2020-04-27 MED ORDER — FENTANYL CITRATE (PF) 100 MCG/2ML IJ SOLN
50.0000 ug | Freq: Once | INTRAMUSCULAR | Status: AC
  Administered 2020-04-27: 50 ug via INTRAVENOUS
  Filled 2020-04-27: qty 2

## 2020-04-27 MED ORDER — CLONIDINE HCL 0.1 MG PO TABS
0.1000 mg | ORAL_TABLET | Freq: Four times a day (QID) | ORAL | Status: DC
  Administered 2020-04-27 – 2020-04-28 (×2): 0.1 mg via ORAL
  Filled 2020-04-27 (×7): qty 1

## 2020-04-27 MED ORDER — IOHEXOL 300 MG/ML  SOLN
100.0000 mL | Freq: Once | INTRAMUSCULAR | Status: AC | PRN
  Administered 2020-04-27: 100 mL via INTRAVENOUS

## 2020-04-27 MED ORDER — FLUOXETINE HCL 20 MG PO CAPS
60.0000 mg | ORAL_CAPSULE | Freq: Every day | ORAL | Status: DC
  Administered 2020-04-28 – 2020-05-01 (×4): 60 mg via ORAL
  Filled 2020-04-27 (×4): qty 3

## 2020-04-27 MED ORDER — ALBUTEROL SULFATE (2.5 MG/3ML) 0.083% IN NEBU: 2.5000 mg | INHALATION_SOLUTION | RESPIRATORY_TRACT | Status: DC | PRN

## 2020-04-27 MED ORDER — BUDESONIDE 0.5 MG/2ML IN SUSP: 2.0000 mL | Freq: Every day | RESPIRATORY_TRACT | Status: DC

## 2020-04-27 MED ORDER — ACETAMINOPHEN 650 MG RE SUPP: 650.0000 mg | Freq: Four times a day (QID) | RECTAL | Status: DC | PRN

## 2020-04-27 MED ORDER — GABAPENTIN 300 MG PO CAPS
900.0000 mg | ORAL_CAPSULE | Freq: Every day | ORAL | Status: DC
  Administered 2020-04-27 – 2020-04-30 (×4): 900 mg via ORAL
  Filled 2020-04-27 (×4): qty 3

## 2020-04-27 MED ORDER — TRAZODONE HCL 100 MG PO TABS
100.0000 mg | ORAL_TABLET | Freq: Every evening | ORAL | Status: DC | PRN
  Administered 2020-04-27 – 2020-04-30 (×3): 100 mg via ORAL
  Filled 2020-04-27 (×3): qty 1

## 2020-04-27 NOTE — ED Notes (Signed)
Pt provided w/labeled urine specimen cup for collection. [employer]

## 2020-04-27 NOTE — ED Notes (Signed)
Patient ambulatory to restroom  ?

## 2020-04-27 NOTE — ED Provider Notes (Signed)
[facility] DEPT Provider Note   CSN: [id number] Arrival date & time: 04/27/20  0113     History Chief Complaint  Patient presents with  . Abdominal Pain    [name] is a 54 y.o. female.  HPI      [name] is a 54 y.o. female, with a history of diverticulitis, anemia, HTN, presenting to the ED with abdominal pain beginning Saturday, May 22. Pain is a LLQ discomfort, "feels like ovulation pain," currently 4/10, nonradiating. States the pain feels similar to pain she was experiencing when her diverticulitis was initially found earlier in the month.  Accompanied by nausea. When the patient presented to the ED on May 1 for similar pain, CT showed acute diverticulitis with small abscess.  There is also a ill-defined air and fluid containing collection noted adjacent to the mid descending colon.  Two drains were placed by IR. CT on follow-up with IR on May 19 demonstrated resolution in the ill-defined fluid collection and this drain was removed.  There was improvement in the diverticular abscess and this drain was kept in place.  Patient states her pain had improved after drain placement.  She was able to empty about 5 cc of drainage daily from the collection bag.  Around the time her pain recurred, she also noted increased drainage with thicker, stool colored, foul-smelling drainage into the collection bag.  She called the general surgery office yesterday and was prescribed Augmentin, which she started yesterday morning. Diarrhea starting yesterday evening.   Follow up appt with general surgery scheduled for next Wed, June 2.  Last food was last night. Denies fever/chills, vomiting, hematochezia/melena, urinary symptoms, chest pain, shortness of breath, or any other complaints.   Past Medical History:  Diagnosis Date  . ADHD (attention deficit hyperactivity disorder)   . Alcohol dependence with acute alcoholic intoxication with complication  ([place]) 0000000  . Anemia   . Anxiety   . Cancer ([place])    bladder  . Cutaneous lupus erythematosus   . 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 GERD (gastroesophageal reflux disease)   . History of anemia   . Hypertension   . Severe episode of recurrent major depressive disorder, without psychotic features ([place]) 01/07/2020  . SUI (stress urinary incontinence, female)     Patient Active Problem List   Diagnosis Date Noted  . Diverticulitis 04/27/2020  . SUI (stress urinary incontinence, female)   . Hypertension   . History of anemia   . GERD (gastroesophageal reflux disease)   . History of alcoholism ([place]) 02/25/2020  . Intractable migraine with aura without status migrainosus 02/25/2020  . Attention deficit hyperactivity disorder (ADHD), predominantly inattentive type 01/07/2020  . GAD (generalized anxiety disorder) 01/07/2020  . Diverticulitis of large intestine with perforation and abscess 09/06/2019  . MDD (major depressive disorder) 10/07/2015    Past Surgical History:  Procedure Laterality Date  . BLADDER SUSPENSION N/A 02/13/2013   Procedure: [facility] SLING;  Surgeon: Malka So, MD;  Location: [facility];  Service: Urology;  Laterality: N/A;  . CESAREAN SECTION  12-23-2004    W/ LEFT TUBAL LIGATION AND RIGHT SALPINGECTOMY  . CESAREAN SECTION  1995  . IR RADIOLOGIST EVAL & MGMT  04/21/2020  . ORIF HUMERUS FRACTURE Right 04/28/2014   Procedure: OPEN REDUCTION INTERNAL FIXATION (ORIF) PROXIMAL HUMERUS FRACTURE;  Surgeon: Nita Sells, MD;  Location: [facility];  Service: Orthopedics;  Laterality: Right;  . ORIF PROXIMAL HUMERUS FRACTURE  Right 04/28/2014   DR CHANDLER  . TUBAL LIGATION       OB History   No obstetric history on file.     Family History  Adopted: Yes    Social History   Tobacco Use  . Smoking status: Former Smoker    Packs/day: 0.25    Years: 20.00    Pack years: 5.00    Types: Cigarettes    Quit date:  09/24/2019    Years since quitting: 0.5  . Smokeless tobacco: Never Used  Substance Use Topics  . Alcohol use: Yes    Comment: was in recovery for 4 months.   . Drug use: No    Home Medications Prior to Admission medications   Medication Sig Start Date End Date Taking? Authorizing Provider  acetaminophen (TYLENOL) 325 MG tablet Take 2 tablets (650 mg total) by mouth every 6 (six) hours as needed for mild pain (or temp > 100). 04/12/20  Yes Meuth, Brooke A, PA-C  Acetylcysteine (N-ACETYL-L-CYSTEINE) 600 MG CAPS Take 600 mg by mouth in the morning, at noon, and at bedtime.   Yes [provider]  ADZENYS XR-ODT 12.5 MG TBED Take 25 mg by mouth daily.  11/28/18  Yes [provider]  amoxicillin-clavulanate (AUGMENTIN) 875-125 MG tablet Take 1 tablet by mouth 2 (two) times daily. 04/26/20  Yes [provider]  BREZTRI AEROSPHERE 160-9-4.8 MCG/ACT AERO Inhale 1 puff into the lungs in the morning and at bedtime. 03/24/20  Yes [provider]  budesonide (PULMICORT) 0.5 MG/2ML nebulizer solution Take 2 mLs by nebulization daily. 03/17/20  Yes [provider]  cloNIDine (CATAPRES) 0.1 MG tablet Take 0.1 mg by mouth 4 (four) times daily.   Yes [provider]  FLUoxetine HCl 60 MG TABS Take 60 mg by mouth daily. 08/25/19  Yes [provider]  fluticasone (FLONASE) 50 MCG/ACT nasal spray Place 2 sprays into the nose daily as needed for allergies or rhinitis.  01/28/19  Yes [provider]  gabapentin (NEURONTIN) 300 MG capsule Take 300-900 mg by mouth See admin instructions. Take 1 capsule (300mg ) by mouth three times daily and 3 capsules (900mg ) by mouth at bedtime.   Yes [provider]  hydrOXYzine (VISTARIL) 25 MG capsule Take 25 mg by mouth 2 (two) times daily as needed for anxiety. 08/07/19  Yes [provider]  ipratropium-albuterol (DUONEB) 0.5-2.5 (3) MG/3ML SOLN Take 3 mLs by nebulization every 6 (six) hours as  needed. Patient taking differently: Take 3 mLs by nebulization every 6 (six) hours as needed (shortness of breath/wheezing).  09/10/19  Yes Tylene Fantasia, PA-C  Multiple Vitamin (MULTIVITAMIN WITH MINERALS) TABS tablet Take 1 tablet by mouth daily. 04/13/20  Yes Meuth, Blaine Hamper, PA-C  PROAIR HFA 108 (90 Base) MCG/ACT inhaler Inhale 2 puffs into the lungs every 4 (four) hours as needed for wheezing or shortness of breath.  11/24/18  Yes [provider]  progesterone (PROMETRIUM) 100 MG capsule Take 200 mg by mouth daily.  08/25/19  Yes [provider]  SUMAtriptan (IMITREX) 100 MG tablet Take 100 mg by mouth daily as needed for migraine. 03/09/20  Yes [provider]  topiramate (TOPAMAX) 50 MG tablet Take 50 mg by mouth daily. 03/08/20  Yes [provider]  traZODone (DESYREL) 100 MG tablet Take 100 mg by mouth at bedtime as needed for sleep.    Yes [provider]  Turmeric 500 MG CAPS Take 500 mg by mouth daily.   Yes  [provider]  Vitamin D, Ergocalciferol, (DRISDOL) 1.25 MG (50000 UNIT) CAPS capsule Take 50,000 Units by mouth once a week. 03/18/20  Yes [provider]    Allergies    Codeine and Hydrocodone  Review of Systems   Review of Systems  Constitutional: Negative for chills and fever.  Respiratory: Negative for shortness of breath.   Cardiovascular: Negative for chest pain.  Gastrointestinal: Positive for abdominal pain, diarrhea and nausea. Negative for blood in stool and vomiting.  Genitourinary: Negative for dysuria, frequency and hematuria.  Musculoskeletal: Negative for back pain.  Neurological: Negative for syncope and weakness.  All other systems reviewed and are negative.   Physical Exam Updated Vital Signs BP 105/83   Pulse 95   Temp 97.9 F (36.6 C) (Oral)   Resp 20   Ht 5\' 5"  (1.651 m)   Wt 65.8 kg   SpO2 99%   BMI 24.13 kg/m   Physical Exam Vitals and nursing note reviewed.  Constitutional:       General: She is not in acute distress.    Appearance: She is well-developed. She is not diaphoretic.  HENT:     Head: Normocephalic and atraumatic.     Mouth/Throat:     Mouth: Mucous membranes are moist.     Pharynx: Oropharynx is clear.  Eyes:     Conjunctiva/sclera: Conjunctivae normal.  Cardiovascular:     Rate and Rhythm: Normal rate and regular rhythm.     Pulses: Normal pulses.          Radial pulses are 2+ on the right side and 2+ on the left side.       Posterior tibial pulses are 2+ on the right side and 2+ on the left side.     Heart sounds: Normal heart sounds.     Comments: Tactile temperature in the extremities appropriate and equal bilaterally. Pulmonary:     Effort: Pulmonary effort is normal. No respiratory distress.     Breath sounds: Normal breath sounds.  Abdominal:     General: Bowel sounds are normal.     Palpations: Abdomen is soft.     Tenderness: There is abdominal tenderness in the left lower quadrant. There is no guarding.     Comments: Drain in place in LLQ. Thick appearing stool colored drainage noted in tubing to the collection bag. Minimal collection in the bag.   Musculoskeletal:     Cervical back: Neck supple.     Right lower leg: No edema.     Left lower leg: No edema.  Lymphadenopathy:     Cervical: No cervical adenopathy.  Skin:    General: Skin is warm and dry.  Neurological:     Mental Status: She is alert.  Psychiatric:        Mood and Affect: Mood and affect normal.        Speech: Speech normal.        Behavior: Behavior normal.     ED Results / Procedures / Treatments   Labs (all labs ordered are listed, but only abnormal results are displayed) Labs Reviewed  COMPREHENSIVE METABOLIC PANEL - Abnormal; Notable for the following components:      Result Value   Calcium 8.5 (*)    AST 13 (*)    All other components within normal limits  CBC - Abnormal; Notable for the following components:   WBC 10.6 (*)    All other  components within normal limits  URINALYSIS, ROUTINE W REFLEX MICROSCOPIC -  Abnormal; Notable for the following components:   Leukocytes,Ua TRACE (*)    Bacteria, UA RARE (*)    All other components within normal limits  SARS CORONAVIRUS 2 BY RT PCR (HOSPITAL ORDER, [facility])  LIPASE, BLOOD  CBC  CREATININE, SERUM    EKG None  Radiology CT ABDOMEN PELVIS W CONTRAST  Result Date: 04/27/2020 CLINICAL DATA:  54 year old female with history of left lower quadrant abdominal pain. Recent history of diverticulitis. EXAM: CT ABDOMEN AND PELVIS WITH CONTRAST TECHNIQUE: Multidetector CT imaging of the abdomen and pelvis was performed using the standard protocol following bolus administration of intravenous contrast. CONTRAST:  180mL OMNIPAQUE IOHEXOL 300 MG/ML  SOLN COMPARISON:  CT the abdomen and pelvis 04/21/2020. FINDINGS: Lower chest: Small pulmonary nodules are noted in the lungs bilaterally, largest of which measures 5 mm in right lower lobe (axial image 11 of series 6), unchanged. Increasing subsegmental atelectasis in the right lower lobe. Hepatobiliary: No suspicious cystic or solid hepatic lesions. No intra or extrahepatic biliary ductal dilatation. Gallbladder is normal in appearance. Pancreas: No pancreatic mass. No pancreatic ductal dilatation. No pancreatic or peripancreatic fluid collections or inflammatory changes. Spleen: Unremarkable. Adrenals/Urinary Tract: Bilateral kidneys and adrenal glands are normal in appearance. No hydroureteronephrosis. Urinary bladder is normal in appearance. Stomach/Bowel: Normal appearance of the stomach. No pathologic dilatation of small bowel or colon. Numerous colonic diverticuli are noted. Inflammatory changes are again evident adjacent to the descending colon. One of the previously noted left-sided percutaneous drainage catheters has been removed. The other pigtail drainage catheter remains in position adjacent to the descending  colon. The collection around the catheter appears completely decompressed, although there is a small fistulous tract containing fluid and gas with rim enhancement which extends laterally coming in direct contact with the lateral abdominal wall musculature. This is best appreciated on axial image 34 of series 2 and coronal image 56 of series 4 measuring approximately 1.2 x 3.9 x 1.2 cm. Vascular/Lymphatic: No significant atherosclerotic disease, aneurysm or dissection noted in the abdominal or pelvic vasculature. No lymphadenopathy noted in the abdomen or pelvis. Reproductive: Uterus and ovaries are unremarkable in appearance. Other: No significant volume of ascites.  No pneumoperitoneum. Musculoskeletal: There are no aggressive appearing lytic or blastic lesions noted in the visualized portions of the skeleton. IMPRESSION: 1. Persistent evidence of diverticulitis in the descending colon. One of the drainage catheters has been removed. The other drainage catheter appears within a decompressed cavity, however, there is a fistulous tract extending laterally from the cavity tracking underneath the lateral abdominal wall musculature, as detailed above. Persistent surrounding inflammatory changes. 2. Increasing subsegmental atelectasis in the right lower lobe. 3. Small pulmonary nodules in the lung bases bilaterally measuring 5 mm or less in size, stable compared to the prior examination. No follow-up needed if patient is low-risk (and has no known or suspected primary neoplasm). Non-contrast chest CT can be considered in 12 months if patient is high-risk. This recommendation follows the consensus statement: Guidelines for Management of Incidental Pulmonary Nodules Detected on CT Images: From the Fleischner Society 2017; Radiology 2017; 284:228-243. Electronically Signed   By: Vinnie Langton M.D.   On: 04/27/2020 10:08    Procedures Procedures (including critical care time)  Medications Ordered in ED Medications    sodium chloride (PF) 0.9 % injection (has no administration in time range)  Amphetamine ER TBED 25 mg (has no administration in time range)  Budeson-Glycopyrrol-Formoterol 160-9-4.8 MCG/ACT AERO 1 puff (has no administration in  time range)  budesonide (PULMICORT) nebulizer solution 0.5 mg (has no administration in time range)  cloNIDine (CATAPRES) tablet 0.1 mg (has no administration in time range)  FLUoxetine HCl TABS 60 mg (has no administration in time range)  fluticasone (FLONASE) 50 MCG/ACT nasal spray 2 spray (has no administration in time range)  gabapentin (NEURONTIN) capsule 300 mg (has no administration in time range)  hydrOXYzine (VISTARIL) capsule 25 mg (has no administration in time range)  albuterol (VENTOLIN HFA) 108 (90 Base) MCG/ACT inhaler 2 puff (has no administration in time range)  progesterone (PROMETRIUM) capsule 200 mg (has no administration in time range)  SUMAtriptan (IMITREX) tablet 100 mg (has no administration in time range)  topiramate (TOPAMAX) tablet 50 mg (has no administration in time range)  traZODone (DESYREL) tablet 100 mg (has no administration in time range)  gabapentin (NEURONTIN) capsule 900 mg (has no administration in time range)  enoxaparin (LOVENOX) injection 40 mg (has no administration in time range)  dextrose 5 % and 0.45 % NaCl with KCl 10 mEq/L infusion (has no administration in time range)  piperacillin-tazobactam (ZOSYN) IVPB 3.375 g (has no administration in time range)  acetaminophen (TYLENOL) tablet 650 mg (has no administration in time range)    Or  acetaminophen (TYLENOL) suppository 650 mg (has no administration in time range)  oxyCODONE (Oxy IR/ROXICODONE) immediate release tablet 5-10 mg (has no administration in time range)  HYDROmorphone (DILAUDID) injection 0.5-1 mg (has no administration in time range)  polyethylene glycol (MIRALAX / GLYCOLAX) packet 17 g (has no administration in time range)  ondansetron (ZOFRAN-ODT)  disintegrating tablet 4 mg (has no administration in time range)    Or  ondansetron (ZOFRAN) injection 4 mg (has no administration in time range)  metoprolol tartrate (LOPRESSOR) injection 5 mg (has no administration in time range)  sodium chloride 0.9 % bolus 1,000 mL (0 mLs Intravenous Stopped 04/27/20 1118)  ondansetron (ZOFRAN) injection 4 mg (4 mg Intravenous Given 04/27/20 0808)  fentaNYL (SUBLIMAZE) injection 50 mcg (50 mcg Intravenous Given 04/27/20 0808)  iohexol (OMNIPAQUE) 300 MG/ML solution 100 mL (100 mLs Intravenous Contrast Given 04/27/20 0937)  morphine 4 MG/ML injection 4 mg (2 mg Intravenous Given 04/27/20 1115)    ED Course  I have reviewed the triage vital signs and the nursing notes.  Pertinent labs & imaging results that were available during my care of the patient were reviewed by me and considered in my medical decision making (see chart for details).  Clinical Course as of Apr 27 1201  Tue Apr 27, 2020  1043 Discussed incidental finding of pulmonary nodules. She states she can follow up on this with her pulmonologist, Dr. Lanney Gins, in [place].  Quite smoking Oct 2020, starting age 59, 1-1.5 ppd. No known family history of lung cancer, however, she is adopted and does not know much about her birth family history.  Her malignancy risk was calculated to be 0.61% using the [facility] cancer prediction equation from UpToDate, which puts the patient in the low risk category.  CT ABDOMEN PELVIS W CONTRAST [[facility]]  [id number] Spoke with Claiborne Billings, [place] with general surgery. States she will come see the patient.   [[facility]]    Clinical Course User Index [[facility]] Daishaun Ayre C, PA-C   MDM Rules/Calculators/A&P                      Patient presents with recurrent left lower quadrant pain for the last several days. Patient is nontoxic appearing, afebrile, not  tachycardic, not tachypneic, not hypotensive, maintains excellent SPO2 on room air.   I have reviewed the patient's chart to obtain  more information.  I reviewed and interpreted the patient's labs and radiological studies. 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 Mild leukocytosis. Admitted via general surgery team.  They placed orders for further management, including antibiotics.  Pancreatic cysts previously noted as recently as CT on May 19, were not noted on today's study.  Comment on the May 19 study indicates the masses have had no noted changes over the last several years, are indicated to be benign, and no follow-up recommended.   Findings and plan of care discussed with Lacretia Leigh, MD.   Final Clinical Impression(s) / ED Diagnoses Final diagnoses:  Diverticulitis    Rx / DC Orders ED Discharge Orders    None       Layla Maw 04/27/20 1203    Lacretia Leigh, MD 04/28/20 1259

## 2020-04-27 NOTE — H&P (Addendum)
[facility] Admission Note  [name] November 03, 1966  [id number].    Requesting MD: Arlean Hopping PA-C Chief Complaint/Reason for Consult: diverticulitis  HPI:   Patient is a 54 year old female who was recently admitted with diverticulitis and abscess from 04/03/20 to 04/12/20. She underwent placement of IR drain x2 04/09/20. She was doing well on discharge and had finished course of augmentin. She followed up in IR drain clinic on 5/19 and one drain was removed, output from other drain was low at that time and she was told to stop flushing drain.   Patient reports that abdominal pain started back again this past Saturday in the LLQ and has been crampy. She reports she has also had increase in output from remaining drain and the drainage has gone from light to darker brown and malodorous. She had some diarrhea last night and some increased pain associated with this. Denies fever, chills, chest pain, SOB, urinary symptoms. PMH otherwise significant for HTN, GERD, Anxiety, Hx Etoh abuse, and RLL pulmonary nodule noted on last workup. Past abdominal surgery cesarean x2 and bladder sling.   ROS: Review of Systems  Constitutional: Negative for chills and fever.  Respiratory: Negative for shortness of breath.   Cardiovascular: Negative for chest pain.  Gastrointestinal: Positive for abdominal pain, diarrhea and nausea. Negative for vomiting.       Increased output from LLQ drain   Genitourinary: Negative for dysuria, frequency and urgency.  All other systems reviewed and are negative.   Family History  Adopted: Yes    Past Medical History:  Diagnosis Date  . ADHD (attention deficit hyperactivity disorder)   . Alcohol dependence with acute alcoholic intoxication with complication ([place]) 0000000  . Anemia   . Anxiety   . Cancer ([place])    bladder  . Cutaneous lupus erythematosus   . 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 GERD (gastroesophageal reflux disease)   .  History of anemia   . Hypertension   . Severe episode of recurrent major depressive disorder, without psychotic features ([place]) 01/07/2020  . SUI (stress urinary incontinence, female)     Past Surgical History:  Procedure Laterality Date  . BLADDER SUSPENSION N/A 02/13/2013   Procedure: [facility] SLING;  Surgeon: Malka So, MD;  Location: [facility];  Service: Urology;  Laterality: N/A;  . CESAREAN SECTION  12-23-2004    W/ LEFT TUBAL LIGATION AND RIGHT SALPINGECTOMY  . CESAREAN SECTION  1995  . IR RADIOLOGIST EVAL & MGMT  04/21/2020  . ORIF HUMERUS FRACTURE Right 04/28/2014   Procedure: OPEN REDUCTION INTERNAL FIXATION (ORIF) PROXIMAL HUMERUS FRACTURE;  Surgeon: Nita Sells, MD;  Location: [place];  Service: Orthopedics;  Laterality: Right;  . ORIF PROXIMAL HUMERUS FRACTURE Right 04/28/2014   DR CHANDLER  . TUBAL LIGATION      Social History:  reports that she quit smoking about 7 months ago. Her smoking use included cigarettes. She has a 5.00 pack-year smoking history. She has never used smokeless tobacco. She reports current alcohol use. She reports that she does not use drugs.  Allergies:  Allergies  Allergen Reactions  . Codeine Nausea And Vomiting and Other (See Comments)    dizziness Other reaction(s): Dizziness, Other (See Comments) dizziness  . Hydrocodone     dizzy    (Not in a hospital admission)   Blood pressure 104/70, pulse 80, temperature 97.9 F (36.6 C), temperature source Oral, resp. rate 16, height 5\' 5"  (1.651 m), weight  65.8 kg, SpO2 99 %. Physical Exam:  General: pleasant, WD, WN white female who is laying in bed in NAD HEENT: Sclera are noninjected.  PERRL.  Ears and nose without any masses or lesions.  Mouth is pink and moist Heart: regular, rate, and rhythm.  Normal s1,s2. No obvious murmurs, gallops, or rubs noted.  Palpable radial and pedal pulses bilaterally Lungs: CTAB, no wheezes, rhonchi, or rales noted.  Respiratory effort  nonlabored Abd: soft, TTP in LUQ and LLQ, ND, +BS, drain in LLQ to gravity with feculent appearing material in bag MS: all 4 extremities are symmetrical with no cyanosis, clubbing, or edema. Skin: warm and dry with no masses, lesions, or rashes Neuro: Cranial nerves 2-12 grossly intact, sensation grossly intact throughout Psych: A&Ox3 with an appropriate affect.   Results for orders placed or performed during the hospital encounter of 04/27/20 (from the past 48 hour(s))  Lipase, blood     Status: None   Collection Time: 04/27/20  1:59 AM  Result Value Ref Range   Lipase 36 11 - 51 U/L    Comment: Performed at [facility], [place] [address], [place], [place] [address]  Comprehensive metabolic panel     Status: Abnormal   Collection Time: 04/27/20  1:59 AM  Result Value Ref Range   Sodium 138 135 - 145 mmol/L   Potassium 4.0 3.5 - 5.1 mmol/L   Chloride 109 98 - 111 mmol/L   CO2 22 22 - 32 mmol/L   Glucose, Bld 87 70 - 99 mg/dL    Comment: Glucose reference range applies only to samples taken after fasting for at least 8 hours.   BUN 13 6 - 20 mg/dL   Creatinine, Ser 0.73 0.44 - 1.00 mg/dL   Calcium 8.5 (L) 8.9 - 10.3 mg/dL   Total Protein 7.0 6.5 - 8.1 g/dL   Albumin 3.8 3.5 - 5.0 g/dL   AST 13 (L) 15 - 41 U/L   ALT 11 0 - 44 U/L   Alkaline Phosphatase 84 38 - 126 U/L   Total Bilirubin 0.6 0.3 - 1.2 mg/dL   GFR calc non Af Amer >60 >60 mL/min   GFR calc Af Amer >60 >60 mL/min   Anion gap 7 5 - 15    Comment: Performed at [facility], [place] [address], [place], [place] [address]  CBC     Status: Abnormal   Collection Time: 04/27/20  1:59 AM  Result Value Ref Range   WBC 10.6 (H) 4.0 - 10.5 K/uL   RBC 4.42 3.87 - 5.11 MIL/uL   Hemoglobin 12.6 12.0 - 15.0 g/dL   HCT 40.6 36.0 - 46.0 %   MCV 91.9 80.0 - 100.0 fL   MCH 28.5 26.0 - 34.0 pg   MCHC 31.0 30.0 - 36.0 g/dL   RDW 13.8 11.5 - 15.5 %   Platelets 281 150 - 400 K/uL   nRBC 0.0 0.0 - 0.2  %    Comment: Performed at [facility], [place] [address], [place], [place] [address]  Urinalysis, Routine w reflex microscopic     Status: Abnormal   Collection Time: 04/27/20  9:58 AM  Result Value Ref Range   Color, Urine YELLOW YELLOW   APPearance CLEAR CLEAR   Specific Gravity, Urine 1.030 1.005 - 1.030   pH 6.0 5.0 - 8.0   Glucose, UA NEGATIVE NEGATIVE mg/dL   Hgb urine dipstick NEGATIVE NEGATIVE   Bilirubin Urine NEGATIVE NEGATIVE  Ketones, ur NEGATIVE NEGATIVE mg/dL   Protein, ur NEGATIVE NEGATIVE mg/dL   Nitrite NEGATIVE NEGATIVE   Leukocytes,Ua TRACE (A) NEGATIVE   RBC / HPF 0-5 0 - 5 RBC/hpf   WBC, UA 0-5 0 - 5 WBC/hpf   Bacteria, UA RARE (A) NONE SEEN   Squamous Epithelial / LPF 0-5 0 - 5   Hyaline Casts, UA PRESENT     Comment: Performed at [facility], [place] [address] [place], [place] [address]   CT ABDOMEN PELVIS W CONTRAST  Result Date: 04/27/2020 CLINICAL DATA:  54 year old female with history of left lower quadrant abdominal pain. Recent history of diverticulitis. EXAM: CT ABDOMEN AND PELVIS WITH CONTRAST TECHNIQUE: Multidetector CT imaging of the abdomen and pelvis was performed using the standard protocol following bolus administration of intravenous contrast. CONTRAST:  113mL OMNIPAQUE IOHEXOL 300 MG/ML  SOLN COMPARISON:  CT the abdomen and pelvis 04/21/2020. FINDINGS: Lower chest: Small pulmonary nodules are noted in the lungs bilaterally, largest of which measures 5 mm in right lower lobe (axial image 11 of series 6), unchanged. Increasing subsegmental atelectasis in the right lower lobe. Hepatobiliary: No suspicious cystic or solid hepatic lesions. No intra or extrahepatic biliary ductal dilatation. Gallbladder is normal in appearance. Pancreas: No pancreatic mass. No pancreatic ductal dilatation. No pancreatic or peripancreatic fluid collections or inflammatory changes. Spleen: Unremarkable. Adrenals/Urinary Tract: Bilateral  kidneys and adrenal glands are normal in appearance. No hydroureteronephrosis. Urinary bladder is normal in appearance. Stomach/Bowel: Normal appearance of the stomach. No pathologic dilatation of small bowel or colon. Numerous colonic diverticuli are noted. Inflammatory changes are again evident adjacent to the descending colon. One of the previously noted left-sided percutaneous drainage catheters has been removed. The other pigtail drainage catheter remains in position adjacent to the descending colon. The collection around the catheter appears completely decompressed, although there is a small fistulous tract containing fluid and gas with rim enhancement which extends laterally coming in direct contact with the lateral abdominal wall musculature. This is best appreciated on axial image 34 of series 2 and coronal image 56 of series 4 measuring approximately 1.2 x 3.9 x 1.2 cm. Vascular/Lymphatic: No significant atherosclerotic disease, aneurysm or dissection noted in the abdominal or pelvic vasculature. No lymphadenopathy noted in the abdomen or pelvis. Reproductive: Uterus and ovaries are unremarkable in appearance. Other: No significant volume of ascites.  No pneumoperitoneum. Musculoskeletal: There are no aggressive appearing lytic or blastic lesions noted in the visualized portions of the skeleton. IMPRESSION: 1. Persistent evidence of diverticulitis in the descending colon. One of the drainage catheters has been removed. The other drainage catheter appears within a decompressed cavity, however, there is a fistulous tract extending laterally from the cavity tracking underneath the lateral abdominal wall musculature, as detailed above. Persistent surrounding inflammatory changes. 2. Increasing subsegmental atelectasis in the right lower lobe. 3. Small pulmonary nodules in the lung bases bilaterally measuring 5 mm or less in size, stable compared to the prior examination. No follow-up needed if patient is  low-risk (and has no known or suspected primary neoplasm). Non-contrast chest CT can be considered in 12 months if patient is high-risk. This recommendation follows the consensus statement: Guidelines for Management of Incidental Pulmonary Nodules Detected on CT Images: From the Fleischner Society 2017; Radiology 2017; 284:228-243. Electronically Signed   By: Vinnie Langton M.D.   On: 04/27/2020 10:08      Assessment/Plan HTN GERD Anxiety Hx Etoh abuse RLL nodule - recommend follow up CT 1 year  Recurrent  diverticulitis with fistula   -CT 5/1 showed evidence of acute diverticulitis of the mid to distal descending colon with suggestion of a small associated diverticular abscess measuring 1.1 x 1.8 cm  - IR drains x 2 placed 5/7  - one of IR drains was removed last week - CT today shows persistent diverticulitis, fistulous tract and persistent surrounding inflammatory changes - WBC 10.6, pt afeb, ttp in LLQ, drain with feculent material   ID -zosyn 5/25>> FEN -ice chips, IVF VTE -SCDs, lovenox Foley -none  Admit to inpatient. Will ask IR to take a look at remaining drain and make sure we don't need to flush drain. IV abx and bowel rest.   Hopefully will be able to get patient better without surgery acutely, may need to remain on abx and keep drain until surgery. If patient does not improve or worsens, may need to consider Hartmann's.   Norm Parcel, [facility] Surgery 04/27/2020, 11:39 AM Please see Amion for pager number during day hours 7:00am-4:30pm  Agree with above. Her PCP is Dr. Gorden Harms at [place].  She had a colonoscopy in January 2021 by Dr. Cristina Gong. She is married.  She has 2 children:  66 yo and 29 yo.  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  She is uncomfortable, but not sick looking.  Her WBC in 10,600.  Her drain has feculent material, consistent with a fistula.  Alphonsa Overall, MD, [facility]  Surgery Office phone:  [phone]

## 2020-04-27 NOTE — ED Notes (Signed)
Transport called to take pt to floor.  

## 2020-04-27 NOTE — ED Notes (Signed)
Surgery team at bedside speaking with patient.

## 2020-04-27 NOTE — ED Triage Notes (Addendum)
Recently diagnosed with diverticulitis and admitted with hospital. Today noticed different color material in drain bag. New onset diarrhea and LLQ pain.

## 2020-04-27 NOTE — ED Notes (Signed)
ED TO INPATIENT HANDOFF REPORT  Name/Age/Gender [name] 54 y.o. female  Code Status    Code Status Orders  (From admission, onward)         Start     Ordered   04/27/20 1134  Full code  Continuous     04/27/20 1139        Code Status History    Date Active Date Inactive Code Status Order ID Comments User Context   04/03/2020 1309 04/12/2020 1906 Full Code [id number]  Armandina Gemma, MD ED   09/06/2019 1802 09/10/2019 1927 Full Code [id number]  Jules Husbands, MD ED   01/04/2016 2247 01/10/2016 1726 Full Code [id number]  Laverle Hobby, PA-C Inpatient   10/07/2015 1249 10/09/2015 1959 Full Code [id number]  Kerrie Buffalo, NP Inpatient   10/06/2015 2310 10/07/2015 1249 Full Code [id number]  Laverle Hobby, PA-C Inpatient   10/06/2015 1608 10/06/2015 2310 Full Code [id number]  Jeannett Senior, PA-C ED   04/28/2014 1634 04/29/2014 1639 Full Code [id number]  Grier Mitts, PA-C Inpatient   Advance Care Planning Activity      Home/SNF/Other Home  Chief Complaint Diverticulitis [K57.92]  Level of Care/Admitting Diagnosis ED Disposition    ED Disposition Condition [facility] Area: [facility] [[id number]]  Level of Care: Med-Surg [16]  May admit patient to [facility] or [facility] if equivalent level of care is available:: No  Covid Evaluation: Asymptomatic Screening Protocol (No Symptoms)  Diagnosis: Diverticulitis [id number]  Admitting Physician: [place], Byron  Attending Physician: CCS, MD [[id number]]  Estimated length of stay: past midnight tomorrow  Certification:: I certify this patient will need inpatient services for at least 2 midnights       Medical History Past Medical History:  Diagnosis Date  . ADHD (attention deficit hyperactivity disorder)   . Alcohol dependence with acute alcoholic intoxication with complication ([place]) 0000000  . Anemia   . Anxiety   . Cancer ([place])    bladder  . Cutaneous lupus erythematosus   . 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 GERD (gastroesophageal reflux disease)   . History of anemia   . Hypertension   . Severe episode of recurrent major depressive disorder, without psychotic features ([place]) 01/07/2020  . SUI (stress urinary incontinence, female)     Allergies Allergies  Allergen Reactions  . Codeine Nausea And Vomiting and Other (See Comments)    dizziness Other reaction(s): Dizziness, Other (See Comments) dizziness  . Hydrocodone     dizzy    IV Location/Drains/Wounds Patient Lines/Drains/Airways Status   Active Line/Drains/Airways    Name:   Placement date:   Placement time:   Site:   Days:   Peripheral IV 04/10/20 Anterior;Left Forearm   04/10/20    1733    Forearm   17   Peripheral IV 04/27/20 Right Antecubital   04/27/20    0807    Antecubital   less than 1   Closed System Drain 1 Left;Lateral;Proximal Abdomen Other (Comment) 10 Fr.   04/09/20    1600    Abdomen   18   Closed System Drain 2 Left;Lateral;Distal Abdomen Other (Comment) 10 Fr.   04/09/20    1603    Abdomen   18   Incision 02/13/13 Perineum Other (Comment)   02/13/13    0827     2630   Incision - 2 Ports Abdomen 1: Left;Lower Right;Lower   02/13/13    0756  2630          Labs/Imaging Results for orders placed or performed during the hospital encounter of 04/27/20 (from the past 48 hour(s))  Lipase, blood     Status: None   Collection Time: 04/27/20  1:59 AM  Result Value Ref Range   Lipase 36 11 - 51 U/L    Comment: Performed at [facility], [place] [address] [place], [place] [address]  Comprehensive metabolic panel     Status: Abnormal   Collection Time: 04/27/20  1:59 AM  Result Value Ref Range   Sodium 138 135 - 145 mmol/L   Potassium 4.0 3.5 - 5.1 mmol/L   Chloride 109 98 - 111 mmol/L   CO2 22 22 - 32 mmol/L   Glucose, Bld 87 70 - 99 mg/dL    Comment: Glucose reference range applies only to samples taken after fasting for at least 8 hours.   BUN 13 6  - 20 mg/dL   Creatinine, Ser 0.73 0.44 - 1.00 mg/dL   Calcium 8.5 (L) 8.9 - 10.3 mg/dL   Total Protein 7.0 6.5 - 8.1 g/dL   Albumin 3.8 3.5 - 5.0 g/dL   AST 13 (L) 15 - 41 U/L   ALT 11 0 - 44 U/L   Alkaline Phosphatase 84 38 - 126 U/L   Total Bilirubin 0.6 0.3 - 1.2 mg/dL   GFR calc non Af Amer >60 >60 mL/min   GFR calc Af Amer >60 >60 mL/min   Anion gap 7 5 - 15    Comment: Performed at [facility], [place] [address] [place], [place] [address]  CBC     Status: Abnormal   Collection Time: 04/27/20  1:59 AM  Result Value Ref Range   WBC 10.6 (H) 4.0 - 10.5 K/uL   RBC 4.42 3.87 - 5.11 MIL/uL   Hemoglobin 12.6 12.0 - 15.0 g/dL   HCT 40.6 36.0 - 46.0 %   MCV 91.9 80.0 - 100.0 fL   MCH 28.5 26.0 - 34.0 pg   MCHC 31.0 30.0 - 36.0 g/dL   RDW 13.8 11.5 - 15.5 %   Platelets 281 150 - 400 K/uL   nRBC 0.0 0.0 - 0.2 %    Comment: Performed at [facility], [place] [address] [place], [place] [address]  Urinalysis, Routine w reflex microscopic     Status: Abnormal   Collection Time: 04/27/20  9:58 AM  Result Value Ref Range   Color, Urine YELLOW YELLOW   APPearance CLEAR CLEAR   Specific Gravity, Urine 1.030 1.005 - 1.030   pH 6.0 5.0 - 8.0   Glucose, UA NEGATIVE NEGATIVE mg/dL   Hgb urine dipstick NEGATIVE NEGATIVE   Bilirubin Urine NEGATIVE NEGATIVE   Ketones, ur NEGATIVE NEGATIVE mg/dL   Protein, ur NEGATIVE NEGATIVE mg/dL   Nitrite NEGATIVE NEGATIVE   Leukocytes,Ua TRACE (A) NEGATIVE   RBC / HPF 0-5 0 - 5 RBC/hpf   WBC, UA 0-5 0 - 5 WBC/hpf   Bacteria, UA RARE (A) NONE SEEN   Squamous Epithelial / LPF 0-5 0 - 5   Hyaline Casts, UA PRESENT     Comment: Performed at [facility], [place] [address], [place], [place] [address]  Creatinine, serum     Status: None   Collection Time: 04/27/20 11:53 AM  Result Value Ref Range   Creatinine, Ser 0.49 0.44 - 1.00 mg/dL   GFR calc non Af Amer >60 >60 mL/min  GFR calc Af Amer >60 >60  mL/min    Comment: Performed at [facility], [place] [address] [place], [place] [address]  SARS Coronavirus 2 by RT PCR (hospital order, performed in [facility] hospital lab) Nasopharyngeal Nasopharyngeal Swab     Status: None   Collection Time: 04/27/20 11:53 AM   Specimen: Nasopharyngeal Swab  Result Value Ref Range   SARS Coronavirus 2 NEGATIVE NEGATIVE    Comment: (NOTE) SARS-CoV-2 target nucleic acids are NOT DETECTED. The SARS-CoV-2 RNA is generally detectable in upper and lower respiratory specimens during the acute phase of infection. The lowest concentration of SARS-CoV-2 viral copies this assay can detect is 250 copies / mL. A negative result does not preclude SARS-CoV-2 infection and should not be used as the sole basis for treatment or other patient management decisions.  A negative result may occur with improper specimen collection / handling, submission of specimen other than nasopharyngeal swab, presence of viral mutation(s) within the areas targeted by this assay, and inadequate number of viral copies (<250 copies / mL). A negative result must be combined with clinical observations, patient history, and epidemiological information. Fact Sheet for Patients:   StrictlyIdeas.no Fact Sheet for Healthcare Providers: BankingDealers.co.za This test is not yet approved or cleared  by the [place] FDA and has been authorized for detection and/or diagnosis of SARS-CoV-2 by FDA under an Emergency Use Authorization (EUA).  This EUA will remain in effect (meaning this test can be used) for the duration of the COVID-19 declaration under Section 564(b)(1) of the Act, 21 U.S.C. section 360bbb-3(b)(1), unless the authorization is terminated or revoked sooner. Performed at [facility], [place] [address] [place],  [address]    CT ABDOMEN PELVIS W CONTRAST  Result Date: 04/27/2020 CLINICAL  DATA:  54 year old female with history of left lower quadrant abdominal pain. Recent history of diverticulitis. EXAM: CT ABDOMEN AND PELVIS WITH CONTRAST TECHNIQUE: Multidetector CT imaging of the abdomen and pelvis was performed using the standard protocol following bolus administration of intravenous contrast. CONTRAST:  164mL OMNIPAQUE IOHEXOL 300 MG/ML  SOLN COMPARISON:  CT the abdomen and pelvis 04/21/2020. FINDINGS: Lower chest: Small pulmonary nodules are noted in the lungs bilaterally, largest of which measures 5 mm in right lower lobe (axial image 11 of series 6), unchanged. Increasing subsegmental atelectasis in the right lower lobe. Hepatobiliary: No suspicious cystic or solid hepatic lesions. No intra or extrahepatic biliary ductal dilatation. Gallbladder is normal in appearance. Pancreas: No pancreatic mass. No pancreatic ductal dilatation. No pancreatic or peripancreatic fluid collections or inflammatory changes. Spleen: Unremarkable. Adrenals/Urinary Tract: Bilateral kidneys and adrenal glands are normal in appearance. No hydroureteronephrosis. Urinary bladder is normal in appearance. Stomach/Bowel: Normal appearance of the stomach. No pathologic dilatation of small bowel or colon. Numerous colonic diverticuli are noted. Inflammatory changes are again evident adjacent to the descending colon. One of the previously noted left-sided percutaneous drainage catheters has been removed. The other pigtail drainage catheter remains in position adjacent to the descending colon. The collection around the catheter appears completely decompressed, although there is a small fistulous tract containing fluid and gas with rim enhancement which extends laterally coming in direct contact with the lateral abdominal wall musculature. This is best appreciated on axial image 34 of series 2 and coronal image 56 of series 4 measuring approximately 1.2 x 3.9 x 1.2 cm. Vascular/Lymphatic: No significant atherosclerotic disease,  aneurysm or dissection noted in the abdominal or pelvic vasculature. No lymphadenopathy noted in the abdomen  or pelvis. Reproductive: Uterus and ovaries are unremarkable in appearance. Other: No significant volume of ascites.  No pneumoperitoneum. Musculoskeletal: There are no aggressive appearing lytic or blastic lesions noted in the visualized portions of the skeleton. IMPRESSION: 1. Persistent evidence of diverticulitis in the descending colon. One of the drainage catheters has been removed. The other drainage catheter appears within a decompressed cavity, however, there is a fistulous tract extending laterally from the cavity tracking underneath the lateral abdominal wall musculature, as detailed above. Persistent surrounding inflammatory changes. 2. Increasing subsegmental atelectasis in the right lower lobe. 3. Small pulmonary nodules in the lung bases bilaterally measuring 5 mm or less in size, stable compared to the prior examination. No follow-up needed if patient is low-risk (and has no known or suspected primary neoplasm). Non-contrast chest CT can be considered in 12 months if patient is high-risk. This recommendation follows the consensus statement: Guidelines for Management of Incidental Pulmonary Nodules Detected on CT Images: From the Fleischner Society 2017; Radiology 2017; 284:228-243. Electronically Signed   By: Vinnie Langton M.D.   On: 04/27/2020 10:08    Pending Labs Unresulted Labs (From admission, onward)    Start     Ordered   05/04/20 0500  Creatinine, serum  (enoxaparin (LOVENOX)    CrCl >/= 30 ml/min)  Weekly,   R    Comments: while on enoxaparin therapy    04/27/20 1139   04/28/20 [id number]  Basic metabolic panel  Tomorrow morning,   R     04/27/20 1139   04/28/20 0500  CBC  Tomorrow morning,   R     04/27/20 1139   04/27/20 1211  CBC  Once,   R     04/27/20 1211          Vitals/Pain Today's Vitals   04/27/20 1245 04/27/20 1300 04/27/20 1400 04/27/20 1430  BP: 99/68  95/63 102/68 114/75  Pulse: 77 74 76 90  Resp:  16 16 16   Temp:      TempSrc:      SpO2: 96% 96% 97% 98%  Weight:      Height:      PainSc:        Isolation Precautions No active isolations  Medications Medications  sodium chloride (PF) 0.9 % injection (has no administration in time range)  Amphetamine ER TBED 25 mg (has no administration in time range)  Budeson-Glycopyrrol-Formoterol 160-9-4.8 MCG/ACT AERO 1 puff (has no administration in time range)  budesonide (PULMICORT) nebulizer solution 0.5 mg (0.5 mg Nebulization Not Given 04/27/20 1244)  cloNIDine (CATAPRES) tablet 0.1 mg (has no administration in time range)  FLUoxetine HCl TABS 60 mg (has no administration in time range)  fluticasone (FLONASE) 50 MCG/ACT nasal spray 2 spray (has no administration in time range)  gabapentin (NEURONTIN) capsule 300 mg (has no administration in time range)  albuterol (PROVENTIL) (2.5 MG/3ML) 0.083% nebulizer solution 2.5 mg (has no administration in time range)  progesterone (PROMETRIUM) capsule 200 mg (has no administration in time range)  SUMAtriptan (IMITREX) tablet 100 mg (has no administration in time range)  topiramate (TOPAMAX) tablet 50 mg (has no administration in time range)  traZODone (DESYREL) tablet 100 mg (has no administration in time range)  gabapentin (NEURONTIN) capsule 900 mg (has no administration in time range)  enoxaparin (LOVENOX) injection 40 mg (has no administration in time range)  dextrose 5 % and 0.45 % NaCl with KCl 10 mEq/L infusion (has no administration in time range)  piperacillin-tazobactam (ZOSYN) IVPB 3.375 g (  has no administration in time range)  acetaminophen (TYLENOL) tablet 650 mg (has no administration in time range)    Or  acetaminophen (TYLENOL) suppository 650 mg (has no administration in time range)  oxyCODONE (Oxy IR/ROXICODONE) immediate release tablet 5-10 mg (10 mg Oral Given 04/27/20 1417)  HYDROmorphone (DILAUDID) injection 0.5-1 mg (has no  administration in time range)  polyethylene glycol (MIRALAX / GLYCOLAX) packet 17 g (has no administration in time range)  ondansetron (ZOFRAN-ODT) disintegrating tablet 4 mg (has no administration in time range)    Or  ondansetron (ZOFRAN) injection 4 mg (has no administration in time range)  metoprolol tartrate (LOPRESSOR) injection 5 mg (has no administration in time range)  hydrOXYzine (ATARAX/VISTARIL) tablet 25 mg (has no administration in time range)  sodium chloride 0.9 % bolus 1,000 mL (0 mLs Intravenous Stopped 04/27/20 1118)  ondansetron (ZOFRAN) injection 4 mg (4 mg Intravenous Given 04/27/20 0808)  fentaNYL (SUBLIMAZE) injection 50 mcg (50 mcg Intravenous Given 04/27/20 0808)  iohexol (OMNIPAQUE) 300 MG/ML solution 100 mL (100 mLs Intravenous Contrast Given 04/27/20 0937)  morphine 4 MG/ML injection 4 mg (2 mg Intravenous Given 04/27/20 1115)    Mobility walks

## 2020-04-27 NOTE — ED Notes (Signed)
Patient transported to CT 

## 2020-04-27 NOTE — Progress Notes (Signed)
Dr. Laurence Ferrari with Interventional Radiology has reviewed this patient's CT scan. No flushing of the remaining drain is recommended at this time.   Please call interventional Radiology with any additional questions or concerns.   Soyla Dryer, NP

## 2020-04-28 LAB — CBC
HCT: 34.9 % — ABNORMAL LOW (ref 36.0–46.0)
Hemoglobin: 10.6 g/dL — ABNORMAL LOW (ref 12.0–15.0)
MCH: 29 pg (ref 26.0–34.0)
MCHC: 30.4 g/dL (ref 30.0–36.0)
MCV: 95.6 fL (ref 80.0–100.0)
Platelets: 198 10*3/uL (ref 150–400)
RBC: 3.65 MIL/uL — ABNORMAL LOW (ref 3.87–5.11)
RDW: 13.8 % (ref 11.5–15.5)
WBC: 7.1 10*3/uL (ref 4.0–10.5)
nRBC: 0 % (ref 0.0–0.2)

## 2020-04-28 LAB — BASIC METABOLIC PANEL
Anion gap: 5 (ref 5–15)
BUN: 9 mg/dL (ref 6–20)
CO2: 22 mmol/L (ref 22–32)
Calcium: 8.1 mg/dL — ABNORMAL LOW (ref 8.9–10.3)
Chloride: 109 mmol/L (ref 98–111)
Creatinine, Ser: 0.72 mg/dL (ref 0.44–1.00)
GFR calc Af Amer: >60 mL/min (ref >60)
GFR calc non Af Amer: >60 mL/min (ref >60)
Glucose, Bld: 112 mg/dL — ABNORMAL HIGH (ref 70–99)
Potassium: 4.2 mmol/L (ref 3.5–5.1)
Sodium: 136 mmol/L (ref 135–145)

## 2020-04-28 MED ORDER — ALVIMOPAN 12 MG PO CAPS
12.0000 mg | ORAL_CAPSULE | ORAL | Status: AC
  Administered 2020-04-29: 12 mg via ORAL
  Filled 2020-04-28 (×3): qty 1

## 2020-04-28 MED ORDER — ACETAMINOPHEN 500 MG PO TABS
1000.0000 mg | ORAL_TABLET | ORAL | Status: AC
  Administered 2020-04-29: 1000 mg via ORAL
  Filled 2020-04-28: qty 2

## 2020-04-28 MED ORDER — LIP MEDEX EX OINT
TOPICAL_OINTMENT | CUTANEOUS | Status: AC
  Filled 2020-04-28: qty 7

## 2020-04-28 MED ORDER — CELECOXIB 200 MG PO CAPS
200.0000 mg | ORAL_CAPSULE | ORAL | Status: AC
  Administered 2020-04-29: 200 mg via ORAL
  Filled 2020-04-28 (×2): qty 1

## 2020-04-28 MED ORDER — POLYETHYLENE GLYCOL 3350 17 GM/SCOOP PO POWD
1.0000 | Freq: Once | ORAL | Status: DC
  Filled 2020-04-28: qty 255

## 2020-04-28 MED ORDER — BUPIVACAINE LIPOSOME 1.3 % IJ SUSP
20.0000 mL | Freq: Once | INTRAMUSCULAR | Status: DC
  Filled 2020-04-28 (×2): qty 20

## 2020-04-28 MED ORDER — LIP MEDEX EX OINT: TOPICAL_OINTMENT | CUTANEOUS | Status: DC | PRN

## 2020-04-28 MED ORDER — BISACODYL 5 MG PO TBEC: 20.0000 mg | DELAYED_RELEASE_TABLET | Freq: Once | ORAL | Status: DC

## 2020-04-28 NOTE — Progress Notes (Signed)
Budeson -glycopyrrol Aero 1 puff not given due to its an home medication that pt does not have here.

## 2020-04-28 NOTE — Progress Notes (Addendum)
[facility] Progress Note     Subjective: Patient reports she feels about the same as yesterday. No further BM but passing flatus. Some nausea but no emesis.   Objective: Vital signs in last 24 hours: Temp:  [97.7 F (36.5 C)-98.2 F (36.8 C)] 97.8 F (36.6 C) (05/26 0843) Pulse Rate:  [65-91] 65 (05/26 0843) Resp:  [15-18] 18 (05/26 0843) BP: (88-121)/(57-77) 90/60 (05/26 0843) SpO2:  [95 %-99 %] 95 % (05/26 0843) Last BM Date: 04/27/20  Intake/Output from previous day: 05/25 0701 - 05/26 0700 In: 1241.6 [P.O.:240; I.V.:948.1; IV Piggyback:53.5] Out: 0  Intake/Output this shift: No intake/output data recorded.  PE: General: pleasant, WD, WN white female who is laying in bed in NAD HEENT: Sclera are noninjected.  PERRL.  Ears and nose without any masses or lesions.  Mouth is pink and moist Heart: regular, rate, and rhythm.  Normal s1,s2. No obvious murmurs, gallops, or rubs noted.  Palpable radial and pedal pulses bilaterally Lungs: CTAB, no wheezes, rhonchi, or rales noted.  Respiratory effort nonlabored Abd: soft, less ttp in LLQ, ND, +BS, drain in LLQ to gravity with feculent appearing material in bag MS: all 4 extremities are symmetrical with no cyanosis, clubbing, or edema. Skin: warm and dry with no masses, lesions, or rashes Neuro: Cranial nerves 2-12 grossly intact, sensation grossly intact throughout Psych: A&Ox3 with an appropriate affect.    Lab Results:  Recent Labs    04/27/20 1628 04/28/20 0522  WBC 11.9* 7.1  HGB 12.6 10.6*  HCT 40.7 34.9*  PLT 251 198   BMET Recent Labs    04/27/20 0159 04/27/20 0159 04/27/20 1153 04/28/20 0522  NA 138  --   --  136  K 4.0  --   --  4.2  CL 109  --   --  109  CO2 22  --   --  22  GLUCOSE 87  --   --  112*  BUN 13  --   --  9  CREATININE 0.73   < > 0.49 0.72  CALCIUM 8.5*  --   --  8.1*   < > = values in this interval not displayed.   PT/INR No results for input(s): LABPROT, INR in the last  72 hours. CMP     Component Value Date/Time   NA 136 04/28/2020 0522   NA 145 06/29/2014 2116   K 4.2 04/28/2020 0522   K 3.5 06/29/2014 2116   CL 109 04/28/2020 0522   CL 114 (H) 06/29/2014 2116   CO2 22 04/28/2020 0522   CO2 23 06/29/2014 2116   GLUCOSE 112 (H) 04/28/2020 0522   GLUCOSE 78 06/29/2014 2116   BUN 9 04/28/2020 0522   BUN 14 06/29/2014 2116   CREATININE 0.72 04/28/2020 0522   CREATININE 0.62 06/29/2014 2116   CALCIUM 8.1 (L) 04/28/2020 0522   CALCIUM 8.5 06/29/2014 2116   PROT 7.0 04/27/2020 0159   PROT 6.9 10/27/2013 1657   ALBUMIN 3.8 04/27/2020 0159   ALBUMIN 3.7 10/27/2013 1657   AST 13 (L) 04/27/2020 0159   AST 21 10/27/2013 1657   ALT 11 04/27/2020 0159   ALT 15 10/27/2013 1657   ALKPHOS 84 04/27/2020 0159   ALKPHOS 116 10/27/2013 1657   BILITOT 0.6 04/27/2020 0159   BILITOT 0.3 10/27/2013 1657   GFRNONAA >60 04/28/2020 0522   GFRNONAA >60 06/29/2014 2116   GFRAA >60 04/28/2020 0522   GFRAA >60 06/29/2014 2116   Lipase  Component Value Date/Time   LIPASE 36 04/27/2020 0159   LIPASE 44 (L) 10/05/2013 0016       Studies/Results: CT ABDOMEN PELVIS W CONTRAST  Result Date: 04/27/2020 CLINICAL DATA:  54 year old female with history of left lower quadrant abdominal pain. Recent history of diverticulitis. EXAM: CT ABDOMEN AND PELVIS WITH CONTRAST TECHNIQUE: Multidetector CT imaging of the abdomen and pelvis was performed using the standard protocol following bolus administration of intravenous contrast. CONTRAST:  182mL OMNIPAQUE IOHEXOL 300 MG/ML  SOLN COMPARISON:  CT the abdomen and pelvis 04/21/2020. FINDINGS: Lower chest: Small pulmonary nodules are noted in the lungs bilaterally, largest of which measures 5 mm in right lower lobe (axial image 11 of series 6), unchanged. Increasing subsegmental atelectasis in the right lower lobe. Hepatobiliary: No suspicious cystic or solid hepatic lesions. No intra or extrahepatic biliary ductal dilatation.  Gallbladder is normal in appearance. Pancreas: No pancreatic mass. No pancreatic ductal dilatation. No pancreatic or peripancreatic fluid collections or inflammatory changes. Spleen: Unremarkable. Adrenals/Urinary Tract: Bilateral kidneys and adrenal glands are normal in appearance. No hydroureteronephrosis. Urinary bladder is normal in appearance. Stomach/Bowel: Normal appearance of the stomach. No pathologic dilatation of small bowel or colon. Numerous colonic diverticuli are noted. Inflammatory changes are again evident adjacent to the descending colon. One of the previously noted left-sided percutaneous drainage catheters has been removed. The other pigtail drainage catheter remains in position adjacent to the descending colon. The collection around the catheter appears completely decompressed, although there is a small fistulous tract containing fluid and gas with rim enhancement which extends laterally coming in direct contact with the lateral abdominal wall musculature. This is best appreciated on axial image 34 of series 2 and coronal image 56 of series 4 measuring approximately 1.2 x 3.9 x 1.2 cm. Vascular/Lymphatic: No significant atherosclerotic disease, aneurysm or dissection noted in the abdominal or pelvic vasculature. No lymphadenopathy noted in the abdomen or pelvis. Reproductive: Uterus and ovaries are unremarkable in appearance. Other: No significant volume of ascites.  No pneumoperitoneum. Musculoskeletal: There are no aggressive appearing lytic or blastic lesions noted in the visualized portions of the skeleton. IMPRESSION: 1. Persistent evidence of diverticulitis in the descending colon. One of the drainage catheters has been removed. The other drainage catheter appears within a decompressed cavity, however, there is a fistulous tract extending laterally from the cavity tracking underneath the lateral abdominal wall musculature, as detailed above. Persistent surrounding inflammatory changes. 2.  Increasing subsegmental atelectasis in the right lower lobe. 3. Small pulmonary nodules in the lung bases bilaterally measuring 5 mm or less in size, stable compared to the prior examination. No follow-up needed if patient is low-risk (and has no known or suspected primary neoplasm). Non-contrast chest CT can be considered in 12 months if patient is high-risk. This recommendation follows the consensus statement: Guidelines for Management of Incidental Pulmonary Nodules Detected on CT Images: From the Fleischner Society 2017; Radiology 2017; 284:228-243. Electronically Signed   By: Vinnie Langton M.D.   On: 04/27/2020 10:08    Anti-infectives: Anti-infectives (From admission, onward)   Start     Dose/Rate Route Frequency Ordered Stop   04/27/20 1230  piperacillin-tazobactam (ZOSYN) IVPB 3.375 g     3.375 g 12.5 mL/hr over 240 Minutes Intravenous Every 8 hours 04/27/20 1139         Assessment/Plan HTN GERD Anxiety Hx Etoh abuse RLL nodule - recommend follow up CT 1 year  Recurrent diverticulitis with fistula              -  CT 5/1 showed evidence of acute diverticulitis of the mid to distal descending colon with suggestion of a small associated diverticular abscess measuring 1.1 x 1.8 cm             - IR drains x 2 placed 5/7             - one of IR drains was removed last week - CT today shows persistent diverticulitis, fistulous tract and persistent surrounding inflammatory changes - WBC 7 from 11, pt afeb, less ttp in LLQ, drain with feculent material  - advance to CLD - continue IV abx and drain - if pt improves and able to go home without surgery this admission, will likely need PO abx and drain until she has surgery. If she does not improve, will likely need Hartmann's   ID -zosyn 5/25>> FEN -CLD VTE -SCDs, lovenox Foley -none  LOS: 1 day    Norm Parcel , [facility] 04/28/2020, 8:51 AM Please see Amion for pager number during day hours  7:00am-4:30pm  Agree with above. Husband, Hoy Morn, at bedside.  He had colectomy for diverticulitis in 2015 and has done very well since then. Looks better than yesterday.  Less abdominal pain.  WBC down. To try clear liquids.  Alphonsa Overall, MD, [facility] Surgery Office phone:  [phone]

## 2020-04-28 NOTE — Progress Notes (Signed)
Referring Physician(s): Newman,D  Supervising Physician: Sandi Mariscal  Patient Status:  [facility] - In-pt  Chief Complaint:  Abdominal pain/abscess  Subjective: Pt feeling a little better this am; still a little sore LLQ at drain site; feculent output persists; occ nausea   Allergies: Codeine and Hydrocodone  Medications: Prior to Admission medications   Medication Sig Start Date End Date Taking? Authorizing Provider  acetaminophen (TYLENOL) 325 MG tablet Take 2 tablets (650 mg total) by mouth every 6 (six) hours as needed for mild pain (or temp > 100). 04/12/20  Yes Meuth, Brooke A, PA-C  Acetylcysteine (N-ACETYL-L-CYSTEINE) 600 MG CAPS Take 600 mg by mouth in the morning, at noon, and at bedtime.   Yes [provider]  ADZENYS XR-ODT 12.5 MG TBED Take 25 mg by mouth daily.  11/28/18  Yes [provider]  amoxicillin-clavulanate (AUGMENTIN) 875-125 MG tablet Take 1 tablet by mouth 2 (two) times daily. 04/26/20  Yes [provider]  BREZTRI AEROSPHERE 160-9-4.8 MCG/ACT AERO Inhale 1 puff into the lungs in the morning and at bedtime. 03/24/20  Yes [provider]  budesonide (PULMICORT) 0.5 MG/2ML nebulizer solution Take 2 mLs by nebulization daily. 03/17/20  Yes [provider]  cloNIDine (CATAPRES) 0.1 MG tablet Take 0.1 mg by mouth 4 (four) times daily.   Yes [provider]  FLUoxetine HCl 60 MG TABS Take 60 mg by mouth daily. 08/25/19  Yes [provider]  fluticasone (FLONASE) 50 MCG/ACT nasal spray Place 2 sprays into the nose daily as needed for allergies or rhinitis.  01/28/19  Yes [provider]  gabapentin (NEURONTIN) 300 MG capsule Take 300-900 mg by mouth See admin instructions. Take 1 capsule (300mg ) by mouth three times daily and 3 capsules (900mg ) by mouth at bedtime.   Yes [provider]  hydrOXYzine (VISTARIL) 25 MG capsule Take 25 mg by mouth 2 (two) times daily as needed for anxiety. 08/07/19   Yes [provider]  ipratropium-albuterol (DUONEB) 0.5-2.5 (3) MG/3ML SOLN Take 3 mLs by nebulization every 6 (six) hours as needed. Patient taking differently: Take 3 mLs by nebulization every 6 (six) hours as needed (shortness of breath/wheezing).  09/10/19  Yes Tylene Fantasia, PA-C  Multiple Vitamin (MULTIVITAMIN WITH MINERALS) TABS tablet Take 1 tablet by mouth daily. 04/13/20  Yes Meuth, Blaine Hamper, PA-C  PROAIR HFA 108 (90 Base) MCG/ACT inhaler Inhale 2 puffs into the lungs every 4 (four) hours as needed for wheezing or shortness of breath.  11/24/18  Yes [provider]  progesterone (PROMETRIUM) 100 MG capsule Take 200 mg by mouth daily.  08/25/19  Yes [provider]  SUMAtriptan (IMITREX) 100 MG tablet Take 100 mg by mouth daily as needed for migraine. 03/09/20  Yes [provider]  topiramate (TOPAMAX) 50 MG tablet Take 50 mg by mouth daily. 03/08/20  Yes [provider]  traZODone (DESYREL) 100 MG tablet Take 100 mg by mouth at bedtime as needed for sleep.    Yes [provider]  Turmeric 500 MG CAPS Take 500 mg by mouth daily.   Yes [provider]  Vitamin D, Ergocalciferol, (DRISDOL) 1.25 MG (50000 UNIT) CAPS capsule Take 50,000 Units by mouth once a week. 03/18/20  Yes [provider]     Vital Signs: BP (!) 93/49   Pulse 67   Temp 97.8 F (36.6 C) (Oral)   Resp 18   Ht 5\' 5"  (1.651 m)   Wt 145 lb (65.8 kg)  SpO2 95%   BMI 24.13 kg/m   Physical Exam awake/alert; LLQ drain intact, insertion site ok, mildly tender, OP minimal amt feculent fluid in tubing  Imaging: CT ABDOMEN PELVIS W CONTRAST  Result Date: 04/27/2020 CLINICAL DATA:  54 year old female with history of left lower quadrant abdominal pain. Recent history of diverticulitis. EXAM: CT ABDOMEN AND PELVIS WITH CONTRAST TECHNIQUE: Multidetector CT imaging of the abdomen and pelvis was performed using the standard protocol following bolus  administration of intravenous contrast. CONTRAST:  136mL OMNIPAQUE IOHEXOL 300 MG/ML  SOLN COMPARISON:  CT the abdomen and pelvis 04/21/2020. FINDINGS: Lower chest: Small pulmonary nodules are noted in the lungs bilaterally, largest of which measures 5 mm in right lower lobe (axial image 11 of series 6), unchanged. Increasing subsegmental atelectasis in the right lower lobe. Hepatobiliary: No suspicious cystic or solid hepatic lesions. No intra or extrahepatic biliary ductal dilatation. Gallbladder is normal in appearance. Pancreas: No pancreatic mass. No pancreatic ductal dilatation. No pancreatic or peripancreatic fluid collections or inflammatory changes. Spleen: Unremarkable. Adrenals/Urinary Tract: Bilateral kidneys and adrenal glands are normal in appearance. No hydroureteronephrosis. Urinary bladder is normal in appearance. Stomach/Bowel: Normal appearance of the stomach. No pathologic dilatation of small bowel or colon. Numerous colonic diverticuli are noted. Inflammatory changes are again evident adjacent to the descending colon. One of the previously noted left-sided percutaneous drainage catheters has been removed. The other pigtail drainage catheter remains in position adjacent to the descending colon. The collection around the catheter appears completely decompressed, although there is a small fistulous tract containing fluid and gas with rim enhancement which extends laterally coming in direct contact with the lateral abdominal wall musculature. This is best appreciated on axial image 34 of series 2 and coronal image 56 of series 4 measuring approximately 1.2 x 3.9 x 1.2 cm. Vascular/Lymphatic: No significant atherosclerotic disease, aneurysm or dissection noted in the abdominal or pelvic vasculature. No lymphadenopathy noted in the abdomen or pelvis. Reproductive: Uterus and ovaries are unremarkable in appearance. Other: No significant volume of ascites.  No pneumoperitoneum. Musculoskeletal: There  are no aggressive appearing lytic or blastic lesions noted in the visualized portions of the skeleton. IMPRESSION: 1. Persistent evidence of diverticulitis in the descending colon. One of the drainage catheters has been removed. The other drainage catheter appears within a decompressed cavity, however, there is a fistulous tract extending laterally from the cavity tracking underneath the lateral abdominal wall musculature, as detailed above. Persistent surrounding inflammatory changes. 2. Increasing subsegmental atelectasis in the right lower lobe. 3. Small pulmonary nodules in the lung bases bilaterally measuring 5 mm or less in size, stable compared to the prior examination. No follow-up needed if patient is low-risk (and has no known or suspected primary neoplasm). Non-contrast chest CT can be considered in 12 months if patient is high-risk. This recommendation follows the consensus statement: Guidelines for Management of Incidental Pulmonary Nodules Detected on CT Images: From the Fleischner Society 2017; Radiology 2017; 284:228-243. Electronically Signed   By: Vinnie Langton M.D.   On: 04/27/2020 10:08    Labs:  CBC: Recent Labs    04/11/20 0429 04/27/20 0159 04/27/20 1628 04/28/20 0522  WBC 6.7 10.6* 11.9* 7.1  HGB 10.0* 12.6 12.6 10.6*  HCT 33.5* 40.6 40.7 34.9*  PLT 231 281 251 198    COAGS: Recent Labs    04/09/20 0921  INR 1.1    BMP: Recent Labs    04/07/20 0303 04/07/20 0303 04/08/20 0252 04/08/20 0252 04/10/20 0535 04/27/20 0159 04/27/20 1153  04/28/20 0522  NA 139  --  141  --   --  138  --  136  K 4.2  --  4.6  --   --  4.0  --  4.2  CL 109  --  107  --   --  109  --  109  CO2 25  --  26  --   --  22  --  22  GLUCOSE 103*  --  103*  --   --  87  --  112*  BUN 6  --  5*  --   --  13  --  9  CALCIUM 8.2*  --  8.4*  --   --  8.5*  --  8.1*  CREATININE 0.54   < > 0.73   < > 0.60 0.73 0.49 0.72  GFRNONAA >60   < > >60   < > >60 >60 >60 >60  GFRAA >60   < > >60    < > >60 >60 >60 >60   < > = values in this interval not displayed.    LIVER FUNCTION TESTS: Recent Labs    09/06/19 1325 09/07/19 0614 04/03/20 0600 04/27/20 0159  BILITOT 1.4* 1.2 0.4 0.6  AST 14* 10* 16 13*  ALT 13 11 16 11   ALKPHOS 107 75 84 84  PROT 7.6 5.8* 6.3* 7.0  ALBUMIN 4.3 3.3* 3.6 3.8    Assessment and Plan: Patient with history of diverticulitis with associated abscesses, status post left abdominal drain placement x2 on 04/09/2020; removal of ant drain 04/21/20; posterior diverticular abscess drain with fistula;afebrile; BP a little soft- hydrate; WBC nl; hgb 10.6, creat nl; ;latest CT was reviewed by Dr. Laurence Ferrari; cont current tx ; no drain flushes as fistula present; pt scheduled for f/u drain injection at IR clinic on 6/15; other plans as per [facility]    Electronically Signed: D. Rowe Robert, PA-C 04/28/2020, 11:05 AM   I spent a total of 15 minutes at the the patient's bedside AND on the patient's hospital floor or unit, greater than 50% of which was counseling/coordinating care for abdominal abscess drain    Patient ID: [name], female   DOB: 1966/11/11, 54 y.o.   MRN: [id number]

## 2020-04-29 LAB — BASIC METABOLIC PANEL
Anion gap: 6 (ref 5–15)
BUN: <5 mg/dL — ABNORMAL LOW (ref 6–20)
CO2: 23 mmol/L (ref 22–32)
Calcium: 8.2 mg/dL — ABNORMAL LOW (ref 8.9–10.3)
Chloride: 109 mmol/L (ref 98–111)
Creatinine, Ser: 0.68 mg/dL (ref 0.44–1.00)
GFR calc Af Amer: >60 mL/min (ref >60)
GFR calc non Af Amer: >60 mL/min (ref >60)
Glucose, Bld: 105 mg/dL — ABNORMAL HIGH (ref 70–99)
Potassium: 4.4 mmol/L (ref 3.5–5.1)
Sodium: 138 mmol/L (ref 135–145)

## 2020-04-29 LAB — CBC
HCT: 35.3 % — ABNORMAL LOW (ref 36.0–46.0)
Hemoglobin: 10.3 g/dL — ABNORMAL LOW (ref 12.0–15.0)
MCH: 28.5 pg (ref 26.0–34.0)
MCHC: 29.2 g/dL — ABNORMAL LOW (ref 30.0–36.0)
MCV: 97.5 fL (ref 80.0–100.0)
Platelets: 183 10*3/uL (ref 150–400)
RBC: 3.62 MIL/uL — ABNORMAL LOW (ref 3.87–5.11)
RDW: 13.8 % (ref 11.5–15.5)
WBC: 8.2 10*3/uL (ref 4.0–10.5)
nRBC: 0 % (ref 0.0–0.2)

## 2020-04-29 LAB — HEMOGLOBIN A1C
Hgb A1c MFr Bld: 5.6 % (ref 4.8–5.6)
Mean Plasma Glucose: 114.02 mg/dL

## 2020-04-29 NOTE — Progress Notes (Addendum)
[facility] Progress Note     Subjective: Patient tolerating CLD. +flatus but no BM. No nausea this AM. Pain is stable.   Objective: Vital signs in last 24 hours: Temp:  [97.8 F (36.6 C)-98.7 F (37.1 C)] 98.7 F (37.1 C) (05/27 0619) Pulse Rate:  [65-80] 75 (05/27 0619) Resp:  [17-18] 17 (05/27 0619) BP: (90-108)/(49-72) 107/66 (05/27 0619) SpO2:  [95 %-96 %] 96 % (05/27 0619) Last BM Date: 04/27/20  Intake/Output from previous day: 05/26 0701 - 05/27 0700 In: 1282 [P.O.:510; I.V.:713.6; IV Piggyback:58.4] Out: -  Intake/Output this shift: No intake/output data recorded.  PE: General: pleasant, WD, WN whitefemale who is laying in bed in NAD HEENT: Sclera are noninjected. PERRL. Ears and nose without any masses or lesions. Mouth is pink and moist Heart: regular, rate, and rhythm. Normal s1,s2. No obvious murmurs, gallops, or rubs noted. Palpable radial and pedal pulses bilaterally Lungs: CTAB, no wheezes, rhonchi, or rales noted. Respiratory effort nonlabored Abd: soft,less ttp in LLQ, ND, +BS,drain in LLQ to gravity with feculent appearing material in bag MS: all 4 extremities are symmetrical with no cyanosis, clubbing, or edema. Skin: warm and dry with no masses, lesions, or rashes Neuro: Cranial nerves 2-12 grossly intact, sensation grossly intact throughout Psych: A&Ox3 with an appropriate affect.   Lab Results:  Recent Labs    04/28/20 0522 04/29/20 0611  WBC 7.1 8.2  HGB 10.6* 10.3*  HCT 34.9* 35.3*  PLT 198 183   BMET Recent Labs    04/28/20 0522 04/29/20 0611  NA 136 138  K 4.2 4.4  CL 109 109  CO2 22 23  GLUCOSE 112* 105*  BUN 9 <5*  CREATININE 0.72 0.68  CALCIUM 8.1* 8.2*   PT/INR No results for input(s): LABPROT, INR in the last 72 hours. CMP     Component Value Date/Time   NA 138 04/29/2020 0611   NA 145 06/29/2014 2116   K 4.4 04/29/2020 0611   K 3.5 06/29/2014 2116   CL 109 04/29/2020 0611   CL 114 (H)  06/29/2014 2116   CO2 23 04/29/2020 0611   CO2 23 06/29/2014 2116   GLUCOSE 105 (H) 04/29/2020 0611   GLUCOSE 78 06/29/2014 2116   BUN <5 (L) 04/29/2020 0611   BUN 14 06/29/2014 2116   CREATININE 0.68 04/29/2020 0611   CREATININE 0.62 06/29/2014 2116   CALCIUM 8.2 (L) 04/29/2020 0611   CALCIUM 8.5 06/29/2014 2116   PROT 7.0 04/27/2020 0159   PROT 6.9 10/27/2013 1657   ALBUMIN 3.8 04/27/2020 0159   ALBUMIN 3.7 10/27/2013 1657   AST 13 (L) 04/27/2020 0159   AST 21 10/27/2013 1657   ALT 11 04/27/2020 0159   ALT 15 10/27/2013 1657   ALKPHOS 84 04/27/2020 0159   ALKPHOS 116 10/27/2013 1657   BILITOT 0.6 04/27/2020 0159   BILITOT 0.3 10/27/2013 1657   GFRNONAA >60 04/29/2020 0611   GFRNONAA >60 06/29/2014 2116   GFRAA >60 04/29/2020 0611   GFRAA >60 06/29/2014 2116   Lipase     Component Value Date/Time   LIPASE 36 04/27/2020 0159   LIPASE 44 (L) 10/05/2013 0016       Studies/Results: CT ABDOMEN PELVIS W CONTRAST  Result Date: 04/27/2020 CLINICAL DATA:  54 year old female with history of left lower quadrant abdominal pain. Recent history of diverticulitis. EXAM: CT ABDOMEN AND PELVIS WITH CONTRAST TECHNIQUE: Multidetector CT imaging of the abdomen and pelvis was performed using the standard protocol following bolus administration of intravenous  contrast. CONTRAST:  17mL OMNIPAQUE IOHEXOL 300 MG/ML  SOLN COMPARISON:  CT the abdomen and pelvis 04/21/2020. FINDINGS: Lower chest: Small pulmonary nodules are noted in the lungs bilaterally, largest of which measures 5 mm in right lower lobe (axial image 11 of series 6), unchanged. Increasing subsegmental atelectasis in the right lower lobe. Hepatobiliary: No suspicious cystic or solid hepatic lesions. No intra or extrahepatic biliary ductal dilatation. Gallbladder is normal in appearance. Pancreas: No pancreatic mass. No pancreatic ductal dilatation. No pancreatic or peripancreatic fluid collections or inflammatory changes. Spleen:  Unremarkable. Adrenals/Urinary Tract: Bilateral kidneys and adrenal glands are normal in appearance. No hydroureteronephrosis. Urinary bladder is normal in appearance. Stomach/Bowel: Normal appearance of the stomach. No pathologic dilatation of small bowel or colon. Numerous colonic diverticuli are noted. Inflammatory changes are again evident adjacent to the descending colon. One of the previously noted left-sided percutaneous drainage catheters has been removed. The other pigtail drainage catheter remains in position adjacent to the descending colon. The collection around the catheter appears completely decompressed, although there is a small fistulous tract containing fluid and gas with rim enhancement which extends laterally coming in direct contact with the lateral abdominal wall musculature. This is best appreciated on axial image 34 of series 2 and coronal image 56 of series 4 measuring approximately 1.2 x 3.9 x 1.2 cm. Vascular/Lymphatic: No significant atherosclerotic disease, aneurysm or dissection noted in the abdominal or pelvic vasculature. No lymphadenopathy noted in the abdomen or pelvis. Reproductive: Uterus and ovaries are unremarkable in appearance. Other: No significant volume of ascites.  No pneumoperitoneum. Musculoskeletal: There are no aggressive appearing lytic or blastic lesions noted in the visualized portions of the skeleton. IMPRESSION: 1. Persistent evidence of diverticulitis in the descending colon. One of the drainage catheters has been removed. The other drainage catheter appears within a decompressed cavity, however, there is a fistulous tract extending laterally from the cavity tracking underneath the lateral abdominal wall musculature, as detailed above. Persistent surrounding inflammatory changes. 2. Increasing subsegmental atelectasis in the right lower lobe. 3. Small pulmonary nodules in the lung bases bilaterally measuring 5 mm or less in size, stable compared to the prior  examination. No follow-up needed if patient is low-risk (and has no known or suspected primary neoplasm). Non-contrast chest CT can be considered in 12 months if patient is high-risk. This recommendation follows the consensus statement: Guidelines for Management of Incidental Pulmonary Nodules Detected on CT Images: From the Fleischner Society 2017; Radiology 2017; 284:228-243. Electronically Signed   By: Vinnie Langton M.D.   On: 04/27/2020 10:08    Anti-infectives: Anti-infectives (From admission, onward)   Start     Dose/Rate Route Frequency Ordered Stop   04/27/20 1230  piperacillin-tazobactam (ZOSYN) IVPB 3.375 g     3.375 g 12.5 mL/hr over 240 Minutes Intravenous Every 8 hours 04/27/20 1139         Assessment/PlanHTN GERD Anxiety Hx Etoh abuse RLL nodule - recommend follow up CT 1 year  Recurrent diverticulitis with fistula -CT 5/1 showed evidence of acute diverticulitis of the mid to distal descending colon with suggestion of a small associated diverticular abscess measuring 1.1 x 1.8 cm - IR drains x 2 placed 5/7 - one of IR drains was removed last week - CT today shows persistent diverticulitis, fistulous tract and persistent surrounding inflammatory changes - WBC 8, pt afeb, less ttp in LLQ, drain with feculent material - advance to FLD - continue IV abx and drain - if pt improves and able to go  home without surgery this admission, will likely need PO abx and drain until she has surgery. If she does not improve, will likely need Hartmann's   ID -zosyn 5/25>> FEN -FLD VTE -SCDs, lovenox Foley -none  LOS: 2 days    Norm Parcel , [facility] 04/29/2020, 8:06 AM Please see Amion for pager number during day hours 7:00am-4:30pm  Agree with above.  Alphonsa Overall, MD, [facility] Office phone:  [phone]

## 2020-04-29 NOTE — Progress Notes (Signed)
Referring Physician(s): Dr. Lucia Gaskins, Surgery   Supervising Physician: Sandi Mariscal  Patient Status:  [facility] - In-pt  Chief Complaint: Abdominal pain/abscess  Subjective: Patient states her pain is decreased but she is having some nausea with vomiting this morning. There has been no drain output since admission.   Allergies: Codeine and Hydrocodone  Medications: Prior to Admission medications   Medication Sig Start Date End Date Taking? Authorizing Provider  acetaminophen (TYLENOL) 325 MG tablet Take 2 tablets (650 mg total) by mouth every 6 (six) hours as needed for mild pain (or temp > 100). 04/12/20  Yes Meuth, Brooke A, PA-C  Acetylcysteine (N-ACETYL-L-CYSTEINE) 600 MG CAPS Take 600 mg by mouth in the morning, at noon, and at bedtime.   Yes [provider]  ADZENYS XR-ODT 12.5 MG TBED Take 25 mg by mouth daily.  11/28/18  Yes [provider]  amoxicillin-clavulanate (AUGMENTIN) 875-125 MG tablet Take 1 tablet by mouth 2 (two) times daily. 04/26/20  Yes [provider]  BREZTRI AEROSPHERE 160-9-4.8 MCG/ACT AERO Inhale 1 puff into the lungs in the morning and at bedtime. 03/24/20  Yes [provider]  budesonide (PULMICORT) 0.5 MG/2ML nebulizer solution Take 2 mLs by nebulization daily. 03/17/20  Yes [provider]  cloNIDine (CATAPRES) 0.1 MG tablet Take 0.1 mg by mouth 4 (four) times daily.   Yes [provider]  FLUoxetine HCl 60 MG TABS Take 60 mg by mouth daily. 08/25/19  Yes [provider]  fluticasone (FLONASE) 50 MCG/ACT nasal spray Place 2 sprays into the nose daily as needed for allergies or rhinitis.  01/28/19  Yes [provider]  gabapentin (NEURONTIN) 300 MG capsule Take 300-900 mg by mouth See admin instructions. Take 1 capsule (300mg ) by mouth three times daily and 3 capsules (900mg ) by mouth at bedtime.   Yes [provider]  hydrOXYzine (VISTARIL) 25 MG capsule Take 25 mg by mouth 2 (two) times  daily as needed for anxiety. 08/07/19  Yes [provider]  ipratropium-albuterol (DUONEB) 0.5-2.5 (3) MG/3ML SOLN Take 3 mLs by nebulization every 6 (six) hours as needed. Patient taking differently: Take 3 mLs by nebulization every 6 (six) hours as needed (shortness of breath/wheezing).  09/10/19  Yes Tylene Fantasia, PA-C  Multiple Vitamin (MULTIVITAMIN WITH MINERALS) TABS tablet Take 1 tablet by mouth daily. 04/13/20  Yes Meuth, Blaine Hamper, PA-C  PROAIR HFA 108 (90 Base) MCG/ACT inhaler Inhale 2 puffs into the lungs every 4 (four) hours as needed for wheezing or shortness of breath.  11/24/18  Yes [provider]  progesterone (PROMETRIUM) 100 MG capsule Take 200 mg by mouth daily.  08/25/19  Yes [provider]  SUMAtriptan (IMITREX) 100 MG tablet Take 100 mg by mouth daily as needed for migraine. 03/09/20  Yes [provider]  topiramate (TOPAMAX) 50 MG tablet Take 50 mg by mouth daily. 03/08/20  Yes [provider]  traZODone (DESYREL) 100 MG tablet Take 100 mg by mouth at bedtime as needed for sleep.    Yes [provider]  Turmeric 500 MG CAPS Take 500 mg by mouth daily.   Yes [provider]  Vitamin D, Ergocalciferol, (DRISDOL) 1.25 MG (50000 UNIT) CAPS capsule Take 50,000 Units by mouth once a week. 03/18/20  Yes [provider]     Vital Signs: BP (!) 99/52 (BP Location: Right Arm)   Pulse 76   Temp 98.4 F (36.9 C) (Oral)   Resp 17   Ht 5'  5" (1.651 m)   Wt 145 lb (65.8 kg)   SpO2 97%   BMI 24.13 kg/m   Physical Exam Constitutional:      General: She is not in acute distress. Pulmonary:     Effort: Pulmonary effort is normal.  Abdominal:     Palpations: Abdomen is soft.     Tenderness: There is abdominal tenderness in the left lower quadrant.  Skin:    General: Skin is warm and dry.  Neurological:     Mental Status: She is alert and oriented to person, place, and time.     Imaging: CT ABDOMEN PELVIS  W CONTRAST  Result Date: 04/27/2020 CLINICAL DATA:  54 year old female with history of left lower quadrant abdominal pain. Recent history of diverticulitis. EXAM: CT ABDOMEN AND PELVIS WITH CONTRAST TECHNIQUE: Multidetector CT imaging of the abdomen and pelvis was performed using the standard protocol following bolus administration of intravenous contrast. CONTRAST:  155mL OMNIPAQUE IOHEXOL 300 MG/ML  SOLN COMPARISON:  CT the abdomen and pelvis 04/21/2020. FINDINGS: Lower chest: Small pulmonary nodules are noted in the lungs bilaterally, largest of which measures 5 mm in right lower lobe (axial image 11 of series 6), unchanged. Increasing subsegmental atelectasis in the right lower lobe. Hepatobiliary: No suspicious cystic or solid hepatic lesions. No intra or extrahepatic biliary ductal dilatation. Gallbladder is normal in appearance. Pancreas: No pancreatic mass. No pancreatic ductal dilatation. No pancreatic or peripancreatic fluid collections or inflammatory changes. Spleen: Unremarkable. Adrenals/Urinary Tract: Bilateral kidneys and adrenal glands are normal in appearance. No hydroureteronephrosis. Urinary bladder is normal in appearance. Stomach/Bowel: Normal appearance of the stomach. No pathologic dilatation of small bowel or colon. Numerous colonic diverticuli are noted. Inflammatory changes are again evident adjacent to the descending colon. One of the previously noted left-sided percutaneous drainage catheters has been removed. The other pigtail drainage catheter remains in position adjacent to the descending colon. The collection around the catheter appears completely decompressed, although there is a small fistulous tract containing fluid and gas with rim enhancement which extends laterally coming in direct contact with the lateral abdominal wall musculature. This is best appreciated on axial image 34 of series 2 and coronal image 56 of series 4 measuring approximately 1.2 x 3.9 x 1.2 cm.  Vascular/Lymphatic: No significant atherosclerotic disease, aneurysm or dissection noted in the abdominal or pelvic vasculature. No lymphadenopathy noted in the abdomen or pelvis. Reproductive: Uterus and ovaries are unremarkable in appearance. Other: No significant volume of ascites.  No pneumoperitoneum. Musculoskeletal: There are no aggressive appearing lytic or blastic lesions noted in the visualized portions of the skeleton. IMPRESSION: 1. Persistent evidence of diverticulitis in the descending colon. One of the drainage catheters has been removed. The other drainage catheter appears within a decompressed cavity, however, there is a fistulous tract extending laterally from the cavity tracking underneath the lateral abdominal wall musculature, as detailed above. Persistent surrounding inflammatory changes. 2. Increasing subsegmental atelectasis in the right lower lobe. 3. Small pulmonary nodules in the lung bases bilaterally measuring 5 mm or less in size, stable compared to the prior examination. No follow-up needed if patient is low-risk (and has no known or suspected primary neoplasm). Non-contrast chest CT can be considered in 12 months if patient is high-risk. This recommendation follows the consensus statement: Guidelines for Management of Incidental Pulmonary Nodules Detected on CT Images: From the Fleischner Society 2017; Radiology 2017; 284:228-243. Electronically Signed   By: Vinnie Langton M.D.   On: 04/27/2020 10:08    Labs:  CBC: Recent Labs    04/27/20 0159 04/27/20 1628 04/28/20 0522 04/29/20 0611  WBC 10.6* 11.9* 7.1 8.2  HGB 12.6 12.6 10.6* 10.3*  HCT 40.6 40.7 34.9* 35.3*  PLT 281 251 198 183    COAGS: Recent Labs    04/09/20 0921  INR 1.1    BMP: Recent Labs    04/08/20 0252 04/10/20 0535 04/27/20 0159 04/27/20 1153 04/28/20 0522 04/29/20 0611  NA 141  --  138  --  136 138  K 4.6  --  4.0  --  4.2 4.4  CL 107  --  109  --  109 109  CO2 26  --  22  --  22  23  GLUCOSE 103*  --  87  --  112* 105*  BUN 5*  --  13  --  9 <5*  CALCIUM 8.4*  --  8.5*  --  8.1* 8.2*  CREATININE 0.73   < > 0.73 0.49 0.72 0.68  GFRNONAA >60   < > >60 >60 >60 >60  GFRAA >60   < > >60 >60 >60 >60   < > = values in this interval not displayed.    LIVER FUNCTION TESTS: Recent Labs    09/06/19 1325 09/07/19 0614 04/03/20 0600 04/27/20 0159  BILITOT 1.4* 1.2 0.4 0.6  AST 14* 10* 16 13*  ALT 13 11 16 11   ALKPHOS 107 75 84 84  PROT 7.6 5.8* 6.3* 7.0  ALBUMIN 4.3 3.3* 3.6 3.8    Assessment and Plan:  Patient with history of diverticulitis with associated abscesses. She is status post left abdominal drain placement x 2 on 04/09/2020. Anterior drain removed 04/21/20. Posterior diverticular abscess drain now with fistula. Patient is currently afebrile. CT from 5/25 reviewed by Dr. Laurence Ferrari; cont current tx ; no drain flushes as fistula present. Patient is scheduled for follow up drain injection at IR clinic on 6/15. Other plans as per [facility]. Patient c/o odor from current drain bag, this has now been replaced with a new one.   Electronically Signed: Theresa Duty, NP 04/29/2020, 2:31 PM   I spent a total of 15 Minutes at the the patient's bedside AND on the patient's hospital floor or unit, greater than 50% of which was counseling/coordinating care for abdominal abscess drain.

## 2020-04-29 NOTE — Progress Notes (Signed)
Pt stated she took her home breathing medication.

## 2020-04-30 ENCOUNTER — Other Ambulatory Visit: Payer: Self-pay | Admitting: Urology

## 2020-04-30 LAB — CBC
HCT: 34.9 % — ABNORMAL LOW (ref 36.0–46.0)
Hemoglobin: 10.6 g/dL — ABNORMAL LOW (ref 12.0–15.0)
MCH: 29.7 pg (ref 26.0–34.0)
MCHC: 30.4 g/dL (ref 30.0–36.0)
MCV: 97.8 fL (ref 80.0–100.0)
Platelets: 167 10*3/uL (ref 150–400)
RBC: 3.57 MIL/uL — ABNORMAL LOW (ref 3.87–5.11)
RDW: 13.6 % (ref 11.5–15.5)
WBC: 3.4 10*3/uL — ABNORMAL LOW (ref 4.0–10.5)
nRBC: 0 % (ref 0.0–0.2)

## 2020-04-30 LAB — BASIC METABOLIC PANEL
Anion gap: 9 (ref 5–15)
BUN: 5 mg/dL — ABNORMAL LOW (ref 6–20)
CO2: 21 mmol/L — ABNORMAL LOW (ref 22–32)
Calcium: 8.3 mg/dL — ABNORMAL LOW (ref 8.9–10.3)
Chloride: 110 mmol/L (ref 98–111)
Creatinine, Ser: 0.6 mg/dL (ref 0.44–1.00)
GFR calc Af Amer: >60 mL/min (ref >60)
GFR calc non Af Amer: >60 mL/min (ref >60)
Glucose, Bld: 102 mg/dL — ABNORMAL HIGH (ref 70–99)
Potassium: 4.2 mmol/L (ref 3.5–5.1)
Sodium: 140 mmol/L (ref 135–145)

## 2020-04-30 MED ORDER — SODIUM CHLORIDE 0.9% FLUSH: 3.0000 mL | INTRAVENOUS | Status: DC | PRN

## 2020-04-30 MED ORDER — SODIUM CHLORIDE 0.9 % IV BOLUS
500.0000 mL | Freq: Once | INTRAVENOUS | Status: AC
  Administered 2020-04-30: 500 mL via INTRAVENOUS

## 2020-04-30 MED ORDER — OXYCODONE HCL 10 MG PO TABS: 5.0000 mg | ORAL_TABLET | Freq: Four times a day (QID) | ORAL | 0 refills | Status: DC | PRN

## 2020-04-30 MED ORDER — CIPROFLOXACIN HCL 500 MG PO TABS: 500.0000 mg | ORAL_TABLET | Freq: Two times a day (BID) | ORAL | 0 refills | Status: AC

## 2020-04-30 MED ORDER — PANTOPRAZOLE SODIUM 40 MG PO TBEC
40.0000 mg | DELAYED_RELEASE_TABLET | Freq: Every day | ORAL | Status: DC
  Administered 2020-04-30 – 2020-05-01 (×2): 40 mg via ORAL
  Filled 2020-04-30 (×2): qty 1

## 2020-04-30 MED ORDER — ONDANSETRON 4 MG PO TBDP: 4.0000 mg | ORAL_TABLET | Freq: Four times a day (QID) | ORAL | 0 refills | Status: DC | PRN

## 2020-04-30 MED ORDER — SODIUM CHLORIDE 0.9% FLUSH
3.0000 mL | Freq: Two times a day (BID) | INTRAVENOUS | Status: DC
  Administered 2020-04-30: 3 mL via INTRAVENOUS

## 2020-04-30 MED ORDER — SODIUM CHLORIDE 0.9 % IV SOLN: 250.0000 mL | INTRAVENOUS | Status: DC | PRN

## 2020-04-30 MED ORDER — METRONIDAZOLE 500 MG PO TABS: 500.0000 mg | ORAL_TABLET | Freq: Three times a day (TID) | ORAL | 0 refills | Status: AC

## 2020-04-30 MED ORDER — METRONIDAZOLE 500 MG PO TABS
500.0000 mg | ORAL_TABLET | Freq: Three times a day (TID) | ORAL | Status: DC
  Administered 2020-04-30 – 2020-05-01 (×4): 500 mg via ORAL
  Filled 2020-04-30 (×4): qty 1

## 2020-04-30 MED ORDER — CIPROFLOXACIN HCL 500 MG PO TABS
500.0000 mg | ORAL_TABLET | Freq: Two times a day (BID) | ORAL | Status: DC
  Administered 2020-04-30 – 2020-05-01 (×3): 500 mg via ORAL
  Filled 2020-04-30 (×3): qty 1

## 2020-04-30 NOTE — Progress Notes (Signed)
RN agrees with previous Psychiatrist. Patient is In no acute distress. Rn will continue to monitor.

## 2020-04-30 NOTE — Progress Notes (Addendum)
[facility] Progress Note     Subjective:  Patient reports she still has some mild pain but overall is feeling better. She had some low BP overnight but was not symptomatic from this. She still has some intermittent nausea but feels like it is more from her stomach feeling empty. Emesis x1 yesterday AM but none since. Has had 2 BMs. Hopeful to go home tomorrow.   Objective: Vital signs in last 24 hours: Temp:  [97.6 F (36.4 C)-98.4 F (36.9 C)] 97.8 F (36.6 C) (05/28 0754) Pulse Rate:  [55-76] 55 (05/28 0754) Resp:  [16-18] 18 (05/28 0754) BP: (81-104)/(51-62) 84/53 (05/28 0754) SpO2:  [97 %-99 %] 99 % (05/28 0754) Last BM Date: 04/27/20  Intake/Output from previous day: 05/27 0701 - 05/28 0700 In: 480 [P.O.:480] Out: -  Intake/Output this shift: No intake/output data recorded.  PE: General: pleasant, WD, WN whitefemale who is laying in bed in NAD HEENT: Sclera are noninjected. PERRL. Ears and nose without any masses or lesions. Mouth is pink and moist Heart: regular, rate, and rhythm. Normal s1,s2. No obvious murmurs, gallops, or rubs noted. Palpable radial and pedal pulses bilaterally Lungs: CTAB, no wheezes, rhonchi, or rales noted. Respiratory effort nonlabored Abd: soft,mildly ttp in LLQ and suprapubic abd, ND, +BS,drain in LLQ without any drainage in bag MS: all 4 extremities are symmetrical with no cyanosis, clubbing, or edema. Skin: warm and dry with no masses, lesions, or rashes Neuro: Cranial nerves 2-12 grossly intact, sensation grossly intact throughout Psych: A&Ox3 with an appropriate affect.   Lab Results:  Recent Labs    04/29/20 0611 04/30/20 0820  WBC 8.2 3.4*  HGB 10.3* 10.6*  HCT 35.3* 34.9*  PLT 183 167   BMET Recent Labs    04/28/20 0522 04/29/20 0611  NA 136 138  K 4.2 4.4  CL 109 109  CO2 22 23  GLUCOSE 112* 105*  BUN 9 <5*  CREATININE 0.72 0.68  CALCIUM 8.1* 8.2*   PT/INR No results for input(s): LABPROT,  INR in the last 72 hours. CMP     Component Value Date/Time   NA 138 04/29/2020 0611   NA 145 06/29/2014 2116   K 4.4 04/29/2020 0611   K 3.5 06/29/2014 2116   CL 109 04/29/2020 0611   CL 114 (H) 06/29/2014 2116   CO2 23 04/29/2020 0611   CO2 23 06/29/2014 2116   GLUCOSE 105 (H) 04/29/2020 0611   GLUCOSE 78 06/29/2014 2116   BUN <5 (L) 04/29/2020 0611   BUN 14 06/29/2014 2116   CREATININE 0.68 04/29/2020 0611   CREATININE 0.62 06/29/2014 2116   CALCIUM 8.2 (L) 04/29/2020 0611   CALCIUM 8.5 06/29/2014 2116   PROT 7.0 04/27/2020 0159   PROT 6.9 10/27/2013 1657   ALBUMIN 3.8 04/27/2020 0159   ALBUMIN 3.7 10/27/2013 1657   AST 13 (L) 04/27/2020 0159   AST 21 10/27/2013 1657   ALT 11 04/27/2020 0159   ALT 15 10/27/2013 1657   ALKPHOS 84 04/27/2020 0159   ALKPHOS 116 10/27/2013 1657   BILITOT 0.6 04/27/2020 0159   BILITOT 0.3 10/27/2013 1657   GFRNONAA >60 04/29/2020 0611   GFRNONAA >60 06/29/2014 2116   GFRAA >60 04/29/2020 0611   GFRAA >60 06/29/2014 2116   Lipase     Component Value Date/Time   LIPASE 36 04/27/2020 0159   LIPASE 44 (L) 10/05/2013 0016       Studies/Results: No results found.  Anti-infectives: Anti-infectives (From admission, onward)  Start     Dose/Rate Route Frequency Ordered Stop   04/30/20 1000  ciprofloxacin (CIPRO) tablet 500 mg     500 mg Oral 2 times daily 04/30/20 0856     04/30/20 0900  metroNIDAZOLE (FLAGYL) tablet 500 mg     500 mg Oral Every 8 hours 04/30/20 0856     04/27/20 1230  piperacillin-tazobactam (ZOSYN) IVPB 3.375 g  Status:  Discontinued     3.375 g 12.5 mL/hr over 240 Minutes Intravenous Every 8 hours 04/27/20 1139 04/30/20 0856       Assessment/Plan HTN - actually hypotensive overnight, asymptomatic, check labs and give 500 cc bolus GERD Anxiety Hx Etoh abuse RLL nodule - recommend follow up CT 1 year  Recurrent diverticulitis with fistula -CT 5/1 showed evidence of acute diverticulitis of  the mid to distal descending colon with suggestion of a small associated diverticular abscess measuring 1.1 x 1.8 cm - IR drains x 2 placed 5/7 - one of IR drains was removed last week - CT today shows persistent diverticulitis, fistulous tract and persistent surrounding inflammatory changes - WBC8, pt afeb,lessttp in LLQ, no further drainage - advance to soft diet and PO abx - likely home tomorrow   ID -zosyn 5/25>5/28; PO cipro/flagyl 5/28>> FEN -Soft diet  VTE -SCDs, lovenox Foley -none  LOS: 3 days   Norm Parcel , [facility] 04/30/2020, 9:13 AM Please see Amion for pager number during day hours 7:00am-4:30pm  Agree with above. Doing better.  WBC 3,400 (5/28) Switched to po antibiotics and soft diet.  Hopefully home in AM. Follow up with Dr. Johney Maine next week.  Alphonsa Overall, MD, [facility] Office phone:  [phone]

## 2020-04-30 NOTE — Progress Notes (Signed)
Pt. States that she self administered her home inhaler.  No other needs noted.

## 2020-05-01 LAB — BASIC METABOLIC PANEL
Anion gap: 4 — ABNORMAL LOW (ref 5–15)
BUN: 10 mg/dL (ref 6–20)
CO2: 26 mmol/L (ref 22–32)
Calcium: 8.6 mg/dL — ABNORMAL LOW (ref 8.9–10.3)
Chloride: 108 mmol/L (ref 98–111)
Creatinine, Ser: 0.78 mg/dL (ref 0.44–1.00)
GFR calc Af Amer: >60 mL/min (ref >60)
GFR calc non Af Amer: >60 mL/min (ref >60)
Glucose, Bld: 97 mg/dL (ref 70–99)
Potassium: 4.1 mmol/L (ref 3.5–5.1)
Sodium: 138 mmol/L (ref 135–145)

## 2020-05-01 LAB — CBC
HCT: 35.8 % — ABNORMAL LOW (ref 36.0–46.0)
Hemoglobin: 10.7 g/dL — ABNORMAL LOW (ref 12.0–15.0)
MCH: 29.2 pg (ref 26.0–34.0)
MCHC: 29.9 g/dL — ABNORMAL LOW (ref 30.0–36.0)
MCV: 97.8 fL (ref 80.0–100.0)
Platelets: 175 10*3/uL (ref 150–400)
RBC: 3.66 MIL/uL — ABNORMAL LOW (ref 3.87–5.11)
RDW: 13.7 % (ref 11.5–15.5)
WBC: 5 10*3/uL (ref 4.0–10.5)
nRBC: 0 % (ref 0.0–0.2)

## 2020-05-01 NOTE — Progress Notes (Addendum)
PT refuses Pulmicort QD- takes Breztri from home. Sticky Note left for MD to discontinue Pulmicort.

## 2020-05-01 NOTE — Progress Notes (Signed)
DC instructions provided and explained in detail. Patient verbalizes understanding. Pt escorted to lobby in wheelchair to be discharged home with spouse.

## 2020-05-01 NOTE — Discharge Summary (Signed)
Patient ID: [name] MRN: [id number] DOB/AGE: November 07, 1966 54 y.o.  Admit date: 04/27/2020 Discharge date: 05/01/2020  Discharge Diagnoses Patient Active Problem List   Diagnosis Date Noted  . Diverticulitis 04/27/2020  . SUI (stress urinary incontinence, female)   . Hypertension   . History of anemia   . GERD (gastroesophageal reflux disease)   . History of alcoholism ([place]) 02/25/2020  . Intractable migraine with aura without status migrainosus 02/25/2020  . Attention deficit hyperactivity disorder (ADHD), predominantly inattentive type 01/07/2020  . GAD (generalized anxiety disorder) 01/07/2020  . Diverticulitis of large intestine with perforation and abscess 09/06/2019  . MDD (major depressive disorder) 10/07/2015   Hospital Course: She was admitted to hospital 04/27/20 with recurrent diverticulitis and fistula.  CT earlier this month demonstrated acute diverticulitis in the mid to distal descending colon with suggestion of a small diverticular abscess.  IR drains have been placed 5/7.  One was removed a week prior to presentation.  CT on admission showed persistent diverticulitis and fistulous tract with surrounding inflammatory changes.  She was admitted and started on IV antibiotics.  She did improve.  She remained afebrile and her tenderness improved.  Her drainage bag have been changed.  Flushes were stopped.  The only pain she had on the day of discharge was at the drain site but nowhere else.  She was tolerating a diet, remained afebrile, and had been on oral antibiotics now for 24 hours without any worsening symptoms.  She was deemed stable for discharge home.  She was discharged with a prescription for oral antibiotics-Cipro/Flagyl.  She has follow-up planned with one of my partners, Dr. Johney Maine, and surgery tentatively scheduled for July of this year.   Allergies as of 05/01/2020      Reactions   Codeine Nausea And Vomiting, Other (See Comments)   dizziness Other reaction(s):  Dizziness, Other (See Comments) dizziness   Hydrocodone    dizzy      Medication List    STOP taking these medications   amoxicillin-clavulanate 875-125 MG tablet Commonly known as: AUGMENTIN     TAKE these medications   acetaminophen 325 MG tablet Commonly known as: TYLENOL Take 2 tablets (650 mg total) by mouth every 6 (six) hours as needed for mild pain (or temp > 100).   Adzenys XR-ODT 12.5 MG Tbed Generic drug: Amphetamine ER Take 25 mg by mouth daily.   Breztri Aerosphere 160-9-4.8 MCG/ACT Aero Generic drug: Budeson-Glycopyrrol-Formoterol Inhale 1 puff into the lungs in the morning and at bedtime.   budesonide 0.5 MG/2ML nebulizer solution Commonly known as: PULMICORT Take 2 mLs by nebulization daily.   ciprofloxacin 500 MG tablet Commonly known as: CIPRO Take 1 tablet (500 mg total) by mouth 2 (two) times daily.   cloNIDine 0.1 MG tablet Commonly known as: CATAPRES Take 0.1 mg by mouth 4 (four) times daily.   FLUoxetine HCl 60 MG Tabs Take 60 mg by mouth daily.   fluticasone 50 MCG/ACT nasal spray Commonly known as: FLONASE Place 2 sprays into the nose daily as needed for allergies or rhinitis.   gabapentin 300 MG capsule Commonly known as: NEURONTIN Take 300-900 mg by mouth See admin instructions. Take 1 capsule (300mg ) by mouth three times daily and 3 capsules (900mg ) by mouth at bedtime.   hydrOXYzine 25 MG capsule Commonly known as: VISTARIL Take 25 mg by mouth 2 (two) times daily as needed for anxiety.   ipratropium-albuterol 0.5-2.5 (3) MG/3ML Soln Commonly known as: DUONEB Take 3 mLs by nebulization  every 6 (six) hours as needed. What changed: reasons to take this   metroNIDAZOLE 500 MG tablet Commonly known as: FLAGYL Take 1 tablet (500 mg total) by mouth every 8 (eight) hours.   multivitamin with minerals Tabs tablet Take 1 tablet by mouth daily.   N-Acetyl-L-Cysteine 600 MG Caps Take 600 mg by mouth in the morning, at noon, and at  bedtime.   ondansetron 4 MG disintegrating tablet Commonly known as: ZOFRAN-ODT Take 1 tablet (4 mg total) by mouth every 6 (six) hours as needed for nausea.   Oxycodone HCl 10 MG Tabs Take 0.5-1 tablets (5-10 mg total) by mouth every 6 (six) hours as needed for moderate pain.   ProAir HFA 108 (90 Base) MCG/ACT inhaler Generic drug: albuterol Inhale 2 puffs into the lungs every 4 (four) hours as needed for wheezing or shortness of breath.   progesterone 100 MG capsule Commonly known as: PROMETRIUM Take 200 mg by mouth daily.   SUMAtriptan 100 MG tablet Commonly known as: IMITREX Take 100 mg by mouth daily as needed for migraine.   topiramate 50 MG tablet Commonly known as: TOPAMAX Take 50 mg by mouth daily.   traZODone 100 MG tablet Commonly known as: DESYREL Take 100 mg by mouth at bedtime as needed for sleep.   Turmeric 500 MG Caps Take 500 mg by mouth daily.   Vitamin D (Ergocalciferol) 1.25 MG (50000 UNIT) Caps capsule Commonly known as: DRISDOL Take 50,000 Units by mouth once a week.        Follow-up Information    Michael Boston, MD Follow up.   Specialty: General Surgery Why: Follow up with Dr. Johney Maine as scheduled  Contact information: [address] [place] [address] [place]. Dema Severin, M.D. [facility]

## 2020-05-01 NOTE — Progress Notes (Signed)
[facility] Progress Note     Subjective:  Tolerating soft diet without n/v over last 24hrs. Having BMs. Hopeful to go home. Drain site discomfort but no pain elsewhere.  Objective: Vital signs in last 24 hours: Temp:  [97.7 F (36.5 C)-98 F (36.7 C)] 97.7 F (36.5 C) (05/29 0545) Pulse Rate:  [65] 65 (05/29 0545) Resp:  [19-20] 20 (05/29 0545) BP: (98-109)/(65-67) 99/65 (05/29 0545) SpO2:  [97 %] 97 % (05/29 0545) Last BM Date: 04/27/20  Intake/Output from previous day: 05/28 0701 - 05/29 0700 In: 600 [P.O.:600] Out: 0  Intake/Output this shift: No intake/output data recorded.  PE: General: pleasant, WD, WN whitefemale who is laying in bed in NAD Heart: rrr Lungs: Respiratory effort nonlabored Abd: soft,mildly ttp in LLQ only, ND, +BS,drain in LLQ with minimal drainage now.  MS: all 4 extremities are symmetrical with no cyanosis, clubbing, or edema. Psych: A&Ox3 with an appropriate affect.   Lab Results:  Recent Labs    04/30/20 0820 05/01/20 0540  WBC 3.4* 5.0  HGB 10.6* 10.7*  HCT 34.9* 35.8*  PLT 167 175   BMET Recent Labs    04/30/20 0820 05/01/20 0540  NA 140 138  K 4.2 4.1  CL 110 108  CO2 21* 26  GLUCOSE 102* 97  BUN 5* 10  CREATININE 0.60 0.78  CALCIUM 8.3* 8.6*   PT/INR No results for input(s): LABPROT, INR in the last 72 hours. CMP     Component Value Date/Time   NA 138 05/01/2020 0540   NA 145 06/29/2014 2116   K 4.1 05/01/2020 0540   K 3.5 06/29/2014 2116   CL 108 05/01/2020 0540   CL 114 (H) 06/29/2014 2116   CO2 26 05/01/2020 0540   CO2 23 06/29/2014 2116   GLUCOSE 97 05/01/2020 0540   GLUCOSE 78 06/29/2014 2116   BUN 10 05/01/2020 0540   BUN 14 06/29/2014 2116   CREATININE 0.78 05/01/2020 0540   CREATININE 0.62 06/29/2014 2116   CALCIUM 8.6 (L) 05/01/2020 0540   CALCIUM 8.5 06/29/2014 2116   PROT 7.0 04/27/2020 0159   PROT 6.9 10/27/2013 1657   ALBUMIN 3.8 04/27/2020 0159   ALBUMIN 3.7 10/27/2013 1657    AST 13 (L) 04/27/2020 0159   AST 21 10/27/2013 1657   ALT 11 04/27/2020 0159   ALT 15 10/27/2013 1657   ALKPHOS 84 04/27/2020 0159   ALKPHOS 116 10/27/2013 1657   BILITOT 0.6 04/27/2020 0159   BILITOT 0.3 10/27/2013 1657   GFRNONAA >60 05/01/2020 0540   GFRNONAA >60 06/29/2014 2116   GFRAA >60 05/01/2020 0540   GFRAA >60 06/29/2014 2116   Lipase     Component Value Date/Time   LIPASE 36 04/27/2020 0159   LIPASE 44 (L) 10/05/2013 0016       Studies/Results: No results found.  Anti-infectives: Anti-infectives (From admission, onward)   Start     Dose/Rate Route Frequency Ordered Stop   04/30/20 1000  ciprofloxacin (CIPRO) tablet 500 mg     500 mg Oral 2 times daily 04/30/20 0856     04/30/20 0900  metroNIDAZOLE (FLAGYL) tablet 500 mg     500 mg Oral Every 8 hours 04/30/20 0856     04/30/20 0000  metroNIDAZOLE (FLAGYL) 500 MG tablet     500 mg Oral Every 8 hours 04/30/20 1100 05/30/20 2359   04/30/20 0000  ciprofloxacin (CIPRO) 500 MG tablet     500 mg Oral 2 times daily 04/30/20 1100 05/30/20 2359  04/27/20 1230  piperacillin-tazobactam (ZOSYN) IVPB 3.375 g  Status:  Discontinued     3.375 g 12.5 mL/hr over 240 Minutes Intravenous Every 8 hours 04/27/20 1139 04/30/20 0856       Assessment/Plan HTN - actually hypotensive overnight, asymptomatic, check labs and give 500 cc bolus GERD Anxiety Hx Etoh abuse RLL nodule - recommend follow up CT 1 year  Recurrent diverticulitis with fistula -CT 5/1 showed evidence of acute diverticulitis of the mid to distal descending colon with suggestion of a small associated diverticular abscess measuring 1.1 x 1.8 cm - IR drains x 2 placed 5/7 - one of IR drains was removed last week - WBCremains normal, pt afeb, not significantly tender; no further drainage - soft diet as tolerated - ok for discharge from our perspective - planning follow-up with Dr. Johney Maine ID -zosyn 5/25>5/28; PO  cipro/flagyl 5/28>> FEN -Soft diet  VTE -SCDs, lovenox Foley -none  LOS: 4 days   Nadeen Landau, M.D. [facility]

## 2020-05-01 NOTE — Plan of Care (Signed)

## 2020-05-01 NOTE — Discharge Instructions (Signed)
Low-Fiber Eating Plan Fiber is found in fruits, vegetables, whole grains, and beans. Eating a diet low in fiber helps to reduce how often you have bowel movements and how much you produce during a bowel movement. A low-fiber eating plan may help your digestive system heal if:  You have certain conditions, such as Crohn's disease or diverticulitis.  You recently had radiation therapy on your pelvis or bowel.  You recently had intestinal surgery.  You have a new surgical opening in your abdomen (colostomy or ileostomy).  Your intestine is narrowed (stricture). Your health care provider will determine how long you need to stay on this diet. Your health care provider may recommend that you work with a diet and nutrition specialist (dietitian). What are tips for following this plan? General guidelines  Follow recommendations from your dietitian about how much fiber you should have each day.  Most people on this eating plan should try to eat less than 10 grams (g) of fiber each day. Your daily fiber goal is _________________ g.  Take vitamin and mineral supplements as told by your health care provider or dietitian. Chewable or liquid forms are best when on this eating plan. Reading food labels  Check food labels for the amount of dietary fiber.  Choose foods that have less than 2 grams of fiber in one serving. Cooking  Use Yatziri Wainwright flour and other allowed grains for baking and cooking.  Cook meat using methods that keep it tender, such as braising or poaching.  Cook eggs until the yolk is completely solid.  Cook with healthy oils, such as olive oil or canola oil. Meal planning   Eat 5-6 small meals throughout the day instead of 3 large meals.  If you are lactose intolerant: ? Choose low-lactose dairy foods. ? Do not eat dairy foods, if told by your dietitian.  Limit fat and oils to less than 8 teaspoons a day.  Eat small portions of desserts. What foods are allowed? The items  listed below may not be a complete list. Talk with your dietitian about what dietary choices are best for you. Grains All bread and crackers made with Lashanta Elbe flour. Waffles, pancakes, and French toast. Bagels. Pretzels. Melba toast, zwieback, and matzoh. Cooked and dried cereals that do not contain whole grains, added fiber, seeds, or dried fruit. Cornmeal. [place]. Hot and cold cereals made with refined corn, wheat, rice, or oats. Plain pasta and noodles. [place] Frances rice. Vegetables Well-cooked or canned vegetables without skin, seeds, or stems. Cooked potatoes without skins. Vegetable juice. Fruits Soft-cooked or canned fruits without skin and seeds. Peeled ripe banana. Applesauce. Fruit juice without pulp. Meats and other protein foods Ground meat. Tender cuts of meat or poultry. Eggs. Fish, seafood, and shellfish. Smooth nut butters. Tofu. Dairy All milk products and drinks. Lactose-free milks, including rice, soy, and almond milks. Yogurt without fruit, nuts, chocolate, or granola mix-ins. Sour cream. Cottage cheese. Cheese. Beverages Decaf coffee. Fruit and vegetable juices or smoothies (in small amounts, with no pulp or skins, and with fruits from allowed list). Sports drinks. Herbal tea. Fats and oils Olive oil, canola oil, sunflower oil, flaxseed oil, and grapeseed oil. Mayonnaise. Cream cheese. Margarine. Butter. Sweets and desserts Plain cakes and cookies. Cream pies and pies made with allowed fruits. Pudding. Custard. Fruit gelatin. Sherbet. Popsicles. Ice cream without nuts. Plain hard candy. Honey. Jelly. Molasses. Syrups, including chocolate syrup. Chocolate. Marshmallows. Gumdrops. Seasoning and other foods Bouillon. Broth. Cream soups made from allowed foods. Strained soup. Casseroles made   with allowed foods. Ketchup. Mild mustard. Mild salad dressings. Plain gravies. Vinegar. Spices in moderation. Salt. Sugar. What foods are not allowed? The items listed below may not be a complete  list. Talk with your dietitian about what dietary choices are best for you. Grains Whole wheat and whole grain breads and crackers. Multigrain breads and crackers. Rye bread. Whole grain or multigrain cereals. Cereals with nuts, raisins, or coconut. Bran. Coarse wheat cereals. Granola. High-fiber cereals. Cornmeal or corn bread. Whole grain pasta. Wild or brown rice. Quinoa. Popcorn. Buckwheat. Wheat germ. Vegetables Potato skins. Raw or undercooked vegetables. All beans and bean sprouts. Cooked greens. Corn. Peas. Cabbage. Beets. Broccoli. Brussels sprouts. Cauliflower. Mushrooms. Onions. Peppers. Parsnips. Okra. Sauerkraut. Fruit Raw or dried fruit. Berries. Fruit juice with pulp. Prune juice. Meats and other protein foods Tough, fibrous meats with gristle. Fatty meat. Poultry with skin. Fried meat, poultry, or fish. Deli or lunch meats. Sausage, bacon, and hot dogs. Nuts and chunky nut butter. Dried peas, beans, and lentils. Dairy Yogurt with fruit, nuts, chocolate, or granola mix-ins. Beverages Caffeinated coffee and teas. Fats and oils Avocado. Coconut. Sweets and desserts Desserts, cookies, or candies that contain nuts or coconut. Dried fruit. Jams and preserves with seeds. Marmalade. Any dessert made with fruits or grains that are not allowed. Seasoning and other foods Corn tortilla chips. Soups made with vegetables or grains that are not allowed. Relish. Horseradish. Pickles. Olives. Summary  Most people on a low-fiber eating plan should eat less than 10 grams of fiber a day. Follow recommendations from your dietitian about how much fiber you should have each day.  Always check food labels to see the dietary fiber content of packaged foods. In general, a low-fiber food will have fewer than 2 grams of fiber per serving.  In general, try to avoid whole grains, raw fruits and vegetables, dried fruit, tough cuts of meat, nuts, and seeds.  Take a vitamin and mineral supplement as told  by your health care provider or dietitian. This information is not intended to replace advice given to you by your health care provider. Make sure you discuss any questions you have with your health care provider. Document Revised: 03/14/2019 Document Reviewed: 01/23/2017 Elsevier Patient Education  2020 Elsevier Inc.  

## 2020-05-05 ENCOUNTER — Ambulatory Visit (HOSPITAL_COMMUNITY): Payer: Self-pay | Admitting: Surgery

## 2020-05-05 NOTE — H&P (Signed)
[name] Appointment: 05/05/2020 10:45 AM Location: [facility] Patient #: [id number] DOB: 1965/12/24 Married / Language: Cleophus Molt / Race: White Female   History of Present Illness [name] MD; 05/05/2020 12:25 PM) The patient is a 54 year old female who presents with diverticulitis. Note for "Diverticulitis": ` ` ` Patient sent for surgical consultation  Chief Complaint: Diverticulitis with chronic abscess and fistula ` ` The patient is a woman who had episode of diverticulitis and worsened. Required percutaneous drainage of abscess. Seemed to improve but then had worsening discomfort. Started back on oral antibiotic. Head drain study last week. Still with some diverticulitis. However no new abscess. Smaller abscess cavity with colon connection concerning for fistula. She felt worse discomfort and was concerned. Came emergency room. Was readmitted last week. Improved on IV antibiotics after a few days. She was discharged on oral medications, switching from Augmentin to Cipro/Flagyl. His tentative that she will get surgical resection 14th of July. She comes in today for follow up with her husband  She is feeling better. Appetite improving. Tolerate the Cipro and Flagyl. She has not been flushing the drain per radiology instructions. Very little is coming out now. Not needing stronger pain meds. No fevers or chills. Moving her bowels a few times a day.    (Review of systems as stated in this history (HPI) or in the review of systems. Otherwise all other 12 point ROS are negative) ` ` `  This patient encounter took 30 minutes today to perform the following: obtain history, perform exam, review outside records, interpret tests & imaging, counsel the patient on their diagnosis; and, document this encounter, including findings & plan in the electronic health record (EHR).   Past Surgical History Sabino Gasser, CMA; 05/05/2020 10:59 AM) Cesarean  Section - Multiple  Oral Surgery  Shoulder Surgery  Right.  Diagnostic Studies History Sabino Gasser, CMA; 05/05/2020 10:59 AM) Colonoscopy  within last year Mammogram  within last year Pap Smear  >5 years ago  Allergies Sabino Gasser, CMA; 05/05/2020 10:59 AM) No Known Drug Allergies  [05/05/2020]: Allergies Reconciled   Medication History Sabino Gasser, CMA; 05/05/2020 11:01 AM) Gabapentin (300MG  Capsule, Oral) Active. Progesterone Micronized (100MG  Capsule, Oral) Active. metroNIDAZOLE (500MG  Tablet, Oral) Active. Vitamin D (Ergocalciferol) (1.25 MG(50000 UT) Capsule, Oral) Active. FLUoxetine HCl (60MG  Tablet, Oral) Active. Ciprofloxacin HCl (500MG  Tablet, Oral) Active. Amoxicillin-Pot Clavulanate (875-125MG  Tablet, 1 (one) Oral two times daily, Taken starting 04/26/2020) Active. Medications Reconciled  Social History Sabino Gasser, CMA; 05/05/2020 10:59 AM) Alcohol use  Remotely quit alcohol use. Caffeine use  Tea. No drug use  Tobacco use  Former smoker.  Family History Sabino Gasser, [place]; 05/05/2020 10:59 AM) Family history unknown  First Degree Relatives   Pregnancy / Birth History Sabino Gasser, [place]; 05/05/2020 10:59 AM) Age at menarche  70 years. Age of menopause  52-50 Gravida  3 Maternal age  28-30 Para  2  Other Problems Sabino Gasser, [place]; 05/05/2020 10:59 AM) Alcohol Abuse  Anxiety Disorder  Bladder Problems  Diverticulosis  Migraine Headache     Review of Systems Sabino Gasser CMA; 05/05/2020 10:59 AM) General Not Present- Appetite Loss, Chills, Fatigue, Fever, Night Sweats, Weight Gain and Weight Loss. Skin Not Present- Change in Wart/Mole, Dryness, Hives, Jaundice, New Lesions, Non-Healing Wounds, Rash and Ulcer. HEENT Not Present- Earache, Hearing Loss, Hoarseness, Nose Bleed, Oral Ulcers, Ringing in the Ears, Seasonal Allergies, Sinus Pain, Sore Throat, Visual Disturbances, Wears glasses/contact lenses and Yellow  Eyes. Respiratory Not Present-  Bloody sputum, Chronic Cough, Difficulty Breathing, Snoring and Wheezing. Breast Not Present- Breast Mass, Breast Pain, Nipple Discharge and Skin Changes. Cardiovascular Not Present- Chest Pain, Difficulty Breathing Lying Down, Leg Cramps, Palpitations, Rapid Heart Rate, Shortness of Breath and Swelling of Extremities. Gastrointestinal Present- Abdominal Pain, Bloating, Nausea and Vomiting. Not Present- Bloody Stool, Change in Bowel Habits, Chronic diarrhea, Constipation, Difficulty Swallowing, Excessive gas, Gets full quickly at meals, Hemorrhoids, Indigestion and Rectal Pain. Female Genitourinary Not Present- Frequency, Nocturia, Painful Urination, Pelvic Pain and Urgency. Musculoskeletal Not Present- Back Pain, Joint Pain, Joint Stiffness, Muscle Pain, Muscle Weakness and Swelling of Extremities. Neurological Not Present- Decreased Memory, Fainting, Headaches, Numbness, Seizures, Tingling, Tremor, Trouble walking and Weakness. Psychiatric Not Present- Anxiety, Bipolar, Change in Sleep Pattern, Depression, Fearful and Frequent crying. Endocrine Not Present- Cold Intolerance, Excessive Hunger, Hair Changes, Heat Intolerance, Hot flashes and New Diabetes. Hematology Not Present- Blood Thinners, Easy Bruising, Excessive bleeding, Gland problems, HIV and Persistent Infections.  Vitals Sabino Gasser CMA; 05/05/2020 11:03 AM) 05/05/2020 11:01 AM Weight: 152 lb Height: 65in Body Surface Area: 1.76 m Body Mass Index: 25.29 kg/m  Temp.: 98.7F (Tympanic)  Pulse: 87 (Regular)  BP: 132/80(Sitting, Left Arm, Standard)       Physical Exam [name] MD; 05/05/2020 12:26 PM) General Mental Status-Alert. General Appearance-Not in acute distress, Not Sickly. Orientation-Oriented X3. Hydration-Well hydrated. Voice-Normal.  Integumentary Global Assessment Upon inspection and palpation of skin surfaces of the - Axillae: non-tender, no  inflammation or ulceration, no drainage. and Distribution of scalp and body hair is normal. General Characteristics Temperature - normal warmth is noted.  Head and Neck Head-normocephalic, atraumatic with no lesions or palpable masses. Face Global Assessment - atraumatic, no absence of expression. Neck Global Assessment - no abnormal movements, no bruit auscultated on the right, no bruit auscultated on the left, no decreased range of motion, non-tender. Trachea-midline. Thyroid Gland Characteristics - non-tender.  Eye Eyeball - Left-Extraocular movements intact, No Nystagmus - Left. Eyeball - Right-Extraocular movements intact, No Nystagmus - Right. Cornea - Left-No Hazy - Left. Cornea - Right-No Hazy - Right. Sclera/Conjunctiva - Left-No scleral icterus, No Discharge - Left. Sclera/Conjunctiva - Right-No scleral icterus, No Discharge - Right. Pupil - Left-Direct reaction to light normal. Pupil - Right-Direct reaction to light normal.  ENMT Ears Pinna - Left - no drainage observed, no generalized tenderness observed. Pinna - Right - no drainage observed, no generalized tenderness observed. Nose and Sinuses External Inspection of the Nose - no destructive lesion observed. Inspection of the nares - Left - quiet respiration. Inspection of the nares - Right - quiet respiration. Mouth and Throat Lips - Upper Lip - no fissures observed, no pallor noted. Lower Lip - no fissures observed, no pallor noted. Nasopharynx - no discharge present. Oral Cavity/Oropharynx - Tongue - no dryness observed. Oral Mucosa - no cyanosis observed. Hypopharynx - no evidence of airway distress observed.  Chest and Lung Exam Inspection Movements - Normal and Symmetrical. Accessory muscles - No use of accessory muscles in breathing. Palpation Palpation of the chest reveals - Non-tender. Auscultation Breath sounds - Normal and Clear.  Cardiovascular Auscultation Rhythm - Regular.  Murmurs & Other Heart Sounds - Auscultation of the heart reveals - No Murmurs and No Systolic Clicks.  Abdomen Inspection Inspection of the abdomen reveals - No Visible peristalsis and No Abnormal pulsations. Umbilicus - No Bleeding, No Urine drainage. Palpation/Percussion Palpation and Percussion of the abdomen reveal - Soft, Non Tender, No Rebound tenderness, No Rigidity (guarding) and  No Cutaneous hyperesthesia. Note: Abdomen overweight but soft. Not severely distended. No diastasis recti. No umbilical or other anterior abdominal wall hernias  Drains side at left lower quadrant. Clean dressing. Minimal tan drainage in biliary bag   Female Genitourinary Sexual Maturity Tanner 5 - Adult hair pattern. Note: No vaginal bleeding nor discharge   Peripheral Vascular Upper Extremity Inspection - Left - No Cyanotic nailbeds - Left, Not Ischemic. Inspection - Right - No Cyanotic nailbeds - Right, Not Ischemic.  Neurologic Neurologic evaluation reveals -normal attention span and ability to concentrate, able to name objects and repeat phrases. Appropriate fund of knowledge , normal sensation and normal coordination. Mental Status Affect - not angry, not paranoid. Cranial Nerves-Normal Bilaterally. Gait-Normal.  Neuropsychiatric Mental status exam performed with findings of-able to articulate well with normal speech/language, rate, volume and coherence, thought content normal with ability to perform basic computations and apply abstract reasoning and no evidence of hallucinations, delusions, obsessions or homicidal/suicidal ideation.  Musculoskeletal Global Assessment Spine, Ribs and Pelvis - no instability, subluxation or laxity. Right Upper Extremity - no instability, subluxation or laxity.  Lymphatic Head & Neck  General Head & Neck Lymphatics: Bilateral - Description - No Localized lymphadenopathy. Axillary  General Axillary Region: Bilateral - Description - No  Localized lymphadenopathy. Femoral & Inguinal  Generalized Femoral & Inguinal Lymphatics: Left - Description - No Localized lymphadenopathy. Right - Description - No Localized lymphadenopathy.    Assessment & Plan [name] MD; 05/05/2020 12:28 PM) DIVERTICULITIS OF INTESTINE WITH ABSCESS (K57.80) Impression: Recovering status post hospitalizations for diverticulitis with abscess requiring drainage and readmission with discomfort.  She seems to be doing much better overall.  Repeat follow-up drain study next week. Because the output is minimal and she is feeling better, hopefully the abscess & colon fistula has finally closed. Hopefully can remove the drain. If still persistent fistula, keep drain until surgery.  Continue oral Cipro/Flagyl antibiotics at least until drain study. If no persistent fistula/abscess and better, see if she can stop all antibiotics. Otherwise if has recurring symptoms or persistent symptoms, continue ABX until surgery.  Robotic distal colectomy next month to allow time for infection/inflammation be minimal, therefore enhancing the chance of minimally invasive approach with immediate anastomosis & avoiding need for ostomy  Ideally would give firefly cystoscopy and ureteral injections given the inflammation and chronic abscess. Patient is feeling better. Technique in discussion made again with the patient and her husband.  She is hoping to get back to work at least part-time. That is reasonable. If she feels worse, stay out until she gets surgery. Current Plans Written instructions provided You are being scheduled for surgery- Our schedulers will call you.  You should hear from our office's scheduling department within 5 working days about the location, date, and time of surgery. We try to make accommodations for patient's preferences in scheduling surgery, but sometimes the OR schedule or the surgeon's schedule prevents [place] from making those  accommodations.  If you have not heard from our office [phone]) in 5 working days, call the office and ask for your surgeon's nurse.  If you have other questions about your diagnosis, plan, or surgery, call the office and ask for your surgeon's nurse.  The patient was instructed to call back in 2 weeks with progress Pt Education - Pamphlet Given - Laparoscopic Colorectal Surgery: discussed with patient and provided information. You are being scheduled for surgery- Our schedulers will call you.  You should hear from our office's scheduling department  within 5 working days about the location, date, and time of surgery. We try to make accommodations for patient's preferences in scheduling surgery, but sometimes the OR schedule or the surgeon's schedule prevents [place] from making those accommodations.  If you have not heard from our office [phone] in 5 working days, call the office and ask for your surgeon's nurse.  If you have other questions about your diagnosis, plan, or surgery, call the office and ask for your surgeon's nurse.  The anatomy & physiology of the digestive tract was discussed. The pathophysiology of the colon was discussed. Natural history risks without surgery was discussed. I feel the risks of no intervention will lead to serious problems that outweigh the operative risks; therefore, I recommended a partial colectomy to remove the pathology. Minimally invasive (Robotic/Laparoscopic) & open techniques were discussed.  Risks such as bleeding, infection, abscess, leak, reoperation, possible ostomy, hernia, heart attack, death, and other risks were discussed. I noted a good likelihood this will help address the problem. Goals of post-operative recovery were discussed as well. Need for adequate nutrition, daily bowel regimen and healthy physical activity, to optimize recovery was noted as well. We will work to minimize complications. Educational materials were  available as well. Questions were answered. The patient expresses understanding & wishes to proceed with surgery.  Pt Education - [facility] Colectomy post-op instructions: discussed with patient and provided information. Pt Education - [facility] Diverticular Disease (AT) ANXIETY (F41.9)    Signed electronically by [name], MD (05/05/2020 12:28 PM)  [name], MD, FACS, MASCRS Gastrointestinal and Minimally Invasive Surgery  [facility] [address], [place], [place] [address] [phone] Fax [phone] Main/Paging  CONTACT INFORMATION: Weekday (9AM-5PM) concerns: Call [facility] main office at [phone] Weeknight (5PM-9AM) or Weekend/Holiday concerns: Check [email or web address] for General Surgery [facility] coverage (Please, do not use SecureChat as it is not reliable communication to operating surgeons for immediate patient care)

## 2020-05-11 ENCOUNTER — Other Ambulatory Visit: Payer: Commercial Managed Care - PPO

## 2020-05-12 ENCOUNTER — Ambulatory Visit
Admission: RE | Admit: 2020-05-12 | Discharge: 2020-05-12 | Disposition: A | Discharge status: Home / Self Care | Payer: Commercial Managed Care - PPO | Source: Ambulatory Visit | Attending: Surgery | Admitting: Surgery

## 2020-05-12 ENCOUNTER — Encounter: Payer: Self-pay | Admitting: Radiology

## 2020-05-12 DIAGNOSIS — K651 Peritoneal abscess: Secondary | ICD-10-CM

## 2020-05-12 HISTORY — PX: IR RADIOLOGIST EVAL & MGMT: IMG5224

## 2020-05-12 NOTE — Progress Notes (Signed)
Referring Physician(s): Gross,Steven  Chief Complaint: The patient is seen in follow up today s/p diverticular abscess drains placed in IR 04/09/20  History of present illness:  Initial abscess drains placed in IR 04/09/20. 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 That drain still intact 5/25 CT revealed persistent fistula but decompressed collection. She was experiencing abd pain and low grade temp. She was admitted to [facility] and Discharged 5/29 on po antibiotics. Still on Cipro/Flagyl. Was seen by Dr Johney [place] 05/05/20-- all seemed to be well-- planned for probable surgery 06/16/20 Was scheduled to return to [facility] next week but noted gas like pains--- even with "bubbles" coming from drain site yesterday and today. Here today for drain injection and evaluation.  Denies fever; chills +pain in low left ad Denies N/V/D  Past Medical History:  Diagnosis Date  . ADHD (attention deficit hyperactivity disorder)   . Alcohol dependence with acute alcoholic intoxication with complication ([place]) 02/09/299  . Anemia   . Anxiety   . Cancer ([place])    bladder  . Cutaneous lupus erythematosus   . 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 GERD (gastroesophageal reflux disease)   . History of anemia   . Hypertension   . Severe episode of recurrent major depressive disorder, without psychotic features ([place]) 01/07/2020  . SUI (stress urinary incontinence, female)     Past Surgical History:  Procedure Laterality Date  . BLADDER SUSPENSION N/A 02/13/2013   Procedure: [facility] SLING;  Surgeon: Malka So, MD;  Location: [facility];  Service: Urology;  Laterality: N/A;  . CESAREAN SECTION  12-23-2004    W/ LEFT TUBAL LIGATION AND RIGHT SALPINGECTOMY  . CESAREAN SECTION  1995  . IR RADIOLOGIST EVAL & MGMT  04/21/2020  . IR RADIOLOGIST EVAL & MGMT  05/12/2020  . ORIF HUMERUS FRACTURE Right 04/28/2014     Procedure: OPEN REDUCTION INTERNAL FIXATION (ORIF) PROXIMAL HUMERUS FRACTURE;  Surgeon: Nita Sells, MD;  Location: [facility];  Service: Orthopedics;  Laterality: Right;  . ORIF PROXIMAL HUMERUS FRACTURE Right 04/28/2014   DR CHANDLER  . TUBAL LIGATION      Allergies: Codeine and Hydrocodone  Medications: Prior to Admission medications   Medication Sig Start Date End Date Taking? Authorizing Provider  acetaminophen (TYLENOL) 325 MG tablet Take 2 tablets (650 mg total) by mouth every 6 (six) hours as needed for mild pain (or temp > 100). 04/12/20   Meuth, Brooke A, PA-C  Acetylcysteine (N-ACETYL-L-CYSTEINE) 600 MG CAPS Take 600 mg by mouth in the morning, at noon, and at bedtime.    [provider]  ADZENYS XR-ODT 12.5 MG TBED Take 25 mg by mouth daily.  11/28/18   [provider]  BREZTRI AEROSPHERE 160-9-4.8 MCG/ACT AERO Inhale 1 puff into the lungs in the morning and at bedtime. 03/24/20   [provider]  budesonide (PULMICORT) 0.5 MG/2ML nebulizer solution Take 2 mLs by nebulization daily. 03/17/20   [provider]  ciprofloxacin (CIPRO) 500 MG tablet Take 1 tablet (500 mg total) by mouth 2 (two) times daily. 04/30/20 05/30/20  Norm Parcel, PA-C  cloNIDine (CATAPRES) 0.1 MG tablet Take 0.1 mg by mouth 4 (four) times daily.    [provider]  FLUoxetine HCl 60 MG TABS Take 60 mg by mouth daily. 08/25/19   [provider]  fluticasone (FLONASE) 50 MCG/ACT nasal spray Place 2 sprays into the  nose daily as needed for allergies or rhinitis.  01/28/19   [provider]  gabapentin (NEURONTIN) 300 MG capsule Take 300-900 mg by mouth See admin instructions. Take 1 capsule (300mg ) by mouth three times daily and 3 capsules (900mg ) by mouth at bedtime.    [provider]  hydrOXYzine (VISTARIL) 25 MG capsule Take 25 mg by mouth 2 (two) times daily as needed for anxiety. 08/07/19   [provider]   ipratropium-albuterol (DUONEB) 0.5-2.5 (3) MG/3ML SOLN Take 3 mLs by nebulization every 6 (six) hours as needed. Patient taking differently: Take 3 mLs by nebulization every 6 (six) hours as needed (shortness of breath/wheezing).  09/10/19   Tylene Fantasia, PA-C  metroNIDAZOLE (FLAGYL) 500 MG tablet Take 1 tablet (500 mg total) by mouth every 8 (eight) hours. 04/30/20 05/30/20  Norm Parcel, PA-C  Multiple Vitamin (MULTIVITAMIN WITH MINERALS) TABS tablet Take 1 tablet by mouth daily. 04/13/20   Meuth, Brooke A, PA-C  ondansetron (ZOFRAN-ODT) 4 MG disintegrating tablet Take 1 tablet (4 mg total) by mouth every 6 (six) hours as needed for nausea. 04/30/20   Norm Parcel, PA-C  oxyCODONE 10 MG TABS Take 0.5-1 tablets (5-10 mg total) by mouth every 6 (six) hours as needed for moderate pain. 04/30/20   Norm Parcel, PA-C  PROAIR HFA 108 [phone] Base) MCG/ACT inhaler Inhale 2 puffs into the lungs every 4 (four) hours as needed for wheezing or shortness of breath.  11/24/18   [provider]  progesterone (PROMETRIUM) 100 MG capsule Take 200 mg by mouth daily.  08/25/19   [provider]  SUMAtriptan (IMITREX) 100 MG tablet Take 100 mg by mouth daily as needed for migraine. 03/09/20   [provider]  topiramate (TOPAMAX) 50 MG tablet Take 50 mg by mouth daily. 03/08/20   [provider]  traZODone (DESYREL) 100 MG tablet Take 100 mg by mouth at bedtime as needed for sleep.     [provider]  Turmeric 500 MG CAPS Take 500 mg by mouth daily.    [provider]  Vitamin D, Ergocalciferol, (DRISDOL) 1.25 MG (50000 UNIT) CAPS capsule Take 50,000 Units by mouth once a week. 03/18/20   [provider]     Family History  Adopted: Yes    Social History   Socioeconomic History  . Marital status: Married    Spouse name: Not on file  . Number of children: Not on file  . Years of education: Not on file  . Highest education level: Not on file   Occupational History  . Not on file  Tobacco Use  . Smoking status: Former Smoker    Packs/day: 0.25    Years: 20.00    Pack years: 5.00    Types: Cigarettes    Quit date: 09/24/2019    Years since quitting: 0.6  . Smokeless tobacco: Never Used  Substance and Sexual Activity  . Alcohol use: Yes    Comment: was in recovery for 4 months.   . Drug use: No  . Sexual activity: Not Currently    Birth control/protection: Post-menopausal  Other Topics Concern  . Not on file  Social History Narrative  . Not on file   Social Determinants of Health   Financial Resource Strain:   . Difficulty of Paying Living Expenses:   Food Insecurity:   . Worried About Charity fundraiser in the Last Year:   . [place] in the Last Year:  Transportation Needs:   . Film/video editor (Medical):   Marland Kitchen Lack of Transportation (Non-Medical):   Physical Activity:   . Days of Exercise per Week:   . Minutes of Exercise per Session:   Stress:   . Feeling of Stress :   Social Connections:   . Frequency of Communication with Friends and Family:   . Frequency of Social Gatherings with Friends and Family:   . Attends Religious Services:   . Active Member of Clubs or Organizations:   . Attends Archivist Meetings:   Marland Kitchen Marital Status:      Vital Signs: BP 107/68 (BP Location: Right Arm)   Pulse 84   Temp (!) 97.3 F (36.3 C)   SpO2 100%   Physical Exam Abdominal:     Palpations: Abdomen is soft.  Skin:    General: Skin is warm and dry.     Comments: Drain site is clean and dry NT no bleeding No sign of infection No sign of compromise of drain  Drain injection continues to show ++ fistula       Imaging: IR Radiologist Eval & Mgmt  Result Date: 05/12/2020 Please refer to notes tab for details about interventional procedure. (Op Note)   Labs:  CBC: Recent Labs    04/28/20 0522 04/29/20 0611 04/30/20 0820 05/01/20 0540  WBC 7.1 8.2 3.4* 5.0  HGB 10.6*  10.3* 10.6* 10.7*  HCT 34.9* 35.3* 34.9* 35.8*  PLT 198 183 167 175    COAGS: Recent Labs    04/09/20 0921  INR 1.1    BMP: Recent Labs    04/28/20 0522 04/29/20 0611 04/30/20 0820 05/01/20 0540  NA 136 138 140 138  K 4.2 4.4 4.2 4.1  CL 109 109 110 108  CO2 22 23 21* 26  GLUCOSE 112* 105* 102* 97  BUN 9 <5* 5* 10  CALCIUM 8.1* 8.2* 8.3* 8.6*  CREATININE 0.72 0.68 0.60 0.78  GFRNONAA >60 >60 >60 >60  GFRAA >60 >60 >60 >60    LIVER FUNCTION TESTS: Recent Labs    09/06/19 1325 09/07/19 0614 04/03/20 0600 04/27/20 0159  BILITOT 1.4* 1.2 0.4 0.6  AST 14* 10* 16 13*  ALT 13 11 16 11   ALKPHOS 107 75 84 84  PROT 7.6 5.8* 6.3* 7.0  ALBUMIN 4.3 3.3* 3.6 3.8    Assessment:  Diverticular abscess LLQ drain intact Drain injection showing persistent fistula per Dr Kathlene Cote Plan: to continue bag drain; no flushes Follow up with Dr Johney [place]-- likely surgery 06/16/20 No need to follow with IR unless has any further problems or questions    Signed: Lavonia Drafts, PA-C 05/12/2020, 1:42 PM   Please refer to Dr. Kathlene Cote attestation of this note for management and plan.

## 2020-05-18 ENCOUNTER — Other Ambulatory Visit: Payer: Commercial Managed Care - PPO

## 2020-06-04 ENCOUNTER — Ambulatory Visit: Payer: Self-pay | Admitting: Surgery

## 2020-06-04 NOTE — Patient Instructions (Addendum)
DUE TO COVID-19 ONLY ONE VISITOR IS ALLOWED TO COME WITH YOU AND STAY IN THE WAITING ROOM ONLY DURING PRE OP AND PROCEDURE DAY OF SURGERY. THE 2 VISITORS  MAY VISIT WITH YOU AFTER SURGERY IN YOUR PRIVATE ROOM DURING VISITING HOURS ONLY!  YOU NEED TO HAVE A COVID 19 TEST ON__7/10_____ @__9 :55_____, THIS TEST MUST BE DONE BEFORE SURGERY, COME  [place], [place] , [address].  ([place]) ONCE YOUR COVID TEST IS COMPLETED, PLEASE BEGIN THE QUARANTINE INSTRUCTIONS AS OUTLINED IN YOUR HANDOUT.                MELIYAH SIMON    Your procedure is scheduled on: 06/16/20   Report to [facility] Main  Entrance   Report to admitting at 10:00 AM     Call this number if you have problems the morning of surgery [phone]   Follow all instructions for the bowel prep from Dr. Clyda Greener office  Neomycin and Metro take  at 2 pm, 3 pm and 10 pm after Miralax bowel prep the day prior to surgery  Drink plenty of fluid the day of prep to prevent dehydration.   BRUSH YOUR TEETH MORNING OF SURGERY AND RINSE YOUR MOUTH OUT, NO CHEWING GUM CANDY OR MINTS.   DRINK 2 PRE SURGERY ENSURE DRINKS THE NIGHT BEFORE SURGERY AT 10:00 PM.   . NO SOLIDS AFTER MIDNIGHT THE DAY PRIOR TO THE SURGERY.   NOTHING BY MOUTH EXCEPT CLEAR LIQUIDS UNTIL THREE HOURS PRIOR TO SCHEDULED SURGERY.   PLEASE FINISH PRE SURGERY ENSURE DRINK PER SURGEON ORDER 3 HOURS PRIOR TO SCHEDULED SURGERY TIME WHICH NEEDS TO BE COMPLETED AT __9:00 am_______.   Take these medicines the morning of surgery with A SIP OF WATER: Gabapentin, Topamax, Clonidine,Fluoxetine, Trazodone,Progesterone                                  You may not have any metal on your body including hair pins and              piercings  Do not wear jewelry, make-up, lotions, powders or perfumes, deodorant             Do not wear nail polish on your fingernails.  Do not shave  48 hours prior to surgery.                 Do not bring  valuables to the hospital. [place].  Contacts, dentures or bridgework may not be worn into surgery.        Special Instructions: N/A              Please read over the following fact sheets you were given: _____________________________________________________________________             [facility] - Preparing for Surgery Before surgery, you can play an important role.   Because skin is not sterile, your skin needs to be as free of germs as possible .  You can reduce the number of germs on your skin by washing with CHG (chlorahexidine gluconate) soap before surgery.   CHG is an antiseptic cleaner which kills germs and bonds with the skin to continue killing germs even after washing. Please DO NOT use if you have an allergy to CHG or antibacterial soaps .  If your skin becomes reddened/irritated stop using the CHG and inform your nurse when you arrive at Short Stay. Do not shave (including legs and underarms) for at least 48 hours prior to the first CHG shower  Please follow these instructions carefully:  1.  Shower with CHG Soap the night before surgery and the  morning of Surgery.  2.  If you choose to wash your hair, wash your hair first as usual with your  normal  shampoo.  3.  After you shampoo, rinse your hair and body thoroughly to remove the  shampoo.                                        4.  Use CHG as you would any other liquid soap.  You can apply chg directly  to the skin and wash                       Gently with a scrungie or clean washcloth.  5.  Apply the CHG Soap to your body ONLY FROM THE NECK DOWN.   Do not use on face/ open                           Wound or open sores. Avoid contact with eyes, ears mouth and genitals (private parts).                       Wash face,  Genitals (private parts) with your normal soap.             6.  Wash thoroughly, paying special attention to the area where your surgery  will be  performed.  7.  Thoroughly rinse your body with warm water from the neck down.  8.  DO NOT shower/wash with your normal soap after using and rinsing off  the CHG Soap.             9.  Pat yourself dry with a clean towel.            10.  Wear clean pajamas.            11.  Place clean sheets on your bed the night of your first shower and do not  sleep with pets. Day of Surgery : Do not apply any lotions/deodorants the morning of surgery.  Please wear clean clothes to the hospital/surgery center.  FAILURE TO FOLLOW THESE INSTRUCTIONS MAY RESULT IN THE CANCELLATION OF YOUR SURGERY PATIENT SIGNATURE_________________________________  NURSE SIGNATURE__________________________________  ________________________________________________________________________   Adam Phenix  An incentive spirometer is a tool that can help keep your lungs clear and active. This tool measures how well you are filling your lungs with each breath. Taking long deep breaths may help reverse or decrease the chance of developing breathing (pulmonary) problems (especially infection) following:  A long period of time when you are unable to move or be active. BEFORE THE PROCEDURE   If the spirometer includes an indicator to show your best effort, your nurse or respiratory therapist will set it to a desired goal.  If possible, sit up straight or lean slightly forward. Try not to slouch.  Hold the incentive spirometer in an upright position. INSTRUCTIONS FOR USE  1. Sit on the edge of your bed if possible, or sit up as far as you can  in bed or on a chair. 2. Hold the incentive spirometer in an upright position. 3. Breathe out normally. 4. Place the mouthpiece in your mouth and seal your lips tightly around it. 5. Breathe in slowly and as deeply as possible, raising the piston or the ball toward the top of the column. 6. Hold your breath for 3-5 seconds or for as long as possible. Allow the piston or ball to fall  to the bottom of the column. 7. Remove the mouthpiece from your mouth and breathe out normally. 8. Rest for a few seconds and repeat Steps 1 through 7 at least 10 times every 1-2 hours when you are awake. Take your time and take a few normal breaths between deep breaths. 9. The spirometer may include an indicator to show your best effort. Use the indicator as a goal to work toward during each repetition. 10. After each set of 10 deep breaths, practice coughing to be sure your lungs are clear. If you have an incision (the cut made at the time of surgery), support your incision when coughing by placing a pillow or rolled up towels firmly against it. Once you are able to get out of bed, walk around indoors and cough well. You may stop using the incentive spirometer when instructed by your caregiver.  RISKS AND COMPLICATIONS  Take your time so you do not get dizzy or light-headed.  If you are in pain, you may need to take or ask for pain medication before doing incentive spirometry. It is harder to take a deep breath if you are having pain. AFTER USE  Rest and breathe slowly and easily.  It can be helpful to keep track of a log of your progress. Your caregiver can provide you with a simple table to help with this. If you are using the spirometer at home, follow these instructions: [place] IF:   You are having difficultly using the spirometer.  You have trouble using the spirometer as often as instructed.  Your pain medication is not giving enough relief while using the spirometer.  You develop fever of 100.5 F (38.1 C) or higher. SEEK IMMEDIATE MEDICAL CARE IF:   You cough up bloody sputum that had not been present before.  You develop fever of 102 F (38.9 C) or greater.  You develop worsening pain at or near the incision site. MAKE SURE YOU:   Understand these instructions.  Will watch your condition.  Will get help right away if you are not doing well or get  worse. Document Released: 04/02/2007 Document Revised: 02/12/2012 Document Reviewed: 06/03/2007 [facility] Patient Information 2014 [place], [place].   ________________________________________________________________________

## 2020-06-08 ENCOUNTER — Encounter (HOSPITAL_COMMUNITY): Payer: Self-pay

## 2020-06-08 ENCOUNTER — Encounter (HOSPITAL_COMMUNITY)
Admission: RE | Admit: 2020-06-08 | Discharge: 2020-06-08 | Disposition: A | Discharge status: Home / Self Care | Payer: Commercial Managed Care - PPO | Source: Ambulatory Visit | Attending: Surgery | Admitting: Surgery

## 2020-06-08 ENCOUNTER — Other Ambulatory Visit: Payer: Self-pay

## 2020-06-08 ENCOUNTER — Other Ambulatory Visit (HOSPITAL_COMMUNITY): Payer: Commercial Managed Care - PPO

## 2020-06-08 DIAGNOSIS — Z01818 Encounter for other preprocedural examination: Secondary | ICD-10-CM | POA: Insufficient documentation

## 2020-06-08 DIAGNOSIS — I10 Essential (primary) hypertension: Secondary | ICD-10-CM | POA: Diagnosis not present

## 2020-06-08 HISTORY — DX: Sleep apnea, unspecified: G47.30

## 2020-06-08 HISTORY — DX: Unspecified osteoarthritis, unspecified site: M19.90

## 2020-06-08 HISTORY — DX: Unspecified asthma, uncomplicated: J45.909

## 2020-06-08 LAB — CBC
HCT: 42.6 % (ref 36.0–46.0)
Hemoglobin: 12.9 g/dL (ref 12.0–15.0)
MCH: 28.2 pg (ref 26.0–34.0)
MCHC: 30.3 g/dL (ref 30.0–36.0)
MCV: 93 fL (ref 80.0–100.0)
Platelets: 254 10*3/uL (ref 150–400)
RBC: 4.58 MIL/uL (ref 3.87–5.11)
RDW: 14.1 % (ref 11.5–15.5)
WBC: 5.4 10*3/uL (ref 4.0–10.5)
nRBC: 0 % (ref 0.0–0.2)

## 2020-06-08 LAB — COMPREHENSIVE METABOLIC PANEL
ALT: 12 U/L (ref 0–44)
AST: 14 U/L — ABNORMAL LOW (ref 15–41)
Albumin: 3.6 g/dL (ref 3.5–5.0)
Alkaline Phosphatase: 79 U/L (ref 38–126)
Anion gap: 7 (ref 5–15)
BUN: 8 mg/dL (ref 6–20)
CO2: 25 mmol/L (ref 22–32)
Calcium: 8.4 mg/dL — ABNORMAL LOW (ref 8.9–10.3)
Chloride: 106 mmol/L (ref 98–111)
Creatinine, Ser: 0.83 mg/dL (ref 0.44–1.00)
GFR calc Af Amer: >60 mL/min (ref >60)
GFR calc non Af Amer: >60 mL/min (ref >60)
Glucose, Bld: 96 mg/dL (ref 70–99)
Potassium: 4.5 mmol/L (ref 3.5–5.1)
Sodium: 138 mmol/L (ref 135–145)
Total Bilirubin: 0.4 mg/dL (ref 0.3–1.2)
Total Protein: 6.7 g/dL (ref 6.5–8.1)

## 2020-06-08 LAB — HEMOGLOBIN A1C
Hgb A1c MFr Bld: 5.8 % — ABNORMAL HIGH (ref 4.8–5.6)
Mean Plasma Glucose: 119.76 mg/dL

## 2020-06-08 NOTE — Consult Note (Signed)
[place] Nurse requested for preoperative stoma site marking  Discussed surgical procedure and stoma creation with patient.  Explained role of the [place] nurse team.  Provided the patient with educational booklet and provided samples of pouching options.  Answered patient's questions.   Examined patient sitting, and standing in order to place the marking in the patient's visual field, away from any creases or abdominal contour issues and within the rectus muscle.  Attempted to mark below the patient's belt line, but this was not possible since 2 significant creases appear lower on the abd when she leans forward and should be avoided if possible. .   Marked for colostomy in the LLQ  _6___ cm to the left of the umbilicus and __4__PY above the umbilicus.  Marked for ileostomy in the RLQ  _6___cm to the right of the umbilicus and  _0___ cm above the umbilicus.  Patient's abdomen cleansed with CHG wipes at site markings, allowed to air dry prior to marking.Covered mark with thin film transparent dressing to preserve mark until date of surgery.  Provided with marking pen and instructed to re-color in the location if it faes prior to surgery.   [place] Nurse team will follow up with patient after surgery for continued ostomy care and teaching if she receives an ostomy.  Julien Girt MSN, RN, [place], [place], [place]

## 2020-06-08 NOTE — Progress Notes (Signed)
COVID Vaccine Completed:yes Date COVID Vaccine completed:02/15/20 COVID vaccine manufacturer:  [employer]   PCP - Dr. Irish Lack Cardiologist - no  Chest x-ray - no EKG -06/08/20  Stress Test - no ECHO - no Cardiac Cath - no  Sleep Study - no CPAP -   Fasting Blood Sugar - NA Checks Blood Sugar _____ times a day  Blood Thinner Instructions:NA Aspirin Instructions: Last Dose:  Anesthesia review:   Patient denies shortness of breath, fever, cough and chest pain at PAT appointment  yes   Patient verbalized understanding of instructions that were given to them at the PAT appointment. Patient was also instructed that they will need to review over the PAT instructions again at home before surgery.  Yes  Pt takes clonidine for anxiety. Her BP runs low. 90/60 at PAT appt.

## 2020-06-12 ENCOUNTER — Other Ambulatory Visit (HOSPITAL_COMMUNITY)
Admit: 2020-06-12 | Discharge: 2020-06-12 | Disposition: A | Discharge status: Home / Self Care | Payer: Commercial Managed Care - PPO | Attending: Surgery | Admitting: Surgery

## 2020-06-12 DIAGNOSIS — Z01818 Encounter for other preprocedural examination: Secondary | ICD-10-CM | POA: Insufficient documentation

## 2020-06-12 DIAGNOSIS — Z20822 Contact with and (suspected) exposure to covid-19: Secondary | ICD-10-CM | POA: Diagnosis not present

## 2020-06-12 LAB — SARS CORONAVIRUS 2 (TAT 6-24 HRS): SARS Coronavirus 2: NEGATIVE

## 2020-06-15 MED ORDER — BUPIVACAINE LIPOSOME 1.3 % IJ SUSP
20.0000 mL | Freq: Once | INTRAMUSCULAR | Status: DC
  Filled 2020-06-15: qty 20

## 2020-06-16 ENCOUNTER — Encounter (HOSPITAL_COMMUNITY)
Admission: RE | Disposition: A | Discharge status: Home / Self Care | Payer: Self-pay | Source: Home / Self Care | Attending: Surgery

## 2020-06-16 ENCOUNTER — Encounter (HOSPITAL_COMMUNITY): Payer: Self-pay | Admitting: Surgery

## 2020-06-16 ENCOUNTER — Inpatient Hospital Stay (HOSPITAL_COMMUNITY)
Admission: RE | Admit: 2020-06-16 | Discharge: 2020-06-19 | DRG: 330 | Disposition: A | Discharge status: Home / Self Care | Payer: Commercial Managed Care - PPO | Attending: Surgery | Admitting: Surgery

## 2020-06-16 ENCOUNTER — Inpatient Hospital Stay (HOSPITAL_COMMUNITY): Payer: Commercial Managed Care - PPO | Admitting: Certified Registered Nurse Anesthetist

## 2020-06-16 ENCOUNTER — Other Ambulatory Visit: Payer: Self-pay

## 2020-06-16 DIAGNOSIS — K632 Fistula of intestine: Secondary | ICD-10-CM | POA: Diagnosis present

## 2020-06-16 DIAGNOSIS — Z862 Personal history of diseases of the blood and blood-forming organs and certain disorders involving the immune mechanism: Secondary | ICD-10-CM

## 2020-06-16 DIAGNOSIS — J45909 Unspecified asthma, uncomplicated: Secondary | ICD-10-CM | POA: Diagnosis present

## 2020-06-16 DIAGNOSIS — K5720 Diverticulitis of large intestine with perforation and abscess without bleeding: Principal | ICD-10-CM | POA: Diagnosis present

## 2020-06-16 DIAGNOSIS — Z885 Allergy status to narcotic agent status: Secondary | ICD-10-CM | POA: Diagnosis not present

## 2020-06-16 DIAGNOSIS — Z87891 Personal history of nicotine dependence: Secondary | ICD-10-CM

## 2020-06-16 DIAGNOSIS — L930 Discoid lupus erythematosus: Secondary | ICD-10-CM | POA: Diagnosis present

## 2020-06-16 DIAGNOSIS — G4700 Insomnia, unspecified: Secondary | ICD-10-CM

## 2020-06-16 DIAGNOSIS — G43909 Migraine, unspecified, not intractable, without status migrainosus: Secondary | ICD-10-CM | POA: Diagnosis present

## 2020-06-16 DIAGNOSIS — I10 Essential (primary) hypertension: Secondary | ICD-10-CM | POA: Diagnosis present

## 2020-06-16 DIAGNOSIS — F900 Attention-deficit hyperactivity disorder, predominantly inattentive type: Secondary | ICD-10-CM | POA: Diagnosis present

## 2020-06-16 DIAGNOSIS — M545 Low back pain: Secondary | ICD-10-CM | POA: Diagnosis present

## 2020-06-16 DIAGNOSIS — K219 Gastro-esophageal reflux disease without esophagitis: Secondary | ICD-10-CM | POA: Diagnosis present

## 2020-06-16 DIAGNOSIS — F1021 Alcohol dependence, in remission: Secondary | ICD-10-CM

## 2020-06-16 DIAGNOSIS — F411 Generalized anxiety disorder: Secondary | ICD-10-CM | POA: Diagnosis present

## 2020-06-16 DIAGNOSIS — F329 Major depressive disorder, single episode, unspecified: Secondary | ICD-10-CM | POA: Diagnosis present

## 2020-06-16 DIAGNOSIS — Z20822 Contact with and (suspected) exposure to covid-19: Secondary | ICD-10-CM | POA: Diagnosis present

## 2020-06-16 DIAGNOSIS — G8929 Other chronic pain: Secondary | ICD-10-CM | POA: Diagnosis present

## 2020-06-16 DIAGNOSIS — Z79899 Other long term (current) drug therapy: Secondary | ICD-10-CM | POA: Diagnosis not present

## 2020-06-16 DIAGNOSIS — M47812 Spondylosis without myelopathy or radiculopathy, cervical region: Secondary | ICD-10-CM | POA: Diagnosis present

## 2020-06-16 DIAGNOSIS — N393 Stress incontinence (female) (male): Secondary | ICD-10-CM | POA: Diagnosis present

## 2020-06-16 DIAGNOSIS — G4730 Sleep apnea, unspecified: Secondary | ICD-10-CM | POA: Diagnosis present

## 2020-06-16 HISTORY — PX: CYSTOSCOPY WITH STENT PLACEMENT: SHX5790

## 2020-06-16 HISTORY — PX: PROCTOSCOPY: SHX2266

## 2020-06-16 LAB — TYPE AND SCREEN
ABO/RH(D): O POS
Antibody Screen: NEGATIVE

## 2020-06-16 LAB — ABO/RH: ABO/RH(D): O POS

## 2020-06-16 SURGERY — COLECTOMY, SIGMOID, ROBOT-ASSISTED
Anesthesia: General | Site: Ureter

## 2020-06-16 MED ORDER — NEOMYCIN SULFATE 500 MG PO TABS
1000.0000 mg | ORAL_TABLET | ORAL | Status: DC
  Filled 2020-06-16: qty 2

## 2020-06-16 MED ORDER — PROPOFOL 10 MG/ML IV BOLUS
INTRAVENOUS | Status: AC
  Filled 2020-06-16: qty 20

## 2020-06-16 MED ORDER — FLUTICASONE PROPIONATE 50 MCG/ACT NA SUSP
2.0000 | Freq: Every day | NASAL | Status: DC
  Administered 2020-06-17 – 2020-06-19 (×3): 2 via NASAL
  Filled 2020-06-16: qty 16

## 2020-06-16 MED ORDER — ALVIMOPAN 12 MG PO CAPS
12.0000 mg | ORAL_CAPSULE | Freq: Two times a day (BID) | ORAL | Status: DC
  Administered 2020-06-17: 12 mg via ORAL
  Filled 2020-06-16 (×2): qty 1

## 2020-06-16 MED ORDER — HYDROXYZINE PAMOATE 25 MG PO CAPS
25.0000 mg | ORAL_CAPSULE | Freq: Two times a day (BID) | ORAL | Status: DC | PRN
  Filled 2020-06-16: qty 1

## 2020-06-16 MED ORDER — DIPHENHYDRAMINE HCL 50 MG/ML IJ SOLN
12.5000 mg | Freq: Four times a day (QID) | INTRAMUSCULAR | Status: DC | PRN
  Administered 2020-06-18: 12.5 mg via INTRAVENOUS
  Filled 2020-06-16: qty 1

## 2020-06-16 MED ORDER — SODIUM CHLORIDE 0.9 % IV SOLN
2.0000 g | Freq: Two times a day (BID) | INTRAVENOUS | Status: AC
  Administered 2020-06-16: 2 g via INTRAVENOUS
  Filled 2020-06-16: qty 2

## 2020-06-16 MED ORDER — ACETAMINOPHEN 500 MG PO TABS
1000.0000 mg | ORAL_TABLET | ORAL | Status: AC
  Administered 2020-06-16: 1000 mg via ORAL
  Filled 2020-06-16: qty 2

## 2020-06-16 MED ORDER — LACTATED RINGERS IV SOLN: INTRAVENOUS | Status: DC

## 2020-06-16 MED ORDER — ONDANSETRON HCL 4 MG/2ML IJ SOLN
INTRAMUSCULAR | Status: DC | PRN
  Administered 2020-06-16: 4 mg via INTRAVENOUS

## 2020-06-16 MED ORDER — GABAPENTIN 300 MG PO CAPS
900.0000 mg | ORAL_CAPSULE | Freq: Every day | ORAL | Status: DC
  Administered 2020-06-16 – 2020-06-17 (×2): 900 mg via ORAL
  Filled 2020-06-16 (×2): qty 3

## 2020-06-16 MED ORDER — PROGESTERONE MICRONIZED 100 MG PO CAPS
200.0000 mg | ORAL_CAPSULE | Freq: Every day | ORAL | Status: DC
  Administered 2020-06-17 – 2020-06-19 (×3): 200 mg via ORAL
  Filled 2020-06-16 (×3): qty 2

## 2020-06-16 MED ORDER — STERILE WATER FOR INJECTION IJ SOLN
INTRAMUSCULAR | Status: AC
  Filled 2020-06-16: qty 10

## 2020-06-16 MED ORDER — FLUOXETINE HCL 20 MG PO CAPS
60.0000 mg | ORAL_CAPSULE | Freq: Every day | ORAL | Status: DC
  Filled 2020-06-16: qty 3

## 2020-06-16 MED ORDER — N-ACETYL-L-CYSTEINE 600 MG PO CAPS: 600.0000 mg | ORAL_CAPSULE | Freq: Three times a day (TID) | ORAL | Status: DC

## 2020-06-16 MED ORDER — PROCHLORPERAZINE EDISYLATE 10 MG/2ML IJ SOLN: 5.0000 mg | Freq: Four times a day (QID) | INTRAMUSCULAR | Status: DC | PRN

## 2020-06-16 MED ORDER — GABAPENTIN 300 MG PO CAPS: 300.0000 mg | ORAL_CAPSULE | ORAL | Status: DC

## 2020-06-16 MED ORDER — ENSURE PRE-SURGERY PO LIQD
592.0000 mL | Freq: Once | ORAL | Status: AC
  Administered 2020-06-16: 592 mL via ORAL
  Filled 2020-06-16: qty 592

## 2020-06-16 MED ORDER — CLONIDINE HCL 0.1 MG PO TABS
0.1000 mg | ORAL_TABLET | Freq: Four times a day (QID) | ORAL | Status: DC
  Administered 2020-06-16 – 2020-06-19 (×3): 0.1 mg via ORAL
  Filled 2020-06-16 (×6): qty 1

## 2020-06-16 MED ORDER — ALUM & MAG HYDROXIDE-SIMETH 200-200-20 MG/5ML PO SUSP: 30.0000 mL | Freq: Four times a day (QID) | ORAL | Status: DC | PRN

## 2020-06-16 MED ORDER — ORAL CARE MOUTH RINSE: 15.0000 mL | Freq: Once | OROMUCOSAL | Status: AC

## 2020-06-16 MED ORDER — IPRATROPIUM-ALBUTEROL 0.5-2.5 (3) MG/3ML IN SOLN: 3.0000 mL | Freq: Four times a day (QID) | RESPIRATORY_TRACT | Status: DC | PRN

## 2020-06-16 MED ORDER — BUDESON-GLYCOPYRROL-FORMOTEROL 160-9-4.8 MCG/ACT IN AERO: 1.0000 | INHALATION_SPRAY | Freq: Two times a day (BID) | RESPIRATORY_TRACT | Status: DC

## 2020-06-16 MED ORDER — PHENYLEPHRINE HCL-NACL 10-0.9 MG/250ML-% IV SOLN
INTRAVENOUS | Status: DC | PRN
Start: 2020-06-16 — End: 2020-06-16
  Administered 2020-06-16: 20 ug/min via INTRAVENOUS

## 2020-06-16 MED ORDER — ENALAPRILAT 1.25 MG/ML IV SOLN
0.6250 mg | Freq: Four times a day (QID) | INTRAVENOUS | Status: DC | PRN
  Filled 2020-06-16: qty 1

## 2020-06-16 MED ORDER — HYDROMORPHONE HCL 1 MG/ML IJ SOLN
0.5000 mg | INTRAMUSCULAR | Status: DC | PRN
  Administered 2020-06-16: 1 mg via INTRAVENOUS
  Administered 2020-06-16: 2 mg via INTRAVENOUS
  Administered 2020-06-17 – 2020-06-18 (×6): 1 mg via INTRAVENOUS
  Filled 2020-06-16: qty 1
  Filled 2020-06-16: qty 2
  Filled 2020-06-16 (×6): qty 1

## 2020-06-16 MED ORDER — PROPOFOL 10 MG/ML IV BOLUS
INTRAVENOUS | Status: DC | PRN
  Administered 2020-06-16: 150 mg via INTRAVENOUS
  Administered 2020-06-16: 50 mg via INTRAVENOUS
  Administered 2020-06-16: 30 mg via INTRAVENOUS

## 2020-06-16 MED ORDER — LIDOCAINE 2% (20 MG/ML) 5 ML SYRINGE
INTRAMUSCULAR | Status: DC | PRN
  Administered 2020-06-16: 60 mg via INTRAVENOUS

## 2020-06-16 MED ORDER — LIP MEDEX EX OINT
1.0000 "application " | TOPICAL_OINTMENT | Freq: Two times a day (BID) | CUTANEOUS | Status: DC
  Administered 2020-06-16 – 2020-06-19 (×6): 1 via TOPICAL
  Filled 2020-06-16: qty 7

## 2020-06-16 MED ORDER — FENTANYL CITRATE (PF) 100 MCG/2ML IJ SOLN
INTRAMUSCULAR | Status: AC
  Filled 2020-06-16: qty 2

## 2020-06-16 MED ORDER — ONDANSETRON HCL 4 MG/2ML IJ SOLN
INTRAMUSCULAR | Status: AC
  Filled 2020-06-16: qty 2

## 2020-06-16 MED ORDER — SUGAMMADEX SODIUM 200 MG/2ML IV SOLN
INTRAVENOUS | Status: DC | PRN
  Administered 2020-06-16: 200 mg via INTRAVENOUS

## 2020-06-16 MED ORDER — LORAZEPAM 2 MG/ML IJ SOLN
0.5000 mg | Freq: Three times a day (TID) | INTRAMUSCULAR | Status: DC | PRN
  Administered 2020-06-18: 1 mg via INTRAVENOUS
  Filled 2020-06-16: qty 1

## 2020-06-16 MED ORDER — FENTANYL CITRATE (PF) 250 MCG/5ML IJ SOLN
INTRAMUSCULAR | Status: AC
  Filled 2020-06-16: qty 5

## 2020-06-16 MED ORDER — MAGIC MOUTHWASH
15.0000 mL | Freq: Four times a day (QID) | ORAL | Status: DC | PRN
  Filled 2020-06-16: qty 15

## 2020-06-16 MED ORDER — CELECOXIB 200 MG PO CAPS
200.0000 mg | ORAL_CAPSULE | ORAL | Status: AC
  Administered 2020-06-16: 200 mg via ORAL
  Filled 2020-06-16: qty 1

## 2020-06-16 MED ORDER — GABAPENTIN 100 MG PO CAPS
200.0000 mg | ORAL_CAPSULE | Freq: Three times a day (TID) | ORAL | Status: DC
  Administered 2020-06-17 (×2): 200 mg via ORAL
  Filled 2020-06-16 (×4): qty 2

## 2020-06-16 MED ORDER — ROCURONIUM BROMIDE 10 MG/ML (PF) SYRINGE
PREFILLED_SYRINGE | INTRAVENOUS | Status: DC | PRN
  Administered 2020-06-16: 60 mg via INTRAVENOUS
  Administered 2020-06-16: 10 mg via INTRAVENOUS
  Administered 2020-06-16: 20 mg via INTRAVENOUS

## 2020-06-16 MED ORDER — ADULT MULTIVITAMIN W/MINERALS CH
1.0000 | ORAL_TABLET | Freq: Every day | ORAL | Status: DC
  Administered 2020-06-17 – 2020-06-19 (×3): 1 via ORAL
  Filled 2020-06-16 (×3): qty 1

## 2020-06-16 MED ORDER — FLUTICASONE FUROATE-VILANTEROL 100-25 MCG/INH IN AEPB
1.0000 | INHALATION_SPRAY | Freq: Every day | RESPIRATORY_TRACT | Status: DC
  Administered 2020-06-17 – 2020-06-18 (×2): 1 via RESPIRATORY_TRACT
  Filled 2020-06-16: qty 28

## 2020-06-16 MED ORDER — SODIUM CHLORIDE 0.9 % IV SOLN: Freq: Three times a day (TID) | INTRAVENOUS | Status: AC | PRN

## 2020-06-16 MED ORDER — ONDANSETRON 4 MG PO TBDP: 4.0000 mg | ORAL_TABLET | Freq: Four times a day (QID) | ORAL | Status: DC | PRN

## 2020-06-16 MED ORDER — FENTANYL CITRATE (PF) 100 MCG/2ML IJ SOLN
INTRAMUSCULAR | Status: DC | PRN
  Administered 2020-06-16 (×3): 50 ug via INTRAVENOUS
  Administered 2020-06-16: 100 ug via INTRAVENOUS
  Administered 2020-06-16 (×3): 50 ug via INTRAVENOUS

## 2020-06-16 MED ORDER — TRAZODONE HCL 100 MG PO TABS
100.0000 mg | ORAL_TABLET | Freq: Every evening | ORAL | Status: DC | PRN
  Administered 2020-06-16 – 2020-06-18 (×3): 100 mg via ORAL
  Filled 2020-06-16 (×3): qty 1

## 2020-06-16 MED ORDER — OXYCODONE HCL 5 MG/5ML PO SOLN: 5.0000 mg | Freq: Once | ORAL | Status: DC | PRN

## 2020-06-16 MED ORDER — BISACODYL 5 MG PO TBEC
20.0000 mg | DELAYED_RELEASE_TABLET | Freq: Once | ORAL | Status: AC
  Administered 2020-06-16: 20 mg via ORAL

## 2020-06-16 MED ORDER — VITAMIN D (ERGOCALCIFEROL) 1.25 MG (50000 UNIT) PO CAPS
50000.0000 [IU] | ORAL_CAPSULE | ORAL | Status: DC
  Administered 2020-06-18: 50000 [IU] via ORAL
  Filled 2020-06-16: qty 1

## 2020-06-16 MED ORDER — ENSURE SURGERY PO LIQD
237.0000 mL | Freq: Two times a day (BID) | ORAL | Status: DC
  Administered 2020-06-18 (×2): 237 mL via ORAL
  Filled 2020-06-16 (×6): qty 237

## 2020-06-16 MED ORDER — BUDESONIDE 0.5 MG/2ML IN SUSP: 2.0000 mL | Freq: Every day | RESPIRATORY_TRACT | Status: DC | PRN

## 2020-06-16 MED ORDER — INDOCYANINE GREEN 25 MG IV SOLR
INTRAVENOUS | Status: DC | PRN
Start: 2020-06-16 — End: 2020-06-16
  Administered 2020-06-16: 6.25 mg via INTRAVENOUS

## 2020-06-16 MED ORDER — SCOPOLAMINE 1 MG/3DAYS TD PT72
1.0000 | MEDICATED_PATCH | Freq: Once | TRANSDERMAL | Status: DC
  Administered 2020-06-16: 1.5 mg via TRANSDERMAL
  Filled 2020-06-16: qty 1

## 2020-06-16 MED ORDER — SODIUM CHLORIDE 0.9 % IV SOLN: INTRAVENOUS | Status: DC | PRN

## 2020-06-16 MED ORDER — ONDANSETRON HCL 4 MG/2ML IJ SOLN: 4.0000 mg | Freq: Four times a day (QID) | INTRAMUSCULAR | Status: DC | PRN

## 2020-06-16 MED ORDER — ACETAMINOPHEN 500 MG PO TABS
1000.0000 mg | ORAL_TABLET | Freq: Four times a day (QID) | ORAL | Status: DC
  Administered 2020-06-16 – 2020-06-19 (×10): 1000 mg via ORAL
  Filled 2020-06-16 (×12): qty 2

## 2020-06-16 MED ORDER — GABAPENTIN 300 MG PO CAPS
300.0000 mg | ORAL_CAPSULE | ORAL | Status: AC
  Administered 2020-06-16: 300 mg via ORAL
  Filled 2020-06-16: qty 1

## 2020-06-16 MED ORDER — LIDOCAINE 2% (20 MG/ML) 5 ML SYRINGE
INTRAMUSCULAR | Status: AC
  Filled 2020-06-16: qty 5

## 2020-06-16 MED ORDER — GLYCOPYRROLATE PF 0.2 MG/ML IJ SOSY
PREFILLED_SYRINGE | INTRAMUSCULAR | Status: AC
  Filled 2020-06-16: qty 1

## 2020-06-16 MED ORDER — LACTATED RINGERS IR SOLN
Status: DC | PRN
  Administered 2020-06-16 (×2): 1000 mL

## 2020-06-16 MED ORDER — UMECLIDINIUM BROMIDE 62.5 MCG/INH IN AEPB
1.0000 | INHALATION_SPRAY | Freq: Every day | RESPIRATORY_TRACT | Status: DC
  Administered 2020-06-17 – 2020-06-18 (×2): 1 via RESPIRATORY_TRACT
  Filled 2020-06-16: qty 7

## 2020-06-16 MED ORDER — SUMATRIPTAN SUCCINATE 50 MG PO TABS
100.0000 mg | ORAL_TABLET | Freq: Every day | ORAL | Status: DC | PRN
  Filled 2020-06-16: qty 2

## 2020-06-16 MED ORDER — SODIUM CHLORIDE 0.9 % IV SOLN
2.0000 g | INTRAVENOUS | Status: AC
  Administered 2020-06-16: 2 g via INTRAVENOUS
  Filled 2020-06-16: qty 2

## 2020-06-16 MED ORDER — OXYCODONE HCL 5 MG PO TABS
5.0000 mg | ORAL_TABLET | Freq: Four times a day (QID) | ORAL | Status: DC | PRN
  Administered 2020-06-17 – 2020-06-18 (×3): 10 mg via ORAL
  Filled 2020-06-16 (×3): qty 2

## 2020-06-16 MED ORDER — ENOXAPARIN SODIUM 40 MG/0.4ML ~~LOC~~ SOLN
40.0000 mg | Freq: Once | SUBCUTANEOUS | Status: AC
  Administered 2020-06-16: 40 mg via SUBCUTANEOUS
  Filled 2020-06-16: qty 0.4

## 2020-06-16 MED ORDER — SODIUM CHLORIDE 0.9 % IR SOLN
Status: DC | PRN
  Administered 2020-06-16: 1000 mL

## 2020-06-16 MED ORDER — GLYCOPYRROLATE PF 0.2 MG/ML IJ SOSY
PREFILLED_SYRINGE | INTRAMUSCULAR | Status: DC | PRN
  Administered 2020-06-16: .2 mg via INTRAVENOUS

## 2020-06-16 MED ORDER — HYDROMORPHONE HCL 1 MG/ML IJ SOLN
INTRAMUSCULAR | Status: DC | PRN
  Administered 2020-06-16: 1 mg via INTRAVENOUS
  Administered 2020-06-16 (×2): .5 mg via INTRAVENOUS

## 2020-06-16 MED ORDER — HYDROMORPHONE HCL 1 MG/ML IJ SOLN: 0.2500 mg | INTRAMUSCULAR | Status: DC | PRN

## 2020-06-16 MED ORDER — TOPIRAMATE 25 MG PO TABS
50.0000 mg | ORAL_TABLET | Freq: Every day | ORAL | Status: DC
  Administered 2020-06-17 – 2020-06-19 (×3): 50 mg via ORAL
  Filled 2020-06-16 (×3): qty 2

## 2020-06-16 MED ORDER — ENSURE PRE-SURGERY PO LIQD
296.0000 mL | Freq: Once | ORAL | Status: DC
  Filled 2020-06-16: qty 296

## 2020-06-16 MED ORDER — DIPHENHYDRAMINE HCL 12.5 MG/5ML PO ELIX: 12.5000 mg | ORAL_SOLUTION | Freq: Four times a day (QID) | ORAL | Status: DC | PRN

## 2020-06-16 MED ORDER — ROCURONIUM BROMIDE 10 MG/ML (PF) SYRINGE
PREFILLED_SYRINGE | INTRAVENOUS | Status: AC
  Filled 2020-06-16: qty 10

## 2020-06-16 MED ORDER — 0.9 % SODIUM CHLORIDE (POUR BTL) OPTIME
TOPICAL | Status: DC | PRN
  Administered 2020-06-16: 2000 mL

## 2020-06-16 MED ORDER — METRONIDAZOLE 500 MG PO TABS
1000.0000 mg | ORAL_TABLET | ORAL | Status: DC
  Filled 2020-06-16: qty 2

## 2020-06-16 MED ORDER — GABAPENTIN 300 MG PO CAPS
300.0000 mg | ORAL_CAPSULE | Freq: Three times a day (TID) | ORAL | Status: DC
  Administered 2020-06-16 – 2020-06-17 (×4): 300 mg via ORAL
  Filled 2020-06-16 (×4): qty 1

## 2020-06-16 MED ORDER — BUPIVACAINE-EPINEPHRINE (PF) 0.25% -1:200000 IJ SOLN
INTRAMUSCULAR | Status: DC | PRN
  Administered 2020-06-16: 50 mL

## 2020-06-16 MED ORDER — PHENYLEPHRINE HCL (PRESSORS) 10 MG/ML IV SOLN
INTRAVENOUS | Status: AC
  Filled 2020-06-16: qty 1

## 2020-06-16 MED ORDER — CHLORHEXIDINE GLUCONATE 0.12 % MT SOLN
15.0000 mL | Freq: Once | OROMUCOSAL | Status: AC
  Administered 2020-06-16: 15 mL via OROMUCOSAL

## 2020-06-16 MED ORDER — ALBUTEROL SULFATE (2.5 MG/3ML) 0.083% IN NEBU
2.5000 mg | INHALATION_SOLUTION | RESPIRATORY_TRACT | Status: DC | PRN
  Administered 2020-06-16: 2.5 mg via RESPIRATORY_TRACT
  Filled 2020-06-16: qty 3

## 2020-06-16 MED ORDER — OXYCODONE HCL 5 MG PO TABS: 5.0000 mg | ORAL_TABLET | Freq: Once | ORAL | Status: DC | PRN

## 2020-06-16 MED ORDER — 0.9 % SODIUM CHLORIDE (POUR BTL) OPTIME
TOPICAL | Status: DC | PRN
  Administered 2020-06-16: 1000 mL

## 2020-06-16 MED ORDER — ALBUTEROL SULFATE HFA 108 (90 BASE) MCG/ACT IN AERS: 2.0000 | INHALATION_SPRAY | RESPIRATORY_TRACT | Status: DC | PRN

## 2020-06-16 MED ORDER — HYDROMORPHONE HCL 2 MG/ML IJ SOLN
INTRAMUSCULAR | Status: AC
  Filled 2020-06-16: qty 1

## 2020-06-16 MED ORDER — PROCHLORPERAZINE MALEATE 10 MG PO TABS
10.0000 mg | ORAL_TABLET | Freq: Four times a day (QID) | ORAL | Status: DC | PRN
  Filled 2020-06-16: qty 1

## 2020-06-16 MED ORDER — BUPIVACAINE LIPOSOME 1.3 % IJ SUSP
INTRAMUSCULAR | Status: DC | PRN
  Administered 2020-06-16: 20 mL

## 2020-06-16 MED ORDER — KETAMINE HCL 10 MG/ML IJ SOLN
INTRAMUSCULAR | Status: DC | PRN
Start: 2020-06-16 — End: 2020-06-16
  Administered 2020-06-16: 20 mg via INTRAVENOUS
  Administered 2020-06-16: 30 mg via INTRAVENOUS

## 2020-06-16 MED ORDER — ENOXAPARIN SODIUM 40 MG/0.4ML ~~LOC~~ SOLN
40.0000 mg | SUBCUTANEOUS | Status: DC
  Administered 2020-06-17 – 2020-06-19 (×3): 40 mg via SUBCUTANEOUS
  Filled 2020-06-16 (×3): qty 0.4

## 2020-06-16 MED ORDER — METOPROLOL TARTRATE 5 MG/5ML IV SOLN: 5.0000 mg | Freq: Four times a day (QID) | INTRAVENOUS | Status: DC | PRN

## 2020-06-16 MED ORDER — DEXAMETHASONE SODIUM PHOSPHATE 10 MG/ML IJ SOLN
INTRAMUSCULAR | Status: DC | PRN
  Administered 2020-06-16: 6 mg via INTRAVENOUS

## 2020-06-16 MED ORDER — MIDAZOLAM HCL 2 MG/2ML IJ SOLN
INTRAMUSCULAR | Status: AC
  Filled 2020-06-16: qty 2

## 2020-06-16 MED ORDER — TURMERIC 500 MG PO CAPS: 500.0000 mg | ORAL_CAPSULE | Freq: Every day | ORAL | Status: DC

## 2020-06-16 MED ORDER — PROMETHAZINE HCL 25 MG/ML IJ SOLN: 6.2500 mg | INTRAMUSCULAR | Status: DC | PRN

## 2020-06-16 MED ORDER — ALVIMOPAN 12 MG PO CAPS
12.0000 mg | ORAL_CAPSULE | ORAL | Status: AC
  Administered 2020-06-16: 12 mg via ORAL
  Filled 2020-06-16: qty 1

## 2020-06-16 MED ORDER — MIDAZOLAM HCL 5 MG/5ML IJ SOLN
INTRAMUSCULAR | Status: DC | PRN
  Administered 2020-06-16 (×2): 1 mg via INTRAVENOUS

## 2020-06-16 MED ORDER — ONDANSETRON HCL 4 MG PO TABS: 4.0000 mg | ORAL_TABLET | Freq: Four times a day (QID) | ORAL | Status: DC | PRN

## 2020-06-16 MED ORDER — AMPHETAMINE ER 12.5 MG PO TBED: 25.0000 mg | EXTENDED_RELEASE_TABLET | Freq: Every day | ORAL | Status: DC

## 2020-06-16 SURGICAL SUPPLY — 116 items
ADAPTER GOLDBERG URETERAL (ADAPTER) ×1 IMPLANT
APPLIER CLIP 5 13 M/L LIGAMAX5 (MISCELLANEOUS)
APPLIER CLIP ROT 10 11.4 M/L (STAPLE)
BAG URO CATCHER STRL LF (MISCELLANEOUS) ×4 IMPLANT
BLADE EXTENDED COATED 6.5IN (ELECTRODE) IMPLANT
CANNULA REDUC XI 12-8 STAPL (CANNULA)
CANNULA REDUCER 12-8 DVNC XI (CANNULA) IMPLANT
CATH FOLEY 3WAY  5CC 16FR (CATHETERS) ×4
CATH FOLEY 3WAY 5CC 16FR (CATHETERS) IMPLANT
CATH INTERMIT  6FR 70CM (CATHETERS) ×5 IMPLANT
CELLS DAT CNTRL 66122 CELL SVR (MISCELLANEOUS) IMPLANT
CHLORAPREP W/TINT 26 (MISCELLANEOUS) ×1 IMPLANT
CLIP APPLIE 5 13 M/L LIGAMAX5 (MISCELLANEOUS) IMPLANT
CLIP APPLIE ROT 10 11.4 M/L (STAPLE) IMPLANT
CLOTH BEACON ORANGE TIMEOUT ST (SAFETY) ×4 IMPLANT
COVER SURGICAL LIGHT HANDLE (MISCELLANEOUS) ×8 IMPLANT
COVER TIP SHEARS 8 DVNC (MISCELLANEOUS) ×3 IMPLANT
COVER TIP SHEARS 8MM DA VINCI (MISCELLANEOUS) ×4
COVER WAND RF STERILE (DRAPES) ×4 IMPLANT
DECANTER SPIKE VIAL GLASS SM (MISCELLANEOUS) ×4 IMPLANT
DEVICE TROCAR PUNCTURE CLOSURE (ENDOMECHANICALS) IMPLANT
DRAIN CHANNEL 19F RND (DRAIN) ×1 IMPLANT
DRAPE ARM DVNC X/XI (DISPOSABLE) ×12 IMPLANT
DRAPE COLUMN DVNC XI (DISPOSABLE) ×3 IMPLANT
DRAPE DA VINCI XI ARM (DISPOSABLE) ×16
DRAPE DA VINCI XI COLUMN (DISPOSABLE) ×4
DRAPE SURG IRRIG POUCH 19X23 (DRAPES) ×4 IMPLANT
DRSG COVADERM PLUS 2X2 (GAUZE/BANDAGES/DRESSINGS) ×1 IMPLANT
DRSG OPSITE POSTOP 4X10 (GAUZE/BANDAGES/DRESSINGS) IMPLANT
DRSG OPSITE POSTOP 4X6 (GAUZE/BANDAGES/DRESSINGS) ×1 IMPLANT
DRSG OPSITE POSTOP 4X8 (GAUZE/BANDAGES/DRESSINGS) ×1 IMPLANT
DRSG TEGADERM 2-3/8X2-3/4 SM (GAUZE/BANDAGES/DRESSINGS) ×16 IMPLANT
DRSG TEGADERM 4X4.75 (GAUZE/BANDAGES/DRESSINGS) IMPLANT
ELECT PENCIL ROCKER SW 15FT (MISCELLANEOUS) ×4 IMPLANT
ELECT REM PT RETURN 15FT ADLT (MISCELLANEOUS) ×5 IMPLANT
ENDOLOOP SUT PDS II  0 18 (SUTURE)
ENDOLOOP SUT PDS II 0 18 (SUTURE) IMPLANT
EVACUATOR SILICONE 100CC (DRAIN) ×1 IMPLANT
GAUZE SPONGE 2X2 8PLY STRL LF (GAUZE/BANDAGES/DRESSINGS) ×3 IMPLANT
GLOVE BIO SURGEON STRL SZ7.5 (GLOVE) ×4 IMPLANT
GLOVE ECLIPSE 8.0 STRL XLNG CF (GLOVE) ×12 IMPLANT
GLOVE INDICATOR 8.0 STRL GRN (GLOVE) ×12 IMPLANT
GOWN STRL REUS W/TWL LRG LVL3 (GOWN DISPOSABLE) ×8 IMPLANT
GOWN STRL REUS W/TWL XL LVL3 (GOWN DISPOSABLE) ×16 IMPLANT
GRASPER SUT TROCAR 14GX15 (MISCELLANEOUS) IMPLANT
GUIDEWIRE STR DUAL SENSOR (WIRE) ×4 IMPLANT
HOLDER FOLEY CATH W/STRAP (MISCELLANEOUS) ×4 IMPLANT
IRRIG SUCT STRYKERFLOW 2 WTIP (MISCELLANEOUS) ×4
IRRIGATION SUCT STRKRFLW 2 WTP (MISCELLANEOUS) ×3 IMPLANT
KIT PROCEDURE DA VINCI SI (MISCELLANEOUS) ×4
KIT PROCEDURE DVNC SI (MISCELLANEOUS) IMPLANT
KIT SIGMOIDOSCOPE (SET/KITS/TRAYS/PACK) IMPLANT
KIT TURNOVER KIT A (KITS) IMPLANT
MANIFOLD NEPTUNE II (INSTRUMENTS) ×4 IMPLANT
NDL INSUFFLATION 14GA 120MM (NEEDLE) ×3 IMPLANT
NEEDLE INSUFFLATION 14GA 120MM (NEEDLE) ×4 IMPLANT
PACK CARDIOVASCULAR III (CUSTOM PROCEDURE TRAY) ×4 IMPLANT
PACK COLON (CUSTOM PROCEDURE TRAY) ×4 IMPLANT
PACK CYSTO (CUSTOM PROCEDURE TRAY) ×4 IMPLANT
PAD POSITIONING PINK XL (MISCELLANEOUS) ×4 IMPLANT
PORT LAP GEL ALEXIS MED 5-9CM (MISCELLANEOUS) ×4 IMPLANT
PROTECTOR NERVE ULNAR (MISCELLANEOUS) ×8 IMPLANT
RELOAD STAPLE 45 BLU REG DVNC (STAPLE) IMPLANT
RELOAD STAPLE 45 GRN THCK DVNC (STAPLE) IMPLANT
RELOAD STAPLE 60 4.3 GRN DVNC (STAPLE) IMPLANT
RELOAD STAPLER 4.3X60 GRN DVNC (STAPLE) IMPLANT
RETRACTOR WND ALEXIS 18 MED (MISCELLANEOUS) IMPLANT
RTRCTR WOUND ALEXIS 18CM MED (MISCELLANEOUS)
SCISSORS LAP 5X35 DISP (ENDOMECHANICALS) ×4 IMPLANT
SEAL CANN UNIV 5-8 DVNC XI (MISCELLANEOUS) ×9 IMPLANT
SEAL XI 5MM-8MM UNIVERSAL (MISCELLANEOUS) ×16
SEALER VESSEL DA VINCI XI (MISCELLANEOUS) ×4
SEALER VESSEL EXT DVNC XI (MISCELLANEOUS) ×3 IMPLANT
SOLUTION ELECTROLUBE (MISCELLANEOUS) ×4 IMPLANT
SPONGE GAUZE 2X2 STER 10/PKG (GAUZE/BANDAGES/DRESSINGS) ×1
STAPLER 45 BLU RELOAD XI (STAPLE) IMPLANT
STAPLER 45 BLUE RELOAD XI (STAPLE)
STAPLER 45 DA VINCI SURE FORM (STAPLE)
STAPLER 45 GREEN RELOAD XI (STAPLE)
STAPLER 45 GRN RELOAD XI (STAPLE) IMPLANT
STAPLER 45 SUREFORM DVNC (STAPLE) IMPLANT
STAPLER 60 DA VINCI SURE FORM (STAPLE)
STAPLER 60 SUREFORM DVNC (STAPLE) IMPLANT
STAPLER CANNULA SEAL DVNC XI (STAPLE) ×3 IMPLANT
STAPLER CANNULA SEAL XI (STAPLE) ×4
STAPLER ECHELON POWER CIR 31 (STAPLE) ×1 IMPLANT
STAPLER RELOAD 4.3X60 GREEN (STAPLE)
STAPLER RELOAD 4.3X60 GRN DVNC (STAPLE)
STOPCOCK 4 WAY LG BORE MALE ST (IV SETS) ×8 IMPLANT
SURGILUBE 2OZ TUBE FLIPTOP (MISCELLANEOUS) IMPLANT
SUT MNCRL AB 4-0 PS2 18 (SUTURE) ×4 IMPLANT
SUT PDS AB 1 CT1 27 (SUTURE) ×8 IMPLANT
SUT PROLENE 0 CT 2 (SUTURE) IMPLANT
SUT PROLENE 2 0 KS (SUTURE) ×1 IMPLANT
SUT PROLENE 2 0 SH DA (SUTURE) ×1 IMPLANT
SUT SILK 2 0 (SUTURE)
SUT SILK 2 0 SH CR/8 (SUTURE) IMPLANT
SUT SILK 2-0 18XBRD TIE 12 (SUTURE) IMPLANT
SUT SILK 3 0 (SUTURE)
SUT SILK 3 0 SH CR/8 (SUTURE) ×4 IMPLANT
SUT SILK 3-0 18XBRD TIE 12 (SUTURE) IMPLANT
SUT V-LOC BARB 180 2/0GR6 GS22 (SUTURE)
SUT VIC AB 3-0 SH 18 (SUTURE) IMPLANT
SUT VIC AB 3-0 SH 27 (SUTURE)
SUT VIC AB 3-0 SH 27XBRD (SUTURE) IMPLANT
SUT VICRYL 0 UR6 27IN ABS (SUTURE) ×4 IMPLANT
SUTURE V-LC BRB 180 2/0GR6GS22 (SUTURE) IMPLANT
SYR 10ML ECCENTRIC (SYRINGE) ×4 IMPLANT
SYS LAPSCP GELPORT 120MM (MISCELLANEOUS)
SYSTEM LAPSCP GELPORT 120MM (MISCELLANEOUS) IMPLANT
TOWEL OR NON WOVEN STRL DISP B (DISPOSABLE) ×4 IMPLANT
TRAY FOLEY MTR SLVR 16FR STAT (SET/KITS/TRAYS/PACK) ×4 IMPLANT
TROCAR ADV FIXATION 5X100MM (TROCAR) ×5 IMPLANT
TUBING CONNECTING 10 (TUBING) ×12 IMPLANT
TUBING INSUFFLATION 10FT LAP (TUBING) ×4 IMPLANT
TUBING UROLOGY SET (TUBING) IMPLANT

## 2020-06-16 NOTE — Anesthesia Procedure Notes (Signed)
Procedure Name: Intubation Date/Time: 06/16/2020 11:58 AM Performed by: Montel Clock, CRNA Pre-anesthesia Checklist: Patient identified, Emergency Drugs available, Suction available, Patient being monitored and Timeout performed Patient Re-evaluated:Patient Re-evaluated prior to induction Oxygen Delivery Method: Circle system utilized Preoxygenation: Pre-oxygenation with 100% oxygen Induction Type: IV induction Ventilation: Mask ventilation without difficulty Laryngoscope Size: Mac and 3 Grade View: Grade I Tube type: Oral Tube size: 7.0 mm Number of attempts: 1 Airway Equipment and Method: Stylet Placement Confirmation: ETT inserted through vocal cords under direct vision,  positive ETCO2 and breath sounds checked- equal and bilateral Secured at: 21 cm Tube secured with: Tape Dental Injury: Teeth and Oropharynx as per pre-operative assessment

## 2020-06-16 NOTE — Transfer of Care (Signed)
Immediate Anesthesia Transfer of Care Note  Patient: [name]  Procedure(s) Performed: ROBOTIC ASSISTED RESECTION OF LEFT COLON FOR DIVERTICULITIS,  SPLENIC FLEXURE MOBILIZATION BILATERAL TAP BLOCK (N/A Abdomen) RIGID PROCTOSCOPY (N/A Rectum) CYSTOSCOPY WITH FIREFLY INJECTION (Bilateral Ureter)  Patient Location: PACU  Anesthesia Type:General  Level of Consciousness: awake, alert , oriented and patient cooperative  Airway & Oxygen Therapy: Patient Spontanous Breathing and Patient connected to face mask oxygen  Post-op Assessment: Report given to RN, Post -op Vital signs reviewed and stable and Patient moving all extremities X 4  Post vital signs: stable  Last Vitals:  Vitals Value Taken Time  BP 137/83 06/16/20 1535  Temp 36.4 C 06/16/20 1535  Pulse 93 06/16/20 1544  Resp 8 06/16/20 1544  SpO2 100 % 06/16/20 1544  Vitals shown include unvalidated device data.  Last Pain:  Vitals:   06/16/20 1535  TempSrc:   PainSc: 0-No pain         Complications: No complications documented.

## 2020-06-16 NOTE — Anesthesia Postprocedure Evaluation (Signed)
Anesthesia Post Note  Patient: [name]  Procedure(s) Performed: ROBOTIC ASSISTED RESECTION OF LEFT COLON FOR DIVERTICULITIS,  SPLENIC FLEXURE MOBILIZATION BILATERAL TAP BLOCK (N/A Abdomen) RIGID PROCTOSCOPY (N/A Rectum) CYSTOSCOPY WITH FIREFLY INJECTION (Bilateral Ureter)     Patient location during evaluation: PACU Anesthesia Type: General Level of consciousness: awake and alert Pain management: pain level controlled Vital Signs Assessment: post-procedure vital signs reviewed and stable Respiratory status: spontaneous breathing, nonlabored ventilation, respiratory function stable and patient connected to nasal cannula oxygen Cardiovascular status: blood pressure returned to baseline and stable Postop Assessment: no apparent nausea or vomiting Anesthetic complications: no   No complications documented.  Last Vitals:  Vitals:   06/16/20 1628 06/16/20 1807  BP: 140/80 (!) 93/57  Pulse: 92 76  Resp: 18 12  Temp: 36.5 C 36.6 C  SpO2: 100% 97%    Last Pain:  Vitals:   06/16/20 1807  TempSrc: Oral  PainSc:                  Zalea Pete P Yasheka Fossett

## 2020-06-16 NOTE — H&P (Signed)
H&P Physician requesting consult: Remo Lipps gross  Chief Complaint: Diverticulitis  History of Present Illness: 54 year old female with a history of perforated diverticulitis with abscess formation presents for colectomy.  Bilateral firefly injections into the ureter are requested.  Past Medical History:  Diagnosis Date  . ADHD (attention deficit hyperactivity disorder)   . Alcohol dependence with acute alcoholic intoxication with complication ([place]) 07/05/5052  . Anemia 1990s  . Anxiety   . Arthritis    neck  . Asthma    uses inhalers every day  . Cancer ([place])    bladder  . Cutaneous lupus erythematosus   . 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 History of anemia   . Severe episode of recurrent major depressive disorder, without psychotic features ([place]) 01/07/2020  . Sleep apnea    mild  . SUI (stress urinary incontinence, female)    Past Surgical History:  Procedure Laterality Date  . BLADDER SUSPENSION N/A 02/13/2013   Procedure: [name] SLING;  Surgeon: Malka So, MD;  Location: [facility];  Service: Urology;  Laterality: N/A;  . CESAREAN SECTION  12-23-2004    W/ LEFT TUBAL LIGATION AND RIGHT SALPINGECTOMY  . CESAREAN SECTION  1995  . IR RADIOLOGIST EVAL & MGMT  04/21/2020  . IR RADIOLOGIST EVAL & MGMT  05/12/2020  . ORIF HUMERUS FRACTURE Right 04/28/2014   Procedure: OPEN REDUCTION INTERNAL FIXATION (ORIF) PROXIMAL HUMERUS FRACTURE;  Surgeon: Nita Sells, MD;  Location: [facility];  Service: Orthopedics;  Laterality: Right;  . ORIF PROXIMAL HUMERUS FRACTURE Right 04/28/2014   DR CHANDLER  . TUBAL LIGATION      Home Medications:  Medications Prior to Admission  Medication Sig Dispense Refill Last Dose  . acetaminophen (TYLENOL) 325 MG tablet Take 2 tablets (650 mg total) by mouth every 6 (six) hours as needed for mild pain (or temp > 100).   Past Month at Unknown time  . Acetylcysteine (N-ACETYL-L-CYSTEINE) 600 MG CAPS Take 600 mg by  mouth in the morning, at noon, and at bedtime.   Past Month at Unknown time  . BREZTRI AEROSPHERE 160-9-4.8 MCG/ACT AERO Inhale 1 puff into the lungs in the morning and at bedtime.   06/15/2020 at 1000  . cloNIDine (CATAPRES) 0.1 MG tablet Take 0.1 mg by mouth 4 (four) times daily.   06/15/2020 at 1800  . FLUoxetine HCl 60 MG TABS Take 60 mg by mouth daily.   06/16/2020 at 0900  . fluticasone (FLONASE) 50 MCG/ACT nasal spray Place 2 sprays into the nose daily.    Past Month at Unknown time  . gabapentin (NEURONTIN) 300 MG capsule Take 300-900 mg by mouth See admin instructions. Take 1 capsule (300mg ) by mouth three times daily and 3 capsules (900mg ) by mouth at bedtime.   06/15/2020 at 1800  . hydrOXYzine (VISTARIL) 25 MG capsule Take 25 mg by mouth 2 (two) times daily as needed for anxiety.   Past Week at Unknown time  . ipratropium-albuterol (DUONEB) 0.5-2.5 (3) MG/3ML SOLN Take 3 mLs by nebulization every 6 (six) hours as needed. (Patient taking differently: Take 3 mLs by nebulization every 6 (six) hours as needed (shortness of breath/wheezing). ) 360 mL 0 06/16/2020 at 0900  . ondansetron (ZOFRAN-ODT) 4 MG disintegrating tablet Take 1 tablet (4 mg total) by mouth every 6 (six) hours as needed for nausea. 20 tablet 0 Past Week at Unknown time  . oxyCODONE 10 MG TABS Take 0.5-1 tablets (5-10 mg total) by mouth every 6 (  six) hours as needed for moderate pain. 30 tablet 0 Past Week at Unknown time  . PROAIR HFA 108 (90 Base) MCG/ACT inhaler Inhale 2 puffs into the lungs every 4 (four) hours as needed for wheezing or shortness of breath.    06/16/2020 at 0900  . progesterone (PROMETRIUM) 100 MG capsule Take 200 mg by mouth daily.    06/16/2020 at 0900  . SUMAtriptan (IMITREX) 100 MG tablet Take 100 mg by mouth daily as needed for migraine.   Past Month at Unknown time  . topiramate (TOPAMAX) 50 MG tablet Take 50 mg by mouth daily.   06/16/2020 at 0900  . traZODone (DESYREL) 100 MG tablet Take 100 mg by mouth at  bedtime as needed for sleep.    Past Month at Unknown time  . Turmeric 500 MG CAPS Take 500 mg by mouth daily.   Past Month at Unknown time  . Vitamin D, Ergocalciferol, (DRISDOL) 1.25 MG (50000 UNIT) CAPS capsule Take 50,000 Units by mouth once a week.   Past Week at Unknown time  . ADZENYS XR-ODT 12.5 MG TBED Take 25 mg by mouth daily.    More than a month at Unknown time  . budesonide (PULMICORT) 0.5 MG/2ML nebulizer solution Take 2 mLs by nebulization daily as needed (shortness of breath).    More than a month at Unknown time  . Multiple Vitamin (MULTIVITAMIN WITH MINERALS) TABS tablet Take 1 tablet by mouth daily.   0900   Allergies:  Allergies  Allergen Reactions  . Codeine Nausea And Vomiting and Other (See Comments)    dizziness  . Hydrocodone     dizzy    Family History  Adopted: Yes   Social History:  reports that she quit smoking about 8 months ago. Her smoking use included cigarettes. She has a 5.00 pack-year smoking history. She has never used smokeless tobacco. She reports previous alcohol use. She reports that she does not use drugs.  ROS: A complete review of systems was performed.  All systems are negative except for pertinent findings as noted. ROS   Physical Exam:  Vital signs in last 24 hours: Temp:  [98.8 F (37.1 C)] 98.8 F (37.1 C) (07/14 1019) Pulse Rate:  [72] 72 (07/14 1019) Resp:  [16] 16 (07/14 1019) BP: (113)/(73) 113/73 (07/14 1019) SpO2:  [100 %] 100 % (07/14 1019) Weight:  [67.6 kg] 67.6 kg (07/14 1047) General:  Alert and oriented, No acute distress HEENT: Normocephalic, atraumatic Neck: No JVD or lymphadenopathy Cardiovascular: Regular rate and rhythm Lungs: Regular rate and effort Abdomen: Soft, nontender, nondistended, no abdominal masses Back: No CVA tenderness Extremities: No edema Neurologic: Grossly intact  Laboratory Data:  No results found for this or any previous visit (from the past 24 hour(s)). Recent Results (from the past  240 hour(s))  SARS CORONAVIRUS 2 (TAT 6-24 HRS) Nasopharyngeal Nasopharyngeal Swab     Status: None   Collection Time: 06/12/20  9:14 AM   Specimen: Nasopharyngeal Swab  Result Value Ref Range Status   SARS Coronavirus 2 NEGATIVE NEGATIVE Final    Comment: (NOTE) SARS-CoV-2 target nucleic acids are NOT DETECTED.  The SARS-CoV-2 RNA is generally detectable in upper and lower respiratory specimens during the acute phase of infection. Negative results do not preclude SARS-CoV-2 infection, do not rule out co-infections with other pathogens, and should not be used as the sole basis for treatment or other patient management decisions. Negative results must be combined with clinical observations, patient history, and epidemiological information.  The expected result is Negative.  Fact Sheet for Patients: SugarRoll.be  Fact Sheet for Healthcare Providers: https://www.woods-mathews.com/  This test is not yet approved or cleared by the [place] FDA and  has been authorized for detection and/or diagnosis of SARS-CoV-2 by FDA under an Emergency Use Authorization (EUA). This EUA will remain  in effect (meaning this test can be used) for the duration of the COVID-19 declaration under Se ction 564(b)(1) of the Act, 21 U.S.C. section 360bbb-3(b)(1), unless the authorization is terminated or revoked sooner.  Performed at [facility], [place] [address], [place], [place] [address]    Creatinine: No results for input(s): CREATININE in the last 168 hours.  Impression/Assessment:  Diverticulitis  Plan:  Proceed with cystoscopy with bilateral firefly injection.  Risk benefits discussed.  Marton Redwood, III 06/16/2020, 11:13 AM

## 2020-06-16 NOTE — Op Note (Signed)
Operative Note  Preoperative diagnosis:  1.  Perforated diverticulitis with abscess  Post operative diagnosis: 1.  Perforated diverticulitis with abscess  Procedure(s): 1.  Cystoscopy with bilateral retrograde ureteral instillation of firefly  Surgeon: Link Snuffer, MD  Assistants: None  Anesthesia: General  Complications: None immediate  EBL: Minimal  Specimens: 1.  None  Drains/Catheters: 1.   Foley catheter  Intraoperative findings: 1.  Normal urethra and bladder   Indication: 54 year old female with a history of perforated diverticulitis presents for colectomy.  Intraoperative ureteral firefly instillation was requested.  Description of procedure:  The patient was identified and consent was obtained.  The patient was taken to the operating room and placed in the supine position.  The patient was placed under general anesthesia.  Perioperative antibiotics were administered.  The patient was placed in dorsal lithotomy.  Patient was prepped and draped in a standard sterile fashion and a timeout was performed.  A 21 French rigid cystoscope was advanced into the urethra and into the bladder.  The left distal most portion of the ureter was cannulated with an open-ended ureteral catheter.  Retrograde instillation of 7.5 cc of firefly was performed.  The right distal most portion of the ureter was cannulated with an open-ended ureteral catheter.  Retrograde instillation of 7.5 cc of firefly was performed.  The scope was withdrawn and Foley catheter was placed.  This concluded my portion of the operation.  Patient tolerated procedure well and the case was handed over to general surgery.  Plan: Per general surgery.  Urology will be available as needed.

## 2020-06-16 NOTE — H&P (Signed)
[name] DOB: 1966-09-11 Married / Language: Cleophus Molt / Race: White Female    ` ` Patient sent for surgical consultation  Chief Complaint: Diverticulitis with chronic abscess and fistula ` ` The patient is a woman who had episode of diverticulitis and worsened. Required percutaneous drainage of abscess. Seemed to improve but then had worsening discomfort. Started back on oral antibiotic. Head drain study last week. Still with some diverticulitis. However no new abscess. Smaller abscess cavity with colon connection concerning for fistula. She felt worse discomfort and was concerned. Came emergency room. Was readmitted last week. Improved on IV antibiotics after a few days. She was discharged on oral medications, switching from Augmentin to Cipro/Flagyl. His tentative that she will get surgical resection 14th of July. She comes in today for follow up with her husband  She is feeling better. Appetite improving. Tolerate the Cipro and Flagyl. She has not been flushing the drain per radiology instructions. Very little is coming out now. Not needing stronger pain meds. No fevers or chills. Moving her bowels a few times a day.    (Review of systems as stated in this history (HPI) or in the review of systems. Otherwise all other 12 point ROS are negative) ` ` `  This patient encounter took 30 minutes today to perform the following: obtain history, perform exam, review outside records, interpret tests & imaging, counsel the patient on their diagnosis; and, document this encounter, including findings & plan in the electronic health record (EHR).   Past Surgical History Sabino Gasser, CMA; 05/05/2020 10:59 AM) Cesarean Section - Multiple  Oral Surgery  Shoulder Surgery  Right.  Diagnostic Studies History Sabino Gasser, CMA; 05/05/2020 10:59 AM) Colonoscopy  within last year Mammogram  within last year Pap Smear  >5 years ago  Allergies Sabino Gasser, CMA; 05/05/2020 10:59 AM) No Known Drug Allergies  [05/05/2020]: Allergies Reconciled   Medication History Sabino Gasser, CMA; 05/05/2020 11:01 AM) Gabapentin (300MG  Capsule, Oral) Active. Progesterone Micronized (100MG  Capsule, Oral) Active. metroNIDAZOLE (500MG  Tablet, Oral) Active. Vitamin D (Ergocalciferol) (1.25 MG(50000 UT) Capsule, Oral) Active. FLUoxetine HCl (60MG  Tablet, Oral) Active. Ciprofloxacin HCl (500MG  Tablet, Oral) Active. Amoxicillin-Pot Clavulanate (875-125MG  Tablet, 1 (one) Oral two times daily, Taken starting 04/26/2020) Active. Medications Reconciled  Social History Sabino Gasser, CMA; 05/05/2020 10:59 AM) Alcohol use  Remotely quit alcohol use. Caffeine use  Tea. No drug use  Tobacco use  Former smoker.  Family History Sabino Gasser, [place]; 05/05/2020 10:59 AM) Family history unknown  First Degree Relatives   Pregnancy / Birth History Sabino Gasser, [place]; 05/05/2020 10:59 AM) Age at menarche  69 years. Age of menopause  56-50 [place]  3 Maternal age  71-30 Para  2  Other Problems Sabino Gasser, [place]; 05/05/2020 10:59 AM) Alcohol Abuse  Anxiety Disorder  Bladder Problems  Diverticulosis  Migraine Headache     Review of Systems Sabino Gasser CMA; 05/05/2020 10:59 AM) General Not Present- Appetite Loss, Chills, Fatigue, Fever, Night Sweats, Weight Gain and Weight Loss. Skin Not Present- Change in Wart/Mole, Dryness, Hives, Jaundice, New Lesions, Non-Healing Wounds, Rash and Ulcer. HEENT Not Present- Earache, Hearing Loss, Hoarseness, Nose Bleed, Oral Ulcers, Ringing in the Ears, Seasonal Allergies, Sinus Pain, Sore Throat, Visual Disturbances, Wears glasses/contact lenses and Yellow Eyes. Respiratory Not Present- Bloody sputum, Chronic Cough, Difficulty Breathing, Snoring and Wheezing. Breast Not Present- Breast Mass, Breast Pain, Nipple Discharge and Skin Changes. Cardiovascular Not Present- Chest Pain, Difficulty  Breathing Lying Down, Leg Cramps, Palpitations, Rapid Heart Rate, Shortness  of Breath and Swelling of Extremities. Gastrointestinal Present- Abdominal Pain, Bloating, Nausea and Vomiting. Not Present- Bloody Stool, Change in Bowel Habits, Chronic diarrhea, Constipation, Difficulty Swallowing, Excessive gas, Gets full quickly at meals, Hemorrhoids, Indigestion and Rectal Pain. Female Genitourinary Not Present- Frequency, Nocturia, Painful Urination, Pelvic Pain and Urgency. Musculoskeletal Not Present- Back Pain, Joint Pain, Joint Stiffness, Muscle Pain, Muscle Weakness and Swelling of Extremities. Neurological Not Present- Decreased Memory, Fainting, Headaches, Numbness, Seizures, Tingling, Tremor, Trouble walking and Weakness. Psychiatric Not Present- Anxiety, Bipolar, Change in Sleep Pattern, Depression, Fearful and Frequent crying. Endocrine Not Present- Cold Intolerance, Excessive Hunger, Hair Changes, Heat Intolerance, Hot flashes and New Diabetes. Hematology Not Present- Blood Thinners, Easy Bruising, Excessive bleeding, Gland problems, HIV and Persistent Infections.  Vitals Sabino Gasser CMA; 05/05/2020 11:03 AM) 05/05/2020 11:01 AM Weight: 152 lb Height: 65in Body Surface Area: 1.76 m Body Mass Index: 25.29 kg/m  Temp.: 98.47F (Tympanic)  Pulse: 87 (Regular)  BP: 132/80(Sitting, Left Arm, Standard)   BP 113/73 (BP Location: Right Arm)   Pulse 72   Temp 98.8 F (37.1 C) (Oral)   Resp 16   Ht 5\' 5"  (1.651 m)   Wt 67.6 kg   SpO2 100%   BMI 24.80 kg/m      Physical Exam [name] MD; 05/05/2020 12:26 PM) General Mental Status-Alert. General Appearance-Not in acute distress, Not Sickly. Orientation-Oriented X3. Hydration-Well hydrated. Voice-Normal.  Integumentary Global Assessment Upon inspection and palpation of skin surfaces of the - Axillae: non-tender, no inflammation or ulceration, no drainage. and Distribution of scalp and body  hair is normal. General Characteristics Temperature - normal warmth is noted.  Head and Neck Head-normocephalic, atraumatic with no lesions or palpable masses. Face Global Assessment - atraumatic, no absence of expression. Neck Global Assessment - no abnormal movements, no bruit auscultated on the right, no bruit auscultated on the left, no decreased range of motion, non-tender. Trachea-midline. Thyroid Gland Characteristics - non-tender.  Eye Eyeball - Left-Extraocular movements intact, No Nystagmus - Left. Eyeball - Right-Extraocular movements intact, No Nystagmus - Right. Cornea - Left-No Hazy - Left. Cornea - Right-No Hazy - Right. Sclera/Conjunctiva - Left-No scleral icterus, No Discharge - Left. Sclera/Conjunctiva - Right-No scleral icterus, No Discharge - Right. Pupil - Left-Direct reaction to light normal. Pupil - Right-Direct reaction to light normal.  ENMT Ears Pinna - Left - no drainage observed, no generalized tenderness observed. Pinna - Right - no drainage observed, no generalized tenderness observed. Nose and Sinuses External Inspection of the Nose - no destructive lesion observed. Inspection of the nares - Left - quiet respiration. Inspection of the nares - Right - quiet respiration. Mouth and Throat Lips - Upper Lip - no fissures observed, no pallor noted. Lower Lip - no fissures observed, no pallor noted. Nasopharynx - no discharge present. Oral Cavity/Oropharynx - Tongue - no dryness observed. Oral Mucosa - no cyanosis observed. Hypopharynx - no evidence of airway distress observed.  Chest and Lung Exam Inspection Movements - Normal and Symmetrical. Accessory muscles - No use of accessory muscles in breathing. Palpation Palpation of the chest reveals - Non-tender. Auscultation Breath sounds - Normal and Clear.  Cardiovascular Auscultation Rhythm - Regular. Murmurs & Other Heart Sounds - Auscultation of the heart reveals - No  Murmurs and No Systolic Clicks.  Abdomen Inspection Inspection of the abdomen reveals - No Visible peristalsis and No Abnormal pulsations. Umbilicus - No Bleeding, No Urine drainage. Palpation/Percussion Palpation and Percussion of the abdomen reveal - Soft, Non Tender,  No Rebound tenderness, No Rigidity (guarding) and No Cutaneous hyperesthesia. Note: Abdomen overweight but soft. Not severely distended. No diastasis recti. No umbilical or other anterior abdominal wall hernias  Drains side at left lower quadrant. Clean dressing. Minimal tan drainage in biliary bag   Female Genitourinary Sexual Maturity Tanner 5 - Adult hair pattern. Note: No vaginal bleeding nor discharge   Peripheral Vascular Upper Extremity Inspection - Left - No Cyanotic nailbeds - Left, Not Ischemic. Inspection - Right - No Cyanotic nailbeds - Right, Not Ischemic.  Neurologic Neurologic evaluation reveals -normal attention span and ability to concentrate, able to name objects and repeat phrases. Appropriate fund of knowledge , normal sensation and normal coordination. Mental Status Affect - not angry, not paranoid. Cranial Nerves-Normal Bilaterally. Gait-Normal.  Neuropsychiatric Mental status exam performed with findings of-able to articulate well with normal speech/language, rate, volume and coherence, thought content normal with ability to perform basic computations and apply abstract reasoning and no evidence of hallucinations, delusions, obsessions or homicidal/suicidal ideation.  Musculoskeletal Global Assessment Spine, Ribs and Pelvis - no instability, subluxation or laxity. Right Upper Extremity - no instability, subluxation or laxity.  Lymphatic Head & Neck  General Head & Neck Lymphatics: Bilateral - Description - No Localized lymphadenopathy. Axillary  General Axillary Region: Bilateral - Description - No Localized lymphadenopathy. Femoral & Inguinal  Generalized  Femoral & Inguinal Lymphatics: Left - Description - No Localized lymphadenopathy. Right - Description - No Localized lymphadenopathy.    Assessment & Plan [name] MD; 05/05/2020 12:28 PM) DIVERTICULITIS OF INTESTINE WITH ABSCESS (K57.80) Impression: Recovering status post hospitalizations for diverticulitis with abscess requiring drainage and readmission with discomfort.  She seems to be doing much better overall.  Repeat follow-up drain study next week. Because the output is minimal and she is feeling better, hopefully the abscess & colon fistula has finally closed. Hopefully can remove the drain. If still persistent fistula, keep drain until surgery.  Continue oral Cipro/Flagyl antibiotics at least until drain study. If no persistent fistula/abscess and better, see if she can stop all antibiotics. Otherwise if has recurring symptoms or persistent symptoms, continue ABX until surgery.  Robotic distal colectomy next month to allow time for infection/inflammation be minimal, therefore enhancing the chance of minimally invasive approach with immediate anastomosis & avoiding need for ostomy  Ideally would give firefly cystoscopy and ureteral injections given the inflammation and chronic abscess. Patient is feeling better. Technique in discussion made again with the patient and her husband.  She is hoping to get back to work at least part-time. That is reasonable. If she feels worse, stay out until she gets surgery. Current Plans Written instructions provided You are being scheduled for surgery- Our schedulers will call you.  You should hear from our office's scheduling department within 5 working days about the location, date, and time of surgery. We try to make accommodations for patient's preferences in scheduling surgery, but sometimes the OR schedule or the surgeon's schedule prevents [place] from making those accommodations.  If you have not heard from our office  [phone]) in 5 working days, call the office and ask for your surgeon's nurse.  If you have other questions about your diagnosis, plan, or surgery, call the office and ask for your surgeon's nurse.  The patient was instructed to call back in 2 weeks with progress Pt Education - Pamphlet Given - Laparoscopic Colorectal Surgery: discussed with patient and provided information. You are being scheduled for surgery- Our schedulers will call you.  You  should hear from our office's scheduling department within 5 working days about the location, date, and time of surgery. We try to make accommodations for patient's preferences in scheduling surgery, but sometimes the OR schedule or the surgeon's schedule prevents [place] from making those accommodations.  If you have not heard from our office [phone]) in 5 working days, call the office and ask for your surgeon's nurse.  If you have other questions about your diagnosis, plan, or surgery, call the office and ask for your surgeon's nurse.  The anatomy & physiology of the digestive tract was discussed. The pathophysiology of the colon was discussed. Natural history risks without surgery was discussed. I feel the risks of no intervention will lead to serious problems that outweigh the operative risks; therefore, I recommended a partial colectomy to remove the pathology. Minimally invasive (Robotic/Laparoscopic) & open techniques were discussed.  Risks such as bleeding, infection, abscess, leak, reoperation, possible ostomy, hernia, heart attack, death, and other risks were discussed. I noted a good likelihood this will help address the problem. Goals of post-operative recovery were discussed as well. Need for adequate nutrition, daily bowel regimen and healthy physical activity, to optimize recovery was noted as well. We will work to minimize complications. Educational materials were available as well. Questions were answered. The  patient expresses understanding & wishes to proceed with surgery.  Pt Education - [facility] Colectomy post-op instructions: discussed with patient and provided information. Pt Education - [facility] Diverticular Disease (AT) ANXIETY (F41.9)    Signed electronically by [name], MD (05/05/2020 12:28 PM)  [name], MD, FACS, MASCRS Gastrointestinal and Minimally Invasive Surgery  [facility] [address] [place], [place] [address] [phone] Fax [phone] Main/Paging  CONTACT INFORMATION: Weekday (9AM-5PM) concerns: Call [facility] main office at [phone] Weeknight (5PM-9AM) or Weekend/Holiday concerns: Check [email or web address] for General Surgery [facility] coverage (Please, do not use SecureChat as it is not reliable communication to operating surgeons for immediate patient care)

## 2020-06-16 NOTE — Discharge Instructions (Signed)
SURGERY: POST OP INSTRUCTIONS °(Surgery for small bowel obstruction, colon resection, etc) ° ° °###################################################################### ° °EAT °Gradually transition to a high fiber diet with a fiber supplement over the next few days after discharge ° °WALK °Walk an hour a day.  Control your pain to do that.   ° °CONTROL PAIN °Control pain so that you can walk, sleep, tolerate sneezing/coughing, go up/down stairs. ° °HAVE A BOWEL MOVEMENT DAILY °Keep your bowels regular to avoid problems.  OK to try a laxative to override constipation.  OK to use an antidairrheal to slow down diarrhea.  Call if not better after 2 tries ° °CALL IF YOU HAVE PROBLEMS/CONCERNS °Call if you are still struggling despite following these instructions. °Call if you have concerns not answered by these instructions ° °###################################################################### ° ° °DIET °Follow a light diet the first few days at home.  Start with a bland diet such as soups, liquids, starchy foods, low fat foods, etc.  If you feel full, bloated, or constipated, stay on a ful liquid or pureed/blenderized diet for a few days until you feel better and no longer constipated. °Be sure to drink plenty of fluids every day to avoid getting dehydrated (feeling dizzy, not urinating, etc.). °Gradually add a fiber supplement to your diet over the next week.  Gradually get back to a regular solid diet.  Avoid fast food or heavy meals the first week as you are more likely to get nauseated. °It is expected for your digestive tract to need a few months to get back to normal.  It is common for your bowel movements and stools to be irregular.  You will have occasional bloating and cramping that should eventually fade away.  Until you are eating solid food normally, off all pain medications, and back to regular activities; your bowels will not be normal. °Focus on eating a low-fat, high fiber diet the rest of your life  (See Getting to Good Bowel Health, below). ° °CARE of your INCISION or WOUND °It is good for closed incision and even open wounds to be washed every day.  Shower every day.  Short baths are fine.  Wash the incisions and wounds clean with soap & water.    °If you have a closed incision(s), wash the incision with soap & water every day.  You may leave closed incisions open to air if it is dry.   You may cover the incision with clean gauze & replace it after your daily shower for comfort. °If you have skin tapes (Steristrips) or skin glue (Dermabond) on your incision, leave them in place.  They will fall off on their own like a scab.  You may trim any edges that curl up with clean scissors.  If you have staples, set up an appointment for them to be removed in the office in 10 days after surgery.  °If you have a drain, wash around the skin exit site with soap & water and place a new dressing of gauze or band aid around the skin every day.  Keep the drain site clean & dry.    °If you have an open wound with packing, see wound care instructions.  In general, it is encouraged that you remove your dressing and packing, shower with soap & water, and replace your dressing once a day.  Pack the wound with clean gauze moistened with normal (0.9%) saline to keep the wound moist & uninfected.  Pressure on the dressing for 30 minutes will stop most wound   bleeding.  Eventually your body will heal & pull the open wound closed over the next few months.  °Raw open wounds will occasionally bleed or secrete yellow drainage until it heals closed.  Drain sites will drain a little until the drain is removed.  Even closed incisions can have mild bleeding or drainage the first few days until the skin edges scab over & seal.   °If you have an open wound with a wound vac, see wound vac care instructions. ° ° ° ° °ACTIVITIES as tolerated °Start light daily activities --- self-care, walking, climbing stairs-- beginning the day after surgery.   Gradually increase activities as tolerated.  Control your pain to be active.  Stop when you are tired.  Ideally, walk several times a day, eventually an hour a day.   °Most people are back to most day-to-day activities in a few weeks.  It takes 4-8 weeks to get back to unrestricted, intense activity. °If you can walk 30 minutes without difficulty, it is safe to try more intense activity such as jogging, treadmill, bicycling, low-impact aerobics, swimming, etc. °Save the most intensive and strenuous activity for last (Usually 4-8 weeks after surgery) such as sit-ups, heavy lifting, contact sports, etc.  Refrain from any intense heavy lifting or straining until you are off narcotics for pain control.  You will have off days, but things should improve week-by-week. °DO NOT PUSH THROUGH PAIN.  Let pain be your guide: If it hurts to do something, don't do it.  Pain is your body warning you to avoid that activity for another week until the pain goes down. °You may drive when you are no longer taking narcotic prescription pain medication, you can comfortably wear a seatbelt, and you can safely make sudden turns/stops to protect yourself without hesitating due to pain. °You may have sexual intercourse when it is comfortable. If it hurts to do something, stop. ° °MEDICATIONS °Take your usually prescribed home medications unless otherwise directed.   °Blood thinners:  °Usually you can restart any strong blood thinners after the second postoperative day.  It is OK to take aspirin right away.    ° If you are on strong blood thinners (warfarin/Coumadin, Plavix, Xerelto, Eliquis, Pradaxa, etc), discuss with your surgeon, medicine PCP, and/or cardiologist for instructions on when to restart the blood thinner & if blood monitoring is needed (PT/INR blood check, etc).   ° ° °PAIN CONTROL °Pain after surgery or related to activity is often due to strain/injury to muscle, tendon, nerves and/or incisions.  This pain is usually  short-term and will improve in a few months.  °To help speed the process of healing and to get back to regular activity more quickly, DO THE FOLLOWING THINGS TOGETHER: °1. Increase activity gradually.  DO NOT PUSH THROUGH PAIN °2. Use Ice and/or Heat °3. Try Gentle Massage and/or Stretching °4. Take over the counter pain medication °5. Take Narcotic prescription pain medication for more severe pain ° °Good pain control = faster recovery.  It is better to take more medicine to be more active than to stay in bed all day to avoid medications. °1.  Increase activity gradually °Avoid heavy lifting at first, then increase to lifting as tolerated over the next 6 weeks. °Do not “push through” the pain.  Listen to your body and avoid positions and maneuvers than reproduce the pain.  Wait a few days before trying something more intense °Walking an hour a day is encouraged to help your body recover faster   and more safely.  Start slowly and stop when getting sore.  If you can walk 30 minutes without stopping or pain, you can try more intense activity (running, jogging, aerobics, cycling, swimming, treadmill, sex, sports, weightlifting, etc.) °Remember: If it hurts to do it, then don’t do it! °2. Use Ice and/or Heat °You will have swelling and bruising around the incisions.  This will take several weeks to resolve. °Ice packs or heating pads (6-8 times a day, 30-60 minutes at a time) will help sooth soreness & bruising. °Some people prefer to use ice alone, heat alone, or alternate between ice & heat.  Experiment and see what works best for you.  Consider trying ice for the first few days to help decrease swelling and bruising; then, switch to heat to help relax sore spots and speed recovery. °Shower every day.  Short baths are fine.  It feels good!  Keep the incisions and wounds clean with soap & water.   °3. Try Gentle Massage and/or Stretching °Massage at the area of pain many times a day °Stop if you feel pain - do not  overdo it °4. Take over the counter pain medication °This helps the muscle and nerve tissues become less irritable and calm down faster °Choose ONE of the following over-the-counter anti-inflammatory medications: °Acetaminophen 500mg tabs (Tylenol) 1-2 pills with every meal and just before bedtime (avoid if you have liver problems or if you have acetaminophen in you narcotic prescription) °Naproxen 220mg tabs (ex. Aleve, Naprosyn) 1-2 pills twice a day (avoid if you have kidney, stomach, IBD, or bleeding problems) °Ibuprofen 200mg tabs (ex. Advil, Motrin) 3-4 pills with every meal and just before bedtime (avoid if you have kidney, stomach, IBD, or bleeding problems) °Take with food/snack several times a day as directed for at least 2 weeks to help keep pain / soreness down & more manageable. °5. Take Narcotic prescription pain medication for more severe pain °A prescription for strong pain control is often given to you upon discharge (for example: oxycodone/Percocet, hydrocodone/Norco/Vicodin, or tramadol/Ultram) °Take your pain medication as prescribed. °Be mindful that most narcotic prescriptions contain Tylenol (acetaminophen) as well - avoid taking too much Tylenol. °If you are having problems/concerns with the prescription medicine (does not control pain, nausea, vomiting, rash, itching, etc.), please call us [phone] to see if we need to switch you to a different pain medicine that will work better for you and/or control your side effects better. °If you need a refill on your pain medication, you must call the office before 4 pm and on weekdays only.  By federal law, prescriptions for narcotics cannot be called into a pharmacy.  They must be filled out on paper & picked up from our office by the patient or authorized caretaker.  Prescriptions cannot be filled after 4 pm nor on weekends.   ° °WHEN TO CALL US [phone] °Severe uncontrolled or worsening pain  °Fever over 101 F (38.5 C) °Concerns with  the incision: Worsening pain, redness, rash/hives, swelling, bleeding, or drainage °Reactions / problems with new medications (itching, rash, hives, nausea, etc.) °Nausea and/or vomiting °Difficulty urinating °Difficulty breathing °Worsening fatigue, dizziness, lightheadedness, blurred vision °Other concerns °If you are not getting better after two weeks or are noticing you are getting worse, contact our office [phone] for further advice.  We may need to adjust your medications, re-evaluate you in the office, send you to the emergency room, or see what other things we can do to help. °The   clinic staff is available to answer your questions during regular business hours (8:30am-5pm).  Please don’t hesitate to call and ask to speak to one of our nurses for clinical concerns.    °A surgeon from [facility] is always on call at the hospitals 24 hours/day °If you have a medical emergency, go to the nearest emergency room or call 911. ° °FOLLOW UP in our office °One the day of your discharge from the hospital (or the next business weekday), please call [facility] to set up or confirm an appointment to see your surgeon in the office for a follow-up appointment.  Usually it is 2-3 weeks after your surgery.   °If you have skin staples at your incision(s), let the office know so we can set up a time in the office for the nurse to remove them (usually around 10 days after surgery). °Make sure that you call for appointments the day of discharge (or the next business weekday) from the hospital to ensure a convenient appointment time. °IF YOU HAVE DISABILITY OR FAMILY LEAVE FORMS, BRING THEM TO THE OFFICE FOR PROCESSING.  DO NOT GIVE THEM TO YOUR DOCTOR. ° °[facility] °[address], [address], [place],   [address] ? °[phone] - Main °[phone] - Toll Free,  [phone] - Fax °[email or web address] ° °GETTING TO GOOD BOWEL HEALTH. °It is  expected for your digestive tract to need a few months to get back to normal.  It is common for your bowel movements and stools to be irregular.  You will have occasional bloating and cramping that should eventually fade away.  Until you are eating solid food normally, off all pain medications, and back to regular activities; your bowels will not be normal.   °Avoiding constipation °The goal: ONE SOFT BOWEL MOVEMENT A DAY!    °Drink plenty of fluids.  Choose water first. °TAKE A FIBER SUPPLEMENT EVERY DAY THE REST OF YOUR LIFE °During your first week back home, gradually add back a fiber supplement every day °Experiment which form you can tolerate.   There are many forms such as powders, tablets, wafers, gummies, etc °Psyllium bran (Metamucil), methylcellulose (Citrucel), Miralax or Glycolax, Benefiber, Flax Seed.  °Adjust the dose week-by-week (1/2 dose/day to 6 doses a day) until you are moving your bowels 1-2 times a day.  Cut back the dose or try a different fiber product if it is giving you problems such as diarrhea or bloating. °Sometimes a laxative is needed to help jump-start bowels if constipated until the fiber supplement can help regulate your bowels.  If you are tolerating eating & you are farting, it is okay to try a gentle laxative such as double dose MiraLax, prune juice, or Milk of Magnesia.  Avoid using laxatives too often. °Stool softeners can sometimes help counteract the constipating effects of narcotic pain medicines.  It can also cause diarrhea, so avoid using for too long. °If you are still constipated despite taking fiber daily, eating solids, and a few doses of laxatives, call our office. °Controlling diarrhea °Try drinking liquids and eating bland foods for a few days to avoid stressing your intestines further. °Avoid dairy products (especially milk & ice cream) for a short time.  The intestines often can lose the ability to digest lactose when stressed. °Avoid foods that cause gassiness or  bloating.  Typical foods include beans and other legumes, cabbage, broccoli, and dairy foods.  Avoid greasy, spicy, fast foods.  Every person has   some sensitivity to other foods, so listen to your body and avoid those foods that trigger problems for you. °Probiotics (such as active yogurt, Align, etc) may help repopulate the intestines and colon with normal bacteria and calm down a sensitive digestive tract °Adding a fiber supplement gradually can help thicken stools by absorbing excess fluid and retrain the intestines to act more normally.  Slowly increase the dose over a few weeks.  Too much fiber too soon can backfire and cause cramping & bloating. °It is okay to try and slow down diarrhea with a few doses of antidiarrheal medicines.   °Bismuth subsalicylate (ex. Kayopectate, Pepto Bismol) for a few doses can help control diarrhea.  Avoid if pregnant.   °Loperamide (Imodium) can slow down diarrhea.  Start with one tablet (2mg) first.  Avoid if you are having fevers or severe pain.  °ILEOSTOMY PATIENTS WILL HAVE CHRONIC DIARRHEA since their colon is not in use.    °Drink plenty of liquids.  You will need to drink even more glasses of water/liquid a day to avoid getting dehydrated. °Record output from your ileostomy.  Expect to empty the bag every 3-4 hours at first.  Most people with a permanent ileostomy empty their bag 4-6 times at the least.   °Use antidiarrheal medicine (especially Imodium) several times a day to avoid getting dehydrated.  Start with a dose at bedtime & breakfast.  Adjust up or down as needed.  Increase antidiarrheal medications as directed to avoid emptying the bag more than 8 times a day (every 3 hours). °Work with your wound ostomy nurse to learn care for your ostomy.  See ostomy care instructions. °TROUBLESHOOTING IRREGULAR BOWELS °1) Start with a soft & bland diet. No spicy, greasy, or fried foods.  °2) Avoid gluten/wheat or dairy products from diet to see if symptoms improve. °3) Miralax  17gm or flax seed mixed in 8oz. water or juice-daily. May use 2-4 times a day as needed. °4) Gas-X, Phazyme, etc. as needed for gas & bloating.  °5) Prilosec (omeprazole) over-the-counter as needed °6)  Consider probiotics (Align, Activa, etc) to help calm the bowels down ° °Call your doctor if you are getting worse or not getting better.  Sometimes further testing (cultures, endoscopy, X-ray studies, CT scans, bloodwork, etc.) may be needed to help diagnose and treat the cause of the diarrhea. °[facility] °[address], [address], [place], [place]  [address] °[phone] - Main.    °[phone]  - Toll Free.   [phone] - Fax °[email or web address] ° ° ° °Diverticulitis ° °Diverticulitis is infection or inflammation of small pouches (diverticula) in the colon that form due to a condition called diverticulosis. Diverticula can trap stool (feces) and bacteria, causing infection and inflammation. °Diverticulitis may cause severe stomach pain and diarrhea. It may lead to tissue damage in the colon that causes bleeding. The diverticula may also burst (rupture) and cause infected stool to enter other areas of the abdomen. °Complications of diverticulitis can include: °· Bleeding. °· Severe infection. °· Severe pain. °· Rupture (perforation) of the colon. °· Blockage (obstruction) of the colon. °What are the causes? °This condition is caused by stool becoming trapped in the diverticula, which allows bacteria to grow in the diverticula. This leads to inflammation and infection. °What increases the risk? °You are more likely to develop this condition if: °· You have diverticulosis. The risk for diverticulosis increases if: °? You are overweight or obese. °? You use tobacco products. °? You   do not get enough exercise. °· You eat a diet that does not include enough fiber. High-fiber foods include fruits, vegetables, beans, nuts, and whole grains. °What are the signs or  symptoms? °Symptoms of this condition may include: °· Pain and tenderness in the abdomen. The pain is normally located on the left side of the abdomen, but it may occur in other areas. °· Fever and chills. °· Bloating. °· Cramping. °· Nausea. °· Vomiting. °· Changes in bowel routines. °· Blood in your stool. °How is this diagnosed? °This condition is diagnosed based on: °· Your medical history. °· A physical exam. °· Tests to make sure there is nothing else causing your condition. These tests may include: °? Blood tests. °? Urine tests. °? Imaging tests of the abdomen, including X-rays, ultrasounds, MRIs, or CT scans. °How is this treated? °Most cases of this condition are mild and can be treated at home. Treatment may include: °· Taking over-the-counter pain medicines. °· Following a clear liquid diet. °· Taking antibiotic medicines by mouth. °· Rest. °More severe cases may need to be treated at a hospital. Treatment may include: °· Not eating or drinking. °· Taking prescription pain medicine. °· Receiving antibiotic medicines through an IV tube. °· Receiving fluids and nutrition through an IV tube. °· Surgery. °When your condition is under control, your health care provider may recommend that you have a colonoscopy. This is an exam to look at the entire large intestine. During the exam, a lubricated, bendable tube is inserted into the anus and then passed into the rectum, colon, and other parts of the large intestine. A colonoscopy can show how severe your diverticula are and whether something else may be causing your symptoms. °Follow these instructions at home: °Medicines °· Take over-the-counter and prescription medicines only as told by your health care provider. These include fiber supplements, probiotics, and stool softeners. °· If you were prescribed an antibiotic medicine, take it as told by your health care provider. Do not stop taking the antibiotic even if you start to feel better. °· Do not drive or  use heavy machinery while taking prescription pain medicine. °General instructions ° °· Follow a full liquid diet or another diet as directed by your health care provider. After your symptoms improve, your health care provider may tell you to change your diet. He or she may recommend that you eat a diet that contains at least 25 g (25 grams) of fiber daily. Fiber makes it easier to pass stool. Healthy sources of fiber include: °? Berries. One cup contains 4-8 grams of fiber. °? Beans or lentils. One half cup contains 5-8 grams of fiber. °? Green vegetables. One cup contains 4 grams of fiber. °· Exercise for at least 30 minutes, 3 times each week. You should exercise hard enough to raise your heart rate and break a sweat. °· Keep all follow-up visits as told by your health care provider. This is important. You may need a colonoscopy. °Contact a health care provider if: °· Your pain does not improve. °· You have a hard time drinking or eating food. °· Your bowel movements do not return to normal. °Get help right away if: °· Your pain gets worse. °· Your symptoms do not get better with treatment. °· Your symptoms suddenly get worse. °· You have a fever. °· You vomit more than one time. °· You have stools that are bloody, black, or tarry. °Summary °· Diverticulitis is infection or inflammation of small pouches (diverticula) in the   colon that form due to a condition called diverticulosis. Diverticula can trap stool (feces) and bacteria, causing infection and inflammation.  You are at higher risk for this condition if you have diverticulosis and you eat a diet that does not include enough fiber.  Most cases of this condition are mild and can be treated at home. More severe cases may need to be treated at a hospital.  When your condition is under control, your health care provider may recommend that you have an exam called a colonoscopy. This exam can show how severe your diverticula are and whether something else  may be causing your symptoms. This information is not intended to replace advice given to you by your health care provider. Make sure you discuss any questions you have with your health care provider. Document Revised: 11/02/2017 Document Reviewed: 12/23/2016 Elsevier Patient Education  2020 [employer].

## 2020-06-16 NOTE — Progress Notes (Addendum)
PHARMACIST - PHYSICIAN ORDER COMMUNICATION  CONCERNING: P&T Medication Policy on Herbal Medications  DESCRIPTION:  This patient's order for:  Turmeric, N-acetyl L cysteine  has been noted.  This product(s) is classified as an "herbal" or natural product. Due to a lack of definitive safety studies or FDA approval, nonstandard manufacturing practices, plus the potential risk of unknown drug-drug interactions while on inpatient medications, the Pharmacy and Therapeutics Committee does not permit the use of "herbal" or natural products of this type within [facility].   ACTION TAKEN: The pharmacy department is unable to verify this order at this time and your patient has been informed of this safety policy. Please reevaluate patient's clinical condition at discharge and address if the herbal or natural product(s) should be resumed at that time.   Peggyann Juba, PharmD, BCPS Pharmacy: [phone] 06/16/2020 4:38 PM

## 2020-06-16 NOTE — Op Note (Addendum)
06/16/2020  3:28 PM  PATIENT:  [name]  54 y.o. 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 An experienced assistant was required given the standard of surgical care given the complexity of the case.  This assistant was needed for exposure, dissection, suctioning, retraction, instrument exchange, etc.  ANESTHESIA:     General  Nerve block provided with liposomal bupivacaine (Experel) mixed with 0.25% bupivacaine as a Bilateral TAP block x 81mL each side at the level of the transverse abdominis & preperitoneal spaces along the flank at the anterior axillary line, from subcostal ridge to iliac crest under laparoscopic guidance   Local field block at port sites & extraction wound  EBL:  Total I/O In: 1100 [I.V.:1000; IV Piggyback:100] Out: 400 [Urine:300; Blood:100]  Delay start of Pharmacological VTE agent (>24hrs) due to surgical blood loss or risk of bleeding:  no  DRAINS: 19 Fr Blake drain goes to the pelvis  SPECIMEN:   RECTOSIGMOID COLON (open end proximal) DISTAL ANASTOMOTIC RING (final distal margin)  DISPOSITION OF SPECIMEN:  PATHOLOGY  COUNTS:  YES  PLAN OF CARE: Admit to inpatient   PATIENT DISPOSITION:  PACU - hemodynamically stable.  INDICATION:    Patient with recurrent episodes of left-sided diverticulitis.   Most recent event was perforation and abscess requiring drainage and development of chronic fistula.  I recommended segmental resection:  The anatomy & physiology of the digestive tract was discussed.  The pathophysiology was discussed.  Natural history risks without surgery was discussed.   I worked to give an overview of the disease and the frequent need to have multispecialty involvement.  I feel the risks of no intervention will lead to serious problems that outweigh the operative risks; therefore, I recommended a partial colectomy to remove the pathology.  Laparoscopic & open techniques were discussed.   Risks such as bleeding, infection, abscess, leak, reoperation, possible ostomy, hernia, heart attack, death, and other risks were discussed.  I noted a good likelihood this will help address the problem.   Goals of post-operative recovery were discussed as well.  We will work to minimize complications.  Educational materials on the pathology had been given in the office.  Questions were answered.    The patient expressed understanding & wished to proceed with surgery.  OR FINDINGS:   Patient had moderate phlegmon involving mid descending colon.  Percutaneous drain in chronic catheter with small abscess cavity.  Redundant transverse colon with moderate cecal and ascending colon bascule.  No volvulus.  No obvious metastatic disease on visceral parietal peritoneum or liver.  The anastomosis rests 13 cm from the anal verge by rigid proctoscopy.  CASE DATA:  Type of patient?: Elective [facility] Private Case  Status of Case? Elective Scheduled  Infection Present At Time Of Surgery (PATOS)?  PURULENCE  DESCRIPTION:   Informed consent was confirmed.  The patient underwent general anaesthesia without difficulty.  The patient was positioned appropriately.  VTE prevention in place.  The  patient was clipped, prepped, & draped in a sterile fashion.  Surgical timeout confirmed our plan.  Patient was  prepped and draped and Dr. Gloriann Loan did cystoscopy with immunofluorescent firefly injection.  See his separate operative note.  Patient was reprepped and draped.  Our surgical timeout was done.  The patient was positioned in reverse Trendelenburg.  Abdominal entry was gained using Varess technique at the left subcostal ridge on the anterior abdominal wall.  No elevated EtCO2 noted.  Port placed.  Camera inspection revealed small puncture site of liver.  Extra ports were carefully placed under direct laparoscopic visualization.  Hemostasis assured on liver with spatula tip point cautery.  I freed greater omentum off some adhesions to the left anterior abdominal wall.  This allowed me to reflect the greater omentum and the upper abdomen the small bowel in the upper abdomen.  The patient was carefully positioned.  The Intuitive daVinci robot was docked with camera & instruments carefully placed.  The patient had moderate thickening and phlegmon of descending colon.  Some chronically thickened and redundant torturous sigmoid colon as well.  Very floppy right-sided colon with cecum and ascending colon not adherent to the left lateral sidewall at all.  No volvulus.  I mobilized the rectosigmoid colon & elevated it to put the main pedicle on tension.  I scored the base of peritoneum of the medial side of the mesentery of the elevated left colon from the ligament of Treitz to the mid rectum.   I elevated the sigmoid mesentery and entered into the retro-mesenteric plane. We were able to identify the left ureter and gonadal vessels. We kept those posterior within the retroperitoneum and elevated the left colon mesentery off that. I did isolate the inferior mesenteric artery (IMA) pedicle but did not ligate it yet.  I continued distally and got into the avascular plane posterior to the mesorectum, sparing the nervi ergentes.. This allowed me to help mobilize the rectum as well by freeing the mesorectum off the sacrum.  I  stayed away from the right and left ureters.  I kept the lateral vascular pedicles to the rectum intact.  I skeletonized the lymph nodes off the inferior mesenteric artery pedicle.  I went down to its takeoff from the aorta.   I isolated the inferior mesenteric vein off of the ligament of Treitz just cephalad to that as well.  After confirming the left ureter was out of the way, I went ahead and ligated the inferior mesenteric artery pedicle just near its takeoff from the aorta.  I did ligate the inferior mesenteric vein in a similar fashion.  We ensured hemostasis. I mobilized the peritoneal coverings towards the peritoneal reflection on both the right and left sides of the rectum.  I skeletonized the mesorectum at the junction at the proximal rectum for the distal point of resection.  I mobilized the left colon in a lateral to medial fashion off the retroperitoneum and sidewall attachments along the line of Toldt up towards the splenic flexure to ensure good mobilization of the remaining left colon to reach into the pelvis.  I mobilized the left colon lateral to medial fashion.  I used sharp scissors and cautery to free off the chronic phlegmon and abscess cavity on on the left lateral aspect.  I was able to find the pigtail catheter and noted small pocket of purulence consistent with a chronic abscess cavity.  Transected through the pigtail catheter that was intimately associated with the chronic diverticulitis phlegmon.  Without  I could get good mobilization a lateral medial fashion.  However the inflammation continued to the proximal descending colon.  While there was some redundant transverse and right-sided colon the left-sided colon mesentery was foreshortened and things would not reach down well.  Omentum was in a moderate wad here.  I decided to reposition the patient and do splenic flexure mobilization.  I freed the greater omentum off the mid transverse colon.  I follow that towards the splenic  flexure freed the greater great omentum off the left colon and splenic flexure and descending colon.  I freed the splenic flexure retroperitoneal attachments along the inferior pancreatic ridge and upper left kidney.  This allowed much better mobilization.  While there was some redundant colon the actual left mesentery was quite foreshortened most likely due to the chronic inflammation.  I skeletonized the mesorectum and transected at the proximal rectum using a robotic stapler.  I created an extraction incision through a small Pfannenstiel incision in the suprapubic region.  Placed a wound protector.  I was able to eviscerate the rectosigmoid and descending colon out the wound.   I clamped the colon proximal to this area using a reusable pursestringer device.  Passed a 2-0 Keith needle. I transected at the descending/sigmoid junction with a scalpel. I got healthy bleeding mucosa.  We sent the rectosigmoid colon specimen off to go to pathology.  We sized the colon orifice.  I chose a 60mm EEA anvil stapler system.  I reinforced the prolene pursestring with interrupted silk suture.  I placed the anvil to the open end of the proximal remaining colon and closed around it using the pursestring.    We did copious irrigation with crystalloid solution.  Hemostasis was good.  The distal end of the remaining colon easily reached down to the rectal stump.  Dr Marcello Moores scrubbed down and did gentle anal dilation and advanced the EEA stapler up the rectal stump.  There is some narrowing at the peritoneal reflection.  I used laparoscopic scissors to free the peritoneal coverings along the lateral pedicles down toward the peritoneal reflection.  That help free stuff off.  She did rigid proctoscopy to prove no other intraluminal stricture.  However the tip did come out to the inferior corner of the staple line.  The rest the staple line did seem intact.  Were able to get dilators up including a 33.  We decided to go ahead and  bring up the stapler to see if we can get a good anastomosis.  The spike was brought out at the provimal end of the rectal stump at the small inferior corner opening under direct visualization.  I  attached the anvil of the proximal colon the spike of the stapler. Anvil was tightened down and held clamped for 60 seconds. The EEA stapler was fired and held clamped for 30 seconds. The stapler was released & removed. We noted 2 excellent anastomotic rings. Blue stitch is in the proximal ring.  Dr Marcello Moores did rigid proctoscopy noted the anastomosis was at 13 cm from the anal verge consistent with the proximal rectum.  We irrigation of isotonic solution & held that for the pelvic air leak test .  The rectum was insufflated the rectum while clamping the colon proximal to that anastomosis.  There was a negative air leak test. There was no tension of mesentery or bowel at the anastomosis.   Tissues looked viable.  Ureters & bowel uninjured.  The anastomosis looked healthy.  I decided to  leave a drain given the chronic abscess cavity in the left lower side as well as the thinned out corner and initial stapling.  I was able to bring a healthy section of greater omentum to help fill up the pelvis and wrap around the colorectal anastomosis to help protect it.  Endoluminal gas was evacuated.  Ports & wound protector removed.  We changed gloves & redraped the patient per colon SSI prevention protocol.  We aspirated the antibiotic irrigation.  Hemostasis was good.  Sterile unused instruments were used from this point.  I closed the skin at the port sites using Monocryl stitch and sterile dressing.  I closed the extraction wound using a 0 Vicryl vertical peritoneal closure and a #1 PDS transverse anterior rectal fascial closure like a small Pfannenstiel closure. I closed the skin with some interrupted Monocryl stitches. I placed antibiotic-soaked wicks into the closure at the corners x2.  I placed sterile dressings.     Patient  is being extubated go to recovery room. I had discussed postop care with the patient in detail the office & in the holding area. Instructions are written.  I discussed operative findings, updated the patient's status, discussed probable steps to recovery, and gave postoperative recommendations to the patient's spouse.  Recommendations were made.  Questions were answered.  He expressed understanding & appreciation.   [name], M.D., F.A.C.S. Gastrointestinal and Minimally Invasive Surgery [facility] [address], [place], [place] [address] [phone] Main / Paging

## 2020-06-16 NOTE — Anesthesia Preprocedure Evaluation (Addendum)
Anesthesia Evaluation  Patient identified by MRN, date of birth, ID band Patient awake    Reviewed: Allergy & Precautions, NPO status , Patient's Chart, lab work & pertinent test results  Airway Mallampati: II  TM Distance: >3 FB Neck ROM: Full    Dental  (+) Missing   Pulmonary asthma (controlled) , sleep apnea , former smoker,    Pulmonary exam normal breath sounds clear to auscultation       Cardiovascular hypertension, Pt. on medications Normal cardiovascular exam Rhythm:Regular Rate:Normal  ECG: NSR, rate 72   Neuro/Psych  Headaches, PSYCHIATRIC DISORDERS Anxiety Depression ADHD (attention deficit hyperactivity disorder)   GI/Hepatic negative GI ROS, (+)     substance abuse  ,   Endo/Other  negative endocrine ROS  Renal/GU negative Renal ROS     Musculoskeletal negative musculoskeletal ROS (+)   Abdominal   Peds  Hematology negative hematology ROS (+)   Anesthesia Other Findings diverticulitis  Reproductive/Obstetrics                            Anesthesia Physical Anesthesia Plan  ASA: III  Anesthesia Plan: General   Post-op Pain Management:    Induction: Intravenous  PONV Risk Score and Plan: 4 or greater and Ondansetron, Dexamethasone, Midazolam, Scopolamine patch - Pre-op and Treatment may vary due to age or medical condition  Airway Management Planned: Oral ETT  Additional Equipment:   Intra-op Plan:   Post-operative Plan: Extubation in OR  Informed Consent: I have reviewed the patients History and Physical, chart, labs and discussed the procedure including the risks, benefits and alternatives for the proposed anesthesia with the patient or authorized representative who has indicated his/her understanding and acceptance.     Dental advisory given  Plan Discussed with: CRNA  Anesthesia Plan Comments:        Anesthesia Quick Evaluation

## 2020-06-16 NOTE — Interval H&P Note (Signed)
History and Physical Interval Note:  06/16/2020 12:22 PM  [name]  has presented today for surgery, with the diagnosis of diverticulitis.  The various methods of treatment have been discussed with the patient and family. After consideration of risks, benefits and other options for treatment, the patient has consented to  Procedure(s): ROBOTIC RESECTION OF SIGMOID COLON (N/A) RIGID PROCTOSCOPY (N/A) CYSTOSCOPY WITH FIREFLY (N/A) as a surgical intervention.  The patient's history has been reviewed, patient examined, no change in status, stable for surgery.  I have reviewed the patient's chart and labs.  Questions were answered to the patient's satisfaction.     [name]

## 2020-06-17 ENCOUNTER — Encounter (HOSPITAL_COMMUNITY): Payer: Self-pay | Admitting: Surgery

## 2020-06-17 LAB — BASIC METABOLIC PANEL
Anion gap: 8 (ref 5–15)
BUN: 7 mg/dL (ref 6–20)
CO2: 22 mmol/L (ref 22–32)
Calcium: 7.3 mg/dL — ABNORMAL LOW (ref 8.9–10.3)
Chloride: 105 mmol/L (ref 98–111)
Creatinine, Ser: 0.75 mg/dL (ref 0.44–1.00)
GFR calc Af Amer: >60 mL/min (ref >60)
GFR calc non Af Amer: >60 mL/min (ref >60)
Glucose, Bld: 119 mg/dL — ABNORMAL HIGH (ref 70–99)
Potassium: 4.3 mmol/L (ref 3.5–5.1)
Sodium: 135 mmol/L (ref 135–145)

## 2020-06-17 LAB — CBC
HCT: 36 % (ref 36.0–46.0)
Hemoglobin: 11.1 g/dL — ABNORMAL LOW (ref 12.0–15.0)
MCH: 28 pg (ref 26.0–34.0)
MCHC: 30.8 g/dL (ref 30.0–36.0)
MCV: 90.9 fL (ref 80.0–100.0)
Platelets: 204 10*3/uL (ref 150–400)
RBC: 3.96 MIL/uL (ref 3.87–5.11)
RDW: 14.1 % (ref 11.5–15.5)
WBC: 14.7 10*3/uL — ABNORMAL HIGH (ref 4.0–10.5)
nRBC: 0 % (ref 0.0–0.2)

## 2020-06-17 LAB — MAGNESIUM: Magnesium: 1.3 mg/dL — ABNORMAL LOW (ref 1.7–2.4)

## 2020-06-17 MED ORDER — FLUOXETINE HCL 20 MG PO CAPS
60.0000 mg | ORAL_CAPSULE | Freq: Every day | ORAL | Status: DC
  Administered 2020-06-17 – 2020-06-19 (×3): 60 mg via ORAL
  Filled 2020-06-17 (×3): qty 3

## 2020-06-17 MED ORDER — CHLORHEXIDINE GLUCONATE CLOTH 2 % EX PADS: 6.0000 | MEDICATED_PAD | Freq: Every day | CUTANEOUS | Status: DC

## 2020-06-17 NOTE — Addendum Note (Signed)
Addendum  created 06/17/20 0843 by Montel Clock, CRNA   Intraprocedure Staff edited

## 2020-06-17 NOTE — Progress Notes (Signed)
[name] [id number] [birth year]  CARE TEAM:  PCP: Maryland Pink, MD  Outpatient Care Team: Patient Care Team: Maryland Pink, MD as PCP - General (Family Medicine) Ronald Lobo, MD as Consulting Physician (Gastroenterology) Vladimir Crofts, MD as Consulting Physician (Neurology) Ottie Glazier, MD as Consulting Physician (Pulmonary Disease) Michael Boston, MD as Consulting Physician (Colon and Rectal Surgery)  Inpatient Treatment Team: Treatment Team: Attending Provider: Michael Boston, MD; Technician: Leeanne Rio, NT; Technician: Athena Masse, NT; Janeece Riggers: Remigio Eisenmenger; Registered Nurse: Sunny Schlein, RN; Registered Nurse: Jennye Boroughs, RN   Problem List:   Principal Problem:   Diverticulitis with abscess/fistula s/p robotic left colectomy 06/16/2020 Active Problems:   Attention deficit hyperactivity disorder (ADHD), predominantly inattentive type   GAD (generalized anxiety disorder)   MDD (major depressive disorder)   History of alcoholism ([place])   SUI (stress urinary incontinence, female)   Hypertension   History of anemia   GERD (gastroesophageal reflux disease)   1 Day Post-Op  06/16/2020  POST-OPERATIVE DIAGNOSIS:  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  More than 50% of that time was spent in counseling.  06/17/2020    Subjective: (Chief complaint)  Pain in abdomen minimal.  Some low back pain.  Chronic issue.  Walked in hallways.  Had some loose  bowel movements but not nauseated.  Tolerating liquids.  Objective:  Vital signs:  Vitals:   06/16/20 1945 06/16/20 2238 06/17/20 0139 06/17/20 0544  BP: 98/70 (!) 92/56 (!) 91/59 101/72  Pulse:   72 84  Resp: 16 18 16 18   Temp: 97.8 F (36.6 C) 98.3 F (36.8 C) 98.2 F (36.8 C) 98.3 F (36.8 C)  TempSrc: Oral Oral Oral   SpO2: 99% 99% 99% 98%  Weight:      Height:        Last BM Date: 06/16/20  Intake/Output   Yesterday:  07/14 0701 - 07/15 0700 In: 4420.6 [P.O.:790; I.V.:3530.6; IV Piggyback:100] Out: 2550 [Urine:2125; Drains:325; Blood:100] This shift:  No intake/output data recorded.  Bowel function:  Flatus: YES  BM:  YES  Drain: Serosanguinous   Physical Exam:  General: Pt awake/alert in no acute distress.  Smiling, relaxed. Eyes: PERRL, normal EOM.  Sclera clear.  No icterus Neuro: CN II-XII intact w/o focal sensory/motor deficits. Lymph: No head/neck/groin lymphadenopathy Psych:  No delerium/psychosis/paranoia.  Oriented x 4.  Inquisitive.  No anxiety HENT: Normocephalic, Mucus membranes moist.  No thrush Neck: Supple, No tracheal deviation.  No obvious thyromegaly Chest: No pain to chest wall compression.  Good respiratory excursion.  No audible wheezing CV:  Pulses intact.  Regular rhythm.  No major extremity edema MS: Normal AROM mjr joints.  No obvious deformity  Abdomen: Soft.  Nondistended.  Nontender.  No evidence of peritonitis.  No incarcerated hernias.  Ext:  No deformity.  No mjr edema.  No cyanosis Skin: No petechiae / purpurea.  No major sores.  Warm and dry    Results:   Cultures: Recent Results (from the past 720 hour(s))  SARS  CORONAVIRUS 2 (TAT 6-24 HRS) Nasopharyngeal Nasopharyngeal Swab     Status: None   Collection Time: 06/12/20  9:14 AM   Specimen: Nasopharyngeal Swab  Result Value Ref Range Status   SARS Coronavirus 2 NEGATIVE NEGATIVE Final    Comment: (NOTE) SARS-CoV-2 target nucleic acids are NOT DETECTED.  The  SARS-CoV-2 RNA is generally detectable in upper and lower respiratory specimens during the acute phase of infection. Negative results do not preclude SARS-CoV-2 infection, do not rule out co-infections with other pathogens, and should not be used as the sole basis for treatment or other patient management decisions. Negative results must be combined with clinical observations, patient history, and epidemiological information. The expected result is Negative.  Fact Sheet for Patients: SugarRoll.be  Fact Sheet for Healthcare Providers: https://www.woods-mathews.com/  This test is not yet approved or cleared by the [place] FDA and  has been authorized for detection and/or diagnosis of SARS-CoV-2 by FDA under an Emergency Use Authorization (EUA). This EUA will remain  in effect (meaning this test can be used) for the duration of the COVID-19 declaration under Se ction 564(b)(1) of the Act, 21 U.S.C. section 360bbb-3(b)(1), unless the authorization is terminated or revoked sooner.  Performed at [facility], [place] [address], [place], [place] [address]     Labs: Results for orders placed or performed during the hospital encounter of 06/16/20 (from the past 48 hour(s))  Type and screen [place]     Status: None   Collection Time: 06/16/20 10:25 AM  Result Value Ref Range   ABO/RH(D) O POS    Antibody Screen NEG    Sample Expiration      06/19/2020,2359 Performed at [facility], [place] [address] [place], [place] [address]   ABO/Rh     Status: None   Collection Time: 06/16/20 10:30 AM  Result Value Ref Range   ABO/RH(D)      O POS Performed at [facility], [place] [address], [place], [place] [address]   Basic metabolic panel     Status: Abnormal   Collection Time: 06/17/20  4:43 AM  Result Value Ref Range   Sodium 135 135 - 145 mmol/L   Potassium 4.3 3.5 - 5.1 mmol/L     Chloride 105 98 - 111 mmol/L   CO2 22 22 - 32 mmol/L   Glucose, Bld 119 (H) 70 - 99 mg/dL    Comment: Glucose reference range applies only to samples taken after fasting for at least 8 hours.   BUN 7 6 - 20 mg/dL   Creatinine, Ser 0.75 0.44 - 1.00 mg/dL   Calcium 7.3 (L) 8.9 - 10.3 mg/dL   GFR calc non Af Amer >60 >60 mL/min   GFR calc Af Amer >60 >60 mL/min   Anion gap 8 5 - 15    Comment: Performed at [facility], [place] [address], [place], [place] [address]  CBC     Status: Abnormal   Collection Time: 06/17/20  4:43 AM  Result Value Ref Range   WBC 14.7 (H) 4.0 - 10.5 K/uL   RBC 3.96 3.87 - 5.11 MIL/uL   Hemoglobin 11.1 (L) 12.0 - 15.0 g/dL   HCT 36.0 36 - 46 %   MCV 90.9 80.0 - 100.0 fL   MCH 28.0 26.0 - 34.0 pg   MCHC 30.8 30.0 - 36.0 g/dL   RDW 14.1 11.5 - 15.5 %   Platelets 204 150 - 400 K/uL  nRBC 0.0 0.0 - 0.2 %    Comment: Performed at [facility], [place] [address], [place], [place] [address]  Magnesium     Status: Abnormal   Collection Time: 06/17/20  4:43 AM  Result Value Ref Range   Magnesium 1.3 (L) 1.7 - 2.4 mg/dL    Comment: Performed at [facility], [place] [address] [place], [place] [address]    Imaging / Studies: No results found.  Medications / Allergies: per chart  Antibiotics: Anti-infectives (From admission, onward)   Start     Dose/Rate Route Frequency Ordered Stop   06/16/20 2200  cefoTEtan (CEFOTAN) 2 g in sodium chloride 0.9 % 100 mL IVPB        2 g 200 mL/hr over 30 Minutes Intravenous Every 12 hours 06/16/20 1620 06/16/20 2239   06/16/20 1400  neomycin (MYCIFRADIN) tablet 1,000 mg  Status:  Discontinued       "And" Linked Group Details   1,000 mg Oral 3 times per day 06/16/20 1014 06/16/20 1620   06/16/20 1400  metroNIDAZOLE (FLAGYL) tablet 1,000 mg  Status:  Discontinued       "And" Linked Group Details   1,000 mg Oral 3 times per day 06/16/20 1014 06/16/20 1620   06/16/20 1015   cefoTEtan (CEFOTAN) 2 g in sodium chloride 0.9 % 100 mL IVPB        2 g 200 mL/hr over 30 Minutes Intravenous On call to O.R. 06/16/20 1014 06/16/20 1210        Note: Portions of this report may have been transcribed using voice recognition software. Every effort was made to ensure accuracy; however, inadvertent computerized transcription errors may be present.   Any transcriptional errors that result from this process are unintentional.    [name], MD, FACS, MASCRS Gastrointestinal and Minimally Invasive Surgery  [facility] [address] [place], [place] [address] [phone] Fax [phone] Main/Paging  CONTACT INFORMATION: Weekday (9AM-5PM) concerns: Call [facility] main office at [phone] Weeknight (5PM-9AM) or Weekend/Holiday concerns: Check [email or web address] for General Surgery [facility] coverage (Please, do not use SecureChat as it is not reliable communication to operating surgeons for immediate patient care)      06/17/2020  7:56 AM

## 2020-06-18 DIAGNOSIS — G4700 Insomnia, unspecified: Secondary | ICD-10-CM

## 2020-06-18 LAB — SURGICAL PATHOLOGY

## 2020-06-18 MED ORDER — GABAPENTIN 300 MG PO CAPS
600.0000 mg | ORAL_CAPSULE | Freq: Three times a day (TID) | ORAL | Status: DC
  Administered 2020-06-18 – 2020-06-19 (×4): 600 mg via ORAL
  Filled 2020-06-18 (×4): qty 2

## 2020-06-18 MED ORDER — METHOCARBAMOL 1000 MG/10ML IJ SOLN
1000.0000 mg | Freq: Four times a day (QID) | INTRAVENOUS | Status: DC | PRN
  Filled 2020-06-18: qty 10

## 2020-06-18 MED ORDER — FERROUS SULFATE 325 (65 FE) MG PO TABS
325.0000 mg | ORAL_TABLET | Freq: Two times a day (BID) | ORAL | Status: DC
  Administered 2020-06-18 – 2020-06-19 (×3): 325 mg via ORAL
  Filled 2020-06-18 (×3): qty 1

## 2020-06-18 MED ORDER — PSYLLIUM 95 % PO PACK
1.0000 | PACK | Freq: Every day | ORAL | Status: DC
  Filled 2020-06-18: qty 1

## 2020-06-18 MED ORDER — SACCHAROMYCES BOULARDII 250 MG PO CAPS
250.0000 mg | ORAL_CAPSULE | Freq: Two times a day (BID) | ORAL | Status: DC
  Administered 2020-06-18 – 2020-06-19 (×3): 250 mg via ORAL
  Filled 2020-06-18 (×3): qty 1

## 2020-06-18 MED ORDER — OXYCODONE HCL 5 MG PO TABS: 10.0000 mg | ORAL_TABLET | Freq: Four times a day (QID) | ORAL | Status: DC | PRN

## 2020-06-18 MED ORDER — METHOCARBAMOL 500 MG PO TABS: 1000.0000 mg | ORAL_TABLET | Freq: Four times a day (QID) | ORAL | 1 refills | Status: DC | PRN

## 2020-06-18 MED ORDER — BISMUTH SUBSALICYLATE 262 MG/15ML PO SUSP
30.0000 mL | Freq: Three times a day (TID) | ORAL | Status: DC | PRN
  Filled 2020-06-18 (×2): qty 236

## 2020-06-18 MED ORDER — METHOCARBAMOL 500 MG PO TABS
1000.0000 mg | ORAL_TABLET | Freq: Four times a day (QID) | ORAL | Status: DC | PRN
  Administered 2020-06-18 (×2): 1000 mg via ORAL
  Filled 2020-06-18 (×2): qty 2

## 2020-06-18 MED ORDER — PSYLLIUM 95 % PO PACK
1.0000 | PACK | Freq: Two times a day (BID) | ORAL | Status: DC
  Administered 2020-06-18 – 2020-06-19 (×3): 1 via ORAL
  Filled 2020-06-18 (×3): qty 1

## 2020-06-18 MED ORDER — OXYCODONE HCL 5 MG PO TABS
10.0000 mg | ORAL_TABLET | Freq: Four times a day (QID) | ORAL | Status: DC | PRN
  Administered 2020-06-18 (×2): 15 mg via ORAL
  Administered 2020-06-19 (×2): 10 mg via ORAL
  Administered 2020-06-19: 15 mg via ORAL
  Filled 2020-06-18 (×3): qty 3
  Filled 2020-06-18 (×2): qty 2

## 2020-06-18 MED ORDER — GABAPENTIN 300 MG PO CAPS
900.0000 mg | ORAL_CAPSULE | Freq: Every day | ORAL | Status: DC
  Administered 2020-06-18: 900 mg via ORAL
  Filled 2020-06-18: qty 3

## 2020-06-18 NOTE — Progress Notes (Signed)
[name] [id number] Mar 07, 1966  CARE TEAM:  PCP: Maryland Pink, MD  Outpatient Care Team: Patient Care Team: Maryland Pink, MD as PCP - General (Family Medicine) Ronald Lobo, MD as Consulting Physician (Gastroenterology) Vladimir Crofts, MD as Consulting Physician (Neurology) Ottie Glazier, MD as Consulting Physician (Pulmonary Disease) Michael Boston, MD as Consulting Physician (Colon and Rectal Surgery)  Inpatient Treatment Team: Treatment Team: Attending Provider: Michael Boston, MD; Social Worker: Cecil Cobbs; Registered Nurse: Donia Ast, RN; Utilization Review: Micah Noel, RN   Problem List:   Principal Problem:   Diverticulitis with abscess/fistula s/p robotic left colectomy 06/16/2020 Active Problems:   Attention deficit hyperactivity disorder (ADHD), predominantly inattentive type   GAD (generalized anxiety disorder)   MDD (major depressive disorder)   History of alcoholism ([place])   SUI (stress urinary incontinence, female)   Hypertension   History of anemia   GERD (gastroesophageal reflux disease)   2 Days Post-Op  06/16/2020  POST-OPERATIVE DIAGNOSIS:  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   inc gabapentin & oxy doses.  Add robaxin PRN,  IV dilaudid as backup -bowel regimen - fiber, probiotics, iron to thicken & slow down.  Pepto PRN -GERD - PPI -anxiolysis - stable -VTE prophylaxis- SCDs, etc -mobilize as tolerated to help recovery  20 minutes spent in review, evaluation, examination, counseling, and coordination of care.  More than 50% of that time was spent in  counseling.  06/18/2020    Subjective: (Chief complaint)  Pain in abdomen more  Some low back pain.  Chronic issue.  Walked in hallways.  Had more loose bowel movements but not nauseated.  Tolerating soft diet.  Objective:  Vital signs:  Vitals:   06/17/20 2104 06/18/20 0400 06/18/20 0500 06/18/20 0522  BP: (!) 92/52   106/61  Pulse: 85   90  Resp: 18   18  Temp: 98.2 F (36.8 C)   98.9 F (37.2 C)  TempSrc: Oral   Oral  SpO2: 97%   98%  Weight:  69.6 kg 69 kg   Height:        Last BM Date: 06/17/20  Intake/Output   Yesterday:  07/15 0701 - 07/16 0700 In: 1640.8 [P.O.:1440; I.V.:200.8] Out: 60 [Drains:60] This shift:  No intake/output data recorded.  Bowel function:  Flatus: YES  BM:  YES - many loose  Drain: Serosanguinous   Physical Exam:  General: Pt awake/alert in no acute distress.  Smiling, relaxed. Eyes: PERRL, normal EOM.  Sclera clear.  No icterus Neuro: CN II-XII intact w/o focal sensory/motor deficits. Lymph: No head/neck/groin lymphadenopathy Psych:  No delerium/psychosis/paranoia.  Oriented x 4.  Inquisitive.  Mild anxiety HENT: Normocephalic, Mucus membranes moist.  No thrush Neck: Supple, No tracheal deviation.  No obvious thyromegaly Chest: No pain to chest wall compression.  Good respiratory excursion.  No audible wheezing CV:  Pulses intact.  Regular rhythm.  No major extremity edema MS: Normal AROM mjr joints.  No obvious deformity  Abdomen: Soft.  Nondistended.  Nontender.  No evidence of peritonitis.  No incarcerated hernias.  Ext:  No deformity.  No mjr edema.  No cyanosis Skin: No petechiae /  purpurea.  No major sores.  Warm and dry    Results:   Cultures: Recent Results (from the past 720 hour(s))  SARS CORONAVIRUS 2 (TAT 6-24 HRS) Nasopharyngeal Nasopharyngeal Swab     Status: None   Collection Time: 06/12/20  9:14 AM   Specimen: Nasopharyngeal Swab  Result Value Ref Range Status   SARS Coronavirus 2 NEGATIVE  NEGATIVE Final    Comment: (NOTE) SARS-CoV-2 target nucleic acids are NOT DETECTED.  The SARS-CoV-2 RNA is generally detectable in upper and lower respiratory specimens during the acute phase of infection. Negative results do not preclude SARS-CoV-2 infection, do not rule out co-infections with other pathogens, and should not be used as the sole basis for treatment or other patient management decisions. Negative results must be combined with clinical observations, patient history, and epidemiological information. The expected result is Negative.  Fact Sheet for Patients: SugarRoll.be  Fact Sheet for Healthcare Providers: https://www.woods-mathews.com/  This test is not yet approved or cleared by the [place] FDA and  has been authorized for detection and/or diagnosis of SARS-CoV-2 by FDA under an Emergency Use Authorization (EUA). This EUA will remain  in effect (meaning this test can be used) for the duration of the COVID-19 declaration under Se ction 564(b)(1) of the Act, 21 U.S.C. section 360bbb-3(b)(1), unless the authorization is terminated or revoked sooner.  Performed at [facility], [place] [address] [place], [place] [address]     Labs: Results for orders placed or performed during the hospital encounter of 06/16/20 (from the past 48 hour(s))  Type and screen [place]     Status: None   Collection Time: 06/16/20 10:25 AM  Result Value Ref Range   ABO/RH(D) O POS    Antibody Screen NEG    Sample Expiration      06/19/2020,2359 Performed at [facility], [place] [address] [place], [place] [address]   ABO/Rh     Status: None   Collection Time: 06/16/20 10:30 AM  Result Value Ref Range   ABO/RH(D)      O POS Performed at [facility], [place] [address] [place], [place] [address]   Basic metabolic panel     Status: Abnormal   Collection Time: 06/17/20  4:43  AM  Result Value Ref Range   Sodium 135 135 - 145 mmol/L   Potassium 4.3 3.5 - 5.1 mmol/L   Chloride 105 98 - 111 mmol/L   CO2 22 22 - 32 mmol/L   Glucose, Bld 119 (H) 70 - 99 mg/dL    Comment: Glucose reference range applies only to samples taken after fasting for at least 8 hours.   BUN 7 6 - 20 mg/dL   Creatinine, Ser 0.75 0.44 - 1.00 mg/dL   Calcium 7.3 (L) 8.9 - 10.3 mg/dL   GFR calc non Af Amer >60 >60 mL/min   GFR calc Af Amer >60 >60 mL/min   Anion gap 8 5 - 15    Comment: Performed at [facility], [place] [address] [place], [place] [address]  CBC     Status: Abnormal   Collection Time: 06/17/20  4:43 AM  Result Value Ref Range   WBC 14.7 (H) 4.0 - 10.5 K/uL   RBC 3.96 3.87 - 5.11 MIL/uL   Hemoglobin 11.1 (L) 12.0 - 15.0 g/dL   HCT 36.0 36 - 46 %   MCV 90.9 80.0 - 100.0 fL   MCH 28.0 26.0 - 34.0 pg  MCHC 30.8 30.0 - 36.0 g/dL   RDW 14.1 11.5 - 15.5 %   Platelets 204 150 - 400 K/uL   nRBC 0.0 0.0 - 0.2 %    Comment: Performed at [facility], [place] [address] [place], [place] [address]  Magnesium     Status: Abnormal   Collection Time: 06/17/20  4:43 AM  Result Value Ref Range   Magnesium 1.3 (L) 1.7 - 2.4 mg/dL    Comment: Performed at [facility], [place] [address], [place], [place] [address]    Imaging / Studies: No results found.  Medications / Allergies: per chart  Antibiotics: Anti-infectives (From admission, onward)   Start     Dose/Rate Route Frequency Ordered Stop   06/16/20 2200  cefoTEtan (CEFOTAN) 2 g in sodium chloride 0.9 % 100 mL IVPB        2 g 200 mL/hr over 30 Minutes Intravenous Every 12 hours 06/16/20 1620 06/16/20 2239   06/16/20 1400  neomycin (MYCIFRADIN) tablet 1,000 mg  Status:  Discontinued       "And" Linked Group Details   1,000 mg Oral 3 times per day 06/16/20 1014 06/16/20 1620   06/16/20 1400  metroNIDAZOLE (FLAGYL) tablet 1,000 mg  Status:  Discontinued       "And" Linked  Group Details   1,000 mg Oral 3 times per day 06/16/20 1014 06/16/20 1620   06/16/20 1015  cefoTEtan (CEFOTAN) 2 g in sodium chloride 0.9 % 100 mL IVPB        2 g 200 mL/hr over 30 Minutes Intravenous On call to O.R. 06/16/20 1014 06/16/20 1210        Note: Portions of this report may have been transcribed using voice recognition software. Every effort was made to ensure accuracy; however, inadvertent computerized transcription errors may be present.   Any transcriptional errors that result from this process are unintentional.    [name], MD, FACS, MASCRS Gastrointestinal and Minimally Invasive Surgery  [facility] [address], [place], [place] [address] [phone] Fax [phone] Main/Paging  CONTACT INFORMATION: Weekday (9AM-5PM) concerns: Call [facility] main office at [phone] Weeknight (5PM-9AM) or Weekend/Holiday concerns: Check [email or web address] for General Surgery [facility] coverage (Please, do not use SecureChat as it is not reliable communication to operating surgeons for immediate patient care)      06/18/2020  8:03 AM

## 2020-06-19 MED ORDER — OXYCODONE-ACETAMINOPHEN 10-325 MG PO TABS: 1.0000 | ORAL_TABLET | Freq: Four times a day (QID) | ORAL | 0 refills | Status: DC | PRN

## 2020-06-19 NOTE — Discharge Summary (Signed)
Physician Discharge Summary  Patient ID: [name] MRN: [id number] DOB/AGE: 07/02/1966 54 y.o.  Admit date: 06/16/2020 Discharge date: 06/19/2020  Admission Diagnoses: DIVERTICULITIS   Discharge Diagnoses:  Principal Problem:   Diverticulitis with abscess/fistula s/p robotic left colectomy 06/16/2020 Active Problems:   Attention deficit hyperactivity disorder (ADHD), predominantly inattentive type   GAD (generalized anxiety disorder)   MDD (major depressive disorder)   History of alcoholism ([place])   SUI (stress urinary incontinence, female)   Hypertension   History of anemia   GERD (gastroesophageal reflux disease)   Insomnia   Discharged Condition: good  Hospital Course: Patient admitted after robotic assisted descending colectomy.  She did well postoperatively.  This is for chronic diverticulitis by Dr. Johney Maine.  Her diet was advanced over the next 2 days.  She had bowel function.  She had good pain control.  Her incisions are clean dry and intact.  She was tolerating her diet and had good p.o. pain control by postoperative day #3 and was discharged home.  Consults: None    Treatments: surgery: Robotic assisted partial colectomy  Discharge Exam: Blood pressure 106/65, pulse 99, temperature 98.6 F (37 C), temperature source Oral, resp. rate 18, height 5\' 5"  (1.651 m), weight 69 kg, SpO2 97 %. General appearance: alert and cooperative Resp: clear to auscultation bilaterally Cardio: regular rate and rhythm, S1, S2 normal, no murmur, click, rub or gallop Incision/Wound: JP serosanguineous.  Soft nontender.  Incisions clean dry intact.  Dressings in place.  Disposition: Discharge disposition: 01-Home or Self Care       Discharge Instructions    Call MD for:   Complete by: As directed    FEVER > 101.5 F  (temperatures < 101.5 F are not significant)   Call MD for:  extreme fatigue   Complete by: As directed    Call MD for:  persistant dizziness or light-headedness    Complete by: As directed    Call MD for:  persistant nausea and vomiting   Complete by: As directed    Call MD for:  redness, tenderness, or signs of infection (pain, swelling, redness, odor or green/yellow discharge around incision site)   Complete by: As directed    Call MD for:  severe uncontrolled pain   Complete by: As directed    Diet - low sodium heart healthy   Complete by: As directed    Start with a bland diet such as soups, liquids, starchy foods, low fat foods, etc. the first few days at home. Gradually advance to a solid, low-fat, high fiber diet by the end of the first week at home.   Add a fiber supplement to your diet (Metamucil, etc) If you feel full, bloated, or constipated, stay on a full liquid or pureed/blenderized diet for a few days until you feel better and are no longer constipated.   Diet - low sodium heart healthy   Complete by: As directed    Discharge instructions   Complete by: As directed    See Discharge Instructions If you are not getting better after two weeks or are noticing you are getting worse, contact our office [phone] for further advice.  We may need to adjust your medications, re-evaluate you in the office, send you to the emergency room, or see what other things we can do to help. The clinic staff is available to answer your questions during regular business hours (8:30am-5pm).  Please don't hesitate to call and ask to speak to one of  our nurses for clinical concerns.    A surgeon from [facility] is always on call at the hospitals 24 hours/day If you have a medical emergency, go to the nearest emergency room or call 911.   Discharge wound care:   Complete by: As directed    It is good for closed incisions and even open wounds to be washed every day.  Shower every day.  Short baths are fine.  Wash the incisions and wounds clean with soap & water.    You may leave closed incisions open to air if it is dry.   You may cover the  incision with clean gauze & replace it after your daily shower for comfort.   Driving Restrictions   Complete by: As directed    You may drive when: - you are no longer taking narcotic prescription pain medication - you can comfortably wear a seatbelt - you can safely make sudden turns/stops without pain.   Increase activity slowly   Complete by: As directed    Start light daily activities --- self-care, walking, climbing stairs- beginning the day after surgery.  Gradually increase activities as tolerated.  Control your pain to be active.  Stop when you are tired.  Ideally, walk several times a day, eventually an hour a day.   Most people are back to most day-to-day activities in a few weeks.  It takes 4-6 weeks to get back to unrestricted, intense activity. If you can walk 30 minutes without difficulty, it is safe to try more intense activity such as jogging, treadmill, bicycling, low-impact aerobics, swimming, etc. Save the most intensive and strenuous activity for last (Usually 4-8 weeks after surgery) such as sit-ups, heavy lifting, contact sports, etc.  Refrain from any intense heavy lifting or straining until you are off narcotics for pain control.  You will have off days, but things should improve week-by-week. DO NOT PUSH THROUGH PAIN.  Let pain be your guide: If it hurts to do something, don't do it.   Increase activity slowly   Complete by: As directed    Lifting restrictions   Complete by: As directed    If you can walk 30 minutes without difficulty, it is safe to try more intense activity such as jogging, treadmill, bicycling, low-impact aerobics, swimming, etc. Save the most intensive and strenuous activity for last (Usually 4-8 weeks after surgery) such as sit-ups, heavy lifting, contact sports, etc.   Refrain from any intense heavy lifting or straining until you are off narcotics for pain control.  You will have off days, but things should improve week-by-week. DO NOT PUSH THROUGH  PAIN.  Let pain be your guide: If it hurts to do something, don't do it.  Pain is your body warning you to avoid that activity for another week until the pain goes down.   May shower / Bathe   Complete by: As directed    May walk up steps   Complete by: As directed    Remove dressing in 72 hours   Complete by: As directed    Make sure all dressings are removed by the third day after surgery.  Leave incisions open to air.  OK to cover incisions with gauze or bandages as desired   Sexual Activity Restrictions   Complete by: As directed    You may have sexual intercourse when it is comfortable. If it hurts to do something, stop.     Allergies as of 06/19/2020  Reactions   Codeine Nausea And Vomiting, Other (See Comments)   dizziness   Hydrocodone    Dizzy Tolerates oxycodone fine      Medication List    TAKE these medications   acetaminophen 325 MG tablet Commonly known as: TYLENOL Take 2 tablets (650 mg total) by mouth every 6 (six) hours as needed for mild pain (or temp > 100).   Adzenys XR-ODT 12.5 MG Tbed Generic drug: Amphetamine ER Take 25 mg by mouth daily.   Breztri Aerosphere 160-9-4.8 MCG/ACT Aero Generic drug: Budeson-Glycopyrrol-Formoterol Inhale 1 puff into the lungs in the morning and at bedtime.   budesonide 0.5 MG/2ML nebulizer solution Commonly known as: PULMICORT Take 2 mLs by nebulization daily as needed (shortness of breath).   cloNIDine 0.1 MG tablet Commonly known as: CATAPRES Take 0.1 mg by mouth 4 (four) times daily.   FLUoxetine HCl 60 MG Tabs Take 60 mg by mouth daily.   fluticasone 50 MCG/ACT nasal spray Commonly known as: FLONASE Place 2 sprays into the nose daily.   gabapentin 300 MG capsule Commonly known as: NEURONTIN Take 300-900 mg by mouth See admin instructions. Take 1 capsule (300mg ) by mouth three times daily and 3 capsules (900mg ) by mouth at bedtime.   hydrOXYzine 25 MG capsule Commonly known as: VISTARIL Take 25 mg by  mouth 2 (two) times daily as needed for anxiety.   ipratropium-albuterol 0.5-2.5 (3) MG/3ML Soln Commonly known as: DUONEB Take 3 mLs by nebulization every 6 (six) hours as needed. What changed: reasons to take this   methocarbamol 500 MG tablet Commonly known as: ROBAXIN Take 2 tablets (1,000 mg total) by mouth every 6 (six) hours as needed for muscle spasms.   multivitamin with minerals Tabs tablet Take 1 tablet by mouth daily.   N-Acetyl-L-Cysteine 600 MG Caps Take 600 mg by mouth in the morning, at noon, and at bedtime.   ondansetron 4 MG disintegrating tablet Commonly known as: ZOFRAN-ODT Take 1 tablet (4 mg total) by mouth every 6 (six) hours as needed for nausea.   Oxycodone HCl 10 MG Tabs Take 0.5-1 tablets (5-10 mg total) by mouth every 6 (six) hours as needed for moderate pain.   ProAir HFA 108 (90 Base) MCG/ACT inhaler Generic drug: albuterol Inhale 2 puffs into the lungs every 4 (four) hours as needed for wheezing or shortness of breath.   progesterone 100 MG capsule Commonly known as: PROMETRIUM Take 200 mg by mouth daily.   SUMAtriptan 100 MG tablet Commonly known as: IMITREX Take 100 mg by mouth daily as needed for migraine.   topiramate 50 MG tablet Commonly known as: TOPAMAX Take 50 mg by mouth daily.   traZODone 100 MG tablet Commonly known as: DESYREL Take 100 mg by mouth at bedtime as needed for sleep.   Turmeric 500 MG Caps Take 500 mg by mouth daily.   Vitamin D (Ergocalciferol) 1.25 MG (50000 UNIT) Caps capsule Commonly known as: DRISDOL Take 50,000 Units by mouth once a week.            Discharge Care Instructions  (From admission, onward)         Start     Ordered   06/16/20 0000  Discharge wound care:       Comments: It is good for closed incisions and even open wounds to be washed every day.  Shower every day.  Short baths are fine.  Wash the incisions and wounds clean with soap & water.    You may  leave closed incisions open  to air if it is dry.   You may cover the incision with clean gauze & replace it after your daily shower for comfort.   06/16/20 1117          Follow-up Information    Gross, Remo Lipps, MD. Schedule an appointment as soon as possible for a visit in 3 weeks.   Specialty: General Surgery Contact information: [address] [place] [address] [phone]               Signed: Joyice Faster Koreena Joost 06/19/2020, 9:15 AM

## 2020-06-19 NOTE — Progress Notes (Signed)
Pt stable and ready for d/c home with husband. All questions answered and instructions understood.

## 2020-12-27 ENCOUNTER — Other Ambulatory Visit: Payer: Self-pay | Admitting: Family Medicine

## 2020-12-27 DIAGNOSIS — Z1231 Encounter for screening mammogram for malignant neoplasm of breast: Secondary | ICD-10-CM

## 2021-01-04 ENCOUNTER — Emergency Department (HOSPITAL_COMMUNITY)
Admission: EM | Admit: 2021-01-04 | Discharge: 2021-01-05 | Disposition: A | Discharge status: Other Acute Inpatient Hospital | Payer: Commercial Managed Care - PPO | Source: Home / Self Care | Attending: Emergency Medicine | Admitting: Emergency Medicine

## 2021-01-04 ENCOUNTER — Encounter (HOSPITAL_COMMUNITY): Payer: Self-pay | Admitting: *Deleted

## 2021-01-04 DIAGNOSIS — R4182 Altered mental status, unspecified: Secondary | ICD-10-CM | POA: Insufficient documentation

## 2021-01-04 DIAGNOSIS — T492X1A Poisoning by local astringents and local detergents, accidental (unintentional), initial encounter: Secondary | ICD-10-CM | POA: Insufficient documentation

## 2021-01-04 DIAGNOSIS — Z8551 Personal history of malignant neoplasm of bladder: Secondary | ICD-10-CM | POA: Insufficient documentation

## 2021-01-04 DIAGNOSIS — F10129 Alcohol abuse with intoxication, unspecified: Secondary | ICD-10-CM | POA: Insufficient documentation

## 2021-01-04 DIAGNOSIS — F418 Other specified anxiety disorders: Secondary | ICD-10-CM | POA: Insufficient documentation

## 2021-01-04 DIAGNOSIS — T465X1A Poisoning by other antihypertensive drugs, accidental (unintentional), initial encounter: Secondary | ICD-10-CM | POA: Insufficient documentation

## 2021-01-04 DIAGNOSIS — Z79899 Other long term (current) drug therapy: Secondary | ICD-10-CM | POA: Insufficient documentation

## 2021-01-04 DIAGNOSIS — T6594XA Toxic effect of unspecified substance, undetermined, initial encounter: Secondary | ICD-10-CM

## 2021-01-04 DIAGNOSIS — F10920 Alcohol use, unspecified with intoxication, uncomplicated: Secondary | ICD-10-CM

## 2021-01-04 DIAGNOSIS — Z7952 Long term (current) use of systemic steroids: Secondary | ICD-10-CM | POA: Insufficient documentation

## 2021-01-04 DIAGNOSIS — Z87891 Personal history of nicotine dependence: Secondary | ICD-10-CM | POA: Insufficient documentation

## 2021-01-04 DIAGNOSIS — J45909 Unspecified asthma, uncomplicated: Secondary | ICD-10-CM | POA: Insufficient documentation

## 2021-01-04 DIAGNOSIS — Z20822 Contact with and (suspected) exposure to covid-19: Secondary | ICD-10-CM | POA: Insufficient documentation

## 2021-01-04 DIAGNOSIS — T450X1A Poisoning by antiallergic and antiemetic drugs, accidental (unintentional), initial encounter: Secondary | ICD-10-CM | POA: Insufficient documentation

## 2021-01-04 DIAGNOSIS — I10 Essential (primary) hypertension: Secondary | ICD-10-CM | POA: Insufficient documentation

## 2021-01-04 DIAGNOSIS — F909 Attention-deficit hyperactivity disorder, unspecified type: Secondary | ICD-10-CM | POA: Insufficient documentation

## 2021-01-04 LAB — COMPREHENSIVE METABOLIC PANEL
ALT: 14 U/L (ref 0–44)
AST: 18 U/L (ref 15–41)
Albumin: 3.3 g/dL — ABNORMAL LOW (ref 3.5–5.0)
Alkaline Phosphatase: 97 U/L (ref 38–126)
Anion gap: 7 (ref 5–15)
BUN: 9 mg/dL (ref 6–20)
CO2: 25 mmol/L (ref 22–32)
Calcium: 8.5 mg/dL — ABNORMAL LOW (ref 8.9–10.3)
Chloride: 107 mmol/L (ref 98–111)
Creatinine, Ser: 0.77 mg/dL (ref 0.44–1.00)
GFR, Estimated: >60 mL/min (ref >60)
Glucose, Bld: 91 mg/dL (ref 70–99)
Potassium: 4.1 mmol/L (ref 3.5–5.1)
Sodium: 139 mmol/L (ref 135–145)
Total Bilirubin: 0.2 mg/dL — ABNORMAL LOW (ref 0.3–1.2)
Total Protein: 6.2 g/dL — ABNORMAL LOW (ref 6.5–8.1)

## 2021-01-04 LAB — ETHANOL: Alcohol, Ethyl (B): 168 mg/dL — ABNORMAL HIGH (ref <10)

## 2021-01-04 LAB — ACETAMINOPHEN LEVEL: Acetaminophen (Tylenol), Serum: <10 ug/mL — ABNORMAL LOW (ref 10–30)

## 2021-01-04 LAB — CBC
HCT: 39.4 % (ref 36.0–46.0)
Hemoglobin: 12.4 g/dL (ref 12.0–15.0)
MCH: 27.7 pg (ref 26.0–34.0)
MCHC: 31.5 g/dL (ref 30.0–36.0)
MCV: 87.9 fL (ref 80.0–100.0)
Platelets: 222 10*3/uL (ref 150–400)
RBC: 4.48 MIL/uL (ref 3.87–5.11)
RDW: 15.9 % — ABNORMAL HIGH (ref 11.5–15.5)
WBC: 5.1 10*3/uL (ref 4.0–10.5)
nRBC: 0 % (ref 0.0–0.2)

## 2021-01-04 LAB — I-STAT BETA HCG BLOOD, ED (MC, WL, AP ONLY): I-stat hCG, quantitative: <5 m[IU]/mL (ref <5)

## 2021-01-04 LAB — SALICYLATE LEVEL: Salicylate Lvl: <7 mg/dL — ABNORMAL LOW (ref 7.0–30.0)

## 2021-01-04 LAB — CBG MONITORING, ED: Glucose-Capillary: 91 mg/dL (ref 70–99)

## 2021-01-04 MED ORDER — SODIUM CHLORIDE 0.9 % IV BOLUS
1000.0000 mL | Freq: Once | INTRAVENOUS | Status: AC
  Administered 2021-01-04: 1000 mL via INTRAVENOUS

## 2021-01-04 MED ORDER — LACTATED RINGERS IV BOLUS
1000.0000 mL | Freq: Once | INTRAVENOUS | Status: AC
  Administered 2021-01-04: 1000 mL via INTRAVENOUS

## 2021-01-04 NOTE — ED Notes (Signed)
Pt sleeping rise and fall of chest noted.

## 2021-01-04 NOTE — ED Triage Notes (Signed)
Pt arrives via [facility] with some family issues, living in a hotel. Tonight, she wanted to hurt herself "go to sleep and not wake up". 3 Clonidine, 5-6 hydroxizine, 6-8oz [employer], 1 can [employer]. Denies pain, hr 80, 114/78, 98% RA, cbg 116.

## 2021-01-04 NOTE — ED Triage Notes (Signed)
Pt says she is feeling anxious. Pt reports ingestion occurred about 1 hour ago. Took Clonidine 0.1 mg 3 tablets, hydroxizine 25mg  5-6 tablets, drank pine sol. 1 can of alcohol drink. Slurring of speech in triage. Previous SI attempt several years ago.

## 2021-01-04 NOTE — ED Provider Notes (Signed)
[facility] EMERGENCY DEPARTMENT Provider Note   CSN: [id number] Arrival date & time: 01/04/21  2034     History Chief Complaint  Patient presents with  . Ingestion    [name] is a 55 y.o. female with a history of alcohol use disorder, ADHD, anxiety, arthritis, asthma, cutaneous lupus erythematosus, anemia, mild sleep apnea, and major depressive disorder who presents to the emergency department by EMS from a hotel with a chief complaint of ingestion.    The patient reports that her daughter called 911 tonight.  States that she also called last night.  She reports that she drank 1 alcoholic Bahama mama beverage earlier tonight.  She also alleges that she consumed 6 to 8 ounces of Pine-Sol, 5 to 6 tablets of her home 25 mg tablets of hydroxyzine, and 3 tablets of her home 0.1 mg clonidine.  Per EMS, ingestion reportedly occurred around 19:30.   Although she denies SI to me, she states "I might have said earlier that I wanted to hurt myself."  She states that she took her home clonidine as prescribed, which is 1 tablet every 6 hours.  However, she does report that she took "a couple extra tablets" of her home hydroxyzine tonight.  She initially states that she did consume "a capful" of Pine-Sol, but later denies this.  She denies any other illicit or recreational substance use.  She reports that she has been having family issues and has had a strained relationship with her husband.  She denies HI or auditory visual hallucinations.  Patient has no other complaints at this time and "I just want to go to sleep and be left alone".  Level 5 caveat secondary to altered mental status from acute intoxication history is limited secondary to patient coordination.    The history is provided by the patient and medical records. The history is limited by the condition of the patient. No language interpreter was used.       Past Medical History:  Diagnosis Date  . ADHD  (attention deficit hyperactivity disorder)   . Alcohol dependence with acute alcoholic intoxication with complication ([place]) 01/08/8526  . Anemia 1990s  . Anxiety   . Arthritis    neck  . Asthma    uses inhalers every day  . Cancer ([place])    bladder  . Cutaneous lupus erythematosus   . 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 History of anemia   . Severe episode of recurrent major depressive disorder, without psychotic features ([place]) 01/07/2020  . Sleep apnea    mild  . SUI (stress urinary incontinence, female)     Patient Active Problem List   Diagnosis Date Noted  . Insomnia 06/18/2020  . SUI (stress urinary incontinence, female)   . Hypertension   . History of anemia   . GERD (gastroesophageal reflux disease)   . History of alcoholism ([place]) 02/25/2020  . Intractable migraine with aura without status migrainosus 02/25/2020  . Attention deficit hyperactivity disorder (ADHD), predominantly inattentive type 01/07/2020  . GAD (generalized anxiety disorder) 01/07/2020  . Diverticulitis with abscess/fistula s/p robotic left colectomy 06/16/2020 09/06/2019  . MDD (major depressive disorder) 10/07/2015    Past Surgical History:  Procedure Laterality Date  . BLADDER SUSPENSION N/A 02/13/2013   Procedure: [facility] SLING;  Surgeon: Malka So, MD;  Location: [facility];  Service: Urology;  Laterality: N/A;  . CESAREAN SECTION  12-23-2004    W/ LEFT TUBAL LIGATION AND RIGHT  SALPINGECTOMY  . CESAREAN SECTION  1995  . CYSTOSCOPY WITH STENT PLACEMENT Bilateral 06/16/2020   Procedure: CYSTOSCOPY WITH FIREFLY INJECTION;  Surgeon: Lucas Mallow, MD;  Location: [facility];  Service: Urology;  Laterality: Bilateral;  . IR RADIOLOGIST EVAL & MGMT  04/21/2020  . IR RADIOLOGIST EVAL & MGMT  05/12/2020  . ORIF HUMERUS FRACTURE Right 04/28/2014   Procedure: OPEN REDUCTION INTERNAL FIXATION (ORIF) PROXIMAL HUMERUS FRACTURE;  Surgeon: Nita Sells, MD;  Location: [facility];  Service: Orthopedics;  Laterality: Right;  . ORIF PROXIMAL HUMERUS FRACTURE Right 04/28/2014   DR CHANDLER  . PROCTOSCOPY N/A 06/16/2020   Procedure: RIGID PROCTOSCOPY;  Surgeon: Michael Boston, MD;  Location: [facility];  Service: General;  Laterality: N/A;  . TUBAL LIGATION       OB History   No obstetric history on file.     Family History  Adopted: Yes    Social History   Tobacco Use  . Smoking status: Former Smoker    Packs/day: 0.25    Years: 20.00    Pack years: 5.00    Types: Cigarettes    Quit date: 09/24/2019    Years since quitting: 1.2  . Smokeless tobacco: Never Used  Vaping Use  . Vaping Use: Never used  Substance Use Topics  . Alcohol use: Not Currently    Comment: Stopped 12/24/18  . Drug use: No    Home Medications Prior to Admission medications   Medication Sig Start Date End Date Taking? Authorizing Provider  acetaminophen (TYLENOL) 325 MG tablet Take 2 tablets (650 mg total) by mouth every 6 (six) hours as needed for mild pain (or temp > 100). 04/12/20   Meuth, Brooke A, PA-C  Acetylcysteine (N-ACETYL-L-CYSTEINE) 600 MG CAPS Take 600 mg by mouth in the morning, at noon, and at bedtime.    [provider]  ADZENYS XR-ODT 12.5 MG TBED Take 25 mg by mouth daily.  11/28/18   [provider]  BREZTRI AEROSPHERE 160-9-4.8 MCG/ACT AERO Inhale 1 puff into the lungs in the morning and at bedtime. 03/24/20   [provider]  budesonide (PULMICORT) 0.5 MG/2ML nebulizer solution Take 2 mLs by nebulization daily as needed (shortness of breath).  03/17/20   [provider]  cloNIDine (CATAPRES) 0.1 MG tablet Take 0.1 mg by mouth 4 (four) times daily.    [provider]  FLUoxetine HCl 60 MG TABS Take 60 mg by mouth daily. 08/25/19   [provider]  fluticasone (FLONASE) 50 MCG/ACT nasal spray Place 2 sprays into the nose daily.  01/28/19   [provider]  gabapentin (NEURONTIN) 300 MG capsule Take 300-900 mg  by mouth See admin instructions. Take 1 capsule (300mg ) by mouth three times daily and 3 capsules (900mg ) by mouth at bedtime.    [provider]  hydrOXYzine (VISTARIL) 25 MG capsule Take 25 mg by mouth 2 (two) times daily as needed for anxiety. 08/07/19   [provider]  ipratropium-albuterol (DUONEB) 0.5-2.5 (3) MG/3ML SOLN Take 3 mLs by nebulization every 6 (six) hours as needed. Patient taking differently: Take 3 mLs by nebulization every 6 (six) hours as needed (shortness of breath/wheezing).  09/10/19   Tylene Fantasia, PA-C  methocarbamol (ROBAXIN) 500 MG tablet Take 2 tablets (1,000 mg total) by mouth every 6 (six) hours as needed for muscle spasms. 06/18/20   Michael Boston, MD  Multiple Vitamin (MULTIVITAMIN WITH MINERALS) TABS tablet Take 1 tablet by mouth daily. 04/13/20  Meuth, Brooke A, PA-C  ondansetron (ZOFRAN-ODT) 4 MG disintegrating tablet Take 1 tablet (4 mg total) by mouth every 6 (six) hours as needed for nausea. 04/30/20   Norm Parcel, PA-C  oxyCODONE-acetaminophen (PERCOCET) 10-325 MG tablet Take 1 tablet by mouth every 6 (six) hours as needed for pain. 06/19/20 06/19/21  Erroll Luna, MD  PROAIR HFA 108 [phone] Base) MCG/ACT inhaler Inhale 2 puffs into the lungs every 4 (four) hours as needed for wheezing or shortness of breath.  11/24/18   [provider]  progesterone (PROMETRIUM) 100 MG capsule Take 200 mg by mouth daily.  08/25/19   [provider]  SUMAtriptan (IMITREX) 100 MG tablet Take 100 mg by mouth daily as needed for migraine. 03/09/20   [provider]  topiramate (TOPAMAX) 50 MG tablet Take 50 mg by mouth daily. 03/08/20   [provider]  traZODone (DESYREL) 100 MG tablet Take 100 mg by mouth at bedtime as needed for sleep.     [provider]  Turmeric 500 MG CAPS Take 500 mg by mouth daily.    [provider]  Vitamin D, Ergocalciferol, (DRISDOL) 1.25 MG (50000 UNIT) CAPS capsule Take 50,000  Units by mouth once a week. 03/18/20   [provider]    Allergies    Codeine and Hydrocodone  Review of Systems   Review of Systems  Unable to perform ROS: Mental status change    Physical Exam Updated Vital Signs BP 106/63 (BP Location: Left Arm)   Pulse (!) 55   Temp (!) 97.5 F (36.4 C) (Oral)   Resp 18   SpO2 97%   Physical Exam Vitals and nursing note reviewed.  Constitutional:      General: She is not in acute distress.    Appearance: She is not ill-appearing, toxic-appearing or diaphoretic.  HENT:     Head: Normocephalic.     Nose: Nose normal.  Eyes:     Extraocular Movements: Extraocular movements intact.     Conjunctiva/sclera: Conjunctivae normal.     Pupils: Pupils are equal, round, and reactive to light.     Comments: 4+ pupils bilaterally.  Equal round and reactive to light.  Cardiovascular:     Rate and Rhythm: Normal rate and regular rhythm.     Heart sounds: No murmur heard. No friction rub. No gallop.   Pulmonary:     Effort: Pulmonary effort is normal. No respiratory distress.     Breath sounds: No stridor. No wheezing, rhonchi or rales.  Chest:     Chest wall: No tenderness.  Abdominal:     General: There is no distension.     Palpations: Abdomen is soft. There is no mass.     Tenderness: There is no abdominal tenderness. There is no right CVA tenderness, left CVA tenderness, guarding or rebound.     Hernia: No hernia is present.     Comments: Abdomen soft, nontender, nondistended.  Musculoskeletal:     Cervical back: Neck supple.     Right lower leg: No edema.     Left lower leg: No edema.  Skin:    General: Skin is warm.     Findings: No rash.  Neurological:     General: No focal deficit present.     Mental Status: She is alert.     Comments: Speech is minimally slurred.  Moves all 4 extremities.  Answers questions appropriately.  Cranial nerves II through XII are grossly intact.  Good strength against  resistance of the upper  and lower extremities.  Sensation is intact and equal throughout.  Psychiatric:        Behavior: Behavior is uncooperative and agitated.        Thought Content: Thought content does not include homicidal ideation. Thought content does not include homicidal plan.     Comments: Does not appear to be responding to internal stimuli.     ED Results / Procedures / Treatments   Labs (all labs ordered are listed, but only abnormal results are displayed) Labs Reviewed  COMPREHENSIVE METABOLIC PANEL - Abnormal; Notable for the following components:      Result Value   Calcium 8.5 (*)    Total Protein 6.2 (*)    Albumin 3.3 (*)    Total Bilirubin 0.2 (*)    All other components within normal limits  ETHANOL - Abnormal; Notable for the following components:   Alcohol, Ethyl (B) 168 (*)    All other components within normal limits  SALICYLATE LEVEL - Abnormal; Notable for the following components:   Salicylate Lvl [id number] (*)    All other components within normal limits  ACETAMINOPHEN LEVEL - Abnormal; Notable for the following components:   Acetaminophen (Tylenol), Serum <10 (*)    All other components within normal limits  CBC - Abnormal; Notable for the following components:   RDW 15.9 (*)    All other components within normal limits  ACETAMINOPHEN LEVEL - Abnormal; Notable for the following components:   Acetaminophen (Tylenol), Serum <10 (*)    All other components within normal limits  RESP PANEL BY RT-PCR (FLU A&B, COVID) ARPGX2  RAPID URINE DRUG SCREEN, HOSP PERFORMED  CBG MONITORING, ED  I-STAT BETA HCG BLOOD, ED ([facility], [facility], [facility] ONLY)    EKG EKG Interpretation  Date/Time:  Wednesday January 05 2021 03:11:39 EST Ventricular Rate:  60 PR Interval:  150 QRS Duration: 82 QT Interval:  438 QTC Calculation: 438 R Axis:   79 Text Interpretation: Normal sinus rhythm Normal ECG Confirmed by Veryl Speak [id number]) on 01/05/2021 3:34:07 AM   Radiology No results  found.  Procedures Procedures   Medications Ordered in ED Medications  LORazepam (ATIVAN) injection 0-4 mg (0 mg Intravenous Not Given 01/05/21 0302)    Or  LORazepam (ATIVAN) tablet 0-4 mg ( Oral See Alternative 01/05/21 0302)  LORazepam (ATIVAN) injection 0-4 mg (has no administration in time range)    Or  LORazepam (ATIVAN) tablet 0-4 mg (has no administration in time range)  thiamine tablet 100 mg (has no administration in time range)    Or  thiamine (B-1) injection 100 mg (has no administration in time range)  alum & mag hydroxide-simeth (MAALOX/MYLANTA) 200-200-20 MG/5ML suspension 30 mL (has no administration in time range)  ondansetron (ZOFRAN) tablet 4 mg (has no administration in time range)  acetaminophen (TYLENOL) tablet 650 mg (has no administration in time range)  sodium chloride 0.9 % bolus 1,000 mL (0 mLs Intravenous Stopped 01/05/21 0254)  lactated ringers bolus 1,000 mL (0 mLs Intravenous Stopped 01/05/21 0254)    ED Course  I have reviewed the triage vital signs and the nursing notes.  Pertinent labs & imaging results that were available during my care of the patient were reviewed by me and considered in my medical decision making (see chart for details).  Clinical Course as of 01/05/21 0343  Tue Jan 04, 2021  2224 Driscilla Moats with Poison Control Some newer and older bottles of Pine Sol have pine oil. If  she hasn't seized, probably didn't contain Pine Oil. Would have expected immediate vomiting form Pine Sol ingestion. Usually symptoms noted in 3 minutes to 3 hours. For hypotension, give fluids. Obtain 4 hour Tylenol level. Needs observation for 6 hours and repeat EKG prior to medical clearance. [MM]    Clinical Course User Index [MM] Quinne Pires, Laymond Purser, PA-C   MDM Rules/Calculators/A&P                          55 year old female with a history of alcohol use disorder, ADHD, anxiety, arthritis, asthma, cutaneous lupus erythematosus, anemia, mild sleep apnea, and major  depressive disorder presenting by EMS from a hotel for ingestion.  Patient is voluntary at this time.  The patient was seen and independently evaluated by Dr. Francia Greaves, attending physician.  Patient allegedly consumed alcohol, Pine-Sol, clonidine, and hydroxyzine at approximately 19:30.  On exam, speech is mildly slurred and she appears acutely intoxicated.  She does also have 4+ mydriasis bilaterally, but no other focal findings on exam.   No tachycardia.  Blood pressure is 90/62 in the emergency department.  This appears minimally changed from previous blood pressure, but since she did take clonidine we will give IV fluid bolus and closely monitor.  Poison control recommends 6-hour observation from time of ingestion, repeat EKG prior to medically clearing the patient, and a 4-hour Tylenol level.  If she develops hypotension, she should be given IV fluids and benzodiazepines for agitation.  Labs have been reviewed and independently interpreted by me.  Ethanol level is 168.  Initial acetaminophen level is normal.  Salicylate level is normal.   Repeat Tylenol level is normal.  Repeat EKG is reassuring.  She has now been observed for more than 7 hours and has been asymptomatic.  Pt medically cleared at this time. Psych hold orders and home med orders placed. CIWA protocol ordered. Patient remains voluntary at this time.  However, if patient attempts to leave prior to TTS evaluation and recommendations she will need to be IVC need as patient is not forthcoming regarding intent of ingestion.  TTS consult pending; please see psych team notes for further documentation of care/dispo. Pt stable at time of med clearance.     Final Clinical Impression(s) / ED Diagnoses Final diagnoses:  Ingestion of substance, undetermined intent, initial encounter  Alcoholic intoxication without complication [facility])    Rx / DC Orders ED Discharge Orders    None       Joanne Gavel, PA-C 01/05/21 0343    Valarie Merino, MD 01/10/21 [phone]

## 2021-01-05 ENCOUNTER — Other Ambulatory Visit: Payer: Self-pay | Admitting: Psychiatric/Mental Health

## 2021-01-05 ENCOUNTER — Other Ambulatory Visit: Payer: Self-pay

## 2021-01-05 ENCOUNTER — Inpatient Hospital Stay (HOSPITAL_COMMUNITY)
Admission: AD | Admit: 2021-01-05 | Discharge: 2021-01-11 | DRG: 885 | Disposition: A | Discharge status: Home / Self Care | Payer: Commercial Managed Care - PPO | Source: Intra-hospital | Attending: Emergency Medicine | Admitting: Emergency Medicine

## 2021-01-05 DIAGNOSIS — Z9151 Personal history of suicidal behavior: Secondary | ICD-10-CM | POA: Diagnosis not present

## 2021-01-05 DIAGNOSIS — Z7289 Other problems related to lifestyle: Secondary | ICD-10-CM

## 2021-01-05 DIAGNOSIS — F332 Major depressive disorder, recurrent severe without psychotic features: Principal | ICD-10-CM | POA: Diagnosis present

## 2021-01-05 DIAGNOSIS — F909 Attention-deficit hyperactivity disorder, unspecified type: Secondary | ICD-10-CM | POA: Diagnosis present

## 2021-01-05 DIAGNOSIS — L930 Discoid lupus erythematosus: Secondary | ICD-10-CM | POA: Diagnosis present

## 2021-01-05 DIAGNOSIS — F1994 Other psychoactive substance use, unspecified with psychoactive substance-induced mood disorder: Secondary | ICD-10-CM | POA: Diagnosis present

## 2021-01-05 DIAGNOSIS — J449 Chronic obstructive pulmonary disease, unspecified: Secondary | ICD-10-CM | POA: Diagnosis present

## 2021-01-05 DIAGNOSIS — F10139 Alcohol abuse with withdrawal, unspecified: Secondary | ICD-10-CM | POA: Diagnosis present

## 2021-01-05 DIAGNOSIS — T43592A Poisoning by other antipsychotics and neuroleptics, intentional self-harm, initial encounter: Secondary | ICD-10-CM | POA: Diagnosis present

## 2021-01-05 DIAGNOSIS — N951 Menopausal and female climacteric states: Secondary | ICD-10-CM | POA: Diagnosis present

## 2021-01-05 DIAGNOSIS — T65892A Toxic effect of other specified substances, intentional self-harm, initial encounter: Secondary | ICD-10-CM | POA: Diagnosis present

## 2021-01-05 DIAGNOSIS — F419 Anxiety disorder, unspecified: Secondary | ICD-10-CM | POA: Diagnosis present

## 2021-01-05 DIAGNOSIS — F10129 Alcohol abuse with intoxication, unspecified: Secondary | ICD-10-CM | POA: Diagnosis present

## 2021-01-05 DIAGNOSIS — F1310 Sedative, hypnotic or anxiolytic abuse, uncomplicated: Secondary | ICD-10-CM | POA: Diagnosis present

## 2021-01-05 DIAGNOSIS — F17290 Nicotine dependence, other tobacco product, uncomplicated: Secondary | ICD-10-CM | POA: Diagnosis present

## 2021-01-05 DIAGNOSIS — Z79899 Other long term (current) drug therapy: Secondary | ICD-10-CM

## 2021-01-05 DIAGNOSIS — Z7951 Long term (current) use of inhaled steroids: Secondary | ICD-10-CM

## 2021-01-05 DIAGNOSIS — M1990 Unspecified osteoarthritis, unspecified site: Secondary | ICD-10-CM | POA: Diagnosis present

## 2021-01-05 DIAGNOSIS — D649 Anemia, unspecified: Secondary | ICD-10-CM | POA: Diagnosis present

## 2021-01-05 DIAGNOSIS — F32A Depression, unspecified: Secondary | ICD-10-CM

## 2021-01-05 DIAGNOSIS — Z560 Unemployment, unspecified: Secondary | ICD-10-CM

## 2021-01-05 DIAGNOSIS — Z20822 Contact with and (suspected) exposure to covid-19: Secondary | ICD-10-CM | POA: Diagnosis present

## 2021-01-05 DIAGNOSIS — E559 Vitamin D deficiency, unspecified: Secondary | ICD-10-CM | POA: Diagnosis present

## 2021-01-05 DIAGNOSIS — Z885 Allergy status to narcotic agent status: Secondary | ICD-10-CM

## 2021-01-05 DIAGNOSIS — R251 Tremor, unspecified: Secondary | ICD-10-CM | POA: Diagnosis present

## 2021-01-05 DIAGNOSIS — F1090 Alcohol use, unspecified, uncomplicated: Secondary | ICD-10-CM

## 2021-01-05 DIAGNOSIS — Z789 Other specified health status: Secondary | ICD-10-CM

## 2021-01-05 DIAGNOSIS — G43909 Migraine, unspecified, not intractable, without status migrainosus: Secondary | ICD-10-CM | POA: Diagnosis present

## 2021-01-05 LAB — RESP PANEL BY RT-PCR (FLU A&B, COVID) ARPGX2
Influenza A by PCR: NEGATIVE
Influenza B by PCR: NEGATIVE
SARS Coronavirus 2 by RT PCR: NEGATIVE

## 2021-01-05 LAB — ACETAMINOPHEN LEVEL: Acetaminophen (Tylenol), Serum: <10 ug/mL — ABNORMAL LOW (ref 10–30)

## 2021-01-05 MED ORDER — ALUM & MAG HYDROXIDE-SIMETH 200-200-20 MG/5ML PO SUSP: 30.0000 mL | ORAL | Status: DC | PRN

## 2021-01-05 MED ORDER — THIAMINE HCL 100 MG PO TABS
100.0000 mg | ORAL_TABLET | Freq: Every day | ORAL | Status: DC
  Administered 2021-01-06 – 2021-01-11 (×6): 100 mg via ORAL
  Filled 2021-01-05 (×9): qty 1

## 2021-01-05 MED ORDER — GABAPENTIN 300 MG PO CAPS
300.0000 mg | ORAL_CAPSULE | Freq: Three times a day (TID) | ORAL | Status: DC
  Administered 2021-01-06 (×2): 300 mg via ORAL
  Filled 2021-01-05 (×8): qty 1

## 2021-01-05 MED ORDER — ACETAMINOPHEN 325 MG PO TABS: 650.0000 mg | ORAL_TABLET | ORAL | Status: DC | PRN

## 2021-01-05 MED ORDER — BUDESONIDE 0.5 MG/2ML IN SUSP
0.5000 mg | Freq: Every day | RESPIRATORY_TRACT | Status: DC | PRN
  Filled 2021-01-05: qty 2

## 2021-01-05 MED ORDER — LORAZEPAM 2 MG/ML IJ SOLN: 0.0000 mg | Freq: Two times a day (BID) | INTRAMUSCULAR | Status: DC

## 2021-01-05 MED ORDER — LOPERAMIDE HCL 2 MG PO CAPS: 2.0000 mg | ORAL_CAPSULE | ORAL | Status: AC | PRN

## 2021-01-05 MED ORDER — ZIPRASIDONE MESYLATE 20 MG IM SOLR: 20.0000 mg | Freq: Every day | INTRAMUSCULAR | Status: DC | PRN

## 2021-01-05 MED ORDER — HYDROXYZINE HCL 50 MG PO TABS: 25.0000 mg | ORAL_TABLET | Freq: Two times a day (BID) | ORAL | Status: DC | PRN

## 2021-01-05 MED ORDER — CLONIDINE HCL 0.1 MG PO TABS
0.1000 mg | ORAL_TABLET | Freq: Four times a day (QID) | ORAL | Status: DC
  Administered 2021-01-06 – 2021-01-08 (×7): 0.1 mg via ORAL
  Filled 2021-01-05 (×19): qty 1

## 2021-01-05 MED ORDER — THIAMINE HCL 100 MG/ML IJ SOLN: 100.0000 mg | Freq: Every day | INTRAMUSCULAR | Status: DC

## 2021-01-05 MED ORDER — FLUOXETINE HCL 20 MG PO CAPS
60.0000 mg | ORAL_CAPSULE | Freq: Every day | ORAL | Status: DC
  Administered 2021-01-05: 60 mg via ORAL
  Filled 2021-01-05: qty 3

## 2021-01-05 MED ORDER — PROPRANOLOL HCL 10 MG PO TABS: 10.0000 mg | ORAL_TABLET | Freq: Two times a day (BID) | ORAL | Status: DC | PRN

## 2021-01-05 MED ORDER — ACETAMINOPHEN 325 MG PO TABS: 650.0000 mg | ORAL_TABLET | Freq: Four times a day (QID) | ORAL | Status: DC | PRN

## 2021-01-05 MED ORDER — LORAZEPAM 2 MG/ML IJ SOLN: 0.0000 mg | Freq: Four times a day (QID) | INTRAMUSCULAR | Status: DC

## 2021-01-05 MED ORDER — ALUM & MAG HYDROXIDE-SIMETH 200-200-20 MG/5ML PO SUSP: 30.0000 mL | Freq: Four times a day (QID) | ORAL | Status: DC | PRN

## 2021-01-05 MED ORDER — MAGNESIUM HYDROXIDE 400 MG/5ML PO SUSP: 30.0000 mL | Freq: Every day | ORAL | Status: DC | PRN

## 2021-01-05 MED ORDER — THIAMINE HCL 100 MG PO TABS
100.0000 mg | ORAL_TABLET | Freq: Every day | ORAL | Status: DC
  Administered 2021-01-05: 100 mg via ORAL
  Filled 2021-01-05: qty 1

## 2021-01-05 MED ORDER — PROPRANOLOL HCL 10 MG PO TABS
10.0000 mg | ORAL_TABLET | ORAL | Status: DC | PRN
  Filled 2021-01-05: qty 1

## 2021-01-05 MED ORDER — LIOTHYRONINE SODIUM 5 MCG PO TABS
10.0000 ug | ORAL_TABLET | Freq: Every day | ORAL | Status: DC
  Administered 2021-01-06: 10 ug via ORAL
  Filled 2021-01-05 (×3): qty 2

## 2021-01-05 MED ORDER — OLANZAPINE 5 MG PO TBDP
10.0000 mg | ORAL_TABLET | Freq: Two times a day (BID) | ORAL | Status: DC | PRN
  Filled 2021-01-05: qty 2

## 2021-01-05 MED ORDER — ONDANSETRON HCL 4 MG PO TABS: 4.0000 mg | ORAL_TABLET | Freq: Three times a day (TID) | ORAL | Status: DC | PRN

## 2021-01-05 MED ORDER — ONDANSETRON 4 MG PO TBDP: 4.0000 mg | ORAL_TABLET | Freq: Four times a day (QID) | ORAL | Status: AC | PRN

## 2021-01-05 MED ORDER — GABAPENTIN 300 MG PO CAPS
900.0000 mg | ORAL_CAPSULE | Freq: Every day | ORAL | Status: DC
  Administered 2021-01-05: 900 mg via ORAL
  Filled 2021-01-05: qty 3

## 2021-01-05 MED ORDER — OLANZAPINE 10 MG PO TBDP: 10.0000 mg | ORAL_TABLET | Freq: Three times a day (TID) | ORAL | Status: DC | PRN

## 2021-01-05 MED ORDER — LORAZEPAM 1 MG PO TABS
1.0000 mg | ORAL_TABLET | ORAL | Status: AC | PRN
  Administered 2021-01-06: 1 mg via ORAL
  Filled 2021-01-05: qty 1

## 2021-01-05 MED ORDER — CLONIDINE HCL 0.1 MG PO TABS
0.1000 mg | ORAL_TABLET | Freq: Four times a day (QID) | ORAL | Status: DC
  Administered 2021-01-05 (×2): 0.1 mg via ORAL
  Filled 2021-01-05 (×2): qty 1

## 2021-01-05 MED ORDER — ZIPRASIDONE MESYLATE 20 MG IM SOLR
20.0000 mg | INTRAMUSCULAR | Status: DC | PRN
Start: 2021-01-05 — End: 2021-01-11

## 2021-01-05 MED ORDER — FLUOXETINE HCL 20 MG PO CAPS
60.0000 mg | ORAL_CAPSULE | Freq: Every day | ORAL | Status: DC
  Administered 2021-01-06 – 2021-01-11 (×6): 60 mg via ORAL
  Filled 2021-01-05 (×9): qty 3

## 2021-01-05 MED ORDER — LORAZEPAM 1 MG PO TABS
1.0000 mg | ORAL_TABLET | Freq: Four times a day (QID) | ORAL | Status: AC | PRN
Start: 2021-01-05 — End: 2021-01-08
  Administered 2021-01-06: 1 mg via ORAL
  Filled 2021-01-05: qty 1

## 2021-01-05 MED ORDER — GABAPENTIN 300 MG PO CAPS
300.0000 mg | ORAL_CAPSULE | Freq: Three times a day (TID) | ORAL | Status: DC
  Administered 2021-01-05: 300 mg via ORAL
  Filled 2021-01-05: qty 1

## 2021-01-05 MED ORDER — PROGESTERONE MICRONIZED 100 MG PO CAPS
200.0000 mg | ORAL_CAPSULE | Freq: Every day | ORAL | Status: DC
  Administered 2021-01-05: 200 mg via ORAL
  Filled 2021-01-05: qty 2

## 2021-01-05 MED ORDER — ALBUTEROL SULFATE HFA 108 (90 BASE) MCG/ACT IN AERS
2.0000 | INHALATION_SPRAY | Freq: Once | RESPIRATORY_TRACT | Status: AC
  Administered 2021-01-05: 2 via RESPIRATORY_TRACT
  Filled 2021-01-05: qty 6.7

## 2021-01-05 MED ORDER — FLUTICASONE FUROATE-VILANTEROL 100-25 MCG/INH IN AEPB
1.0000 | INHALATION_SPRAY | Freq: Every day | RESPIRATORY_TRACT | Status: DC
  Administered 2021-01-06 – 2021-01-11 (×6): 1 via RESPIRATORY_TRACT
  Filled 2021-01-05: qty 28

## 2021-01-05 MED ORDER — GABAPENTIN 300 MG PO CAPS: 300.0000 mg | ORAL_CAPSULE | ORAL | Status: DC

## 2021-01-05 MED ORDER — UMECLIDINIUM BROMIDE 62.5 MCG/INH IN AEPB
1.0000 | INHALATION_SPRAY | Freq: Every day | RESPIRATORY_TRACT | Status: DC
  Filled 2021-01-05: qty 7

## 2021-01-05 MED ORDER — HYDROXYZINE HCL 25 MG PO TABS: 25.0000 mg | ORAL_TABLET | Freq: Four times a day (QID) | ORAL | Status: AC | PRN

## 2021-01-05 MED ORDER — LORAZEPAM 1 MG PO TABS: 0.0000 mg | ORAL_TABLET | Freq: Two times a day (BID) | ORAL | Status: DC

## 2021-01-05 MED ORDER — N-ACETYL-L-CYSTEINE 600 MG PO CAPS: 600.0000 mg | ORAL_CAPSULE | Freq: Three times a day (TID) | ORAL | Status: DC

## 2021-01-05 MED ORDER — BUDESON-GLYCOPYRROL-FORMOTEROL 160-9-4.8 MCG/ACT IN AERO: 1.0000 | INHALATION_SPRAY | Freq: Two times a day (BID) | RESPIRATORY_TRACT | Status: DC

## 2021-01-05 MED ORDER — UMECLIDINIUM BROMIDE 62.5 MCG/INH IN AEPB
1.0000 | INHALATION_SPRAY | Freq: Every day | RESPIRATORY_TRACT | Status: DC
  Administered 2021-01-06 – 2021-01-11 (×6): 1 via RESPIRATORY_TRACT
  Filled 2021-01-05: qty 7

## 2021-01-05 MED ORDER — HYDROXYZINE HCL 25 MG PO TABS: 25.0000 mg | ORAL_TABLET | Freq: Three times a day (TID) | ORAL | Status: DC | PRN

## 2021-01-05 MED ORDER — LIOTHYRONINE SODIUM 5 MCG PO TABS
10.0000 ug | ORAL_TABLET | Freq: Every day | ORAL | Status: DC
  Administered 2021-01-05: 10 ug via ORAL
  Filled 2021-01-05: qty 2

## 2021-01-05 MED ORDER — TRAZODONE HCL 100 MG PO TABS
100.0000 mg | ORAL_TABLET | Freq: Every evening | ORAL | Status: DC | PRN
  Administered 2021-01-06 – 2021-01-10 (×4): 100 mg via ORAL
  Filled 2021-01-05 (×5): qty 1

## 2021-01-05 MED ORDER — LORAZEPAM 1 MG PO TABS: 0.0000 mg | ORAL_TABLET | Freq: Four times a day (QID) | ORAL | Status: DC

## 2021-01-05 MED ORDER — FLUTICASONE FUROATE-VILANTEROL 100-25 MCG/INH IN AEPB
1.0000 | INHALATION_SPRAY | Freq: Every day | RESPIRATORY_TRACT | Status: DC
  Filled 2021-01-05: qty 28

## 2021-01-05 MED ORDER — TRAZODONE HCL 50 MG PO TABS: 100.0000 mg | ORAL_TABLET | Freq: Every evening | ORAL | Status: DC | PRN

## 2021-01-05 MED ORDER — PROGESTERONE 200 MG PO CAPS
200.0000 mg | ORAL_CAPSULE | Freq: Every day | ORAL | Status: DC
  Administered 2021-01-06 – 2021-01-11 (×6): 200 mg via ORAL
  Filled 2021-01-05 (×8): qty 1

## 2021-01-05 MED ORDER — ADULT MULTIVITAMIN W/MINERALS CH
1.0000 | ORAL_TABLET | Freq: Every day | ORAL | Status: DC
  Administered 2021-01-06 – 2021-01-11 (×6): 1 via ORAL
  Filled 2021-01-05 (×9): qty 1

## 2021-01-05 MED ORDER — BUDESONIDE 0.5 MG/2ML IN SUSP
2.0000 mL | Freq: Every day | RESPIRATORY_TRACT | Status: DC | PRN
  Filled 2021-01-05: qty 2

## 2021-01-05 NOTE — ED Provider Notes (Signed)
  Physical Exam  BP 140/85   Pulse 60   Temp 98.2 F (36.8 C) (Oral)   Resp 18   SpO2 100%   Physical Exam  ED Course/Procedures   Clinical Course as of 01/05/21 1750  Tue Jan 04, 2021  2224 Driscilla Moats with Poison Control Some newer and older bottles of Pine Sol have pine oil. If she hasn't seized, probably didn't contain Pine Oil. Would have expected immediate vomiting form Pine Sol ingestion. Usually symptoms noted in 3 minutes to 3 hours. For hypotension, give fluids. Obtain 4 hour Tylenol level. Needs observation for 6 hours and repeat EKG prior to medical clearance. [MM]    Clinical Course User Index [MM] McDonald, Mia A, PA-C    Procedures  MDM   Received care at [place] from previous providers.  Briefly, this is a 55 year old female presented after her daughter had called 57 with concern for ingestion and concern for suicide attempt.  She had reported taking clonidine, hydralazine, and Pine-Sol initially, but then reported she did not.  She has been here voluntarily, and psychiatry was consulted who recommended inpatient care.  Since then, she had become agitated, throwing things.  On my evaluation, she is hopeless.  Reports that "nothing that you do will help" "I have been through this before and it did not help then" "I am just going to go home and keep drinking" then she reports if she really wanted to kill herself, she would have "hung myself with these cords here hours ago."  I am concerned regarding her ongoing hopelessness and initial presentation as ingestion for which her family called EMS.  Given concern for her safety I have placed her under IVC to receive care at [facility].    Gareth Morgan, MD 01/05/21 2258

## 2021-01-05 NOTE — BH Assessment (Addendum)
Comprehensive Clinical Assessment (CCA) Note   [name] is a 55 y.o. female with a history of alcohol use disorder, ADHD, anxiety, and major depressive disorder. She presents to  [facility] Emergency Department by EMS from her home. EDP notes that patient's chief complaint is ingestion. Upon chart review: "The patient reports that her daughter called 911 tonight.  States that she also called last night.  She reports that she drank 1 alcoholic Bahama mama beverage earlier tonight.  She also alleges that she consumed 6 to 8 ounces of Pine-Sol, 5 to 6 tablets of her home 25 mg tablets of hydroxyzine, and 3 tablets of her home 0.1 mg clonidine.  Per EMS, ingestion reportedly occurred around 19:30".   When asked what brought her to the Emergency Department she states, "I had no other place to go". When asked how did EMS become involved. She states, "I don't know who called 911/EMS".  She explains:  "The police just showed up at my door and told me I had to go to the Emergency Department...Marland KitchenMarland KitchenThey were afraid I would hurt myself". Patient later reports that her daughter was probably the person to call 911/EMS. States that her daughter has been worried about her wellbeing.   Patient reports leaving her home that shares with her spouse, last Monday. She has since been staying in a hotel room. Patient and spouse decided to separate last week. States that she was hoping to have some time to herself because the separation is difficult for her to handle.    Patient denies current suicidal ideations. She denies the allegations of overdosing. States that her daughter saw the medications in her hotel room, yesterday. She believes that her daughter assumed that she was going to overdose. Patient adamantly denies that she wanted to hurt herself and/or tried to hurt herself. States, "I had my fucking blood pressure medications on the dresser, next to the bed, so why jump to the conclusion I'm trying to commit  suicide".She reports #1 prior suicide attempt, "yrs ago". States that she doesn't remember how she tried to hurt herself. However, recalls the trigger for her past suicide was anxiety. No history of self-mutilating behaviors.   Most significant stressor outside of her separation is loosing her job. States that she didn't show up to her job last night because she was made to come in the hospital.  Current depressive symptoms include: angry/irritability, isolating self from others, hopelessness, worthlessness, fatigue, and insomnia. She sleeps anytime she is not at working. However, x2 days she has not slept at all. He appetite is poor. She doesn't recall the last time that she has eaten. No significant weight loss reported. Patient reports on-going issues with severe anxiety and panic attacks. She doesn't know if she has a family history of mental health illness. States that she was adopted. Denies a history of trauma and/or abuse.   Denies homicidal ideations. Denies history of aggressive and/or assaultive behaviors. No criminal charges pending and/or court dates. However, reports obtaining a DUI in 2020.  Denies AVH's.  She denies drug use. However, does report a significant history of alcohol use to help her cope with stress/anxiety. She started drinking at the age of 55 yrs old . She drinks #2 [facility]), every night before bed. She stopped drinking for several months but restarted back,  November 2021. States, "I am an alcoholic and I've had a problem for years". She participated in substance abuse residential program [employer]) in 2013 and 2014.  She stayed in the program for 90 days on each occasion and then transferred to a [facility] after her 90 days of treatment. Longest period of sobriety is 6 months. Denies a history of seizures but has a history of DT's. No current withdrawal symptoms reported.   Her current outpatient psychiatrist is Dr. Toy Kline. She is compliant with  psychiatric medications. She does not participate in outpatient therapy. She has a history of inpatient psychiatric treatment at [facility] (2014 & 2015). Upon chart review patient was in the [facility] Emergency Departments on  11/26/2012 for Anxiety, 03/28/2013 for Depression/Alcohol Intoxication, 10/06/2015 for Suicidal ideations/Alcohol Abuse, 12/17/2015 Alcohol Intoxication.   Disposition: Per Mary Sine, NP, patient meets criteria for INPT treatment. LCSW to seek appropriate placement.   01/05/2021 [name] [id number]  Chief Complaint:  Chief Complaint  Patient presents with  . Psychiatric Evaluation   Visit Diagnosis:  Major Depressive Disorder, Recurrent, Severe, without psychotic features, Anxiety Disorder, Alcohol Use Disorder-Severe   CCA Screening, Triage and Referral (STR)  Patient Reported Information How did you hear about [place]? No data recorded Referral name: EMS/911  Referral phone number: No data recorded  Whom do you see for routine medical problems? -- (unk)  Practice/Facility Name: No data recorded Practice/Facility Phone Number: No data recorded Name of Contact: No data recorded Contact Number: No data recorded Contact Fax Number: No data recorded Prescriber Name: No data recorded Prescriber Address (if known): No data recorded  What Is the Reason for Your Visit/Call Today? No data recorded How Long Has This Been Causing You Problems? > than 6 months  What Do You Feel Would Help You the Most Today? Assessment Only   Have You Recently Been in Any Inpatient Treatment (Hospital/Detox/Crisis Center/28-Day Program)? No  Name/Location of Program/Hospital:No data recorded How Long Were You There? No data recorded When Were You Discharged? No data recorded  Have You Ever Received Services From [facility] Before? Yes  Who Do You See at [facility]? She has a history of inpatient psychiatric treatment at [facility] (2014 & 2015). Upon chart review patient was in the  [facility] Emergency Departments on 11/26/2012 for Anxiety, 03/28/2013 for Depression/Alcohol Intoxication, 10/06/2015 for Suicidal ideations/Alcohol Abuse, 12/17/2015 Alcohol Intoxication/Delirum.   Have You Recently Had Any Thoughts About Hurting Yourself? No  Are You Planning to Commit Suicide/Harm Yourself At This time? No   Have you Recently Had Thoughts About [place]? No  Explanation: No data recorded  Have You Used Any Alcohol or Drugs in the Past 24 Hours? No  How Long Ago Did You Use Drugs or Alcohol? No data recorded What Did You Use and How Much? No data recorded  Do You Currently Have a Therapist/Psychiatrist? Yes  Name of Therapist/Psychiatrist: Dr. Kerin Salen   Have You Been Recently Discharged From Any Office Practice or Programs? No  Explanation of Discharge From Practice/Program: No data recorded    CCA Screening Triage Referral Assessment Type of Contact: Tele-Assessment  Is this Initial or Reassessment? Initial Assessment  Date Telepsych consult ordered in [facility]:  01/05/2021  Time Telepsych consult ordered in [facility]:  No data recorded  Patient Reported Information Reviewed? Yes  Patient Left Without Being Seen? No data recorded Reason for Not Completing Assessment: No data recorded  Collateral Involvement: Spouse   Does Patient Have a Court Appointed Legal Guardian? No data recorded Name and Contact of Legal Guardian: No data recorded If Minor and Not Living with Parent(s), Who has Custody? Spouse Dominica Severin  Pearline Cables [phone].  Is CPS involved or ever been involved? Never  Is APS involved or ever been involved? Never   Patient Determined To Be At Risk for Harm To Self or Others Based on Review of Patient Reported Information or Presenting Complaint? No  Method: No data recorded Availability of Means: No data recorded Intent: No data recorded Notification Required: No data recorded Additional Information for Danger to Others Potential:  No data recorded Additional Comments for Danger to Others Potential: No data recorded Are There Guns or Other Weapons in Your Home? No data recorded Types of Guns/Weapons: No data recorded Are These Weapons Safely Secured?                            No data recorded Who Could Verify You Are Able To Have These Secured: No data recorded Do You Have any Outstanding Charges, Pending Court Dates, Parole/Probation? No data recorded Contacted To Inform of Risk of Harm To Self or Others: No data recorded  Location of Assessment: [facility] ED   Does Patient Present under Involuntary Commitment? No  IVC Papers Initial File Date: No data recorded  [place] of Residence: [place]   Patient Currently Receiving the Following Services: Medication Management   Determination of Need: Emergent (2 hours)   Options For Referral: Outpatient Therapy; Medication Management; Chemical Dependency Intensive Outpatient Therapy (CDIOP); Inpatient Hospitalization     CCA Biopsychosocial Intake/Chief Complaint:  The patient reports that her daughter called 911 tonight.  States that she also called last night.  She reports that she drank 1 alcoholic Bahama mama beverage earlier tonight.  She also alleges that she consumed 6 to 8 ounces of Pine-Sol, 5 to 6 tablets of her home 25 mg tablets of hydroxyzine, and 3 tablets of her home 0.1 mg clonidine.  Per EMS, ingestion reportedly occurred around 19:30.    Although she denies SI to me, she states "I might have said earlier that I wanted to hurt myself."  She states that she took her home clonidine as prescribed, which is 1 tablet every 6 hours.  However, she does report that she took "a couple extra tablets" of her home hydroxyzine tonight.  She initially states that she did consume "a capful" of Pine-Sol, but later denies this.   She denies any other illicit or recreational substance use.  She reports that she has been having family issues and has had a strained relationship  with her husband.  She denies HI or auditory visual hallucinations.   Patient has no other complaints at this time and "I just want to go to sleep and be left alone".  Current Symptoms/Problems: Anxiety and Depression   Patient Reported Schizophrenia/Schizoaffective Diagnosis in Past: No   Strengths: unk  Preferences: unk  Abilities: unk   Type of Services Patient Feels are Needed: unk   Initial Clinical Notes/Concerns: unk   Mental Health Symptoms Depression:  Hopelessness; Fatigue; Change in energy/activity; Increase/decrease in appetite; Irritability; Sleep (too much or little); Worthlessness   Duration of Depressive symptoms: Less than two weeks   Mania:  Change in energy/activity; Increased Energy; Irritability   Anxiety:   Irritability   Psychosis:  None   Duration of Psychotic symptoms: No data recorded  Trauma:  None   Obsessions:  N/A   Compulsions:  None   Inattention:  No data recorded  Hyperactivity/Impulsivity:  N/A   Oppositional/Defiant Behaviors:  None   Emotional Irregularity:  Chronic feelings of  emptiness   Other Mood/Personality Symptoms:  No data recorded   Mental Status Exam Appearance and self-Kline  Stature:  Average   Weight:  Average weight   Clothing:  Neat/clean   Grooming:  Normal   Cosmetic use:  None   Posture/gait:  Normal   Motor activity:  Not Remarkable   Sensorium  Attention:  Normal   Concentration:  Normal   Orientation:  X5   Recall/memory:  Normal   Affect and Mood  Affect:  Appropriate   Mood:  Depressed   Relating  Eye contact:  Normal   Facial expression:  Depressed   Attitude toward examiner:  Cooperative   Thought and Language  Speech flow: Clear and Coherent   Thought content:  Appropriate to Mood and Circumstances   Preoccupation:  Guilt   Hallucinations:  None   Organization:  No data recorded  [employer] of Knowledge:  Average   Intelligence:  Average    Abstraction:  Normal   Judgement:  Impaired   Reality Testing:  Unaware   Insight:  Lacking; Poor   Decision Making:  Impulsive   Social Functioning  Social Maturity:  Self-centered   Social Judgement:  Normal   Stress  Stressors:  Relationship (Seperation from spouse last Monday)   Coping Ability:  Normal   Skill Deficits:  Activities of daily living   Supports:  Other (Comment) ("I have no supports")     Religion: Religion/Spirituality Are You A Religious Person?: No  Leisure/Recreation: Leisure / Recreation Do You Have Hobbies?: No  Exercise/Diet: Exercise/Diet Do You Exercise?: No Have You Gained or Lost A Significant Amount of Weight in the Past Six Months?: No Do You Follow a Special Diet?: No Do You Have Any Trouble Sleeping?: No   CCA Employment/Education Employment/Work Situation: Employment / Work Situation Employment situation: Employed 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, recovering from recent cardiac illness How long has patient been employed?: several months 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, states she was anxious to leave work and get home to drink, since then has completed various tx programs and AA, 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 What is the longest time patient has a held a job?: years Where was the patient employed at that time?: [employer] Has patient ever been in the [employer]?: No  Education: Education Is Patient Currently Attending School?: No Did Teacher, adult education From [employer]?: Yes Did Physicist, medical?: Yes What Type of College Degree Do you Have?: Nursing Did You Attend Graduate School?:  (unk) Did You Have An Individualized Education Program (IIEP): No Did You Have Any  Difficulty At School?: No Patient's Education Has Been Impacted by Current Illness: No   CCA Family/Childhood History Family and Relationship History: Family history Marital status: Separated Number of Years Married:  (31 yrs) Separated, when?: Seperated last Monday What types of issues is patient dealing with in the relationship?: relationship has improved since pt entered treatment and addressed substance use issues, husband now supportive Are you sexually active?: No Does patient have children?: Yes How many children?: 2 How is patient's relationship with their children?: good w daughter (8) and son (55)  Childhood History:  Childhood History By whom was/is the patient raised?: Both parents  Additional childhood history information: adopted as infant Description of patient's relationship with caregiver when they were a child: good childhood, no issues Does patient have siblings?: No Did patient suffer any verbal/emotional/physical/sexual abuse as a child?: No Did patient suffer from severe childhood neglect?: No Has patient ever been sexually abused/assaulted/raped as an adolescent or adult?: No Was the patient ever a victim of a crime or a disaster?: No Witnessed domestic violence?: No Has patient been affected by domestic violence as an adult?: No  Child/Adolescent Assessment:     CCA Substance Use Alcohol/Drug Use: Alcohol / Drug Use Pain Medications: SEE MAR Prescriptions: SEE MAR Over the Counter: SEE MAR Longest period of sobriety (when/how long): 6 months Negative Consequences of Use: [place] / School (DUI obtained in 2020) Withdrawal Symptoms: Agitation,Delirium Substance #1 Name of Substance 1: Alcohol 1 - Age of First Use: teens 1 - Amount (size/oz): She drinks #2 [facility]), every night 1 - Frequency: daily 1 - Duration: She has been drinking daily since November 2021. 1 - Last Use / Amount: 01/04/2021                        ASAM's:  Six Dimensions of Multidimensional Assessment  Dimension 1:  Acute Intoxication and/or Withdrawal Potential:      Dimension 2:  Biomedical Conditions and Complications:      Dimension 3:  Emotional, Behavioral, or Cognitive Conditions and Complications:     Dimension 4:  Readiness to Change:     Dimension 5:  Relapse, Continued use, or Continued Problem Potential:     Dimension 6:  Recovery/Living Environment:     ASAM Severity Score:    ASAM Recommended Level of Treatment:     Substance use Disorder (SUD)    Recommendations for Services/Supports/Treatments:    DSM5 Diagnoses: Patient Active Problem List   Diagnosis Date Noted  . Insomnia 06/18/2020  . SUI (stress urinary incontinence, female)   . Hypertension   . History of anemia   . GERD (gastroesophageal reflux disease)   . History of alcoholism ([place]) 02/25/2020  . Intractable migraine with aura without status migrainosus 02/25/2020  . Attention deficit hyperactivity disorder (ADHD), predominantly inattentive type 01/07/2020  . GAD (generalized anxiety disorder) 01/07/2020  . Diverticulitis with abscess/fistula s/p robotic left colectomy 06/16/2020 09/06/2019  . MDD (major depressive disorder) 10/07/2015    Patient Centered Plan: Patient is on the following Treatment Plan(s):  Anxiety, Depression and Substance Abuse   Referrals to Alternative Service(s): Referred to Alternative Service(s):   Place:   Date:   Time:    Referred to Alternative Service(s):   Place:   Date:   Time:    Referred to Alternative Service(s):   Place:   Date:   Time:    Referred to Alternative Service(s):   Place:   Date:   Time:     Waldon Merl, CounselorComprehensive Clinical Assessment (CCA) Screening, Triage and Referral Note   [name] is a 55 y.o. female with a history of alcohol use disorder, ADHD, anxiety, and major depressive disorder. She presents to  [facility] Emergency Department by EMS from her  home. EDP notes that patient's chief complaint is ingestion. Upon chart review: "The patient reports that her daughter called 911 tonight.  States that she also called last night.  She reports that she drank 1 alcoholic Bahama mama beverage earlier tonight.  She also alleges that she consumed 6 to 8 ounces of Pine-Sol, 5  to 6 tablets of her home 25 mg tablets of hydroxyzine, and 3 tablets of her home 0.1 mg clonidine.  Per EMS, ingestion reportedly occurred around 19:30".   When asked what brought her to the Emergency Department she states, "I had no other place to go". When asked how did EMS become involved. She states, "I don't know who called 911/EMS".  She explains:  "The police just showed up at my door and told me I had to go to the Emergency Department...Marland KitchenMarland KitchenThey were afraid I would hurt myself". Patient later reports that her daughter was probably the person to call 911/EMS. States that her daughter has been worried about her wellbeing.   Patient reports leaving her home that shares with her spouse, last Monday. She has since been staying in a hotel room. Patient and spouse decided to separate last week. States that she was hoping to have some time to herself because the separation is difficult for her to handle.    Patient denies current suicidal ideations. She denies the allegations of overdosing. States that her daughter saw the medications in her hotel room, yesterday. She believes that her daughter assumed that she was going to overdose. Patient adamantly denies that she wanted to hurt herself and/or tried to hurt herself. States, "I had my fucking blood pressure medications on the dresser, next to the bed, so why jump to the conclusion I'm trying to commit suicide".She reports #1 prior suicide attempt, "yrs ago". States that she doesn't remember how she tried to hurt herself. However, recalls the trigger for her past suicide was anxiety. No history of self-mutilating behaviors.   Most significant  stressor outside of her separation is loosing her job. States that she didn't show up to her job last night because she was made to come in the hospital.  Current depressive symptoms include: angry/irritability, isolating self from others, hopelessness, worthlessness, fatigue, and insomnia. She sleeps anytime she is not at working. However, x2 days she has not slept at all. He appetite is poor. She doesn't recall the last time that she has eaten. No significant weight loss reported. Patient reports on-going issues with severe anxiety and panic attacks. She doesn't know if she has a family history of mental health illness. States that she was adopted. Denies a history of trauma and/or abuse.   Denies homicidal ideations. Denies history of aggressive and/or assaultive behaviors. No criminal charges pending and/or court dates. However, reports obtaining a DUI in 2020.  Denies AVH's.  She denies drug use. However, does report a significant history of alcohol use to help her cope with stress/anxiety. She started drinking at the age of 55 yrs old . She drinks #2 [facility]), every night before bed. She stopped drinking for several months but restarted back,  November 2021. States, "I am an alcoholic and I've had a problem for years". She participated in substance abuse residential program [employer]) in 2013 and 2014. She stayed in the program for 90 days on each occasion and then transferred to a [facility] after her 90 days of treatment. Longest period of sobriety is 6 months. Denies a history of seizures but has a history of DT's. No current withdrawal symptoms reported.   Her current outpatient psychiatrist is Dr. Toy Kline. She is compliant with psychiatric medications. She does not participate in outpatient therapy. She has a history of inpatient psychiatric treatment at [facility] (2014 & 2015). Upon chart review patient was in the [facility] Emergency Departments on  11/26/2012  for Anxiety,  03/28/2013 for Depression/Alcohol Intoxication, 10/06/2015 for Suicidal ideations/Alcohol Abuse, 12/17/2015 Alcohol Intoxication.   Disposition: Per Mary Sine, NP, patient meets criteria for INPT treatment. LCSW to seek appropriate placement.   01/05/2021 [name] [id number]  Chief Complaint:  Chief Complaint  Patient presents with  . Psychiatric Evaluation   Visit Diagnosis: Major Depressive Disorder, Recurrent, Severe, without psychotic features, Anxiety Disorder, Alcohol Use Disorder-Severe  Patient Reported Information How did you hear about [place]? No data recorded  Referral name: EMS/911   Referral phone number: No data recorded Whom do you see for routine medical problems? -- (unk)   Practice/Facility Name: No data recorded  Practice/Facility Phone Number: No data recorded  Name of Contact: No data recorded  Contact Number: No data recorded  Contact Fax Number: No data recorded  Prescriber Name: No data recorded  Prescriber Address (if known): No data recorded What Is the Reason for Your Visit/Call Today? No data recorded How Long Has This Been Causing You Problems? > than 6 months  Have You Recently Been in Any Inpatient Treatment (Hospital/Detox/Crisis Center/28-Day Program)? No   Name/Location of Program/Hospital:No data recorded  How Long Were You There? No data recorded  When Were You Discharged? No data recorded Have You Ever Received Services From [facility] Before? Yes   Who Do You See at [facility]? She has a history of inpatient psychiatric treatment at [facility] (2014 & 2015). Upon chart review patient was in the [facility] Emergency Departments on 11/26/2012 for Anxiety, 03/28/2013 for Depression/Alcohol Intoxication, 10/06/2015 for Suicidal ideations/Alcohol Abuse, 12/17/2015 Alcohol Intoxication/Delirum.  Have You Recently Had Any Thoughts About Hurting Yourself? No   Are You Planning to Commit Suicide/Harm Yourself At This time?  No  Have you Recently Had  Thoughts About [place]? No   Explanation: No data recorded Have You Used Any Alcohol or Drugs in the Past 24 Hours? No   How Long Ago Did You Use Drugs or Alcohol?  No data recorded  What Did You Use and How Much? No data recorded What Do You Feel Would Help You the Most Today? Assessment Only  Do You Currently Have a Therapist/Psychiatrist? Yes   Name of Therapist/Psychiatrist: Dr. Kerin Salen   Have You Been Recently Discharged From Any Office Practice or Programs? No   Explanation of Discharge From Practice/Program:  No data recorded    [facility] Screening Triage Referral Assessment Type of Contact: Tele-Assessment   Is this Initial or Reassessment? Initial Assessment   Date Telepsych consult ordered in [facility]:  01/05/2021   Time Telepsych consult ordered in [facility]:  No data recorded Patient Reported Information Reviewed? Yes   Patient Left Without Being Seen? No data recorded  Reason for Not Completing Assessment: No data recorded Collateral Involvement: Spouse  Does Patient Have a Court Appointed Legal Guardian? No data recorded  Name and Contact of Legal Guardian:  No data recorded If Minor and Not Living with Parent(s), Who has Custody? Spouse Elibeth Castillejo [phone].  Is CPS involved or ever been involved? Never  Is APS involved or ever been involved? Never  Patient Determined To Be At Risk for Harm To Self or Others Based on Review of Patient Reported Information or Presenting Complaint? No   Method: No data recorded  Availability of Means: No data recorded  Intent: No data recorded  Notification Required: No data recorded  Additional Information for Danger to Others Potential:  No data recorded  Additional Comments for Danger to Others  Potential:  No data recorded  Are There Guns or Other Weapons in [place]?  No data recorded   Types of Guns/Weapons: No data recorded   Are These Weapons Safely Secured?                              No data  recorded   Who Could Verify You Are Able To Have These Secured:    No data recorded Do You Have any Outstanding Charges, Pending Court Dates, Parole/Probation? No data recorded Contacted To Inform of Risk of Harm To Self or Others: No data recorded Location of Assessment: [facility] ED  Does Patient Present under Involuntary Commitment? No   IVC Papers Initial File Date: No data recorded  [place] of Residence: [place]  Patient Currently Receiving the Following Services: Medication Management   Determination of Need: Emergent (2 hours)   Options For Referral: Outpatient Therapy; Medication Management; Chemical Dependency Intensive Outpatient Therapy (CDIOP); Inpatient Hospitalization   Waldon Merl, Counselor

## 2021-01-05 NOTE — ED Notes (Signed)
Pt just moved to purple zone and medications pending given by this RN. Pt resting on bed, bilateral rails up, sitter at the bedside.

## 2021-01-05 NOTE — Progress Notes (Signed)
Pt accepted to  [facility], [address] [facility], BED 304-1   [name], NP is the accepting provider.    Dr. Mallie Darting is the attending provider.    Call report to  [facility] ED notified.     Pt is  Voluntary.    Pt may be transported by [facility], [phone]  Pt scheduled  to arrive at [facility], BED 304-1 after 2000 hours   Before arrival patient must sign consent forms.  [place] Disposition [phone] (cell)

## 2021-01-05 NOTE — BH Assessment (Addendum)
Clinician requested TTS machine to be placed in patient's room for her initial assessment. Per, nursing patient is not in a room. Nursing will have to find a room to place patient in so that her TTS assessment can be completed.

## 2021-01-05 NOTE — ED Notes (Signed)
Breakfast Ordered 

## 2021-01-05 NOTE — ED Notes (Signed)
Pt becoming aggressive from no where throwing stuff to other staff wanting to leave. Pt oriented that she will be transferred after 20:00 to [facility] and we are just waiting for this, pt continues to yell and throwing stuff so security called.

## 2021-01-05 NOTE — BH Assessment (Signed)
Nursing notified this clinician that the tele assessment machine is set up and patient is ready to be seen.

## 2021-01-05 NOTE — ED Notes (Signed)
Pt's belongings on locker 2 and some belongings with security.

## 2021-01-05 NOTE — ED Notes (Signed)
Pt requesting a breathing treatment. This RN informed pt that I would have to contact the doctor and that I was not exactly sure how we would make that happen at the moment because pt is currently in a hallway bed. Pt then got upset and stated "Well I guess if I stop breathing you'll do something about it then, huh? Pt then demanded the RN go get her an inhaler. Will contact MD and continue to monitor pt.

## 2021-01-06 ENCOUNTER — Encounter (HOSPITAL_COMMUNITY): Payer: Self-pay | Admitting: Psychiatric/Mental Health

## 2021-01-06 DIAGNOSIS — Z789 Other specified health status: Secondary | ICD-10-CM

## 2021-01-06 DIAGNOSIS — F32A Depression, unspecified: Secondary | ICD-10-CM

## 2021-01-06 DIAGNOSIS — Z7289 Other problems related to lifestyle: Secondary | ICD-10-CM

## 2021-01-06 DIAGNOSIS — F1090 Alcohol use, unspecified, uncomplicated: Secondary | ICD-10-CM

## 2021-01-06 LAB — LIPID PANEL
Cholesterol: 157 mg/dL (ref 0–200)
HDL: 60 mg/dL (ref >40)
LDL Cholesterol: 82 mg/dL (ref 0–99)
Total CHOL/HDL Ratio: 2.6 RATIO
Triglycerides: 76 mg/dL (ref <150)
VLDL: 15 mg/dL (ref 0–40)

## 2021-01-06 LAB — TSH: TSH: 0.472 u[IU]/mL (ref 0.350–4.500)

## 2021-01-06 MED ORDER — SUMATRIPTAN SUCCINATE 50 MG PO TABS
100.0000 mg | ORAL_TABLET | ORAL | Status: DC | PRN
  Administered 2021-01-06 – 2021-01-10 (×2): 100 mg via ORAL
  Filled 2021-01-06 (×2): qty 2

## 2021-01-06 MED ORDER — GABAPENTIN 300 MG PO CAPS
900.0000 mg | ORAL_CAPSULE | Freq: Every day | ORAL | Status: DC
  Administered 2021-01-06 – 2021-01-10 (×4): 900 mg via ORAL
  Filled 2021-01-06 (×8): qty 3

## 2021-01-06 MED ORDER — ENSURE ENLIVE PO LIQD
237.0000 mL | Freq: Two times a day (BID) | ORAL | Status: DC
  Administered 2021-01-06 – 2021-01-11 (×8): 237 mL via ORAL
  Filled 2021-01-06 (×15): qty 237

## 2021-01-06 MED ORDER — ALBUTEROL SULFATE HFA 108 (90 BASE) MCG/ACT IN AERS
1.0000 | INHALATION_SPRAY | Freq: Four times a day (QID) | RESPIRATORY_TRACT | Status: DC | PRN
  Administered 2021-01-06 – 2021-01-11 (×4): 2 via RESPIRATORY_TRACT

## 2021-01-06 MED ORDER — SUMATRIPTAN SUCCINATE 50 MG PO TABS: 100.0000 mg | ORAL_TABLET | ORAL | Status: DC | PRN

## 2021-01-06 MED ORDER — GABAPENTIN 300 MG PO CAPS
300.0000 mg | ORAL_CAPSULE | Freq: Every day | ORAL | Status: DC
  Filled 2021-01-06: qty 1

## 2021-01-06 MED ORDER — GABAPENTIN 100 MG PO CAPS
100.0000 mg | ORAL_CAPSULE | Freq: Three times a day (TID) | ORAL | Status: DC
  Filled 2021-01-06 (×2): qty 1

## 2021-01-06 MED ORDER — GABAPENTIN 300 MG PO CAPS
300.0000 mg | ORAL_CAPSULE | Freq: Three times a day (TID) | ORAL | Status: DC
  Administered 2021-01-06 – 2021-01-11 (×13): 300 mg via ORAL
  Filled 2021-01-06 (×20): qty 1

## 2021-01-06 MED ORDER — ALBUTEROL SULFATE HFA 108 (90 BASE) MCG/ACT IN AERS
INHALATION_SPRAY | RESPIRATORY_TRACT | Status: AC
  Filled 2021-01-06: qty 6.7

## 2021-01-06 NOTE — BHH Counselor (Signed)
CSW was informed that the patient wanted the number to [facility].  CSW provided the patient with the number to [facility] and with additional housing, food, insurance, and family justice resources.  The patient asked CSW if husband would allow her to come back home.  CSW informed patient that her husband stated that she can come back home but that he does not want to live there with her as a family unit any longer.  Patient stated ok and agreed to talk to a nurse, Tech, or social worker if she felt anxious or depressed.  CSW will continue to talk with the patient about further discharge planning.

## 2021-01-06 NOTE — Progress Notes (Signed)
D: Patient presents with anxious affect. Patient is guarded upon interaction but compliant with medications at this time. Patient denies SI/HI at this time. Patient also denies AH/VH at this time. Patient contracts for safety.  A: Provided positive reinforcement and encouragement.  R: Patient cooperative and receptive to efforts. Patient remains safe on the unit.   01/06/21 2113  Psych Admission Type (Psych Patients Only)  Admission Status Involuntary  Psychosocial Assessment  Patient Complaints Agitation;Anxiety;Depression  Eye Contact Avoids  Facial Expression Anxious  Affect Anxious  Speech Logical/coherent;Argumentative  Interaction Assertive  Motor Activity Slow  Appearance/Hygiene Excess accessories  Behavior Characteristics Anxious  Mood Anxious;Irritable  Thought Process  Coherency WDL  Content WDL  Delusions None reported or observed  Perception WDL  Hallucination None reported or observed  Judgment Impaired  Confusion None  Danger to Self  Current suicidal ideation? Denies  Danger to Others  Danger to Others None reported or observed

## 2021-01-06 NOTE — Progress Notes (Signed)
Psychoeducational Group Note  Date:  01/06/2021 Time:  2015  Group Topic/Focus:  wrap up group  Participation Level: Did Not Attend  Participation Quality:  Not Applicable  Affect:  Not Applicable  Cognitive:  Not Applicable  Insight:  Not Applicable  Engagement in Group: Not Applicable  Additional Comments: Pt remained in bed during group time.  Winfield Rast S 01/06/2021, 8:29 PM

## 2021-01-06 NOTE — Tx Team (Signed)
Initial Treatment Plan 01/06/2021 2:45 AM [name] [name] EEF:[id number]    PATIENT STRESSORS: Financial difficulties Marital or family conflict Occupational concerns   PATIENT STRENGTHS: Physical Health   PATIENT IDENTIFIED PROBLEMS: Self injurious behavior  Anxiety  (Patient did not identify goals for treatment during assessment)                 DISCHARGE CRITERIA:  Improved stabilization in mood, thinking, and/or behavior Verbal commitment to aftercare and medication compliance  PRELIMINARY DISCHARGE PLAN: Outpatient therapy Return to previous work or school arrangements  PATIENT/FAMILY INVOLVEMENT: This treatment plan has been presented to and reviewed with the patient, [name], and/or family member.  The patient and family have been given the opportunity to ask questions and make suggestions.  Arvid Right, RN 01/06/2021, 2:45 AM

## 2021-01-06 NOTE — Progress Notes (Signed)
Pt was irritable for most of the day and stated that she was sad because "she had no where to live" because of conflict with husband and husband wanting to terminate relationship with pt.    Pt said late this afternoon that she talked to her husband and husband says pt can return home.  Pt is hopeful that she and husband can work out their issues.  Pt then says, "Maybe I should move somewhere far away so I can start a life for myself."   RN will continue to assess for concerns and provide support as needed.

## 2021-01-06 NOTE — BHH Suicide Risk Assessment (Addendum)
[facility] Admission Suicide Risk Assessment   Demographic factors:  Caucasian, living alone Current Mental Status: Alleged h/o suicide attempt prior to admission Loss Factors:  Loss of 2 recent sitter jobs, separation from husband Historical Factors:  Impulsivity, ongoing alcohol use, previous psychiatric admissions and diagnoses Risk Reduction Factors:  Employed, established with outpatient mental health services  Total Time Spent in Direct Patient Care:  I personally spent 60 minutes on the unit in direct patient care. The direct patient care time included face-to-face time with the patient, reviewing the patient's chart, communicating with other professionals, and coordinating care. Greater than 50% of this time was spent in counseling or coordinating care with the patient regarding goals of hospitalization, psycho-education, and discharge planning needs.  Principal Problem: Depression, unspecified Diagnosis:  Principal Problem:   Depression, unspecified Active Problems:   Alcohol use  Subjective Data: Mrs. Vanatta is a 55 yr old female with a h/o alcohol use d/o, ADHD, anxiety, and MDD who per ED notes was taken by EMS from a hotel to the ED for a reported ingestion. Per the patient, she had recently separated from her husband and was staying at a local hotel. She admits she made passing suicidal statements to her daughter and a friend and these individuals then called the police to do a safety check on her. She states she "was pissed" and when the police questioned her about SI she told them she had "taken pills and ingested pine sol."  Per the ED notes, she reported on arrival that she had ingested 6 to 8 ounces of Pine-Sol, 5 to 6 tablets of her home 25 mg tablets of hydroxyzine, and 3 tablets of her home 0.1 mg clonidine. When questioned today about this ingestion, she states that she did not actually overdose on anything and made the statement out of frustration and in the context of drinking  alcohol earlier that evening.   The patient states she has been missing some of her Prozac doses while staying at the hotel but was previously stable on Prozac 60mg  daily for management of her depression. She additionally takes Neurontin 300mg  bid and 900mg  qhs for anxiety and Vistaril PRN for anxiety as written by Dr. Toy Care, her outpatient psychiatrist. She denied previous suicide attempts or previous psychiatric admissions but review of her records shows she was admitted to [facility] in 2016 for MDD vs alcohol induced mood d/o and 2017 for MDD with SI. She denies current SI or HI and denies AVH or h/o previous psychosis. She reports that leading up to this admission she has not felt depressed, had been going to work, and had been sleeping and eating well. She reports poor focus associated with ADHD. She states she previously was treated for bipolar symptoms with a brief trial of Lithium and Latuda but stopped the medications due to emotional blunting and weight gain in the past. When questioned about previous manic or hypomanic symptoms, she denies any. She specifically denies periods of decreased need for sleep, increased energy, racing thoughts, risk taking, hyperverbal speech, or increased goal directed behaviors in the past. She thinks she was previously mis-diagnosed as bipolar in the context of her alcohol use and anxiety issues. She states she was in detox and then rehab at [facility] treatment center in 2013 for 28 days and again for 90 days at [facility] treatment center in 2014. She has "a blow and go" on her car after a DUI in 2020 but currently does not believe she has an issue with  alcohol. 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 She denies other illicit drug use.    Continued Clinical Symptoms:  Alcohol Use Disorder Identification Test Final Score  (AUDIT): 4 The "Alcohol Use Disorders Identification Test", Guidelines for Use in Primary Care, Second Edition.  World Pharmacologist [facility]). Score between 0-7:  no or low risk or alcohol related problems. Score between 8-15:  moderate risk of alcohol related problems. Score between 16-19:  high risk of alcohol related problems. Score 20 or above:  warrants further diagnostic evaluation for alcohol dependence and treatment.  CLINICAL FACTORS:   More than one psychiatric diagnosis Previous Psychiatric Diagnoses and Treatments Alcohol use  Psychiatric Specialty Exam: Physical Exam Vitals reviewed.  HENT:     Head: Normocephalic.  Pulmonary:     Effort: Pulmonary effort is normal.  Neurological:     Mental Status: She is alert.     ROS: positive for chest tightness associated with COPD and HA  Blood pressure (!) 110/57, pulse 69, temperature 98.3 F (36.8 C), temperature source Oral, resp. rate 18, height 5\' 5"  (1.651 m), weight 72.1 kg, SpO2 99 %.Body mass index is 26.46 kg/m.  General Appearance: disheveled appearing, resting in bed, appears stated age  Eye Contact:  Fair  Speech:  Clear and Coherent and Normal Rate  Volume:  decreased  Mood:  Irritable  Affect:  Constricted  Thought Process:  Superficially goal directed but circumstantial  Orientation:  Full (Time, Place, and Person)  Thought Content:  Denies AVH; no paranoia or delusions on exam; denies SI or HI  Suicidal Thoughts:  Denied SI, intent or plan today   Homicidal Thoughts:  Denied  Memory:  Immediate;   Fair  Judgement:  Poor   Insight:  Lacking  Psychomotor Activity:  Normal  Concentration:  Concentration: Fair and Attention Span: Fair  Recall:  [employer] of Knowledge:  Fair  Language:  Fair  Akathisia:  Negative  Assets:  Desire for Improvement Resilience, employment  ADL's:  Impaired  Cognition:  WNL  Sleep:  Number of Hours: 5.75   COGNITIVE FEATURES THAT CONTRIBUTE TO RISK:   Minimizing  SUICIDE RISK:   Moderate:  Frequent suicidal ideation with limited intensity, and duration, some specificity in terms of plans, no associated intent, good self-control, limited dysphoria/symptomatology, some risk factors present, and identifiable protective factors, including available and accessible social support.  PLAN OF CARE: Patient will be converted to voluntary. She is minimizing her drinking in the context of her past alcohol use d/o and previous DUI but is not interested in residential rehab or SAIOP on discussion today. She is willing to consider [facility] as an option after discharge as an alternative. She wishes to be restarted on her home Prozac 60mg , and since she reports she has only missed a few doses, we will restart at previous home dose.  She will be restarted on home Neurontin for management of cravings and anxiety. House Officer will verify and restart other home medications. She will be placed on CIWA protocol with oral thiamine and MVI for monitoring for alcohol withdrawal. Admission labs were reviewed: Tylenol <10, CMP WNL except for Calcium 8.5, Albumin 3.3, Total protein 6.2, total bili 0.2; WBC 5.1, H/H 12.4/39.4, platelets 443; Salicylate <1.5, pregnancy test negative; alcohol 168; EKG NSR QTc 438.  Will check TSH and Free T4 - per patient she is not taking recently prescribed Cytomel.   DX:  Unspecified depressive d/o (r/o MDD by hx; r/o alcohol induced depressive d/o) ADHD by hx Alcohol use - episodic with h/o previous alcohol use d/o Unspecified anxiety d/o  I certify that inpatient services furnished can reasonably be expected to improve the patient's condition.   Harlow Asa, MD, FAPA 01/06/2021, 1:45 PM

## 2021-01-06 NOTE — BHH Counselor (Signed)
Adult Comprehensive Assessment  Patient ID: [name], female   DOB: [birth year], 55 y.o.   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 Family Relationships: Pt reports conflict with husband and Geophysicist/field seismologist / Lack of resources (include bankruptcy): Pt reports some financial stressors  Housing / Lack of housing: Pt reports living with husband and children   Physical health (include injuries & life threatening diseases): Pt reports no stressors  Social relationships: Pt reports few social stressors  Substance abuse: Pt reports drinking alcohol several times a week Bereavement / Loss: Pt reports no stressors   Living/Environment/Situation:  Living Arrangements: Spouse/significant other Living conditions (as described by patient or guardian): "I like it but I dont know if I can go back there now"  How long has patient lived in current situation?: 15 years  What is atmosphere in current home: Comfortable, Quarry manager, Supportive  Family History:  Marital status: Married Number of Years Married: 60  What types of issues is patient dealing with in the relationship?: "He is controlling and we fight a lot"  Does patient have children?: Yes How many children?: 2 How is patient's relationship with their children?: "It is ok but my daughter is the one who put me here" Daughter (36) and son (26)  Childhood History:  By whom was/is the patient raised?: Both parents Additional childhood history information: adopted as infant Description of patient's relationship with caregiver when they were a child: good childhood, no issues Patient's description of current relationship with people who raised him/her: visits frequently, supportive Does patient have siblings?: Yes Number of Siblings:  2 Description of patient's current relationship with siblings: good relationship w 2 brothers Did patient suffer any verbal/emotional/physical/sexual abuse as a child?: No Did patient suffer from severe childhood neglect?: No Has patient ever been sexually abused/assaulted/raped as an adolescent or adult?: No Was the patient ever a victim of a crime or a disaster?: No Witnessed domestic violence?: No Has patient been effected by domestic violence as an adult?: No  Education:  Highest grade of school patient has completed: nursing school Currently a student?: No Learning disability?: No  Employment/Work Situation:  Employment situation: Employed  Where is patient employed: [employer] How long has patient been employed: 7 years Patient's job has been impacted by current illness: Yes Describe how patient's job has been impacted: Pt reports not being able to work because she is in the hospital.  What is the longest time patient has a held a job?: 5 years Where was the patient employed at that time?: [employer] patient ever been in the [employer]?: No Has patient ever served in Recruitment consultant?: No  Financial Resources:  Museum/gallery curator resources: Employment, Income from spouse, Multimedia programmer  Does patient have a Programmer, applications or guardian?: No  Alcohol/Substance Abuse:  What has been your use of drugs/alcohol within the last 12 months?: Pt reports drinking alcohol several times a week with unspecified amounts.  If attempted suicide, did drugs/alcohol play a role in this?: Yes (feels suicidal when drinking, shame/guilt re relapse) Alcohol/Substance Abuse Treatment Hx: Attends AA/NA, Past Tx, [facility] admission Nov 2016 for similar issues. [facility] and private therapist. If yes, describe treatment: 2 admissions for rehab at [facility], active in [place] w sponsor, lived in [place] immediately after discharge from  [place] Has alcohol/substance abuse  ever caused legal problems?: Yes, recent DUI, suspended drivers license   Social Support System:  Patient's Community Support System: None  Describe Community Support System: "I dont have one"  Type of faith/religion: Christian, Methodist How does patient's faith help to cope with current illness?: Social worker and prayer  Leisure/Recreation:  Leisure and Hobbies: Architectural technologist, crafts  Strengths/Needs:  What things does the patient do well?: "I am always there to help people" In what areas does patient struggle / problems for patient: Drinking   Discharge Plan:  Does patient have access to transportation?: Yes Will patient be returning to same living situation after discharge?: Unsure at this time Currently receiving community mental health services: No If no, would patient like referral for services when discharged?: Yes (Pt reports cannot return to previous therapist, Sharmon Revere)  Does patient have financial barriers related to discharge medications?: No   Summary/Recommendations:   Summary and Recommendations (to be completed by the evaluator): [name] is a 55 year old, Caucasian, female who was admitted to the hospital due to worsening depression and an overdose on 8oz of Pine-Sol, Hydroxyzine, and Clonidine.  The Pt reports that she was living with her husband and children but is not sure if she can go back to her home.  The Pt reports that she currently has no license due to a DUI and has a machine she must blow in before her car will crank.  The Pt reports that she drinks alcohol several times a week abut did not disclose the amount.  The Pt reports that there is conflict between her and her daughter due to her use of alcohol.  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  The Pt reports that she stopped going to outpatient treatment due to Covid restrictions and her work schedule.  While in the hospital the Pt can benefit from crisis  stabilization, medication evaluation, group therapy, psycho-education, case management, and discharge planning. Upon discharge the Pt would like to return home but if the Pt cannot return home a packet of shelter resources will be provided to the Pt. The Pt will also follow up with a local mental health agency for therapy and medication management.   [name]. 01/06/2021

## 2021-01-06 NOTE — [facility] Suicide Risk Assessment (Signed)
[place] INPATIENT:  Family/Significant Other Suicide Prevention Education  Suicide Prevention Education:  Education Completed; Natesha Hassey [phone] (Husband) has been identified by the patient as the family member/significant other with whom the patient will be residing, and identified as the person(s) who will aid the patient in the event of a mental health crisis (suicidal ideations/suicide attempt).  With written consent from the patient, the family member/significant other has been provided the following suicide prevention education, prior to the and/or following the discharge of the patient.  The suicide prevention education provided includes the following:  Suicide risk factors  Suicide prevention and interventions  National Suicide Hotline telephone number  [facility] assessment telephone number  [facility] Emergency Assistance [place] and/or Residential Mobile Crisis Unit telephone number  Request made of family/significant other to:  Remove weapons (e.g., guns, rifles, knives), all items previously/currently identified as safety concern.    Remove drugs/medications (over-the-counter, prescriptions, illicit drugs), all items previously/currently identified as a safety concern.  The family member/significant other verbalizes understanding of the suicide prevention education information provided.  The family member/significant other agrees to remove the items of safety concern listed above.  Mr. Viernes states that his wife walked out of the home 2 Monday's ago and has not returned because he told her that since she had begun drinking again he would not be a part of it.  Mr. Detjen states that his wife has been an "alcoholic" for the past 20 years and he does not wish to be a part of it anymore.  Mr. Guise states that his wife was staying in a hotel and that she can come back to the home but he will move out if she does.  Mr. Nierman states that his wife got  clean a few years ago and the relapsed in October of 2021.  Mr. Cwikla states "I believe she is the type of person that wants to be miserable".  Mr. Davern states that his wife has been on several anti-depressants and always takes more then prescribed in an attempt to get high.  Mr. Selmer states that he retrieved his wife's belongings from the hotel and placed them in her car and parked the car at her mother's home.  Mr. Moroney states that he also let her employer know that she was in the hospital so she will not lose her job.  Mr. Oberry states that he would like his wife to go to a treatment center to be healthier for their children.  Mr. Hinsch states that his wife does not have any firearms or weapons in her possession. CSW completed SPE with Mr. Tarkowski.   Frutoso Chase Benino Korinek 01/06/2021, 2:36 PM

## 2021-01-06 NOTE — Progress Notes (Signed)
Nutrition Brief Note RD working remotely.  Patient identified on the Malnutrition Screening Tool (MST) Report  Wt Readings from Last 15 Encounters:  01/05/21 72.1 kg  06/18/20 69 kg  06/08/20 67.6 kg  04/27/20 65.8 kg  04/03/20 67.1 kg  09/24/19 72.4 kg  09/06/19 72.6 kg  12/25/18 78.5 kg  11/23/16 75.5 kg  01/04/16 72.6 kg  10/06/15 69.4 kg  04/28/14 56.2 kg  02/13/13 66.2 kg    Body mass index is 26.46 kg/m. Patient meets criteria for overweight status based on current BMI.   She was admitted for SI.   Current diet order is Regular and she is eating as desired for meals and snacks at this time.   Labs and medications reviewed.  Ensure Enlive was ordered BID per [facility] protocol at the time of admission. Each supplement provides 350 kcal and 20 grams protein.  No additional nutrition interventions warranted at this time. If nutrition issues arise, please consult RD.      Jarome Matin, MS, RD, LDN, CNSC Inpatient Clinical Dietitian RD pager # available in [place]  After hours/weekend pager # available in [facility]

## 2021-01-06 NOTE — H&P (Signed)
Psychiatric Admission Assessment Adult  Patient Identification: [name] MRN:  [id number] Date of Evaluation:  01/06/2021 Chief Complaint:  Substance induced mood disorder ([place]) [F19.94] Principal Diagnosis: Depression, unspecified Diagnosis:  Principal Problem:   Depression, unspecified Active Problems:   Substance induced mood disorder ([place])  History of Present Illness:  [name] is a 55 yr old female who presents with reported suicide attempt by ingestion ([place], 6 to 8 ounces of Pine-Sol, 5-6 tablets 25 mg Hydroxyzine, and 3 tablets of 0.1 mg clonidine) which patient denies. PPHx is significant for MDD, Recurrent, Severe, Anxiety, EtOH use Disorder, 2 prior admissions to [facility] [id number]), 2 previous stays at [facility] [phone]), and 1 previous suicide attempt.   She was very tired and had to be awoken to interview. When she was talking she slurred her words and would be slow to answer.   She reports that last Monday she separated from her husband and has been living in a hotel since then. She reports that  2 nights before being brought to the hospital the police came to her hotel room to do a well fare check after they were called by her daughter. The following night she was back at her house when her friend again called the police for a well fare check. She stated that the police were rude to her so she was angry and said "I might as well kill myself.". The police then brought her to the hospital because of this. She states that she was not being serious and there was no ingestion.    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 She reports that she does occasionally drink but she states its 2 drinks after work (in the morning because she works nights). But she states she does not even drink every week. (Per ED BH Assessment she reported drinking 2 Bahama Mamas every night. That she had  been drinking since age 53, stopped for a few months and then restarted November 2021.) She states she has never used any illicit drugs. She reports she used to smoke but now vapes multiple times a day. When asked she denies every being hospitalzied for psychiatric illnesses (has been at [facility] 2016 and 2017 on chart review). She reports she has been to Rehab 2 times once in 2013 (only stayed 28 days) and once in 2014 (completed the 90 days). She also denied a previous suicide attempt (chart review shows a previous attempt reported).   She denies any abuse in her past. She reports being adopted and not knowing her birth family so does not know any family medical or psychiatric history. She reports that she does not have a support network because she has attempted to call multiple people but none of them answered. She reports that she had a DUI 2 years ago and has a Research scientist (medical) on her car. She reports no access to fire arms. Today she reports no SI, HI, or AVH.   Associated Signs/Symptoms: Depression Symptoms:  Denies feelings of sadness or being down, issues with sleep or appetite, low energy, reports trouble focusing but attributes it to ADHD Duration of Depression Symptoms: Less than two weeks  (Hypo) Manic Symptoms:  Denies all Anxiety Symptoms:  Excessive Worry, Panic Symptoms, Psychotic Symptoms:  Denies all Duration of Psychotic Symptoms: No data recorded PTSD Symptoms: Denies all Total Time spent with patient: 30 minutes  Past Psychiatric  History: MDD, Recurrent, Severe, Anxiety, EtOH use Disorder, 2 prior admissions to [facility] [id number]), 2 previous stays at [facility] [phone]), and 1 previous suicide attempt.  Is the patient at risk to self? Yes.    Has the patient been a risk to self in the past 6 months? Yes.    Has the patient been a risk to self within the distant past? Yes.    Is the patient a risk to others? Yes.  due to drinking and driving  Has the patient been a risk  to others in the past 6 months? Yes.    Has the patient been a risk to others within the distant past? Yes.     Prior Inpatient Therapy:  [facility] 2016, 2017 Prior Outpatient Therapy:  Dr. Toy Care  Alcohol Screening: 1. How often do you have a drink containing alcohol?: 4 or more times a week 2. How many drinks containing alcohol do you have on a typical day when you are drinking?: 1 or 2 3. How often do you have six or more drinks on one occasion?: Never AUDIT-C Score: 4 4. How often during the last year have you found that you were not able to stop drinking once you had started?: Never 5. How often during the last year have you failed to do what was normally expected from you because of drinking?: Never 6. How often during the last year have you needed a first drink in the morning to get yourself going after a heavy drinking session?: Never 7. How often during the last year have you had a feeling of guilt of remorse after drinking?: Never 8. How often during the last year have you been unable to remember what happened the night before because you had been drinking?: Never 9. Have you or someone else been injured as a result of your drinking?: No 10. Has a relative or friend or a doctor or another health worker been concerned about your drinking or suggested you cut down?: No Alcohol Use Disorder Identification Test Final Score (AUDIT): 4 Substance Abuse History in the last 12 months:  Yes.   Consequences of Substance Abuse: Legal Consequences:  DUI, Breathalizer on car Previous Psychotropic Medications: Yes Latuda, Lithium, Adderal,  Buspar, Vistaril, Prozac, Gabapentin Psychological Evaluations: Yes  Past Medical History:  Past Medical History:  Diagnosis Date  . ADHD (attention deficit hyperactivity disorder)   . Alcohol dependence with acute alcoholic intoxication with complication ([place]) 0000000  . Anemia 1990s  . Anxiety   . Arthritis    neck  . Asthma    uses inhalers every day  .  Cancer ([place])    bladder  . Cutaneous lupus erythematosus   . 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 History of anemia   . Severe episode of recurrent major depressive disorder, without psychotic features ([place]) 01/07/2020  . Sleep apnea    mild  . SUI (stress urinary incontinence, female)     Past Surgical History:  Procedure Laterality Date  . BLADDER SUSPENSION N/A 02/13/2013   Procedure: [facility] SLING;  Surgeon: Malka So, MD;  Location: [facility];  Service: Urology;  Laterality: N/A;  . CESAREAN SECTION  12-23-2004    W/ LEFT TUBAL LIGATION AND RIGHT SALPINGECTOMY  . CESAREAN SECTION  1995  . CYSTOSCOPY WITH STENT PLACEMENT Bilateral 06/16/2020   Procedure: CYSTOSCOPY WITH FIREFLY INJECTION;  Surgeon: Lucas Mallow, MD;  Location: [facility];  Service: Urology;  Laterality: Bilateral;  .  IR RADIOLOGIST EVAL & MGMT  04/21/2020  . IR RADIOLOGIST EVAL & MGMT  05/12/2020  . ORIF HUMERUS FRACTURE Right 04/28/2014   Procedure: OPEN REDUCTION INTERNAL FIXATION (ORIF) PROXIMAL HUMERUS FRACTURE;  Surgeon: Nita Sells, MD;  Location: [facility];  Service: Orthopedics;  Laterality: Right;  . ORIF PROXIMAL HUMERUS FRACTURE Right 04/28/2014   DR CHANDLER  . PROCTOSCOPY N/A 06/16/2020   Procedure: RIGID PROCTOSCOPY;  Surgeon: Michael Boston, MD;  Location: [facility];  Service: General;  Laterality: N/A;  . TUBAL LIGATION     Family History:  Family History  Adopted: Yes   Family Psychiatric  History: Adopted does not know Tobacco Screening: Have you used any form of tobacco in the last 30 days? (Cigarettes, Smokeless Tobacco, Cigars, and/or Pipes): No (Patient only reports vaping daily) Social History:  Social History   Substance and Sexual Activity  Alcohol Use Yes  . Alcohol/week: 2.0 standard drinks  . Types: 2 Glasses of wine per week   Comment: 12/29/20 restarted      Social History   Substance and Sexual Activity  Drug Use No    Additional  Social History:                           Allergies:   Allergies  Allergen Reactions  . Codeine Nausea And Vomiting and Other (See Comments)    dizziness  . Hydrocodone Other (See Comments)    Dizzy; Tolerates oxycodone fine   Lab Results:  Results for orders placed or performed during the hospital encounter of 01/04/21 (from the past 48 hour(s))  Comprehensive metabolic panel     Status: Abnormal   Collection Time: 01/04/21  9:14 PM  Result Value Ref Range   Sodium 139 135 - 145 mmol/L   Potassium 4.1 3.5 - 5.1 mmol/L   Chloride 107 98 - 111 mmol/L   CO2 25 22 - 32 mmol/L   Glucose, Bld 91 70 - 99 mg/dL    Comment: Glucose reference range applies only to samples taken after fasting for at least 8 hours.   BUN 9 6 - 20 mg/dL   Creatinine, Ser 0.77 0.44 - 1.00 mg/dL   Calcium 8.5 (L) 8.9 - 10.3 mg/dL   Total Protein 6.2 (L) 6.5 - 8.1 g/dL   Albumin 3.3 (L) 3.5 - 5.0 g/dL   AST 18 15 - 41 U/L   ALT 14 0 - 44 U/L   Alkaline Phosphatase 97 38 - 126 U/L   Total Bilirubin 0.2 (L) 0.3 - 1.2 mg/dL   GFR, Estimated >60 >60 mL/min    Comment: (NOTE) Calculated using the CKD-EPI Creatinine Equation (2021)    Anion gap 7 5 - 15    Comment: Performed at [facility], [place] [address], [place], [place] [address]  Ethanol     Status: Abnormal   Collection Time: 01/04/21  9:14 PM  Result Value Ref Range   Alcohol, Ethyl (B) 168 (H) <10 mg/dL    Comment: (NOTE) Lowest detectable limit for serum alcohol is 10 mg/dL.  For medical purposes only. Performed at [facility], [place] [address], [place], [place] [address]   Salicylate level     Status: Abnormal   Collection Time: 01/04/21  9:14 PM  Result Value Ref Range   Salicylate Lvl <6.2 (L) 7.0 - 30.0 mg/dL    Comment: Performed at [facility] [address], [place], [place] [address]  Acetaminophen level     Status: Abnormal   Collection Time: 01/04/21  9:14 PM  Result Value Ref Range   Acetaminophen  (Tylenol), Serum <10 (L) 10 - 30 ug/mL    Comment: (NOTE) Therapeutic concentrations vary significantly. A range of 10-30 ug/mL  may be an effective concentration for many patients. However, some  are best treated at concentrations outside of this range. Acetaminophen concentrations >150 ug/mL at 4 hours after ingestion  and >50 ug/mL at 12 hours after ingestion are often associated with  toxic reactions.  Performed at [facility], [place] [address], [place], [place] [address]   cbc     Status: Abnormal   Collection Time: 01/04/21  9:14 PM  Result Value Ref Range   WBC 5.1 4.0 - 10.5 K/uL   RBC 4.48 3.87 - 5.11 MIL/uL   Hemoglobin 12.4 12.0 - 15.0 g/dL   HCT 39.4 36.0 - 46.0 %   MCV 87.9 80.0 - 100.0 fL   MCH 27.7 26.0 - 34.0 pg   MCHC 31.5 30.0 - 36.0 g/dL   RDW 15.9 (H) 11.5 - 15.5 %   Platelets 222 150 - 400 K/uL   nRBC 0.0 0.0 - 0.2 %    Comment: Performed at [facility], [place] [address], [place], [place] [address]  I-Stat beta hCG blood, ED     Status: None   Collection Time: 01/04/21  9:45 PM  Result Value Ref Range   I-stat hCG, quantitative <5.0 <5 mIU/mL   Comment 3            Comment:   GEST. AGE      CONC.  (mIU/mL)   <=1 WEEK        5 - 50     2 WEEKS       50 - 500     3 WEEKS       100 - 10,000     4 WEEKS     1,000 - 30,000        FEMALE AND NON-PREGNANT FEMALE:     LESS THAN 5 mIU/mL   CBG monitoring, ED     Status: None   Collection Time: 01/04/21 11:05 PM  Result Value Ref Range   Glucose-Capillary 91 70 - 99 mg/dL    Comment: Glucose reference range applies only to samples taken after fasting for at least 8 hours.  Acetaminophen level     Status: Abnormal   Collection Time: 01/05/21  1:26 AM  Result Value Ref Range   Acetaminophen (Tylenol), Serum <10 (L) 10 - 30 ug/mL    Comment: (NOTE) Therapeutic concentrations vary significantly. A range of 10-30 ug/mL  may be an effective concentration for many patients. However, some  are best  treated at concentrations outside of this range. Acetaminophen concentrations >150 ug/mL at 4 hours after ingestion  and >50 ug/mL at 12 hours after ingestion are often associated with  toxic reactions.  Performed at [facility], [place] [address] [place], [place] [address]   Resp Panel by RT-PCR (Flu A&B, Covid) Nasopharyngeal Swab     Status: None   Collection Time: 01/05/21  2:58 AM   Specimen: Nasopharyngeal Swab; Nasopharyngeal(NP) swabs in vial transport medium  Result Value Ref Range   SARS Coronavirus 2 by RT PCR NEGATIVE NEGATIVE    Comment: (NOTE) SARS-CoV-2 target nucleic acids are NOT DETECTED.  The SARS-CoV-2 RNA is generally detectable in upper respiratory specimens during the acute phase  of infection. The lowest concentration of SARS-CoV-2 viral copies this assay can detect is 138 copies/mL. A negative result does not preclude SARS-Cov-2 infection and should not be used as the sole basis for treatment or other patient management decisions. A negative result may occur with  improper specimen collection/handling, submission of specimen other than nasopharyngeal swab, presence of viral mutation(s) within the areas targeted by this assay, and inadequate number of viral copies(<138 copies/mL). A negative result must be combined with clinical observations, patient history, and epidemiological information. The expected result is Negative.  Fact Sheet for Patients:  EntrepreneurPulse.com.au  Fact Sheet for Healthcare Providers:  IncredibleEmployment.be  This test is no t yet approved or cleared by the [place] FDA and  has been authorized for detection and/or diagnosis of SARS-CoV-2 by FDA under an Emergency Use Authorization (EUA). This EUA will remain  in effect (meaning this test can be used) for the duration of the COVID-19 declaration under Section 564(b)(1) of the Act, 21 U.S.C.section 360bbb-3(b)(1), unless the  authorization is terminated  or revoked sooner.       Influenza A by PCR NEGATIVE NEGATIVE   Influenza B by PCR NEGATIVE NEGATIVE    Comment: (NOTE) The Xpert Xpress SARS-CoV-2/FLU/RSV plus assay is intended as an aid in the diagnosis of influenza from Nasopharyngeal swab specimens and should not be used as a sole basis for treatment. Nasal washings and aspirates are unacceptable for Xpert Xpress SARS-CoV-2/FLU/RSV testing.  Fact Sheet for Patients: EntrepreneurPulse.com.au  Fact Sheet for Healthcare Providers: IncredibleEmployment.be  This test is not yet approved or cleared by the [place] FDA and has been authorized for detection and/or diagnosis of SARS-CoV-2 by FDA under an Emergency Use Authorization (EUA). This EUA will remain in effect (meaning this test can be used) for the duration of the COVID-19 declaration under Section 564(b)(1) of the Act, 21 U.S.C. section 360bbb-3(b)(1), unless the authorization is terminated or revoked.  Performed at [facility], [place] [address], [place], [place] [address]     Blood Alcohol level:  Lab Results  Component Value Date   ETH 168 (H) 01/04/2021   ETH 288 (H) [id number]    Metabolic Disorder Labs:  Lab Results  Component Value Date   HGBA1C 5.8 (H) 06/08/2020   MPG 119.76 06/08/2020   MPG 114.02 04/28/2020   No results found for: PROLACTIN No results found for: CHOL, TRIG, HDL, CHOLHDL, VLDL, LDLCALC  Current Medications: Current Facility-Administered Medications  Medication Dose Route Frequency Provider Last Rate Last Admin  . acetaminophen (TYLENOL) tablet 650 mg  650 mg Oral Q6H PRN Connye Burkitt, NP      . alum & mag hydroxide-simeth (MAALOX/MYLANTA) 200-200-20 MG/5ML suspension 30 mL  30 mL Oral Q4H PRN Connye Burkitt, NP      . budesonide (PULMICORT) nebulizer solution 0.5 mg  0.5 mg Nebulization Daily PRN Connye Burkitt, NP      . cloNIDine (CATAPRES) tablet 0.1  mg  0.1 mg Oral QID Connye Burkitt, NP   0.1 mg at 01/06/21 0819  . feeding supplement (ENSURE ENLIVE / ENSURE PLUS) liquid 237 mL  237 mL Oral BID BM Lindon Romp A, NP      . FLUoxetine (PROZAC) capsule 60 mg  60 mg Oral Daily Connye Burkitt, NP   60 mg at 01/06/21 0819  . fluticasone furoate-vilanterol (BREO ELLIPTA) 100-25 MCG/INH 1 puff  1 puff Inhalation Daily Connye Burkitt, NP      . gabapentin (  NEURONTIN) capsule 300 mg  300 mg Oral TID Connye Burkitt, NP   300 mg at 01/06/21 [id number]  . hydrOXYzine (ATARAX/VISTARIL) tablet 25 mg  25 mg Oral Q6H PRN Connye Burkitt, NP      . Derrill Memo ON 01/08/2021] hydrOXYzine (ATARAX/VISTARIL) tablet 25 mg  25 mg Oral TID PRN Connye Burkitt, NP      . liothyronine (CYTOMEL) tablet 10 mcg  10 mcg Oral Daily Connye Burkitt, NP      . loperamide (IMODIUM) capsule 2-4 mg  2-4 mg Oral PRN Connye Burkitt, NP      . LORazepam (ATIVAN) tablet 1 mg  1 mg Oral Q6H PRN Connye Burkitt, NP      . magnesium hydroxide (MILK OF MAGNESIA) suspension 30 mL  30 mL Oral Daily PRN Connye Burkitt, NP      . multivitamin with minerals tablet 1 tablet  1 tablet Oral Daily Connye Burkitt, NP      . OLANZapine zydis (ZYPREXA) disintegrating tablet 10 mg  10 mg Oral Q8H PRN Connye Burkitt, NP       And  . ziprasidone (GEODON) injection 20 mg  20 mg Intramuscular PRN Connye Burkitt, NP      . ondansetron (ZOFRAN-ODT) disintegrating tablet 4 mg  4 mg Oral Q6H PRN Connye Burkitt, NP      . progesterone (PROMETRIUM) capsule 200 mg  200 mg Oral Daily Connye Burkitt, NP      . propranolol (INDERAL) tablet 10 mg  10 mg Oral BID PRN Connye Burkitt, NP      . thiamine tablet 100 mg  100 mg Oral Daily Connye Burkitt, NP   100 mg at 01/06/21 0819  . traZODone (DESYREL) tablet 100 mg  100 mg Oral QHS PRN Connye Burkitt, NP      . umeclidinium bromide (INCRUSE ELLIPTA) 62.5 MCG/INH 1 puff  1 puff Inhalation Daily Connye Burkitt, NP       PTA Medications: Medications Prior to Admission  Medication  Sig Dispense Refill Last Dose  . acetaminophen (TYLENOL) 325 MG tablet Take 2 tablets (650 mg total) by mouth every 6 (six) hours as needed for mild pain (or temp > 100). (Patient not taking: Reported on 01/05/2021)     . Acetylcysteine (N-ACETYL-L-CYSTEINE) 600 MG CAPS Take 600 mg by mouth in the morning, at noon, and at bedtime.     . ADZENYS XR-ODT 12.5 MG TBED Take 25 mg by mouth as needed (adhd).     . albuterol (ACCUNEB) 1.25 MG/3ML nebulizer solution Take 1 ampule by nebulization as needed for wheezing or shortness of breath.     Marland Kitchen BREZTRI AEROSPHERE 160-9-4.8 MCG/ACT AERO Inhale 1 puff into the lungs in the morning and at bedtime.     . budesonide (PULMICORT) 0.5 MG/2ML nebulizer solution Take 2 mLs by nebulization daily as needed (shortness of breath).      . cloNIDine (CATAPRES) 0.1 MG tablet Take 0.1 mg by mouth 4 (four) times daily.     Marland Kitchen FLUoxetine HCl 60 MG TABS Take 60 mg by mouth daily.     Marland Kitchen gabapentin (NEURONTIN) 300 MG capsule Take 300-900 mg by mouth See admin instructions. Take 1 capsule (300mg ) by mouth three times daily and 3 capsules (900mg ) by mouth at bedtime.     . hydrOXYzine (VISTARIL) 25 MG capsule Take 25 mg by mouth 2 (two) times daily as needed for anxiety.     Marland Kitchen  ipratropium-albuterol (DUONEB) 0.5-2.5 (3) MG/3ML SOLN Take 3 mLs by nebulization every 6 (six) hours as needed. (Patient taking differently: Take 3 mLs by nebulization every 6 (six) hours as needed (shortness of breath/wheezing).) 360 mL 0   . liothyronine (CYTOMEL) 5 MCG tablet Take 10 mcg by mouth daily.     . methocarbamol (ROBAXIN) 500 MG tablet Take 2 tablets (1,000 mg total) by mouth every 6 (six) hours as needed for muscle spasms. (Patient not taking: No sig reported) 40 tablet 1   . Multiple Vitamin (MULTIVITAMIN WITH MINERALS) TABS tablet Take 1 tablet by mouth daily.     . ondansetron (ZOFRAN-ODT) 4 MG disintegrating tablet Take 1 tablet (4 mg total) by mouth every 6 (six) hours as needed for nausea.  (Patient not taking: Reported on 01/05/2021) 20 tablet 0   . oxyCODONE-acetaminophen (PERCOCET) 10-325 MG tablet Take 1 tablet by mouth every 6 (six) hours as needed for pain. (Patient not taking: No sig reported) 20 tablet 0   . PROAIR HFA 108 (90 Base) MCG/ACT inhaler Inhale 2 puffs into the lungs as needed for wheezing or shortness of breath.     . progesterone (PROMETRIUM) 100 MG capsule Take 200 mg by mouth daily.      . propranolol (INDERAL) 10 MG tablet Take 10 mg by mouth as needed (tremors).     . SUMAtriptan (IMITREX) 100 MG tablet Take 100 mg by mouth daily as needed for migraine.     . traZODone (DESYREL) 100 MG tablet Take 100 mg by mouth at bedtime as needed for sleep.      . Vitamin D, Ergocalciferol, (DRISDOL) 1.25 MG (50000 UNIT) CAPS capsule Take 50,000 Units by mouth once a week. Sundays       Musculoskeletal: Strength & Muscle Tone: within normal limits Gait & Station: normal Patient leans: N/A  Psychiatric Specialty Exam: Physical Exam Nursing note reviewed.  Constitutional:      General: She is not in acute distress.    Appearance: Normal appearance. She is normal weight. She is not ill-appearing, toxic-appearing or diaphoretic.  HENT:     Head: Normocephalic and atraumatic.  Cardiovascular:     Rate and Rhythm: Normal rate.  Musculoskeletal:        General: Normal range of motion.  Neurological:     General: No focal deficit present.     Mental Status: She is alert.     Review of Systems  Constitutional: Positive for fatigue. Negative for fever.  Respiratory: Positive for chest tightness. Negative for shortness of breath.   Cardiovascular: Negative for chest pain and palpitations.  Gastrointestinal: Negative for abdominal pain, constipation, diarrhea, nausea and vomiting.  Neurological: Positive for headaches. Negative for dizziness, weakness and light-headedness.  Psychiatric/Behavioral: Negative for suicidal ideas.    Blood pressure 136/83, pulse 69,  temperature 98.3 F (36.8 C), temperature source Oral, resp. rate 18, height 5\' 5"  (1.651 m), weight 72.1 kg, SpO2 99 %.Body mass index is 26.46 kg/m.  General Appearance: Disheveled  Eye Contact:  Minimal  Speech:  Garbled and Slow  Volume:  Decreased  Mood:  Flat  Affect:  Flat  Thought Process:  Coherent  Orientation:  Full (Time, Place, and Person)  Thought Content:  Logical  Suicidal Thoughts:  No  Homicidal Thoughts:  No  Memory:  Immediate;   Fair Recent;   Fair  Judgement:  Intact  Insight:  Shallow  Psychomotor Activity:  Normal  Concentration:  Concentration: Fair  Recall:  [employer]  of Knowledge:  Fair  Language:  Fair  Akathisia:  No  Handed:  Right  AIMS (if indicated):     Assets:  Resilience  ADL's:  Intact  Cognition:  WNL  Sleep:  Number of Hours: 5.75    Treatment Plan Summary: Daily contact with patient to assess and evaluate symptoms and progress in treatment    Mrs. Hyppolite is a 55 yr old female who presents with reported suicide attempt by ingestion ([place], 6 to 8 ounces of Pine-Sol, 5-6 tablets 25 mg Hydroxyzine, and 3 tablets of 0.1 mg clonidine) which patient denies. PPHx is significant for MDD, Recurrent, Severe, Anxiety, EtOH use Disorder, 2 prior admissions to [facility] [id number]), 2 previous stays at [facility] [id number]), and 1 previous suicide attempt.   Differential diagnosis Unspecified Depressive Disorder rule out substance induced mood disorder. She has been restarted on her home medication regiment. She will need to restabilize on these, will need a few days. Encouraged attending group therapy to develop and refine coping skills. Discussed living situation and she reports interest in [place] so asked Social Worker to provide information. Will continue to monitor.    Unspecified Depressive Disorder: -Continue Prozac 60 mg daily   Alcohol Use Disorder: -Continue Gabapentin 300 mg TID -CIWA -Start Ativan 1  mg PRN CIWA>10 -Continue thiamine 100 mg daily   Asthma  possible COPD: -Start Albuterol 1-2 puffs q6 PRN -Continue Incruse Ellipta 1 puff daily -Continue Breo Ellipta 1 puff daily -Continue Budesonide  0.5 mg daily PRN SOB   -Continue Ensure BID -Continue Zofran ODT 4 mg q6 PRN -Continue Agitation Protocol Zyprexa 10 mg/Ativan 1 mg/ Geodon 20 mg IM -Continue Propranolol 10 mg daily -Continue multivitamin daily -Continue Clonidine 0.1 mg QID -Continue Liothyronine 10 mcg daily -Continue PRN's: Tylenol, Maalox, Atarax, Milk of Magnesia, Trazodone    Observation Level/Precautions:  15 minute checks  Laboratory: CBC: WNL except RDW:15.9 (high)  CMP: WNL except the following Ca:8.5 (low)  Protein: 6.2 (low)  Albumin: 3.3. (low) Acetaminophen/Salicylate: WNL TSH/Lipid Panel/ A1c ordered to be drawn  Psychotherapy:    Medications:  Prozac, Gabapentin  Consultations:    Discharge Concerns:    Estimated LOS: 3-4 days  Other:     Physician Treatment Plan for Primary Diagnosis: Depression, unspecified  Long Term Goal(s): Improvement in symptoms so as ready for discharge  Short Term Goals: Ability to identify changes in lifestyle to reduce recurrence of condition will improve, Ability to verbalize feelings will improve, Ability to disclose and discuss suicidal ideas, Ability to demonstrate self-control will improve, Ability to identify and develop effective coping behaviors will improve and Ability to identify triggers associated with substance abuse/mental health issues will improve  Physician Treatment Plan for Secondary Diagnosis: Principal Problem:   Depression, unspecified Active Problems:   Substance induced mood disorder ([place])  Long Term Goal(s): Improvement in symptoms so as ready for discharge  Short Term Goals: Ability to identify changes in lifestyle to reduce recurrence of condition will improve, Ability to verbalize feelings will improve, Ability to disclose and  discuss suicidal ideas, Ability to demonstrate self-control will improve, Ability to identify and develop effective coping behaviors will improve and Ability to identify triggers associated with substance abuse/mental health issues will improve  I certify that inpatient services furnished can reasonably be expected to improve the patient's condition.    Briant Cedar, MD 2/3/202212:13 PM

## 2021-01-06 NOTE — Progress Notes (Signed)
Given patients complex medication regiment I double checked with Pharmacy. She has been getting her medications filled every 30 days for several months all at a [facility]. I then double checked with the patient and she confirmed that she has been taking it as prescribed except for the Liothyronine. She reports she only took a couple doses but not consistently and will not take it so will stop that. On chart review she has been prescribed Imitrex 100 mg for migraines so will start that.    Plan: -Stop Liothyronine -Start Imitrex 100 mg q2 hours

## 2021-01-06 NOTE — Progress Notes (Signed)
Admission Note MRN: [id number] [name]  01/05/2021  D: Patient is 55 year old female presenting from [facility] involuntarily. Patient reports being homeless and living in a hotel for the past week. Patient identifies her stressors as financial and her living situation. Patient also reports stress regarding her belongings being at the [place] in [place] [phone]) as she was supposed to check out earlier in the day. Patient presents with irritable affect at time of assessment. Patient became increasingly agitated and threw inhaler at RN after being informed she would not be able to take it on the unit. Patient denies SI/HI at this time. Patient also denies AH/VH at this time. Patient contracts for safety.  A: Provided positive reinforcement and encouragement.  R: Patient cooperative and receptive. Patient remains safe on the unit.

## 2021-01-07 LAB — RAPID URINE DRUG SCREEN, HOSP PERFORMED
Amphetamines: NOT DETECTED
Barbiturates: NOT DETECTED
Benzodiazepines: POSITIVE — AB
Cocaine: NOT DETECTED
Opiates: NOT DETECTED
Tetrahydrocannabinol: NOT DETECTED

## 2021-01-07 LAB — T4, FREE: Free T4: 0.73 ng/dL (ref 0.61–1.12)

## 2021-01-07 LAB — HEMOGLOBIN A1C
Hgb A1c MFr Bld: 5.6 % (ref 4.8–5.6)
Mean Plasma Glucose: 114.02 mg/dL

## 2021-01-07 MED ORDER — LORAZEPAM 2 MG/ML IJ SOLN: 1.0000 mg | Freq: Four times a day (QID) | INTRAMUSCULAR | Status: DC | PRN

## 2021-01-07 MED ORDER — LORAZEPAM 2 MG/ML IJ SOLN
2.0000 mg | Freq: Once | INTRAMUSCULAR | Status: AC
  Administered 2021-01-07: 2 mg via INTRAMUSCULAR

## 2021-01-07 MED ORDER — LORAZEPAM 2 MG/ML IJ SOLN
INTRAMUSCULAR | Status: AC
  Administered 2021-01-07: 2 mg
  Filled 2021-01-07: qty 1

## 2021-01-07 MED ORDER — DIPHENHYDRAMINE HCL 50 MG/ML IJ SOLN
INTRAMUSCULAR | Status: AC
  Administered 2021-01-07: 50 mg
  Filled 2021-01-07: qty 1

## 2021-01-07 MED ORDER — HALOPERIDOL LACTATE 5 MG/ML IJ SOLN
INTRAMUSCULAR | Status: AC
  Administered 2021-01-07: 10 mg
  Filled 2021-01-07: qty 2

## 2021-01-07 MED ORDER — HALOPERIDOL LACTATE 5 MG/ML IJ SOLN
10.0000 mg | Freq: Once | INTRAMUSCULAR | Status: AC
  Administered 2021-01-07: 10 mg via INTRAMUSCULAR
  Filled 2021-01-07: qty 2

## 2021-01-07 MED ORDER — DIPHENHYDRAMINE HCL 50 MG/ML IJ SOLN
50.0000 mg | Freq: Once | INTRAMUSCULAR | Status: AC
  Administered 2021-01-07: 50 mg via INTRAMUSCULAR
  Filled 2021-01-07: qty 1

## 2021-01-07 MED ORDER — LORAZEPAM 1 MG PO TABS: 1.0000 mg | ORAL_TABLET | Freq: Four times a day (QID) | ORAL | Status: DC | PRN

## 2021-01-07 NOTE — Progress Notes (Incomplete)
Pt A & O X4. Verbal outburst X2 "I want to fucking leave here. I don't feel safe here. I want my inhaler, my nebulizer treatment now". Pt continued to escalate in her behavior, verbal redirections was ineffective. Pt given IM MPresents with avertive eye contact, blunted affect and irritable mood, minimal on interactions. Observed with verbal outburst X1 this afternoon after declining her scheduled noon medications "No, I don't want anything, I'm dizzy". Orthostatic vitals done and WNL at the time.

## 2021-01-07 NOTE — [facility] Group Notes (Signed)
[facility] LCSW Group Therapy  01/07/2021 2:17 PM  Type of Therapy:  Group Therapy:Why you should go to therapy Ted Talk/ Challenging Negative thoughts  Participation Level:  Did Not Attend   Mliss Fritz 01/07/2021, 2:17 PM

## 2021-01-07 NOTE — Progress Notes (Addendum)
[facility] MD Progress Note  01/07/2021 11:16 AM [name]  MRN:  [id number] Subjective:   Mrs. Distel is a 55 yr old female who presents with reported suicide attempt by ingestion ([place], 6 to 8 ounces of Pine-Sol, 5-6 tablets 25 mg Hydroxyzine, and 3 tablets of 0.1 mg clonidine) which patient denies. PPHx is significant for MDD, Recurrent, Severe, Anxiety, EtOH use Disorder, 2 prior admissions to [id number]), 2 previous stays at [facility] [phone]), and 1 previous suicide attempt.  Today she is more annoyed with staff, she seems put out answering questions. She reports that she is getting poor care because it "took forever" to get her inhaler. She reports that she is having some sweating but denies tremors or cravings. She says she cannot go home because her husband said they will be selling their house since they are separating so she will have money after it is sold. She reports not sleeping well. She reports that she has only eaten a few bites of grits yesterday but otherwise not eating much. She reports no SI, HI, or AVH. When asked why she was taking progesterone she stated it was for hot flashes. But she became more agitated and then stated that she was not going to take any medications. She reported she lost her job and that nobody cares.   Update 3:30- Paged by code star to her room. She hit the button because she said she needed a breathing treatment for her asthma. She then became more agitated screaming that she needed her inhaler. She said that she was getting terrible care because we were incompetent by not being able to get her her inhaler. She then began throwing things at staff and was not redirectable. I then ordered Haldol 10, Benadryl 50, and Ativan 2 mg IM to be given to ensure the safety of the patient, other patients and staff. She took the medication without further incident and she was brought her inhaler to receive treatment. She also said she didn't matter and  had nothing to live for anymore because no one wanted her and she had no job.    Principal Problem: Depression, unspecified Diagnosis: Principal Problem:   Depression, unspecified Active Problems:   Alcohol use  Total Time spent with patient: 20 minutes  Past Psychiatric History: MDD, Recurrent, Severe, Anxiety, EtOH use Disorder, 2 prior admissions to [id number]), 2 previous stays at [facility] [phone]), and 1 previous suicide attempt.  Past Medical History:  Past Medical History:  Diagnosis Date  . ADHD (attention deficit hyperactivity disorder)   . Alcohol dependence with acute alcoholic intoxication with complication ([place]) 0000000  . Anemia 1990s  . Anxiety   . Arthritis    neck  . Asthma    uses inhalers every day  . Cancer ([place])    bladder  . Cutaneous lupus erythematosus   . 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 History of anemia   . Severe episode of recurrent major depressive disorder, without psychotic features ([place]) 01/07/2020  . Sleep apnea    mild  . SUI (stress urinary incontinence, female)     Past Surgical History:  Procedure Laterality Date  . BLADDER SUSPENSION N/A 02/13/2013   Procedure: [facility] SLING;  Surgeon: Malka So, MD;  Location: [facility];  Service: Urology;  Laterality: N/A;  . CESAREAN SECTION  12-23-2004    W/ LEFT TUBAL LIGATION AND RIGHT SALPINGECTOMY  . CESAREAN SECTION  1995  .  CYSTOSCOPY WITH STENT PLACEMENT Bilateral 06/16/2020   Procedure: CYSTOSCOPY WITH FIREFLY INJECTION;  Surgeon: Lucas Mallow, MD;  Location: [facility];  Service: Urology;  Laterality: Bilateral;  . IR RADIOLOGIST EVAL & MGMT  04/21/2020  . IR RADIOLOGIST EVAL & MGMT  05/12/2020  . ORIF HUMERUS FRACTURE Right 04/28/2014   Procedure: OPEN REDUCTION INTERNAL FIXATION (ORIF) PROXIMAL HUMERUS FRACTURE;  Surgeon: Nita Sells, MD;  Location: [facility];  Service: Orthopedics;  Laterality: Right;  . ORIF PROXIMAL  HUMERUS FRACTURE Right 04/28/2014   DR CHANDLER  . PROCTOSCOPY N/A 06/16/2020   Procedure: RIGID PROCTOSCOPY;  Surgeon: Michael Boston, MD;  Location: [facility];  Service: General;  Laterality: N/A;  . TUBAL LIGATION     Family History:  Family History  Adopted: Yes   Family Psychiatric  History: Adopted does not know Social History:  Social History   Substance and Sexual Activity  Alcohol Use Yes  . Alcohol/week: 2.0 standard drinks  . Types: 2 Glasses of wine per week   Comment: 12/29/20 restarted      Social History   Substance and Sexual Activity  Drug Use No    Social History   Socioeconomic History  . Marital status: Married    Spouse name: Not on file  . Number of children: Not on file  . Years of education: Not on file  . Highest education level: Not on file  Occupational History  . Not on file  Tobacco Use  . Smoking status: Former Smoker    Packs/day: 0.25    Years: 20.00    Pack years: 5.00    Types: Cigarettes    Quit date: 09/24/2019    Years since quitting: 1.2  . Smokeless tobacco: Never Used  Vaping Use  . Vaping Use: Every day  Substance and Sexual Activity  . Alcohol use: Yes    Alcohol/week: 2.0 standard drinks    Types: 2 Glasses of wine per week    Comment: 12/29/20 restarted   . Drug use: No  . Sexual activity: Not Currently    Birth control/protection: Post-menopausal  Other Topics Concern  . Not on file  Social History Narrative  . Not on file   Social Determinants of Health   Financial Resource Strain: Not on file  Food Insecurity: Not on file  Transportation Needs: Not on file  Physical Activity: Not on file  Stress: Not on file  Social Connections: Not on file   Additional Social History:                         Sleep: Fair  Appetite:  Has eaten almost nothing except a few bites yesterday  Current Medications: Current Facility-Administered Medications  Medication Dose Route Frequency Provider Last Rate Last  Admin  . acetaminophen (TYLENOL) tablet 650 mg  650 mg Oral Q6H PRN Connye Burkitt, NP      . albuterol (VENTOLIN HFA) 108 (90 Base) MCG/ACT inhaler 1-2 puff  1-2 puff Inhalation Q6H PRN Briant Cedar, MD   2 puff at 01/06/21 1258  . alum & mag hydroxide-simeth (MAALOX/MYLANTA) 200-200-20 MG/5ML suspension 30 mL  30 mL Oral Q4H PRN Connye Burkitt, NP      . budesonide (PULMICORT) nebulizer solution 0.5 mg  0.5 mg Nebulization Daily PRN Connye Burkitt, NP      . cloNIDine (CATAPRES) tablet 0.1 mg  0.1 mg Oral QID Connye Burkitt, NP   0.1 mg  at 01/07/21 0826  . feeding supplement (ENSURE ENLIVE / ENSURE PLUS) liquid 237 mL  237 mL Oral BID BM Lindon Romp A, NP   237 mL at 01/06/21 1306  . FLUoxetine (PROZAC) capsule 60 mg  60 mg Oral Daily Connye Burkitt, NP   60 mg at 01/07/21 5732  . fluticasone furoate-vilanterol (BREO ELLIPTA) 100-25 MCG/INH 1 puff  1 puff Inhalation Daily Connye Burkitt, NP   1 puff at 01/07/21 0827  . gabapentin (NEURONTIN) capsule 300 mg  300 mg Oral TID Viann Fish E, MD   300 mg at 01/07/21 2025   And  . gabapentin (NEURONTIN) capsule 900 mg  900 mg Oral QHS Nelda Marseille, Zevin Nevares E, MD   900 mg at 01/06/21 2113  . hydrOXYzine (ATARAX/VISTARIL) tablet 25 mg  25 mg Oral Q6H PRN Connye Burkitt, NP      . Derrill Memo ON 01/08/2021] hydrOXYzine (ATARAX/VISTARIL) tablet 25 mg  25 mg Oral TID PRN Connye Burkitt, NP      . loperamide (IMODIUM) capsule 2-4 mg  2-4 mg Oral PRN Connye Burkitt, NP      . LORazepam (ATIVAN) tablet 1 mg  1 mg Oral Q6H PRN Connye Burkitt, NP   1 mg at 01/06/21 1538  . magnesium hydroxide (MILK OF MAGNESIA) suspension 30 mL  30 mL Oral Daily PRN Connye Burkitt, NP      . multivitamin with minerals tablet 1 tablet  1 tablet Oral Daily Connye Burkitt, NP   1 tablet at 01/07/21 [phone]  . OLANZapine zydis (ZYPREXA) disintegrating tablet 10 mg  10 mg Oral Q8H PRN Connye Burkitt, NP       And  . ziprasidone (GEODON) injection 20 mg  20 mg Intramuscular PRN Connye Burkitt, NP      . ondansetron (ZOFRAN-ODT) disintegrating tablet 4 mg  4 mg Oral Q6H PRN Connye Burkitt, NP      . progesterone (PROMETRIUM) capsule 200 mg  200 mg Oral Daily Connye Burkitt, NP   200 mg at 01/07/21 6237  . propranolol (INDERAL) tablet 10 mg  10 mg Oral BID PRN Connye Burkitt, NP      . SUMAtriptan (IMITREX) tablet 100 mg  100 mg Oral Q2H PRN Briant Cedar, MD   100 mg at 01/06/21 1532  . thiamine tablet 100 mg  100 mg Oral Daily Connye Burkitt, NP   100 mg at 01/07/21 6283  . traZODone (DESYREL) tablet 100 mg  100 mg Oral QHS PRN Connye Burkitt, NP   100 mg at 01/06/21 2113  . umeclidinium bromide (INCRUSE ELLIPTA) 62.5 MCG/INH 1 puff  1 puff Inhalation Daily Connye Burkitt, NP   1 puff at 01/07/21 1517    Lab Results:  Results for orders placed or performed during the hospital encounter of 01/05/21 (from the past 48 hour(s))  Hemoglobin A1c     Status: None   Collection Time: 01/06/21  6:19 PM  Result Value Ref Range   Hgb A1c MFr Bld 5.6 4.8 - 5.6 %    Comment: (NOTE) Pre diabetes:          5.7%-6.4%  Diabetes:              >6.4%  Glycemic control for   <7.0% adults with diabetes    Mean Plasma Glucose 114.02 mg/dL    Comment: Performed at [facility] [address], [place], [place] [address]  Lipid panel     Status: None   Collection Time: 01/06/21  6:19 PM  Result Value Ref Range   Cholesterol 157 0 - 200 mg/dL   Triglycerides 76 <150 mg/dL   HDL 60 >40 mg/dL   Total CHOL/HDL Ratio 2.6 RATIO   VLDL 15 0 - 40 mg/dL   LDL Cholesterol 82 0 - 99 mg/dL    Comment:        Total Cholesterol/HDL:CHD Risk Coronary Heart Disease Risk Table                     Men   Women  1/2 Average Risk   3.4   3.3  Average Risk       5.0   4.4  2 X Average Risk   9.6   7.1  3 X Average Risk  23.4   11.0        Use the calculated Patient Ratio above and the CHD Risk Table to determine the patient's CHD Risk.        ATP III CLASSIFICATION (LDL):  <100      mg/dL   Optimal  100-129  mg/dL   Near or Above                    Optimal  130-159  mg/dL   Borderline  160-189  mg/dL   High  >190     mg/dL   Very High Performed at [facility] [address] [place], [place] [address]   TSH     Status: None   Collection Time: 01/06/21  6:19 PM  Result Value Ref Range   TSH 0.472 0.350 - 4.500 uIU/mL    Comment: Performed by a 3rd Generation assay with a functional sensitivity of <=0.01 uIU/mL. Performed at [facility], [place] [address] [place], [place] [address]   Urine rapid drug screen (hosp performed)not at [facility]     Status: Abnormal   Collection Time: 01/07/21  5:26 AM  Result Value Ref Range   Opiates NONE DETECTED NONE DETECTED   Cocaine NONE DETECTED NONE DETECTED   Benzodiazepines POSITIVE (A) NONE DETECTED   Amphetamines NONE DETECTED NONE DETECTED   Tetrahydrocannabinol NONE DETECTED NONE DETECTED   Barbiturates NONE DETECTED NONE DETECTED    Comment: (NOTE) DRUG SCREEN FOR MEDICAL PURPOSES ONLY.  IF CONFIRMATION IS NEEDED FOR ANY PURPOSE, NOTIFY LAB WITHIN 5 DAYS.  LOWEST DETECTABLE LIMITS FOR URINE DRUG SCREEN Drug Class                     Cutoff (ng/mL) Amphetamine and metabolites    1000 Barbiturate and metabolites    200 Benzodiazepine                 [id number] Tricyclics and metabolites     300 Opiates and metabolites        300 Cocaine and metabolites        300 THC                            50 Performed at [facility], [place] [address], [place],  [address]   T4, free     Status: None   Collection Time: 01/07/21  6:20 AM  Result Value Ref Range   Free T4 0.73 0.61 - 1.12 ng/dL    Comment: (NOTE) Biotin ingestion may interfere with free T4  tests. If the results are inconsistent with the TSH level, previous test results, or the clinical presentation, then consider biotin interference. If needed, order repeat testing after stopping biotin. Performed at  [facility], [place] [address] [place], [place] [address]     Blood Alcohol level:  Lab Results  Component Value Date   ETH 168 (H) 01/04/2021   ETH 288 (H) [id number]    Metabolic Disorder Labs: Lab Results  Component Value Date   HGBA1C 5.6 01/06/2021   MPG 114.02 01/06/2021   MPG 119.76 06/08/2020   No results found for: PROLACTIN Lab Results  Component Value Date   CHOL 157 01/06/2021   TRIG 76 01/06/2021   HDL 60 01/06/2021   CHOLHDL 2.6 01/06/2021   VLDL 15 01/06/2021   LDLCALC 82 01/06/2021    Physical Findings: AIMS:  , ,  ,  ,    CIWA:  CIWA-Ar Total: 8 COWS:     Musculoskeletal: Strength & Muscle Tone: within normal limits Gait & Station: normal Patient leans: N/A  Psychiatric Specialty Exam: Physical Exam Nursing note reviewed.  Constitutional:      General: She is not in acute distress.    Appearance: Normal appearance. She is normal weight. She is not ill-appearing, toxic-appearing or diaphoretic.  HENT:     Head: Normocephalic and atraumatic.  Cardiovascular:     Rate and Rhythm: Normal rate.  Pulmonary:     Effort: Pulmonary effort is normal.  Musculoskeletal:        General: Normal range of motion.  Neurological:     General: No focal deficit present.     Mental Status: She is alert.     Review of Systems  Constitutional: Positive for fatigue. Negative for fever.  Respiratory: Positive for chest tightness (due to asthma) and shortness of breath (due to asthma).   Cardiovascular: Negative for chest pain and palpitations.  Gastrointestinal: Negative for abdominal pain, constipation, diarrhea, nausea and vomiting.  Neurological: Positive for dizziness and light-headedness. Negative for weakness and numbness.  Psychiatric/Behavioral: Negative for suicidal ideas. The patient is nervous/anxious.     Blood pressure 103/77, pulse 70, temperature 98 F (36.7 C), temperature source Oral, resp. rate 16, height 5\' 5"  (1.651 m), weight 72.1  kg, SpO2 100 %.Body mass index is 26.46 kg/m.  General Appearance: Casual and in scrubs  Eye Contact:  Poor  Speech:  Garbled and Normal Rate  Volume:  Decreased  Mood:  Angry, Anxious and Dysphoric  Affect:  Blunt and Angry/Argumentative  Thought Process:  Disorganized  Orientation:  Full (Time, Place, and Person)  Thought Content:  Logical  Suicidal Thoughts:  No  Homicidal Thoughts:  No  Memory:  Immediate;   Fair Recent;   Fair  Judgement:  Impaired  Insight:  Lacking  Psychomotor Activity:  Normal  Concentration:  Concentration: Fair and Attention Span: Fair  Recall:  [employer] of Knowledge:  Good  Language:  Good  Akathisia:  No  Handed:  Right  AIMS (if indicated):     Assets:  Resilience  ADL's:  Intact  Cognition:  WNL  Sleep:  Number of Hours: 4.5     Treatment Plan Summary: Daily contact with patient to assess and evaluate symptoms and progress in treatment  Mrs. Delcarmen is a 55 yr old female who presents with reported suicide attempt by ingestion ([place], 6 to 8 ounces of Pine-Sol, 5-6 tablets 25 mg Hydroxyzine, and 3 tablets of 0.1 mg clonidine) which  patient denies. PPHx is significant for MDD, Recurrent, Severe, Anxiety, EtOH use Disorder, 2 prior admissions to [facility] ([phone]), 2 previous stays at [facility] [phone]), and 1 previous suicide attempt.  She became aggressive today and agitated. She claims she has not been drinking much but it was reported that she was drinking 2 drinks a day and so may be in early withdrawal. She is on CIWA and will continue on it. Given her new threats of self harm today she may need to be IVC'd if she attempts discharge before stabilizing. Will continue to monitor. Will make no changes to medications at this time but may need to if Depression worsens or threats of suicide continue.   Unspecified Depressive Disorder: -Continue Prozac 60 mg daily   Alcohol Use Disorder: -Continue Gabapentin 300 mg TID and  900mg  qhs -CIWA -Start Ativan 1 mg PRN CIWA>10 -Continue thiamine 100 mg daily   Asthma  possible COPD: -Start Albuterol 1-2 puffs q6 PRN -Continue Incruse Ellipta 1 puff daily -Continue Breo Ellipta 1 puff daily -Continue Budesonide  0.5 mg daily PRN SOB   -Continue Ensure BID -Continue Zofran ODT 4 mg q6 PRN -Continue Agitation Protocol Zyprexa 10 mg/Ativan 1 mg/ Geodon 20 mg IM -Continue Propranolol 10 mg daily -Continue multivitamin daily -Continue Clonidine 0.1 mg QID -Continue Liothyronine 10 mcg daily -Continue PRN's: Tylenol, Maalox, Atarax, Milk of Magnesia, Trazodone   Briant Cedar, MD 01/07/2021, 11:16 AM

## 2021-01-07 NOTE — Progress Notes (Signed)
Recreation Therapy Notes  Date: 2.4.22 Time: 0930 Location: [address] Dayroom  Group Topic: Stress Management  Goal Area(s) Addresses:  Patient will identify positive stress management techniques. Patient will identify benefits of using stress management post d/c.  Intervention: Stress Management  Activity: Meditation.  LRT played a meditation that focused on making the most of your day and looking at each day as a blank page to create new memories or accomplish new goals.    Education:  Stress Management, Discharge Planning.   Education Outcome: Acknowledges Education  Clinical Observations/Feedback: Pt did not attend group activity.   Victorino Sparrow, LRT/CTRS         Ria Comment, Fue Cervenka A 01/07/2021 11:13 AM

## 2021-01-07 NOTE — Tx Team (Signed)
Interdisciplinary Treatment and Diagnostic Plan Update  01/07/2021 Time of Session: 9:05am  ADONICA FUKUSHIMA MRN: [id number]  Principal Diagnosis: Depression, unspecified  Secondary Diagnoses: Principal Problem:   Depression, unspecified Active Problems:   Alcohol use   Current Medications:  Current Facility-Administered Medications  Medication Dose Route Frequency Provider Last Rate Last Admin  . acetaminophen (TYLENOL) tablet 650 mg  650 mg Oral Q6H PRN Connye Burkitt, NP      . albuterol (VENTOLIN HFA) 108 (90 Base) MCG/ACT inhaler 1-2 puff  1-2 puff Inhalation Q6H PRN Briant Cedar, MD   2 puff at 01/06/21 1258  . alum & mag hydroxide-simeth (MAALOX/MYLANTA) 200-200-20 MG/5ML suspension 30 mL  30 mL Oral Q4H PRN Connye Burkitt, NP      . budesonide (PULMICORT) nebulizer solution 0.5 mg  0.5 mg Nebulization Daily PRN Connye Burkitt, NP      . cloNIDine (CATAPRES) tablet 0.1 mg  0.1 mg Oral QID Connye Burkitt, NP   0.1 mg at 01/07/21 0826  . feeding supplement (ENSURE ENLIVE / ENSURE PLUS) liquid 237 mL  237 mL Oral BID BM Lindon Romp A, NP   237 mL at 01/06/21 1306  . FLUoxetine (PROZAC) capsule 60 mg  60 mg Oral Daily Connye Burkitt, NP   60 mg at 01/07/21 6962  . fluticasone furoate-vilanterol (BREO ELLIPTA) 100-25 MCG/INH 1 puff  1 puff Inhalation Daily Connye Burkitt, NP   1 puff at 01/07/21 0827  . gabapentin (NEURONTIN) capsule 300 mg  300 mg Oral TID Viann Fish E, MD   300 mg at 01/07/21 9528   And  . gabapentin (NEURONTIN) capsule 900 mg  900 mg Oral QHS Nelda Marseille, Amy E, MD   900 mg at 01/06/21 2113  . hydrOXYzine (ATARAX/VISTARIL) tablet 25 mg  25 mg Oral Q6H PRN Connye Burkitt, NP      . Derrill Memo ON 01/08/2021] hydrOXYzine (ATARAX/VISTARIL) tablet 25 mg  25 mg Oral TID PRN Connye Burkitt, NP      . loperamide (IMODIUM) capsule 2-4 mg  2-4 mg Oral PRN Connye Burkitt, NP      . LORazepam (ATIVAN) tablet 1 mg  1 mg Oral Q6H PRN Connye Burkitt, NP   1 mg at 01/06/21 1538   . magnesium hydroxide (MILK OF MAGNESIA) suspension 30 mL  30 mL Oral Daily PRN Connye Burkitt, NP      . multivitamin with minerals tablet 1 tablet  1 tablet Oral Daily Connye Burkitt, NP   1 tablet at 01/07/21 [id number]  . OLANZapine zydis (ZYPREXA) disintegrating tablet 10 mg  10 mg Oral Q8H PRN Connye Burkitt, NP       And  . ziprasidone (GEODON) injection 20 mg  20 mg Intramuscular PRN Connye Burkitt, NP      . ondansetron (ZOFRAN-ODT) disintegrating tablet 4 mg  4 mg Oral Q6H PRN Connye Burkitt, NP      . progesterone (PROMETRIUM) capsule 200 mg  200 mg Oral Daily Connye Burkitt, NP   200 mg at 01/07/21 4401  . propranolol (INDERAL) tablet 10 mg  10 mg Oral BID PRN Connye Burkitt, NP      . SUMAtriptan (IMITREX) tablet 100 mg  100 mg Oral Q2H PRN Briant Cedar, MD   100 mg at 01/06/21 1532  . thiamine tablet 100 mg  100 mg Oral Daily Connye Burkitt, NP   100 mg at 01/07/21 0272  .  traZODone (DESYREL) tablet 100 mg  100 mg Oral QHS PRN Connye Burkitt, NP   100 mg at 01/06/21 2113  . umeclidinium bromide (INCRUSE ELLIPTA) 62.5 MCG/INH 1 puff  1 puff Inhalation Daily Connye Burkitt, NP   1 puff at 01/07/21 0827   PTA Medications: Medications Prior to Admission  Medication Sig Dispense Refill Last Dose  . acetaminophen (TYLENOL) 325 MG tablet Take 2 tablets (650 mg total) by mouth every 6 (six) hours as needed for mild pain (or temp > 100). (Patient not taking: Reported on 01/05/2021)     . Acetylcysteine (N-ACETYL-L-CYSTEINE) 600 MG CAPS Take 600 mg by mouth in the morning, at noon, and at bedtime.     . ADZENYS XR-ODT 12.5 MG TBED Take 25 mg by mouth as needed (adhd).     . albuterol (ACCUNEB) 1.25 MG/3ML nebulizer solution Take 1 ampule by nebulization as needed for wheezing or shortness of breath.     Marland Kitchen BREZTRI AEROSPHERE 160-9-4.8 MCG/ACT AERO Inhale 1 puff into the lungs in the morning and at bedtime.     . budesonide (PULMICORT) 0.5 MG/2ML nebulizer solution Take 2 mLs by  nebulization daily as needed (shortness of breath).      . cloNIDine (CATAPRES) 0.1 MG tablet Take 0.1 mg by mouth 4 (four) times daily.     Marland Kitchen FLUoxetine HCl 60 MG TABS Take 60 mg by mouth daily.     Marland Kitchen gabapentin (NEURONTIN) 300 MG capsule Take 300-900 mg by mouth See admin instructions. Take 1 capsule (315m) by mouth three times daily and 3 capsules (9033m by mouth at bedtime.     . hydrOXYzine (VISTARIL) 25 MG capsule Take 25 mg by mouth 2 (two) times daily as needed for anxiety.     . Marland Kitchenpratropium-albuterol (DUONEB) 0.5-2.5 (3) MG/3ML SOLN Take 3 mLs by nebulization every 6 (six) hours as needed. (Patient taking differently: Take 3 mLs by nebulization every 6 (six) hours as needed (shortness of breath/wheezing).) 360 mL 0   . liothyronine (CYTOMEL) 5 MCG tablet Take 10 mcg by mouth daily.     . methocarbamol (ROBAXIN) 500 MG tablet Take 2 tablets (1,000 mg total) by mouth every 6 (six) hours as needed for muscle spasms. (Patient not taking: No sig reported) 40 tablet 1   . Multiple Vitamin (MULTIVITAMIN WITH MINERALS) TABS tablet Take 1 tablet by mouth daily.     . ondansetron (ZOFRAN-ODT) 4 MG disintegrating tablet Take 1 tablet (4 mg total) by mouth every 6 (six) hours as needed for nausea. (Patient not taking: Reported on 01/05/2021) 20 tablet 0   . oxyCODONE-acetaminophen (PERCOCET) 10-325 MG tablet Take 1 tablet by mouth every 6 (six) hours as needed for pain. (Patient not taking: No sig reported) 20 tablet 0   . PROAIR HFA 108 (90 Base) MCG/ACT inhaler Inhale 2 puffs into the lungs as needed for wheezing or shortness of breath.     . progesterone (PROMETRIUM) 100 MG capsule Take 200 mg by mouth daily.      . propranolol (INDERAL) 10 MG tablet Take 10 mg by mouth as needed (tremors).     . SUMAtriptan (IMITREX) 100 MG tablet Take 100 mg by mouth daily as needed for migraine.     . traZODone (DESYREL) 100 MG tablet Take 100 mg by mouth at bedtime as needed for sleep.      . Vitamin D,  Ergocalciferol, (DRISDOL) 1.25 MG (50000 UNIT) CAPS capsule Take 50,000 Units by mouth once a week.  Sundays       Patient Stressors: Financial difficulties Marital or family conflict Occupational concerns  Patient Strengths: Physical Health  Treatment Modalities: Medication Management, Group therapy, Case management,  1 to 1 session with clinician, Psychoeducation, Recreational therapy.   Physician Treatment Plan for Primary Diagnosis: Depression, unspecified Long Term Goal(s): Improvement in symptoms so as ready for discharge Improvement in symptoms so as ready for discharge   Short Term Goals: Ability to identify changes in lifestyle to reduce recurrence of condition will improve Ability to verbalize feelings will improve Ability to disclose and discuss suicidal ideas Ability to demonstrate self-control will improve Ability to identify and develop effective coping behaviors will improve Ability to identify triggers associated with substance abuse/mental health issues will improve Ability to identify changes in lifestyle to reduce recurrence of condition will improve Ability to verbalize feelings will improve Ability to disclose and discuss suicidal ideas Ability to demonstrate self-control will improve Ability to identify and develop effective coping behaviors will improve Ability to identify triggers associated with substance abuse/mental health issues will improve  Medication Management: Evaluate patient's response, side effects, and tolerance of medication regimen.  Therapeutic Interventions: 1 to 1 sessions, Unit Group sessions and Medication administration.  Evaluation of Outcomes: Not Met  Physician Treatment Plan for Secondary Diagnosis: Principal Problem:   Depression, unspecified Active Problems:   Alcohol use  Long Term Goal(s): Improvement in symptoms so as ready for discharge Improvement in symptoms so as ready for discharge   Short Term Goals: Ability to  identify changes in lifestyle to reduce recurrence of condition will improve Ability to verbalize feelings will improve Ability to disclose and discuss suicidal ideas Ability to demonstrate self-control will improve Ability to identify and develop effective coping behaviors will improve Ability to identify triggers associated with substance abuse/mental health issues will improve Ability to identify changes in lifestyle to reduce recurrence of condition will improve Ability to verbalize feelings will improve Ability to disclose and discuss suicidal ideas Ability to demonstrate self-control will improve Ability to identify and develop effective coping behaviors will improve Ability to identify triggers associated with substance abuse/mental health issues will improve     Medication Management: Evaluate patient's response, side effects, and tolerance of medication regimen.  Therapeutic Interventions: 1 to 1 sessions, Unit Group sessions and Medication administration.  Evaluation of Outcomes: Not Met   RN Treatment Plan for Primary Diagnosis: Depression, unspecified Long Term Goal(s): Knowledge of disease and therapeutic regimen to maintain health will improve  Short Term Goals: Ability to remain free from injury will improve, Ability to demonstrate self-control, Ability to participate in decision making will improve, Ability to disclose and discuss suicidal ideas and Ability to identify and develop effective coping behaviors will improve  Medication Management: RN will administer medications as ordered by provider, will assess and evaluate patient's response and provide education to patient for prescribed medication. RN will report any adverse and/or side effects to prescribing provider.  Therapeutic Interventions: 1 on 1 counseling sessions, Psychoeducation, Medication administration, Evaluate responses to treatment, Monitor vital signs and CBGs as ordered, Perform/monitor CIWA, COWS, AIMS  and Fall Risk screenings as ordered, Perform wound care treatments as ordered.  Evaluation of Outcomes: Not Met   LCSW Treatment Plan for Primary Diagnosis: Depression, unspecified Long Term Goal(s): Safe transition to appropriate next level of care at discharge, Engage patient in therapeutic group addressing interpersonal concerns.  Short Term Goals: Engage patient in aftercare planning with referrals and resources, Facilitate acceptance of mental health diagnosis  and concerns, Facilitate patient progression through stages of change regarding substance use diagnoses and concerns, Identify triggers associated with mental health/substance abuse issues and Increase skills for wellness and recovery  Therapeutic Interventions: Assess for all discharge needs, 1 to 1 time with Social worker, Explore available resources and support systems, Assess for adequacy in community support network, Educate family and significant other(s) on suicide prevention, Complete Psychosocial Assessment, Interpersonal group therapy.  Evaluation of Outcomes: Not Met   Progress in Treatment: Attending groups: No. Participating in groups: No. Taking medication as prescribed: Yes. Toleration medication: Yes. Family/Significant other contact made: Yes, individual(s) contacted:  Husband  Patient understands diagnosis: No. Discussing patient identified problems/goals with staff: Yes. Medical problems stabilized or resolved: Yes. Denies suicidal/homicidal ideation: Yes. Issues/concerns per patient self-inventory: No.   New problem(s) identified: Yes, Describe:  Loss of Job/Homeless  New Short Term/Long Term Goal(s): medication stabilization, elimination of SI thoughts, development of comprehensive mental wellness plan.   Patient Goals:  "To find a place to live and a job"   Discharge Plan or Barriers: Patient recently admitted. CSW will continue to follow and assess for appropriate referrals and possible discharge  planning.   Reason for Continuation of Hospitalization: Anxiety Depression Medication stabilization Suicidal ideation Withdrawal symptoms  Estimated Length of Stay: 3 to 5 days   Attendees: Patient: Dejane Scheibe  01/07/2021   Physician: Lala Lund, MD 01/07/2021   Nursing:  01/07/2021   RN Care Manager: 01/07/2021   Social Worker: Verdis Frederickson, LCSWA 01/07/2021   Recreational Therapist:  01/07/2021   Other:  01/07/2021   Other:  01/07/2021   Other: 01/07/2021     Scribe for Treatment Team: Darleen Crocker, [place] 01/07/2021 10:27 AM

## 2021-01-08 MED ORDER — CLONIDINE HCL 0.1 MG PO TABS
0.1000 mg | ORAL_TABLET | Freq: Three times a day (TID) | ORAL | Status: DC
  Administered 2021-01-09 – 2021-01-11 (×7): 0.1 mg via ORAL
  Filled 2021-01-08 (×14): qty 1

## 2021-01-08 NOTE — Progress Notes (Signed)
Patient presents with a flat affect and depressed mood. She rates depression 0/10 and anxiety 8/10 (10 being worst). She denies SI//HI. She reports sleeping well at night. She c/o feeling dizzy this morning upon waking up. Vital reviewed and stable this am. At noon time the pt's b/p was 89/64 & HR 70. The noon dose of clonidine was held and fluids were provided to the pt. Margarita Grizzle, NP., made aware.The patient was encouraged to increase her fluid intake and fall risk education was provided to the pt.   Orders reviewed with pt. Vital signs assessed. Verbal support provided. 15 minute checks performed for safety. Fluids encouraged and provided.   Patient compliant with treatment plan.

## 2021-01-08 NOTE — [facility] Group Notes (Signed)
.  Psychoeducational Group Note  Date: 01-08-2021 Time: 0900-1000    Goal Setting   Purpose of Group: This group helps to provide patients with the steps of setting a goal that is specific, measurable, attainable, realistic and time specific. A discussion on how we keep ourselves stuck with negative self talk.    Participation Level:  Did not attend  [name]

## 2021-01-08 NOTE — Progress Notes (Signed)
   01/07/21 2200  COVID-19 Daily Checkoff  Have you had a fever (temp > 37.80C/100F)  in the past 24 hours?  No  If you have had runny nose, nasal congestion, sneezing in the past 24 hours, has it worsened? No  COVID-19 EXPOSURE  Have you traveled outside the state in the past 14 days? No  Have you been in contact with someone with a confirmed diagnosis of COVID-19 or PUI in the past 14 days without wearing appropriate PPE? No  Have you been living in the same home as a person with confirmed diagnosis of COVID-19 or a PUI (household contact)? No  Have you been diagnosed with COVID-19? No

## 2021-01-08 NOTE — Progress Notes (Signed)
[facility] MD Progress Note  01/08/2021 7:21 AM DONENE LASKO  MRN:  [id number] Subjective:   Mrs. Desmidt is a 55 yr old female who presents withreported suicideattempt by ingestion (1 Bahama Mama,6 to 8 ounces of Pine-Sol, 5-6 tablets 25 mgHydroxyzine, and 3 tablets of0.1 mg clonidine)which patient denies.PPHx is significant for MDD, Recurrent, Severe, Anxiety, EtOH use Disorder, 2 prior admissions to [facility] [id number]), 2 previous stays at [facility] [phone]), and 1 previous suicide attempt.  She was more calm today and less blunt/exacerbated during our discussion today.  Started off our discussion by reminding her that throwing objects at staff would not be tolerated on the unit and she apologized and stated that she would apologize to Dr. Claris Gower on Monday. She reports that her mood has slightly improved since yesterday. She reports that her sleep is good. When asked about her appetite she says that she has started eating more and has had some of every meal since yesterday. She reports no SI, HI, or AVH. She asked if Social Work would be able to assist her in housing as her husband is selling their house since they are separating. I discussed with her that Social Work would be able to provide resources. Her main concern was that she was feeling lightheaded and dizzy. Encouraged her to eat more and drink plenty of fluids. She asked for Gatorade and I provided her with a full cup as she started eating her lunch. Discussed that I would also decrease her Clonidine to TID. Encouraged her to attend group therapy today but when she said she had not had a shower I said that I would accept that as a goal and then attending evening group therapy.   Principal Problem: Depression, unspecified Diagnosis: Principal Problem:   Depression, unspecified Active Problems:   Alcohol use  Total Time spent with patient: 15 minutes  Past Psychiatric History: MDD, Recurrent, Severe, Anxiety, EtOH use  Disorder, 2 prior admissions to [facility] [id number]), 2 previous stays at [facility] [phone]), and 1 previous suicide attempt.  Past Medical History:  Past Medical History:  Diagnosis Date  . ADHD (attention deficit hyperactivity disorder)   . Alcohol dependence with acute alcoholic intoxication with complication ([place]) 0000000  . Anemia 1990s  . Anxiety   . Arthritis    neck  . Asthma    uses inhalers every day  . Cancer ([place])    bladder  . Cutaneous lupus erythematosus   . 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 History of anemia   . Severe episode of recurrent major depressive disorder, without psychotic features ([place]) 01/07/2020  . Sleep apnea    mild  . SUI (stress urinary incontinence, female)     Past Surgical History:  Procedure Laterality Date  . BLADDER SUSPENSION N/A 02/13/2013   Procedure: [facility] SLING;  Surgeon: Malka So, MD;  Location: [facility];  Service: Urology;  Laterality: N/A;  . CESAREAN SECTION  12-23-2004    W/ LEFT TUBAL LIGATION AND RIGHT SALPINGECTOMY  . CESAREAN SECTION  1995  . CYSTOSCOPY WITH STENT PLACEMENT Bilateral 06/16/2020   Procedure: CYSTOSCOPY WITH FIREFLY INJECTION;  Surgeon: Lucas Mallow, MD;  Location: [facility];  Service: Urology;  Laterality: Bilateral;  . IR RADIOLOGIST EVAL & MGMT  04/21/2020  . IR RADIOLOGIST EVAL & MGMT  05/12/2020  . ORIF HUMERUS FRACTURE Right 04/28/2014   Procedure: OPEN REDUCTION INTERNAL FIXATION (ORIF) PROXIMAL HUMERUS FRACTURE;  Surgeon: Nita Sells, MD;  Location: [place];  Service: Orthopedics;  Laterality: Right;  . ORIF PROXIMAL HUMERUS FRACTURE Right 04/28/2014   DR CHANDLER  . PROCTOSCOPY N/A 06/16/2020   Procedure: RIGID PROCTOSCOPY;  Surgeon: Michael Boston, MD;  Location: [facility];  Service: General;  Laterality: N/A;  . TUBAL LIGATION     Family History:  Family History  Adopted: Yes   Family Psychiatric  History: Adopted does not know Social  History:  Social History   Substance and Sexual Activity  Alcohol Use Yes  . Alcohol/week: 2.0 standard drinks  . Types: 2 Glasses of wine per week   Comment: 12/29/20 restarted      Social History   Substance and Sexual Activity  Drug Use No    Social History   Socioeconomic History  . Marital status: Married    Spouse name: Not on file  . Number of children: Not on file  . Years of education: Not on file  . Highest education level: Not on file  Occupational History  . Not on file  Tobacco Use  . Smoking status: Former Smoker    Packs/day: 0.25    Years: 20.00    Pack years: 5.00    Types: Cigarettes    Quit date: 09/24/2019    Years since quitting: 1.2  . Smokeless tobacco: Never Used  Vaping Use  . Vaping Use: Every day  Substance and Sexual Activity  . Alcohol use: Yes    Alcohol/week: 2.0 standard drinks    Types: 2 Glasses of wine per week    Comment: 12/29/20 restarted   . Drug use: No  . Sexual activity: Not Currently    Birth control/protection: Post-menopausal  Other Topics Concern  . Not on file  Social History Narrative  . Not on file   Social Determinants of Health   Financial Resource Strain: Not on file  Food Insecurity: Not on file  Transportation Needs: Not on file  Physical Activity: Not on file  Stress: Not on file  Social Connections: Not on file   Additional Social History:                         Sleep: Good  Appetite:  Poor  Current Medications: Current Facility-Administered Medications  Medication Dose Route Frequency Provider Last Rate Last Admin  . acetaminophen (TYLENOL) tablet 650 mg  650 mg Oral Q6H PRN Connye Burkitt, NP      . albuterol (VENTOLIN HFA) 108 (90 Base) MCG/ACT inhaler 1-2 puff  1-2 puff Inhalation Q6H PRN Briant Cedar, MD   2 puff at 01/06/21 1258  . alum & mag hydroxide-simeth (MAALOX/MYLANTA) 200-200-20 MG/5ML suspension 30 mL  30 mL Oral Q4H PRN Connye Burkitt, NP      . budesonide  (PULMICORT) nebulizer solution 0.5 mg  0.5 mg Nebulization Daily PRN Connye Burkitt, NP      . cloNIDine (CATAPRES) tablet 0.1 mg  0.1 mg Oral QID Connye Burkitt, NP   0.1 mg at 01/07/21 2157  . feeding supplement (ENSURE ENLIVE / ENSURE PLUS) liquid 237 mL  237 mL Oral BID BM Lindon Romp A, NP   237 mL at 01/07/21 1412  . FLUoxetine (PROZAC) capsule 60 mg  60 mg Oral Daily Connye Burkitt, NP   60 mg at 01/07/21 [id number]  . fluticasone furoate-vilanterol (BREO ELLIPTA) 100-25 MCG/INH 1 puff  1 puff Inhalation Daily Connye Burkitt, NP   1 puff at 01/07/21 0827  .  gabapentin (NEURONTIN) capsule 300 mg  300 mg Oral TID Viann Fish E, MD   300 mg at 01/07/21 1705   And  . gabapentin (NEURONTIN) capsule 900 mg  900 mg Oral QHS Nelda Marseille, Amy E, MD   900 mg at 01/07/21 2157  . hydrOXYzine (ATARAX/VISTARIL) tablet 25 mg  25 mg Oral Q6H PRN Connye Burkitt, NP      . loperamide (IMODIUM) capsule 2-4 mg  2-4 mg Oral PRN Connye Burkitt, NP      . LORazepam (ATIVAN) tablet 1 mg  1 mg Oral Q6H PRN Harlow Asa, MD       Or  . LORazepam (ATIVAN) injection 1 mg  1 mg Intramuscular Q6H PRN Nelda Marseille, Amy E, MD      . LORazepam (ATIVAN) tablet 1 mg  1 mg Oral Q6H PRN Connye Burkitt, NP   1 mg at 01/06/21 1538  . magnesium hydroxide (MILK OF MAGNESIA) suspension 30 mL  30 mL Oral Daily PRN Connye Burkitt, NP      . multivitamin with minerals tablet 1 tablet  1 tablet Oral Daily Connye Burkitt, NP   1 tablet at 01/07/21 [phone]  . OLANZapine zydis (ZYPREXA) disintegrating tablet 10 mg  10 mg Oral Q8H PRN Connye Burkitt, NP       And  . ziprasidone (GEODON) injection 20 mg  20 mg Intramuscular PRN Connye Burkitt, NP      . ondansetron (ZOFRAN-ODT) disintegrating tablet 4 mg  4 mg Oral Q6H PRN Connye Burkitt, NP      . progesterone (PROMETRIUM) capsule 200 mg  200 mg Oral Daily Connye Burkitt, NP   200 mg at 01/07/21 7124  . propranolol (INDERAL) tablet 10 mg  10 mg Oral BID PRN Connye Burkitt, NP      .  SUMAtriptan (IMITREX) tablet 100 mg  100 mg Oral Q2H PRN Briant Cedar, MD   100 mg at 01/06/21 1532  . thiamine tablet 100 mg  100 mg Oral Daily Connye Burkitt, NP   100 mg at 01/07/21 5809  . traZODone (DESYREL) tablet 100 mg  100 mg Oral QHS PRN Connye Burkitt, NP   100 mg at 01/07/21 2158  . umeclidinium bromide (INCRUSE ELLIPTA) 62.5 MCG/INH 1 puff  1 puff Inhalation Daily Connye Burkitt, NP   1 puff at 01/07/21 9833    Lab Results:  Results for orders placed or performed during the hospital encounter of 01/05/21 (from the past 48 hour(s))  Hemoglobin A1c     Status: None   Collection Time: 01/06/21  6:19 PM  Result Value Ref Range   Hgb A1c MFr Bld 5.6 4.8 - 5.6 %    Comment: (NOTE) Pre diabetes:          5.7%-6.4%  Diabetes:              >6.4%  Glycemic control for   <7.0% adults with diabetes    Mean Plasma Glucose 114.02 mg/dL    Comment: Performed at [facility] [address], [place], [place] [address]  Lipid panel     Status: None   Collection Time: 01/06/21  6:19 PM  Result Value Ref Range   Cholesterol 157 0 - 200 mg/dL   Triglycerides 76 <150 mg/dL   HDL 60 >40 mg/dL   Total CHOL/HDL Ratio 2.6 RATIO   VLDL 15 0 - 40 mg/dL   LDL Cholesterol 82  0 - 99 mg/dL    Comment:        Total Cholesterol/HDL:CHD Risk Coronary Heart Disease Risk Table                     Men   Women  1/2 Average Risk   3.4   3.3  Average Risk       5.0   4.4  2 X Average Risk   9.6   7.1  3 X Average Risk  23.4   11.0        Use the calculated Patient Ratio above and the CHD Risk Table to determine the patient's CHD Risk.        ATP III CLASSIFICATION (LDL):  <100     mg/dL   Optimal  100-129  mg/dL   Near or Above                    Optimal  130-159  mg/dL   Borderline  160-189  mg/dL   High  >190     mg/dL   Very High Performed at [facility] [address] [place], [place] [address]   TSH     Status: None   Collection Time: 01/06/21   6:19 PM  Result Value Ref Range   TSH 0.472 0.350 - 4.500 uIU/mL    Comment: Performed by a 3rd Generation assay with a functional sensitivity of <=0.01 uIU/mL. Performed at [facility], [place] [address] [place], [place] [address]   Urine rapid drug screen (hosp performed)not at [facility]     Status: Abnormal   Collection Time: 01/07/21  5:26 AM  Result Value Ref Range   Opiates NONE DETECTED NONE DETECTED   Cocaine NONE DETECTED NONE DETECTED   Benzodiazepines POSITIVE (A) NONE DETECTED   Amphetamines NONE DETECTED NONE DETECTED   Tetrahydrocannabinol NONE DETECTED NONE DETECTED   Barbiturates NONE DETECTED NONE DETECTED    Comment: (NOTE) DRUG SCREEN FOR MEDICAL PURPOSES ONLY.  IF CONFIRMATION IS NEEDED FOR ANY PURPOSE, NOTIFY LAB WITHIN 5 DAYS.  LOWEST DETECTABLE LIMITS FOR URINE DRUG SCREEN Drug Class                     Cutoff (ng/mL) Amphetamine and metabolites    1000 Barbiturate and metabolites    200 Benzodiazepine                 [id number] Tricyclics and metabolites     300 Opiates and metabolites        300 Cocaine and metabolites        300 THC                            50 Performed at [facility], [place] [address], [place], [place] [address]   T4, free     Status: None   Collection Time: 01/07/21  6:20 AM  Result Value Ref Range   Free T4 0.73 0.61 - 1.12 ng/dL    Comment: (NOTE) Biotin ingestion may interfere with free T4 tests. If the results are inconsistent with the TSH level, previous test results, or the clinical presentation, then consider biotin interference. If needed, order repeat testing after stopping biotin. Performed at [facility], [place] [address] [place],  [address]     Blood Alcohol level:  Lab Results  Component Value Date   ETH 168 (H)  01/04/2021   ETH 288 (H) 31/51/7616    Metabolic Disorder Labs: Lab Results  Component Value Date   HGBA1C 5.6 01/06/2021   MPG 114.02 01/06/2021   MPG  119.76 06/08/2020   No results found for: PROLACTIN Lab Results  Component Value Date   CHOL 157 01/06/2021   TRIG 76 01/06/2021   HDL 60 01/06/2021   CHOLHDL 2.6 01/06/2021   VLDL 15 01/06/2021   LDLCALC 82 01/06/2021    Physical Findings: AIMS:  , ,  ,  ,    CIWA:  CIWA-Ar Total: 0 COWS:     Musculoskeletal: Strength & Muscle Tone: within normal limits Gait & Station: normal Patient leans: N/A  Psychiatric Specialty Exam: Physical Exam  Review of Systems  Blood pressure 100/70, pulse 71, temperature 98.1 F (36.7 C), resp. rate 16, height 5\' 5"  (1.651 m), weight 72.1 kg, SpO2 100 %.Body mass index is 26.46 kg/m.  General Appearance: Disheveled  Eye Contact:  Minimal  Speech:  Garbled and Normal Rate  Volume:  Decreased  Mood:  Depressed  Affect:  Depressed  Thought Process:  Coherent  Orientation:  Full (Time, Place, and Person)  Thought Content:  Logical  Suicidal Thoughts:  No  Homicidal Thoughts:  No  Memory:  Immediate;   Fair Recent;   Fair  Judgement:  Intact  Insight:  Present  Psychomotor Activity:  Normal  Concentration:  Concentration: Fair and Attention Span: Fair  Recall:  [employer] of Knowledge:  Good  Language:  Good  Akathisia:  No  Handed:  Right  AIMS (if indicated):     Assets:  Resilience  ADL's:  Intact  Cognition:  WNL  Sleep:  Number of Hours: 7     Treatment Plan Summary: Daily contact with patient to assess and evaluate symptoms and progress in treatment  Mrs. Logiudice is a 55 yr old female who presents withreported suicideattempt by ingestion (1 Bahama Mama,6 to 8 ounces of Pine-Sol, 5-6 tablets 25 mgHydroxyzine, and 3 tablets of0.1 mg clonidine)which patient denies.PPHx is significant for MDD, Recurrent, Severe, Anxiety, EtOH use Disorder, 2 prior admissions to [facility] ([phone]), 2 previous stays at [facility] ([phone]), and 1 previous suicide attempt.   She was more calm today and seemed remorseful about her  outburst yesterday. She was forward thinking by asking about housing once she discharges. Given her dizziness will decrease her Clonidine to TID and encourage her to eat and drink more. Will continue to monitor.   Unspecified Depressive Disorder: -Continue Prozac 60 mg daily   Alcohol Use Disorder: -Continue Gabapentin 300 mg TID and 900mg  qhs -CIWA -Start Ativan 1 mg PRN CIWA>10 -Continue thiamine 100 mg daily   Asthma  possible COPD: -Start Albuterol 1-2 puffs q6 PRN -Continue Incruse Ellipta 1 puff daily -Continue Breo Ellipta 1 puff daily -Continue Budesonide 0.5 mg daily PRN SOB   -Decrease Clonidine 0.1 mg to TID  -Continue Ensure BID -Continue Zofran ODT 4 mg q6 PRN -Continue Agitation Protocol Zyprexa 10 mg/Ativan 1 mg/ Geodon 20 mg IM -Continue Propranolol 10 mg daily -Continue multivitamin daily -Continue Liothyronine 10 mcg daily -Continue PRN's: Tylenol, Maalox, Atarax, Milk of Magnesia, Trazodone   Briant Cedar, MD 01/08/2021, 7:21 AM

## 2021-01-08 NOTE — BHH Group Notes (Signed)
Patient did not attend AA group. 

## 2021-01-08 NOTE — BHH Group Notes (Signed)
LCSW Group Therapy Note  01/08/2021   10:00-11:00am   Topic:  Anger Triggers and Coping Skills  Participation Level:  Did Not Attend  Description of Group:   In this group, patients learned how to recognize the physical, cognitive, emotional, and behavioral responses they have to anger-provoking situations.  They identified their own common triggers and typical reactions then analyzed how these reactions are possibly beneficial and possibly unhelpful.  Focus was placed on how helpful it is to recognize the underlying emotions to anger in order to address these for more permanent resolution.  Emphasis was also on identifying possible replacement thoughts for the automatic thoughts generated in various situations shared by the group.  Therapeutic Goals: 1. Patients will share situations that commonly incite their anger and how they typically respond 2. Patients will identify how their coping skills work for them and/or against them 3. Patients will explore possible alternative thoughts to their automatic ones 4. Patients will learn that anger itself is normal and that healthier reactions can assist with resolving conflict rather than worsening situations  Summary of Patient Progress:  The patient was invited to group, opted not to attend.   Therapeutic Modalities:   Cognitive Behavioral Therapy Processing  Maretta Los

## 2021-01-08 NOTE — Progress Notes (Signed)
   01/07/21 2200  Psych Admission Type (Psych Patients Only)  Admission Status Involuntary  Psychosocial Assessment  Patient Complaints Other (Comment) (c/o belongings sent in by her husband)  Eye Contact Avoids;Avertive  Facial Expression Anxious  Affect Anxious;Blunted  Speech Logical/coherent;Argumentative  Interaction Assertive  Motor Activity Slow  Appearance/Hygiene In scrubs  Behavior Characteristics Appropriate to situation;Anxious;Irritable  Mood Anxious;Irritable;Preoccupied  Aggressive Behavior  Targets Family  Type of Behavior Striking out;Verbal;Unprovoked;Provoked or triggered;Rage  Effect Property damaged  Thought Process  Coherency WDL  Content WDL  Delusions None reported or observed  Perception WDL  Hallucination None reported or observed  Judgment Impaired  Confusion None  Danger to Self  Current suicidal ideation? Denies  Danger to Others  Danger to Others None reported or observed

## 2021-01-08 NOTE — Progress Notes (Signed)
   01/08/21 2339  Psych Admission Type (Psych Patients Only)  Admission Status Involuntary  Psychosocial Assessment  Patient Complaints Anxiety  Eye Contact Brief  Facial Expression Anxious  Affect Appropriate to circumstance  Speech Logical/coherent  Interaction Minimal  Motor Activity Slow  Appearance/Hygiene In scrubs  Behavior Characteristics Appropriate to situation  Mood Anxious  Thought Process  Coherency WDL  Content WDL  Delusions None reported or observed  Perception WDL  Hallucination None reported or observed  Judgment Impaired  Confusion None  Danger to Self  Current suicidal ideation? Denies  Danger to Others  Danger to Others None reported or observed

## 2021-01-08 NOTE — Progress Notes (Signed)
   01/08/21 2336  COVID-19 Daily Checkoff  Have you had a fever (temp > 37.80C/100F)  in the past 24 hours?  No  If you have had runny nose, nasal congestion, sneezing in the past 24 hours, has it worsened? No  COVID-19 EXPOSURE  Have you traveled outside the state in the past 14 days? No  Have you been in contact with someone with a confirmed diagnosis of COVID-19 or PUI in the past 14 days without wearing appropriate PPE? No  Have you been living in the same home as a person with confirmed diagnosis of COVID-19 or a PUI (household contact)? No  Have you been diagnosed with COVID-19? No

## 2021-01-08 NOTE — Progress Notes (Signed)
Psychoeducational Group Note  Date:  01/08/2021 Time: 2015  Group Topic/Focus:  wrap up group  Participation Level: Did Not Attend  Participation Quality:  Not Applicable  Affect:  Not Applicable  Cognitive:  Not Applicable  Insight:  Not Applicable  Engagement in Group: Not Applicable  Additional Comments:  Pt remained in bed during group time.   Shellia Cleverly 01/08/2021, 8:39 PM

## 2021-01-09 DIAGNOSIS — F1994 Other psychoactive substance use, unspecified with psychoactive substance-induced mood disorder: Secondary | ICD-10-CM | POA: Diagnosis not present

## 2021-01-09 DIAGNOSIS — Z7289 Other problems related to lifestyle: Secondary | ICD-10-CM | POA: Diagnosis not present

## 2021-01-09 DIAGNOSIS — F32A Depression, unspecified: Secondary | ICD-10-CM | POA: Diagnosis not present

## 2021-01-09 MED ORDER — BUSPIRONE HCL 5 MG PO TABS
5.0000 mg | ORAL_TABLET | Freq: Three times a day (TID) | ORAL | Status: DC
  Administered 2021-01-09 – 2021-01-11 (×6): 5 mg via ORAL
  Filled 2021-01-09 (×13): qty 1

## 2021-01-09 NOTE — BHH Group Notes (Signed)
Psychoeducational Group Note  Date: 01-09-21 Time:  1300  Group Topic/Focus:  Making Healthy Choices:   The focus of this group is to help patients identify negative/unhealthy choices they were using prior to admission and identify positive/healthier coping strategies to replace them upon discharge.In this group, patients started asking about the brain and how the brain works with and how the chemicals work for those who use substances, the pros and cons of saboxone.  Participation Level:  Active  Participation Quality:  Appropriate  Affect:  Appropriate  Cognitive:  Oriented  Insight:  Improving  Engagement in Group:  Engaged  Additional Comments: Engaged and invested in the group  [name], [place] A

## 2021-01-09 NOTE — BHH Group Notes (Signed)
Adult Psychoeducational Group Not Date:  01/09/2021 Time:  [id number] Group Topic/Focus: PROGRESSIVE RELAXATION. A group where deep breathing is taught and tensing and relaxation muscle groups is used. Imagery is used as well.  Pts are asked to imagine 3 pillars that hold them up when they are not able to hold themselves up.  Participation Level:  Active  Participation Quality:  Appropriate  Affect:  Appropriate  Cognitive:  Oriented  Insight: Improving  Engagement in Group:  Engaged  Modes of Intervention:  Activity, Discussion, Education, and Support  Additional Comments:  Rates her energy at a 10/10. States she doesn't have anything or anyone to hold her up. Her saying and what she has learned is one step at a time.  Paulino Rily

## 2021-01-09 NOTE — Progress Notes (Signed)
[facility] MD Progress Note  01/09/2021 8:03 PM [name]  MRN:  [id number]   Subjective: "I feel a little better today." [name] is a 55 yr old female who presents withreported suicideattempt by ingestion (1 Bahama Mama,6 to 8 ounces of Pine-Sol, 5-6 tablets 25 mgHydroxyzine, and 3 tablets of0.1 mg clonidine)which patient denies.PPHx is significant for MDD, Recurrent, Severe, Anxiety, EtOH use Disorder, 2 prior admissions to [facility] [id number]), 2 previous stays at [facility] [phone]), and 1 previous suicide attempt.   Patient is seen, chart is reviewed.  Patient is seen in her room.  She arouses easily, is dressed nicely, has showered and smiles.  She reports that she feels much better today.  She reports improved appetite, however still having difficulty sleeping.  She does endorse anxiety today and requests buspirone.  She was able to attend to groups with active participation.  She is requesting housing options for work, even though she is able to verbalize that she can move back to her home and her husband will move out.  She states that they will need to sell her home, and that they will split the money.  She recognizes that she probably needs financial advice, as well as figuring out where to live afterwards.  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  She does recall that she has had maintained sobriety in the past.  She has been provided with homework to make a list of things with which she has been successful before.  She is also given an assignment to think about how to manage her finances and create a starting plan.  Patient feels as if she is able to work on these today.  She is denying any suicidal or homicidal ideation.  She is denying any auditory or visual hallucinations today.   Principal Problem: Depression, unspecified Diagnosis: Principal Problem:   Depression, unspecified Active Problems:   Alcohol  use  Total Time spent with patient: 35 minutes  Past Psychiatric History: MDD, Recurrent, Severe, Anxiety, EtOH use Disorder, 2 prior admissions to [facility] [id number]), 2 previous stays at [facility] [phone]), and 1 previous suicide attempt.  Past Medical History:  Past Medical History:  Diagnosis Date  . ADHD (attention deficit hyperactivity disorder)   . Alcohol dependence with acute alcoholic intoxication with complication ([place]) 0000000  . Anemia 1990s  . Anxiety   . Arthritis    neck  . Asthma    uses inhalers every day  . Cancer ([place])    bladder  . Cutaneous lupus erythematosus   . 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 History of anemia   . Severe episode of recurrent major depressive disorder, without psychotic features ([place]) 01/07/2020  . Sleep apnea    mild  . SUI (stress urinary incontinence, female)     Past Surgical History:  Procedure Laterality Date  . BLADDER SUSPENSION N/A 02/13/2013   Procedure: [facility] SLING;  Surgeon: Malka So, MD;  Location[facility];  Service: Urology;  Laterality: N/A;  . CESAREAN SECTION  12-23-2004    W/ LEFT TUBAL LIGATION AND RIGHT SALPINGECTOMY  . CESAREAN SECTION  1995  . CYSTOSCOPY WITH STENT PLACEMENT Bilateral 06/16/2020   Procedure: CYSTOSCOPY WITH FIREFLY INJECTION;  Surgeon: Lucas Mallow, MD;  Location: [facility];  Service: Urology;  Laterality: Bilateral;  . IR RADIOLOGIST EVAL & MGMT  04/21/2020  . IR RADIOLOGIST  EVAL & MGMT  05/12/2020  . ORIF HUMERUS FRACTURE Right 04/28/2014   Procedure: OPEN REDUCTION INTERNAL FIXATION (ORIF) PROXIMAL HUMERUS FRACTURE;  Surgeon: Mable Paris, MD;  Location[facility] OR;  Service: Orthopedics;  Laterality: Right;  . ORIF PROXIMAL HUMERUS FRACTURE Right 04/28/2014   DR CHANDLER  . PROCTOSCOPY N/A 06/16/2020   Procedure: RIGID PROCTOSCOPY;  Surgeon: Karie Soda, MD;  Location: [facility];  Service: General;  Laterality: N/A;  . TUBAL LIGATION      Family History:  Family History  Adopted: Yes   Family Psychiatric  History: Adopted does not know Social History:  Social History   Substance and Sexual Activity  Alcohol Use Yes  . Alcohol/week: 2.0 standard drinks  . Types: 2 Glasses of wine per week   Comment: 12/29/20 restarted      Social History   Substance and Sexual Activity  Drug Use No    Social History   Socioeconomic History  . Marital status: Married    Spouse name: Not on file  . Number of children: Not on file  . Years of education: Not on file  . Highest education level: Not on file  Occupational History  . Not on file  Tobacco Use  . Smoking status: Former Smoker    Packs/day: 0.25    Years: 20.00    Pack years: 5.00    Types: Cigarettes    Quit date: 09/24/2019    Years since quitting: 1.2  . Smokeless tobacco: Never Used  Vaping Use  . Vaping Use: Every day  Substance and Sexual Activity  . Alcohol use: Yes    Alcohol/week: 2.0 standard drinks    Types: 2 Glasses of wine per week    Comment: 12/29/20 restarted   . Drug use: No  . Sexual activity: Not Currently    Birth control/protection: Post-menopausal  Other Topics Concern  . Not on file  Social History Narrative  . Not on file   Social Determinants of Health   Financial Resource Strain: Not on file  Food Insecurity: Not on file  Transportation Needs: Not on file  Physical Activity: Not on file  Stress: Not on file  Social Connections: Not on file   Additional Social History:                         Sleep: Decreased  Appetite:  Fair, improving  Current Medications: Current Facility-Administered Medications  Medication Dose Route Frequency Provider Last Rate Last Admin  . acetaminophen (TYLENOL) tablet 650 mg  650 mg Oral Q6H PRN Aldean Baker, NP      . albuterol (VENTOLIN HFA) 108 (90 Base) MCG/ACT inhaler 1-2 puff  1-2 puff Inhalation Q6H PRN Lauro Franklin, MD   2 puff at 01/08/21 [phone]  . alum &  mag hydroxide-simeth (MAALOX/MYLANTA) 200-200-20 MG/5ML suspension 30 mL  30 mL Oral Q4H PRN Aldean Baker, NP      . budesonide (PULMICORT) nebulizer solution 0.5 mg  0.5 mg Nebulization Daily PRN Aldean Baker, NP      . busPIRone (BUSPAR) tablet 5 mg  5 mg Oral TID Mariel Craft, MD   5 mg at 01/09/21 1639  . cloNIDine (CATAPRES) tablet 0.1 mg  0.1 mg Oral TID Lauro Franklin, MD   0.1 mg at 01/09/21 1639  . feeding supplement (ENSURE ENLIVE / ENSURE PLUS) liquid 237 mL  237 mL Oral BID BM Jackelyn Poling, NP  237 mL at 01/09/21 1441  . FLUoxetine (PROZAC) capsule 60 mg  60 mg Oral Daily Connye Burkitt, NP   60 mg at 01/09/21 0810  . fluticasone furoate-vilanterol (BREO ELLIPTA) 100-25 MCG/INH 1 puff  1 puff Inhalation Daily Connye Burkitt, NP   1 puff at 01/09/21 0810  . gabapentin (NEURONTIN) capsule 300 mg  300 mg Oral TID Viann Fish E, MD   300 mg at 01/09/21 1639   And  . gabapentin (NEURONTIN) capsule 900 mg  900 mg Oral QHS Harlow Asa, MD   900 mg at 01/07/21 2157  . LORazepam (ATIVAN) tablet 1 mg  1 mg Oral Q6H PRN Harlow Asa, MD       Or  . LORazepam (ATIVAN) injection 1 mg  1 mg Intramuscular Q6H PRN Nelda Marseille, Amy E, MD      . magnesium hydroxide (MILK OF MAGNESIA) suspension 30 mL  30 mL Oral Daily PRN Connye Burkitt, NP      . multivitamin with minerals tablet 1 tablet  1 tablet Oral Daily Connye Burkitt, NP   1 tablet at 01/09/21 0810  . OLANZapine zydis (ZYPREXA) disintegrating tablet 10 mg  10 mg Oral Q8H PRN Connye Burkitt, NP       And  . ziprasidone (GEODON) injection 20 mg  20 mg Intramuscular PRN Connye Burkitt, NP      . progesterone (PROMETRIUM) capsule 200 mg  200 mg Oral Daily Connye Burkitt, NP   200 mg at 01/09/21 0810  . propranolol (INDERAL) tablet 10 mg  10 mg Oral BID PRN Connye Burkitt, NP      . SUMAtriptan (IMITREX) tablet 100 mg  100 mg Oral Q2H PRN Briant Cedar, MD   100 mg at 01/06/21 1532  . thiamine tablet 100 mg  100  mg Oral Daily Connye Burkitt, NP   100 mg at 01/09/21 0810  . traZODone (DESYREL) tablet 100 mg  100 mg Oral QHS PRN Connye Burkitt, NP   100 mg at 01/07/21 2158  . umeclidinium bromide (INCRUSE ELLIPTA) 62.5 MCG/INH 1 puff  1 puff Inhalation Daily Connye Burkitt, NP   1 puff at 01/09/21 1062    Lab Results:  No results found for this or any previous visit (from the past 48 hour(s)).  Blood Alcohol level:  Lab Results  Component Value Date   ETH 168 (H) 01/04/2021   ETH 288 (H) 69/48/5462    Metabolic Disorder Labs: Lab Results  Component Value Date   HGBA1C 5.6 01/06/2021   MPG 114.02 01/06/2021   MPG 119.76 06/08/2020   No results found for: PROLACTIN Lab Results  Component Value Date   CHOL 157 01/06/2021   TRIG 76 01/06/2021   HDL 60 01/06/2021   CHOLHDL 2.6 01/06/2021   VLDL 15 01/06/2021   LDLCALC 82 01/06/2021    Physical Findings: AIMS: Facial and Oral Movements Muscles of Facial Expression: None, normal Lips and Perioral Area: None, normal Jaw: None, normal Tongue: None, normal,Extremity Movements Upper (arms, wrists, hands, fingers): None, normal Lower (legs, knees, ankles, toes): None, normal, Trunk Movements Neck, shoulders, hips: None, normal, Overall Severity Severity of abnormal movements (highest score from questions above): None, normal Incapacitation due to abnormal movements: None, normal Patient's awareness of abnormal movements (rate only patient's report): No Awareness, Dental Status Current problems with teeth and/or dentures?: No Does patient usually wear dentures?: No  CIWA:  CIWA-Ar Total: 0 COWS:  Musculoskeletal: Strength & Muscle Tone: within normal limits Gait & Station: normal Patient leans: N/A  Psychiatric Specialty Exam: Physical Exam Vitals and nursing note reviewed.  Constitutional:      Appearance: Normal appearance.  HENT:     Head: Normocephalic and atraumatic.  Eyes:     Extraocular Movements: Extraocular  movements intact.  Cardiovascular:     Rate and Rhythm: Tachycardia present.  Pulmonary:     Effort: Pulmonary effort is normal. No respiratory distress.     Breath sounds: No wheezing.  Musculoskeletal:        General: Normal range of motion.     Cervical back: Normal range of motion.  Neurological:     General: No focal deficit present.     Mental Status: She is alert and oriented to person, place, and time.     Review of Systems  Constitutional: Negative.   HENT: Negative.   Respiratory: Negative.   Cardiovascular: Negative.   Gastrointestinal: Negative.   Musculoskeletal: Negative.   Neurological: Negative.   Psychiatric/Behavioral: Positive for dysphoric mood and sleep disturbance. Negative for agitation, behavioral problems, confusion, decreased concentration, hallucinations, self-injury and suicidal ideas. The patient is nervous/anxious. The patient is not hyperactive.     Blood pressure 109/69, pulse (!) 104, temperature 98.2 F (36.8 C), temperature source Oral, resp. rate 18, height 5\' 5"  (1.651 m), weight 72.1 kg, SpO2 100 %.Body mass index is 26.46 kg/m.  General Appearance: Casual and Fairly Groomed  Eye Contact:  Good  Speech:  Clear and Coherent and Normal Rate  Volume:  Normal  Mood:  Anxious  Affect:  Congruent  Thought Process:  Coherent  Orientation:  Full (Time, Place, and Person)  Thought Content:  Logical and Hallucinations: None  Suicidal Thoughts:  No  Homicidal Thoughts:  No  Memory:  Immediate;   Fair Recent;   Fair  Judgement:  Intact  Insight:  Present  Psychomotor Activity:  Normal  Concentration:  Concentration: Fair and Attention Span: Fair  Recall:  [employer] of Knowledge:  Good  Language:  Good  Akathisia:  No  Handed:  Right  AIMS (if indicated):     Assets:  Resilience  ADL's:  Intact  Cognition:  WNL  Sleep:  Number of Hours: 4.5     Treatment Plan Summary: Daily contact with patient to assess and evaluate symptoms and  progress in treatment  [name] is a 55 yr old female who presents withreported suicideattempt by ingestion (1 Bahama Mama,6 to 8 ounces of Pine-Sol, 5-6 tablets 25 mgHydroxyzine, and 3 tablets of0.1 mg clonidine)which patient denies.PPHx is significant for MDD, Recurrent, Severe, Anxiety, EtOH use Disorder, 2 prior admissions to [facility] [id number]), 2 previous stays at [facility] [phone]), and 1 previous suicide attempt.  Patient has been out of bed today, showered, interacting with peers and attending groups.  She has been given some homework assignments which should be checked on tomorrow.  She is actively engaged in treatment at this time and looking forward towards discharge planning.  Buspirone 5 mg 3 times daily was added for anxiety.  We will continue to monitor for effectiveness.     Unspecified Depressive Disorder: -Continue Prozac 60 mg daily for depression and anxiety -Start buspirone 5 mg 3 times daily for anxiety  Alcohol Use Disorder: -Continue Gabapentin 300 mg TID and 900mg  qhs -CIWA -Start Ativan 1 mg PRN CIWA>10 -Continue thiamine 100 mg daily   Asthma  possible COPD: -Start Albuterol 1-2 puffs q6 PRN -Continue  Increase Ellipta 1 puff daily -Continue Breo Ellipta 1 puff daily -Continue Budesonide 0.5 mg daily PRN SOB   -Decrease Clonidine 0.1 mg to TID  -Continue Ensure BID -Continue Zofran ODT 4 mg q6 PRN -Continue Agitation Protocol Zyprexa 10 mg/Ativan 1 mg/ Geodon 20 mg IM -Continue Propranolol 10 mg daily -Continue multivitamin daily -Continue Liothyronine 10 mcg daily -Continue PRN's: Tylenol, Maalox, Atarax, Milk of Magnesia, Trazodone   Lavella Hammock, MD 01/09/2021, 8:03 PM

## 2021-01-09 NOTE — Progress Notes (Signed)
D.  Pt pleasant on approach, did not get up to attend evening wrap up group.  Pt did get up later for medication and explained that she is still experiencing some dizziness when she gets up.  Pt requested and received Trazodone for sleep as ordered.  A.  Support and encouragement offered, medications given as ordered.  Reinforced fall prevention information due to report of dizziness.    A.  Pt verbalized understanding, remains safe on the unit.  Will continue to monitor.

## 2021-01-09 NOTE — BHH Group Notes (Signed)
Psychoeducational Group Note Late entry for 01/08/2021   Date:01/08/2021 Time: 1300-1400    Life Skills:  A group where two lists are made. What people need and what are things that we do that are healthy. The lists are developed by the patients and it is explained that we often do the actions that are not healthy to get our list of needs met.   Purpose of Group: . The group focus' on teaching patients on how to identify their needs and how to develop the coping skills needed to get their needs met  Participation Level:  Did not attend  PJudge, Elnora Morrison

## 2021-01-09 NOTE — Progress Notes (Signed)
Psychoeducational Group Note  Date:  01/09/2021 Time:  2030  Group Topic/Focus:  wrap up group  Participation Level: Did Not Attend  Participation Quality:  Not Applicable  Affect:  Not Applicable  Cognitive:  Not Applicable  Insight:  Not Applicable  Engagement in Group: Not Applicable  Additional Comments:  Pt was in bed during group time.  Shellia Cleverly 01/09/2021, 8:40 PM

## 2021-01-09 NOTE — Progress Notes (Signed)
Patient presents with a flat affect and depressed mood. She reports decreased depression and anxiety today. She denies SI/HI. She stated that she needs to find a place to stay because she left her husband last week and doesn't have anywhere to go. She reported poor sleep last night, "waking up throughout the night." She's been compliant today with attending scheduled groups.   Orders reviewed. Vital signs reviewed. Verbal support provided. 15 minute checks performed for safety.   Patient compliant with treatment plan.

## 2021-01-09 NOTE — BHH Group Notes (Signed)
[place] LCSW Group Therapy Note  01/09/2021   10:00-11:00AM  Group Therapy:  Unhealthy versus Healthy Supports, Which Am I?  Participation Level:  Active   Description of Group:  Patients in this group were introduced to the concept that additional supports including self-support are an essential part of recovery.  Initially a discussion was held about the differences between healthy versus unhealthy supports.  Group then talked about adding a variety of healthy supports to address the various needs in patient lives, including becoming more self-supportive.   Two songs entitled "Love Myself" and "My Own Hero" were played.  A group discussion ensued in which patients stated they could relate to the songs which inspired them to be more willing to help themselves in order to succeed, because other people cannot achieve sobriety or stability for them.  Several patients were tearful as they discussed the difficulty in loving oneself.  A song was played called "I Am Enough " toward the end of group and used to conduct an inspirational wrap-up to group of believing in their own self-worth.  They were asked to name one way in which they are willing to work on improving their own self-support at discharge.  Therapeutic Goals: 1)  Highlight the differences between healthy and unhealthy supports 2)  Suggest the importance of being a part of one's own support system 2)  Listen to songs which can be an encouragement on an ongoing basis  3)  Identify ways in which the patient can improve attitudes toward self and willingness to help self   4) Elicit commitments to add healthy supports and to become more conscious of being self-supportive   Summary of Patient Progress:  The patient stated she feels she is an inadequate support for herself.  The patient participated fully throughout group and listened attentively to everything said by others.  The patient stated that one action she is willing to take at discharge in  order to become a "hero"/better self-support is to focus on realizing that she is "enough," to remain positive, to listen to positive songs to remind, and to stay in counseling.  She added that even though she does not feel that alcohol is really a problem for her like it has been in the past, she thinks it would be a positive step for her to return to Alcoholics Anonymous meetings and reconnect with her sponsor.  She received much encouragement to pursue this suggestion.  Therapeutic Modalities:   Motivational Interviewing Activity  Maretta Los , MSW, LCSW

## 2021-01-09 NOTE — Progress Notes (Signed)
   01/09/21 2246  COVID-19 Daily Checkoff  Have you had a fever (temp > 37.80C/100F)  in the past 24 hours?  No  If you have had runny nose, nasal congestion, sneezing in the past 24 hours, has it worsened? No  COVID-19 EXPOSURE  Have you traveled outside the state in the past 14 days? No  Have you been in contact with someone with a confirmed diagnosis of COVID-19 or PUI in the past 14 days without wearing appropriate PPE? No  Have you been living in the same home as a person with confirmed diagnosis of COVID-19 or a PUI (household contact)? No  Have you been diagnosed with COVID-19? No

## 2021-01-10 NOTE — BHH Group Notes (Signed)
Occupational Therapy Group Note Date: 01/10/2021 Group Topic/Focus: Self-Esteem  Group Description: Group encouraged increased engagement and participation through discussion and activity focused on self-esteem. Patients explored and discussed the differences between healthy and low self-esteem and how it affects our daily lives and occupations with a focus on relationships, work, school, self-care, and personal leisure interests. Group discussion then transitioned into identifying specific strategies to boost self-esteem and engaged in a collaborative and independent activity looking at positive ways to describe oneself A-Z.   Therapeutic Goal(s): Understand and recognize the differences between healthy and low self-esteem Identify healthy strategies to improve/build self-esteem Participation Level: Patient did not attend OT group session despite personal invitation.     Plan: Continue to engage patient in OT groups 2 - 3x/week.  01/10/2021  Ponciano Ort, MOT, OTR/L

## 2021-01-10 NOTE — BHH Group Notes (Signed)
[place] LCSW Group Therapy  01/10/2021 1:27 PM  Type of Therapy:  Group Therapy: Stress Management  Participation Level:  Active  Summary of Progress/Problems: Pt received the packet for group. Pt shared during introductions that "the fear of the unknown" stresses her out. Pt shared that when she gets stressed she becomes irritable and angry and to reduce stress she listens to music. Pt shared that going to church makes her happy.   Mliss Fritz 01/10/2021, 1:27 PM

## 2021-01-10 NOTE — BHH Counselor (Signed)
CSW spoke with Malayjah who state that her husband has agreed to let her stay in the home until she finds a place or until they are able to sell the home.  Jeidi states that she has been looking at apartments and has called a few places.  Chantale has a folder full of options that she is looking through and calling.  CSW will contact the husband to confirm if Shalayah can come back to the home and will follow up with Kenise to find out if she has found anymore options other then the home she shares with her husband.

## 2021-01-10 NOTE — Progress Notes (Signed)
D:  Patient denied SI and HI, contracts for safety.  Denied A/V hallucinations.   A:  Medications administered per MD orders.  Emotional support and encouragement given patient. R:  Safety maintained with 15 minute checks.  

## 2021-01-10 NOTE — Plan of Care (Signed)
Nurse discussed coping skills, anxiety and depression with  patient.  

## 2021-01-10 NOTE — BHH Counselor (Signed)
CSW spoke with [name]'s husband, [name], who states that she may come back to the home if she chooses to until it is sold.  Mr. Hemmerich states that she owns half of the house and that she can be there but he will not stay there with her.  CSW will continue to work with [name] to further arrange discharge planning.

## 2021-01-10 NOTE — Progress Notes (Addendum)
[facility] MD Progress Note  01/10/2021 7:20 AM [name]  MRN:  [id number] Subjective:   [name] is a 55 yr old female who presents withreported suicideattempt by ingestion (1 Bahama Mama,6 to 8 ounces of Pine-Sol, 5-6 tablets 25 mgHydroxyzine, and 3 tablets of0.1 mg clonidine)which patient denies.PPHx is significant for MDD, Recurrent, Severe, Anxiety, EtOH use Disorder, 2 prior admissions to [facility] [id number]), 2 previous stays at [facility] [phone]), and 1 previous suicide attempt.  She reports that she is doing much better today since eating and drink more. She states she no longer has light headedness or dizziness. She states she has been eating more and drinking more. She reports her sleep has been good. She states that she did an interview with [facility]. She plans to go there via bus that they can provide a ticket for. She states she plans to go to her old house and packs things and then board the bus. She states she will call [facility] and begin arranging things. She reports no SI, HI, or AVH. She reports she has been getting benefit form group therapy.   Principal Problem: Depression, unspecified Diagnosis: Principal Problem:   Depression, unspecified Active Problems:   Alcohol use  Total Time spent with patient: 15 minutes  Past Psychiatric History: MDD, Recurrent, Severe, Anxiety, EtOH use Disorder, 2 prior admissions to [facility] [id number]), 2 previous stays at [facility] [phone]), and 1 previous suicide attempt.  Past Medical History:  Past Medical History:  Diagnosis Date  . ADHD (attention deficit hyperactivity disorder)   . Alcohol dependence with acute alcoholic intoxication with complication ([place]) 0000000  . Anemia 1990s  . Anxiety   . Arthritis    neck  . Asthma    uses inhalers every day  . Cancer ([place])    bladder  . Cutaneous lupus erythematosus   . 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 History of anemia   .  Severe episode of recurrent major depressive disorder, without psychotic features ([place]) 01/07/2020  . Sleep apnea    mild  . SUI (stress urinary incontinence, female)     Past Surgical History:  Procedure Laterality Date  . BLADDER SUSPENSION N/A 02/13/2013   Procedure: [facility] SLING;  Surgeon: Malka So, MD;  Location: [facility];  Service: Urology;  Laterality: N/A;  . CESAREAN SECTION  12-23-2004    W/ LEFT TUBAL LIGATION AND RIGHT SALPINGECTOMY  . CESAREAN SECTION  1995  . CYSTOSCOPY WITH STENT PLACEMENT Bilateral 06/16/2020   Procedure: CYSTOSCOPY WITH FIREFLY INJECTION;  Surgeon: Lucas Mallow, MD;  Location: [facility];  Service: Urology;  Laterality: Bilateral;  . IR RADIOLOGIST EVAL & MGMT  04/21/2020  . IR RADIOLOGIST EVAL & MGMT  05/12/2020  . ORIF HUMERUS FRACTURE Right 04/28/2014   Procedure: OPEN REDUCTION INTERNAL FIXATION (ORIF) PROXIMAL HUMERUS FRACTURE;  Surgeon: Nita Sells, MD;  Location: [facility];  Service: Orthopedics;  Laterality: Right;  . ORIF PROXIMAL HUMERUS FRACTURE Right 04/28/2014   DR CHANDLER  . PROCTOSCOPY N/A 06/16/2020   Procedure: RIGID PROCTOSCOPY;  Surgeon: Michael Boston, MD;  Location: [facility];  Service: General;  Laterality: N/A;  . TUBAL LIGATION     Family History:  Family History  Adopted: Yes   Family Psychiatric  History: Adopted does not know Social History:  Social History   Substance and Sexual Activity  Alcohol Use Yes  . Alcohol/week: 2.0 standard drinks  . Types: 2 Glasses of  wine per week   Comment: 12/29/20 restarted      Social History   Substance and Sexual Activity  Drug Use No    Social History   Socioeconomic History  . Marital status: Married    Spouse name: Not on file  . Number of children: Not on file  . Years of education: Not on file  . Highest education level: Not on file  Occupational History  . Not on file  Tobacco Use  . Smoking status: Former Smoker    Packs/day: 0.25    Years:  20.00    Pack years: 5.00    Types: Cigarettes    Quit date: 09/24/2019    Years since quitting: 1.2  . Smokeless tobacco: Never Used  Vaping Use  . Vaping Use: Every day  Substance and Sexual Activity  . Alcohol use: Yes    Alcohol/week: 2.0 standard drinks    Types: 2 Glasses of wine per week    Comment: 12/29/20 restarted   . Drug use: No  . Sexual activity: Not Currently    Birth control/protection: Post-menopausal  Other Topics Concern  . Not on file  Social History Narrative  . Not on file   Social Determinants of Health   Financial Resource Strain: Not on file  Food Insecurity: Not on file  Transportation Needs: Not on file  Physical Activity: Not on file  Stress: Not on file  Social Connections: Not on file   Additional Social History:                         Sleep: Good  Appetite:  Good  Current Medications: Current Facility-Administered Medications  Medication Dose Route Frequency Provider Last Rate Last Admin  . acetaminophen (TYLENOL) tablet 650 mg  650 mg Oral Q6H PRN Connye Burkitt, NP      . albuterol (VENTOLIN HFA) 108 (90 Base) MCG/ACT inhaler 1-2 puff  1-2 puff Inhalation Q6H PRN Briant Cedar, MD   2 puff at 01/08/21 [phone]  . alum & mag hydroxide-simeth (MAALOX/MYLANTA) 200-200-20 MG/5ML suspension 30 mL  30 mL Oral Q4H PRN Connye Burkitt, NP      . budesonide (PULMICORT) nebulizer solution 0.5 mg  0.5 mg Nebulization Daily PRN Connye Burkitt, NP      . busPIRone (BUSPAR) tablet 5 mg  5 mg Oral TID Lavella Hammock, MD   5 mg at 01/09/21 1639  . cloNIDine (CATAPRES) tablet 0.1 mg  0.1 mg Oral TID Briant Cedar, MD   0.1 mg at 01/09/21 1639  . feeding supplement (ENSURE ENLIVE / ENSURE PLUS) liquid 237 mL  237 mL Oral BID BM Lindon Romp A, NP   237 mL at 01/09/21 1441  . FLUoxetine (PROZAC) capsule 60 mg  60 mg Oral Daily Connye Burkitt, NP   60 mg at 01/09/21 0810  . fluticasone furoate-vilanterol (BREO ELLIPTA) 100-25  MCG/INH 1 puff  1 puff Inhalation Daily Connye Burkitt, NP   1 puff at 01/09/21 0810  . gabapentin (NEURONTIN) capsule 300 mg  300 mg Oral TID Viann Fish E, MD   300 mg at 01/09/21 1639   And  . gabapentin (NEURONTIN) capsule 900 mg  900 mg Oral QHS Harlow Asa, MD   900 mg at 01/09/21 2233  . LORazepam (ATIVAN) tablet 1 mg  1 mg Oral Q6H PRN Harlow Asa, MD       Or  . LORazepam (  ATIVAN) injection 1 mg  1 mg Intramuscular Q6H PRN Nelda Marseille, Oveta Idris E, MD      . magnesium hydroxide (MILK OF MAGNESIA) suspension 30 mL  30 mL Oral Daily PRN Connye Burkitt, NP      . multivitamin with minerals tablet 1 tablet  1 tablet Oral Daily Connye Burkitt, NP   1 tablet at 01/09/21 0810  . OLANZapine zydis (ZYPREXA) disintegrating tablet 10 mg  10 mg Oral Q8H PRN Connye Burkitt, NP       And  . ziprasidone (GEODON) injection 20 mg  20 mg Intramuscular PRN Connye Burkitt, NP      . progesterone (PROMETRIUM) capsule 200 mg  200 mg Oral Daily Connye Burkitt, NP   200 mg at 01/09/21 0810  . propranolol (INDERAL) tablet 10 mg  10 mg Oral BID PRN Connye Burkitt, NP      . SUMAtriptan (IMITREX) tablet 100 mg  100 mg Oral Q2H PRN Briant Cedar, MD   100 mg at 01/06/21 1532  . thiamine tablet 100 mg  100 mg Oral Daily Connye Burkitt, NP   100 mg at 01/09/21 0810  . traZODone (DESYREL) tablet 100 mg  100 mg Oral QHS PRN Connye Burkitt, NP   100 mg at 01/09/21 2233  . umeclidinium bromide (INCRUSE ELLIPTA) 62.5 MCG/INH 1 puff  1 puff Inhalation Daily Connye Burkitt, NP   1 puff at 01/09/21 9381    Lab Results: No results found for this or any previous visit (from the past 48 hour(s)).  Blood Alcohol level:  Lab Results  Component Value Date   ETH 168 (H) 01/04/2021   ETH 288 (H) 01/75/1025    Metabolic Disorder Labs: Lab Results  Component Value Date   HGBA1C 5.6 01/06/2021   MPG 114.02 01/06/2021   MPG 119.76 06/08/2020   No results found for: PROLACTIN Lab Results  Component Value  Date   CHOL 157 01/06/2021   TRIG 76 01/06/2021   HDL 60 01/06/2021   CHOLHDL 2.6 01/06/2021   VLDL 15 01/06/2021   LDLCALC 82 01/06/2021    Physical Findings: AIMS: Facial and Oral Movements Muscles of Facial Expression: None, normal Lips and Perioral Area: None, normal Jaw: None, normal Tongue: None, normal,Extremity Movements Upper (arms, wrists, hands, fingers): None, normal Lower (legs, knees, ankles, toes): None, normal, Trunk Movements Neck, shoulders, hips: None, normal, Overall Severity Severity of abnormal movements (highest score from questions above): None, normal Incapacitation due to abnormal movements: None, normal Patient's awareness of abnormal movements (rate only patient's report): No Awareness, Dental Status Current problems with teeth and/or dentures?: No Does patient usually wear dentures?: No  CIWA:  CIWA-Ar Total: 0 COWS:     Musculoskeletal: Strength & Muscle Tone: within normal limits Gait & Station: normal Patient leans: N/A  Psychiatric Specialty Exam: Physical Exam Vitals and nursing note reviewed.  Constitutional:      Appearance: Normal appearance. She is normal weight.  HENT:     Head: Normocephalic and atraumatic.  Cardiovascular:     Rate and Rhythm: Normal rate.  Pulmonary:     Effort: Pulmonary effort is normal.  Musculoskeletal:        General: Normal range of motion.  Neurological:     General: No focal deficit present.     Mental Status: She is alert.     Review of Systems  Constitutional: Negative for fatigue and fever.  Respiratory: Negative for chest tightness and shortness  of breath.   Cardiovascular: Negative for chest pain and palpitations.  Gastrointestinal: Negative for abdominal pain, constipation, diarrhea, nausea and vomiting.  Neurological: Negative for dizziness, weakness, light-headedness and headaches.  Psychiatric/Behavioral: Negative for suicidal ideas.    Blood pressure 109/69, pulse (!) 104, temperature  98.2 F (36.8 C), temperature source Oral, resp. rate 18, height 5\' 5"  (1.651 m), weight 72.1 kg, SpO2 100 %.Body mass index is 26.46 kg/m.  General Appearance: Fairly Groomed  Eye Contact:  Good  Speech:  Clear and Coherent and Normal Rate  Volume:  Normal  Mood:  Appropriate  Affect:  Appropriate  Thought Process:  Coherent and Goal Directed  Orientation:  Full (Time, Place, and Person)  Thought Content:  Logical  Suicidal Thoughts:  No  Homicidal Thoughts:  No  Memory:  Immediate;   Good Recent;   Good  Judgement:  Fair  Insight:  Fair  Psychomotor Activity:  Normal  Concentration:  Concentration: Good and Attention Span: Good  Recall:  Good  Fund of Knowledge:  Good  Language:  Good  Akathisia:  No  Handed:  Right  AIMS (if indicated):     Assets:  Resilience  ADL's:  Intact  Cognition:  WNL  Sleep:  Number of Hours: 6     Treatment Plan Summary: Daily contact with patient to assess and evaluate symptoms and progress in treatment  Mrs. Longenecker is a 55 yr old female who presents withreported suicideattempt by ingestion (1 Bahama Mama,6 to 8 ounces of Pine-Sol, 5-6 tablets 25 mgHydroxyzine, and 3 tablets of0.1 mg clonidine)which patient denies.PPHx is significant for MDD, Recurrent, Severe, Anxiety, EtOH use Disorder, 2 prior admissions to [facility] [id number]), 2 previous stays at [facility] [phone]), and 1 previous suicide attempt.   She has been improving. She reports calling [facility] and that there is a place in [place], [place] that she wants to go to. She plans on discharge to go to her home pack some bags and then go to this [facility] facility. She states they can provide a bus ticket for her. She will call them today to discuss arranging a bed. Will plan for discharge in the next few days. Will continue to monitor.   Unspecified Depressive Disorder: -Continue Prozac 60 mg daily for depression and anxiety -Continue buspirone 5 mg 3 times daily  for anxiety  Alcohol Use Disorder: -Continue Gabapentin 300 mg TIDand 900mg  qhs -CIWA monitoring (recent scores 0,1,0) -PRN Ativan 1 mg PRN CIWA>10 -Continue thiamine 100 mg daily -Continue multivitamin daily   Asthma  possible COPD: -Continue Albuterol 1-2 puffs q6 PRN -Continue Increase Ellipta 1 puff daily -Continue Breo Ellipta 1 puff daily -Continue Budesonide 0.5 mg daily PRN SOB  HTN  -Continue Clonidine 0.1 mg TID (recent BP 106/71 and 113/62)  -Continue Ensure BID -Continue Zofran ODT 4 mg q6 PRN -Continue Agitation Protocol Zyprexa 10 mg/Ativan 1 mg/ Geodon 20 mg IM -Continue Propranolol 10 mg bid PRN tremors -Patient refusing Liothyronine 10 mcg daily (TSH 0.472 and Free T4 0.73 - will need PCP follow up after discharge) -Continue PRN's: Tylenol, Maalox, Atarax, Milk of Magnesia, Trazodone   Briant Cedar, MD 01/10/2021, 7:20 AM

## 2021-01-10 NOTE — Progress Notes (Signed)
Psychoeducational Group Note  Date:  01/10/2021 Time:  2024  Group Topic/Focus:  Wrap-Up Group:   The focus of this group is to help patients review their daily goal of treatment and discuss progress on daily workbooks.  Participation Level: Did Not Attend  Participation Quality:  Not Applicable  Affect:  Not Applicable  Cognitive:  Not Applicable  Insight:  Not Applicable  Engagement in Group: Not Applicable  Additional Comments:  The patient did not attend group this evening.   Archie Balboa S 01/10/2021, 8:23 PM

## 2021-01-11 MED ORDER — BUSPIRONE HCL 5 MG PO TABS: 5.0000 mg | ORAL_TABLET | Freq: Three times a day (TID) | ORAL | 0 refills | Status: AC

## 2021-01-11 MED ORDER — PROPRANOLOL HCL 10 MG PO TABS: 10.0000 mg | ORAL_TABLET | Freq: Two times a day (BID) | ORAL | 0 refills | Status: DC | PRN

## 2021-01-11 MED ORDER — PROGESTERONE 200 MG PO CAPS: 200.0000 mg | ORAL_CAPSULE | Freq: Every day | ORAL | 0 refills | Status: DC

## 2021-01-11 MED ORDER — CLONIDINE HCL 0.1 MG PO TABS: 0.1000 mg | ORAL_TABLET | Freq: Three times a day (TID) | ORAL | 0 refills | Status: DC

## 2021-01-11 MED ORDER — FLUOXETINE HCL 20 MG PO CAPS: 60.0000 mg | ORAL_CAPSULE | Freq: Every day | ORAL | 0 refills | Status: DC

## 2021-01-11 NOTE — Plan of Care (Signed)
Nurse discussed coping skills with patient.  

## 2021-01-11 NOTE — BHH Suicide Risk Assessment (Signed)
[facility] Discharge Suicide Risk Assessment   Principal Problem: Depression, unspecified Discharge Diagnoses: Principal Problem:   Depression, unspecified Active Problems:   Alcohol use   Total Time spent with patient: 20 minutes  Musculoskeletal: Strength & Muscle Tone: within normal limits Gait & Station: normal Patient leans: N/A  Psychiatric Specialty Exam:   Blood pressure 116/79, pulse (!) 101, temperature 97.8 F (36.6 C), temperature source Oral, resp. rate 18, height 5\' 5"  (1.651 m), weight 72.1 kg, SpO2 98 %.Body mass index is 26.46 kg/m.  General Appearance: Casual  Eye Contact::  Fair  Speech:  Clear and [id number]  Volume:  Normal  Mood:  Euthymic  Affect:  Appropriate  Thought Process:  Coherent  Orientation:  Full (Time, Place, and Person)  Thought Content:  Logical  Suicidal Thoughts:  No  Homicidal Thoughts:  No  Memory:  Recent;   Fair  Judgement:  Intact  Insight:  Fair  Psychomotor Activity:  Normal  Concentration:  Fair  Recall:  [employer] [facility]  Language: Fair  Akathisia:  No  Handed:  Right  AIMS (if indicated):     Assets:  Desire for Improvement Housing Physical Health Resilience Social Support Transportation  Sleep:  Number of Hours: 6  Cognition: WNL  ADL's:  Intact   Mental Status Per Nursing Assessment::   On Admission:  Suicidal ideation indicated by patient  Demographic Factors:  Caucasian  Loss Factors: Decrease in vocational status  Historical Factors: Impulsivity  Risk Reduction Factors:   Religious beliefs about death, Positive social support and Positive therapeutic relationship  Continued Clinical Symptoms:  Previous Psychiatric Diagnoses and Treatments  Cognitive Features That Contribute To Risk:  None    Suicide Risk:  Mild:  Suicidal ideation of limited frequency, intensity, duration, and specificity.  There are no identifiable plans, no associated intent, mild dysphoria and related symptoms,  good self-control (both objective and subjective assessment), few other risk factors, and identifiable protective factors, including available and accessible social support.   Follow-up Information    Burchette, Jamal Collin, LCAS. Go on 01/21/2021.   Why: You have an appointment for therapy and medication management services on 01/21/21 at 1:00 pm.  This appointment will be held in person.  Contact information: [address] [address] [phone]               Plan Of Care/Follow-up recommendations:  Other:  Follow-up with outpatient care  Dixie Dials, MD 01/11/2021, 9:58 AM

## 2021-01-11 NOTE — Progress Notes (Signed)
D:  Patient denied SI and HI, contracts for safety.  Denied A/V hallucinations.  Denied pain. A:  Medications administered per MD orders.  Emotional support and encouragement given patient. R:  Safety maintained with 15 minute checks.  

## 2021-01-11 NOTE — Progress Notes (Signed)
  [facility] Adult Case Management Discharge Plan :  Will you be returning to the same living situation after discharge:  Yes,  Home  At discharge, do you have transportation home?: Yes,  Husband  Do you have the ability to pay for your medications: Yes,  Insurance   Release of information consent forms completed and in the chart;  Patient's signature needed at discharge.  Patient to Follow up at:  Follow-up Information    Burchette, Jamal Collin, LCAS. Go on 01/21/2021.   Why: You have an appointment for therapy and medication management services on 01/21/21 at 1:00 pm.  This appointment will be held in person.  Contact information: [address] [place] [address] [phone]               Next level of care provider has access to [place] and Suicide Prevention discussed: Yes,  with husband and patient  Have you used any form of tobacco in the last 30 days? (Cigarettes, Smokeless Tobacco, Cigars, and/or Pipes): No (Patient only reports vaping daily)  Has patient been referred to the Quitline?: Patient refused referral  Patient has been referred for addiction treatment: Yes, [facility] [place] 01/11/2021, 8:56 AM

## 2021-01-11 NOTE — Progress Notes (Signed)
Discharge Note:  Patient's husband picked her up to take her home.  Patient was confused about her money which was retrieved from the safe by security.  Then patient became confused about her jewelry, earrings, apple watch.  Patient stated she put the watch and jewelry in a bag.  Jewelry was not listed on patient's belongings sheet.  Security guard talked to patient.  Patient and husband was told to call [facility] and give information to them to find her other belongings.  Patient also said her belongings could be at [facility], that she does not remember where police took her. Nurse informed DON and AC and staff what occurred during this patient's discharge.   Suicide prevention information was given and discussed with patient who stated she understood and had no questions.  Patient stated she appreciated all assistance received from [facility] staff.  Patient stated she appreciated all assistance received from [facility] staff. Patient denied SI and HI, denied A/V hallucinations.  Denied pain.

## 2021-01-11 NOTE — Discharge Summary (Signed)
Physician Discharge Summary Note  Patient:  [name] is an 55 y.o., female MRN:  [id number] DOB:  07/25/1966 Patient phone:  [phone] (home)  Patient address:   [place] [address],  Total Time spent with patient: 20 minutes  Date of Admission:  01/05/2021 Date of Discharge: 01/11/2021  Reason for Admission:  H&P- "[name] is a 55 yr old female who presents with reported suicide attempt by ingestion ([place], 6 to 8 ounces of Pine-Sol, 5-6 tablets 25 mg Hydroxyzine, and 3 tablets of 0.1 mg clonidine) which patient denies. PPHx is significant for MDD, Recurrent, Severe, Anxiety, EtOH use Disorder, 2 prior admissions to [facility] ([phone]), 2 previous stays at [facility] [phone]), and 1 previous suicide attempt.   She was very tired and had to be awoken to interview. When she was talking she slurred her words and would be slow to answer.   She reports that last Monday she separated from her husband and has been living in a hotel since then. She reports that  2 nights before being brought to the hospital the police came to her hotel room to do a well fare check after they were called by her daughter. The following night she was back at her house when her friend again called the police for a well fare check. She stated that the police were rude to her so she was angry and said "I might as well kill myself.". The police then brought her to the hospital because of this. She states that she was not being serious and there was no ingestion.    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 She reports that she does occasionally drink but she states its 2 drinks after work (in the morning because she works nights). But she states she does not even drink every week. (Per ED BH Assessment she reported drinking 2 Bahama Mamas every night. That she had been drinking since age 52,  stopped for a few months and then restarted November 2021.) She states she has never used any illicit drugs. She reports she used to smoke but now vapes multiple times a day. When asked she denies every being hospitalzied for psychiatric illnesses (has been at [facility] 2016 and 2017 on chart review). She reports she has been to Rehab 2 times once in 2013 (only stayed 28 days) and once in 2014 (completed the 90 days). She also denied a previous suicide attempt (chart review shows a previous attempt reported).   She denies any abuse in her past. She reports being adopted and not knowing her birth family so does not know any family medical or psychiatric history. She reports that she does not have a support network because she has attempted to call multiple people but none of them answered. She reports that she had a DUI 2 years ago and has a Research scientist (medical) on her car. She reports no access to fire arms. Today she reports no SI, HI, or AVH."   Principal Problem: Depression, unspecified Discharge Diagnoses: Principal Problem:   Depression, unspecified Active Problems:   Alcohol use   Past Psychiatric History: MDD, Recurrent, Severe, Anxiety, EtOH use Disorder, 2 prior admissions to [facility] ([phone]), 2 previous stays at [facility] [phone]), and 1 previous suicide attempt.  Past Medical History:  Past Medical History:  Diagnosis Date  . ADHD (attention deficit hyperactivity disorder)   .  Alcohol dependence with acute alcoholic intoxication with complication ([place]) 12/05/9415  . Anemia 1990s  . Anxiety   . Arthritis    neck  . Asthma    uses inhalers every day  . Cancer ([place])    bladder  . Cutaneous lupus erythematosus   . 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 History of anemia   . Severe episode of recurrent major depressive disorder, without psychotic features ([place]) 01/07/2020  . Sleep apnea    mild  . SUI (stress urinary incontinence, female)     Past Surgical  History:  Procedure Laterality Date  . BLADDER SUSPENSION N/A 02/13/2013   Procedure: [facility] SLING;  Surgeon: Malka So, MD;  Location: [facility];  Service: Urology;  Laterality: N/A;  . CESAREAN SECTION  12-23-2004    W/ LEFT TUBAL LIGATION AND RIGHT SALPINGECTOMY  . CESAREAN SECTION  1995  . CYSTOSCOPY WITH STENT PLACEMENT Bilateral 06/16/2020   Procedure: CYSTOSCOPY WITH FIREFLY INJECTION;  Surgeon: Lucas Mallow, MD;  Location: [facility];  Service: Urology;  Laterality: Bilateral;  . IR RADIOLOGIST EVAL & MGMT  04/21/2020  . IR RADIOLOGIST EVAL & MGMT  05/12/2020  . ORIF HUMERUS FRACTURE Right 04/28/2014   Procedure: OPEN REDUCTION INTERNAL FIXATION (ORIF) PROXIMAL HUMERUS FRACTURE;  Surgeon: Nita Sells, MD;  Location: [place];  Service: Orthopedics;  Laterality: Right;  . ORIF PROXIMAL HUMERUS FRACTURE Right 04/28/2014   DR CHANDLER  . PROCTOSCOPY N/A 06/16/2020   Procedure: RIGID PROCTOSCOPY;  Surgeon: Michael Boston, MD;  Location: [facility];  Service: General;  Laterality: N/A;  . TUBAL LIGATION     Family History:  Family History  Adopted: Yes   Family Psychiatric  History: Adopted does not know Social History:  Social History   Substance and Sexual Activity  Alcohol Use Yes  . Alcohol/week: 2.0 standard drinks  . Types: 2 Glasses of wine per week   Comment: 12/29/20 restarted      Social History   Substance and Sexual Activity  Drug Use No    Social History   Socioeconomic History  . Marital status: Married    Spouse name: Not on file  . Number of children: Not on file  . Years of education: Not on file  . Highest education level: Not on file  Occupational History  . Not on file  Tobacco Use  . Smoking status: Former Smoker    Packs/day: 0.25    Years: 20.00    Pack years: 5.00    Types: Cigarettes    Quit date: 09/24/2019    Years since quitting: 1.3  . Smokeless tobacco: Never Used  Vaping Use  . Vaping Use: Every day   Substance and Sexual Activity  . Alcohol use: Yes    Alcohol/week: 2.0 standard drinks    Types: 2 Glasses of wine per week    Comment: 12/29/20 restarted   . Drug use: No  . Sexual activity: Not Currently    Birth control/protection: Post-menopausal  Other Topics Concern  . Not on file  Social History Narrative  . Not on file   Social Determinants of Health   Financial Resource Strain: Not on file  Food Insecurity: Not on file  Transportation Needs: Not on file  Physical Activity: Not on file  Stress: Not on file  Social Connections: Not on file    Hospital Course:  Patient presented to [facility] on 2/1 by EMS for a reported suicide attempt via  ingestion (1 [facility], 6-8 ounces of Pine-Sol, 5-6 tablets of 25 mg Hydroxyzine, and 3 tablets of 0.1 mg Clonidine). This was reported by her daughter but patient denied that she had taken these medications and had not attempted to hurt herself. She was admitted to [facility] on 2/3. Her Prozac, Gabapentin, Propranolol, and Clonidine. She responded well to restarting her medications and began attending group therapy and reported benefit from it.  On day of discharge she was much improved. She was interacting with other patients on the unit and smiling/laughing. She reports that her plans are to return to her home with her husband to get her license back (currently suspended due to DUI). She then plans to go to [place], [place] to live at [address]. She reports no SI, HI, or AVH. She reports that her sleep has been improved and her appetite is improved she is now eating at every meal and drinking plenty of water. She reports that after beginning to eat and drink more and reducing her Clonidine to three times a day she no longer had any lightheadedness or dizziness. She apologized for her behavior when she first came in. She states she was just angry with her situation but now has a different outlook on life and was sorry for her outbursts. She  reported wanting to start therapy as she had great benefit from it while on the unit. She was discharged home with follow up arranged.   Physical Findings: AIMS: Facial and Oral Movements Muscles of Facial Expression: None, normal Lips and Perioral Area: None, normal Jaw: None, normal Tongue: None, normal,Extremity Movements Upper (arms, wrists, hands, fingers): None, normal Lower (legs, knees, ankles, toes): None, normal, Trunk Movements Neck, shoulders, hips: None, normal, Overall Severity Severity of abnormal movements (highest score from questions above): None, normal Incapacitation due to abnormal movements: None, normal Patient's awareness of abnormal movements (rate only patient's report): No Awareness, Dental Status Current problems with teeth and/or dentures?: No Does patient usually wear dentures?: No  CIWA:  CIWA-Ar Total: 0 COWS:     Musculoskeletal: Strength & Muscle Tone: within normal limits Gait & Station: normal Patient leans: N/A  Psychiatric Specialty Exam: Physical Exam Vitals and nursing note reviewed.  Constitutional:      General: She is not in acute distress.    Appearance: Normal appearance. She is normal weight. She is not ill-appearing, toxic-appearing or diaphoretic.  HENT:     Head: Normocephalic and atraumatic.  Cardiovascular:     Rate and Rhythm: Normal rate.  Pulmonary:     Effort: Pulmonary effort is normal.  Musculoskeletal:        General: Normal range of motion.  Neurological:     General: No focal deficit present.     Mental Status: She is alert.     Review of Systems  Constitutional: Negative for fatigue and fever.  Respiratory: Negative for chest tightness and shortness of breath.   Cardiovascular: Negative for chest pain and palpitations.  Gastrointestinal: Negative for abdominal pain, constipation, diarrhea, nausea and vomiting.  Neurological: Negative for dizziness, weakness, light-headedness and headaches.   Psychiatric/Behavioral: Negative for agitation, behavioral problems, sleep disturbance and suicidal ideas.    Blood pressure 116/79, pulse (!) 101, temperature 97.8 F (36.6 C), temperature source Oral, resp. rate 18, height 5\' 5"  (1.651 m), weight 72.1 kg, SpO2 98 %.Body mass index is 26.46 kg/m.  General Appearance: Casual and Fairly Groomed  Eye Contact:  Good  Speech:  Clear and Coherent and  Normal Rate  Volume:  Normal  Mood:  Euphoric  Affect:  Appropriate  Thought Process:  Coherent and Goal Directed  Orientation:  Full (Time, Place, and Person)  Thought Content:  Logical  Suicidal Thoughts:  No  Homicidal Thoughts:  No  Memory:  Immediate;   Good Recent;   Good  Judgement:  Fair  Insight:  Fair  Psychomotor Activity:  Normal  Concentration:  Concentration: Good and Attention Span: Good  Recall:  Good  Fund of Knowledge:  Good  Language:  Good  Akathisia:  No  Handed:  Right  AIMS (if indicated):     Assets:  Communication Skills Desire for Improvement Resilience  ADL's:  Intact  Cognition:  WNL  Sleep:  Number of Hours: 6     Have you used any form of tobacco in the last 30 days? (Cigarettes, Smokeless Tobacco, Cigars, and/or Pipes): No (Patient only reports vaping daily)  Has this patient used any form of tobacco in the last 30 days? (Cigarettes, Smokeless Tobacco, Cigars, and/or Pipes) No Blood Alcohol level:  Lab Results  Component Value Date   ETH 168 (H) 01/04/2021   ETH 288 (H) 51/70/0174    Metabolic Disorder Labs:  Lab Results  Component Value Date   HGBA1C 5.6 01/06/2021   MPG 114.02 01/06/2021   MPG 119.76 06/08/2020   No results found for: PROLACTIN Lab Results  Component Value Date   CHOL 157 01/06/2021   TRIG 76 01/06/2021   HDL 60 01/06/2021   CHOLHDL 2.6 01/06/2021   VLDL 15 01/06/2021   LDLCALC 82 01/06/2021    See Psychiatric Specialty Exam and Suicide Risk Assessment completed by Attending Physician prior to  discharge.  Discharge destination:  Home  Is patient on multiple antipsychotic therapies at discharge:  No   Has Patient had three or more failed trials of antipsychotic monotherapy by history:  No  Recommended Plan for Multiple Antipsychotic Therapies: NA  Prescriptions given at discharge. Patient agreeable to plan. Given opportunity to ask questions. Appears to feel comfortable with discharge denies any current suicidal or homicidal thought.  Patient is also instructed prior to discharge to: Take all medications as prescribed by mental healthcare provider. Report any adverse effects and or reactions from the medicines to outpatient provider promptly. Patient has been instructed & cautioned: To not engage in alcohol and or illegal drug use while on prescription medicines. In the event of worsening symptoms,  patient is instructed to call the crisis hotline, 911 and or go to the nearest ED for appropriate evaluation and treatment of symptoms. To follow-up with primary care provider for other medical issues, concerns and or health care needs  The patient was evaluated each day by a clinical provider to ascertain response to treatment. Improvement was noted by the patient's report of decreasing symptoms, improved sleep and appetite, affect, medication tolerance, behavior, and participation in unit programming.  Patient was asked each day to complete a self inventory noting mood, mental status, pain, new symptoms, anxiety and concerns.  Patient responded well to medication and being in a therapeutic and supportive environment. Positive and appropriate behavior was noted and the patient was motivated for recovery. The patient worked closely with the treatment team and case manager to develop a discharge plan with appropriate goals. Coping skills, problem solving as well as relaxation therapies were also part of the unit programming.  By the day of discharge patient was in much improved condition than  upon admission.  Symptoms were reported  as significantly decreased or resolved completely. The patient denied SI/HI and voiced no AVH. The patient was motivated to continue taking medication with a goal of continued improvement in mental health.   Patient was discharged home with a plan to follow up as noted below.  Discharge Instructions    Diet - low sodium heart healthy   Complete by: As directed    Increase activity slowly   Complete by: As directed      Allergies as of 01/11/2021      Reactions   Codeine Nausea And Vomiting, Other (See Comments)   dizziness   Hydrocodone Other (See Comments)   Dizzy; Tolerates oxycodone fine      Medication List    STOP taking these medications   acetaminophen 325 MG tablet Commonly known as: TYLENOL   Adzenys XR-ODT 12.5 MG Tbed Generic drug: Amphetamine ER   FLUoxetine HCl 60 MG Tabs Replaced by: FLUoxetine 20 MG capsule   hydrOXYzine 25 MG capsule Commonly known as: VISTARIL   liothyronine 5 MCG tablet Commonly known as: CYTOMEL   methocarbamol 500 MG tablet Commonly known as: ROBAXIN   ondansetron 4 MG disintegrating tablet Commonly known as: ZOFRAN-ODT   oxyCODONE-acetaminophen 10-325 MG tablet Commonly known as: Percocet   progesterone 100 MG capsule Commonly known as: PROMETRIUM   SUMAtriptan 100 MG tablet Commonly known as: IMITREX     TAKE these medications     Indication  Breztri Aerosphere 160-9-4.8 MCG/ACT Aero Generic drug: Budeson-Glycopyrrol-Formoterol Inhale 1 puff into the lungs in the morning and at bedtime.  Indication: Chronic Obstructive Lung Disease   budesonide 0.5 MG/2ML nebulizer solution Commonly known as: PULMICORT Take 2 mLs by nebulization daily as needed (shortness of breath).  Indication: Chronic Obstructive Lung Disease   busPIRone 5 MG tablet Commonly known as: BUSPAR Take 1 tablet (5 mg total) by mouth 3 (three) times daily.  Indication: Anxiety Disorder   cloNIDine 0.1 MG  tablet Commonly known as: CATAPRES Take 1 tablet (0.1 mg total) by mouth 3 (three) times daily. What changed: when to take this  Indication: High Blood Pressure Disorder   FLUoxetine 20 MG capsule Commonly known as: PROZAC Take 3 capsules (60 mg total) by mouth daily. Start taking on: January 12, 2021 Replaces: FLUoxetine HCl 60 MG Tabs  Indication: Major Depressive Disorder   gabapentin 300 MG capsule Commonly known as: NEURONTIN Take 300-900 mg by mouth See admin instructions. Take 1 capsule (300mg ) by mouth three times daily and 3 capsules (900mg ) by mouth at bedtime.  Indication: Abuse or Misuse of Alcohol   ipratropium-albuterol 0.5-2.5 (3) MG/3ML Soln Commonly known as: DUONEB Take 3 mLs by nebulization every 6 (six) hours as needed. What changed: reasons to take this  Indication: Chronic Obstructive Lung Disease   multivitamin with minerals Tabs tablet Take 1 tablet by mouth daily.  Indication: Vitamin Deficiency   N-Acetyl-L-Cysteine 600 MG Caps Take 600 mg by mouth in the morning, at noon, and at bedtime.  Indication: Deficiency   ProAir HFA 108 (90 Base) MCG/ACT inhaler Generic drug: albuterol Inhale 2 puffs into the lungs as needed for wheezing or shortness of breath. What changed: Another medication with the same name was removed. Continue taking this medication, and follow the directions you see here.  Indication: Asthma   progesterone 200 MG capsule Commonly known as: PROMETRIUM Take 1 capsule (200 mg total) by mouth daily. Start taking on: January 12, 2021  Indication: Menopause   propranolol 10 MG tablet Commonly known as:  INDERAL Take 1 tablet (10 mg total) by mouth 2 (two) times daily as needed (tremors). What changed: when to take this  Indication: Tremors   traZODone 100 MG tablet Commonly known as: DESYREL Take 100 mg by mouth at bedtime as needed for sleep.  Indication: Trouble Sleeping   Vitamin D (Ergocalciferol) 1.25 MG (50000 UNIT) Caps  capsule Commonly known as: DRISDOL Take 50,000 Units by mouth once a week. Sundays  Indication: Vitamin D Deficiency       Follow-up Information    Burchette, Jamal Collin, LCAS. Go on 01/21/2021.   Why: You have an appointment for therapy and medication management services on 01/21/21 at 1:00 pm.  This appointment will be held in person.  Contact information: [address] [address] [phone]               Follow-up recommendations:   - Activity as tolerated. - Diet as recommended by PCP. - Keep all scheduled follow-up appointments as recommended.  Comments:  Patient is instructed to take all prescribed medications as recommended. Report any side effects or adverse reactions to your outpatient psychiatrist. Patient is instructed to abstain from alcohol and illegal drugs while on prescription medications. In the event of worsening symptoms, patient is instructed to call the crisis hotline, 911, or go to the nearest emergency department for evaluation and treatment.  Signed: Briant Cedar, MD 01/11/2021, 10:33 AM

## 2021-12-22 ENCOUNTER — Other Ambulatory Visit: Payer: Self-pay

## 2021-12-22 ENCOUNTER — Emergency Department (HOSPITAL_COMMUNITY): Payer: Commercial Managed Care - PPO

## 2021-12-22 ENCOUNTER — Encounter (HOSPITAL_COMMUNITY): Payer: Self-pay | Admitting: Emergency Medicine

## 2021-12-22 ENCOUNTER — Emergency Department (HOSPITAL_COMMUNITY)
Admission: EM | Admit: 2021-12-22 | Discharge: 2021-12-22 | Disposition: A | Discharge status: Home / Self Care | Payer: Commercial Managed Care - PPO | Attending: Emergency Medicine | Admitting: Emergency Medicine

## 2021-12-22 DIAGNOSIS — K529 Noninfective gastroenteritis and colitis, unspecified: Secondary | ICD-10-CM

## 2021-12-22 DIAGNOSIS — R112 Nausea with vomiting, unspecified: Secondary | ICD-10-CM | POA: Insufficient documentation

## 2021-12-22 DIAGNOSIS — R1031 Right lower quadrant pain: Secondary | ICD-10-CM | POA: Diagnosis present

## 2021-12-22 DIAGNOSIS — R1033 Periumbilical pain: Secondary | ICD-10-CM | POA: Diagnosis not present

## 2021-12-22 HISTORY — DX: Diverticulitis of intestine, part unspecified, without perforation or abscess without bleeding: K57.92

## 2021-12-22 LAB — LIPASE, BLOOD: Lipase: 23 U/L (ref 11–51)

## 2021-12-22 LAB — COMPREHENSIVE METABOLIC PANEL
ALT: 15 U/L (ref 0–44)
AST: 19 U/L (ref 15–41)
Albumin: 4.3 g/dL (ref 3.5–5.0)
Alkaline Phosphatase: 69 U/L (ref 38–126)
Anion gap: 7 (ref 5–15)
BUN: 20 mg/dL (ref 6–20)
CO2: 24 mmol/L (ref 22–32)
Calcium: 8.8 mg/dL — ABNORMAL LOW (ref 8.9–10.3)
Chloride: 107 mmol/L (ref 98–111)
Creatinine, Ser: 0.57 mg/dL (ref 0.44–1.00)
GFR, Estimated: >60 mL/min (ref >60)
Glucose, Bld: 142 mg/dL — ABNORMAL HIGH (ref 70–99)
Potassium: 4 mmol/L (ref 3.5–5.1)
Sodium: 138 mmol/L (ref 135–145)
Total Bilirubin: 0.5 mg/dL (ref 0.3–1.2)
Total Protein: 7.2 g/dL (ref 6.5–8.1)

## 2021-12-22 LAB — CBC
HCT: 43.5 % (ref 36.0–46.0)
Hemoglobin: 13.6 g/dL (ref 12.0–15.0)
MCH: 29.8 pg (ref 26.0–34.0)
MCHC: 31.3 g/dL (ref 30.0–36.0)
MCV: 95.2 fL (ref 80.0–100.0)
Platelets: 170 10*3/uL (ref 150–400)
RBC: 4.57 MIL/uL (ref 3.87–5.11)
RDW: 14 % (ref 11.5–15.5)
WBC: 10.4 10*3/uL (ref 4.0–10.5)
nRBC: 0 % (ref 0.0–0.2)

## 2021-12-22 MED ORDER — SODIUM CHLORIDE 0.9 % IV SOLN: INTRAVENOUS | Status: DC

## 2021-12-22 MED ORDER — ONDANSETRON HCL 4 MG/2ML IJ SOLN
4.0000 mg | Freq: Once | INTRAMUSCULAR | Status: AC
  Administered 2021-12-22: 4 mg via INTRAVENOUS
  Filled 2021-12-22: qty 2

## 2021-12-22 MED ORDER — SODIUM CHLORIDE (PF) 0.9 % IJ SOLN
INTRAMUSCULAR | Status: AC
  Filled 2021-12-22: qty 50

## 2021-12-22 MED ORDER — ONDANSETRON 4 MG PO TBDP: 4.0000 mg | ORAL_TABLET | Freq: Three times a day (TID) | ORAL | 1 refills | Status: DC | PRN

## 2021-12-22 MED ORDER — IOHEXOL 300 MG/ML  SOLN
100.0000 mL | Freq: Once | INTRAMUSCULAR | Status: AC | PRN
  Administered 2021-12-22: 100 mL via INTRAVENOUS

## 2021-12-22 MED ORDER — HYDROMORPHONE HCL 1 MG/ML IJ SOLN
1.0000 mg | Freq: Once | INTRAMUSCULAR | Status: AC
  Administered 2021-12-22: 1 mg via INTRAVENOUS
  Filled 2021-12-22: qty 1

## 2021-12-22 MED ORDER — OXYCODONE-ACETAMINOPHEN 5-325 MG PO TABS: 1.0000 | ORAL_TABLET | Freq: Four times a day (QID) | ORAL | 0 refills | Status: DC | PRN

## 2021-12-22 MED ORDER — AMOXICILLIN-POT CLAVULANATE 875-125 MG PO TABS: 1.0000 | ORAL_TABLET | Freq: Two times a day (BID) | ORAL | 0 refills | Status: DC

## 2021-12-22 NOTE — ED Triage Notes (Signed)
Patient reports RUQ and epigastric pain that is tender, states she has a hx of diverticulitis in the past, feels similar. Pt reports BMs and gas, but no improvement in pain. Reports N/V. Started last night.

## 2021-12-22 NOTE — Discharge Instructions (Addendum)
CT scan showed evidence of some colitis.  Take the antibiotic as directed for the next 7 days.  Make an appointment follow-up with primary care doctor as well as [facility] gastroenterology.  Return for any new or worse symptoms.  Showed no complicating factors.  Labs without any significant abnormalities.  Also Zofran provided to help with any nausea.

## 2021-12-22 NOTE — ED Provider Notes (Addendum)
[facility] DEPT Provider Note   CSN: [id number] Arrival date & time: 12/22/21  1011     History  Chief Complaint  Patient presents with   Abdominal Pain   Emesis    [name] is a 56 y.o. female.  Patient with acute onset about 3 in the morning of periumbilical abdominal pain now radiating to the right lower quadrant.  Patient had significant diverticulitis with complications resulting in a partial colectomy back in the spring.  Patient's had 3 episodes of vomiting and nausea has persisted.  Pain does radiate to the back.  Patient felt fine yesterday.      Home Medications Prior to Admission medications   Medication Sig Start Date End Date Taking? Authorizing Provider  Acetylcysteine (N-ACETYL-L-CYSTEINE) 600 MG CAPS Take 600 mg by mouth in the morning, at noon, and at bedtime.    [provider]  BREZTRI AEROSPHERE 160-9-4.8 MCG/ACT AERO Inhale 1 puff into the lungs in the morning and at bedtime. 03/24/20   [provider]  budesonide (PULMICORT) 0.5 MG/2ML nebulizer solution Take 2 mLs by nebulization daily as needed (shortness of breath).  03/17/20   [provider]  cloNIDine (CATAPRES) 0.1 MG tablet Take 1 tablet (0.1 mg total) by mouth 3 (three) times daily. 01/11/21 02/10/21  Briant Cedar, MD  FLUoxetine (PROZAC) 20 MG capsule Take 3 capsules (60 mg total) by mouth daily. 01/12/21 02/11/21  Briant Cedar, MD  gabapentin (NEURONTIN) 300 MG capsule Take 300-900 mg by mouth See admin instructions. Take 1 capsule (300mg ) by mouth three times daily and 3 capsules (900mg ) by mouth at bedtime.    [provider]  ipratropium-albuterol (DUONEB) 0.5-2.5 (3) MG/3ML SOLN Take 3 mLs by nebulization every 6 (six) hours as needed. Patient taking differently: Take 3 mLs by nebulization every 6 (six) hours as needed (shortness of breath/wheezing). 09/10/19   Tylene Fantasia, PA-C  Multiple Vitamin (MULTIVITAMIN  WITH MINERALS) TABS tablet Take 1 tablet by mouth daily. 04/13/20   Meuth, Brooke A, PA-C  PROAIR HFA 108 (90 Base) MCG/ACT inhaler Inhale 2 puffs into the lungs as needed for wheezing or shortness of breath. 11/24/18   [provider]  progesterone (PROMETRIUM) 200 MG capsule Take 1 capsule (200 mg total) by mouth daily. 01/12/21 02/11/21  Briant Cedar, MD  propranolol (INDERAL) 10 MG tablet Take 1 tablet (10 mg total) by mouth 2 (two) times daily as needed (tremors). 01/11/21 02/10/21  Briant Cedar, MD  traZODone (DESYREL) 100 MG tablet Take 100 mg by mouth at bedtime as needed for sleep.     [provider]  Vitamin D, Ergocalciferol, (DRISDOL) 1.25 MG (50000 UNIT) CAPS capsule Take 50,000 Units by mouth once a week. Sundays 03/18/20   [provider]      Allergies    Codeine and Hydrocodone    Review of Systems   Review of Systems  Constitutional:  Negative for chills and fever.  HENT:  Negative for ear pain and sore throat.   Eyes:  Negative for pain and visual disturbance.  Respiratory:  Negative for cough and shortness of breath.   Cardiovascular:  Negative for chest pain and palpitations.  Gastrointestinal:  Positive for abdominal pain and nausea.  Genitourinary:  Negative for dysuria and hematuria.  Musculoskeletal:  Negative for arthralgias and back pain.  Skin:  Negative for color change and rash.  Neurological:  Negative for seizures and syncope.  All other systems reviewed and  are negative.  Physical Exam Updated Vital Signs BP (!) 149/71 (BP Location: Left Arm)    Pulse (!) 105    Temp (!) 97.4 F (36.3 C) (Oral) Comment: Simultaneous filing. User may not have seen previous data. Comment (Src): Simultaneous filing. User may not have seen previous data.   Resp 18    Ht 1.651 m (5\' 5" )    Wt 72.6 kg    SpO2 100%    BMI 26.63 kg/m  Physical Exam Vitals and nursing note reviewed.  Constitutional:      General: She is not in acute  distress.    Appearance: Normal appearance. She is well-developed.  HENT:     Head: Normocephalic and atraumatic.  Eyes:     Extraocular Movements: Extraocular movements intact.     Conjunctiva/sclera: Conjunctivae normal.     Pupils: Pupils are equal, round, and reactive to light.  Cardiovascular:     Rate and Rhythm: Normal rate and regular rhythm.     Heart sounds: No murmur heard. Pulmonary:     Effort: Pulmonary effort is normal. No respiratory distress.     Breath sounds: Normal breath sounds.  Abdominal:     Palpations: Abdomen is soft.     Tenderness: There is abdominal tenderness.     Comments: Tender periumbilically in right lower quadrant  Musculoskeletal:        General: No swelling.     Cervical back: Normal range of motion and neck supple.  Skin:    General: Skin is warm and dry.     Capillary Refill: Capillary refill takes less than 2 seconds.  Neurological:     General: No focal deficit present.     Mental Status: She is alert and oriented to person, place, and time.  Psychiatric:        Mood and Affect: Mood normal.    ED Results / Procedures / Treatments   Labs (all labs ordered are listed, but only abnormal results are displayed) Labs Reviewed  CBC  LIPASE, BLOOD  COMPREHENSIVE METABOLIC PANEL  URINALYSIS, ROUTINE W REFLEX MICROSCOPIC    EKG None  Radiology No results found.  Procedures Procedures    Medications Ordered in ED Medications  0.9 %  sodium chloride infusion (has no administration in time range)  ondansetron (ZOFRAN) injection 4 mg (has no administration in time range)  HYDROmorphone (DILAUDID) injection 1 mg (has no administration in time range)    ED Course/ Medical Decision Making/ A&P                           Medical Decision Making Amount and/or Complexity of Data Reviewed Labs: ordered. Radiology: ordered.  Risk Prescription drug management.   Patient with acute onset of periumbilical abdominal pain.  And also  right lower quadrant.  Concerns would be of course need to rule out appendicitis.  Also need to rule out recurrent diverticulitis.  Also due to her prior resections from complications of diverticulitis patient certainly could have bowel obstruction as well.  Since she does have vomiting.  Will give pain medication IV fluids and get CT scan abdomen and pelvis.  CBC no leukocytosis.  Hemoglobin at 29.9 complete metabolic panel and lipase pending.  Patient's complete metabolic panel without any significant abnormalities.  Liver function tests were normal.  Patient's lipase was normal at 23.  CT scan abdomen and pelvis significant for bowel wall thickening involving the distal ascending colon concerning for colitis  secondary to infectious or inflammatory etiology.  They are recommending follow-up colonoscopy.  Patient used to be followed by [facility] gastroenterology.  No evidence of any diverticulitis no evidence of any complicating factors like abscess or fistulous.  Based on patient's past problem we will go ahead and probably treat her with an antibiotic for 7 days.  Have patient follow-up initially with primary care provider.  And then also follow-up with gastroenterology.  Final Clinical Impression(s) / ED Diagnoses Final diagnoses:  Right lower quadrant abdominal pain    Rx / DC Orders ED Discharge Orders     None         Fredia Sorrow, MD 12/22/21 1119    Fredia Sorrow, MD 12/22/21 [phone]

## 2022-02-01 ENCOUNTER — Other Ambulatory Visit: Payer: Self-pay | Admitting: Family Medicine

## 2022-02-01 DIAGNOSIS — Z1231 Encounter for screening mammogram for malignant neoplasm of breast: Secondary | ICD-10-CM

## 2022-03-10 ENCOUNTER — Ambulatory Visit
Admission: RE | Admit: 2022-03-10 | Discharge: 2022-03-10 | Disposition: A | Discharge status: Home / Self Care | Payer: Commercial Managed Care - PPO | Source: Ambulatory Visit | Attending: Family Medicine | Admitting: Family Medicine

## 2022-03-10 DIAGNOSIS — Z1231 Encounter for screening mammogram for malignant neoplasm of breast: Secondary | ICD-10-CM | POA: Diagnosis present

## 2022-12-25 ENCOUNTER — Inpatient Hospital Stay (HOSPITAL_COMMUNITY)
Admission: AD | Admit: 2022-12-25 | Discharge: 2022-12-30 | DRG: 885 | Disposition: A | Discharge status: Home / Self Care | Payer: Federal, State, Local not specified - Other | Source: Intra-hospital | Attending: Psychiatry | Admitting: Psychiatry

## 2022-12-25 ENCOUNTER — Other Ambulatory Visit: Payer: Self-pay

## 2022-12-25 ENCOUNTER — Encounter (HOSPITAL_COMMUNITY): Payer: Self-pay | Admitting: Psychiatry

## 2022-12-25 ENCOUNTER — Encounter (HOSPITAL_COMMUNITY): Payer: Self-pay | Admitting: Registered Nurse

## 2022-12-25 ENCOUNTER — Ambulatory Visit (HOSPITAL_COMMUNITY)
Admission: EM | Admit: 2022-12-25 | Discharge: 2022-12-25 | Disposition: A | Discharge status: Other Acute Inpatient Hospital | Payer: No Payment, Other | Attending: Psychiatry | Admitting: Psychiatry

## 2022-12-25 DIAGNOSIS — F17210 Nicotine dependence, cigarettes, uncomplicated: Secondary | ICD-10-CM | POA: Diagnosis present

## 2022-12-25 DIAGNOSIS — J45909 Unspecified asthma, uncomplicated: Secondary | ICD-10-CM | POA: Diagnosis present

## 2022-12-25 DIAGNOSIS — G4730 Sleep apnea, unspecified: Secondary | ICD-10-CM | POA: Diagnosis present

## 2022-12-25 DIAGNOSIS — G4700 Insomnia, unspecified: Secondary | ICD-10-CM | POA: Diagnosis present

## 2022-12-25 DIAGNOSIS — R45851 Suicidal ideations: Secondary | ICD-10-CM | POA: Diagnosis present

## 2022-12-25 DIAGNOSIS — F909 Attention-deficit hyperactivity disorder, unspecified type: Secondary | ICD-10-CM | POA: Diagnosis present

## 2022-12-25 DIAGNOSIS — F332 Major depressive disorder, recurrent severe without psychotic features: Principal | ICD-10-CM | POA: Diagnosis present

## 2022-12-25 DIAGNOSIS — F411 Generalized anxiety disorder: Secondary | ICD-10-CM | POA: Diagnosis present

## 2022-12-25 DIAGNOSIS — F1090 Alcohol use, unspecified, uncomplicated: Secondary | ICD-10-CM | POA: Diagnosis present

## 2022-12-25 DIAGNOSIS — Z9851 Tubal ligation status: Secondary | ICD-10-CM

## 2022-12-25 DIAGNOSIS — F410 Panic disorder [episodic paroxysmal anxiety] without agoraphobia: Secondary | ICD-10-CM | POA: Diagnosis present

## 2022-12-25 DIAGNOSIS — E569 Vitamin deficiency, unspecified: Secondary | ICD-10-CM | POA: Diagnosis present

## 2022-12-25 DIAGNOSIS — Z888 Allergy status to other drugs, medicaments and biological substances status: Secondary | ICD-10-CM | POA: Diagnosis not present

## 2022-12-25 DIAGNOSIS — Z5986 Financial insecurity: Secondary | ICD-10-CM | POA: Diagnosis not present

## 2022-12-25 DIAGNOSIS — Z9079 Acquired absence of other genital organ(s): Secondary | ICD-10-CM | POA: Diagnosis not present

## 2022-12-25 DIAGNOSIS — Z1152 Encounter for screening for COVID-19: Secondary | ICD-10-CM | POA: Insufficient documentation

## 2022-12-25 DIAGNOSIS — F1010 Alcohol abuse, uncomplicated: Secondary | ICD-10-CM | POA: Diagnosis not present

## 2022-12-25 DIAGNOSIS — Z885 Allergy status to narcotic agent status: Secondary | ICD-10-CM | POA: Diagnosis not present

## 2022-12-25 DIAGNOSIS — Z87891 Personal history of nicotine dependence: Secondary | ICD-10-CM

## 2022-12-25 DIAGNOSIS — L930 Discoid lupus erythematosus: Secondary | ICD-10-CM | POA: Diagnosis present

## 2022-12-25 DIAGNOSIS — F322 Major depressive disorder, single episode, severe without psychotic features: Secondary | ICD-10-CM | POA: Diagnosis not present

## 2022-12-25 LAB — POCT URINE DRUG SCREEN - MANUAL ENTRY (I-SCREEN)
POC Amphetamine UR: NOT DETECTED
POC Buprenorphine (BUP): NOT DETECTED
POC Cocaine UR: NOT DETECTED
POC Marijuana UR: POSITIVE — AB
POC Methadone UR: NOT DETECTED
POC Methamphetamine UR: NOT DETECTED
POC Morphine: NOT DETECTED
POC Oxazepam (BZO): NOT DETECTED
POC Oxycodone UR: NOT DETECTED
POC Secobarbital (BAR): NOT DETECTED

## 2022-12-25 LAB — URINALYSIS, COMPLETE (UACMP) WITH MICROSCOPIC
Bilirubin Urine: NEGATIVE
Glucose, UA: NEGATIVE mg/dL
Ketones, ur: NEGATIVE mg/dL
Nitrite: NEGATIVE
Protein, ur: 100 mg/dL — AB
Specific Gravity, Urine: 1.016 (ref 1.005–1.030)
pH: 5 (ref 5.0–8.0)

## 2022-12-25 LAB — COMPREHENSIVE METABOLIC PANEL
ALT: 18 U/L (ref 0–44)
AST: 28 U/L (ref 15–41)
Albumin: 4.4 g/dL (ref 3.5–5.0)
Alkaline Phosphatase: 89 U/L (ref 38–126)
Anion gap: 14 (ref 5–15)
BUN: 7 mg/dL (ref 6–20)
CO2: 22 mmol/L (ref 22–32)
Calcium: 9 mg/dL (ref 8.9–10.3)
Chloride: 104 mmol/L (ref 98–111)
Creatinine, Ser: 0.6 mg/dL (ref 0.44–1.00)
GFR, Estimated: >60 mL/min (ref >60)
Glucose, Bld: 77 mg/dL (ref 70–99)
Potassium: 3.5 mmol/L (ref 3.5–5.1)
Sodium: 140 mmol/L (ref 135–145)
Total Bilirubin: 0.8 mg/dL (ref 0.3–1.2)
Total Protein: 7.5 g/dL (ref 6.5–8.1)

## 2022-12-25 LAB — RESP PANEL BY RT-PCR (RSV, FLU A&B, COVID)  RVPGX2
Influenza A by PCR: NEGATIVE
Influenza B by PCR: NEGATIVE
Resp Syncytial Virus by PCR: NEGATIVE
SARS Coronavirus 2 by RT PCR: NEGATIVE

## 2022-12-25 LAB — CBC WITH DIFFERENTIAL/PLATELET
Abs Immature Granulocytes: 0.02 10*3/uL (ref 0.00–0.07)
Basophils Absolute: 0 10*3/uL (ref 0.0–0.1)
Basophils Relative: 1 %
Eosinophils Absolute: 0 10*3/uL (ref 0.0–0.5)
Eosinophils Relative: 1 %
HCT: 46.9 % — ABNORMAL HIGH (ref 36.0–46.0)
Hemoglobin: 16 g/dL — ABNORMAL HIGH (ref 12.0–15.0)
Immature Granulocytes: 0 %
Lymphocytes Relative: 23 %
Lymphs Abs: 1.8 10*3/uL (ref 0.7–4.0)
MCH: 31.4 pg (ref 26.0–34.0)
MCHC: 34.1 g/dL (ref 30.0–36.0)
MCV: 92 fL (ref 80.0–100.0)
Monocytes Absolute: 0.6 10*3/uL (ref 0.1–1.0)
Monocytes Relative: 8 %
Neutro Abs: 5.4 10*3/uL (ref 1.7–7.7)
Neutrophils Relative %: 67 %
Platelets: 254 10*3/uL (ref 150–400)
RBC: 5.1 MIL/uL (ref 3.87–5.11)
RDW: 12.9 % (ref 11.5–15.5)
WBC: 7.9 10*3/uL (ref 4.0–10.5)
nRBC: 0 % (ref 0.0–0.2)

## 2022-12-25 LAB — HEMOGLOBIN A1C
Hgb A1c MFr Bld: 5.2 % (ref 4.8–5.6)
Mean Plasma Glucose: 102.54 mg/dL

## 2022-12-25 LAB — LIPID PANEL
Cholesterol: 223 mg/dL — ABNORMAL HIGH (ref 0–200)
HDL: 128 mg/dL (ref >40)
LDL Cholesterol: 84 mg/dL (ref 0–99)
Total CHOL/HDL Ratio: 1.7 RATIO
Triglycerides: 57 mg/dL (ref <150)
VLDL: 11 mg/dL (ref 0–40)

## 2022-12-25 LAB — TSH: TSH: 1.061 u[IU]/mL (ref 0.350–4.500)

## 2022-12-25 LAB — POC SARS CORONAVIRUS 2 AG: SARSCOV2ONAVIRUS 2 AG: NEGATIVE

## 2022-12-25 LAB — POC URINE PREG, ED: Preg Test, Ur: NEGATIVE

## 2022-12-25 LAB — ETHANOL: Alcohol, Ethyl (B): 188 mg/dL — ABNORMAL HIGH (ref <10)

## 2022-12-25 LAB — MAGNESIUM: Magnesium: 2 mg/dL (ref 1.7–2.4)

## 2022-12-25 MED ORDER — ADULT MULTIVITAMIN W/MINERALS CH
1.0000 | ORAL_TABLET | Freq: Every day | ORAL | Status: DC
  Administered 2022-12-25: 1 via ORAL
  Filled 2022-12-25: qty 1

## 2022-12-25 MED ORDER — HYDROXYZINE HCL 25 MG PO TABS: 25.0000 mg | ORAL_TABLET | Freq: Three times a day (TID) | ORAL | Status: DC | PRN

## 2022-12-25 MED ORDER — TRAZODONE HCL 50 MG PO TABS: 50.0000 mg | ORAL_TABLET | Freq: Every evening | ORAL | Status: DC | PRN

## 2022-12-25 MED ORDER — LOPERAMIDE HCL 2 MG PO CAPS: 2.0000 mg | ORAL_CAPSULE | ORAL | Status: AC | PRN

## 2022-12-25 MED ORDER — TRAZODONE HCL 50 MG PO TABS: 100.0000 mg | ORAL_TABLET | Freq: Every evening | ORAL | Status: DC | PRN

## 2022-12-25 MED ORDER — THIAMINE MONONITRATE 100 MG PO TABS: 100.0000 mg | ORAL_TABLET | Freq: Every day | ORAL | Status: DC

## 2022-12-25 MED ORDER — LORAZEPAM 1 MG PO TABS: 1.0000 mg | ORAL_TABLET | Freq: Three times a day (TID) | ORAL | Status: DC

## 2022-12-25 MED ORDER — LORAZEPAM 1 MG PO TABS: 1.0000 mg | ORAL_TABLET | Freq: Every day | ORAL | Status: DC

## 2022-12-25 MED ORDER — VITAMIN B-1 100 MG PO TABS
100.0000 mg | ORAL_TABLET | Freq: Every day | ORAL | Status: DC
  Administered 2022-12-26 – 2022-12-30 (×5): 100 mg via ORAL
  Filled 2022-12-25: qty 1
  Filled 2022-12-25: qty 7
  Filled 2022-12-25 (×5): qty 1

## 2022-12-25 MED ORDER — LAMOTRIGINE 25 MG PO TABS
25.0000 mg | ORAL_TABLET | Freq: Every day | ORAL | Status: DC
  Administered 2022-12-26 – 2022-12-30 (×5): 25 mg via ORAL
  Filled 2022-12-25 (×6): qty 1
  Filled 2022-12-25: qty 7

## 2022-12-25 MED ORDER — LOPERAMIDE HCL 2 MG PO CAPS: 2.0000 mg | ORAL_CAPSULE | ORAL | Status: DC | PRN

## 2022-12-25 MED ORDER — TRAZODONE HCL 50 MG PO TABS
50.0000 mg | ORAL_TABLET | Freq: Every evening | ORAL | Status: DC | PRN
  Administered 2022-12-25 – 2022-12-29 (×5): 50 mg via ORAL
  Filled 2022-12-25 (×5): qty 1

## 2022-12-25 MED ORDER — ACETAMINOPHEN 325 MG PO TABS: 650.0000 mg | ORAL_TABLET | Freq: Four times a day (QID) | ORAL | Status: DC | PRN

## 2022-12-25 MED ORDER — THIAMINE HCL 100 MG/ML IJ SOLN
100.0000 mg | Freq: Once | INTRAMUSCULAR | Status: AC
  Administered 2022-12-25: 100 mg via INTRAMUSCULAR
  Filled 2022-12-25: qty 2

## 2022-12-25 MED ORDER — ALUM & MAG HYDROXIDE-SIMETH 200-200-20 MG/5ML PO SUSP: 30.0000 mL | ORAL | Status: DC | PRN

## 2022-12-25 MED ORDER — LORAZEPAM 1 MG PO TABS
1.0000 mg | ORAL_TABLET | Freq: Two times a day (BID) | ORAL | Status: AC
  Administered 2022-12-28 (×2): 1 mg via ORAL
  Filled 2022-12-25 (×2): qty 1

## 2022-12-25 MED ORDER — LORAZEPAM 1 MG PO TABS
1.0000 mg | ORAL_TABLET | Freq: Every day | ORAL | Status: AC
  Filled 2022-12-25: qty 1

## 2022-12-25 MED ORDER — LORAZEPAM 1 MG PO TABS: 1.0000 mg | ORAL_TABLET | Freq: Four times a day (QID) | ORAL | Status: DC | PRN

## 2022-12-25 MED ORDER — ADULT MULTIVITAMIN W/MINERALS CH
1.0000 | ORAL_TABLET | Freq: Every day | ORAL | Status: DC
  Administered 2022-12-26 – 2022-12-30 (×5): 1 via ORAL
  Filled 2022-12-25 (×6): qty 1

## 2022-12-25 MED ORDER — PROGESTERONE 200 MG PO CAPS
200.0000 mg | ORAL_CAPSULE | Freq: Every day | ORAL | Status: DC
  Administered 2022-12-26 – 2022-12-30 (×5): 200 mg via ORAL
  Filled 2022-12-25 (×6): qty 1

## 2022-12-25 MED ORDER — MAGNESIUM HYDROXIDE 400 MG/5ML PO SUSP: 30.0000 mL | Freq: Every day | ORAL | Status: DC | PRN

## 2022-12-25 MED ORDER — ONDANSETRON 4 MG PO TBDP: 4.0000 mg | ORAL_TABLET | Freq: Four times a day (QID) | ORAL | Status: DC | PRN

## 2022-12-25 MED ORDER — LORAZEPAM 1 MG PO TABS
1.0000 mg | ORAL_TABLET | Freq: Four times a day (QID) | ORAL | Status: DC
  Administered 2022-12-25: 1 mg via ORAL
  Filled 2022-12-25: qty 1

## 2022-12-25 MED ORDER — LORAZEPAM 1 MG PO TABS
1.0000 mg | ORAL_TABLET | Freq: Three times a day (TID) | ORAL | Status: AC
  Administered 2022-12-27 (×3): 1 mg via ORAL
  Filled 2022-12-25 (×2): qty 1

## 2022-12-25 MED ORDER — ACETAMINOPHEN 325 MG PO TABS
650.0000 mg | ORAL_TABLET | Freq: Four times a day (QID) | ORAL | Status: DC | PRN
  Administered 2022-12-26 – 2022-12-28 (×4): 650 mg via ORAL
  Filled 2022-12-25 (×4): qty 2

## 2022-12-25 MED ORDER — SERTRALINE HCL 50 MG PO TABS
50.0000 mg | ORAL_TABLET | Freq: Every day | ORAL | Status: DC
  Administered 2022-12-26: 50 mg via ORAL
  Filled 2022-12-25 (×3): qty 1

## 2022-12-25 MED ORDER — LORAZEPAM 1 MG PO TABS: 1.0000 mg | ORAL_TABLET | Freq: Four times a day (QID) | ORAL | Status: AC | PRN

## 2022-12-25 MED ORDER — ONDANSETRON 4 MG PO TBDP: 4.0000 mg | ORAL_TABLET | Freq: Four times a day (QID) | ORAL | Status: AC | PRN

## 2022-12-25 MED ORDER — LORAZEPAM 1 MG PO TABS: 1.0000 mg | ORAL_TABLET | Freq: Two times a day (BID) | ORAL | Status: DC

## 2022-12-25 MED ORDER — ALBUTEROL SULFATE HFA 108 (90 BASE) MCG/ACT IN AERS
2.0000 | INHALATION_SPRAY | RESPIRATORY_TRACT | Status: DC | PRN
  Administered 2022-12-29 – 2022-12-30 (×2): 2 via RESPIRATORY_TRACT
  Filled 2022-12-25: qty 6.7

## 2022-12-25 MED ORDER — LORAZEPAM 1 MG PO TABS
1.0000 mg | ORAL_TABLET | Freq: Four times a day (QID) | ORAL | Status: AC
  Administered 2022-12-25 – 2022-12-26 (×4): 1 mg via ORAL
  Filled 2022-12-25 (×4): qty 1

## 2022-12-25 MED ORDER — HYDROXYZINE HCL 25 MG PO TABS
25.0000 mg | ORAL_TABLET | Freq: Four times a day (QID) | ORAL | Status: DC | PRN
  Administered 2022-12-27: 25 mg via ORAL
  Filled 2022-12-25: qty 1

## 2022-12-25 MED ORDER — HYDROXYZINE HCL 25 MG PO TABS: 25.0000 mg | ORAL_TABLET | Freq: Four times a day (QID) | ORAL | Status: DC | PRN

## 2022-12-25 NOTE — ED Notes (Signed)
Called report to [facility] for this patient who can arrive anytime after 9 pm.

## 2022-12-25 NOTE — ED Provider Notes (Addendum)
Behavioral Health Urgent Care Medical Screening Exam  Patient Name: [name] MRN: [id number] Date of Evaluation: 12/25/22 Chief Complaint:  " I know I need help things have just gotten so bad, I was thinking of killing myself earlier". Diagnosis:  Final diagnoses:  Current severe episode of major depressive disorder without psychotic features, unspecified whether recurrent ([place])  Alcohol use disorder    History of Present illness: [name] is a 57 y.o. female patient presented to [facility] as a walk in along with complaints of " I know I need help things have just gotten so bad, I was thinking of killing myself earlier".  Presents at the recommendation of her psychiatrist Dr. Altamese Hominy.  [name], 57 y.o., female patient seen face to face by this provider and chart reviewed on 12/25/22.  Per chart review patient has past psychiatric history of MDD, ADHD, alcohol use disorder, and anxiety.  Her psychiatric provider is Dr. Altamese [place] with [facility].  She is currently prescribed Lamictal, Prozac, BuSpar.  She has taken Latuda in the past but states it made her feel numb.  She has not taken any medications in the past month.  She was admitted to [facility] on 01/2022 for SI.  However she denies having any previous SI attempts.  She has participated in residential substance abuse treatment in the past.  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    On evaluation [name] reports she has a history of alcohol use disorder and has been sober for 6 months.  She went through a divorce in July.  Reports since that time her depression and anxiety have increased.  Roughly 1 month ago she relapsed on alcohol.  She also stopped taking her medications during that time.  Reports her alcohol use has increased over the past 4-5 days and she is drinking roughly 1/5 of vodka in 2 days and about 2-3 500 mL of wine a day.  Last drink was 2 hours ago.  She has a fine hand tremor, but denies any other  withdrawal symptoms at this time.  She denies any history of alcohol withdrawal seizure or delirium tremors.  During evaluation [name] is observed sitting in the assessment room in no acute distress.  She is disheveled and makes good eye contact.  She is alert/oriented x 4, cooperative, and attentive.  She is tearful throughout the assessment and appears anxious.  She endorses an increase in her depression and anxiety with feelings of helplessness, hopelessness, decreased motivation, self-isolation, worthlessness and guilt.  She has a depressed affect.  She denies HI/AVH.  She does not appear to be responding to internal stimuli.  She does not appear manic or psychotic.  She endorses suicidal ideations.  Earlier today she was staring at a bottle of her old sleep medication doxepin.  She was prescribed medication in the past and it had dropped her heart rate so she stopped taking it but still has the old medication.  She considered taking this medication because she felt that it would be effective in completing suicide.  She talked/reasoned her way through it.  She called her psychiatric provider Dr. Altamese [place] who recommended for her to come to [facility] for assessment.  She continues to endorse suicidal ideations but states at this time she does not have intent but still endorses a plan to overdose.  States, "I do not trust myself I am too impulsive at home when these moments hit".  She cannot verbally contract for safety.    Discussed inpatient psychiatric admission and patient is agreeable.  Reports Lamictal was helpful for mood stabilization and she is willing to restart that medication.  She does not want to start Prozac she would like to try something different.  Discussed starting Zoloft and patient agrees.  [facility] ED from 12/25/2022 in [facility] ED from 12/22/2021 in [facility] Emergency Department at [facility] Admission (Discharged) from 01/05/2021 in  [facility]  C-SSRS RISK CATEGORY Moderate Risk No Risk Low Risk       Psychiatric Specialty Exam  Presentation  General Appearance:Disheveled  Eye Contact:Good  Speech:Clear and Coherent; Normal Rate  Speech Volume:Normal  Handedness:Right   Mood and Affect  Mood: Anxious; Depressed; Hopeless; Worthless  Affect: Tearful; Congruent   Thought Process  Thought Processes: Coherent  Descriptions of Associations:Intact  Orientation:Full (Time, Place and Person)  Thought Content:Logical  Diagnosis of Schizophrenia or Schizoaffective disorder in past: No   Hallucinations:None  Ideas of Reference:None  Suicidal Thoughts:Yes, Passive With Plan; Without Intent; With Means to Carry Out  Homicidal Thoughts:No   Sensorium  Memory: Immediate Good; Recent Good; Remote Good  Judgment: Fair  Insight: Good   Executive Functions  Concentration: Good  Attention Span: Good  Recall: Good  Fund of Knowledge: Good  Language: Good   Psychomotor Activity  Psychomotor Activity: Normal   Assets  Assets: Communication Skills; Desire for Improvement; Financial Resources/Insurance; Housing; Leisure Time; Physical Health; Resilience; Social Support; Vocational/Educational   Sleep  Sleep: Poor  Number of hours: No data recorded  Physical Exam: Physical Exam Vitals and nursing note reviewed.  Constitutional:      General: She is not in acute distress.    Appearance: Normal appearance. She is not ill-appearing.  HENT:     Head: Normocephalic.  Eyes:     General:        Right eye: No discharge.        Left eye: No discharge.     Conjunctiva/sclera: Conjunctivae normal.  Cardiovascular:     Rate and Rhythm: Normal rate.  Pulmonary:     Effort: Pulmonary effort is normal. No respiratory distress.  Musculoskeletal:        General: Normal range of motion.     Cervical back: Normal range of motion.  Skin:     General: Skin is warm.     Coloration: Skin is not jaundiced or pale.  Neurological:     Mental Status: She is alert and oriented to person, place, and time.  Psychiatric:        Attention and Perception: Attention and perception normal.        Mood and Affect: Mood is anxious and depressed. Affect is tearful.        Speech: Speech normal.        Behavior: Behavior normal. Behavior is cooperative.        Thought Content: Thought content includes suicidal ideation. Thought content includes suicidal plan.        Cognition and Memory: Cognition normal.        Judgment: Judgment normal.    Review of Systems  Constitutional: Negative.   HENT: Negative.    Eyes: Negative.   Respiratory: Negative.    Cardiovascular: Negative.   Musculoskeletal: Negative.   Skin: Negative.   Neurological: Negative.   Psychiatric/Behavioral:  Positive for depression, substance abuse and suicidal ideas. The patient is nervous/anxious.  Blood pressure (!) 147/94, pulse 100, temperature 98.2 F (36.8 C), temperature source Oral, resp. rate 18, SpO2 97 %. There is no height or weight on file to calculate BMI.  Musculoskeletal: Strength & Muscle Tone: within normal limits Gait & Station: normal Patient leans: N/A   [place] M[facility] Discharge Disposition for Follow up and Recommendations: Based on my evaluation I certify that psychiatric inpatient services furnished can reasonably be expected to improve the patient's condition which I recommend transfer to an appropriate accepting facility.   Patient meets criteria for inpatient psychiatric admission.  [facility] notified and patient has been accepted for 12/25/2022 at 9 PM.  CIWA protocol initiated with Ativan Taper.  Will start at Lamictal at 25 mg daily and Zoloft 50 mg daily with first dose tomorrow.   Will add ProAir as needed.  Patient states she takes Advair twice daily but medications have not been verified by pharmacy at this time.  Lab Orders          Resp panel by RT-PCR (RSV, Flu A&B, Covid) Anterior Nasal Swab         CBC with Differential/Platelet         Comprehensive metabolic panel         Hemoglobin A1c         Magnesium         Ethanol         Lipid panel         TSH         RPR         Urinalysis, Complete w Microscopic         POCT Urine Drug Screen - (I-Screen)         POC urine preg, ED      EKG  Revonda Humphrey, NP 12/25/2022, 4:40 PM

## 2022-12-25 NOTE — ED Notes (Signed)
Pt is resting at this hour. No apparent distress. RR even and unlabored. Pt wants help for her depression. Stopped taking her medications d/t not being able to pay the insurance premiums. Pt stated she would restart her medications if she was able to afford them. Pt contracts for safety. Will be transferred to [facility] after 9 pm. Monitored for safety.

## 2022-12-25 NOTE — Progress Notes (Signed)
   12/25/22 1630  [place] (Walk-ins at [facility] only)  How Did You Hear About [place]? Other (Comment)  What Is the Reason for Your Visit/Call Today? [name] is a 57 year old female presenting to [facility] voluntarily with chief complaint of "I'm suicidal and an alcoholic". Patient reports she was in treatment at [facility] in [place] from July to September and afterwards she went to [facility] recovery. Patient reports worsening depression and relapsing about a month ago. Patient reports stressors of a divorce in July while going through treatment. Patient reports at first she was drinking a little bit, however for the last 4 days patient reports drinking about a fifth of vodka in two days and about 2-3 500 ml of wine a day. Patient currently reports passive SI reporting "I don't have anything else". However, patient reports having thoughts about taking her sleep medications but decided not to because she did not want her family to find her. Patient reports drinking vodka she had left over from the other day, some wine about two hours ago and was planning on ordering more through [facility]. Patient reports she called her psychiatrist Dr. Altamese  who encouraged her to come in today. Patient does not have therapy services she denies prior suicide attempts and she has a history of inpatient treatment.  Patient denies HI, AVH.  How Long Has This Been Causing You Problems? 1-6 months  Have You Recently Had Any Thoughts About Hurting Yourself? Yes  Are You Planning to Commit Suicide/Harm Yourself At This time? No  Have you Recently Had Thoughts About [place]? No  Are You Planning To Harm Someone At This Time? No  Are you currently experiencing any auditory, visual or other hallucinations? No  Have You Used Any Alcohol or Drugs in the Past 24 Hours? Yes  Do you have any current medical co-morbidities that require immediate attention? No  Clinician description of patient physical  appearance/behavior: tearful  What Do You Feel Would Help You the Most Today? Treatment for Depression or other mood problem;Alcohol or Drug Use Treatment  If access to [facility] was not available, would you have sought care in the Emergency Department? No  Determination of Need Urgent (48 hours)  Options For Referral Outpatient Therapy;Inpatient Hospitalization;Facility-Based Crisis

## 2022-12-25 NOTE — ED Notes (Signed)
[facility] lab courier called to transport labs to [facility] lab 

## 2022-12-25 NOTE — BH Assessment (Signed)
Comprehensive Clinical Assessment (CCA) Note  12/25/2022 [name] [id number]  Disposition: Per Thomes Lolling, NP, patient is recommended for inpatient treatment.  The patient demonstrates the following risk factors for suicide: Chronic risk factors for suicide include: psychiatric disorder of MDD and substance use disorder. Acute risk factors for suicide include: family or marital conflict. Protective factors for this patient include: positive therapeutic relationship and responsibility to others (children, family). Considering these factors, the overall suicide risk at this point appears to be moderate. Patient is not appropriate for outpatient follow up.  Chief Complaint:  Chief Complaint  Patient presents with   Suicidal   Depression   Visit Diagnosis:  Current severe episode of major depressive disorder without psychotic features, unspecified whether recurrent ([name])  Alcohol use disorder      CCA Screening, Triage and Referral (STR)  Patient Reported Information How did you hear about [place]? Other (Comment)  What Is the Reason for Your Visit/Call Today? [name] is a 57 year old female presenting to [facility] voluntarily with chief complaint of "I'm suicidal and an alcoholic". Patient reports she was in treatment at [facility] in [place] from July to September and afterwards she went to [facility] recovery. Patient reports worsening depression and relapsing about a month ago. Patient reports stressors of a divorce in July while going through treatment. Patient reports at first she was drinking a little bit, however for the last 4 days patient reports drinking about a fifth of vodka in two days and about 2-3 500 ml of wine a day. Patient currently reports passive SI reporting "I don't have anything else". However, patient reports having thoughts about taking her sleep medications but decided not to because she did not want her family to find her. Patient reports drinking vodka  she had left over from the other day, some wine about two hours ago and was planning on ordering more through [facility]. Patient reports she called her psychiatrist [name] who encouraged her to come in today. Patient does not have therapy services she denies prior suicide attempts and she has a history of inpatient treatment.  Patient denies HI, AVH.  Patient is oriented x4, engaged alert and cooperative. Patient eye contact and speech is normal, her thoughts are linear. Patient is tearful her affect is depressed with congruent mood. Patient denies current withdrawal symptoms however reports hands shaking this morning and feeling anxious. Patient reports passive SI, no current plan. Patient is seeking help voluntarily and agrees to go inpatient.   How Long Has This Been Causing You Problems? 1-6 months  What Do You Feel Would Help You the Most Today? Treatment for Depression or other mood problem; Alcohol or Drug Use Treatment   Have You Recently Had Any Thoughts About Hurting Yourself? Yes  Are You Planning to Commit Suicide/Harm Yourself At This time? No   Flowsheet Row ED from 12/25/2022 in [facility] ED from 12/22/2021 in [facility] Department [facility] Admission (Discharged) from 01/05/2021 in [facility]  C-SSRS RISK CATEGORY Moderate Risk No Risk Low Risk       Have you Recently Had Thoughts About [place]? No  Are You Planning to Harm Someone at This Time? No  Explanation: NA   Have You Used Any Alcohol or Drugs in the Past 24 Hours? Yes  What Did You Use and How Much? SOME VODKA THIS MORNING AND 500ML OF WINE TWO HOURS AGO  Do You Currently Have a Therapist/Psychiatrist? Yes  Name of Therapist/Psychiatrist: Name of Therapist/Psychiatrist: DR. IZZY   Have You Been Recently Discharged From Any Office Practice or Programs? No  Explanation of Discharge From Practice/Program:  NA     CCA Screening Triage Referral Assessment Type of Contact: Face-to-Face  Telemedicine Service Delivery:   Is this Initial or Reassessment?   Date Telepsych consult ordered in [facility]:    Time Telepsych consult ordered in [facility]:    Location of Assessment: [facility] Assessment Services  Provider Location: [facility] Assessment Services   Collateral Involvement: NONE   Does Patient Have a Stage manager Guardian? No  Legal Guardian Contact Information: NA  Copy of Legal Guardianship Form: -- (NA)  Legal Guardian Notified of Arrival: -- (NA)  Legal Guardian Notified of Pending Discharge: -- (NA)  If Minor and Not Living with Parent(s), Who has Custody? NA  Is CPS involved or ever been involved? Never  Is APS involved or ever been involved? Never   Patient Determined To Be At Risk for Harm To Self or Others Based on Review of Patient Reported Information or Presenting Complaint? Yes, for Self-Harm  Method: No Plan  Availability of Means: No access or NA  Intent: Vague intent or NA  Notification Required: No need or identified person  Additional Information for Danger to Others Potential: -- (PT DENIES PREVIOUS ATTEMPTS)  Additional Comments for Danger to Others Potential: NA  Are There Guns or Other Weapons in [place]? No  Types of Guns/Weapons: NA  Are These Weapons Safely Secured?                            -- (NA)  Who Could Verify You Are Able To Have These Secured: NA  Do You Have any Outstanding Charges, Pending Court Dates, Parole/Probation? NO  Contacted To Inform of Risk of Harm To Self or Others: -- (NONE)    Does Patient Present under Involuntary Commitment? No    [place] of Residence: [place]   Patient Currently Receiving the Following Services: Medication Management   Determination of Need: Urgent (48 hours)   Options For Referral: Outpatient Therapy; Inpatient Hospitalization; Facility-Based Crisis     CCA  Biopsychosocial Patient Reported Schizophrenia/Schizoaffective Diagnosis in Past: No   Strengths: unk   Mental Health Symptoms Depression:   Hopelessness; Fatigue; Change in energy/activity; Increase/decrease in appetite; Irritability; Sleep (too much or little); Worthlessness; Tearfulness   Duration of Depressive symptoms:  Duration of Depressive Symptoms: Greater than two weeks   Mania:   None   Anxiety:    Irritability   Psychosis:   None   Duration of Psychotic symptoms:    Trauma:   None   Obsessions:   N/A   Compulsions:   None   Inattention:  No data recorded  Hyperactivity/Impulsivity:   N/A   Oppositional/Defiant Behaviors:   None   Emotional Irregularity:   Chronic feelings of emptiness   Other Mood/Personality Symptoms:  No data recorded   Mental Status Exam Appearance and self-care  Stature:   Average   Weight:   Average weight   Clothing:   Neat/clean   Grooming:   Normal   Cosmetic use:   None   Posture/gait:   Normal   Motor activity:   Not Remarkable   Sensorium  Attention:   Normal   Concentration:   Normal   Orientation:   X5   Recall/memory:  Normal   Affect and Mood  Affect:   Depressed; Tearful   Mood:   Depressed   Relating  Eye contact:   Normal   Facial expression:   Depressed   Attitude toward examiner:   Cooperative   Thought and Language  Speech flow:  Clear and Coherent   Thought content:   Appropriate to Mood and Circumstances   Preoccupation:   Guilt   Hallucinations:   None   Organization:   Goal-directed   Transport planner of Knowledge:   Average   Intelligence:   Average   Abstraction:   Normal   Judgement:   Impaired   Reality Testing:   Unaware   Insight:   Fair   Decision Making:   Normal   Social Functioning  Social Maturity:   Responsible   Social Judgement:   Normal   Stress  Stressors:   Relationship (DIVORCE IN [place])    Coping Ability:   Normal   Skill Deficits:   None   Supports:   Other (Comment); Support needed     Religion: Religion/Spirituality Are You A Religious Person?: No How Might This Affect Treatment?: NA  Leisure/Recreation: Leisure / Recreation Do You Have Hobbies?: No  Exercise/Diet: Exercise/Diet Do You Exercise?: No Have You Gained or Lost A Significant Amount of Weight in the Past Six Months?: No Do You Follow a Special Diet?: No Do You Have Any Trouble Sleeping?: No   CCA Employment/Education Employment/Work Situation: Employment / Work Situation Employment Situation: Employed Work Stressors: [employer] [place] Patient's Job has Been Impacted by Current Illness: Yes Has Patient ever Been in Passenger transport manager?: No  Education: Education Is Patient Currently Attending School?: No Did Physicist, medical?: Yes What Type of College Degree Do you Have?: 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 Did You Have An Individualized Education Program (IIEP): No Did You Have Any Difficulty At School?: No Patient's Education Has Been Impacted by Current Illness: No   CCA Family/Childhood History Family and Relationship History: Family history Marital status: Divorced Divorced, when?: 2023 What types of issues is patient dealing with in the relationship?: UNKNOWN Additional relationship information: NA Does patient have children?: Yes How many children?: 2 How is patient's relationship with their children?: "GOOD"  Childhood History:  Childhood History By whom was/is the patient raised?: Both parents Did patient suffer any verbal/emotional/physical/sexual abuse as a child?: No Did patient suffer from severe childhood neglect?: No Has patient ever been sexually abused/assaulted/raped as an adolescent or adult?: No Was the patient ever a victim of a crime or a disaster?: No Witnessed domestic violence?: No Has patient been affected by  domestic violence as an adult?: No       CCA Substance Use Alcohol/Drug Use: Alcohol / Drug Use Pain Medications: SEE MAR Prescriptions: SEE MAR Over the Counter: SEE MAR History of alcohol / drug use?: Yes Longest period of sobriety (when/how long): 6 months Negative Consequences of Use: Financial, Legal, Personal relationships, Work / Youth worker (DUI obtained in 2020) Withdrawal Symptoms: Tremors, Irritability Substance #1 Name of Substance 1: ALCOHOL 1 - Age of First Use: 38 1 - Amount (size/oz): 2-3 500ML OF WINE 1 - Frequency: DAILY 1 - Duration: ONGOING 1 - Last Use / Amount: 12/25/22 500ML OF WINE 1 - Method of Aquiring: STORE 1- Route of Use: ORAL  ASAM's:  Six Dimensions of Multidimensional Assessment  Dimension 1:  Acute Intoxication and/or Withdrawal Potential:      Dimension 2:  Biomedical Conditions and Complications:      Dimension 3:  Emotional, Behavioral, or Cognitive Conditions and Complications:     Dimension 4:  Readiness to Change:     Dimension 5:  Relapse, Continued use, or Continued Problem Potential:     Dimension 6:  Recovery/Living Environment:     ASAM Severity Score:    ASAM Recommended Level of Treatment: ASAM Recommended Level of Treatment: Level II Intensive Outpatient Treatment   Substance use Disorder (SUD) Substance Use Disorder (SUD)  Checklist Symptoms of Substance Use: Persistent desire or unsuccessful efforts to cut down or control use  Recommendations for Services/Supports/Treatments: Recommendations for Services/Supports/Treatments Recommendations For Services/Supports/Treatments: Individual Therapy, Facility Based Crisis, Detox  Discharge Disposition: Discharge Disposition Medical Exam completed: Yes Disposition of Patient: Admit Mode of transportation if patient is discharged/movement?: Car  DSM5 Diagnoses: Patient Active Problem List   Diagnosis Date Noted   Depression, unspecified  01/06/2021   Alcohol use 01/06/2021   Insomnia 06/18/2020   SUI (stress urinary incontinence, female)    Hypertension    History of anemia    GERD (gastroesophageal reflux disease)    History of alcoholism ([place]) 02/25/2020   Intractable migraine with aura without status migrainosus 02/25/2020   Attention deficit hyperactivity disorder (ADHD), predominantly inattentive type 01/07/2020   GAD (generalized anxiety disorder) 01/07/2020   Diverticulitis with abscess/fistula s/p robotic left colectomy 06/16/2020 09/06/2019   MDD (major depressive disorder) 10/07/2015     Referrals to Alternative Service(s): Referred to Alternative Service(s):   Place:   Date:   Time:    Referred to Alternative Service(s):   Place:   Date:   Time:    Referred to Alternative Service(s):   Place:   Date:   Time:    Referred to Alternative Service(s):   Place:   Date:   Time:     Luther Redo, [facility]

## 2022-12-25 NOTE — ED Notes (Signed)
Patient A&Ox4. Patient reports SI with no plan or intent. Patient denies HI and AVH. Patient denies any physical complaints when asked. No acute distress noted. Support and encouragement provided. Routine safety checks conducted according to facility protocol. Encouraged patient to notify staff if thoughts of harm toward self or others arise. Patient verbalize understanding and agreement. Will continue to monitor for safety.

## 2022-12-25 NOTE — Discharge Instructions (Addendum)
Transfer to [facility] for IP admissoin

## 2022-12-25 NOTE — Progress Notes (Signed)
   12/25/22 2333  Psych Admission Type (Psych Patients Only)  Admission Status Voluntary  Psychosocial Assessment  Patient Complaints Anxiety;Other (Comment);Worrying;Crying spells;Sadness (sadness due to etoh relaspe)  Eye Contact Brief  Facial Expression Anxious;Sad  Affect Anxious;Sad  Speech Logical/coherent  Interaction Other (Comment);Minimal  Motor Activity  (wnl)  Appearance/Hygiene Unremarkable  Behavior Characteristics Cooperative;Appropriate to situation;Anxious  Mood Anxious;Sad  Thought Process  Coherency WDL  Content WDL  Delusions None reported or observed  Perception WDL  Hallucination None reported or observed  Judgment Impaired  Confusion None  Danger to Self  Current suicidal ideation? Denies  Self-Injurious Behavior No self-injurious ideation or behavior indicators observed or expressed   Agreement Not to Harm Self Yes  Description of Agreement Verbal  Danger to Others  Danger to Others None reported or observed

## 2022-12-25 NOTE — Progress Notes (Signed)
   Initial Treatment Plan 12/25/2022 11:19 PM Abcde M. Gaulin MRN: [id number]       PATIENT STRESSORS: Job stress Financial stress Alcohol abuse       PATIENT STRENGTHS: Ability for insight  Ability to acknowledge weaknesses and seek help  Communication skills      PATIENT IDENTIFIED PROBLEMS: Prolonged hospitalization   Lost job  New job brings job Psychologist, sport and exercise issues    Alcohol abuse                    DISCHARGE CRITERIA:  Ability to meet basic life and health needs Improved stabilization in mood, thinking, and/or behavior Verbal commitment to aftercare and medication compliance   PRELIMINARY DISCHARGE PLAN: Outpatient therapy Alcohol Anonymous programs  Plans for medication compliance    PATIENT/FAMILY INVOLVEMENT: This treatment plan has been presented to and reviewed with the patient, Sandar Krinke. Dob[place] , The patient has been given the opportunity to ask questions and make suggestions.   [place] M. Sedrick Tober 12/25/2022 11:23 PM

## 2022-12-25 NOTE — Progress Notes (Addendum)
ADMISSION NOTE:    This patient is a 57 year old Caucasian female, that came voluntarily into [facility] with the complaint of SI with no plan or intent. Patient reports an increase in her depression and anxiety that has led her relapse with alcohol. Patient states that she was just in rehab for alcohol in September, and has been clean since. 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 She admits to getting divorced in July, which added to her anxiety and depression. Patient admits to binge drinking for 3 weeks straight. She reports drinking either 2-3 wine boxes that were each 540m of wine. However tonight she reports drinking a pint of vodka. Patient admits to being able to take her medication x1 month due to financial reasons. "That and the alcohol is probably why I feel the need to drink." Patient states, "I am just super shaky right now." Patient denies SI, HI, AVH at this moment. Patient contracts for safety at this time. Patient oriented to the unit. Patient made aware about unit regulations and q15 minute checks. Patient given nourishment. Patient denies any questions at this time.

## 2022-12-26 DIAGNOSIS — F1090 Alcohol use, unspecified, uncomplicated: Secondary | ICD-10-CM | POA: Diagnosis present

## 2022-12-26 DIAGNOSIS — F332 Major depressive disorder, recurrent severe without psychotic features: Secondary | ICD-10-CM | POA: Diagnosis not present

## 2022-12-26 DIAGNOSIS — J45909 Unspecified asthma, uncomplicated: Secondary | ICD-10-CM | POA: Diagnosis present

## 2022-12-26 LAB — URINALYSIS, COMPLETE (UACMP) WITH MICROSCOPIC
Bacteria, UA: NONE SEEN
Bilirubin Urine: NEGATIVE
Glucose, UA: NEGATIVE mg/dL
Hgb urine dipstick: NEGATIVE
Ketones, ur: NEGATIVE mg/dL
Leukocytes,Ua: NEGATIVE
Nitrite: NEGATIVE
Protein, ur: NEGATIVE mg/dL
Specific Gravity, Urine: 1.014 (ref 1.005–1.030)
pH: 8 (ref 5.0–8.0)

## 2022-12-26 LAB — RPR: RPR Ser Ql: NONREACTIVE

## 2022-12-26 MED ORDER — PROPRANOLOL HCL 10 MG PO TABS
10.0000 mg | ORAL_TABLET | Freq: Three times a day (TID) | ORAL | Status: DC
  Administered 2022-12-26 – 2022-12-29 (×7): 10 mg via ORAL
  Filled 2022-12-26 (×17): qty 1
  Filled 2022-12-26: qty 21
  Filled 2022-12-26: qty 1

## 2022-12-26 MED ORDER — CLONIDINE HCL 0.1 MG PO TABS
0.1000 mg | ORAL_TABLET | Freq: Every day | ORAL | Status: AC
  Administered 2022-12-26: 0.1 mg via ORAL
  Filled 2022-12-26: qty 1

## 2022-12-26 MED ORDER — GABAPENTIN 100 MG PO CAPS
200.0000 mg | ORAL_CAPSULE | Freq: Three times a day (TID) | ORAL | Status: DC
  Administered 2022-12-26 – 2022-12-30 (×12): 200 mg via ORAL
  Filled 2022-12-26 (×8): qty 2
  Filled 2022-12-26: qty 21
  Filled 2022-12-26 (×8): qty 2

## 2022-12-26 MED ORDER — ESCITALOPRAM OXALATE 5 MG PO TABS
5.0000 mg | ORAL_TABLET | Freq: Every day | ORAL | Status: AC
  Administered 2022-12-26: 5 mg via ORAL
  Filled 2022-12-26: qty 1

## 2022-12-26 MED ORDER — ESCITALOPRAM OXALATE 10 MG PO TABS
10.0000 mg | ORAL_TABLET | Freq: Every day | ORAL | Status: AC
  Administered 2022-12-27: 10 mg via ORAL
  Filled 2022-12-26: qty 1

## 2022-12-26 MED ORDER — NICOTINE POLACRILEX 2 MG MT GUM: 2.0000 mg | CHEWING_GUM | OROMUCOSAL | Status: DC | PRN

## 2022-12-26 MED ORDER — NICOTINE 7 MG/24HR TD PT24
7.0000 mg | MEDICATED_PATCH | Freq: Every day | TRANSDERMAL | Status: DC
  Administered 2022-12-26 – 2022-12-30 (×5): 7 mg via TRANSDERMAL
  Filled 2022-12-26 (×6): qty 1

## 2022-12-26 NOTE — BHH Suicide Risk Assessment (Signed)
[name] INPATIENT:  Family/Significant Other Suicide Prevention Education  Suicide Prevention Education:  Education Completed; [name] [phone] (Mother) has been identified by the patient as the family member/significant other with whom the patient will be residing, and identified as the person(s) who will aid the patient in the event of a mental health crisis (suicidal ideations/suicide attempt).  With written consent from the patient, the family member/significant other has been provided the following suicide prevention education, prior to the and/or following the discharge of the patient.  The suicide prevention education provided includes the following: Suicide risk factors Suicide prevention and interventions National Suicide Hotline telephone number [facility] assessment telephone number [facility] Emergency Assistance [place] and/or Residential Mobile Crisis Unit telephone number  Request made of family/significant other to: Remove weapons (e.g., guns, rifles, knives), all items previously/currently identified as safety concern.   Remove drugs/medications (over-the-counter, prescriptions, illicit drugs), all items previously/currently identified as a safety concern.  The family member/significant other verbalizes understanding of the suicide prevention education information provided.  The family member/significant other agrees to remove the items of safety concern listed above.  CSW spoke with Mrs. Kline who states that her daughter has been "struggling with alcohol for several years".  She states that she would like her daughter to go a treatment center if possible.  She confirms that her daughter does have her own home and can return there after discharge.  Mrs. Kline states that there are no known firearms or weapons in her daughter's home.  She states that her daughter lives across the street from her and that she will continue to be a support for her after  discharge.  CSW completed SPE with Mrs. Kline.   Frutoso Chase [name] 12/26/2022, 2:01 PM

## 2022-12-26 NOTE — [facility] Counselor (Signed)
Adult Comprehensive Assessment  Patient ID: [name], female   DOB: 1966-11-30, 57 y.o.   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 Bereavement / Loss: Pt reports no stressors  Living/Environment/Situation:  Living Arrangements: Alone Living conditions (as described by patient or guardian): [place]/House Who else lives in the home?: Alone How long has patient lived in current situation?: 1 year What is atmosphere in current home: Comfortable  Family History:  Marital status: Divorced Divorced, when?: July 2023 What types of issues is patient dealing with in the relationship?: "My drinking" Are you sexually active?: Yes What is your sexual orientation?: Heterosexual Has your sexual activity been affected by drugs, alcohol, medication, or emotional stress?: No Does patient have children?: Yes How many children?: 2 How is patient's relationship with their children?: "I have an 18yo and a 27yo and we get along good"  Childhood History:   By whom was/is the patient raised?: Adoptive parents Additional childhood history information: Adopted as infant; Pt reports that she does not know her biological parents Description of patient's relationship with caregiver when they were a child: "We got along great" Patient's description of current relationship with people who raised him/her: "We still get along good, I am close with my mother" How were you disciplined when you got in trouble as a child/adolescent?: Spankings and Groundings Does patient have siblings?: Yes Number of Siblings: 2 Description of patient's current relationship with siblings: "I have 2 brothers and I get along good with them" Did patient suffer any verbal/emotional/physical/sexual abuse as a child?: No Did patient suffer from severe childhood neglect?: No Has patient ever been sexually abused/assaulted/raped as an adolescent or adult?: No Was the patient ever a victim of a crime or a disaster?: No Witnessed domestic violence?: No Has patient been affected by domestic violence as an adult?: No  Education:  Highest grade of school patient has completed: 12th grade; Associate Degree in Nursing Currently a student?: No Learning disability?: Yes What learning problems does patient have?: "ADHD"  Employment/Work Situation:   Employment Situation: Employed 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 How Long has Patient Been Employed?: "Since 2014" Are You Satisfied With Your Job?: No Do You Work More Than One Job?: No Work Stressors: "One of my patients is sick and dying and I am around so many people during the day that I don't want to talk or be around people at the end of the day" Patient's Job has Been Impacted by Current Illness: No What is the Longest Time Patient has Held a Job?: 10 years Where was the Patient Employed at that Time?: [employer] Has Patient ever Been in the [employer]?:  No  Financial Resources:   Financial resources: Income  from employment, Private insurance [employer]) Does patient have a representative payee or guardian?: No  Alcohol/Substance Abuse:   What has been your use of drugs/alcohol within the last 12 months?: 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 If attempted suicide, did drugs/alcohol play a role in this?: No Alcohol/Substance Abuse Treatment Hx: Past Tx, Inpatient If yes, describe treatment: [facility] in [place] in 2023 Has alcohol/substance abuse ever caused legal problems?: No  Social Support System:   Describe Community Support System: "Mother and friend" Type of faith/religion: Passenger transport manager" How does patient's faith help to cope with current illness?: "I go to church to the [employer]"  Leisure/Recreation:   Do You Have Hobbies?: Yes Leisure and Hobbies: "Painting and Art"  Strengths/Needs:   What is the patient's perception of their strengths?: "Being strong" Patient states they can use these personal strengths during their treatment to contribute to their recovery: "I don't know" Patient states these barriers may affect/interfere with their treatment: None Patient states these barriers may affect their return to the community: None Other important information patient would like considered in planning for their treatment: None  Discharge Plan:   Currently receiving community mental health services: Yes (From Whom) ([facility] for medication management) Patient states concerns and preferences for aftercare planning are: Pt would like to remain with current psychiatrist for medication management but is interested in beginning outpatient therapy services Patient states they will know when they are safe and ready for discharge when: "I don't know" Does patient have access to transportation?: Yes (Own car at home) Does patient have financial barriers related to discharge medications?: No Will patient be  returning to same living situation after discharge?: Yes  Summary/Recommendations:   Summary and Recommendations (to be completed by the evaluator): Ninetta Adelstein is a 57 year old, female, who was admitted to the hospital due to worsening depression, anxiety, and suicidal thoughts.  The Pt reports experiencing depressive symptoms for approximately 1 month due to "financial difficulties".  The Pt reports living alone and working as a caregiver since 2014.  She reports that she experienced a Divorce in July 2023 "due to my drinking".  She states that she has 2 children ages 37 and 38 and that she is close with both of them.  She reports that she is also close with her adoptive parents and her brother.  The Pt reports that she was adopted as an infant and has never met her biological parents.  The Pt denies any childhood abuse or traum.  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 She reports being admitted to [facility] in [place] in 2023 and denies any current substance use treatment. While in the hospital the Pt can benefit from crisis stabilization, medication evaluation, group therapy, psycho-education, case management, and discharge planning. Upon discharge the Pt would like to return to her own home. It is recommended that the Pt follow-up with her current psychiatrist at [facility] for medication management and begin therapy services with another local outpatient provider.  The Pt states that she is open to attending [facility] or [facility] services for her substance use. It is also recommended that the Pt continue to take all medications as prescribed until directed to  do otherwise by his providers.  Darleen Crocker. 12/26/2022

## 2022-12-26 NOTE — Group Note (Signed)
LCSW Group Therapy Note   Group Date: 12/26/2022 Start Time: 1100 End Time: 1200  Type of Therapy and Topic: Group Therapy: Effective Communication   Participation Level: Did not attend    Description of Group:  In this group patients will be asked to identify their own styles of communication as well as defining and identifying passive, assertive, and aggressive styles of communication. Participants will identify strategies to communicate in a more assertive manner in an effort to appropriately meet their needs. This group will be process-oriented, with patients participating in exploration of their own experiences as well as giving and receiving support and challenge from other group members.   Therapeutic Goals: 1. Patient will identify their personal communication style. 2. Patient will identify passive, assertive, and aggressive forms of communication. 3. Patient will identify strategies for developing more effective communication to appropriately meet their needs.    Summary of Patient Progress: Did not attend    Therapeutic Modalities:  Communication Skills Solution Focused Therapy Motivational Interviewing  Darleen Crocker, [place] 12/26/2022  12:57 PM

## 2022-12-26 NOTE — Progress Notes (Signed)
   12/26/22 0800  Psych Admission Type (Psych Patients Only)  Admission Status Voluntary  Psychosocial Assessment  Patient Complaints Anxiety  Eye Contact Brief  Facial Expression Anxious  Affect Anxious  Speech Logical/coherent  Interaction Other (Comment) (cooperative)  Appearance/Hygiene Unremarkable  Behavior Characteristics Cooperative;Appropriate to situation  Mood Anxious;Sad  Thought Process  Coherency WDL  Content WDL  Delusions None reported or observed  Perception WDL  Hallucination None reported or observed  Judgment Poor  Confusion None  Danger to Self  Current suicidal ideation? Denies  Self-Injurious Behavior No self-injurious ideation or behavior indicators observed or expressed   Agreement Not to Harm Self Yes  Description of Agreement Verbal  Danger to Others  Danger to Others None reported or observed

## 2022-12-26 NOTE — Progress Notes (Signed)
Pt did not attend group discussion 

## 2022-12-26 NOTE — H&P (Cosign Needed Addendum)
Psychiatric Admission Assessment Adult  Patient Identification: [name] MRN:  [id number] Date of Evaluation:  12/26/2022 Chief Complaint:  MDD (major depressive disorder), recurrent episode, severe ([place]) [F33.2] Principal Diagnosis: MDD (major depressive disorder), recurrent severe, without psychosis ([place]) Diagnosis:  Principal Problem:   MDD (major depressive disorder), recurrent severe, without psychosis ([place]) Active Problems:   GAD (generalized anxiety disorder)   Insomnia   MDD (major depressive disorder), recurrent episode, severe ([place])   Alcohol use disorder   Asthma  CC: "I need help getting through this really bad depression". Reason For Admission: [name] is a 57 yo Caucasian female with prior mental health history of MDD, ADHD, GAD & alcohol use d/o who presented to the [employer] [facility] health urgent care [facility]) with complaints of worsening depressive symptoms with suicidal ideations with a plan to overdose on her medications in the context of financial stressors and alcohol abuse. Pt was transferred voluntarily to this [facility] hospital for treatment and stabilization of her mood.   Mode of transport to Hospital: Safe transport Current Outpatient (Home) Medication List:  PRN medication prior to evaluation: Atarax 25 mg prn, milk of magnesia, Maalox, Tylenol.   ED course:Uneventful Collateral Information:none  POA/Legal Guardian: Pt is her own guardian  HPI: Patient reports worsening depressive symptoms since November 2023, reports worsening insomnia, states she sleeps 1 hour per night.  Patient also reports worsening feelings of guilt regarding her drinking and the turn that her life has taken, reports worsening anhedonia, decreased concentration levels, decreased energy levels, psychomotor retardation, decreased appetite with a significant weight loss over the past couple of months, more frequent panic attacks as well as feelings of  hopelessness, helplessness and worthlessness.   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  Pt reports another stressor as her inability to maintain her sobriety; states that she has been to rehab for alcohol use 4 times in the past with the  last time being at "[facility]" at [facility] in [place], and came back to [facility] in July 2023, but began drinking alcohol right away due to going through a divorce with her ex-husband at the time.   Pt reports feelings of loneliness and isolation as being another stressor; She states that she has two children (an 57 yo daughter who lives with her father in [name] and a 28 yo son who lives in [name]), but both do not communicate with her and she lives alone.   Pt denies any history of mania, denies any history of head trauma/concussion, denies any history of seizures, denies any history of emotional, sexual or physical abuse in the past or presently. She denies any history of self injurious behaviors, denies any history of psychosis, denies any obsessions/compulsions in the past or currently.  Past Psychiatric Hx: Previous Psych Diagnoses: MDD, ADHD, alcohol use disorder, GAD, Prior inpatient treatment: 01/05/2021 at this [facility].  01/04/2016 at this hospital.  10/06/2015 at this hospital. Current/prior outpatient treatment: Dr.  Izzy with [facility] Prior rehab hx: Multiple as in HPI above Psychotherapy hx: Denies History of suicide attempts: Denies History of homicide or aggression: Denies  Psychiatric medication history:  Lexapro 10 years ago was helpful for depressive symptoms.  Latuda in the past was not helpful to years ago and made her feel "numb".  Prozac currently not helpful.  Clonidine helpful in the past with anxiety.  Gabapentin currently helpful with anxiety.  Lamictal currently helpful with mood stabilization and anger issues.  Psychiatric medication compliance history: Compliant as per patient Neuromodulation history: Denies Current Psychiatrist: Dr. Morene Antu [facility] Current therapist: Denies  Substance Abuse Hx: Alcohol: Reports that alcohol use started in her 30s, currently drinks 500 mL of wine x 3 bottles daily.  Reports several blackouts related to alcohol use, reports that she has needed alcohol first thing in the morning multiple times due to withdrawal symptoms such as tremors and anxiety related to alcohol withdrawals.  Denies any history of alcohol withdrawal seizures.  Tobacco: 1 pack of cigarettes per week, also dips nicotine. Illicit drugs: Occasional THC use.  Reports using delta 8 Gummies prior to hospitalization.  States that she typically does not use this substance.  Denies any other substance use. Rx drug abuse: Denies Rehab hx: Denies  Past Medical History: Medical Diagnoses: Denies.  Diverticulitis as per chart review.  Migraines as per chart review.  Asthma as per chart review. Home Rx: Denies Prior Hosp: Denies Prior Surgeries/Trauma: Left colon resection on 06/16/2020.  Robotic assisted for diverticulitis as per chart review. Head trauma, LOC, concussions, seizures: Denies Allergies: Codeine and hydrocodone causes vomiting. LMP: Postmenopausal Contraception: None. PCP: None.  Family History: Medical: Denies Psych: Paternal grandmother with anxiety Psych Rx: Denies SA/HA: Denies Substance use family hx: Maternal grandfather with alcoholism  Social History: Patient reports highest level of education as being an associates degree in nursing.  Reports that she is  divorced and has 2 children, as reported above.  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  She reports a history of having a DUI in 2020, but states that it is now resolved.  She denies having a support system currently, denies ever serving in the [employer]  Denies access to firearms.  Current Presentation: Pt with flat affect and depressed mood, attention to personal hygiene and grooming is fair, eye contact is good, speech is clear & coherent. Thought contents are organized and logical, and pt currently endorses passive SI, denies having a plan or intent, and verbally contracts for safety on the unit. She denies HI/AVH or paranoia. There is no evidence of delusional thoughts.    Associated Signs/Symptoms: Depression Symptoms:  depressed mood, anhedonia, insomnia, fatigue, feelings of worthlessness/guilt, difficulty concentrating, hopelessness, recurrent thoughts of death, anxiety, panic attacks, loss of energy/fatigue, disturbed sleep, (Hypo) Manic Symptoms:   na Anxiety Symptoms:  Excessive Worry, Panic Symptoms, Psychotic Symptoms:   n/a PTSD Symptoms: NA Total Time spent with patient: 1.5 hours  Is the patient at risk to self? Yes.    Has the patient been a risk to self in the past 6 months? Yes.    Has the patient been a risk to self within the distant past? No.  Is the patient a risk to others? No.  Has the patient been a risk to others in the past 6 months? No.  Has the patient been a risk to others within the distant past? No.   [place] Scale:  Flowsheet  Row Admission (Current) from 12/25/2022 in [facility] Most recent reading at 12/25/2022 11:33 PM ED from 12/25/2022 in [facility] Most recent reading at 12/25/2022  4:06 PM ED from 12/22/2021 in [facility] Emergency Department at [facility] Most recent reading at 12/22/2021 10:20 AM  C-SSRS RISK CATEGORY Low Risk  Moderate Risk No Risk       Alcohol Screening: 1. How often do you have a drink containing alcohol?: 4 or more times a week 2. How many drinks containing alcohol do you have on a typical day when you are drinking?: 10 or more 3. How often do you have six or more drinks on one occasion?: Daily or almost daily AUDIT-C Score: 12 4. How often during the last year have you found that you were not able to stop drinking once you had started?: Daily or almost daily (BINGING X3 WEEKS) 5. How often during the last year have you failed to do what was normally expected from you because of drinking?: Daily or almost daily (BINGING X3 WEEKS) 6. How often during the last year have you needed a first drink in the morning to get yourself going after a heavy drinking session?: Daily or almost daily (BINGING X3 WEEKS) 7. How often during the last year have you had a feeling of guilt of remorse after drinking?: Daily or almost daily 8. How often during the last year have you been unable to remember what happened the night before because you had been drinking?: Less than monthly 9. Have you or someone else been injured as a result of your drinking?: No 10. Has a relative or friend or a doctor or another health worker been concerned about your drinking or suggested you cut down?: No Alcohol Use Disorder Identification Test Final Score (AUDIT): 29 Substance Abuse History in the last 12 months:  Yes.   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   History of anemia    Severe episode of recurrent major depressive disorder, without psychotic features ([place]) 01/07/2020   Sleep apnea    mild   SUI (stress urinary incontinence, female)     Past Surgical History:  Procedure Laterality Date   BLADDER SUSPENSION N/A 02/13/2013   Procedure: [facility] SLING;  Surgeon: Malka So, MD;  Location: [facility];  Service: Urology;  Laterality: N/A;   CESAREAN SECTION  12-23-2004    W/ LEFT TUBAL LIGATION AND RIGHT SALPINGECTOMY   CESAREAN SECTION  1995   CYSTOSCOPY WITH STENT PLACEMENT Bilateral 06/16/2020   Procedure: CYSTOSCOPY WITH FIREFLY INJECTION;  Surgeon: Lucas Mallow, MD;  Location: [facility];  Service: Urology;  Laterality: Bilateral;   IR RADIOLOGIST EVAL & MGMT  04/21/2020   IR RADIOLOGIST EVAL & MGMT  05/12/2020   ORIF HUMERUS FRACTURE Right 04/28/2014   Procedure: OPEN REDUCTION INTERNAL FIXATION (ORIF) PROXIMAL HUMERUS FRACTURE;  Surgeon: Nita Sells, MD;  Location: [facility];  Service: Orthopedics;  Laterality: Right;   ORIF PROXIMAL HUMERUS FRACTURE Right 04/28/2014   DR CHANDLER   PROCTOSCOPY N/A 06/16/2020   Procedure: RIGID PROCTOSCOPY;  Surgeon: Michael Boston, MD;  Location: [facility];  Service: General;  Laterality: N/A;   TUBAL LIGATION     Family History:  Family History  Adopted: Yes   Family Psychiatric  History: See above Tobacco Screening:  Social History   Tobacco Use  Smoking Status Former   Packs/day: 0.25   Years: 20.00   Total pack years: 5.00   Types: Cigarettes   Quit date: 09/24/2019   Years since quitting: 3.2  Smokeless Tobacco Never    BH Tobacco Counseling     Are you interested in Tobacco Cessation Medications?  No value filed. Counseled patient on smoking cessation:  No value filed. Reason Tobacco Screening Not Completed: No value filed.       Social History:  Social History   Substance and Sexual Activity   Alcohol Use Yes   Alcohol/week: 2.0 standard drinks of alcohol   Types: 2 Glasses of wine per week   Comment: 12/29/20 restarted      Social History   Substance and Sexual Activity  Drug Use No    Allergies  Allergen Reactions   Codeine Nausea And Vomiting and Other (See Comments)    dizziness   Hydrocodone Other (See Comments)    Dizzy; Tolerates oxycodone fine   Lab Results:  Results for orders placed or performed during the hospital encounter of 12/25/22 (from the past 48 hour(s))  CBC with Differential/Platelet     Status: Abnormal   Collection Time: 12/25/22  4:36 PM  Result Value Ref Range   WBC 7.9 4.0 - 10.5 K/uL   RBC 5.10 3.87 - 5.11 MIL/uL   Hemoglobin 16.0 (H) 12.0 - 15.0 g/dL   HCT 46.9 (H) 36.0 - 46.0 %   MCV 92.0 80.0 - 100.0 fL   MCH 31.4 26.0 - 34.0 pg   MCHC 34.1 30.0 - 36.0 g/dL   RDW 12.9 11.5 - 15.5 %   Platelets 254 150 - 400 K/uL   nRBC 0.0 0.0 - 0.2 %   Neutrophils Relative % 67 %   Neutro Abs 5.4 1.7 - 7.7 K/uL   Lymphocytes Relative 23 %   Lymphs Abs 1.8 0.7 - 4.0 K/uL   Monocytes Relative 8 %   Monocytes Absolute 0.6 0.1 - 1.0 K/uL   Eosinophils Relative 1 %   Eosinophils Absolute 0.0 0.0 - 0.5 K/uL   Basophils Relative 1 %   Basophils Absolute 0.0 0.0 - 0.1 K/uL   Immature Granulocytes 0 %   Abs Immature Granulocytes 0.02 0.00 - 0.07 K/uL    Comment: Performed at [facility], [address] [place], [place] [address]  Comprehensive metabolic panel     Status: None   Collection Time: 12/25/22  4:36 PM  Result Value Ref Range   Sodium 140 135 - 145 mmol/L   Potassium 3.5 3.5 - 5.1 mmol/L   Chloride 104 98 - 111 mmol/L   CO2 22 22 - 32 mmol/L   Glucose, Bld 77 70 - 99 mg/dL    Comment: Glucose reference range applies only to samples taken after fasting for at least 8 hours.   BUN 7 6 - 20 mg/dL   Creatinine, Ser 0.60 0.44 - 1.00 mg/dL   Calcium 9.0 8.9 - 10.3 mg/dL   Total Protein 7.5 6.5 -  8.1 g/dL   Albumin 4.4 3.5 - 5.0  g/dL   AST 28 15 - 41 U/L   ALT 18 0 - 44 U/L   Alkaline Phosphatase 89 38 - 126 U/L   Total Bilirubin 0.8 0.3 - 1.2 mg/dL   GFR, Estimated >60 >60 mL/min    Comment: (NOTE) Calculated using the CKD-EPI Creatinine Equation (2021)    Anion gap 14 5 - 15    Comment: Performed at [facility] [address], [place], [place] [address]  Magnesium     Status: None   Collection Time: 12/25/22  4:36 PM  Result Value Ref Range   Magnesium 2.0 1.7 - 2.4 mg/dL    Comment: Performed at [facility] [address] [place], [place] [address]  RPR     Status: None   Collection Time: 12/25/22  4:36 PM  Result Value Ref Range   RPR Ser Ql NON REACTIVE NON REACTIVE    Comment: Performed at [facility] [address], [place], [place] [address]  POCT Urine Drug Screen - (I-Screen)     Status: Abnormal   Collection Time: 12/25/22  4:50 PM  Result Value Ref Range   POC Amphetamine UR None Detected NONE DETECTED (Cut Off Level 1000 ng/mL)   POC Secobarbital (BAR) None Detected NONE DETECTED (Cut Off Level 300 ng/mL)   POC Buprenorphine (BUP) None Detected NONE DETECTED (Cut Off Level 10 ng/mL)   POC Oxazepam (BZO) None Detected NONE DETECTED (Cut Off Level 300 ng/mL)   POC Cocaine UR None Detected NONE DETECTED (Cut Off Level 300 ng/mL)   POC Methamphetamine UR None Detected NONE DETECTED (Cut Off Level 1000 ng/mL)   POC Morphine None Detected NONE DETECTED (Cut Off Level 300 ng/mL)   POC Methadone UR None Detected NONE DETECTED (Cut Off Level 300 ng/mL)   POC Oxycodone UR None Detected NONE DETECTED (Cut Off Level 100 ng/mL)   POC Marijuana UR Positive (A) NONE DETECTED (Cut Off Level 50 ng/mL)  POC urine preg, ED     Status: Normal   Collection Time: 12/25/22  4:50 PM  Result Value Ref Range   Preg Test, Ur Negative Negative  Resp panel by RT-PCR (RSV, Flu A&B, Covid) Anterior Nasal Swab     Status: None   Collection Time: 12/25/22  4:53 PM   Specimen: Anterior Nasal Swab   Result Value Ref Range   SARS Coronavirus 2 by RT PCR NEGATIVE NEGATIVE    Comment: (NOTE) SARS-CoV-2 target nucleic acids are NOT DETECTED.  The SARS-CoV-2 RNA is generally detectable in upper respiratory specimens during the acute phase of infection. The lowest concentration of SARS-CoV-2 viral copies this assay can detect is 138 copies/mL. A negative result does not preclude SARS-Cov-2 infection and should not be used as the sole basis for treatment or other patient management decisions. A negative result may occur with  improper specimen collection/handling, submission of specimen other than nasopharyngeal swab, presence of viral mutation(s) within the areas targeted by this assay, and inadequate number of viral copies(<138 copies/mL). A negative result must be combined with clinical observations, patient history, and epidemiological information. The expected result is Negative.  Fact Sheet for Patients:  EntrepreneurPulse.com.au  Fact Sheet for Healthcare Providers:  IncredibleEmployment.be  This test is no t yet approved or cleared by the [place] FDA and  has been authorized for detection and/or diagnosis of SARS-CoV-2 by FDA under an Emergency Use Authorization (EUA). This EUA will  remain  in effect (meaning this test can be used) for the duration of the COVID-19 declaration under Section 564(b)(1) of the Act, 21 U.S.C.section 360bbb-3(b)(1), unless the authorization is terminated  or revoked sooner.       Influenza A by PCR NEGATIVE NEGATIVE   Influenza B by PCR NEGATIVE NEGATIVE    Comment: (NOTE) The Xpert Xpress SARS-CoV-2/FLU/RSV plus assay is intended as an aid in the diagnosis of influenza from Nasopharyngeal swab specimens and should not be used as a sole basis for treatment. Nasal washings and aspirates are unacceptable for Xpert Xpress SARS-CoV-2/FLU/RSV testing.  Fact Sheet for  Patients: EntrepreneurPulse.com.au  Fact Sheet for Healthcare Providers: IncredibleEmployment.be  This test is not yet approved or cleared by the [place] FDA and has been authorized for detection and/or diagnosis of SARS-CoV-2 by FDA under an Emergency Use Authorization (EUA). This EUA will remain in effect (meaning this test can be used) for the duration of the COVID-19 declaration under Section 564(b)(1) of the Act, 21 U.S.C. section 360bbb-3(b)(1), unless the authorization is terminated or revoked.     Resp Syncytial Virus by PCR NEGATIVE NEGATIVE    Comment: (NOTE) Fact Sheet for Patients: EntrepreneurPulse.com.au  Fact Sheet for Healthcare Providers: IncredibleEmployment.be  This test is not yet approved or cleared by the [place] FDA and has been authorized for detection and/or diagnosis of SARS-CoV-2 by FDA under an Emergency Use Authorization (EUA). This EUA will remain in effect (meaning this test can be used) for the duration of the COVID-19 declaration under Section 564(b)(1) of the Act, 21 U.S.C. section 360bbb-3(b)(1), unless the authorization is terminated or revoked.  Performed at [facility], [place] [address], [place], [place] [address]   Hemoglobin A1c     Status: None   Collection Time: 12/25/22  4:53 PM  Result Value Ref Range   Hgb A1c MFr Bld 5.2 4.8 - 5.6 %    Comment: (NOTE) Pre diabetes:          5.7%-6.4%  Diabetes:              >6.4%  Glycemic control for   <7.0% adults with diabetes    Mean Plasma Glucose 102.54 mg/dL    Comment: Performed at [facility] [address] [place], [place] [address]  Ethanol     Status: Abnormal   Collection Time: 12/25/22  4:53 PM  Result Value Ref Range   Alcohol, Ethyl (B) 188 (H) <10 mg/dL    Comment: (NOTE) Lowest detectable limit for serum alcohol is 10 mg/dL.  For medical purposes only. Performed at [facility], [place] [address], [place],  [address]   Lipid panel     Status: Abnormal   Collection Time: 12/25/22  4:53 PM  Result Value Ref Range   Cholesterol 223 (H) 0 - 200 mg/dL   Triglycerides 57 <150 mg/dL   HDL 128 >40 mg/dL   Total CHOL/HDL Ratio 1.7 RATIO   VLDL 11 0 - 40 mg/dL   LDL Cholesterol 84 0 - 99 mg/dL    Comment:        Total Cholesterol/HDL:CHD Risk Coronary Heart Disease Risk Table                     Men   Women  1/2 Average Risk   3.4   3.3  Average Risk       5.0   4.4  2 X Average Risk  9.6   7.1  3 X Average Risk  23.4   11.0        Use the calculated Patient Ratio above and the CHD Risk Table to determine the patient's CHD Risk.        ATP III CLASSIFICATION (LDL):  <100     mg/dL   Optimal  100-129  mg/dL   Near or Above                    Optimal  130-159  mg/dL   Borderline  160-189  mg/dL   High  >190     mg/dL   Very High Performed at [facility] [address], [place], [place] [address]   TSH     Status: None   Collection Time: 12/25/22  4:53 PM  Result Value Ref Range   TSH 1.061 0.350 - 4.500 uIU/mL    Comment: Performed by a 3rd Generation assay with a functional sensitivity of <=0.01 uIU/mL. Performed at [facility], [place] [address], [place], [place] [address]   Urinalysis, Complete w Microscopic PATH Cytology Urine     Status: Abnormal   Collection Time: 12/25/22  4:53 PM  Result Value Ref Range   Color, Urine YELLOW YELLOW   APPearance HAZY (A) CLEAR   Specific Gravity, Urine 1.016 1.005 - 1.030   pH 5.0 5.0 - 8.0   Glucose, UA NEGATIVE NEGATIVE mg/dL   Hgb urine dipstick SMALL (A) NEGATIVE   Bilirubin Urine NEGATIVE NEGATIVE   Ketones, ur NEGATIVE NEGATIVE mg/dL   Protein, ur 100 (A) NEGATIVE mg/dL   Nitrite NEGATIVE NEGATIVE   Leukocytes,Ua TRACE (A) NEGATIVE   RBC / HPF 0-5 0 - 5 RBC/hpf   WBC, UA 0-5 0 - 5 WBC/hpf   Bacteria, UA FEW (A) NONE SEEN   Squamous Epithelial / HPF 6-10 0 - 5 /HPF    Mucus PRESENT     Comment: Performed at [facility], [place] [address] [place], [place] [address]  POC SARS Coronavirus 2 Ag     Status: None   Collection Time: 12/25/22  4:55 PM  Result Value Ref Range   SARSCOV2ONAVIRUS 2 AG NEGATIVE NEGATIVE    Comment: (NOTE) SARS-CoV-2 antigen NOT DETECTED.   Negative results are presumptive.  Negative results do not preclude SARS-CoV-2 infection and should not be used as the sole basis for treatment or other patient management decisions, including infection  control decisions, particularly in the presence of clinical signs and  symptoms consistent with COVID-19, or in those who have been in contact with the virus.  Negative results must be combined with clinical observations, patient history, and epidemiological information. The expected result is Negative.  Fact Sheet for Patients: HandmadeRecipes.com.cy  Fact Sheet for Healthcare Providers: FuneralLife.at  This test is not yet approved or cleared by the [place] FDA and  has been authorized for detection and/or diagnosis of SARS-CoV-2 by FDA under an Emergency Use Authorization (EUA).  This EUA will remain in effect (meaning this test can be used) for the duration of  the COV ID-19 declaration under Section 564(b)(1) of the Act, 21 U.S.C. section 360bbb-3(b)(1), unless the authorization is terminated or revoked sooner.      Blood Alcohol level:  Lab Results  Component Value Date   ETH 188 (H) 12/25/2022   ETH 168 (H) 36/62/9476    Metabolic Disorder Labs:  Lab Results  Component Value Date   HGBA1C 5.2 12/25/2022  MPG 102.54 12/25/2022   MPG 114.02 01/06/2021   No results found for: "PROLACTIN" Lab Results  Component Value Date   CHOL 223 (H) 12/25/2022   TRIG 57 12/25/2022   HDL 128 12/25/2022   CHOLHDL 1.7 12/25/2022   VLDL 11 12/25/2022   LDLCALC 84 12/25/2022   LDLCALC 82 01/06/2021   Current  Medications: Current Facility-Administered Medications  Medication Dose Route Frequency Provider Last Rate Last Admin   acetaminophen (TYLENOL) tablet 650 mg  650 mg Oral Q6H PRN Revonda Humphrey, NP   650 mg at 12/26/22 0817   albuterol (VENTOLIN HFA) 108 (90 Base) MCG/ACT inhaler 2 puff  2 puff Inhalation PRN Revonda Humphrey, NP       alum & mag hydroxide-simeth (MAALOX/MYLANTA) 200-200-20 MG/5ML suspension 30 mL  30 mL Oral Q4H PRN Revonda Humphrey, NP       Derrill Memo ON 12/27/2022] escitalopram (LEXAPRO) tablet 10 mg  10 mg Oral Daily Massengill, Nathan, MD       gabapentin (NEURONTIN) capsule 200 mg  200 mg Oral Q8H Massengill, Nathan, MD   200 mg at 12/26/22 1524   hydrOXYzine (ATARAX) tablet 25 mg  25 mg Oral Q6H PRN Revonda Humphrey, NP       lamoTRIgine (LAMICTAL) tablet 25 mg  25 mg Oral Daily Thomes Lolling H, NP   25 mg at 12/26/22 0816   loperamide (IMODIUM) capsule 2-4 mg  2-4 mg Oral PRN Revonda Humphrey, NP       LORazepam (ATIVAN) tablet 1 mg  1 mg Oral Q6H PRN Revonda Humphrey, NP       LORazepam (ATIVAN) tablet 1 mg  1 mg Oral QID Revonda Humphrey, NP   1 mg at 12/26/22 1145   Followed by   Derrill Memo ON 12/27/2022] LORazepam (ATIVAN) tablet 1 mg  1 mg Oral TID Revonda Humphrey, NP       Followed by   Derrill Memo ON 12/28/2022] LORazepam (ATIVAN) tablet 1 mg  1 mg Oral BID Revonda Humphrey, NP       Followed by   Derrill Memo ON 12/29/2022] LORazepam (ATIVAN) tablet 1 mg  1 mg Oral Daily Revonda Humphrey, NP       magnesium hydroxide (MILK OF MAGNESIA) suspension 30 mL  30 mL Oral Daily PRN Revonda Humphrey, NP       multivitamin with minerals tablet 1 tablet  1 tablet Oral Daily Revonda Humphrey, NP   1 tablet at 12/26/22 0815   nicotine (NICODERM CQ - dosed in mg/24 hr) patch 7 mg  7 mg Transdermal Daily Massengill, Nathan, MD   7 mg at 12/26/22 1525   nicotine polacrilex (NICORETTE) gum 2 mg  2 mg Oral PRN Massengill, Ovid Curd, MD       ondansetron (ZOFRAN-ODT)  disintegrating tablet 4 mg  4 mg Oral Q6H PRN Revonda Humphrey, NP       progesterone (PROMETRIUM) capsule 200 mg  200 mg Oral Daily Thomes Lolling H, NP   200 mg at 12/26/22 0815   propranolol (INDERAL) tablet 10 mg  10 mg Oral Q8H Massengill, Nathan, MD   10 mg at 12/26/22 1525   thiamine (Vitamin B-1) tablet 100 mg  100 mg Oral Daily Revonda Humphrey, NP   100 mg at 12/26/22 0815   traZODone (DESYREL) tablet 50 mg  50 mg Oral QHS PRN Revonda Humphrey, NP   50 mg at 12/25/22 2249   PTA Medications: Medications  Prior to Admission  Medication Sig Dispense Refill Last Dose   acamprosate (CAMPRAL) 333 MG tablet Take 666 mg by mouth 3 (three) times daily with meals.      busPIRone (BUSPAR) 7.5 MG tablet Take 7.5 mg by mouth 2 (two) times daily.      gabapentin (NEURONTIN) 600 MG tablet Take 600 mg by mouth 2 (two) times daily.      hydrOXYzine (VISTARIL) 50 MG capsule Take 50 mg by mouth in the morning and at bedtime.      lamoTRIgine (LAMICTAL) 100 MG tablet Take 50 mg by mouth 2 (two) times daily.      progesterone (PROMETRIUM) 200 MG capsule Take 1 capsule (200 mg total) by mouth daily. 30 capsule 0    SUMAtriptan (IMITREX) 100 MG tablet Take 100 mg by mouth every 2 (two) hours as needed for migraine. May repeat in 2 hours if headache persists or recurs. Max 2 tablets in 24 hr      traZODone (DESYREL) 50 MG tablet Take 2 tablets (100 mg total) by mouth at bedtime as needed for sleep. (Patient taking differently: Take 50 mg by mouth at bedtime as needed for sleep.)      PROAIR HFA 108 (90 Base) MCG/ACT inhaler Inhale 2 puffs into the lungs as needed for wheezing or shortness of breath.      Musculoskeletal: Strength & Muscle Tone: within normal limits Gait & Station: normal Patient leans: N/A  Psychiatric Specialty Exam:  Presentation  General Appearance:  Fairly Groomed  Eye Contact: Fair  Speech: Clear and Coherent  Speech Volume: Normal  Handedness: Right   Mood  and Affect  Mood: Depressed; Anxious  Affect: Congruent   Thought Process  Thought Processes: Coherent  Duration of Psychotic Symptoms:N/A Past Diagnosis of Schizophrenia or Psychoactive disorder: No  Descriptions of Associations:Intact  Orientation:Full (Time, Place and Person)  Thought Content:Logical  Hallucinations:Hallucinations: None  Ideas of Reference:None  Suicidal Thoughts:Suicidal Thoughts: Yes, Passive SI Passive Intent and/or Plan: Without Intent; Without Plan  Homicidal Thoughts:Homicidal Thoughts: No   Sensorium  Memory: Immediate Good  Judgment: Fair  Insight: Fair   Materials engineer: Fair  Attention Span: Fair  Recall: [employer] of Knowledge: Fair  Language: Fair   Psychomotor Activity  Psychomotor Activity: Psychomotor Activity: Normal   Assets  Assets: Communication Skills   Sleep  Sleep: Sleep: Poor    Physical Exam: Physical Exam Constitutional:      Appearance: Normal appearance.  HENT:     Nose: Nose normal.  Eyes:     Pupils: Pupils are equal, round, and reactive to light.  Musculoskeletal:        General: Normal range of motion.     Cervical back: Normal range of motion.  Neurological:     General: No focal deficit present.     Mental Status: She is oriented to person, place, and time.    Review of Systems  Constitutional: Negative.   HENT: Negative.    Eyes: Negative.   Respiratory: Negative.    Cardiovascular: Negative.   Gastrointestinal: Negative.   Genitourinary: Negative.   Musculoskeletal: Negative.   Skin: Negative.   Neurological: Negative.   Psychiatric/Behavioral:  Positive for depression, substance abuse and suicidal ideas. Negative for hallucinations and memory loss. The patient is nervous/anxious and has insomnia.    Blood pressure 123/72, pulse 82, temperature 98.4 F (36.9 C), temperature source Oral, resp. rate 16, height '5\' 5"'$  (1.651 m), weight 60.8  kg, SpO2  98 %. Body mass index is 22.3 kg/m.  Treatment Plan Summary: Daily contact with patient to assess and evaluate symptoms and progress in treatment and Medication management  Observation Level/Precautions:  15 minute checks  Laboratory:  Labs reviewed   Psychotherapy:  Unit Group sessions  Medications:  See [facility]  Consultations:  To be determined   Discharge Concerns:  Safety, medication compliance, mood stability  Estimated LOS: 5-7 days  Other:  N/A   Labs reviewed on 12/26/2022: UA with trace leukocytes, few bacteria, hazy in appearance, will repeat.  TSH WNL, lipid panel with cholesterol 223 otherwise WNL, BAL on admission 188, hemoglobin A1c WNL, respiratory panel WNL, CMP WNL, CBC WNL.  EKG with QTc of 435.  PLAN Safety and Monitoring: Voluntary admission to inpatient psychiatric unit for safety, stabilization and treatment Daily contact with patient to assess and evaluate symptoms and progress in treatment Patient's case to be discussed in multi-disciplinary team meeting Observation Level : q15 minute checks Vital signs: q12 hours Precautions: Safety  Long Term Goal(s): Improvement in symptoms so as ready for discharge  Short Term Goals: Ability to identify changes in lifestyle to reduce recurrence of condition will improve, Ability to disclose and discuss suicidal ideas, Ability to demonstrate self-control will improve, Ability to identify and develop effective coping behaviors will improve, Compliance with prescribed medications will improve, and Ability to identify triggers associated with substance abuse/mental health issues will improve  Diagnoses:  Principal Problem:   MDD (major depressive disorder), recurrent severe, without psychosis ([place]) Active Problems:   GAD (generalized anxiety disorder)   Insomnia   MDD (major depressive disorder), recurrent episode, severe ([facility])   Alcohol use disorder   Asthma  Medications -Discontinue Prozac due to lack of  effectiveness -Start Lexapro 5 mg today and increased to 10 mg on 12/27/2022 (patient states medication was effective in the past for management of depressive symptoms) -Continue Ativan detox protocol as per MAR-please see MAR for complete order -Continue Lamictal 25 mg daily for mood stabilization (home medication) -Continue gabapentin: Started at 200 mg every 8 hours for GAD (home medication) -Continue albuterol inhaler as needed for wheezing and shortness of breath -Start Inderal 10 mg every 8 hours for tachycardia and anxiety -Continue trazodone 50 mg nightly as needed for anxiety -Continue progesterone 200 mg daily (home medication)  Other PRNS -Continue Tylenol 650 mg every 6 hours PRN for mild pain -Continue Maalox 30 mg every 4 hrs PRN for indigestion -Continue Imodium 2-4 mg as needed for diarrhea -Continue Milk of Magnesia as needed every 6 hrs for constipation -Continue Zofran disintegrating tabs every 6 hrs PRN for nausea   Discharge Planning: Social work and case management to assist with discharge planning and identification of hospital follow-up needs prior to discharge Estimated LOS: 5-7 days Discharge Concerns: Need to establish a safety plan; Medication compliance and effectiveness Discharge Goals: Return home with outpatient referrals for mental health follow-up including medication management/psychotherapy  I certify that inpatient services furnished can reasonably be expected to improve the patient's condition.    Nicholes Rough, NP 1/23/20243:38 PM

## 2022-12-26 NOTE — [facility] Suicide Risk Assessment (Signed)
Suicide Risk Assessment  Admission Assessment    [facility] Admission Suicide Risk Assessment   Nursing information obtained from:  Patient Demographic factors:  Caucasian, Low socioeconomic status, Living alone Current Mental Status:  NA Loss Factors:  Financial problems / change in socioeconomic status Historical Factors:  NA Risk Reduction Factors:  Positive social support, Sense of responsibility to family  Total Time spent with patient: 30 minutes Principal Problem: MDD (major depressive disorder), recurrent severe, without psychosis ([place]) Diagnosis:  Principal Problem:   MDD (major depressive disorder), recurrent severe, without psychosis ([place]) Active Problems:   GAD (generalized anxiety disorder)   Insomnia   MDD (major depressive disorder), recurrent episode, severe ([place])   Alcohol use disorder   Asthma  Subjective Data: : "I need help getting through this really bad depression".   Continued Clinical Symptoms: Passive SI, depressed mood, alcoholism with de Holl withdrawal symptoms,, financial stressors, in need of continuous hospitalization for treatment and stabilization of current symptoms.  Alcohol Use Disorder Identification Test Final Score (AUDIT): 29 The "Alcohol Use Disorders Identification Test", Guidelines for Use in Primary Care, Second Edition.  World Pharmacologist [facility]). Score between 0-7:  no or low risk or alcohol related problems. Score between 8-15:  moderate risk of alcohol related problems. Score between 16-19:  high risk of alcohol related problems. Score 20 or above:  warrants further diagnostic evaluation for alcohol dependence and treatment.  CLINICAL FACTORS:   Depression:   Anhedonia Comorbid alcohol abuse/dependence Hopelessness Insomnia Recent sense of peace/wellbeing Severe Alcohol/Substance Abuse/Dependencies More than one psychiatric diagnosis Unstable or Poor Therapeutic Relationship Previous Psychiatric Diagnoses and  Treatments  Musculoskeletal: Strength & Muscle Tone: within normal limits Gait & Station: normal Patient leans: N/A  Psychiatric Specialty Exam:  Presentation  General Appearance:  Fairly Groomed  Eye Contact: Fair  Speech: Clear and Coherent  Speech Volume: Normal  Handedness: Right   Mood and Affect  Mood: Depressed; Anxious  Affect: Congruent   Thought Process  Thought Processes: Coherent  Descriptions of Associations:Intact  Orientation:Full (Time, Place and Person)  Thought Content:Logical  History of Schizophrenia/Schizoaffective disorder:No  Duration of Psychotic Symptoms:No data recorded Hallucinations:Hallucinations: None  Ideas of Reference:None  Suicidal Thoughts:Suicidal Thoughts: Yes, Passive SI Passive Intent and/or Plan: Without Intent; Without Plan  Homicidal Thoughts:Homicidal Thoughts: No   Sensorium  Memory: Immediate Good  Judgment: Fair  Insight: Fair   Materials engineer: Fair  Attention Span: Fair  Recall: [employer] of Knowledge: Fair  Language: Fair   Psychomotor Activity  Psychomotor Activity: Psychomotor Activity: Normal   Assets  Assets: Communication Skills   Sleep  Sleep: Sleep: Poor   Physical Exam: Physical Exam Review of Systems  Psychiatric/Behavioral:  Positive for depression, substance abuse and suicidal ideas. Negative for hallucinations and memory loss. The patient is nervous/anxious and has insomnia.    Blood pressure 123/72, pulse 82, temperature 98.4 F (36.9 C), temperature source Oral, resp. rate 16, height '5\' 5"'$  (1.651 m), weight 60.8 kg, SpO2 98 %. Body mass index is 22.3 kg/m.   COGNITIVE FEATURES THAT CONTRIBUTE TO RISK:  None    SUICIDE RISK:    Moderate:  Frequent suicidal ideation with limited intensity, and duration, some specificity in terms of plans, no associated intent, good self-control, limited dysphoria/symptomatology, some risk  factors present, and identifiable protective factors, including available and accessible social support.   PLAN OF CARE: Please See H & P  I certify that inpatient services furnished can reasonably be expected to  improve the patient's condition.   Nicholes Rough, NP 12/26/2022, 3:41 PM

## 2022-12-26 NOTE — Group Note (Signed)
Recreation Therapy Group Note   Group Topic:Animal Assisted Therapy   Group Date: 12/26/2022 Start Time: 1430 End Time: 1508 Facilitators: Laylanie Kruczek-McCall, LRT,CTRS Location: [address] Dayroom   Animal-Assisted Activity (AAA) Program Checklist/Progress Notes Patient Eligibility Criteria Checklist & Daily Group note for Rec Tx Intervention  AAA/T Program Assumption of Risk Form signed by Patient/ or Parent Legal Guardian Yes  Patient understands his/her participation is voluntary Yes   Affect/Mood: N/A   Participation Level: Did not attend    Clinical Observations/Individualized Feedback:     Plan: Continue to engage patient in RT group sessions 2-3x/week.   Konner Saiz-McCall, LRT,CTRS 12/26/2022 4:09 PM

## 2022-12-27 ENCOUNTER — Encounter (HOSPITAL_COMMUNITY): Payer: Self-pay

## 2022-12-27 DIAGNOSIS — F332 Major depressive disorder, recurrent severe without psychotic features: Secondary | ICD-10-CM | POA: Diagnosis not present

## 2022-12-27 MED ORDER — SUMATRIPTAN SUCCINATE 25 MG PO TABS
25.0000 mg | ORAL_TABLET | ORAL | Status: DC | PRN
  Administered 2022-12-29: 25 mg via ORAL
  Filled 2022-12-27: qty 1

## 2022-12-27 MED ORDER — MOMETASONE FURO-FORMOTEROL FUM 200-5 MCG/ACT IN AERO
2.0000 | INHALATION_SPRAY | Freq: Two times a day (BID) | RESPIRATORY_TRACT | Status: DC
  Administered 2022-12-27 – 2022-12-30 (×7): 2 via RESPIRATORY_TRACT
  Filled 2022-12-27: qty 8.8

## 2022-12-27 MED ORDER — ESCITALOPRAM OXALATE 10 MG PO TABS
10.0000 mg | ORAL_TABLET | Freq: Every day | ORAL | Status: DC
  Administered 2022-12-28 – 2022-12-30 (×3): 10 mg via ORAL
  Filled 2022-12-27 (×4): qty 1
  Filled 2022-12-27: qty 7

## 2022-12-27 MED ORDER — ACAMPROSATE CALCIUM 333 MG PO TBEC
666.0000 mg | DELAYED_RELEASE_TABLET | Freq: Three times a day (TID) | ORAL | Status: DC
  Administered 2022-12-27 – 2022-12-30 (×7): 666 mg via ORAL
  Filled 2022-12-27 (×5): qty 2
  Filled 2022-12-27: qty 21
  Filled 2022-12-27 (×6): qty 2

## 2022-12-27 NOTE — Plan of Care (Signed)
  Problem: Safety: Goal: Ability to remain free from injury will improve Outcome: Progressing

## 2022-12-27 NOTE — Progress Notes (Signed)
   12/27/22 0200  Psych Admission Type (Psych Patients Only)  Admission Status Voluntary  Psychosocial Assessment  Patient Complaints Anxiety  Eye Contact Brief  Facial Expression Anxious  Affect Anxious  Speech Logical/coherent  Interaction Other (Comment)  Appearance/Hygiene Unremarkable  Behavior Characteristics Cooperative;Appropriate to situation  Mood Anxious;Sad  Thought Process  Coherency WDL  Content WDL  Delusions None reported or observed  Perception WDL  Hallucination None reported or observed  Judgment Poor  Confusion None  Danger to Self  Current suicidal ideation? Denies  Self-Injurious Behavior No self-injurious ideation or behavior indicators observed or expressed   Agreement Not to Harm Self Yes  Description of Agreement verbal  Danger to Others  Danger to Others None reported or observed   Alert/oriented x xx. Makes needs/concerns known to staff. Pleasant cooperative with staff. Denies SI/HI/A/V hallucinations. Med compliant. PRN med given with good effect. Patient states went to group. Will encourage continue compliance and progression towards goals. Verbally contracted for safety. Will continue to monitor.

## 2022-12-27 NOTE — Progress Notes (Signed)
[facility] MD Progress Note  12/27/2022 4:43 PM [name]  MRN:  [id number] Principal Problem: MDD (major depressive disorder), recurrent severe, without psychosis ([place]) Diagnosis: Principal Problem:   MDD (major depressive disorder), recurrent severe, without psychosis ([place]) Active Problems:   GAD (generalized anxiety disorder)   Insomnia   MDD (major depressive disorder), recurrent episode, severe ([place])   Alcohol use disorder   Asthma  Reason For Admission: [name] is a 57 yo Caucasian female with prior mental health history of MDD, ADHD, GAD & alcohol use d/o who presented to the [facility] health urgent care [facility]) with complaints of worsening depressive symptoms with suicidal ideations with a plan to overdose on her medications in the context of financial stressors and alcohol abuse. Pt was transferred voluntarily to this [facility] hospital for treatment and stabilization of her mood.    24-hour chart review: Vital signs within normal limits.  Compliant with all medications, required trazodone 50 mg last night for insomnia, required Tylenol 650 mg earlier today morning for sleep.  Attended 1 unit group session in the last 24 hours.  Patient assessment note 12/27/2022: Pt remains with a very depressed mood, and affect is congruent.  Her attention to personal hygiene and grooming is fair, eye contact is good, speech is clear & coherent. Thought contents are organized and logical, and pt denies SI/HI/AVH or paranoia. There is no evidence of delusional thoughts.  She reports that she is trying to attend unit group sessions in order to learn coping mechanisms for her anxiety and depression.  During this encounter, she talked about having plans to find another job when she is eventually discharged, she talked about wanting to write a daily routine on what she intends to do, she talked about being concerned about her COBRA payments due to her job ending.  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  Writer talked to her about the need to stay focused on recovery, and consider going back to inpatient rehab or joining [facility] and getting a sponsor.  Patient stated that she will think about the above.  Patient reports a good sleep quality last night, reports a good appetite, reported migraines.  She stated that Imitrex is helpful, ask for this medication to be ordered for her.  Order placed for this medication.  Attending psychiatrist talked to patient about acamprosate for management of alcohol use disorder, and she was agreeable to this medication.  Education provided to patient on rationales, benefits, and possible side effects of medication and patient was agreeable to medication trial.  Ordering medications listed above as well as continuing those listed below.  Patient is continuing to require inpatient hospitalization at this time for treatment and stabilization of her mood.  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   History of anemia  Severe episode of recurrent major depressive disorder, without psychotic features ([place]) 01/07/2020   Sleep apnea    mild   SUI (stress urinary incontinence, female)     Past Surgical History:  Procedure Laterality Date   BLADDER SUSPENSION N/A 02/13/2013   Procedure: [facility];  Surgeon: Malka So, MD;  Location: [facility];   Service: Urology;  Laterality: N/A;   CESAREAN SECTION  12-23-2004    W/ LEFT TUBAL LIGATION AND RIGHT SALPINGECTOMY   CESAREAN SECTION  1995   CYSTOSCOPY WITH STENT PLACEMENT Bilateral 06/16/2020   Procedure: CYSTOSCOPY WITH FIREFLY INJECTION;  Surgeon: Lucas Mallow, MD;  Location: [facility];  Service: Urology;  Laterality: Bilateral;   IR RADIOLOGIST EVAL & MGMT  04/21/2020   IR RADIOLOGIST EVAL & MGMT  05/12/2020   ORIF HUMERUS FRACTURE Right 04/28/2014   Procedure: OPEN REDUCTION INTERNAL FIXATION (ORIF) PROXIMAL HUMERUS FRACTURE;  Surgeon: Nita Sells, MD;  Location: [place];  Service: Orthopedics;  Laterality: Right;   ORIF PROXIMAL HUMERUS FRACTURE Right 04/28/2014   DR CHANDLER   PROCTOSCOPY N/A 06/16/2020   Procedure: RIGID PROCTOSCOPY;  Surgeon: Michael Boston, MD;  Location: [facility];  Service: General;  Laterality: N/A;   TUBAL LIGATION     Family History:  Family History  Adopted: Yes   Family Psychiatric  History: See H & P Social History:  Social History   Substance and Sexual Activity  Alcohol Use Yes   Alcohol/week: 2.0 standard drinks of alcohol   Types: 2 Glasses of wine per week   Comment: 12/29/20 restarted      Social History   Substance and Sexual Activity  Drug Use No    Social History   Socioeconomic History   Marital status: Married    Spouse name: Not on file   Number of children: Not on file   Years of education: Not on file   Highest education level: Not on file  Occupational History   Not on file  Tobacco Use   Smoking status: Former    Packs/day: 0.25    Years: 20.00    Total pack years: 5.00    Types: Cigarettes    Quit date: 09/24/2019    Years since quitting: 3.2   Smokeless tobacco: Never  Vaping Use   Vaping Use: Every day  Substance and Sexual Activity   Alcohol use: Yes    Alcohol/week: 2.0 standard drinks of alcohol    Types: 2 Glasses of wine per week    Comment: 12/29/20 restarted    Drug use: No   Sexual  activity: Not Currently    Birth control/protection: Post-menopausal  Other Topics Concern   Not on file  Social History Narrative   Not on file   Social Determinants of Health   Financial Resource Strain: Not on file  Food Insecurity: Food Insecurity Present (12/25/2022)   Hunger Vital Sign    Worried About Running Out of Food in the Last Year: Sometimes true    Ran Out of Food in the Last Year: Sometimes true  Transportation Needs: No Transportation Needs (12/25/2022)   PRAPARE - Hydrologist (Medical): No    Lack of Transportation (Non-Medical): No  Physical Activity: Not on file  Stress: Not on file  Social Connections: Not on file   Sleep: Good  Appetite:  Good  Current Medications: Current Facility-Administered Medications  Medication Dose Route Frequency Provider Last Rate Last Admin   acamprosate (  CAMPRAL) tablet 666 mg  666 mg Oral TID WC Massengill, Ovid Curd, MD       acetaminophen (TYLENOL) tablet 650 mg  650 mg Oral Q6H PRN Revonda Humphrey, NP   650 mg at 12/27/22 0934   albuterol (VENTOLIN HFA) 108 (90 Base) MCG/ACT inhaler 2 puff  2 puff Inhalation PRN Revonda Humphrey, NP       alum & mag hydroxide-simeth (MAALOX/MYLANTA) 200-200-20 MG/5ML suspension 30 mL  30 mL Oral Q4H PRN Revonda Humphrey, NP       Derrill Memo ON 12/28/2022] escitalopram (LEXAPRO) tablet 10 mg  10 mg Oral Daily Delfino Friesen, NP       gabapentin (NEURONTIN) capsule 200 mg  200 mg Oral Q8H Massengill, Nathan, MD   200 mg at 12/27/22 1312   hydrOXYzine (ATARAX) tablet 25 mg  25 mg Oral Q6H PRN Revonda Humphrey, NP       lamoTRIgine (LAMICTAL) tablet 25 mg  25 mg Oral Daily Thomes Lolling H, NP   25 mg at 12/27/22 0931   loperamide (IMODIUM) capsule 2-4 mg  2-4 mg Oral PRN Revonda Humphrey, NP       LORazepam (ATIVAN) tablet 1 mg  1 mg Oral Q6H PRN Revonda Humphrey, NP       LORazepam (ATIVAN) tablet 1 mg  1 mg Oral TID Revonda Humphrey, NP   1 mg at 12/27/22  1311   Followed by   Derrill Memo ON 12/28/2022] LORazepam (ATIVAN) tablet 1 mg  1 mg Oral BID Revonda Humphrey, NP       Followed by   Derrill Memo ON 12/29/2022] LORazepam (ATIVAN) tablet 1 mg  1 mg Oral Daily Revonda Humphrey, NP       magnesium hydroxide (MILK OF MAGNESIA) suspension 30 mL  30 mL Oral Daily PRN Revonda Humphrey, NP       mometasone-formoterol (DULERA) 200-5 MCG/ACT inhaler 2 puff  2 puff Inhalation BID Massengill, Ovid Curd, MD   2 puff at 12/27/22 1000   multivitamin with minerals tablet 1 tablet  1 tablet Oral Daily Revonda Humphrey, NP   1 tablet at 12/27/22 0930   nicotine (NICODERM CQ - dosed in mg/24 hr) patch 7 mg  7 mg Transdermal Daily Massengill, Ovid Curd, MD   7 mg at 12/27/22 1093   nicotine polacrilex (NICORETTE) gum 2 mg  2 mg Oral PRN Massengill, Ovid Curd, MD       ondansetron (ZOFRAN-ODT) disintegrating tablet 4 mg  4 mg Oral Q6H PRN Revonda Humphrey, NP       progesterone (PROMETRIUM) capsule 200 mg  200 mg Oral Daily Thomes Lolling H, NP   200 mg at 12/27/22 0930   propranolol (INDERAL) tablet 10 mg  10 mg Oral Q8H Massengill, Nathan, MD   10 mg at 12/26/22 1525   SUMAtriptan (IMITREX) tablet 25 mg  25 mg Oral Q2H PRN Massengill, Ovid Curd, MD       thiamine (Vitamin B-1) tablet 100 mg  100 mg Oral Daily Thomes Lolling H, NP   100 mg at 12/27/22 0930   traZODone (DESYREL) tablet 50 mg  50 mg Oral QHS PRN Revonda Humphrey, NP   50 mg at 12/26/22 2146    Lab Results:  Results for orders placed or performed during the hospital encounter of 12/25/22 (from the past 48 hour(s))  Urinalysis, Complete w Microscopic     Status: Abnormal   Collection Time: 12/26/22  5:20 PM  Result Value Ref  Range   Color, Urine YELLOW YELLOW   APPearance HAZY (A) CLEAR   Specific Gravity, Urine 1.014 1.005 - 1.030   pH 8.0 5.0 - 8.0   Glucose, UA NEGATIVE NEGATIVE mg/dL   Hgb urine dipstick NEGATIVE NEGATIVE   Bilirubin Urine NEGATIVE NEGATIVE   Ketones, ur NEGATIVE NEGATIVE mg/dL    Protein, ur NEGATIVE NEGATIVE mg/dL   Nitrite NEGATIVE NEGATIVE   Leukocytes,Ua NEGATIVE NEGATIVE   RBC / HPF 0-5 0 - 5 RBC/hpf   WBC, UA 0-5 0 - 5 WBC/hpf   Bacteria, UA NONE SEEN NONE SEEN   Squamous Epithelial / HPF 0-5 0 - 5 /HPF   Amorphous Crystal PRESENT     Comment: Performed at [facility], [place] [address] [place], [place] [address]    Blood Alcohol level:  Lab Results  Component Value Date   ETH 188 (H) 12/25/2022   ETH 168 (H) 37/85/8850    Metabolic Disorder Labs: Lab Results  Component Value Date   HGBA1C 5.2 12/25/2022   MPG 102.54 12/25/2022   MPG 114.02 01/06/2021   No results found for: "PROLACTIN" Lab Results  Component Value Date   CHOL 223 (H) 12/25/2022   TRIG 57 12/25/2022   HDL 128 12/25/2022   CHOLHDL 1.7 12/25/2022   VLDL 11 12/25/2022   LDLCALC 84 12/25/2022   LDLCALC 82 01/06/2021    Physical Findings: AIMS: Facial and Oral Movements Muscles of Facial Expression: None, normal Lips and Perioral Area: None, normal Jaw: None, normal Tongue: None, normal,Extremity Movements Upper (arms, wrists, hands, fingers): None, normal Lower (legs, knees, ankles, toes): None, normal, Trunk Movements Neck, shoulders, hips: None, normal, Overall Severity Severity of abnormal movements (highest score from questions above): None, normal Incapacitation due to abnormal movements: None, normal Patient's awareness of abnormal movements (rate only patient's report): No Awareness, Dental Status Current problems with teeth and/or dentures?: No Does patient usually wear dentures?: No  CIWA:  CIWA-Ar Total: 0 COWS:     Musculoskeletal: Strength & Muscle Tone: within normal limits Gait & Station: normal Patient leans: N/A  Psychiatric Specialty Exam:  Presentation  General Appearance:  Appropriate for Environment; Fairly Groomed  Eye Contact: Good  Speech: Clear and Coherent  Speech  Volume: Normal  Handedness: Right   Mood and Affect  Mood: Depressed; Anxious  Affect: Congruent   Thought Process  Thought Processes: Coherent  Descriptions of Associations:Intact  Orientation:Full (Time, Place and Person)  Thought Content:Logical  History of Schizophrenia/Schizoaffective disorder:No  Duration of Psychotic Symptoms:No data recorded Hallucinations:Hallucinations: None  Ideas of Reference:None  Suicidal Thoughts:Suicidal Thoughts: No SI Passive Intent and/or Plan: Without Intent; Without Plan  Homicidal Thoughts:Homicidal Thoughts: No   Sensorium  Memory: Immediate Good  Judgment: Fair  Insight: Fair   Materials engineer: Fair  Attention Span: Fair  Recall: [employer] of Knowledge: Fair  Language: Fair  Psychomotor Activity  Psychomotor Activity: Psychomotor Activity: Normal  Assets  Assets: Communication Skills  Sleep  Sleep: Sleep: Good  Physical Exam: Physical Exam Constitutional:      Appearance: Normal appearance.  HENT:     Head: Normocephalic.  Eyes:     Pupils: Pupils are equal, round, and reactive to light.  Musculoskeletal:     Cervical back: Normal range of motion.  Neurological:     Mental Status: She is alert and oriented to person, place, and time.    Review of Systems  Constitutional: Negative.   HENT: Negative.    Eyes:  Negative.   Respiratory: Negative.    Cardiovascular: Negative.   Gastrointestinal: Negative.   Genitourinary: Negative.   Musculoskeletal: Negative.   Skin: Negative.   Neurological: Negative.   Psychiatric/Behavioral:  Positive for depression and substance abuse. Negative for hallucinations, memory loss and suicidal ideas. The patient is nervous/anxious and has insomnia.    Blood pressure 112/73, pulse 87, temperature 98.4 F (36.9 C), temperature source Oral, resp. rate 16, height '5\' 5"'$  (1.651 m), weight 60.8 kg, SpO2 98 %. Body mass index is 22.3  kg/m.  Treatment Plan Summary: Treatment Plan Summary: Daily contact with patient to assess and evaluate symptoms and progress in treatment and Medication management   Observation Level/Precautions:  15 minute checks  Laboratory:  Labs reviewed   Psychotherapy:  Unit Group sessions  Medications:  See [facility]  Consultations:  To be determined   Discharge Concerns:  Safety, medication compliance, mood stability  Estimated LOS: 5-7 days  Other:  N/A    Labs reviewed on 12/26/2022: UA with trace leukocytes, few bacteria, hazy in appearance, will repeat.  TSH WNL, lipid panel with cholesterol 223 otherwise WNL, BAL on admission 188, hemoglobin A1c WNL, respiratory panel WNL, CMP WNL, CBC WNL.  EKG with QTc of 435.   PLAN Safety and Monitoring: Voluntary admission to inpatient psychiatric unit for safety, stabilization and treatment Daily contact with patient to assess and evaluate symptoms and progress in treatment Patient's case to be discussed in multi-disciplinary team meeting Observation Level : q15 minute checks Vital signs: q12 hours Precautions: Safety   Long Term Goal(s): Improvement in symptoms so as ready for discharge   Short Term Goals: Ability to identify changes in lifestyle to reduce recurrence of condition will improve, Ability to disclose and discuss suicidal ideas, Ability to demonstrate self-control will improve, Ability to identify and develop effective coping behaviors will improve, Compliance with prescribed medications will improve, and Ability to identify triggers associated with substance abuse/mental health issues will improve   Diagnoses:  Principal Problem:   MDD (major depressive disorder), recurrent severe, without psychosis ([place]) Active Problems:   GAD (generalized anxiety disorder)   Insomnia   MDD (major depressive disorder), recurrent episode, severe ([facility])   Alcohol use disorder   Asthma   Medications -Discontinued Prozac on admission due to lack of  effectiveness -Continue Lexapro 10 mg for depressive symptoms (patient states medication was effective in the past for management of depressive symptoms) -Continue Ativan detox protocol as per MAR-please see MAR for complete order -Continue Lamictal 25 mg daily for mood stabilization (home medication) -Continue gabapentin: Started at 200 mg every 8 hours for GAD (home medication) -Continue albuterol inhaler as needed for wheezing and shortness of breath -Continue Inderal 10 mg every 8 hours for tachycardia and anxiety -Continue trazodone 50 mg nightly as needed for anxiety -Continue progesterone 200 mg daily (home medication)   Other PRNS -Start Imitrex PRN for migraines -Continue Tylenol 650 mg every 6 hours PRN for mild pain -Continue Maalox 30 mg every 4 hrs PRN for indigestion -Continue Imodium 2-4 mg as needed for diarrhea -Continue Milk of Magnesia as needed every 6 hrs for constipation -Continue Zofran disintegrating tabs every 6 hrs PRN for nausea    Discharge Planning: Social work and case management to assist with discharge planning and identification of hospital follow-up needs prior to discharge Estimated LOS: 5-7 days Discharge Concerns: Need to establish a safety plan; Medication compliance and effectiveness Discharge Goals: Return home with outpatient referrals for mental health follow-up including  medication management/psychotherapy   I certify that inpatient services furnished can reasonably be expected to improve the patient's condition.    Nicholes Rough, NP 12/27/2022, 4:43 PM

## 2022-12-27 NOTE — BH IP Treatment Plan (Signed)
Interdisciplinary Treatment and Diagnostic Plan Update  12/27/2022 Time of Session: 9:55am  [name] MRN: [id number]  Principal Diagnosis: MDD (major depressive disorder), recurrent severe, without psychosis ([place])  Secondary Diagnoses: Principal Problem:   MDD (major depressive disorder), recurrent severe, without psychosis ([place]) Active Problems:   GAD (generalized anxiety disorder)   Insomnia   MDD (major depressive disorder), recurrent episode, severe ([place])   Alcohol use disorder   Asthma   Current Medications:  Current Facility-Administered Medications  Medication Dose Route Frequency Provider Last Rate Last Admin   acamprosate (CAMPRAL) tablet 666 mg  666 mg Oral TID WC Massengill, Nathan, MD       acetaminophen (TYLENOL) tablet 650 mg  650 mg Oral Q6H PRN Revonda Humphrey, NP   650 mg at 12/27/22 0934   albuterol (VENTOLIN HFA) 108 (90 Base) MCG/ACT inhaler 2 puff  2 puff Inhalation PRN Revonda Humphrey, NP       alum & mag hydroxide-simeth (MAALOX/MYLANTA) 200-200-20 MG/5ML suspension 30 mL  30 mL Oral Q4H PRN Revonda Humphrey, NP       gabapentin (NEURONTIN) capsule 200 mg  200 mg Oral Q8H Massengill, Nathan, MD   200 mg at 12/27/22 1312   hydrOXYzine (ATARAX) tablet 25 mg  25 mg Oral Q6H PRN Revonda Humphrey, NP       lamoTRIgine (LAMICTAL) tablet 25 mg  25 mg Oral Daily Thomes Lolling H, NP   25 mg at 12/27/22 0931   loperamide (IMODIUM) capsule 2-4 mg  2-4 mg Oral PRN Revonda Humphrey, NP       LORazepam (ATIVAN) tablet 1 mg  1 mg Oral Q6H PRN Revonda Humphrey, NP       LORazepam (ATIVAN) tablet 1 mg  1 mg Oral TID Revonda Humphrey, NP   1 mg at 12/27/22 1311   Followed by   Derrill Memo ON 12/28/2022] LORazepam (ATIVAN) tablet 1 mg  1 mg Oral BID Revonda Humphrey, NP       Followed by   Derrill Memo ON 12/29/2022] LORazepam (ATIVAN) tablet 1 mg  1 mg Oral Daily Revonda Humphrey, NP       magnesium hydroxide (MILK OF MAGNESIA) suspension 30 mL  30 mL Oral Daily  PRN Revonda Humphrey, NP       mometasone-formoterol (DULERA) 200-5 MCG/ACT inhaler 2 puff  2 puff Inhalation BID Massengill, Ovid Curd, MD   2 puff at 12/27/22 1000   multivitamin with minerals tablet 1 tablet  1 tablet Oral Daily Revonda Humphrey, NP   1 tablet at 12/27/22 0930   nicotine (NICODERM CQ - dosed in mg/24 hr) patch 7 mg  7 mg Transdermal Daily Massengill, Nathan, MD   7 mg at 12/27/22 0931   nicotine polacrilex (NICORETTE) gum 2 mg  2 mg Oral PRN Massengill, Ovid Curd, MD       ondansetron (ZOFRAN-ODT) disintegrating tablet 4 mg  4 mg Oral Q6H PRN Revonda Humphrey, NP       progesterone (PROMETRIUM) capsule 200 mg  200 mg Oral Daily Thomes Lolling H, NP   200 mg at 12/27/22 0930   propranolol (INDERAL) tablet 10 mg  10 mg Oral Q8H Massengill, Nathan, MD   10 mg at 12/26/22 1525   SUMAtriptan (IMITREX) tablet 25 mg  25 mg Oral Q2H PRN Massengill, Ovid Curd, MD       thiamine (Vitamin B-1) tablet 100 mg  100 mg Oral Daily Revonda Humphrey, NP   100  mg at 12/27/22 0930   traZODone (DESYREL) tablet 50 mg  50 mg Oral QHS PRN Revonda Humphrey, NP   50 mg at 12/26/22 2146   PTA Medications: Medications Prior to Admission  Medication Sig Dispense Refill Last Dose   acamprosate (CAMPRAL) 333 MG tablet Take 666 mg by mouth 3 (three) times daily with meals.      busPIRone (BUSPAR) 7.5 MG tablet Take 7.5 mg by mouth 2 (two) times daily.      gabapentin (NEURONTIN) 600 MG tablet Take 600 mg by mouth 2 (two) times daily.      hydrOXYzine (VISTARIL) 50 MG capsule Take 50 mg by mouth in the morning and at bedtime.      lamoTRIgine (LAMICTAL) 100 MG tablet Take 50 mg by mouth 2 (two) times daily.      progesterone (PROMETRIUM) 200 MG capsule Take 1 capsule (200 mg total) by mouth daily. 30 capsule 0    SUMAtriptan (IMITREX) 100 MG tablet Take 100 mg by mouth every 2 (two) hours as needed for migraine. May repeat in 2 hours if headache persists or recurs. Max 2 tablets in 24 hr      traZODone  (DESYREL) 50 MG tablet Take 2 tablets (100 mg total) by mouth at bedtime as needed for sleep. (Patient taking differently: Take 50 mg by mouth at bedtime as needed for sleep.)      PROAIR HFA 108 (90 Base) MCG/ACT inhaler Inhale 2 puffs into the lungs as needed for wheezing or shortness of breath.       Patient Stressors:    Patient Strengths:    Treatment Modalities: Medication Management, Group therapy, Case management,  1 to 1 session with clinician, Psychoeducation, Recreational therapy.   Physician Treatment Plan for Primary Diagnosis: MDD (major depressive disorder), recurrent severe, without psychosis ([place]) Long Term Goal(s): Improvement in symptoms so as ready for discharge   Short Term Goals: Ability to identify changes in lifestyle to reduce recurrence of condition will improve Ability to disclose and discuss suicidal ideas Ability to demonstrate self-control will improve Ability to identify and develop effective coping behaviors will improve Compliance with prescribed medications will improve Ability to identify triggers associated with substance abuse/mental health issues will improve  Medication Management: Evaluate patient's response, side effects, and tolerance of medication regimen.  Therapeutic Interventions: 1 to 1 sessions, Unit Group sessions and Medication administration.  Evaluation of Outcomes: Not Met  Physician Treatment Plan for Secondary Diagnosis: Principal Problem:   MDD (major depressive disorder), recurrent severe, without psychosis ([place]) Active Problems:   GAD (generalized anxiety disorder)   Insomnia   MDD (major depressive disorder), recurrent episode, severe ([place])   Alcohol use disorder   Asthma  Long Term Goal(s): Improvement in symptoms so as ready for discharge   Short Term Goals: Ability to identify changes in lifestyle to reduce recurrence of condition will improve Ability to disclose and discuss suicidal ideas Ability to demonstrate  self-control will improve Ability to identify and develop effective coping behaviors will improve Compliance with prescribed medications will improve Ability to identify triggers associated with substance abuse/mental health issues will improve     Medication Management: Evaluate patient's response, side effects, and tolerance of medication regimen.  Therapeutic Interventions: 1 to 1 sessions, Unit Group sessions and Medication administration.  Evaluation of Outcomes: Not Met   RN Treatment Plan for Primary Diagnosis: MDD (major depressive disorder), recurrent severe, without psychosis ([place]) Long Term Goal(s): Knowledge of disease and therapeutic regimen to maintain  health will improve  Short Term Goals: Ability to remain free from injury will improve, Ability to participate in decision making will improve, Ability to verbalize feelings will improve, Ability to disclose and discuss suicidal ideas, and Ability to identify and develop effective coping behaviors will improve  Medication Management: RN will administer medications as ordered by provider, will assess and evaluate patient's response and provide education to patient for prescribed medication. RN will report any adverse and/or side effects to prescribing provider.  Therapeutic Interventions: 1 on 1 counseling sessions, Psychoeducation, Medication administration, Evaluate responses to treatment, Monitor vital signs and CBGs as ordered, Perform/monitor CIWA, COWS, AIMS and Fall Risk screenings as ordered, Perform wound care treatments as ordered.  Evaluation of Outcomes: Not Met   LCSW Treatment Plan for Primary Diagnosis: MDD (major depressive disorder), recurrent severe, without psychosis ([place]) Long Term Goal(s): Safe transition to appropriate next level of care at discharge, Engage patient in therapeutic group addressing interpersonal concerns.  Short Term Goals: Engage patient in aftercare planning with referrals and resources,  Increase social support, Increase emotional regulation, Facilitate acceptance of mental health diagnosis and concerns, Identify triggers associated with mental health/substance abuse issues, and Increase skills for wellness and recovery  Therapeutic Interventions: Assess for all discharge needs, 1 to 1 time with Social worker, Explore available resources and support systems, Assess for adequacy in community support network, Educate family and significant other(s) on suicide prevention, Complete Psychosocial Assessment, Interpersonal group therapy.  Evaluation of Outcomes: Not Met   Progress in Treatment: Attending groups: Yes. Participating in groups: Yes. Taking medication as prescribed: Yes. Toleration medication: Yes. Family/Significant other contact made: Yes, individual(s) contacted:  Mother  Patient understands diagnosis: Yes. Discussing patient identified problems/goals with staff: Yes. Medical problems stabilized or resolved: Yes. Denies suicidal/homicidal ideation: Yes. Issues/concerns per patient self-inventory: No.   New problem(s) identified: No, Describe:  None  New Short Term/Long Term Goal(s): medication stabilization, elimination of SI thoughts, development of comprehensive mental wellness plan.   Patient Goals: "To go to groups, develop a routine, and learn coping skills"  Discharge Plan or Barriers: Patient recently admitted. CSW will continue to follow and assess for appropriate referrals and possible discharge planning.   Reason for Continuation of Hospitalization: Anxiety Depression Medication stabilization Suicidal ideation Withdrawal symptoms  Estimated Length of Stay: 3 to 7 days   Last [place] Suicide Severity Risk Score: [place] Admission (Current) from 12/25/2022 in [place] [place] [facility] Most recent reading at 12/25/2022 11:33 PM ED from 12/25/2022 in [facility] Most recent reading at  12/25/2022  4:06 PM ED from 12/22/2021 in [facility] Emergency Department at [facility] Most recent reading at 12/22/2021 10:20 AM  C-SSRS RISK CATEGORY Low Risk Moderate Risk No Risk       Last PHQ 2/9 Scores:     No data to display          Scribe for Treatment Team: Darleen Crocker, Latanya Presser 12/27/2022 1:46 PM

## 2022-12-27 NOTE — BHH Counselor (Signed)
CSW spoke with the Pt about discharge planning.  The Pt states that she does not want to attend a residential treatment facility at this time. The Pt states "I have to work and I have financial struggles that I cannot be away from".  She states I don't feel like inpatient is what I need at this time".  The Pt did agree to receive the phone number to [facility] and a list of [facility] in the [place] area.  CSW provided the Pt with these listings and encouraged the Pt to begin calling.  The Pt states that she will return to her home after discharge and will look for a sober living home during that time.

## 2022-12-27 NOTE — Plan of Care (Signed)
  Problem: Education: Goal: Knowledge of disease or condition will improve Outcome: Progressing Goal: Understanding of discharge needs will improve Outcome: Progressing   Problem: Health Behavior/Discharge Planning: Goal: Ability to identify changes in lifestyle to reduce recurrence of condition will improve Outcome: Progressing Goal: Identification of resources available to assist in meeting health care needs will improve Outcome: Progressing   Problem: Physical Regulation: Goal: Complications related to the disease process, condition or treatment will be avoided or minimized Outcome: Progressing   Problem: Safety: Goal: Ability to remain free from injury will improve Outcome: Progressing

## 2022-12-27 NOTE — Group Note (Signed)
Recreation Therapy Group Note   Group Topic:Problem Solving  Group Date: 12/27/2022 Start Time: 0930 End Time: 0950 Facilitators: Sesilia Poucher-McCall, LRT,CTRS Location: [address] Day[address]   Goal Area(s) Addresses:  Patient will effectively work with peer towards shared goal.  Patient will identify skill used to make activity successful.  Patient will identify how skills used during activity can be used to reach post d/c goals.    Group Description: Patient(s) were given a set of 10 solo cups, a rubber band, and some tied strings. The objective is to build a pyramid with the cups by only using the rubber band and string to move the cups. After the activity the patient(s) are LRT debriefed and discussed what strategies worked, what didn't, and what lessons they can take from the activity and use in life post discharge.      Affect/Mood: Flat   Participation Level: None   Participation Quality: None   Behavior: Appropriate   Speech/Thought Process: Focused   Insight: Moderate   Judgement: Moderate   Modes of Intervention: Group work   Patient Response to Interventions:  Attentive   Education Outcome:  Acknowledges education and In group clarification offered    Clinical Observations/Individualized Feedback: Pt stayed for a few minutes.  Pt expressed she was cold and wanted a blanket.  Pt left and did not return.     Plan: Continue to engage patient in RT group sessions 2-3x/week.   Jaisean Monteforte-McCall, LRT,CTRS 12/27/2022 12:25 PM

## 2022-12-28 DIAGNOSIS — F332 Major depressive disorder, recurrent severe without psychotic features: Secondary | ICD-10-CM | POA: Diagnosis not present

## 2022-12-28 MED ORDER — HYDROXYZINE HCL 25 MG PO TABS
25.0000 mg | ORAL_TABLET | Freq: Four times a day (QID) | ORAL | Status: DC | PRN
  Filled 2022-12-28 (×4): qty 14

## 2022-12-28 NOTE — Group Note (Signed)
[facility] LCSW Group Therapy Note   Group Date: 12/28/2022 Start Time: 1100 End Time: 1200   Type of Therapy and Topic: Group Therapy: Avoiding Self-Sabotaging and Enabling Behaviors  Participation Level: Did Not Attend  Mood:  Description of Group:  In this group, patients will learn how to identify obstacles, self-sabotaging and enabling behaviors, as well as: what are they, why do we do them and what needs these behaviors meet. Discuss unhealthy relationships and how to have positive healthy boundaries with those that sabotage and enable. Explore aspects of self-sabotage and enabling in yourself and how to limit these self-destructive behaviors in everyday life.   Therapeutic Goals: 1. Patient will identify one obstacle that relates to self-sabotage and enabling behaviors 2. Patient will identify one personal self-sabotaging or enabling behavior they did prior to admission 3. Patient will state a plan to change the above identified behavior 4. Patient will demonstrate ability to communicate their needs through discussion and/or role play.    Therapeutic Modalities:  Cognitive Behavioral Therapy Person-Centered Therapy Motivational Interviewing    Dimples Probus S Debora [place], LCSW

## 2022-12-28 NOTE — Plan of Care (Signed)
  Problem: Health Behavior/Discharge Planning: Goal: Ability to identify changes in lifestyle to reduce recurrence of condition will improve Outcome: Progressing Goal: Identification of resources available to assist in meeting health care needs will improve Outcome: Progressing   Problem: Safety: Goal: Ability to remain free from injury will improve Outcome: Progressing

## 2022-12-28 NOTE — Progress Notes (Signed)
   12/28/22 1600  Psychosocial Assessment  Patient Complaints Depression  Eye Contact Fair  Facial Expression Anxious;Sad  Affect Anxious  Speech Logical/coherent;Soft  Interaction Guarded;Minimal  Motor Activity Other (Comment) (WDL)  Appearance/Hygiene Unremarkable  Behavior Characteristics Cooperative;Appropriate to situation  Mood Depressed;Anxious  Thought Process  Coherency WDL  Content WDL  Delusions None reported or observed  Perception WDL  Hallucination None reported or observed  Judgment Impaired  Confusion None  Danger to Self  Current suicidal ideation? Denies  Self-Injurious Behavior No self-injurious ideation or behavior indicators observed or expressed   Agreement Not to Harm Self Yes  Description of Agreement verbal  Danger to Others  Danger to Others None reported or observed

## 2022-12-28 NOTE — BHH Group Notes (Signed)
Pt did not attend wrap-up group   

## 2022-12-28 NOTE — Progress Notes (Signed)
   12/27/22 2113  Psych Admission Type (Psych Patients Only)  Admission Status Voluntary  Psychosocial Assessment  Patient Complaints Anxiety  Eye Contact Fair  Facial Expression Anxious  Affect Anxious  Speech Logical/coherent  Interaction Assertive  Motor Activity Other (Comment) (WDL)  Appearance/Hygiene Unremarkable  Behavior Characteristics Cooperative;Appropriate to situation  Mood Anxious  Thought Process  Coherency WDL  Content WDL  Delusions None reported or observed  Perception WDL  Hallucination None reported or observed  Judgment Impaired  Confusion None  Danger to Self  Current suicidal ideation? Denies  Self-Injurious Behavior No self-injurious ideation or behavior indicators observed or expressed   Agreement Not to Harm Self Yes  Description of Agreement Verbal  Danger to Others  Danger to Others None reported or observed

## 2022-12-28 NOTE — Progress Notes (Signed)
[facility] MD Progress Note  12/28/2022 3:12 PM [name]  MRN:  [id number] Principal Problem: MDD (major depressive disorder), recurrent severe, without psychosis ([place]) Diagnosis: Principal Problem:   MDD (major depressive disorder), recurrent severe, without psychosis ([place]) Active Problems:   GAD (generalized anxiety disorder)   Insomnia   MDD (major depressive disorder), recurrent episode, severe ([place])   Alcohol use disorder   Asthma  Reason For Admission: [name] is a 57 yo Caucasian female with prior mental health history of MDD, ADHD, GAD & alcohol use d/o who presented to the [facility] health urgent care [facility]) with complaints of worsening depressive symptoms with suicidal ideations with a plan to overdose on her medications in the context of financial stressors and alcohol abuse. Pt was transferred voluntarily to this [facility] health hospital for treatment and stabilization of her mood.    24-hour chart review: Vital signs continue to be within normal limits.  Pt remains compliant with all medications, required trazodone 50 mg last night for insomnia, required Tylenol 650 mg earlier today morning for pain.  Patient is continuing to attend unit group sessions and documented sleep hours last night 9 hours.  No behavioral concerns noted in the last 24 hours.  Patient assessment note 12/28/2022: Mood remains very depressed, attention to personal hygiene and grooming is poor, patient seems unmotivated, prefers to stay in bed all day, complaining today of feeling tired and dizzy.  The combination of trazodone and hydroxyzine given last night for insomnia and anxiety respectively most likely contributed to feelings of tiredness and dizziness today.  Patient has been educated on the need to tend to personal hygiene needs, she complains of a poor appetite, positive reinforcements given to eat, and so far breakfast and lunch were brought to her by staff who reported that  she had minimal amounts of each.  She reports that energy level today is low, reports her sleep last night was good.  Denies any other medication related side effects other than the tiredness and dizziness.  Patient denies SI, denies HI, denies AVH.  She denies paranoia, and there is no evidence of delusional thinking.  Speech is clear and coherent, thought contents are organized, and logical.  Nursing staff reminded to pass it on tonight for tonight's RN not to administer a combination of trazodone and hydroxyzine for sleep, due to pt's complaints of daytime sedation and grogginess. We are continuing current medications as listed below.  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   History of anemia    Severe episode of recurrent major depressive disorder, without psychotic features ([place]) 01/07/2020   Sleep apnea    mild   SUI (stress urinary incontinence, female)     Past Surgical History:  Procedure Laterality Date   BLADDER SUSPENSION N/A 02/13/2013   Procedure: [facility] SLING;  Surgeon: Malka So, MD;  Location: [facility];  Service: Urology;  Laterality: N/A;   CESAREAN SECTION  12-23-2004    W/ LEFT TUBAL LIGATION AND RIGHT SALPINGECTOMY   CESAREAN SECTION  1995   CYSTOSCOPY WITH STENT PLACEMENT Bilateral 06/16/2020   Procedure: CYSTOSCOPY WITH FIREFLY INJECTION;  Surgeon: Lucas Mallow, MD;  Location: [facility];  Service: Urology;  Laterality: Bilateral;   IR RADIOLOGIST EVAL & MGMT  04/21/2020   IR  RADIOLOGIST EVAL & MGMT  05/12/2020   ORIF HUMERUS FRACTURE Right 04/28/2014   Procedure: OPEN REDUCTION INTERNAL FIXATION (ORIF) PROXIMAL HUMERUS FRACTURE;  Surgeon: Nita Sells, MD;  Location: [place];  Service: Orthopedics;  Laterality: Right;   ORIF PROXIMAL HUMERUS FRACTURE Right 04/28/2014   DR CHANDLER   PROCTOSCOPY N/A 06/16/2020   Procedure: RIGID PROCTOSCOPY;  Surgeon: Michael Boston, MD;  Location: [facility];  Service: General;  Laterality: N/A;   TUBAL LIGATION     Family History:  Family History  Adopted: Yes   Family Psychiatric  History: See H & P Social History:  Social History   Substance and Sexual Activity  Alcohol Use Yes   Alcohol/week: 2.0 standard drinks of alcohol   Types: 2 Glasses of wine per week   Comment: 12/29/20 restarted      Social History   Substance and Sexual Activity  Drug Use No    Social History   Socioeconomic History   Marital status: Married    Spouse name: Not on file   Number of children: Not on file   Years of education: Not on file   Highest education level: Not on file  Occupational History   Not on file  Tobacco Use   Smoking status: Former    Packs/day: 0.25    Years: 20.00    Total pack years: 5.00    Types: Cigarettes    Quit date: 09/24/2019    Years since quitting: 3.2   Smokeless tobacco: Never  Vaping Use   Vaping Use: Every day  Substance and Sexual Activity   Alcohol use: Yes    Alcohol/week: 2.0 standard drinks of alcohol    Types: 2 Glasses of wine per week    Comment: 12/29/20 restarted    Drug use: No   Sexual activity: Not Currently    Birth control/protection: Post-menopausal  Other Topics Concern   Not on file  Social History Narrative   Not on file   Social Determinants of Health   Financial Resource Strain: Not on file  Food Insecurity: Food Insecurity Present (12/25/2022)   Hunger Vital Sign    Worried About Running Out of Food in the Last Year: Sometimes true    Ran Out of Food in the  Last Year: Sometimes true  Transportation Needs: No Transportation Needs (12/25/2022)   PRAPARE - Hydrologist (Medical): No    Lack of Transportation (Non-Medical): No  Physical Activity: Not on file  Stress: Not on file  Social Connections: Not on file   Sleep: Good  Appetite:  Good  Current Medications: Current Facility-Administered Medications  Medication Dose Route Frequency Provider Last Rate Last Admin   acamprosate (CAMPRAL) tablet 666 mg  666 mg Oral TID WC Massengill, Ovid Curd, MD   666 mg at 12/28/22 1156   acetaminophen (TYLENOL) tablet 650 mg  650 mg Oral Q6H PRN Revonda Humphrey, NP   650 mg at 12/28/22 0830   albuterol (VENTOLIN HFA) 108 (90 Base) MCG/ACT inhaler 2 puff  2 puff Inhalation PRN Revonda Humphrey, NP       alum &  mag hydroxide-simeth (MAALOX/MYLANTA) 200-200-20 MG/5ML suspension 30 mL  30 mL Oral Q4H PRN Revonda Humphrey, NP       escitalopram (LEXAPRO) tablet 10 mg  10 mg Oral Daily Nicholes Rough, NP   10 mg at 12/28/22 0831   gabapentin (NEURONTIN) capsule 200 mg  200 mg Oral Q8H Massengill, Ovid Curd, MD   200 mg at 12/28/22 1454   hydrOXYzine (ATARAX) tablet 25 mg  25 mg Oral Q6H PRN Massengill, Ovid Curd, MD       lamoTRIgine (LAMICTAL) tablet 25 mg  25 mg Oral Daily Revonda Humphrey, NP   25 mg at 12/28/22 0830   loperamide (IMODIUM) capsule 2-4 mg  2-4 mg Oral PRN Revonda Humphrey, NP       LORazepam (ATIVAN) tablet 1 mg  1 mg Oral Q6H PRN Revonda Humphrey, NP       LORazepam (ATIVAN) tablet 1 mg  1 mg Oral BID Revonda Humphrey, NP   1 mg at 12/28/22 0831   Followed by   Derrill Memo ON 12/29/2022] LORazepam (ATIVAN) tablet 1 mg  1 mg Oral Daily Revonda Humphrey, NP       magnesium hydroxide (MILK OF MAGNESIA) suspension 30 mL  30 mL Oral Daily PRN Revonda Humphrey, NP       mometasone-formoterol (DULERA) 200-5 MCG/ACT inhaler 2 puff  2 puff Inhalation BID Janine Limbo, MD   2 puff at 12/28/22 2951   multivitamin  with minerals tablet 1 tablet  1 tablet Oral Daily Revonda Humphrey, NP   1 tablet at 12/28/22 0830   nicotine (NICODERM CQ - dosed in mg/24 hr) patch 7 mg  7 mg Transdermal Daily Massengill, Ovid Curd, MD   7 mg at 12/28/22 8841   nicotine polacrilex (NICORETTE) gum 2 mg  2 mg Oral PRN Massengill, Ovid Curd, MD       ondansetron (ZOFRAN-ODT) disintegrating tablet 4 mg  4 mg Oral Q6H PRN Revonda Humphrey, NP       progesterone (PROMETRIUM) capsule 200 mg  200 mg Oral Daily Revonda Humphrey, NP   200 mg at 12/28/22 0830   propranolol (INDERAL) tablet 10 mg  10 mg Oral Q8H Massengill, Ovid Curd, MD   10 mg at 12/28/22 1455   SUMAtriptan (IMITREX) tablet 25 mg  25 mg Oral Q2H PRN Massengill, Ovid Curd, MD       thiamine (Vitamin B-1) tablet 100 mg  100 mg Oral Daily Revonda Humphrey, NP   100 mg at 12/28/22 0830   traZODone (DESYREL) tablet 50 mg  50 mg Oral QHS PRN Nicholes Rough, NP   50 mg at 12/27/22 2113    Lab Results:  Results for orders placed or performed during the hospital encounter of 12/25/22 (from the past 48 hour(s))  Urinalysis, Complete w Microscopic     Status: Abnormal   Collection Time: 12/26/22  5:20 PM  Result Value Ref Range   Color, Urine YELLOW YELLOW   APPearance HAZY (A) CLEAR   Specific Gravity, Urine 1.014 1.005 - 1.030   pH 8.0 5.0 - 8.0   Glucose, UA NEGATIVE NEGATIVE mg/dL   Hgb urine dipstick NEGATIVE NEGATIVE   Bilirubin Urine NEGATIVE NEGATIVE   Ketones, ur NEGATIVE NEGATIVE mg/dL   Protein, ur NEGATIVE NEGATIVE mg/dL   Nitrite NEGATIVE NEGATIVE   Leukocytes,Ua NEGATIVE NEGATIVE   RBC / HPF 0-5 0 - 5 RBC/hpf   WBC, UA 0-5 0 - 5 WBC/hpf   Bacteria, UA NONE SEEN NONE  SEEN   Squamous Epithelial / HPF 0-5 0 - 5 /HPF   Amorphous Crystal PRESENT     Comment: Performed at [facility], [place] [address] [place], [place] [address]    Blood Alcohol level:  Lab Results  Component Value Date   ETH 188 (H) 12/25/2022   ETH 168 (H) 24/26/8341     Metabolic Disorder Labs: Lab Results  Component Value Date   HGBA1C 5.2 12/25/2022   MPG 102.54 12/25/2022   MPG 114.02 01/06/2021   No results found for: "PROLACTIN" Lab Results  Component Value Date   CHOL 223 (H) 12/25/2022   TRIG 57 12/25/2022   HDL 128 12/25/2022   CHOLHDL 1.7 12/25/2022   VLDL 11 12/25/2022   LDLCALC 84 12/25/2022   LDLCALC 82 01/06/2021    Physical Findings: AIMS: Facial and Oral Movements Muscles of Facial Expression: None, normal Lips and Perioral Area: None, normal Jaw: None, normal Tongue: None, normal,Extremity Movements Upper (arms, wrists, hands, fingers): None, normal Lower (legs, knees, ankles, toes): None, normal, Trunk Movements Neck, shoulders, hips: None, normal, Overall Severity Severity of abnormal movements (highest score from questions above): None, normal Incapacitation due to abnormal movements: None, normal Patient's awareness of abnormal movements (rate only patient's report): No Awareness, Dental Status Current problems with teeth and/or dentures?: No Does patient usually wear dentures?: No  CIWA:  CIWA-Ar Total: 1 COWS:     Musculoskeletal: Strength & Muscle Tone: within normal limits Gait & Station: normal Patient leans: N/A  Psychiatric Specialty Exam:  Presentation  General Appearance:  Appropriate for Environment; Fairly Groomed  Eye Contact: Fair  Speech: Clear and Coherent  Speech Volume: Normal  Handedness: Right   Mood and Affect  Mood: Depressed  Affect: Congruent   Thought Process  Thought Processes: Coherent  Descriptions of Associations:Intact  Orientation:Full (Time, Place and Person)  Thought Content:Logical  History of Schizophrenia/Schizoaffective disorder:No  Duration of Psychotic Symptoms:No data recorded Hallucinations:Hallucinations: None  Ideas of Reference:None  Suicidal Thoughts:Suicidal Thoughts: No  Homicidal Thoughts:Homicidal Thoughts:  No   Sensorium  Memory: Immediate Good  Judgment: Fair  Insight: Fair   Materials engineer: Fair  Attention Span: Fair  Recall: [place] of Knowledge: Good  Language: Good  Psychomotor Activity  Psychomotor Activity: Psychomotor Activity: Normal  Assets  Assets: Communication Skills; Resilience  Sleep  Sleep: Sleep: Good  Physical Exam: Physical Exam Constitutional:      Appearance: Normal appearance.  HENT:     Head: Normocephalic.  Eyes:     Pupils: Pupils are equal, round, and reactive to light.  Musculoskeletal:     Cervical back: Normal range of motion.  Neurological:     Mental Status: She is alert and oriented to person, place, and time.    Review of Systems  Constitutional: Negative.   HENT: Negative.    Eyes: Negative.   Respiratory: Negative.    Cardiovascular: Negative.   Gastrointestinal: Negative.   Genitourinary: Negative.   Musculoskeletal: Negative.   Skin: Negative.   Neurological: Negative.   Psychiatric/Behavioral:  Positive for depression and substance abuse. Negative for hallucinations, memory loss and suicidal ideas. The patient is nervous/anxious and has insomnia.    Blood pressure 106/65, pulse 75, temperature 98.7 F (37.1 C), temperature source Oral, resp. rate 16, height '5\' 5"'$  (1.651 m), weight 60.8 kg, SpO2 99 %. Body mass index is 22.3 kg/m.  Treatment Plan Summary: Treatment Plan Summary: Daily contact with patient to assess and evaluate symptoms and  progress in treatment and Medication management   Observation Level/Precautions:  15 minute checks  Laboratory:  Labs reviewed   Psychotherapy:  Unit Group sessions  Medications:  See [facility]  Consultations:  To be determined   Discharge Concerns:  Safety, medication compliance, mood stability  Estimated LOS: 5-7 days  Other:  N/A    Labs reviewed on 12/26/2022: UA with trace leukocytes, few bacteria, hazy in appearance, will repeat.  TSH WNL,  lipid panel with cholesterol 223 otherwise WNL, BAL on admission 188, hemoglobin A1c WNL, respiratory panel WNL, CMP WNL, CBC WNL.  EKG with QTc of 435.   PLAN Safety and Monitoring: Voluntary admission to inpatient psychiatric unit for safety, stabilization and treatment Daily contact with patient to assess and evaluate symptoms and progress in treatment Patient's case to be discussed in multi-disciplinary team meeting Observation Level : q15 minute checks Vital signs: q12 hours Precautions: Safety   Long Term Goal(s): Improvement in symptoms so as ready for discharge   Short Term Goals: Ability to identify changes in lifestyle to reduce recurrence of condition will improve, Ability to disclose and discuss suicidal ideas, Ability to demonstrate self-control will improve, Ability to identify and develop effective coping behaviors will improve, Compliance with prescribed medications will improve, and Ability to identify triggers associated with substance abuse/mental health issues will improve   Diagnoses:  Principal Problem:   MDD (major depressive disorder), recurrent severe, without psychosis ([place]) Active Problems:   GAD (generalized anxiety disorder)   Insomnia   MDD (major depressive disorder), recurrent episode, severe ([facility])   Alcohol use disorder   Asthma   Medications -Continue Campral 666 mg for alcohol use d/o -Discontinued Prozac on admission due to lack of effectiveness -Continue Lexapro 10 mg for depressive symptoms (patient states medication was effective in the past for management of depressive symptoms) -Continue Ativan detox protocol as per MAR-please see MAR for complete order -Continue Lamictal 25 mg daily for mood stabilization (home medication) -Continue gabapentin: Started at 200 mg every 8 hours for GAD (home medication) -Continue albuterol inhaler as needed for wheezing and shortness of breath -Continue Inderal 10 mg every 8 hours for tachycardia and  anxiety -Continue trazodone 50 mg nightly as needed for anxiety -Continue progesterone 200 mg daily (home medication)   Other PRNS -Continue Imitrex PRN for migraines -Continue Tylenol 650 mg every 6 hours PRN for mild pain -Continue Maalox 30 mg every 4 hrs PRN for indigestion -Continue Imodium 2-4 mg as needed for diarrhea -Continue Milk of Magnesia as needed every 6 hrs for constipation -Continue Zofran disintegrating tabs every 6 hrs PRN for nausea    Discharge Planning: Social work and case management to assist with discharge planning and identification of hospital follow-up needs prior to discharge Estimated LOS: 5-7 days Discharge Concerns: Need to establish a safety plan; Medication compliance and effectiveness Discharge Goals: Return home with outpatient referrals for mental health follow-up including medication management/psychotherapy   I certify that inpatient services furnished can reasonably be expected to improve the patient's condition.    Nicholes Rough, NP 12/28/2022, 3:12 PMPatient ID: [name], female   DOB: Apr 06, 1966, 57 y.o.   MRN: [id number]

## 2022-12-28 NOTE — Progress Notes (Signed)
   12/28/22 0713  15 Minute Checks  Location Bedroom  Visual Appearance Calm  Behavior Sleeping  Sleep (Behavioral Health Patients Only)  Calculate sleep? (Click Yes once per 24 hr at 0600 safety check) Yes  Documented sleep last 24 hours 9.25

## 2022-12-28 NOTE — Plan of Care (Signed)
  Problem: Education: Goal: Knowledge of disease or condition will improve Outcome: Progressing Goal: Understanding of discharge needs will improve Outcome: Progressing   Problem: Health Behavior/Discharge Planning: Goal: Identification of resources available to assist in meeting health care needs will improve Outcome: Progressing

## 2022-12-28 NOTE — Progress Notes (Signed)
   12/28/22 0515  15 Minute Checks  Location Bedroom  Visual Appearance Calm  Behavior Sleeping  Sleep (Behavioral Health Patients Only)  Calculate sleep? (Click Yes once per 24 hr at 0600 safety check) Yes  Documented sleep last 24 hours 9

## 2022-12-29 DIAGNOSIS — F332 Major depressive disorder, recurrent severe without psychotic features: Secondary | ICD-10-CM | POA: Diagnosis not present

## 2022-12-29 MED ORDER — IBUPROFEN 400 MG PO TABS
400.0000 mg | ORAL_TABLET | Freq: Four times a day (QID) | ORAL | Status: DC | PRN
  Administered 2022-12-30: 400 mg via ORAL
  Filled 2022-12-29: qty 1

## 2022-12-29 MED ORDER — IBUPROFEN 400 MG PO TABS: 400.0000 mg | ORAL_TABLET | Freq: Four times a day (QID) | ORAL | Status: DC | PRN

## 2022-12-29 MED ORDER — LORAZEPAM 1 MG PO TABS
1.0000 mg | ORAL_TABLET | Freq: Every day | ORAL | Status: AC
  Administered 2022-12-29: 1 mg via ORAL
  Filled 2022-12-29: qty 1

## 2022-12-29 NOTE — Plan of Care (Signed)
  Problem: Safety: Goal: Ability to remain free from injury will improve Outcome: Progressing

## 2022-12-29 NOTE — Progress Notes (Signed)
[facility] MD Progress Note  12/29/2022 3:50 PM [name]  MRN:  [id number] Principal Problem: MDD (major depressive disorder), recurrent severe, without psychosis ([place]) Diagnosis: Principal Problem:   MDD (major depressive disorder), recurrent severe, without psychosis ([place]) Active Problems:   GAD (generalized anxiety disorder)   Insomnia   MDD (major depressive disorder), recurrent episode, severe ([place])   Alcohol use disorder   Asthma  HPI: [name] is a 57 yo Caucasian female with prior mental health history of MDD, ADHD, GAD & alcohol use d/o who presented to the [employer] health urgent care [facility]) with complaints of worsening depressive symptoms with suicidal ideations with a plan to overdose on her medications in the context of financial stressors and alcohol abuse. Pt was transferred voluntarily [place] for treatment and stabilization of her mood.    24-hour chart review: Vital signs remain within normal limits; 109/75, PR 83. Pt medication compliant; PRN Trazodone 50 mg last night for insomnia,Imitrex for migraine/headache. Patient continues to attend unit group activities and remains present in the milieu. Reports sleeping overnight without any issues. No behavioral concerns noted in the last 24 hours.  Assessment: Patient presents sitting in bed reading. Alert and oriented. Casual appearance. Euthymic mood; affect brightens on approach. Calm and cooperative. Good eye contact. Logical and linear thoughts. Reports feeling 'good. Much better than when I got here. I went to classes this morning and they've been very helpful'.  She reports improved energy levels. Discussed plan for discharge tomorrow in which she reports plan to return to her home where a friend will be staying with her for a while; says mom lives across the street. She reports possibly going to [place], or treatment center in [place] if sobriety becomes in jeopardy. She endorses local affiliation  with AA "early bird' meetings and [employer] at her local church. She denies any SI/HI/AVH, cravings, withdrawal symptoms, or any other medication related side effects other than the tiredness and dizziness. Current medication regimen continued as listed below.  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   History of anemia    Severe episode of recurrent major depressive disorder, without psychotic features ([place]) 01/07/2020   Sleep apnea    mild   SUI (stress urinary incontinence, female)     Past Surgical History:  Procedure Laterality Date   BLADDER SUSPENSION N/A 02/13/2013   Procedure: [facility];  Surgeon: Malka So, MD;  Location: [facility];  Service: Urology;  Laterality: N/A;   CESAREAN SECTION  12-23-2004    W/ LEFT TUBAL LIGATION AND RIGHT SALPINGECTOMY   CESAREAN SECTION  1995   CYSTOSCOPY WITH STENT PLACEMENT Bilateral 06/16/2020   Procedure: CYSTOSCOPY WITH FIREFLY INJECTION;  Surgeon: Lucas Mallow, MD;  Location: [facility];  Service: Urology;  Laterality: Bilateral;   IR RADIOLOGIST EVAL & MGMT  04/21/2020   IR RADIOLOGIST EVAL & MGMT  05/12/2020   ORIF HUMERUS FRACTURE Right 04/28/2014   Procedure: OPEN REDUCTION INTERNAL FIXATION (ORIF) PROXIMAL HUMERUS FRACTURE;  Surgeon: Nita Sells, MD;  Location: [place] [facility];  Service: Orthopedics;  Laterality: Right;   ORIF PROXIMAL HUMERUS FRACTURE Right 04/28/2014   DR  CHANDLER   PROCTOSCOPY N/A 06/16/2020   Procedure: RIGID PROCTOSCOPY;  Surgeon: Michael Boston, MD;  Location: [facility];  Service: General;  Laterality: N/A;   TUBAL LIGATION     Family History:  Family History  Adopted: Yes   Family Psychiatric History: See H & P Social History:  Social History   Substance and Sexual Activity  Alcohol Use Yes   Alcohol/week: 2.0 standard drinks of alcohol   Types: 2 Glasses of wine per week   Comment: 12/29/20 restarted      Social History   Substance and Sexual Activity  Drug Use No    Social History   Socioeconomic History   Marital status: Married    Spouse name: Not on file   Number of children: Not on file   Years of education: Not on file   Highest education level: Not on file  Occupational History   Not on file  Tobacco Use   Smoking status: Former    Packs/day: 0.25    Years: 20.00    Total pack years: 5.00    Types: Cigarettes    Quit date: 09/24/2019    Years since quitting: 3.2   Smokeless tobacco: Never  Vaping Use   Vaping Use: Every day  Substance and Sexual Activity   Alcohol use: Yes    Alcohol/week: 2.0 standard drinks of alcohol    Types: 2 Glasses of wine per week    Comment: 12/29/20 restarted    Drug use: No   Sexual activity: Not Currently    Birth control/protection: Post-menopausal  Other Topics Concern   Not on file  Social History Narrative   Not on file   Social Determinants of Health   Financial Resource Strain: Not on file  Food Insecurity: Food Insecurity Present (12/25/2022)   Hunger Vital Sign    Worried About Running Out of Food in the Last Year: Sometimes true    Ran Out of Food in the Last Year: Sometimes true  Transportation Needs: No Transportation Needs (12/25/2022)   PRAPARE - Hydrologist (Medical): No    Lack of Transportation (Non-Medical): No  Physical Activity: Not on file  Stress: Not on file  Social Connections: Not on file   Sleep:  Good  Appetite:  Good  Current Medications: Current Facility-Administered Medications  Medication Dose Route Frequency Provider Last Rate Last Admin   acamprosate (CAMPRAL) tablet 666 mg  666 mg Oral TID WC Massengill, Nathan, MD   666 mg at 12/29/22 0623   albuterol (VENTOLIN HFA) 108 (90 Base) MCG/ACT inhaler 2 puff  2 puff Inhalation PRN Revonda Humphrey, NP       alum & mag hydroxide-simeth (MAALOX/MYLANTA) 200-200-20 MG/5ML suspension 30 mL  30 mL Oral Q4H PRN Revonda Humphrey, NP       escitalopram (LEXAPRO) tablet 10 mg  10 mg Oral Daily Nicholes Rough, NP   10 mg at 12/29/22 2725   gabapentin (NEURONTIN) capsule 200 mg  200 mg Oral Q8H Massengill, Ovid Curd, MD   200 mg at 12/29/22 3664   hydrOXYzine (ATARAX) tablet 25 mg  25 mg Oral Q6H PRN Janine Limbo, MD       ibuprofen (ADVIL)  tablet 400 mg  400 mg Oral Q6H PRN Leevy-Johnson, Kati Riggenbach A, NP       lamoTRIgine (LAMICTAL) tablet 25 mg  25 mg Oral Daily Thomes Lolling H, NP   25 mg at 12/29/22 9518   magnesium hydroxide (MILK OF MAGNESIA) suspension 30 mL  30 mL Oral Daily PRN Revonda Humphrey, NP       mometasone-formoterol (DULERA) 200-5 MCG/ACT inhaler 2 puff  2 puff Inhalation BID Janine Limbo, MD   2 puff at 12/29/22 0813   multivitamin with minerals tablet 1 tablet  1 tablet Oral Daily Revonda Humphrey, NP   1 tablet at 12/29/22 8416   nicotine (NICODERM CQ - dosed in mg/24 hr) patch 7 mg  7 mg Transdermal Daily Massengill, Ovid Curd, MD   7 mg at 12/29/22 6063   nicotine polacrilex (NICORETTE) gum 2 mg  2 mg Oral PRN Massengill, Ovid Curd, MD       progesterone (PROMETRIUM) capsule 200 mg  200 mg Oral Daily Thomes Lolling H, NP   200 mg at 12/29/22 0160   propranolol (INDERAL) tablet 10 mg  10 mg Oral Q8H Massengill, Ovid Curd, MD   10 mg at 12/29/22 1093   SUMAtriptan (IMITREX) tablet 25 mg  25 mg Oral Q2H PRN Massengill, Ovid Curd, MD   25 mg at 12/29/22 1003   thiamine (Vitamin B-1) tablet 100 mg  100 mg Oral Daily  Revonda Humphrey, NP   100 mg at 12/29/22 2355   traZODone (DESYREL) tablet 50 mg  50 mg Oral QHS PRN Nicholes Rough, NP   50 mg at 12/28/22 2042    Lab Results:  No results found for this or any previous visit (from the past 50 hour(s)).   Blood Alcohol level:  Lab Results  Component Value Date   ETH 188 (H) 12/25/2022   ETH 168 (H) 73/22/0254    Metabolic Disorder Labs: Lab Results  Component Value Date   HGBA1C 5.2 12/25/2022   MPG 102.54 12/25/2022   MPG 114.02 01/06/2021   No results found for: "PROLACTIN" Lab Results  Component Value Date   CHOL 223 (H) 12/25/2022   TRIG 57 12/25/2022   HDL 128 12/25/2022   CHOLHDL 1.7 12/25/2022   VLDL 11 12/25/2022   LDLCALC 84 12/25/2022   LDLCALC 82 01/06/2021    Physical Findings: AIMS: Facial and Oral Movements Muscles of Facial Expression: None, normal Lips and Perioral Area: None, normal Jaw: None, normal Tongue: None, normal,Extremity Movements Upper (arms, wrists, hands, fingers): None, normal Lower (legs, knees, ankles, toes): None, normal, Trunk Movements Neck, shoulders, hips: None, normal, Overall Severity Severity of abnormal movements (highest score from questions above): None, normal Incapacitation due to abnormal movements: None, normal Patient's awareness of abnormal movements (rate only patient's report): No Awareness, Dental Status Current problems with teeth and/or dentures?: No Does patient usually wear dentures?: No  CIWA:  CIWA-Ar Total: 2 COWS:     Musculoskeletal: Strength & Muscle Tone: within normal limits Gait & Station: normal Patient leans: N/A  Psychiatric Specialty Exam:  Presentation  General Appearance:  Casual  Eye Contact: Fair  Speech: Clear and Coherent  Speech Volume: Normal  Handedness: Right   Mood and Affect  Mood: Euthymic  Affect: Appropriate; Congruent   Thought Process  Thought Processes: Coherent; Goal Directed  Descriptions of  Associations:Intact  Orientation:Full (Time, Place and Person)  Thought Content:Logical  History of Schizophrenia/Schizoaffective disorder:No  Duration of Psychotic Symptoms:No data recorded Hallucinations:Hallucinations: None   Ideas of Reference:None  Suicidal Thoughts:Suicidal Thoughts: No  Homicidal Thoughts:Homicidal Thoughts: No   Sensorium  Memory: Immediate Good; Recent Good  Judgment: Fair  Insight: Fair   Materials engineer: Fair  Attention Span: Fair  Recall: [place] of Knowledge: Fair  Language: Fair  Psychomotor Activity  Psychomotor Activity: Psychomotor Activity: Normal  Assets  Assets: Armed forces logistics/support/administrative officer; Resilience; Social Support  Sleep  Sleep: Sleep: Good  Physical Exam: Physical Exam Constitutional:      Appearance: Normal appearance.  HENT:     Head: Normocephalic.  Eyes:     Pupils: Pupils are equal, round, and reactive to light.  Musculoskeletal:     Cervical back: Normal range of motion.  Neurological:     Mental Status: She is alert and oriented to person, place, and time.  Psychiatric:        Attention and Perception: Attention and perception normal.        Mood and Affect: Mood and affect normal.        Speech: Speech normal.        Behavior: Behavior normal. Behavior is cooperative.        Thought Content: Thought content normal. Thought content is not paranoid or delusional. Thought content does not include homicidal or suicidal ideation. Thought content does not include homicidal or suicidal plan.        Cognition and Memory: Cognition and memory normal.        Judgment: Judgment normal.    Review of Systems  Constitutional: Negative.   HENT: Negative.    Eyes: Negative.   Respiratory: Negative.    Cardiovascular: Negative.   Gastrointestinal: Negative.   Genitourinary: Negative.   Musculoskeletal: Negative.   Skin: Negative.   Neurological: Negative.   Psychiatric/Behavioral:   Positive for substance abuse. Negative for hallucinations, memory loss and suicidal ideas.   All other systems reviewed and are negative.  Blood pressure 109/75, pulse 83, temperature 98 F (36.7 C), temperature source Oral, resp. rate 16, height '5\' 5"'$  (1.651 m), weight 60.8 kg, SpO2 100 %. Body mass index is 22.3 kg/m.  Treatment Plan Summary: Daily contact with patient to assess and evaluate symptoms and progress in treatment and Medication management   Observation Level/Precautions:  15 minute checks  Laboratory:  Labs reviewed   Psychotherapy:  Unit Group sessions  Medications:  See [facility]  Consultations:  To be determined   Discharge Concerns:  Safety, medication compliance, mood stability  Estimated LOS: 5-7 days  Other:  N/A    Labs reviewed on 12/26/2022: UA with trace leukocytes, few bacteria, hazy in appearance, will repeat.  TSH WNL, lipid panel with cholesterol 223 otherwise WNL, BAL on admission 188, hemoglobin A1c WNL, respiratory panel WNL, CMP WNL, CBC WNL.  EKG with QTc of 435.   PLAN Safety and Monitoring: Voluntary admission to inpatient psychiatric unit for safety, stabilization and treatment Daily contact with patient to assess and evaluate symptoms and progress in treatment Patient's case to be discussed in multi-disciplinary team meeting Observation Level : q15 minute checks Vital signs: q12 hours Precautions: Safety   Long Term Goal(s): Improvement in symptoms so as ready for discharge   Short Term Goals: Ability to identify changes in lifestyle to reduce recurrence of condition will improve, Ability to disclose and discuss suicidal ideas, Ability to demonstrate self-control will improve, Ability to identify and develop effective coping behaviors will improve, Compliance with prescribed medications will improve, and Ability to identify triggers associated with substance abuse/mental health issues will improve  Diagnoses:  Principal Problem:   MDD (major  depressive disorder), recurrent severe, without psychosis ([place]) Active Problems:   GAD (generalized anxiety disorder)   Insomnia   MDD (major depressive disorder), recurrent episode, severe ([facility])   Alcohol use disorder   Asthma   Medications -Continue:  - Campral 666 mg for alcohol use d/o (-D'cd Prozac on admission  due to lack of effectiveness) - Lexapro 10 mg for depressive  symptoms (patient states medication was effective in the past for management of depressive symptoms) - Ativan detox protocol as per  MAR-please see MAR for complete order - Lamictal 25 mg daily for mood  stabilization (home medication) - Gabapentin: Started at 200 mg  every 8 hours for GAD (home medication) - Inderal 10 mg every 8 hours for  tachycardia and anxiety - Progesterone 200 mg daily (home  medication)   Other PRNs:  - Start:   - Ibuprofen 400 mg q4 hrs PRN for  moderate pain - Continue:  - Imitrex PRN for migraines - Tylenol 650 mg every 6 hours PRN  for mild pain - Maalox 30 mg every 4 hrs PRN for  indigestion - Imodium 2-4 mg as needed for  diarrhea - Milk of Magnesia as needed every  6 hrs for constipation - Zofran disintegrating tabs every 6  hrs PRN for nausea  - Albuterol inhaler as needed for  wheezing and shortness of  breath - Trazodone 50 mg nightly as  needed for anxiety -Discontinue:   - Acetaminophen 650 mg q6 hrs prn  mild pain   Discharge Planning: Social work and case management to assist with discharge planning and identification of hospital follow-up needs prior to discharge Estimated LOS: 5-7 days Discharge Concerns: Need to establish a safety plan; Medication compliance and effectiveness Discharge Goals: Return home with outpatient referrals for mental health follow-up including medication management/psychotherapy   I certify that inpatient services furnished can reasonably be expected to improve the patient's condition.    Inda Merlin,  NP 12/29/2022, 3:50 PM  Patient ID: KYNDELL ZEISER, female   DOB: [birth year], 57 y.o.   MRN: [id number]

## 2022-12-29 NOTE — Progress Notes (Signed)
Patient alert and cooperative with care, patient denies SI,HI, and AVH, denies depression and anxiety. Patient requested for sleeping medication, PRN Trazodone administered per MAR. Q 15 minutes observations maintained.   12/28/22 2045  Psych Admission Type (Psych Patients Only)  Admission Status Voluntary  Psychosocial Assessment  Patient Complaints None  Eye Contact Fair  Facial Expression Anxious  Affect Depressed  Speech Logical/coherent;Soft  Interaction Assertive  Motor Activity Other (Comment)  Appearance/Hygiene Unremarkable  Behavior Characteristics Cooperative;Appropriate to situation  Mood Depressed  Thought Process  Coherency WDL  Content WDL  Delusions None reported or observed  Perception WDL  Hallucination None reported or observed  Judgment Impaired  Confusion None  Danger to Self  Current suicidal ideation? Denies  Self-Injurious Behavior No self-injurious ideation or behavior indicators observed or expressed   Danger to Others  Danger to Others None reported or observed

## 2022-12-29 NOTE — Group Note (Signed)
Recreation Therapy Group Note   Group Topic:Team Building  Group Date: 12/29/2022 Start Time: 0935 End Time: 1020 Facilitators: Alfonso Shackett-McCall, LRT,CTRS Location: [address] Dayroom   Goal Area(s) Addresses:  Patient will effectively work with peer towards shared goal.  Patient will identify skills used to make activity successful.  Patient will identify how skills used during activity can be used to reach post d/c goals.   Group Description: Straw Bridge. In teams of 3-5, patients were given 15 plastic drinking straws and an equal length of masking tape. Using the materials provided, patients were instructed to build a free standing bridge-like structure to suspend an everyday item (ex: puzzle box) off of the floor or table surface. All materials were required to be used by the team in their design. LRT facilitated post-activity discussion reviewing team process. Patients were encouraged to reflect how the skills used in this activity can be generalized to daily life post discharge.   Affect/Mood: Appropriate   Participation Level: Engaged   Participation Quality: Independent   Behavior: Appropriate   Speech/Thought Process: Focused   Insight: Good   Judgement: Good   Modes of Intervention: STEM Activity   Patient Response to Interventions:  Engaged   Education Outcome:  Acknowledges education and In group clarification offered    Clinical Observations/Individualized Feedback: Pt attended and participated in group session.     Plan: Continue to engage patient in RT group sessions 2-3x/week.   Lexiana Spindel-McCall, LRT,CTRS  12/29/2022 12:54 PM

## 2022-12-29 NOTE — Group Note (Signed)
Date:  12/29/2022 Time:  10:12 AM  Group Topic/Focus:  Orientation:   The focus of this group is to educate the patient on the purpose and policies of crisis stabilization and provide a format to answer questions about their admission.  The group details unit policies and expectations of patients while admitted.    Participation Level:  Active  Participation Quality:  Appropriate  Affect:  Appropriate  Cognitive:  Appropriate  Insight: Appropriate  Engagement in Group:  Engaged  Modes of Intervention:  Discussion  Additional Comments:     Jerrye Beavers 12/29/2022, 10:12 AM

## 2022-12-29 NOTE — Progress Notes (Signed)
   12/29/22 7618  Psych Admission Type (Psych Patients Only)  Admission Status Voluntary  Psychosocial Assessment  Patient Complaints None  Eye Contact Fair  Facial Expression Anxious  Affect Depressed  Speech Logical/coherent  Interaction Assertive  Motor Activity Other (Comment) ([facility])  Appearance/Hygiene Unremarkable  Behavior Characteristics Appropriate to situation  Mood Depressed  Thought Process  Coherency WDL  Content WDL  Delusions None reported or observed  Perception WDL  Hallucination None reported or observed  Judgment Impaired  Confusion None  Danger to Self  Current suicidal ideation? Denies  Self-Injurious Behavior No self-injurious ideation or behavior indicators observed or expressed   Agreement Not to Harm Self Yes  Description of Agreement Verbal  Danger to Others  Danger to Others None reported or observed

## 2022-12-30 DIAGNOSIS — F332 Major depressive disorder, recurrent severe without psychotic features: Secondary | ICD-10-CM | POA: Diagnosis not present

## 2022-12-30 MED ORDER — TRAZODONE HCL 50 MG PO TABS: 50.0000 mg | ORAL_TABLET | Freq: Every evening | ORAL | 0 refills | Status: DC | PRN

## 2022-12-30 MED ORDER — PROPRANOLOL HCL 10 MG PO TABS: 10.0000 mg | ORAL_TABLET | Freq: Three times a day (TID) | ORAL | 0 refills | Status: DC

## 2022-12-30 MED ORDER — NICOTINE 7 MG/24HR TD PT24: 7.0000 mg | MEDICATED_PATCH | Freq: Every day | TRANSDERMAL | 0 refills | Status: DC

## 2022-12-30 MED ORDER — GABAPENTIN 100 MG PO CAPS: 200.0000 mg | ORAL_CAPSULE | Freq: Three times a day (TID) | ORAL | 0 refills | Status: DC

## 2022-12-30 MED ORDER — ACAMPROSATE CALCIUM 333 MG PO TBEC: 666.0000 mg | DELAYED_RELEASE_TABLET | Freq: Three times a day (TID) | ORAL | 0 refills | Status: DC

## 2022-12-30 MED ORDER — VITAMIN B-1 100 MG PO TABS: 100.0000 mg | ORAL_TABLET | Freq: Every day | ORAL | 0 refills | Status: DC

## 2022-12-30 MED ORDER — LAMOTRIGINE 25 MG PO TABS: 25.0000 mg | ORAL_TABLET | Freq: Every day | ORAL | 0 refills | Status: DC

## 2022-12-30 MED ORDER — SUMATRIPTAN SUCCINATE 25 MG PO TABS: 25.0000 mg | ORAL_TABLET | ORAL | 0 refills | Status: DC | PRN

## 2022-12-30 MED ORDER — HYDROXYZINE HCL 25 MG PO TABS: 25.0000 mg | ORAL_TABLET | Freq: Four times a day (QID) | ORAL | 0 refills | Status: DC | PRN

## 2022-12-30 MED ORDER — ESCITALOPRAM OXALATE 10 MG PO TABS: 10.0000 mg | ORAL_TABLET | Freq: Every day | ORAL | 0 refills | Status: DC

## 2022-12-30 NOTE — Progress Notes (Signed)
  [facility] Adult Case Management Discharge Plan :  Will you be returning to the same living situation after discharge:  Yes,  lives alone but her friend is going to stay with her for awhile At discharge, do you have transportation home?: Yes,  friend Do you have the ability to pay for your medications: No.  Referred to community organization  Release of information consent forms completed and emailed to Medical Records, then turned in to Medical Records by CSW.   Patient to Follow up at:  Follow-up Information     [facility].   Why: You may call this provider to schedule an appointment for therapy services if you have proof of insurance. Contact information: [place] [address] [phone]         [facility] Follow up.   Why: You may go to this provider for an assessment, to obtain group therapy, and medication management services on Monday through Friday, from 9 am to 3 pm.  You can also ask about their [place] location, if that would be closer. Contact information: [address] [place] [address] [phone]         [facility]. Go on 01/02/2023.   Specialty: Behavioral Health Why: Please go to this provider for an assessment, to obtain group therapy, and medication management services on Monday through Friday; arrive by 7:20 am.  Assessments are provided on a first come, first served basis. Contact information: [place]. Call.   Why: You may call this provider to schedule an appointment for substance abuse intensive outpatient therapy services. Contact information: [address], [place], [place] [address]  Phone: [phone]                Next level of care provider has access to [place] and Suicide Prevention discussed: Yes,  mother     Has patient been  referred to the Quitline?: N/A patient is not a smoker  Patient has been referred for addiction treatment: Yes  Maretta Los, LCSW 12/30/2022, 10:26 AM

## 2022-12-30 NOTE — BHH Group Notes (Signed)
Group Focus: affirmation, clarity of thought, and goals/reality orientation Treatment Modality:  Psychoeducation Interventions utilized were assignment, group exercise, and support Purpose: To be able to understand and verbalize the reason for their admission to the hospital. To understand that the medication helps with their chemical imbalance but they also need to work on their choices in life. To be challenged to develop a list of 30 positives about themselves. Also introduce the concept that "feelings" are not reality.  Participation Level:  Active  Participation Quality:  Appropriate  Affect:  Appropriate  Cognitive:  Appropriate  Insight:  Improving  Engagement in Group:  Engaged  Additional Comments: Rates her energy at a 10/10. Participated in the group.  [name]

## 2022-12-30 NOTE — Plan of Care (Signed)
  Problem: Education: Goal: Knowledge of disease or condition will improve Outcome: Progressing   Problem: Health Behavior/Discharge Planning: Goal: Identification of resources available to assist in meeting health care needs will improve Outcome: Progressing   Problem: Safety: Goal: Ability to remain free from injury will improve Outcome: Progressing

## 2022-12-30 NOTE — Discharge Summary (Signed)
Physician Discharge Summary Note  Patient:  [name]  MRN:  [id number] DOB:  September 01, 1966 Patient phone:  [phone] (home)  Patient address:   [address] [place] [address],  Total Time spent with patient: 30 minutes  Date of Admission:  12/25/2022 Date of Discharge: 12/30/2022  HPI: [name] is a 57 yo Caucasian female with prior mental health history of MDD, ADHD, GAD & alcohol use d/o who presented to the [employer] health urgent care [facility]) with complaints of worsening depressive symptoms with suicidal ideations with a plan to overdose on her medications in the context of financial stressors and alcohol abuse. Pt was transferred voluntarily [facility] for treatment and stabilization of her mood.     24-hour chart review: Vital signs remain within normal limits; 116/76, PR 82. Pt medication compliant; PRN Albuterol given during the morning for wheezing, Ibuprofen 400 mg 0623, Trazodone 50 mg last night for insomnia. Patient continues to attend unit group activities and remains present in the milieu. Reports sleeping overnight without any issues. No behavioral concerns noted in the last 24 hours.   Assessment: Patient presents sitting in bed reading. Alert and oriented. Casual appearance. Euthymic mood; affect brightens on approach. Calm and cooperative. Good eye contact. Logical and linear thoughts. Continues to report feeling 'Much better than when I got here. I went to classes this morning and they've been very helpful'. She reports improved energy levels stating she has been able to 'get out of bed, bath, and actually make my bed. Before coming in I just couldn't get out of bed'. Denies any safety concerns regarding discharge today and has active safety plan in place including having a friend come stay for a few days and plan to go to moms for a little bit afterwards. (mom lives across the street.) She states continued interest in possibly going to [facility],  or treatment center in [place] for long-term substance abuse treatment. She denies any SI/HI/AVH, cravings, withdrawal symptoms, or any other medication related side effects other than the tiredness and dizziness. Current medication regimen continued as listed below. Patient is currently covered by COBRA in which she states concern with affording in the future. Plan to follow up with government sponsored insurance plans. Pharmacy to provide samples; written prescriptions given as well.   Principal Problem: MDD (major depressive disorder), recurrent severe, without psychosis ([place]) Discharge Diagnoses: Principal Problem:   MDD (major depressive disorder), recurrent severe, without psychosis (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   History of anemia    Severe episode of recurrent major depressive disorder, without psychotic features ([place]) 01/07/2020   Sleep apnea    mild   SUI (stress urinary incontinence, female)     Past Surgical History:  Procedure Laterality Date   BLADDER SUSPENSION N/A 02/13/2013   Procedure: [facility] SLING;  Surgeon:  Malka So, MD;  Location: [facility];  Service: Urology;  Laterality: N/A;   CESAREAN SECTION  12-23-2004    W/ LEFT TUBAL LIGATION AND RIGHT SALPINGECTOMY   CESAREAN SECTION  1995   CYSTOSCOPY WITH STENT PLACEMENT Bilateral 06/16/2020   Procedure: CYSTOSCOPY  WITH FIREFLY INJECTION;  Surgeon: Lucas Mallow, MD;  Location: [facility];  Service: Urology;  Laterality: Bilateral;   IR RADIOLOGIST EVAL & MGMT  04/21/2020   IR RADIOLOGIST EVAL & MGMT  05/12/2020   ORIF HUMERUS FRACTURE Right 04/28/2014   Procedure: OPEN REDUCTION INTERNAL FIXATION (ORIF) PROXIMAL HUMERUS FRACTURE;  Surgeon: Nita Sells, MD;  Location: [facility];  Service: Orthopedics;  Laterality: Right;   ORIF PROXIMAL HUMERUS FRACTURE Right 04/28/2014   DR CHANDLER   PROCTOSCOPY N/A 06/16/2020   Procedure: RIGID PROCTOSCOPY;  Surgeon: Michael Boston, MD;  Location: [facility];  Service: General;  Laterality: N/A;   TUBAL LIGATION     Family History:  Family History  Adopted: Yes   Family Psychiatric History: (m) grandfather: alcoholism; (p) grandmother: anxiety Social History:  Social History   Substance and Sexual Activity  Alcohol Use Yes   Alcohol/week: 2.0 standard drinks of alcohol   Types: 2 Glasses of wine per week   Comment: 12/29/20 restarted      Social History   Substance and Sexual Activity  Drug Use No    Social History   Socioeconomic History   Marital status: Married    Spouse name: Not on file   Number of children: Not on file   Years of education: Not on file   Highest education level: Not on file  Occupational History   Not on file  Tobacco Use   Smoking status: Former    Packs/day: 0.25    Years: 20.00    Total pack years: 5.00    Types: Cigarettes    Quit date: 09/24/2019    Years since quitting: 3.2   Smokeless tobacco: Never  Vaping Use   Vaping Use: Every day  Substance and Sexual Activity   Alcohol use: Yes    Alcohol/week: 2.0 standard drinks of alcohol    Types: 2 Glasses of wine per week    Comment: 12/29/20 restarted    Drug use: No   Sexual activity: Not Currently    Birth control/protection: Post-menopausal  Other Topics Concern   Not on file  Social History Narrative   Not on file   Social Determinants of Health    Financial Resource Strain: Not on file  Food Insecurity: Food Insecurity Present (12/25/2022)   Hunger Vital Sign    Worried About Running Out of Food in the Last Year: Sometimes true    Ran Out of Food in the Last Year: Sometimes true  Transportation Needs: No Transportation Needs (12/25/2022)   PRAPARE - Hydrologist (Medical): No    Lack of Transportation (Non-Medical): No  Physical Activity: Not on file  Stress: Not on file  Social Connections: Not on file   Hospital Course:  HOSPITAL COURSE:  During the patient's hospitalization, patient had extensive initial psychiatric evaluation, and follow-up psychiatric evaluations every day.  Psychiatric diagnoses provided upon initial assessment: MDD, recurrent episode, severe, alcohol use disorder, GAD  Patient's psychiatric medications were adjusted on admission: see below  During the hospitalization, other adjustments were made to the patient's psychiatric medication regimen: see below  Patient's care was discussed during the interdisciplinary team meeting every day  during the hospitalization.  The patient denies having side effects to prescribed psychiatric medication.  Gradually, patient started adjusting to milieu. The patient was evaluated each day by a clinical provider to ascertain response to treatment. Improvement was noted by the patient's report of decreasing symptoms, improved sleep and appetite, affect, medication tolerance, behavior, and participation in unit programming.  Patient was asked each day to complete a self inventory noting mood, mental status, pain, new symptoms, anxiety and concerns.    Symptoms were reported as significantly decreased or resolved completely by discharge.   On day of discharge, the patient reports that their mood is stable. The patient denied having suicidal thoughts for more than 48 hours prior to discharge.  Patient denies having homicidal thoughts.  Patient  denies having auditory hallucinations.  Patient denies any visual hallucinations or other symptoms of psychosis. The patient was motivated to continue taking medication with a goal of continued improvement in mental health.   The patient reports their target psychiatric symptoms of depression responded well to the psychiatric medications, and the patient reports overall benefit other psychiatric hospitalization. Supportive psychotherapy was provided to the patient. The patient also participated in regular group therapy while hospitalized. Coping skills, problem solving as well as relaxation therapies were also part of the unit programming.  Labs were reviewed with the patient, and abnormal results were discussed with the patient.  The patient is able to verbalize their individual safety plan to this provider.  # It is recommended to the patient to continue psychiatric medications as prescribed, after discharge from the hospital.    # It is recommended to the patient to follow up with your outpatient psychiatric provider and PCP.  # It was discussed with the patient, the impact of alcohol, drugs, tobacco have been there overall psychiatric and medical wellbeing, and total abstinence from substance use was recommended the patient.ed.  # Prescriptions provided or sent directly to preferred pharmacy at discharge. Patient agreeable to plan. Given opportunity to ask questions. Appears to feel comfortable with discharge.    # In the event of worsening symptoms, the patient is instructed to call the crisis hotline, 911 and or go to the nearest ED for appropriate evaluation and treatment of symptoms. To follow-up with primary care provider for other medical issues, concerns and or health care needs  # Patient was discharged home with a plan to follow up as noted below.   Physical Findings: AIMS: Facial and Oral Movements Muscles of Facial Expression: None, normal Lips and Perioral Area: None,  normal Jaw: None, normal Tongue: None, normal,Extremity Movements Upper (arms, wrists, hands, fingers): None, normal Lower (legs, knees, ankles, toes): None, normal, Trunk Movements Neck, shoulders, hips: None, normal, Overall Severity Severity of abnormal movements (highest score from questions above): None, normal Incapacitation due to abnormal movements: None, normal Patient's awareness of abnormal movements (rate only patient's report): No Awareness, Dental Status Current problems with teeth and/or dentures?: No Does patient usually wear dentures?: No  CIWA:  CIWA-Ar Total: 0 COWS:     Musculoskeletal: Strength & Muscle Tone: within normal limits Gait & Station: normal Patient leans: N/A   Psychiatric Specialty Exam:  Presentation  General Appearance:  Casual  Eye Contact: Fair  Speech: Clear and Coherent  Speech Volume: Normal  Handedness: Right   Mood and Affect  Mood: Euthymic  Affect: Appropriate; Congruent  Thought Process  Thought Processes: Coherent; Goal Directed  Descriptions of Associations:Intact  Orientation:Full (Time, Place and Person)  Thought Content:Logical  History  of Schizophrenia/Schizoaffective disorder:No  Duration of Psychotic Symptoms:No data recorded Hallucinations:Hallucinations: None  Ideas of Reference:None  Suicidal Thoughts:Suicidal Thoughts: No  Homicidal Thoughts:Homicidal Thoughts: No  Sensorium  Memory: Immediate Fair; Recent Fair  Judgment: Fair  Insight: Fair  Community education officer  Concentration: Fair  Attention Span: Fair  Recall: [employer] of Knowledge: Fair  Language: Fair  Psychomotor Activity  Psychomotor Activity: Psychomotor Activity: Normal  Assets  Assets: Resilience; Social Support; Armed forces logistics/support/administrative officer; Housing; Catering manager; Desire for Improvement  Sleep  Sleep: Sleep: Good  Physical Exam: Physical Exam Vitals and nursing note reviewed.   Constitutional:      Appearance: She is normal weight. She is not ill-appearing.  HENT:     Head: Normocephalic.     Nose: Nose normal.     Mouth/Throat:     Mouth: Mucous membranes are moist.     Pharynx: Oropharynx is clear.  Eyes:     Pupils: Pupils are equal, round, and reactive to light.  Cardiovascular:     Rate and Rhythm: Normal rate.     Pulses: Normal pulses.  Pulmonary:     Effort: Pulmonary effort is normal.  Abdominal:     Palpations: Abdomen is soft.  Musculoskeletal:        General: Normal range of motion.     Cervical back: Normal range of motion.  Skin:    General: Skin is warm and dry.  Neurological:     Mental Status: She is oriented to person, place, and time.  Psychiatric:        Attention and Perception: Attention and perception normal. She does not perceive auditory or visual hallucinations.        Mood and Affect: Mood and affect normal.        Speech: Speech normal.        Behavior: Behavior normal. Behavior is cooperative.        Thought Content: Thought content normal. Thought content is not paranoid or delusional. Thought content does not include homicidal or suicidal ideation. Thought content does not include homicidal or suicidal plan.        Cognition and Memory: Cognition and memory normal.        Judgment: Judgment normal.    Review of Systems  Constitutional:  Negative for diaphoresis, malaise/fatigue and weight loss.  Psychiatric/Behavioral:  Positive for substance abuse. Negative for depression and suicidal ideas.   All other systems reviewed and are negative.  Blood pressure 116/76, pulse 82, temperature 97.9 F (36.6 C), temperature source Oral, resp. rate 16, height '5\' 5"'$  (1.651 m), weight 60.8 kg, SpO2 99 %. Body mass index is 22.3 kg/m.   Social History   Tobacco Use  Smoking Status Former   Packs/day: 0.25   Years: 20.00   Total pack years: 5.00   Types: Cigarettes   Quit date: 09/24/2019   Years since quitting: 3.2   Smokeless Tobacco Never   Tobacco Cessation:  A prescription for an FDA-approved tobacco cessation medication provided at discharge   Blood Alcohol level:  Lab Results  Component Value Date   ETH 188 (H) 12/25/2022   ETH 168 (H) 00/93/8182    Metabolic Disorder Labs:  Lab Results  Component Value Date   HGBA1C 5.2 12/25/2022   MPG 102.54 12/25/2022   MPG 114.02 01/06/2021   No results found for: "PROLACTIN" Lab Results  Component Value Date   CHOL 223 (H) 12/25/2022   TRIG 57 12/25/2022   HDL 128 12/25/2022   CHOLHDL  1.7 12/25/2022   VLDL 11 12/25/2022   LDLCALC 84 12/25/2022   LDLCALC 82 01/06/2021    See Psychiatric Specialty Exam and Suicide Risk Assessment completed by Attending Physician prior to discharge.  Discharge destination:  Home  Is patient on multiple antipsychotic therapies at discharge:  No   Has Patient had three or more failed trials of antipsychotic monotherapy by history:  No  Recommended Plan for Multiple Antipsychotic Therapies: NA  Discharge Instructions     Diet - low sodium heart healthy   Complete by: As directed    Increase activity slowly   Complete by: As directed       Allergies as of 12/30/2022       Reactions   Codeine Nausea And Vomiting, Other (See Comments)   dizziness   Hydrocodone Other (See Comments)   Dizzy; Tolerates oxycodone fine        Medication List     STOP taking these medications    busPIRone 7.5 MG tablet Commonly known as: BUSPAR   gabapentin 600 MG tablet Commonly known as: NEURONTIN Replaced by: gabapentin 100 MG capsule   hydrOXYzine 50 MG capsule Commonly known as: VISTARIL   ProAir HFA 108 (90 Base) MCG/ACT inhaler Generic drug: albuterol   progesterone 200 MG capsule Commonly known as: PROMETRIUM       TAKE these medications      Indication  acamprosate 333 MG tablet Commonly known as: CAMPRAL Take 2 tablets (666 mg total) by mouth 3 (three) times daily with meals.   Indication: Abuse or Misuse of Alcohol   escitalopram 10 MG tablet Commonly known as: LEXAPRO Take 1 tablet (10 mg total) by mouth daily. Start taking on: December 31, 2022  Indication: Major Depressive Disorder   gabapentin 100 MG capsule Commonly known as: NEURONTIN Take 2 capsules (200 mg total) by mouth every 8 (eight) hours. Replaces: gabapentin 600 MG tablet  Indication: other   hydrOXYzine 25 MG tablet Commonly known as: ATARAX Take 1 tablet (25 mg total) by mouth every 6 (six) hours as needed (anxiety/agitation or CIWA < or = 10).  Indication: Feeling Anxious   lamoTRIgine 25 MG tablet Commonly known as: LAMICTAL Take 1 tablet (25 mg total) by mouth daily. Start taking on: December 31, 2022 What changed:  medication strength how much to take when to take this  Indication: Depressive Phase of Manic-Depression   nicotine 7 mg/24hr patch Commonly known as: NICODERM CQ - dosed in mg/24 hr Place 1 patch (7 mg total) onto the skin daily. Start taking on: December 31, 2022  Indication: Nicotine Addiction   propranolol 10 MG tablet Commonly known as: INDERAL Take 1 tablet (10 mg total) by mouth every 8 (eight) hours.  Indication: Feeling Anxious   SUMAtriptan 25 MG tablet Commonly known as: IMITREX Take 1 tablet (25 mg total) by mouth every 2 (two) hours as needed for migraine or headache. May repeat in 2 hours if headache persists or recurs. What changed:  medication strength how much to take reasons to take this additional instructions  Indication: Migraine Headache   thiamine 100 MG tablet Commonly known as: Vitamin B-1 Take 1 tablet (100 mg total) by mouth daily. Start taking on: December 31, 2022  Indication: Deficiency of Vitamin B1   traZODone 50 MG tablet Commonly known as: DESYREL Take 1 tablet (50 mg total) by mouth at bedtime as needed for sleep.  Indication: Trouble Sleeping        Follow-up Information  [facility] Follow up.   Why:  You may call this provider to schedule an appointment for therapy services if you have proof of insurance. Contact information: [place] [address] [phone]         [facility] Follow up.   Why: You may go to this provider for an assessment, to obtain group therapy, and medication management services on Monday through Friday, from 9 am to 3 pm.  You can also ask about their [place] location, if that would be closer. Contact information: [address] [place] [address] [phone]         [facility]. Go on 01/02/2023.   Specialty: Behavioral Health Why: Please go to this provider for an assessment, to obtain group therapy, and medication management services on Monday through Friday; arrive by 7:20 am.  Assessments are provided on a first come, first served basis. Contact information: [place]. Call.   Why: You may call this provider to schedule an appointment for substance abuse intensive outpatient therapy services. Contact information: [address] [place], [place] [address]  Phone: [phone]                Follow-up recommendations:  Activity:    Activity: As tolerated  Diet: Heart healthy  Other: -Follow-up with your outpatient psychiatric provider -instructions on appointment date, time, and address (location) are provided to you in discharge paperwork.   -Take your psychiatric medications as prescribed at discharge - instructions are provided to you in the discharge paperwork.    -Follow-up with outpatient primary care doctor and other specialists -for management of preventative medicine and chronic medical disease, including:   -Testing: Follow-up with outpatient provider for abnormal lab results:  none   -Recommend abstinence from alcohol, tobacco, and other illicit drug use at discharge.     -If your psychiatric symptoms recur, worsen, or if you have side effects to your psychiatric medications, call your outpatient psychiatric provider, 911, [phone] or go to the nearest emergency department.   -If suicidal thoughts recur, call your outpatient psychiatric provider, 911, [phone] or go to the nearest emergency department.  Comments:    Signed: Inda Merlin, NP 12/30/2022, 10:56 AM

## 2022-12-30 NOTE — Progress Notes (Signed)
Patient discharged from [facility] on 12/30/22 at 11:30am. Patient denies SI, plan, and intention. Suicide safety plan completed, reviewed with this RN, given to the patient, and a copy in the chart. Patient denies HI/AVH upon discharge. Patient rates her depression a 0/10 and her anxiety a 0/10. Patient is alert, oriented, and cooperative. RN provided patient with discharge paperwork and reviewed information with patient. Patient expressed that she understood all of the discharge instructions. Pt was satisfied with belongings returned to her from the locker and at bedside. Discharged patient to [facility] waiting room.

## 2022-12-30 NOTE — [facility] Suicide Risk Assessment (Signed)
Suicide Risk Assessment  Discharge Assessment    [facility] Discharge Suicide Risk Assessment   Principal Problem: MDD (major depressive disorder), recurrent severe, without psychosis ([place]) Discharge Diagnoses: Principal Problem:   MDD (major depressive disorder), recurrent severe, without psychosis ([place]) Active Problems:   GAD (generalized anxiety disorder)   Insomnia   MDD (major depressive disorder), recurrent episode, severe ([place])   Alcohol use disorder   Asthma  HPI: [name] is a 57 yo Caucasian female with prior mental health history of MDD, ADHD, GAD & alcohol use d/o who presented to the [facility] health urgent care [facility]) with complaints of worsening depressive symptoms with suicidal ideations with a plan to overdose on her medications in the context of financial stressors and alcohol abuse. Pt was transferred voluntarily [place] for treatment and stabilization of her mood.     24-hour chart review: Vital signs remain within normal limits; 116/76, PR 82. Pt medication compliant; PRN Albuterol given during the morning for wheezing, Ibuprofen 400 mg 0623, Trazodone 50 mg last night for insomnia. Patient continues to attend unit group activities and remains present in the milieu. Reports sleeping overnight without any issues. No behavioral concerns noted in the last 24 hours.   Assessment: Patient presents sitting in bed reading. Alert and oriented. Casual appearance. Euthymic mood; affect brightens on approach. Calm and cooperative. Good eye contact. Logical and linear thoughts. Continues to report feeling 'Much better than when I got here. I went to classes this morning and they've been very helpful'. She reports improved energy levels stating she has been able to 'get out of bed, bath, and actually make my bed. Before coming in I just couldn't get out of bed'. Denies any safety concerns regarding discharge today and has active safety plan in place including having a  friend come stay for a few days and plan to go to moms for a little bit afterwards. (mom lives across the street.) She states continued interest in possibly going to [facility], or treatment center in [place] for long-term substance abuse treatment. She denies any SI/HI/AVH, cravings, withdrawal symptoms, or any other medication related side effects other than the tiredness and dizziness. Current medication regimen continued as listed below. Patient is currently covered by COBRA in which she states concern with affording in the future. Plan to follow up with government sponsored insurance plans. Pharmacy to provide samples; written prescriptions given as well.   Total Time spent with patient: 30 minutes  Musculoskeletal: Strength & Muscle Tone: within normal limits Gait & Station: normal Patient leans: N/A  Psychiatric Specialty Exam  Presentation  General Appearance:  Casual  Eye Contact: Fair  Speech: Clear and Coherent  Speech Volume: Normal  Handedness: Right  Mood and Affect  Mood: Euthymic  Duration of Depression Symptoms: Greater than two weeks  Affect: Appropriate; Congruent  Thought Process  Thought Processes: Coherent; Goal Directed  Descriptions of Associations:Intact  Orientation:Full (Time, Place and Person)  Thought Content:Logical  History of Schizophrenia/Schizoaffective disorder:No  Duration of Psychotic Symptoms:No data recorded Hallucinations:Hallucinations: None  Ideas of Reference:None  Suicidal Thoughts:Suicidal Thoughts: No  Homicidal Thoughts:Homicidal Thoughts: No  Sensorium  Memory: Immediate Fair; Recent Fair  Judgment: Fair  Insight: Fair  Community education officer  Concentration: Fair  Attention Span: Fair  Recall: [employer] of Knowledge: Fair  Language: Fair  Psychomotor Activity  Psychomotor Activity: Psychomotor Activity: Normal  Assets  Assets: Resilience; Social Support; Armed forces logistics/support/administrative officer;  Housing; Catering manager; Desire for Improvement  Sleep  Sleep: Sleep: Good  Physical Exam: Physical Exam Vitals and nursing note reviewed.  Constitutional:      General: She is not in acute distress.    Appearance: She is normal weight. She is not ill-appearing or toxic-appearing.  HENT:     Head: Normocephalic.     Nose: Nose normal.     Mouth/Throat:     Mouth: Mucous membranes are moist.     Pharynx: Oropharynx is clear.  Eyes:     Pupils: Pupils are equal, round, and reactive to light.  Cardiovascular:     Rate and Rhythm: Normal rate.     Pulses: Normal pulses.  Pulmonary:     Effort: Pulmonary effort is normal.  Abdominal:     General: Abdomen is flat.     Palpations: Abdomen is soft.  Musculoskeletal:        General: Normal range of motion.     Cervical back: Normal range of motion.  Skin:    General: Skin is warm and dry.  Neurological:     Mental Status: She is alert and oriented to person, place, and time.  Psychiatric:        Attention and Perception: Attention and perception normal. She does not perceive auditory or visual hallucinations.        Mood and Affect: Mood and affect normal.        Speech: Speech normal.        Behavior: Behavior normal. Behavior is cooperative.        Thought Content: Thought content normal. Thought content is not paranoid or delusional. Thought content does not include homicidal or suicidal ideation. Thought content does not include homicidal or suicidal plan.        Cognition and Memory: Cognition and memory normal.        Judgment: Judgment normal. Judgment is not impulsive or inappropriate.    Review of Systems  Psychiatric/Behavioral:  Positive for substance abuse. Negative for depression and suicidal ideas.   All other systems reviewed and are negative.  Blood pressure 116/76, pulse 82, temperature 97.9 F (36.6 C), temperature source Oral, resp. rate 16, height '5\' 5"'$  (1.651 m), weight 60.8 kg, SpO2 99 %.  Body mass index is 22.3 kg/m.  Mental Status Per Nursing Assessment::   On Admission:  NA  Demographic Factors:  Caucasian  Loss Factors: Decrease in vocational status and Loss of significant relationship  Historical Factors: Family history of mental illness or substance abuse  Risk Reduction Factors:   Sense of responsibility to family, Religious beliefs about death, Living with another person, especially a relative, Positive social support, and Positive therapeutic relationship  Continued Clinical Symptoms:  Depression:   Recent sense of peace/wellbeing Alcohol/Substance Abuse/Dependencies  Cognitive Features That Contribute To Risk:  None    Suicide Risk:  Moderate:  Frequent suicidal ideation with limited intensity, and duration, some specificity in terms of plans, no associated intent, good self-control, limited dysphoria/symptomatology, some risk factors present, and identifiable protective factors, including available and accessible social support.   Follow-up Information     [facility] Follow up.   Why: You may call this provider to schedule an appointment for therapy services if you have proof of insurance. Contact information: [place] [address] [phone]         [facility] [place]  Follow up.   Why: You may go to this provider for an assessment, to obtain group therapy, and medication management services  on Monday through Friday, from 9 am to 3 pm.  You can also ask about their [place] location, if that would be closer. Contact information: [address] [place] [address] [phone]         [facility]. Go on 01/02/2023.   Specialty: Behavioral Health Why: Please go to this provider for an assessment, to obtain group therapy, and medication management services on Monday through Friday; arrive by 7:20 am.  Assessments are provided on a first come, first served  basis. Contact information: [place]. Call.   Why: You may call this provider to schedule an appointment for substance abuse intensive outpatient therapy services. Contact information: [address] [place], [place] [address]  Phone: [phone]               Plan Of Care/Follow-up recommendations:   Activity: As tolerated  Diet: Heart healthy  Other: -Follow-up with your outpatient psychiatric provider -instructions on appointment date, time, and address (location) are provided to you in discharge paperwork.   -Take your psychiatric medications as prescribed at discharge - instructions are provided to you in the discharge paperwork.    -Follow-up with outpatient primary care doctor and other specialists -for management of preventative medicine and chronic medical disease, including:   -Testing: Follow-up with outpatient provider for abnormal lab results:  none   -Recommend abstinence from alcohol, tobacco, and other illicit drug use at discharge.    -If your psychiatric symptoms recur, worsen, or if you have side effects to your psychiatric medications, call your outpatient psychiatric provider, 911, [phone] or go to the nearest emergency department.   -If suicidal thoughts recur, call your outpatient psychiatric provider, 911, [phone] or go to the nearest emergency department.  Mary Merlin, NP 12/30/2022, 10:54 AM

## 2022-12-30 NOTE — Progress Notes (Signed)
  Patient reports SOB and wheezing, PRN Albuterol 2 puffs administered per MAR. No distress noted.    12/29/22 2135  Psych Admission Type (Psych Patients Only)  Admission Status Voluntary  Psychosocial Assessment  Patient Complaints None  Eye Contact Fair  Facial Expression Anxious  Affect Depressed  Speech Logical/coherent  Interaction Assertive  Motor Activity Other (Comment)  Appearance/Hygiene Unremarkable  Behavior Characteristics Cooperative;Appropriate to situation;Calm  Mood Depressed  Thought Process  Coherency WDL  Content WDL  Delusions None reported or observed  Perception WDL  Hallucination None reported or observed  Judgment Impaired  Confusion None  Danger to Self  Current suicidal ideation? Denies  Self-Injurious Behavior No self-injurious ideation or behavior indicators observed or expressed   Agreement Not to Harm Self Yes  Description of Agreement Verbal  Danger to Others  Danger to Others None reported or observed

## 2022-12-30 NOTE — Group Note (Signed)
LCSW Group Therapy Note   No social work group was held; the following was provided to each patient  in lieu of in-person group:  Healthy vs. 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

## 2023-04-09 ENCOUNTER — Other Ambulatory Visit: Payer: Self-pay

## 2023-04-09 ENCOUNTER — Encounter (HOSPITAL_COMMUNITY): Payer: Self-pay

## 2023-04-09 ENCOUNTER — Inpatient Hospital Stay (HOSPITAL_COMMUNITY)
Admission: EM | Admit: 2023-04-09 | Discharge: 2023-04-12 | DRG: 202 | Disposition: A | Discharge status: Home / Self Care | Payer: 59 | Attending: Family Medicine | Admitting: Family Medicine

## 2023-04-09 ENCOUNTER — Emergency Department (HOSPITAL_COMMUNITY): Payer: 59

## 2023-04-09 DIAGNOSIS — F10239 Alcohol dependence with withdrawal, unspecified: Secondary | ICD-10-CM | POA: Diagnosis not present

## 2023-04-09 DIAGNOSIS — R519 Headache, unspecified: Secondary | ICD-10-CM | POA: Diagnosis not present

## 2023-04-09 DIAGNOSIS — Z8551 Personal history of malignant neoplasm of bladder: Secondary | ICD-10-CM

## 2023-04-09 DIAGNOSIS — R911 Solitary pulmonary nodule: Secondary | ICD-10-CM | POA: Diagnosis present

## 2023-04-09 DIAGNOSIS — F10939 Alcohol use, unspecified with withdrawal, unspecified: Secondary | ICD-10-CM | POA: Diagnosis present

## 2023-04-09 DIAGNOSIS — J45901 Unspecified asthma with (acute) exacerbation: Principal | ICD-10-CM | POA: Diagnosis present

## 2023-04-09 DIAGNOSIS — G4730 Sleep apnea, unspecified: Secondary | ICD-10-CM | POA: Diagnosis present

## 2023-04-09 DIAGNOSIS — F332 Major depressive disorder, recurrent severe without psychotic features: Secondary | ICD-10-CM | POA: Diagnosis present

## 2023-04-09 DIAGNOSIS — Z79899 Other long term (current) drug therapy: Secondary | ICD-10-CM

## 2023-04-09 DIAGNOSIS — E872 Acidosis, unspecified: Secondary | ICD-10-CM | POA: Diagnosis present

## 2023-04-09 DIAGNOSIS — F419 Anxiety disorder, unspecified: Secondary | ICD-10-CM | POA: Diagnosis present

## 2023-04-09 DIAGNOSIS — M1909 Primary osteoarthritis, other specified site: Secondary | ICD-10-CM | POA: Diagnosis present

## 2023-04-09 DIAGNOSIS — N393 Stress incontinence (female) (male): Secondary | ICD-10-CM | POA: Diagnosis present

## 2023-04-09 DIAGNOSIS — Z716 Tobacco abuse counseling: Secondary | ICD-10-CM

## 2023-04-09 DIAGNOSIS — D649 Anemia, unspecified: Secondary | ICD-10-CM | POA: Diagnosis present

## 2023-04-09 DIAGNOSIS — D751 Secondary polycythemia: Secondary | ICD-10-CM | POA: Diagnosis present

## 2023-04-09 DIAGNOSIS — F17290 Nicotine dependence, other tobacco product, uncomplicated: Secondary | ICD-10-CM | POA: Diagnosis present

## 2023-04-09 DIAGNOSIS — R17 Unspecified jaundice: Secondary | ICD-10-CM | POA: Diagnosis present

## 2023-04-09 DIAGNOSIS — F10220 Alcohol dependence with intoxication, uncomplicated: Secondary | ICD-10-CM | POA: Diagnosis present

## 2023-04-09 DIAGNOSIS — Z885 Allergy status to narcotic agent status: Secondary | ICD-10-CM

## 2023-04-09 DIAGNOSIS — F10231 Alcohol dependence with withdrawal delirium: Secondary | ICD-10-CM | POA: Diagnosis not present

## 2023-04-09 DIAGNOSIS — J441 Chronic obstructive pulmonary disease with (acute) exacerbation: Secondary | ICD-10-CM

## 2023-04-09 DIAGNOSIS — F909 Attention-deficit hyperactivity disorder, unspecified type: Secondary | ICD-10-CM | POA: Diagnosis present

## 2023-04-09 DIAGNOSIS — Y908 Blood alcohol level of 240 mg/100 ml or more: Secondary | ICD-10-CM | POA: Diagnosis present

## 2023-04-09 DIAGNOSIS — F10920 Alcohol use, unspecified with intoxication, uncomplicated: Secondary | ICD-10-CM

## 2023-04-09 LAB — COMPREHENSIVE METABOLIC PANEL
ALT: 22 U/L (ref 0–44)
AST: 40 U/L (ref 15–41)
Albumin: 4.7 g/dL (ref 3.5–5.0)
Alkaline Phosphatase: 85 U/L (ref 38–126)
Anion gap: 14 (ref 5–15)
BUN: 9 mg/dL (ref 6–20)
CO2: 21 mmol/L — ABNORMAL LOW (ref 22–32)
Calcium: 8.9 mg/dL (ref 8.9–10.3)
Chloride: 104 mmol/L (ref 98–111)
Creatinine, Ser: 0.91 mg/dL (ref 0.44–1.00)
GFR, Estimated: >60 mL/min (ref >60)
Glucose, Bld: 125 mg/dL — ABNORMAL HIGH (ref 70–99)
Potassium: 5.1 mmol/L (ref 3.5–5.1)
Sodium: 139 mmol/L (ref 135–145)
Total Bilirubin: 1.3 mg/dL — ABNORMAL HIGH (ref 0.3–1.2)
Total Protein: 7.6 g/dL (ref 6.5–8.1)

## 2023-04-09 LAB — CBC WITH DIFFERENTIAL/PLATELET
Abs Immature Granulocytes: 0.06 10*3/uL (ref 0.00–0.07)
Basophils Absolute: 0 10*3/uL (ref 0.0–0.1)
Basophils Relative: 1 %
Eosinophils Absolute: 0 10*3/uL (ref 0.0–0.5)
Eosinophils Relative: 0 %
HCT: 51.8 % — ABNORMAL HIGH (ref 36.0–46.0)
Hemoglobin: 17.2 g/dL — ABNORMAL HIGH (ref 12.0–15.0)
Immature Granulocytes: 1 %
Lymphocytes Relative: 35 %
Lymphs Abs: 2.3 10*3/uL (ref 0.7–4.0)
MCH: 32 pg (ref 26.0–34.0)
MCHC: 33.2 g/dL (ref 30.0–36.0)
MCV: 96.5 fL (ref 80.0–100.0)
Monocytes Absolute: 0.1 10*3/uL (ref 0.1–1.0)
Monocytes Relative: 2 %
Neutro Abs: 4 10*3/uL (ref 1.7–7.7)
Neutrophils Relative %: 61 %
Platelets: UNDETERMINED 10*3/uL (ref 150–400)
RBC: 5.37 MIL/uL — ABNORMAL HIGH (ref 3.87–5.11)
RDW: 13.5 % (ref 11.5–15.5)
WBC: 6.4 10*3/uL (ref 4.0–10.5)
nRBC: 0 % (ref 0.0–0.2)

## 2023-04-09 LAB — ETHANOL: Alcohol, Ethyl (B): 309 mg/dL (ref <10)

## 2023-04-09 LAB — BRAIN NATRIURETIC PEPTIDE: B Natriuretic Peptide: 72.9 pg/mL (ref 0.0–100.0)

## 2023-04-09 LAB — TROPONIN I (HIGH SENSITIVITY): Troponin I (High Sensitivity): 8 ng/L (ref <18)

## 2023-04-09 LAB — D-DIMER, QUANTITATIVE: D-Dimer, Quant: 1.14 ug/mL-FEU — ABNORMAL HIGH (ref 0.00–0.50)

## 2023-04-09 MED ORDER — HYDROXYZINE HCL 25 MG PO TABS
25.0000 mg | ORAL_TABLET | Freq: Four times a day (QID) | ORAL | Status: DC | PRN
  Administered 2023-04-10 – 2023-04-11 (×2): 25 mg via ORAL
  Filled 2023-04-09 (×2): qty 1

## 2023-04-09 MED ORDER — SUMATRIPTAN SUCCINATE 25 MG PO TABS
25.0000 mg | ORAL_TABLET | ORAL | Status: DC | PRN
  Administered 2023-04-10 – 2023-04-12 (×4): 25 mg via ORAL
  Filled 2023-04-09 (×8): qty 1

## 2023-04-09 MED ORDER — LAMOTRIGINE 25 MG PO TABS
25.0000 mg | ORAL_TABLET | Freq: Every day | ORAL | Status: DC
  Administered 2023-04-10 – 2023-04-12 (×3): 25 mg via ORAL
  Filled 2023-04-09 (×3): qty 1

## 2023-04-09 MED ORDER — ACETAMINOPHEN 500 MG PO TABS
1000.0000 mg | ORAL_TABLET | Freq: Once | ORAL | Status: AC
  Administered 2023-04-09: 1000 mg via ORAL
  Filled 2023-04-09: qty 2

## 2023-04-09 MED ORDER — IOHEXOL 350 MG/ML SOLN
75.0000 mL | Freq: Once | INTRAVENOUS | Status: AC | PRN
  Administered 2023-04-09: 75 mL via INTRAVENOUS

## 2023-04-09 MED ORDER — FOLIC ACID 1 MG PO TABS
1.0000 mg | ORAL_TABLET | Freq: Every day | ORAL | Status: DC
  Administered 2023-04-10 – 2023-04-12 (×3): 1 mg via ORAL
  Filled 2023-04-09 (×3): qty 1

## 2023-04-09 MED ORDER — LORAZEPAM 2 MG/ML IJ SOLN
1.0000 mg | INTRAMUSCULAR | Status: DC | PRN
  Administered 2023-04-10 (×3): 2 mg via INTRAVENOUS
  Filled 2023-04-09 (×3): qty 1

## 2023-04-09 MED ORDER — PROPRANOLOL HCL 10 MG PO TABS
10.0000 mg | ORAL_TABLET | Freq: Three times a day (TID) | ORAL | Status: DC
  Filled 2023-04-09: qty 1

## 2023-04-09 MED ORDER — THIAMINE HCL 100 MG/ML IJ SOLN: 100.0000 mg | Freq: Every day | INTRAMUSCULAR | Status: DC

## 2023-04-09 MED ORDER — ENOXAPARIN SODIUM 40 MG/0.4ML IJ SOSY
40.0000 mg | PREFILLED_SYRINGE | Freq: Every day | INTRAMUSCULAR | Status: DC
  Administered 2023-04-10 – 2023-04-12 (×3): 40 mg via SUBCUTANEOUS
  Filled 2023-04-09 (×3): qty 0.4

## 2023-04-09 MED ORDER — ADULT MULTIVITAMIN W/MINERALS CH
1.0000 | ORAL_TABLET | Freq: Every day | ORAL | Status: DC
  Administered 2023-04-10 – 2023-04-12 (×3): 1 via ORAL
  Filled 2023-04-09 (×3): qty 1

## 2023-04-09 MED ORDER — TRAZODONE HCL 50 MG PO TABS: 50.0000 mg | ORAL_TABLET | Freq: Every evening | ORAL | Status: DC | PRN

## 2023-04-09 MED ORDER — THIAMINE MONONITRATE 100 MG PO TABS
100.0000 mg | ORAL_TABLET | Freq: Every day | ORAL | Status: DC
  Administered 2023-04-10 – 2023-04-12 (×3): 100 mg via ORAL
  Filled 2023-04-09 (×3): qty 1

## 2023-04-09 MED ORDER — SODIUM CHLORIDE 0.9 % IV SOLN: INTRAVENOUS | Status: AC

## 2023-04-09 MED ORDER — LORAZEPAM 1 MG PO TABS
1.0000 mg | ORAL_TABLET | ORAL | Status: DC | PRN
  Administered 2023-04-10: 1 mg via ORAL
  Administered 2023-04-11 (×2): 2 mg via ORAL
  Filled 2023-04-09 (×2): qty 2
  Filled 2023-04-09: qty 1

## 2023-04-09 MED ORDER — LACTATED RINGERS IV BOLUS
1000.0000 mL | Freq: Once | INTRAVENOUS | Status: AC
  Administered 2023-04-09: 1000 mL via INTRAVENOUS

## 2023-04-09 MED ORDER — IPRATROPIUM-ALBUTEROL 0.5-2.5 (3) MG/3ML IN SOLN
3.0000 mL | Freq: Once | RESPIRATORY_TRACT | Status: AC
  Administered 2023-04-10: 3 mL via RESPIRATORY_TRACT
  Filled 2023-04-09: qty 3

## 2023-04-09 MED ORDER — ESCITALOPRAM OXALATE 10 MG PO TABS
10.0000 mg | ORAL_TABLET | Freq: Every day | ORAL | Status: DC
  Administered 2023-04-10 – 2023-04-12 (×3): 10 mg via ORAL
  Filled 2023-04-09 (×3): qty 1

## 2023-04-09 MED ORDER — NICOTINE 7 MG/24HR TD PT24
7.0000 mg | MEDICATED_PATCH | Freq: Every day | TRANSDERMAL | Status: DC
  Administered 2023-04-10 – 2023-04-12 (×3): 7 mg via TRANSDERMAL
  Filled 2023-04-09 (×4): qty 1

## 2023-04-09 MED ORDER — GABAPENTIN 100 MG PO CAPS
200.0000 mg | ORAL_CAPSULE | Freq: Three times a day (TID) | ORAL | Status: DC
  Administered 2023-04-10 – 2023-04-12 (×8): 200 mg via ORAL
  Filled 2023-04-09 (×8): qty 2

## 2023-04-09 NOTE — Discharge Instructions (Signed)
Your CTA was negative for a blood clot:  IMPRESSION:  1. No evidence for pulmonary embolism or other acute cardiopulmonary  process.  2. Right solid pulmonary nodule measuring 4 mm. Per Fleischner  Society Guidelines, no routine follow-up imaging is recommended.  These guidelines do not apply to immunocompromised patients and  patients with cancer. Follow up in patients with significant  comorbidities as clinically warranted. For lung cancer screening,  adhere to Lung-RADS guidelines. Reference: Radiology. 2017;  284(1):228-43.   Symptoms are consistent with likely asthma exacerbation.

## 2023-04-09 NOTE — ED Provider Notes (Addendum)
[facility] EMERGENCY DEPARTMENT AT [facility] Provider Note   CSN: [id number] Arrival date & time: 04/09/23  1935     History  Chief Complaint  Patient presents with   Shortness of Breath    Pt arrived via ems from home with etoh on board c/o 1 week hx sob was prescribed abx prednisone and neb tx by pcp. EMS gave 125 solumedrol 2g MAG Duoneb EMS VS 144/76 106 95RA 18    [name] is a 57 y.o. female.   Shortness of Breath    57 year old female presenting with medical history significant for ADHD, anxiety, anemia, alcohol dependence, MDD, asthma/COPD (follows outpatient with [facility] pulm) presenting to the emergency department with a chief complaint of shortness of breath.  The patient states that she has been having shortness of breath with dyspnea for the past 3 weeks.  She has been on nebulizer treatments prescribed by her PCP and has not had any significant relief.  She endorses right-sided chest discomfort, worse when she takes a deep breath.  She denies any history of DVT or PE.  She denies any lower extremity swelling.  She denies any recent long car travel or plane flights.  She states that her inhalers have not helped and her nebulizers have not helped her dyspnea.  She denies any left-sided chest pressure.  Her right-sided pain is dull and intermittently sometimes pleuritic.  She endorses a dry nonproductive cough. Additionally, she has been on multiple courses of ABX prescribed by her pulmonologist.  Home Medications Prior to Admission medications   Medication Sig Start Date End Date Taking? Authorizing Provider  acamprosate (CAMPRAL) 333 MG tablet Take 2 tablets (666 mg total) by mouth 3 (three) times daily with meals. 12/30/22   Leevy-Johnson, Brooke A, NP  escitalopram (LEXAPRO) 10 MG tablet Take 1 tablet (10 mg total) by mouth daily. 12/31/22   Leevy-Johnson, Lina Sar, NP  gabapentin (NEURONTIN) 100 MG capsule Take 2 capsules (200 mg total) by mouth every 8  (eight) hours. 12/30/22   Leevy-Johnson, Lina Sar, NP  hydrOXYzine (ATARAX) 25 MG tablet Take 1 tablet (25 mg total) by mouth every 6 (six) hours as needed (anxiety/agitation or CIWA < or = 10). 12/30/22   Leevy-Johnson, Lina Sar, NP  lamoTRIgine (LAMICTAL) 25 MG tablet Take 1 tablet (25 mg total) by mouth daily. 12/31/22   Leevy-Johnson, Brooke A, NP  nicotine (NICODERM CQ - DOSED IN MG/24 HR) 7 mg/24hr patch Place 1 patch (7 mg total) onto the skin daily. 12/31/22   Leevy-Johnson, Lina Sar, NP  propranolol (INDERAL) 10 MG tablet Take 1 tablet (10 mg total) by mouth every 8 (eight) hours. 12/30/22   Leevy-Johnson, Lina Sar, NP  SUMAtriptan (IMITREX) 25 MG tablet Take 1 tablet (25 mg total) by mouth every 2 (two) hours as needed for migraine or headache. May repeat in 2 hours if headache persists or recurs. 12/30/22   Leevy-Johnson, Lina Sar, NP  thiamine (VITAMIN B-1) 100 MG tablet Take 1 tablet (100 mg total) by mouth daily. 12/31/22   Leevy-Johnson, Lina Sar, NP  traZODone (DESYREL) 50 MG tablet Take 1 tablet (50 mg total) by mouth at bedtime as needed for sleep. 12/30/22   Leevy-Johnson, Lina Sar, NP      Allergies    Codeine and Hydrocodone    Review of Systems   Review of Systems  Respiratory:  Positive for shortness of breath.   All other systems reviewed and are negative.   Physical Exam Updated  Vital Signs BP (!) 148/83   Pulse (!) 113   Temp 98.1 F (36.7 C)   Resp 20   Ht 5\' 5"  (1.651 m)   Wt 68 kg   SpO2 95%   BMI 24.96 kg/m  Physical Exam Vitals and nursing note reviewed.  Constitutional:      General: She is not in acute distress.    Appearance: She is well-developed.  HENT:     Head: Normocephalic and atraumatic.  Eyes:     Conjunctiva/sclera: Conjunctivae normal.  Cardiovascular:     Rate and Rhythm: Regular rhythm. Tachycardia present.     Pulses: Normal pulses.     Heart sounds: No murmur heard. Pulmonary:     Effort: Pulmonary effort is normal. No respiratory  distress.     Breath sounds: Normal breath sounds. No wheezing.  Abdominal:     Palpations: Abdomen is soft.     Tenderness: There is no abdominal tenderness.  Musculoskeletal:        General: No swelling.     Cervical back: Neck supple.     Right lower leg: No edema.     Left lower leg: No edema.  Skin:    General: Skin is warm and dry.     Capillary Refill: Capillary refill takes less than 2 seconds.  Neurological:     General: No focal deficit present.     Mental Status: She is alert and oriented to person, place, and time.     Comments: Appears intoxicated  Psychiatric:        Mood and Affect: Mood is anxious. Affect is tearful.     Comments: Unable to fully assess, appears intoxicated     ED Results / Procedures / Treatments   Labs (all labs ordered are listed, but only abnormal results are displayed) Labs Reviewed  CBC WITH DIFFERENTIAL/PLATELET - Abnormal; Notable for the following components:      Result Value   RBC 5.37 (*)    Hemoglobin 17.2 (*)    HCT 51.8 (*)    All other components within normal limits  COMPREHENSIVE METABOLIC PANEL - Abnormal; Notable for the following components:   CO2 21 (*)    Glucose, Bld 125 (*)    Total Bilirubin 1.3 (*)    All other components within normal limits  ETHANOL - Abnormal; Notable for the following components:   Alcohol, Ethyl (B) 309 (*)    All other components within normal limits  D-DIMER, QUANTITATIVE - Abnormal; Notable for the following components:   D-Dimer, Quant 1.14 (*)    All other components within normal limits  BRAIN NATRIURETIC PEPTIDE  TROPONIN I (HIGH SENSITIVITY)    EKG EKG Interpretation  Date/Time:  Monday Apr 09 2023 19:48:06 EDT Ventricular Rate:  107 PR Interval:  123 QRS Duration: 90 QT Interval:  330 QTC Calculation: 441 R Axis:   76 Text Interpretation: Sinus tachycardia Ventricular premature complex Aberrant complex Confirmed by Ernie Avena ([id number]) on 04/09/2023 8:29:01  PM  Radiology CT Angio Chest PE W and/or Wo Contrast  Result Date: 04/09/2023 CLINICAL DATA:  High probability for PE EXAM: CT ANGIOGRAPHY CHEST WITH CONTRAST TECHNIQUE: Multidetector CT imaging of the chest was performed using the standard protocol during bolus administration of intravenous contrast. Multiplanar CT image reconstructions and MIPs were obtained to evaluate the vascular anatomy. RADIATION DOSE REDUCTION: This exam was performed according to the departmental dose-optimization program which includes automated exposure control, adjustment of the mA and/or kV according to patient  size and/or use of iterative reconstruction technique. CONTRAST:  75mL OMNIPAQUE IOHEXOL 350 MG/ML SOLN COMPARISON:  CT chest abdomen and pelvis 10/05/2013 FINDINGS: Cardiovascular: Satisfactory opacification of the pulmonary arteries to the segmental level. No evidence of pulmonary embolism. Normal heart size. No pericardial effusion. Mediastinum/Nodes: No enlarged mediastinal, hilar, or axillary lymph nodes. Thyroid gland, trachea, and esophagus demonstrate no significant findings. Lungs/Pleura: Minimal emphysematous changes are present. There is scarring in the lung apices. There is a 4 mm nodule in the right lower lobe image 7/109 which is new from prior. There is no lung consolidation, pleural effusion or pneumothorax. Upper Abdomen: No acute abnormality. Musculoskeletal: Orthopedic pins are seen in the right humeral head. No acute fractures are seen. Review of the MIP images confirms the above findings. IMPRESSION: 1. No evidence for pulmonary embolism or other acute cardiopulmonary process. 2. Right solid pulmonary nodule measuring 4 mm. Per Fleischner Society Guidelines, no routine follow-up imaging is recommended. These guidelines do not apply to immunocompromised patients and patients with cancer. Follow up in patients with significant comorbidities as clinically warranted. For lung cancer screening, adhere to  Lung-RADS guidelines. Reference: Radiology. 2017; 284(1):228-43. Electronically Signed   By: Darliss Cheney M.D.   On: 04/09/2023 23:25   DG Chest 2 View  Result Date: 04/09/2023 CLINICAL DATA:  Shortness of breath. Patient reports symptoms for 3 weeks. EXAM: CHEST - 2 VIEW COMPARISON:  Radiograph and CT 01/04/2016 FINDINGS: The cardiomediastinal contours are normal. Subsegmental linear atelectasis in the right middle low. Pulmonary vasculature is normal. No consolidation, pleural effusion, or pneumothorax. Right proximal humeral hardware partially included. At least 2 midthoracic compression deformities, age indeterminate but new from 2017. IMPRESSION: Subsegmental atelectasis in the right mid lung. Electronically Signed   By: Narda Rutherford M.D.   On: 04/09/2023 21:17    Procedures Procedures    Medications Ordered in ED Medications  ipratropium-albuterol (DUONEB) 0.5-2.5 (3) MG/3ML nebulizer solution 3 mL (has no administration in time range)  lactated ringers bolus 1,000 mL (0 mLs Intravenous Stopped 04/09/23 2310)  acetaminophen (TYLENOL) tablet 1,000 mg (1,000 mg Oral Given 04/09/23 2157)  iohexol (OMNIPAQUE) 350 MG/ML injection 75 mL (75 mLs Intravenous Contrast Given 04/09/23 2310)    ED Course/ Medical Decision Making/ A&P Clinical Course as of 04/09/23 2337  Mon Apr 09, 2023  2238 Alcohol, Ethyl (B)(!!): 309 [JL]  2238 D-Dimer, Quant(!): 1.14 [JL]    Clinical Course User Index [JL] Ernie Avena, MD                             Medical Decision Making Amount and/or Complexity of Data Reviewed Labs: ordered. Decision-making details documented in ED Course. Radiology: ordered.  Risk OTC drugs. Prescription drug management. Decision regarding hospitalization.    57 year old female presenting with medical history significant for ADHD, anxiety, anemia, alcohol dependence, MDD, asthma/COPD (follows outpatient with [facility] pulm) presenting to the emergency department with a chief  complaint of shortness of breath.  The patient states that she has been having shortness of breath with dyspnea for the past 3 weeks.  She has been on nebulizer treatments prescribed by her PCP and has not had any significant relief.  She endorses right-sided chest discomfort, worse when she takes a deep breath.  She denies any history of DVT or PE.  She denies any lower extremity swelling.  She denies any recent long car travel or plane flights.  She states that her inhalers have not  helped and her nebulizers have not helped her dyspnea.  She denies any left-sided chest pressure.  Her right-sided pain is dull and intermittently sometimes pleuritic.  She endorses a dry nonproductive cough. Additionally, she has been on multiple courses of ABX prescribed by her pulmonologist.  On arrival, the patient was afebrile, tachycardic heart rate 106, RR 18, BP 144/82, saturating 95% on room air.  Patient was administered DuoNebs, Solu-Medrol and magnesium with EMS.  Sinus tachycardia was noted on cardiac telemetry.  Physical exam significant for clear lungs with good air movement, no lower extremity edema, tearful female who appears slightly intoxicated.  Differential diagnosis includes pneumonia, pulmonary embolism, pneumothorax, asthma exacerbation, viral URI, pericarditis, less likely ACS or CHF.  Initial EKG revealed sinus tachycardia, ventricular rate 107, PVC present, no STEMI.  Chest x-ray was performed revealed subsegmental atelectasis in the right mid lung.  Laboratory evaluation significant for BNP normal, CMP without significant abnormality, D-dimer elevated to 1.14, ethanol level elevated to 309, troponin normal, CBC without a leukocytosis, evidence of hemoconcentration with a hemoglobin of 17.2.  The patient was administered an LR bolus and administered Tylenol as she began to complain of a headache.  Discussed the concern for PE with an elevated D-dimer and need for CT angiogram.  Will obtain CTA to  further evaluate.  CTA PE: IMPRESSION:  1. No evidence for pulmonary embolism or other acute cardiopulmonary  process.  2. Right solid pulmonary nodule measuring 4 mm. Per Fleischner  Society Guidelines, no routine follow-up imaging is recommended.  These guidelines do not apply to immunocompromised patients and  patients with cancer. Follow up in patients with significant  comorbidities as clinically warranted. For lung cancer screening,  adhere to Lung-RADS guidelines. Reference: Radiology. 2017;  284(1):228-43.    On repeat evaluation, the patient was persistently wheezing, was administered additional breathing treatment.  She remains persistently symptomatic.  Recommended admission for observation in the setting of an asthma exacerbation, Dr. Haroldine Laws accepting.    Final Clinical Impression(s) / ED Diagnoses Final diagnoses:  Alcoholic intoxication without complication (HCC)  Exacerbation of asthma, unspecified asthma severity, unspecified whether persistent  COPD exacerbation [facility])    Rx / DC Orders ED Discharge Orders     None         Ernie Avena, MD 04/09/23 7829    Ernie Avena, MD 04/09/23 2355

## 2023-04-09 NOTE — ED Triage Notes (Signed)
ARRIVED VIA EMS FROM HOME ETOH ON BOARD C/O 1 WEEK HX SOB .PT RX PREDNISONE ABX AND NEB TX BY PCP. EMS GAVE 2 G MAG 125 SOLUMEDROL DUONEB PTA

## 2023-04-10 DIAGNOSIS — E872 Acidosis, unspecified: Secondary | ICD-10-CM | POA: Diagnosis present

## 2023-04-10 DIAGNOSIS — M1909 Primary osteoarthritis, other specified site: Secondary | ICD-10-CM | POA: Diagnosis present

## 2023-04-10 DIAGNOSIS — F10939 Alcohol use, unspecified with withdrawal, unspecified: Secondary | ICD-10-CM | POA: Diagnosis present

## 2023-04-10 DIAGNOSIS — D751 Secondary polycythemia: Secondary | ICD-10-CM | POA: Diagnosis present

## 2023-04-10 DIAGNOSIS — R519 Headache, unspecified: Secondary | ICD-10-CM | POA: Diagnosis not present

## 2023-04-10 DIAGNOSIS — N393 Stress incontinence (female) (male): Secondary | ICD-10-CM | POA: Diagnosis present

## 2023-04-10 DIAGNOSIS — F17290 Nicotine dependence, other tobacco product, uncomplicated: Secondary | ICD-10-CM | POA: Diagnosis present

## 2023-04-10 DIAGNOSIS — Z716 Tobacco abuse counseling: Secondary | ICD-10-CM | POA: Diagnosis not present

## 2023-04-10 DIAGNOSIS — Z885 Allergy status to narcotic agent status: Secondary | ICD-10-CM | POA: Diagnosis not present

## 2023-04-10 DIAGNOSIS — F10239 Alcohol dependence with withdrawal, unspecified: Secondary | ICD-10-CM | POA: Diagnosis not present

## 2023-04-10 DIAGNOSIS — R17 Unspecified jaundice: Secondary | ICD-10-CM | POA: Diagnosis present

## 2023-04-10 DIAGNOSIS — R911 Solitary pulmonary nodule: Secondary | ICD-10-CM | POA: Diagnosis present

## 2023-04-10 DIAGNOSIS — J45901 Unspecified asthma with (acute) exacerbation: Secondary | ICD-10-CM | POA: Diagnosis not present

## 2023-04-10 DIAGNOSIS — Z79899 Other long term (current) drug therapy: Secondary | ICD-10-CM | POA: Diagnosis not present

## 2023-04-10 DIAGNOSIS — F10930 Alcohol use, unspecified with withdrawal, uncomplicated: Secondary | ICD-10-CM | POA: Diagnosis not present

## 2023-04-10 DIAGNOSIS — D649 Anemia, unspecified: Secondary | ICD-10-CM | POA: Diagnosis present

## 2023-04-10 DIAGNOSIS — J441 Chronic obstructive pulmonary disease with (acute) exacerbation: Secondary | ICD-10-CM | POA: Diagnosis not present

## 2023-04-10 DIAGNOSIS — Z8551 Personal history of malignant neoplasm of bladder: Secondary | ICD-10-CM | POA: Diagnosis not present

## 2023-04-10 DIAGNOSIS — F909 Attention-deficit hyperactivity disorder, unspecified type: Secondary | ICD-10-CM | POA: Diagnosis present

## 2023-04-10 DIAGNOSIS — F332 Major depressive disorder, recurrent severe without psychotic features: Secondary | ICD-10-CM | POA: Diagnosis present

## 2023-04-10 DIAGNOSIS — F419 Anxiety disorder, unspecified: Secondary | ICD-10-CM | POA: Diagnosis present

## 2023-04-10 DIAGNOSIS — G4730 Sleep apnea, unspecified: Secondary | ICD-10-CM | POA: Diagnosis present

## 2023-04-10 DIAGNOSIS — F10220 Alcohol dependence with intoxication, uncomplicated: Secondary | ICD-10-CM | POA: Diagnosis present

## 2023-04-10 DIAGNOSIS — Y908 Blood alcohol level of 240 mg/100 ml or more: Secondary | ICD-10-CM | POA: Diagnosis present

## 2023-04-10 DIAGNOSIS — F10231 Alcohol dependence with withdrawal delirium: Secondary | ICD-10-CM | POA: Diagnosis present

## 2023-04-10 LAB — COMPREHENSIVE METABOLIC PANEL
ALT: 22 U/L (ref 0–44)
AST: 25 U/L (ref 15–41)
Albumin: 3.7 g/dL (ref 3.5–5.0)
Alkaline Phosphatase: 77 U/L (ref 38–126)
Anion gap: 16 — ABNORMAL HIGH (ref 5–15)
BUN: 5 mg/dL — ABNORMAL LOW (ref 6–20)
CO2: 16 mmol/L — ABNORMAL LOW (ref 22–32)
Calcium: 8.3 mg/dL — ABNORMAL LOW (ref 8.9–10.3)
Chloride: 102 mmol/L (ref 98–111)
Creatinine, Ser: 0.7 mg/dL (ref 0.44–1.00)
GFR, Estimated: >60 mL/min (ref >60)
Glucose, Bld: 133 mg/dL — ABNORMAL HIGH (ref 70–99)
Potassium: 3.6 mmol/L (ref 3.5–5.1)
Sodium: 134 mmol/L — ABNORMAL LOW (ref 135–145)
Total Bilirubin: 0.4 mg/dL (ref 0.3–1.2)
Total Protein: 6.8 g/dL (ref 6.5–8.1)

## 2023-04-10 LAB — CBC
HCT: 43.7 % (ref 36.0–46.0)
Hemoglobin: 14.8 g/dL (ref 12.0–15.0)
MCH: 32.2 pg (ref 26.0–34.0)
MCHC: 33.9 g/dL (ref 30.0–36.0)
MCV: 95 fL (ref 80.0–100.0)
Platelets: 208 10*3/uL (ref 150–400)
RBC: 4.6 MIL/uL (ref 3.87–5.11)
RDW: 13.6 % (ref 11.5–15.5)
WBC: 8.2 10*3/uL (ref 4.0–10.5)
nRBC: 0 % (ref 0.0–0.2)

## 2023-04-10 LAB — PHOSPHORUS: Phosphorus: 2.5 mg/dL (ref 2.5–4.6)

## 2023-04-10 LAB — MAGNESIUM: Magnesium: 1.9 mg/dL (ref 1.7–2.4)

## 2023-04-10 MED ORDER — CHLORDIAZEPOXIDE HCL 25 MG PO CAPS: 25.0000 mg | ORAL_CAPSULE | Freq: Every day | ORAL | Status: DC

## 2023-04-10 MED ORDER — IPRATROPIUM-ALBUTEROL 0.5-2.5 (3) MG/3ML IN SOLN
3.0000 mL | Freq: Two times a day (BID) | RESPIRATORY_TRACT | Status: DC
  Administered 2023-04-10 – 2023-04-12 (×3): 3 mL via RESPIRATORY_TRACT
  Filled 2023-04-10 (×4): qty 3

## 2023-04-10 MED ORDER — CHLORDIAZEPOXIDE HCL 25 MG PO CAPS
25.0000 mg | ORAL_CAPSULE | ORAL | Status: DC
  Administered 2023-04-12: 25 mg via ORAL
  Filled 2023-04-10: qty 1

## 2023-04-10 MED ORDER — CHLORDIAZEPOXIDE HCL 25 MG PO CAPS
25.0000 mg | ORAL_CAPSULE | Freq: Four times a day (QID) | ORAL | Status: AC
  Administered 2023-04-10 (×4): 25 mg via ORAL
  Filled 2023-04-10 (×4): qty 1

## 2023-04-10 MED ORDER — LABETALOL HCL 5 MG/ML IV SOLN
5.0000 mg | INTRAVENOUS | Status: DC | PRN
  Administered 2023-04-10: 5 mg via INTRAVENOUS
  Filled 2023-04-10: qty 4

## 2023-04-10 MED ORDER — ONDANSETRON 4 MG PO TBDP: 4.0000 mg | ORAL_TABLET | Freq: Four times a day (QID) | ORAL | Status: DC | PRN

## 2023-04-10 MED ORDER — CHLORDIAZEPOXIDE HCL 25 MG PO CAPS
25.0000 mg | ORAL_CAPSULE | Freq: Three times a day (TID) | ORAL | Status: AC
  Administered 2023-04-11 (×3): 25 mg via ORAL
  Filled 2023-04-10 (×3): qty 1

## 2023-04-10 MED ORDER — METOCLOPRAMIDE HCL 5 MG/ML IJ SOLN
5.0000 mg | INTRAMUSCULAR | Status: AC
  Administered 2023-04-10: 5 mg via INTRAVENOUS
  Filled 2023-04-10: qty 2

## 2023-04-10 MED ORDER — LOPERAMIDE HCL 2 MG PO CAPS: 2.0000 mg | ORAL_CAPSULE | ORAL | Status: DC | PRN

## 2023-04-10 MED ORDER — AZITHROMYCIN 250 MG PO TABS
500.0000 mg | ORAL_TABLET | Freq: Every day | ORAL | Status: AC
  Administered 2023-04-10: 500 mg via ORAL
  Filled 2023-04-10: qty 2

## 2023-04-10 MED ORDER — LORAZEPAM 2 MG/ML IJ SOLN
0.5000 mg | Freq: Once | INTRAMUSCULAR | Status: AC
  Administered 2023-04-10: 0.5 mg via INTRAVENOUS
  Filled 2023-04-10: qty 1

## 2023-04-10 MED ORDER — DIPHENHYDRAMINE HCL 50 MG/ML IJ SOLN
12.5000 mg | INTRAMUSCULAR | Status: AC
  Administered 2023-04-10: 12.5 mg via INTRAVENOUS
  Filled 2023-04-10: qty 1

## 2023-04-10 MED ORDER — METHYLPREDNISOLONE SODIUM SUCC 125 MG IJ SOLR
80.0000 mg | Freq: Every day | INTRAMUSCULAR | Status: AC
  Administered 2023-04-10 – 2023-04-11 (×3): 80 mg via INTRAVENOUS
  Filled 2023-04-10 (×3): qty 2

## 2023-04-10 MED ORDER — KETOROLAC TROMETHAMINE 15 MG/ML IJ SOLN
15.0000 mg | INTRAMUSCULAR | Status: AC
  Administered 2023-04-10: 15 mg via INTRAVENOUS
  Filled 2023-04-10: qty 1

## 2023-04-10 MED ORDER — BUDESONIDE 0.25 MG/2ML IN SUSP
0.2500 mg | Freq: Two times a day (BID) | RESPIRATORY_TRACT | Status: DC
  Administered 2023-04-10 – 2023-04-12 (×4): 0.25 mg via RESPIRATORY_TRACT
  Filled 2023-04-10 (×4): qty 2

## 2023-04-10 MED ORDER — IPRATROPIUM-ALBUTEROL 0.5-2.5 (3) MG/3ML IN SOLN
3.0000 mL | RESPIRATORY_TRACT | Status: DC
  Administered 2023-04-10 (×3): 3 mL via RESPIRATORY_TRACT
  Filled 2023-04-10 (×3): qty 3

## 2023-04-10 MED ORDER — LABETALOL HCL 5 MG/ML IV SOLN: 5.0000 mg | INTRAVENOUS | Status: DC | PRN

## 2023-04-10 MED ORDER — AZITHROMYCIN 250 MG PO TABS
250.0000 mg | ORAL_TABLET | Freq: Every day | ORAL | Status: DC
  Administered 2023-04-10 – 2023-04-12 (×3): 250 mg via ORAL
  Filled 2023-04-10 (×3): qty 1

## 2023-04-10 MED ORDER — POTASSIUM CHLORIDE CRYS ER 20 MEQ PO TBCR
40.0000 meq | EXTENDED_RELEASE_TABLET | Freq: Once | ORAL | Status: AC
  Administered 2023-04-10: 40 meq via ORAL
  Filled 2023-04-10: qty 2

## 2023-04-10 NOTE — ED Notes (Signed)
ED TO INPATIENT HANDOFF REPORT  ED Nurse Name and Phone #: Ned Clines Name/Age/Gender [name] 57 y.o. female Room/Bed: 003C/003C  Code Status   Code Status: Full Code  Home/SNF/Other Home Patient oriented to: self, place, and time Is this baseline? Yes   Triage Complete: Triage complete  Chief Complaint Asthma exacerbation [J45.901]  Triage Note ARRIVED VIA EMS FROM HOME ETOH ON BOARD C/O 1 WEEK HX SOB .PT RX PREDNISONE ABX AND NEB TX BY PCP. EMS GAVE 2 G MAG 125 SOLUMEDROL DUONEB PTA   Allergies Allergies  Allergen Reactions   Codeine Nausea And Vomiting and Other (See Comments)    dizziness   Hydrocodone Other (See Comments)    Dizzy; Tolerates oxycodone fine    Level of Care/Admitting Diagnosis ED Disposition     ED Disposition  Admit   Condition  --   Comment  Hospital Area: [facility] [[id number]]  Level of Care: Telemetry Medical [104]  May place patient in observation at [facility] or [facility] if equivalent level of care is available:: Yes  Covid Evaluation: Asymptomatic - no recent exposure (last 10 days) testing not required  Diagnosis: Asthma exacerbation [[id number]]  Admitting Physician: Darlin Drop [[id number]]  Attending Physician: Darlin Drop [[id number]]          B Medical/Surgery History Past Medical History:  Diagnosis Date   ADHD (attention deficit hyperactivity disorder)    Alcohol dependence with acute alcoholic intoxication with complication (HCC) 01/05/2016   Anemia 1990s   Anxiety    Arthritis    neck   Asthma    uses inhalers every day   Cancer (HCC)    bladder   Cutaneous lupus erythematosus    Diverticulitis    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   History of anemia    Severe episode of recurrent major depressive disorder, without psychotic features (HCC) 01/07/2020   Sleep apnea    mild   SUI (stress urinary incontinence, female)    Past Surgical History:  Procedure  Laterality Date   BLADDER SUSPENSION N/A 02/13/2013   Procedure: [facility];  Surgeon: Anner Crete, MD;  Location: [facility];  Service: Urology;  Laterality: N/A;   CESAREAN SECTION  12-23-2004    W/ LEFT TUBAL LIGATION AND RIGHT SALPINGECTOMY   CESAREAN SECTION  1995   CYSTOSCOPY WITH STENT PLACEMENT Bilateral 06/16/2020   Procedure: CYSTOSCOPY WITH FIREFLY INJECTION;  Surgeon: Crista Elliot, MD;  Location: [facility];  Service: Urology;  Laterality: Bilateral;   IR RADIOLOGIST EVAL & MGMT  04/21/2020   IR RADIOLOGIST EVAL & MGMT  05/12/2020   ORIF HUMERUS FRACTURE Right 04/28/2014   Procedure: OPEN REDUCTION INTERNAL FIXATION (ORIF) PROXIMAL HUMERUS FRACTURE;  Surgeon: Mable Paris, MD;  Location: [facility];  Service: Orthopedics;  Laterality: Right;   ORIF PROXIMAL HUMERUS FRACTURE Right 04/28/2014   DR CHANDLER   PROCTOSCOPY N/A 06/16/2020   Procedure: RIGID PROCTOSCOPY;  Surgeon: Karie Soda, MD;  Location: [facility];  Service: General;  Laterality: N/A;   TUBAL LIGATION       A IV Location/Drains/Wounds Patient Lines/Drains/Airways Status     Active Line/Drains/Airways     Name Placement date Placement time Site Days   Peripheral IV 04/09/23 20 G Left Antecubital 04/09/23  2202  Antecubital  1   Incision - 5 Ports Abdomen Right;Lateral Right;Medial Mid Left Left;Lateral 06/16/20  1345  -- 1028  Intake/Output Last 24 hours  Intake/Output Summary (Last 24 hours) at 04/10/2023 1310 Last data filed at 04/09/2023 2310 Gross per 24 hour  Intake 1000 ml  Output --  Net 1000 ml    Labs/Imaging Results for orders placed or performed during the hospital encounter of 04/09/23 (from the past 48 hour(s))  CBC with Differential     Status: Abnormal   Collection Time: 04/09/23  8:00 PM  Result Value Ref Range   WBC 6.4 4.0 - 10.5 K/uL   RBC 5.37 (H) 3.87 - 5.11 MIL/uL   Hemoglobin 17.2 (H) 12.0 - 15.0 g/dL   HCT 16.1 (H) 09.6 - 04.5 %   MCV 96.5  80.0 - 100.0 fL   MCH 32.0 26.0 - 34.0 pg   MCHC 33.2 30.0 - 36.0 g/dL   RDW 40.9 81.1 - 91.4 %   Platelets PLATELET CLUMPS NOTED ON SMEAR, UNABLE TO ESTIMATE 150 - 400 K/uL   nRBC 0.0 0.0 - 0.2 %   Neutrophils Relative % 61 %   Neutro Abs 4.0 1.7 - 7.7 K/uL   Lymphocytes Relative 35 %   Lymphs Abs 2.3 0.7 - 4.0 K/uL   Monocytes Relative 2 %   Monocytes Absolute 0.1 0.1 - 1.0 K/uL   Eosinophils Relative 0 %   Eosinophils Absolute 0.0 0.0 - 0.5 K/uL   Basophils Relative 1 %   Basophils Absolute 0.0 0.0 - 0.1 K/uL   Immature Granulocytes 1 %   Abs Immature Granulocytes 0.06 0.00 - 0.07 K/uL    Comment: Performed at [facility], [address] [place], [place] [address]  Comprehensive metabolic panel     Status: Abnormal   Collection Time: 04/09/23  8:00 PM  Result Value Ref Range   Sodium 139 135 - 145 mmol/L   Potassium 5.1 3.5 - 5.1 mmol/L    Comment: HEMOLYSIS AT THIS LEVEL MAY AFFECT RESULT   Chloride 104 98 - 111 mmol/L   CO2 21 (L) 22 - 32 mmol/L   Glucose, Bld 125 (H) 70 - 99 mg/dL    Comment: Glucose reference range applies only to samples taken after fasting for at least 8 hours.   BUN 9 6 - 20 mg/dL   Creatinine, Ser 6.21 0.44 - 1.00 mg/dL   Calcium 8.9 8.9 - 30.8 mg/dL   Total Protein 7.6 6.5 - 8.1 g/dL   Albumin 4.7 3.5 - 5.0 g/dL   AST 40 15 - 41 U/L    Comment: HEMOLYSIS AT THIS LEVEL MAY AFFECT RESULT   ALT 22 0 - 44 U/L    Comment: HEMOLYSIS AT THIS LEVEL MAY AFFECT RESULT   Alkaline Phosphatase 85 38 - 126 U/L   Total Bilirubin 1.3 (H) 0.3 - 1.2 mg/dL    Comment: HEMOLYSIS AT THIS LEVEL MAY AFFECT RESULT   GFR, Estimated >60 >60 mL/min    Comment: (NOTE) Calculated using the CKD-EPI Creatinine Equation (2021)    Anion gap 14 5 - 15    Comment: Performed at [facility], [address], [place], [place] [address]  Troponin I (High Sensitivity)     Status: None   Collection Time: 04/09/23  8:00 PM  Result Value Ref Range   Troponin I (High  Sensitivity) 8 <18 ng/L    Comment: (NOTE) Elevated high sensitivity troponin I (hsTnI) values and significant  changes across serial measurements may suggest ACS but many other  chronic and acute conditions are known to elevate hsTnI results.  Refer to the "Links" section for chest pain algorithms and additional  guidance. Performed at [facility], [address] [place], [place] [address]   Ethanol     Status: Abnormal   Collection Time: 04/09/23  8:00 PM  Result Value Ref Range   Alcohol, Ethyl (B) 309 (HH) <10 mg/dL    Comment: CRITICAL RESULT CALLED TO, READ BACK BY AND VERIFIED WITH T. WALSTON,RN. 2145 04/09/23. LPAIT (NOTE) Lowest detectable limit for serum alcohol is 10 mg/dL.  For medical purposes only. Performed at [facility], [address], [place], [place] [address]   D-dimer, quantitative     Status: Abnormal   Collection Time: 04/09/23  8:00 PM  Result Value Ref Range   D-Dimer, Quant 1.14 (H) 0.00 - 0.50 ug/mL-FEU    Comment: (NOTE) At the manufacturer cut-off value of 0.5 g/mL FEU, this assay has a negative predictive value of 95-100%.This assay is intended for use in conjunction with a clinical pretest probability (PTP) assessment model to exclude pulmonary embolism (PE) and deep venous thrombosis (DVT) in outpatients suspected of PE or DVT. Results should be correlated with clinical presentation. Performed at [facility], [address] [place], [place] [address]   Brain natriuretic peptide     Status: None   Collection Time: 04/09/23  8:00 PM  Result Value Ref Range   B Natriuretic Peptide 72.9 0.0 - 100.0 pg/mL    Comment: Performed at [facility], [address], [place], [place] [address]  CBC     Status: None   Collection Time: 04/10/23  2:00 AM  Result Value Ref Range   WBC 8.2 4.0 - 10.5 K/uL   RBC 4.60 3.87 - 5.11 MIL/uL   Hemoglobin 14.8 12.0 - 15.0 g/dL   HCT 29.5 62.1 - 30.8 %   MCV 95.0 80.0 - 100.0 fL   MCH  32.2 26.0 - 34.0 pg   MCHC 33.9 30.0 - 36.0 g/dL   RDW 65.7 84.6 - 96.2 %   Platelets 208 150 - 400 K/uL   nRBC 0.0 0.0 - 0.2 %    Comment: Performed at [facility], [address] [place], [place] [address]  Comprehensive metabolic panel     Status: Abnormal   Collection Time: 04/10/23  2:00 AM  Result Value Ref Range   Sodium 134 (L) 135 - 145 mmol/L   Potassium 3.6 3.5 - 5.1 mmol/L   Chloride 102 98 - 111 mmol/L   CO2 16 (L) 22 - 32 mmol/L   Glucose, Bld 133 (H) 70 - 99 mg/dL    Comment: Glucose reference range applies only to samples taken after fasting for at least 8 hours.   BUN 5 (L) 6 - 20 mg/dL   Creatinine, Ser 1.32 0.44 - 1.00 mg/dL   Calcium 8.3 (L) 8.9 - 10.3 mg/dL   Total Protein 6.8 6.5 - 8.1 g/dL   Albumin 3.7 3.5 - 5.0 g/dL   AST 25 15 - 41 U/L   ALT 22 0 - 44 U/L   Alkaline Phosphatase 77 38 - 126 U/L   Total Bilirubin 0.4 0.3 - 1.2 mg/dL   GFR, Estimated >44 >01 mL/min    Comment: (NOTE) Calculated using the CKD-EPI Creatinine Equation (2021)    Anion gap 16 (H) 5 - 15    Comment: Performed at [facility], [address], [place], [place] [address]  Magnesium     Status: None  Collection Time: 04/10/23  2:00 AM  Result Value Ref Range   Magnesium 1.9 1.7 - 2.4 mg/dL    Comment: Performed at [facility], [address] [place], [place] [address]  Phosphorus     Status: None   Collection Time: 04/10/23  2:00 AM  Result Value Ref Range   Phosphorus 2.5 2.5 - 4.6 mg/dL    Comment: Performed at [facility], [address] [place], [place] [address]   CT Angio Chest PE W and/or Wo Contrast  Result Date: 04/09/2023 CLINICAL DATA:  High probability for PE EXAM: CT ANGIOGRAPHY CHEST WITH CONTRAST TECHNIQUE: Multidetector CT imaging of the chest was performed using the standard protocol during bolus administration of intravenous contrast. Multiplanar CT image reconstructions and MIPs were obtained to evaluate the vascular anatomy.  RADIATION DOSE REDUCTION: This exam was performed according to the departmental dose-optimization program which includes automated exposure control, adjustment of the mA and/or kV according to patient size and/or use of iterative reconstruction technique. CONTRAST:  75mL OMNIPAQUE IOHEXOL 350 MG/ML SOLN COMPARISON:  CT chest abdomen and pelvis 10/05/2013 FINDINGS: Cardiovascular: Satisfactory opacification of the pulmonary arteries to the segmental level. No evidence of pulmonary embolism. Normal heart size. No pericardial effusion. Mediastinum/Nodes: No enlarged mediastinal, hilar, or axillary lymph nodes. Thyroid gland, trachea, and esophagus demonstrate no significant findings. Lungs/Pleura: Minimal emphysematous changes are present. There is scarring in the lung apices. There is a 4 mm nodule in the right lower lobe image 7/109 which is new from prior. There is no lung consolidation, pleural effusion or pneumothorax. Upper Abdomen: No acute abnormality. Musculoskeletal: Orthopedic pins are seen in the right humeral head. No acute fractures are seen. Review of the MIP images confirms the above findings. IMPRESSION: 1. No evidence for pulmonary embolism or other acute cardiopulmonary process. 2. Right solid pulmonary nodule measuring 4 mm. Per Fleischner Society Guidelines, no routine follow-up imaging is recommended. These guidelines do not apply to immunocompromised patients and patients with cancer. Follow up in patients with significant comorbidities as clinically warranted. For lung cancer screening, adhere to Lung-RADS guidelines. Reference: Radiology. 2017; 284(1):228-43. Electronically Signed   By: Darliss Cheney M.D.   On: 04/09/2023 23:25   DG Chest 2 View  Result Date: 04/09/2023 CLINICAL DATA:  Shortness of breath. Patient reports symptoms for 3 weeks. EXAM: CHEST - 2 VIEW COMPARISON:  Radiograph and CT 01/04/2016 FINDINGS: The cardiomediastinal contours are normal. Subsegmental linear atelectasis in  the right middle low. Pulmonary vasculature is normal. No consolidation, pleural effusion, or pneumothorax. Right proximal humeral hardware partially included. At least 2 midthoracic compression deformities, age indeterminate but new from 2017. IMPRESSION: Subsegmental atelectasis in the right mid lung. Electronically Signed   By: Narda Rutherford M.D.   On: 04/09/2023 21:17    Pending Labs Unresulted Labs (From admission, onward)     Start     Ordered   04/16/23 0500  Creatinine, serum  (enoxaparin (LOVENOX)    CrCl >/= 30 ml/min)  Weekly,   R     Comments: while on enoxaparin therapy    04/09/23 2358   04/11/23 0500  HIV Antibody (routine testing w rflx)  (HIV Antibody (Routine testing w reflex) panel)  Tomorrow morning,   R        04/10/23 0151   04/10/23 0500  Erythropoietin  Tomorrow morning,   R        04/10/23 0148  Vitals/Pain Today's Vitals   04/10/23 1030 04/10/23 1045 04/10/23 1100 04/10/23 1220  BP: 112/73  109/69 123/77  Pulse: (!) 108  (!) 104 (!) 108  Resp: 15 15  18   Temp:    98.9 F (37.2 C)  TempSrc:    Oral  SpO2: 97%  96% 95%  Weight:      Height:      PainSc:    Asleep    Isolation Precautions No active isolations  Medications Medications  enoxaparin (LOVENOX) injection 40 mg (40 mg Subcutaneous Given 04/10/23 0931)  LORazepam (ATIVAN) tablet 1-4 mg ( Oral Not Given 04/10/23 1105)    Or  LORazepam (ATIVAN) injection 1-4 mg ( Intravenous See Alternative 04/10/23 1105)  thiamine (VITAMIN B1) tablet 100 mg (100 mg Oral Given 04/10/23 0940)    Or  thiamine (VITAMIN B1) injection 100 mg ( Intravenous See Alternative 04/10/23 0940)  folic acid (FOLVITE) tablet 1 mg (1 mg Oral Given 04/10/23 0933)  multivitamin with minerals tablet 1 tablet (1 tablet Oral Given 04/10/23 0939)  escitalopram (LEXAPRO) tablet 10 mg (10 mg Oral Given 04/10/23 0932)  gabapentin (NEURONTIN) capsule 200 mg (0 mg Oral Hold 04/10/23 0519)  hydrOXYzine (ATARAX) tablet 25 mg (has no  administration in time range)  lamoTRIgine (LAMICTAL) tablet 25 mg (25 mg Oral Given 04/10/23 0935)  nicotine (NICODERM CQ - dosed in mg/24 hr) patch 7 mg (7 mg Transdermal Patch Applied 04/10/23 0939)  SUMAtriptan (IMITREX) tablet 25 mg (has no administration in time range)  traZODone (DESYREL) tablet 50 mg (has no administration in time range)  0.9 %  sodium chloride infusion ( Intravenous Rate/Dose Verify 04/10/23 1142)  ipratropium-albuterol (DUONEB) 0.5-2.5 (3) MG/3ML nebulizer solution 3 mL (3 mLs Nebulization Given 04/10/23 1141)  methylPREDNISolone sodium succinate (SOLU-MEDROL) 125 mg/2 mL injection 80 mg (80 mg Intravenous Given 04/10/23 0938)  azithromycin (ZITHROMAX) tablet 500 mg (500 mg Oral Given 04/10/23 0018)    Followed by  azithromycin (ZITHROMAX) tablet 250 mg (has no administration in time range)  labetalol (NORMODYNE) injection 5 mg (5 mg Intravenous Given 04/10/23 0933)  loperamide (IMODIUM) capsule 2-4 mg (has no administration in time range)  ondansetron (ZOFRAN-ODT) disintegrating tablet 4 mg (has no administration in time range)  chlordiazePOXIDE (LIBRIUM) capsule 25 mg (25 mg Oral Given 04/10/23 0931)    Followed by  chlordiazePOXIDE (LIBRIUM) capsule 25 mg (has no administration in time range)    Followed by  chlordiazePOXIDE (LIBRIUM) capsule 25 mg (has no administration in time range)    Followed by  chlordiazePOXIDE (LIBRIUM) capsule 25 mg (has no administration in time range)  lactated ringers bolus 1,000 mL (0 mLs Intravenous Stopped 04/09/23 2310)  acetaminophen (TYLENOL) tablet 1,000 mg (1,000 mg Oral Given 04/09/23 2157)  iohexol (OMNIPAQUE) 350 MG/ML injection 75 mL (75 mLs Intravenous Contrast Given 04/09/23 2310)  ipratropium-albuterol (DUONEB) 0.5-2.5 (3) MG/3ML nebulizer solution 3 mL (3 mLs Nebulization Given 04/10/23 0017)  LORazepam (ATIVAN) injection 0.5 mg (0.5 mg Intravenous Given 04/10/23 0139)  ketorolac (TORADOL) 15 MG/ML injection 15 mg (15 mg Intravenous Given  04/10/23 0138)  diphenhydrAMINE (BENADRYL) injection 12.5 mg (12.5 mg Intravenous Given 04/10/23 0139)  metoCLOPramide (REGLAN) injection 5 mg (5 mg Intravenous Given 04/10/23 0148)  potassium chloride SA (KLOR-CON M) CR tablet 40 mEq (40 mEq Oral Given 04/10/23 0939)    Mobility walks        R Recommendations: See Admitting Provider Note  Report given to:   Additional Notes

## 2023-04-10 NOTE — Progress Notes (Signed)
  Progress Note   Patient: [name] ZOX:[id number] DOB: 1966/05/30 DOA: 04/09/2023     0 DOS: the patient was seen and examined on 04/10/2023   Brief hospital course: 57 y.o. female with medical history significant for asthma, chronic anxiety, COPD, current tobacco and vape user, alcohol abuse, who presented to [facility] ED from home due to progressive shortness of breath for the past 3 weeks.  Pt found to have copd exacerbation.   Pt was also found to have an ETOH level of 309, also admitted for ETOH withdrawals  Assessment and Plan: Acute asthma/COPD exacerbation suspect exacerbated from ongoing vaping and use of tobacco. Pt is continued on DuoNebs every 4 hours, IV Solu-Medrol 40 mg twice daily with taper Z-Pak for its anti-inflammatory effects in COPD exacerbation Early mobilization as tolerated   Alcohol abuse with intoxication Alcohol level 309 CIWA protocol in place Multivitamins, thiamine and folic acid supplements CIWA up to 12 this AM.  Have added librium taper with improvement in CIWA   Current tobacco and vape user Polysubstance cessation counseling done at bedside TOC consulted to provide resources for cessation   Erythrocytosis, unclear etiology Unclear if secondary to volume contraction, tobacco use Hemoglobin on presentation 17.2K with hematocrit of 51.8K  Repeat Hgb normal at 14  Dizziness, unclear if from acute alcohol intoxication Presenting with alcohol level 309 Continue CIWA as needed Visibly shaking and diaphoretic this AM   Mild non-anion gap metabolic acidosis Serum bicarb 21, anion gap 9 Given limited quantity of gentle IVF -Recheck cmp in AM   Isolated elevated T. Bili  In the setting of alcohol abuse Monitor LFTs.   Chronic anxiety Resume home regimen   Headache, frontal Received headache cocktail in the ED IV benadryl, IV Toradol, IV Reglan x 1 dose    Subjective: States breathing is now much better. Feels very shaky this  morning  Physical Exam: Vitals:   04/10/23 1220 04/10/23 1441 04/10/23 1525 04/10/23 1532  BP: 123/77 108/63 111/70   Pulse: (!) 108 (!) 106 (!) 112 (!) 114  Resp: 18 15 18 18   Temp: 98.9 F (37.2 C) 98.6 F (37 C) 98.8 F (37.1 C)   TempSrc: Oral Oral Oral   SpO2: 95% 96% 95% 95%  Weight:      Height:       General exam: Awake, laying in bed, in nad Respiratory system: Normal respiratory effort, no wheezing Cardiovascular system: regular rate, s1, s2 Gastrointestinal system: Soft, nondistended, positive BS Central nervous system: CN2-12 grossly intact, strength intact Extremities: Perfused, no clubbing Skin: Normal skin turgor, no notable skin lesions seen Psychiatry: Mood normal // no visual hallucinations   Data Reviewed:  Labs reviewed: na 134, K 3.6, Cr 0.70, WBC 8.2, hgb 14.8, Plts 208  Family Communication: Pt in room, family not at bedside  Disposition: Status is: Observation The patient will require care spanning > 2 midnights and should be moved to inpatient because: Severity of illness  Planned Discharge Destination: Home    Author: Rickey Barbara, MD 04/10/2023 4:43 PM  For on call review [email or web address].

## 2023-04-10 NOTE — H&P (Incomplete)
History and Physical  CAMIA SCHAEDLER NUU:[id number] DOB: May 02, 1966 DOA: 04/09/2023  Referring physician: Dr. Karene Fry, EDP  PCP: Jerl Mina, MD  Outpatient Specialists: [facility] outpatient pulmonary. Patient coming from: Home.  Chief Complaint: Progressive shortness of breath.  HPI: DAWNETTE KROGH is a 57 y.o. female with medical history significant for asthma, chronic anxiety, COPD, current tobacco and vape user, alcohol abuse, who presented to [facility] ED from home due to progressive shortness of breath for the past 3 weeks.  Symptoms were worse today.  Also reports dizziness after waking up around 6 AM this morning, resolving spontaneously and recurring with movement.  Endorses a frontal headache at this time that radiates to the back of her head.  In the ED, noted to have audible wheezing.  Chest x-ray was nonacute.  Also incidentally noted to have alcohol intoxication with level of 309 and erythrocytosis with Hg 17.2 K.  Denies vomiting or diarrhea.  The patient received DuoNebs, IV Solu-Medrol, IV magnesium, 1 L IV fluid bolus LR x 1.  Due to persistent symptomatology, TRH, hospitalist service, was asked to admit.  The patient was admitted at [facility] telemetry medical unit as observation status for an acute asthma/COPD exacerbation.  CIWA protocol was initiated.  ED Course: Tmax 98.1.  BP 140/83, pulse 113, respiration rate 20, saturation 95% on room air.  Lab studies notable for serum bicarb 21, glucose 125.  T. bili 1.3.  Hemoglobin 17.2.  Review of Systems: Review of systems as noted in the HPI. All other systems reviewed and are negative.   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   History of anemia    Severe episode of recurrent major depressive disorder, without psychotic features (HCC) 01/07/2020   Sleep apnea    mild   SUI (stress urinary incontinence, female)    Past Surgical History:  Procedure Laterality Date   BLADDER SUSPENSION N/A 02/13/2013   Procedure: [facility] SLING;  Surgeon: Anner Crete, MD;  Location: [facility];  Service: Urology;  Laterality: N/A;   CESAREAN SECTION  12-23-2004    W/ LEFT TUBAL LIGATION AND RIGHT SALPINGECTOMY   CESAREAN SECTION  1995   CYSTOSCOPY WITH STENT PLACEMENT Bilateral 06/16/2020   Procedure: CYSTOSCOPY WITH FIREFLY INJECTION;  Surgeon: Crista Elliot, MD;  Location: [facility];  Service: Urology;  Laterality: Bilateral;   IR RADIOLOGIST EVAL & MGMT  04/21/2020   IR RADIOLOGIST EVAL & MGMT  05/12/2020   ORIF HUMERUS FRACTURE Right 04/28/2014   Procedure: OPEN REDUCTION INTERNAL FIXATION (ORIF) PROXIMAL HUMERUS FRACTURE;  Surgeon: Mable Paris, MD;  Location: [facility] OR;  Service: Orthopedics;  Laterality: Right;   ORIF PROXIMAL HUMERUS FRACTURE Right 04/28/2014   DR CHANDLER   PROCTOSCOPY N/A 06/16/2020   Procedure: RIGID PROCTOSCOPY;  Surgeon: Karie Soda, MD;  Location: [facility];  Service: General;  Laterality: N/A;   TUBAL LIGATION      Social History:  reports that she quit smoking about 3 years ago. Her smoking use included cigarettes. She has a 5.00 pack-year smoking history. She  has never used smokeless tobacco. She reports current alcohol use of about 2.0 standard drinks of alcohol per week. She reports that she does not use drugs.   Allergies  Allergen Reactions   Codeine Nausea And Vomiting and Other (See Comments)    dizziness   Hydrocodone Other (See Comments)    Dizzy; Tolerates oxycodone fine    Family History  Adopted: Yes      Prior to Admission medications   Medication Sig Start Date End Date Taking?  Authorizing Provider  acamprosate (CAMPRAL) 333 MG tablet Take 2 tablets (666 mg total) by mouth 3 (three) times daily with meals. 12/30/22   Leevy-Johnson, Brooke A, NP  escitalopram (LEXAPRO) 10 MG tablet Take 1 tablet (10 mg total) by mouth daily. 12/31/22   Leevy-Johnson, Lina Sar, NP  gabapentin (NEURONTIN) 100 MG capsule Take 2 capsules (200 mg total) by mouth every 8 (eight) hours. 12/30/22   Leevy-Johnson, Lina Sar, NP  hydrOXYzine (ATARAX) 25 MG tablet Take 1 tablet (25 mg total) by mouth every 6 (six) hours as needed (anxiety/agitation or CIWA < or = 10). 12/30/22   Leevy-Johnson, Lina Sar, NP  lamoTRIgine (LAMICTAL) 25 MG tablet Take 1 tablet (25 mg total) by mouth daily. 12/31/22   Leevy-Johnson, Brooke A, NP  nicotine (NICODERM CQ - DOSED IN MG/24 HR) 7 mg/24hr patch Place 1 patch (7 mg total) onto the skin daily. 12/31/22   Leevy-Johnson, Lina Sar, NP  propranolol (INDERAL) 10 MG tablet Take 1 tablet (10 mg total) by mouth every 8 (eight) hours. 12/30/22   Leevy-Johnson, Lina Sar, NP  SUMAtriptan (IMITREX) 25 MG tablet Take 1 tablet (25 mg total) by mouth every 2 (two) hours as needed for migraine or headache. May repeat in 2 hours if headache persists or recurs. 12/30/22   Leevy-Johnson, Lina Sar, NP  thiamine (VITAMIN B-1) 100 MG tablet Take 1 tablet (100 mg total) by mouth daily. 12/31/22   Leevy-Johnson, Lina Sar, NP  traZODone (DESYREL) 50 MG tablet Take 1 tablet (50 mg total) by mouth at bedtime as needed for sleep. 12/30/22   Leevy-Johnson, Lina Sar, NP    Physical Exam: BP (!) 148/83   Pulse (!) 113   Temp 98.1 F (36.7 C)   Resp 20   Ht 5\' 5"  (1.651 m)   Wt 68 kg   SpO2 95%   BMI 24.96 kg/m   General: 57 y.o. year-old female well developed well nourished in no acute distress.  Alert and oriented x3. Cardiovascular: Tachycardia with no rubs or gallops.  No thyromegaly or JVD noted.  No lower extremity edema. 2/4 pulses in all 4 extremities. Respiratory: Mild wheezing  bilaterally.  Good inspiratory effort. Abdomen: Soft nontender nondistended with normal bowel sounds x4 quadrants. Muskuloskeletal: No cyanosis, clubbing or edema noted bilaterally Neuro: CN II-XII intact, strength, sensation, reflexes Skin: No ulcerative lesions noted or rashes Psychiatry: Judgement and insight appear normal. Mood is appropriate for condition and setting          Labs on Admission:  Basic Metabolic Panel: Recent Labs  Lab 04/09/23 2000  NA 139  K 5.1  CL 104  CO2 21*  GLUCOSE 125*  BUN 9  CREATININE 0.91  CALCIUM 8.9   Liver Function Tests: Recent Labs  Lab 04/09/23 2000  AST 40  ALT 22  ALKPHOS 85  BILITOT 1.3*  PROT 7.6  ALBUMIN 4.7   No results for input(s): "LIPASE", "AMYLASE" in the last 168 hours. No results for input(s): "AMMONIA"  in the last 168 hours. CBC: Recent Labs  Lab 04/09/23 2000  WBC 6.4  NEUTROABS 4.0  HGB 17.2*  HCT 51.8*  MCV 96.5  PLT PLATELET CLUMPS NOTED ON SMEAR, UNABLE TO ESTIMATE   Cardiac Enzymes: No results for input(s): "CKTOTAL", "CKMB", "CKMBINDEX", "TROPONINI" in the last 168 hours.  BNP (last 3 results) Recent Labs    04/09/23 2000  BNP 72.9    ProBNP (last 3 results) No results for input(s): "PROBNP" in the last 8760 hours.  CBG: No results for input(s): "GLUCAP" in the last 168 hours.  Radiological Exams on Admission: CT Angio Chest PE W and/or Wo Contrast  Result Date: 04/09/2023 CLINICAL DATA:  High probability for PE EXAM: CT ANGIOGRAPHY CHEST WITH CONTRAST TECHNIQUE: Multidetector CT imaging of the chest was performed using the standard protocol during bolus administration of intravenous contrast. Multiplanar CT image reconstructions and MIPs were obtained to evaluate the vascular anatomy. RADIATION DOSE REDUCTION: This exam was performed according to the departmental dose-optimization program which includes automated exposure control, adjustment of the mA and/or kV according to patient size  and/or use of iterative reconstruction technique. CONTRAST:  75mL OMNIPAQUE IOHEXOL 350 MG/ML SOLN COMPARISON:  CT chest abdomen and pelvis 10/05/2013 FINDINGS: Cardiovascular: Satisfactory opacification of the pulmonary arteries to the segmental level. No evidence of pulmonary embolism. Normal heart size. No pericardial effusion. Mediastinum/Nodes: No enlarged mediastinal, hilar, or axillary lymph nodes. Thyroid gland, trachea, and esophagus demonstrate no significant findings. Lungs/Pleura: Minimal emphysematous changes are present. There is scarring in the lung apices. There is a 4 mm nodule in the right lower lobe image 7/109 which is new from prior. There is no lung consolidation, pleural effusion or pneumothorax. Upper Abdomen: No acute abnormality. Musculoskeletal: Orthopedic pins are seen in the right humeral head. No acute fractures are seen. Review of the MIP images confirms the above findings. IMPRESSION: 1. No evidence for pulmonary embolism or other acute cardiopulmonary process. 2. Right solid pulmonary nodule measuring 4 mm. Per Fleischner Society Guidelines, no routine follow-up imaging is recommended. These guidelines do not apply to immunocompromised patients and patients with cancer. Follow up in patients with significant comorbidities as clinically warranted. For lung cancer screening, adhere to Lung-RADS guidelines. Reference: Radiology. 2017; 284(1):228-43. Electronically Signed   By: Darliss Cheney M.D.   On: 04/09/2023 23:25   DG Chest 2 View  Result Date: 04/09/2023 CLINICAL DATA:  Shortness of breath. Patient reports symptoms for 3 weeks. EXAM: CHEST - 2 VIEW COMPARISON:  Radiograph and CT 01/04/2016 FINDINGS: The cardiomediastinal contours are normal. Subsegmental linear atelectasis in the right middle low. Pulmonary vasculature is normal. No consolidation, pleural effusion, or pneumothorax. Right proximal humeral hardware partially included. At least 2 midthoracic compression  deformities, age indeterminate but new from 2017. IMPRESSION: Subsegmental atelectasis in the right mid lung. Electronically Signed   By: Narda Rutherford M.D.   On: 04/09/2023 21:17    EKG: I independently viewed the EKG done and my findings are as followed: Sinus tachycardia rate of 107.  Nonspecific ST-T changes.  QTc 441.  Assessment/Plan Present on Admission:  Asthma exacerbation  Principal Problem:   Asthma exacerbation  Acute asthma/COPD exacerbation suspect exacerbated from ongoing vaping and use of tobacco. Continue DuoNebs every 4 hours, IV Solu-Medrol 40 mg twice daily with taper Z-Pak for its anti-inflammatory effects in COPD exacerbation Early mobilization as tolerated  Alcohol abuse with intoxication Alcohol level 309 CIWA protocol in place Multivitamins, thiamine and folic acid supplements TOC consulted to assist  with providing resources for complete alcohol cessation  Current tobacco and vape user Polysubstance cessation counseling done at bedside [facility] consulted to provide resources for cessation  Erythrocytosis, unclear etiology Unclear if secondary to volume contraction, tobacco use Hemoglobin on presentation 17.2K with hematocrit of 51.8K Repeat CBC in the morning after IV fluid hydration Utilize pharmacological DVT prophylaxis while hospitalized Follow serum Epo  Dizziness, unclear if from acute alcohol intoxication Presenting with alcohol level 309 Reassess in the morning with PT OT evaluation Last known well was prior to 6 AM on 04/09/23  Mild non-anion gap metabolic acidosis Serum bicarb 21, anion gap 9 Gentle IV fluid hydration NS at 50 cc/h x 1 day  Isolated elevated T. bili In the setting of alcohol abuse Monitor LFTs.  Chronic anxiety Resume home regimen  Headache, frontal Received headache cocktail in the ED IV benadryl, IV Toradol, IV Reglan x 1 dose   DVT prophylaxis: Subcu Lovenox daily  Code Status: Full code  Family  Communication: None at bedside  Disposition Plan: Admitted to telemetry medical unit  Consults called: PT/OT  Admission status: Observation status.   Status is: Observation    Darlin Drop MD [facility] Pager [phone]  If 7PM-7AM, please contact night-coverage [email or web address] Password [email or web address]  04/10/2023, 12:02 AM

## 2023-04-10 NOTE — Plan of Care (Signed)

## 2023-04-10 NOTE — Hospital Course (Signed)
[name] is a 57 y.o. female with a history of asthma, anxiety, COPD, tobacco use, alcohol abuse. Patient presented secondary to progressively worsening shortness of breath and found to have evidence of a COPD vs asthma exacerbation.  Patient started on DuoNeb breathing treatments in addition to Solu-Medrol with improvement of symptoms.  Hospitalization complicated by development of alcohol withdrawal symptoms requiring benzodiazepine treatment. Patient improved with continued treatment and was stable for discharge. Patient motivated to quit alcohol and has follow-up with her PCP already.

## 2023-04-10 NOTE — Evaluation (Signed)
Occupational Therapy Evaluation Patient Details Name: [name] MRN: [id number] DOB: [birth year] Today's Date: 04/10/2023   History of Present Illness Patient is a 57 y.o. female who presented to [facility] ED from home due to progressive shortness of breath for the past 3 weeks.  Symptoms were worse today.  Also reports dizziness after waking up around 6 AM this morning, resolving spontaneously and recurring with movement. PMH significant for asthma, chronic anxiety, COPD, current tobacco and vape user, alcohol abuse.   Clinical Impression   Patient admitted for the diagnosis above.  PTA she lives alone, and needed no assist with ADL,iADL or mobility.  Patient sleeping soundly upon entering and needing a minute to reorient.  Patient overall needing generalized supervision for in room mobility and ADL completion.  OT is indicated in the acute setting to address deficits and assist with eventual return home.  No post acute OT is anticipated.        Recommendations for follow up therapy are one component of a multi-disciplinary discharge planning process, led by the attending physician.  Recommendations may be updated based on patient status, additional functional criteria and insurance authorization.   Assistance Recommended at Discharge PRN  Patient can return home with the following Assist for transportation    Functional Status Assessment  Patient has had a recent decline in their functional status and demonstrates the ability to make significant improvements in function in a reasonable and predictable amount of time.  Equipment Recommendations  None recommended by OT    Recommendations for Other Services       Precautions / Restrictions Precautions Precautions: Fall Restrictions Weight Bearing Restrictions: No  ETOH    Mobility Bed Mobility Overal bed mobility: Modified Independent               Patient Response: Cooperative  Transfers Overall transfer level: Needs  assistance Equipment used: None Transfers: Sit to/from Stand, Bed to chair/wheelchair/BSC Sit to Stand: Supervision     Step pivot transfers: Supervision            Balance Overall balance assessment: Needs assistance Sitting-balance support: Feet supported Sitting balance-Leahy Scale: Good     Standing balance support: No upper extremity supported Standing balance-Leahy Scale: Fair                             ADL either performed or assessed with clinical judgement   ADL       Grooming: Supervision/safety;Standing               Lower Body Dressing: Supervision/safety;Sit to/from stand   Toilet Transfer: Retail banker;Ambulation                   Vision Baseline Vision/History: 1 Wears glasses Patient Visual Report: No change from baseline       Perception     Praxis      Pertinent Vitals/Pain Pain Assessment Pain Assessment: Faces Faces Pain Scale: Hurts a little bit Pain Location: headache Pain Descriptors / Indicators: Tightness Pain Intervention(s): Monitored during session     Hand Dominance Right   Extremity/Trunk Assessment Upper Extremity Assessment Upper Extremity Assessment: Overall WFL for tasks assessed   Lower Extremity Assessment Lower Extremity Assessment: Defer to PT evaluation   Cervical / Trunk Assessment Cervical / Trunk Assessment: Normal   Communication Communication Communication: No difficulties   Cognition Arousal/Alertness: Awake/alert Behavior During Therapy: Flat affect Overall Cognitive Status: Within Functional Limits for  tasks assessed                                       General Comments   HR in mid 90's     Exercises     Shoulder Instructions      Home Living Family/patient expects to be discharged to:: Private residence Living Arrangements: Alone Available Help at Discharge: Family;Available PRN/intermittently Type of Home: House Home  Access: Stairs to enter [employer] of Steps: 3 Entrance Stairs-Rails: Left Home Layout: One level     Bathroom Shower/Tub: Producer, television/film/video: Standard Bathroom Accessibility: Yes   Home Equipment: None          Prior Functioning/Environment Prior Level of Function : Independent/Modified Independent;Driving;Working/employed                        OT Problem List: Decreased activity tolerance;Impaired balance (sitting and/or standing)      OT Treatment/Interventions: Self-care/ADL training;Therapeutic activities;Balance training;Patient/family education    OT Goals(Current goals can be found in the care plan section) Acute Rehab OT Goals Patient Stated Goal: Return home OT Goal Formulation: With patient Potential to Achieve Goals: Good ADL Goals Pt Will Perform Grooming: Independently;standing Pt Will Perform Lower Body Dressing: Independently;sit to/from stand Pt Will Transfer to Toilet: Independently;ambulating;regular height toilet  OT Frequency: Min 2X/week    Co-evaluation              AM-PAC OT "6 Clicks" Daily Activity     Outcome Measure Help from another person eating meals?: None Help from another person taking care of personal grooming?: A Little Help from another person toileting, which includes using toliet, bedpan, or urinal?: A Little Help from another person bathing (including washing, rinsing, drying)?: A Little Help from another person to put on and taking off regular upper body clothing?: None Help from another person to put on and taking off regular lower body clothing?: A Little 6 Click Score: 20   End of Session Equipment Utilized During Treatment: Oxygen Nurse Communication: Mobility status  Activity Tolerance: Patient tolerated treatment well Patient left: in chair;with call bell/phone within reach;with nursing/sitter in room  OT Visit Diagnosis: Unsteadiness on feet (R26.81)                Time:  1442-1500 OT Time Calculation (min): 18 min Charges:  OT General Charges $OT Visit: 1 Visit OT Evaluation $OT Eval Moderate Complexity: 1 Mod  04/10/2023  RP, OTR/L  Acute Rehabilitation Services  Office:  [phone]   Suzanna Obey 04/10/2023, 3:06 PM

## 2023-04-10 NOTE — ED Notes (Signed)
Pt in bed with eyes closed, resps even and unlabored, pt arouses easily, pt has no complaints.

## 2023-04-10 NOTE — Progress Notes (Signed)
Transition of Care [facility]) - Emergency Department Mini Assessment   Patient Details  Name: [name] MRN: [id number] Date of Birth: Apr 03, 1966  [facility]) CM/SW Contact:    Oletta Cohn, RN Phone Number: 04/10/2023, 1:57 PM   Clinical Narrative: ARRIVED VIA EMS FROM HOME ETOH ON BOARD C/O 1 WEEK HX SOB    ED Mini Assessment: What brought you to the Emergency Department? : (P) Worsening shortness of breath  Barriers to Discharge: (P) Continued Medical Work up             Patient Contact and Communications        ,          Patient states their goals for this hospitalization and ongoing recovery are:: (P) go home after getting better      Admission diagnosis:  Asthma exacerbation [J45.901] Patient Active Problem List   Diagnosis Date Noted   Asthma exacerbation 04/09/2023   Alcohol use disorder 12/26/2022   Asthma 12/26/2022   MDD (major depressive disorder), recurrent episode, severe (HCC) 12/25/2022   Depression, unspecified 01/06/2021   Alcohol use 01/06/2021   Insomnia 06/18/2020   SUI (stress urinary incontinence, female)    Hypertension    History of anemia    GERD (gastroesophageal reflux disease)    History of alcoholism (HCC) 02/25/2020   Intractable migraine with aura without status migrainosus 02/25/2020   Attention deficit hyperactivity disorder (ADHD), predominantly inattentive type 01/07/2020   GAD (generalized anxiety disorder) 01/07/2020   MDD (major depressive disorder), recurrent severe, without psychosis (HCC) 01/07/2020   Diverticulitis with abscess/fistula s/p robotic left colectomy 06/16/2020 09/06/2019   MDD (major depressive disorder) 10/07/2015   PCP:  Jerl Mina, MD Pharmacy:   [facility] - [place], [place] - [phone], [address] [place] [address] Phone: [phone] Fax: [phone]  [place] [address] [place], [place] - [address] [place] [address] Phone: [phone] Fax: [phone]  [facility] [address], [place] - [address] [place] [address] Phone: [phone] Fax: [phone]

## 2023-04-10 NOTE — ED Notes (Signed)
Md notified of pt's heart rate

## 2023-04-10 NOTE — ED Notes (Signed)
Pt in bed, pt denies pain, pt talking in full sentences, pt has some expiratory wheezes, pt has tremors in finger tips, ativan given, duo neb given.

## 2023-04-10 NOTE — ED Notes (Signed)
Pt provided with ice packs per request for neck and forehead.

## 2023-04-10 NOTE — ED Notes (Signed)
Pt in bed with eyes closed, resps even and unlabored, pt emitting a slight snoring like noise, pt arouses easily to verbal stim, pt awaits transport

## 2023-04-10 NOTE — Evaluation (Signed)
Physical Therapy Evaluation Patient Details Name: [name] MRN: [id number] DOB: 12-07-1965 Today's Date: 04/10/2023  History of Present Illness  Patient is a 57 y.o. female who presented to [facility] ED from home due to progressive shortness of breath for the past 3 weeks.  Symptoms were worse today.  Also reports dizziness after waking up around 6 AM this morning, resolving spontaneously and recurring with movement. PMH significant for asthma, chronic anxiety, COPD, current tobacco and vape user, alcohol abuse.   Clinical Impression  [name] is 57 y.o. female admitted with above HPI and diagnosis. Patient is currently limited by functional impairments below (see PT problem list). Patient lives alone and is independent at baseline. Currently she requires min guard for bed mobility and transfers with no AD. Min guard/assist provided for gait with IV pole to steady balance. Pt has mild tremors with gait and slightly wide stance with external rotation to stabilize, pt with limited dorsiflexion bil and some slap foot step pattern. Overall no overt LOB with single UE on IV pole. Pt ambulated ~ 160' with seated rest in bathroom and HR max of 130 bpm with recovery to 110's sitting. SpO2 stable on RA. Patient will benefit from continued skilled PT interventions to address impairments and progress independence with mobility. Acute PT will follow and progress as able.        Recommendations for follow up therapy are one component of a multi-disciplinary discharge planning process, led by the attending physician.  Recommendations may be updated based on patient status, additional functional criteria and insurance authorization.  Follow Up Recommendations       Assistance Recommended at Discharge Set up Supervision/Assistance  Patient can return home with the following  A little help with walking and/or transfers;Assistance with cooking/housework;Direct supervision/assist for medications  management;Assist for transportation;Help with stairs or ramp for entrance    Equipment Recommendations None recommended by PT (TBD)  Recommendations for Other Services       Functional Status Assessment Patient has had a recent decline in their functional status and demonstrates the ability to make significant improvements in function in a reasonable and predictable amount of time.     Precautions / Restrictions Precautions Precautions: Fall Restrictions Weight Bearing Restrictions: No      Mobility  Bed Mobility Overal bed mobility: Modified Independent             General bed mobility comments: use of bed features    Transfers Overall transfer level: Needs assistance Equipment used: None Transfers: Sit to/from Stand Sit to Stand: Min guard           General transfer comment: guarding for safety, slight tremors noted in Rt hand>Lt and in LE's once standing. pt able to rise/lower from EOB and toilet.    Ambulation/Gait Ambulation/Gait assistance: Min guard, Min assist Gait Distance (Feet): 160 Feet Assistive device: IV Pole Gait Pattern/deviations: Step-through pattern, Decreased stride length, Decreased dorsiflexion - left, Decreased dorsiflexion - right, Shuffle, Narrow base of support Gait velocity: decr     General Gait Details: pt with shuffled steps, limited dorsiflexion and LE's externally rotated slightly. cues for hand placement on IV pole, min assist to steady with intermittent portions of gait requiring guarding only. VSS with HR up to 130 bpm and SpO2 94% on RA.  Stairs            Wheelchair Mobility    Modified Rankin (Stroke Patients Only)       Balance Overall balance assessment: Needs  assistance Sitting-balance support: Feet supported, No upper extremity supported Sitting balance-Leahy Scale: Good     Standing balance support: Single extremity supported, During functional activity, Reliant on assistive device for  balance Standing balance-Leahy Scale: Poor                               Pertinent Vitals/Pain Pain Assessment Pain Assessment: No/denies pain    Home Living Family/patient expects to be discharged to:: Private residence Living Arrangements: Alone Available Help at Discharge: Family Type of Home: House Home Access: Stairs to enter Entrance Stairs-Rails: Left Entrance Stairs-Number of Steps: 3   Home Layout: One level Home Equipment: None      Prior Function Prior Level of Function : Independent/Modified Independent;Driving;Working/employed                     Hand Dominance   Dominant Hand: Right    Extremity/Trunk Assessment   Upper Extremity Assessment Upper Extremity Assessment: Overall WFL for tasks assessed    Lower Extremity Assessment Lower Extremity Assessment: Overall WFL for tasks assessed    Cervical / Trunk Assessment Cervical / Trunk Assessment: Normal  Communication   Communication: No difficulties  Cognition Arousal/Alertness: Awake/alert Behavior During Therapy: WFL for tasks assessed/performed Overall Cognitive Status: Within Functional Limits for tasks assessed                                          General Comments      Exercises     Assessment/Plan    PT Assessment Patient needs continued PT services  PT Problem List Decreased strength;Decreased activity tolerance;Decreased balance;Decreased mobility;Decreased knowledge of use of DME;Decreased safety awareness;Cardiopulmonary status limiting activity;Decreased knowledge of precautions       PT Treatment Interventions DME instruction;Gait training;Stair training;Functional mobility training;Therapeutic activities;Therapeutic exercise;Balance training;Neuromuscular re-education;Patient/family education    PT Goals (Current goals can be found in the Care Plan section)  Acute Rehab PT Goals Patient Stated Goal: recover and get home PT Goal  Formulation: With patient Time For Goal Achievement: 04/24/23 Potential to Achieve Goals: Good    Frequency Min 3X/week     Co-evaluation               AM-PAC PT "6 Clicks" Mobility  Outcome Measure Help needed turning from your back to your side while in a flat bed without using bedrails?: None Help needed moving from lying on your back to sitting on the side of a flat bed without using bedrails?: None Help needed moving to and from a bed to a chair (including a wheelchair)?: A Little Help needed standing up from a chair using your arms (e.g., wheelchair or bedside chair)?: A Little Help needed to walk in hospital room?: A Little Help needed climbing 3-5 steps with a railing? : A Little 6 Click Score: 20    End of Session Equipment Utilized During Treatment: Gait belt Activity Tolerance: Patient tolerated treatment well Patient left: in bed;with call bell/phone within reach Nurse Communication: Mobility status PT Visit Diagnosis: Unsteadiness on feet (R26.81);Other abnormalities of gait and mobility (R26.89);Muscle weakness (generalized) (M62.81);Difficulty in walking, not elsewhere classified (R26.2)    Time: 1610-9604 PT Time Calculation (min) (ACUTE ONLY): 20 min   Charges:   PT Evaluation $PT Eval Low Complexity: 1 Low  Wynn Maudlin, DPT Acute Rehabilitation Services Office [phone]  04/10/23 11:51 AM

## 2023-04-11 DIAGNOSIS — F10930 Alcohol use, unspecified with withdrawal, uncomplicated: Secondary | ICD-10-CM | POA: Diagnosis not present

## 2023-04-11 DIAGNOSIS — J441 Chronic obstructive pulmonary disease with (acute) exacerbation: Secondary | ICD-10-CM | POA: Diagnosis not present

## 2023-04-11 LAB — COMPREHENSIVE METABOLIC PANEL
ALT: 18 U/L (ref 0–44)
AST: 19 U/L (ref 15–41)
Albumin: 3.3 g/dL — ABNORMAL LOW (ref 3.5–5.0)
Alkaline Phosphatase: 63 U/L (ref 38–126)
Anion gap: 6 (ref 5–15)
BUN: 18 mg/dL (ref 6–20)
CO2: 23 mmol/L (ref 22–32)
Calcium: 8.6 mg/dL — ABNORMAL LOW (ref 8.9–10.3)
Chloride: 106 mmol/L (ref 98–111)
Creatinine, Ser: 0.82 mg/dL (ref 0.44–1.00)
GFR, Estimated: >60 mL/min (ref >60)
Glucose, Bld: 122 mg/dL — ABNORMAL HIGH (ref 70–99)
Potassium: 4.4 mmol/L (ref 3.5–5.1)
Sodium: 135 mmol/L (ref 135–145)
Total Bilirubin: 0.9 mg/dL (ref 0.3–1.2)
Total Protein: 5.8 g/dL — ABNORMAL LOW (ref 6.5–8.1)

## 2023-04-11 LAB — HIV ANTIBODY (ROUTINE TESTING W REFLEX): HIV Screen 4th Generation wRfx: NONREACTIVE

## 2023-04-11 LAB — CBC
HCT: 40.7 % (ref 36.0–46.0)
Hemoglobin: 13.3 g/dL (ref 12.0–15.0)
MCH: 31.7 pg (ref 26.0–34.0)
MCHC: 32.7 g/dL (ref 30.0–36.0)
MCV: 97.1 fL (ref 80.0–100.0)
Platelets: 168 10*3/uL (ref 150–400)
RBC: 4.19 MIL/uL (ref 3.87–5.11)
RDW: 13.9 % (ref 11.5–15.5)
WBC: 18.8 10*3/uL — ABNORMAL HIGH (ref 4.0–10.5)
nRBC: 0 % (ref 0.0–0.2)

## 2023-04-11 LAB — MAGNESIUM: Magnesium: 2.3 mg/dL (ref 1.7–2.4)

## 2023-04-11 MED ORDER — MONTELUKAST SODIUM 10 MG PO TABS
10.0000 mg | ORAL_TABLET | Freq: Every day | ORAL | Status: DC
  Administered 2023-04-11: 10 mg via ORAL
  Filled 2023-04-11: qty 1

## 2023-04-11 MED ORDER — MONTELUKAST SODIUM 4 MG PO CHEW: 4.0000 mg | CHEWABLE_TABLET | Freq: Every day | ORAL | Status: DC

## 2023-04-11 MED ORDER — PREDNISONE 20 MG PO TABS
40.0000 mg | ORAL_TABLET | Freq: Every day | ORAL | Status: DC
  Administered 2023-04-12: 40 mg via ORAL
  Filled 2023-04-11: qty 2

## 2023-04-11 MED ORDER — IPRATROPIUM-ALBUTEROL 0.5-2.5 (3) MG/3ML IN SOLN
3.0000 mL | Freq: Four times a day (QID) | RESPIRATORY_TRACT | Status: DC | PRN
  Administered 2023-04-11 (×2): 3 mL via RESPIRATORY_TRACT
  Filled 2023-04-11 (×2): qty 3

## 2023-04-11 NOTE — Progress Notes (Signed)
Physical Therapy Treatment Patient Details Name: [name] MRN: [id number] DOB: 04/26/1966 Today's Date: 04/11/2023   History of Present Illness Patient is a 57 y.o. female who presented to [facility] ED from home due to progressive shortness of breath for the past 3 weeks.  Symptoms were worse today.  Also reports dizziness after waking up around 6 AM this morning, resolving spontaneously and recurring with movement. PMH significant for asthma, chronic anxiety, COPD, current tobacco and vape user, alcohol abuse.    PT Comments    Pt asleep on entry, requiring light shaking to wake. Pt very reluctant to ambulate due to dizziness with movement. Pt with very congested sounding cough. Encouraged ambulation to help break up congestion so that it can be cleared. After using bathroom pt able to ambulate in the hallway with progressive stability and dissipation of dizzy feeling. Pt agreeable to sit up in recliner after ambulation. D/c plans remain appropriate.      Recommendations for follow up therapy are one component of a multi-disciplinary discharge planning process, led by the attending physician.  Recommendations may be updated based on patient status, additional functional criteria and insurance authorization.     Assistance Recommended at Discharge Set up Supervision/Assistance  Patient can return home with the following A little help with walking and/or transfers;Assistance with cooking/housework;Direct supervision/assist for medications management;Assist for transportation;Help with stairs or ramp for entrance   Equipment Recommendations  None recommended by PT (TBD)       Precautions / Restrictions Precautions Precautions: Fall Restrictions Weight Bearing Restrictions: No     Mobility  Bed Mobility Overal bed mobility: Modified Independent             General bed mobility comments: minimal bed rail use from flattened bed    Transfers Overall transfer level: Needs  assistance Equipment used: None Transfers: Sit to/from Stand, Bed to chair/wheelchair/BSC Sit to Stand: Modified independent (Device/Increase time)   Step pivot transfers: Supervision       General transfer comment: good power up and self steadying prior to ambulation to bathroom    Ambulation/Gait Ambulation/Gait assistance: Min guard, Supervision Gait Distance (Feet): 180 Feet Assistive device: None Gait Pattern/deviations: Step-through pattern, Decreased stride length, Decreased dorsiflexion - left, Decreased dorsiflexion - right, Shuffle, Narrow base of support Gait velocity: decr Gait velocity interpretation: 1.31 - 2.62 ft/sec, indicative of limited community ambulator   General Gait Details: min guard progressing to supervision with slowed, wide BoS progressing to more normal BoS         Balance Overall balance assessment: Needs assistance Sitting-balance support: Feet supported Sitting balance-Leahy Scale: Normal     Standing balance support: No upper extremity supported Standing balance-Leahy Scale: Good                              Cognition Arousal/Alertness: Awake/alert Behavior During Therapy: Flat affect Overall Cognitive Status: Within Functional Limits for tasks assessed                                             General Comments General comments (skin integrity, edema, etc.): reports sinus like headache, reluctant to participate but ultimately reports feeling better with ambulation      Pertinent Vitals/Pain Pain Assessment Faces Pain Scale: Hurts a little bit Breathing: normal Negative Vocalization: none Facial Expression: smiling or  inexpressive Body Language: relaxed Consolability: no need to console PAINAD Score: 0 Pain Location: headache Pain Descriptors / Indicators: Tightness    Home Living Family/patient expects to be discharged to:: Private residence Living Arrangements: Alone                           PT Goals (current goals can now be found in the care plan section) Acute Rehab PT Goals Patient Stated Goal: recover and get home PT Goal Formulation: With patient Time For Goal Achievement: 04/24/23 Potential to Achieve Goals: Good Progress towards PT goals: Progressing toward goals    Frequency    Min 3X/week      PT Plan Current plan remains appropriate       AM-PAC PT "6 Clicks" Mobility   Outcome Measure  Help needed turning from your back to your side while in a flat bed without using bedrails?: None Help needed moving from lying on your back to sitting on the side of a flat bed without using bedrails?: None Help needed moving to and from a bed to a chair (including a wheelchair)?: None Help needed standing up from a chair using your arms (e.g., wheelchair or bedside chair)?: None Help needed to walk in hospital room?: None Help needed climbing 3-5 steps with a railing? : A Little 6 Click Score: 23    End of Session Equipment Utilized During Treatment: Gait belt Activity Tolerance: Patient tolerated treatment well Patient left: in bed;with call bell/phone within reach Nurse Communication: Mobility status PT Visit Diagnosis: Unsteadiness on feet (R26.81);Other abnormalities of gait and mobility (R26.89);Muscle weakness (generalized) (M62.81);Difficulty in walking, not elsewhere classified (R26.2)     Time: 9147-8295 PT Time Calculation (min) (ACUTE ONLY): 17 min  Charges:  $Therapeutic Exercise: 8-22 mins                     Royann Wildasin B. Beverely Risen PT, DPT Acute Rehabilitation Services Please use secure chat or  Call Office [phone]    Elon Alas Fleet 04/11/2023, 3:00 PM

## 2023-04-11 NOTE — Plan of Care (Signed)
  Problem: Education: Goal: Knowledge of General Education information will improve Description: Including pain rating scale, medication(s)/side effects and non-pharmacologic comfort measures Outcome: Progressing   Problem: Health Behavior/Discharge Planning: Goal: Ability to manage health-related needs will improve Outcome: Progressing   Problem: Clinical Measurements: Goal: Ability to maintain clinical measurements within normal limits will improve Outcome: Progressing Goal: Will remain free from infection Outcome: Progressing Goal: Diagnostic test results will improve Outcome: Progressing Goal: Respiratory complications will improve Outcome: Progressing   Problem: Nutrition: Goal: Adequate nutrition will be maintained Outcome: Progressing   Problem: Elimination: Goal: Will not experience complications related to bowel motility Outcome: Progressing Goal: Will not experience complications related to urinary retention Outcome: Progressing   Problem: Coping: Goal: Level of anxiety will decrease Outcome: Not Progressing   Problem: Pain Managment: Goal: General experience of comfort will improve Outcome: Not Progressing

## 2023-04-11 NOTE — Progress Notes (Signed)
PROGRESS NOTE    [name]  ZOX:[id number] DOB: September 27, 1966 DOA: 04/09/2023 PCP: Jerl Mina, MD   Brief Narrative: [name] is a 57 y.o. female with a history of asthma, anxiety, COPD, tobacco use, alcohol abuse. Patient presented secondary to progressively worsening shortness of breath and found to have evidence of a COPD exacerbation.  Patient started on DuoNeb breathing treatments in addition to Solu-Medrol with improvement of symptoms.  Hospitalization complicated by development of alcohol withdrawal symptoms requiring benzodiazepine treatment.   Assessment and Plan:  COPD exacerbation No associated hypoxia. Patient started on Duonebs and Solu-medrol for treatment with improvement of symptoms. -Continue Duonebs -Switch to prednisone -Continue Singulair  Alcohol withdrawal Mild-moderate symptoms. Patient started on CIWA and Librium taper. CIWA scores up to 11 today. -Continue Librium taper and CIWA  Tobacco/vape use Noted. Cessation discussed this admission.  Polycythemia Noted on admission. Resolved.  Leukocytosis Likely secondary to steroids. No evidence of infection.  Non-anion gap metabolic acidosis Mild-moderate. Resolved.  Hyperbilirubinemia Resolved.  Anxiety -Continue Lexapro  Headache Recurrent. Frontal. No red flags.   DVT prophylaxis: Lovenox Code Status:   Code Status: Full Code Family Communication: None at bedside Disposition Plan: Discharge home likely in 1-2 days pending improvement of alcohol withdrawal symptoms   Consultants:  None  Procedures:  None  Antimicrobials: None    Subjective: Patient reports breathing has improved. Patient is having continued headache and tremors  Objective: BP 120/75 (BP Location: Right Arm)   Pulse 100   Temp 98.1 F (36.7 C)   Resp 16   Ht 5\' 5"  (1.651 m)   Wt 68 kg   SpO2 96%   BMI 24.96 kg/m   Examination:  General exam: Appears calm and comfortable Respiratory system:  Clear to auscultation. Respiratory effort normal. Cardiovascular system: S1 & S2 heard, RRR. No murmurs, rubs, gallops or clicks. Gastrointestinal system: Abdomen is nondistended, soft and nontender. No organomegaly or masses felt. Normal bowel sounds heard. Central nervous system: Alert and oriented. Bilateral hand tremor noted. Musculoskeletal: No edema. No calf tenderness Skin: No cyanosis. No rashes Psychiatry: Judgement and insight appear normal. Mood & affect appropriate.    Data Reviewed: I have personally reviewed following labs and imaging studies  CBC Lab Results  Component Value Date   WBC 18.8 (H) 04/11/2023   RBC 4.19 04/11/2023   HGB 13.3 04/11/2023   HCT 40.7 04/11/2023   MCV 97.1 04/11/2023   MCH 31.7 04/11/2023   PLT 168 04/11/2023   MCHC 32.7 04/11/2023   RDW 13.9 04/11/2023   LYMPHSABS 2.3 04/09/2023   MONOABS 0.1 04/09/2023   EOSABS 0.0 04/09/2023   BASOSABS 0.0 04/09/2023     Last metabolic panel Lab Results  Component Value Date   NA 135 04/11/2023   K 4.4 04/11/2023   CL 106 04/11/2023   CO2 23 04/11/2023   BUN 18 04/11/2023   CREATININE 0.82 04/11/2023   GLUCOSE 122 (H) 04/11/2023   GFRNONAA >60 04/11/2023   GFRAA >60 06/17/2020   CALCIUM 8.6 (L) 04/11/2023   PHOS 2.5 04/10/2023   PROT 5.8 (L) 04/11/2023   ALBUMIN 3.3 (L) 04/11/2023   BILITOT 0.9 04/11/2023   ALKPHOS 63 04/11/2023   AST 19 04/11/2023   ALT 18 04/11/2023   ANIONGAP 6 04/11/2023    GFR: Estimated Creatinine Clearance: 68.9 mL/min (by C-G formula based on SCr of 0.82 mg/dL).  No results found for this or any previous visit (from the past 240 hour(s)).  Radiology Studies: CT Angio Chest PE W and/or Wo Contrast  Result Date: 04/09/2023 CLINICAL DATA:  High probability for PE EXAM: CT ANGIOGRAPHY CHEST WITH CONTRAST TECHNIQUE: Multidetector CT imaging of the chest was performed using the standard protocol during bolus administration of intravenous contrast. Multiplanar  CT image reconstructions and MIPs were obtained to evaluate the vascular anatomy. RADIATION DOSE REDUCTION: This exam was performed according to the departmental dose-optimization program which includes automated exposure control, adjustment of the mA and/or kV according to patient size and/or use of iterative reconstruction technique. CONTRAST:  75mL OMNIPAQUE IOHEXOL 350 MG/ML SOLN COMPARISON:  CT chest abdomen and pelvis 10/05/2013 FINDINGS: Cardiovascular: Satisfactory opacification of the pulmonary arteries to the segmental level. No evidence of pulmonary embolism. Normal heart size. No pericardial effusion. Mediastinum/Nodes: No enlarged mediastinal, hilar, or axillary lymph nodes. Thyroid gland, trachea, and esophagus demonstrate no significant findings. Lungs/Pleura: Minimal emphysematous changes are present. There is scarring in the lung apices. There is a 4 mm nodule in the right lower lobe image 7/109 which is new from prior. There is no lung consolidation, pleural effusion or pneumothorax. Upper Abdomen: No acute abnormality. Musculoskeletal: Orthopedic pins are seen in the right humeral head. No acute fractures are seen. Review of the MIP images confirms the above findings. IMPRESSION: 1. No evidence for pulmonary embolism or other acute cardiopulmonary process. 2. Right solid pulmonary nodule measuring 4 mm. Per Fleischner Society Guidelines, no routine follow-up imaging is recommended. These guidelines do not apply to immunocompromised patients and patients with cancer. Follow up in patients with significant comorbidities as clinically warranted. For lung cancer screening, adhere to Lung-RADS guidelines. Reference: Radiology. 2017; 284(1):228-43. Electronically Signed   By: Darliss Cheney M.D.   On: 04/09/2023 23:25   DG Chest 2 View  Result Date: 04/09/2023 CLINICAL DATA:  Shortness of breath. Patient reports symptoms for 3 weeks. EXAM: CHEST - 2 VIEW COMPARISON:  Radiograph and CT 01/04/2016  FINDINGS: The cardiomediastinal contours are normal. Subsegmental linear atelectasis in the right middle low. Pulmonary vasculature is normal. No consolidation, pleural effusion, or pneumothorax. Right proximal humeral hardware partially included. At least 2 midthoracic compression deformities, age indeterminate but new from 2017. IMPRESSION: Subsegmental atelectasis in the right mid lung. Electronically Signed   By: Narda Rutherford M.D.   On: 04/09/2023 21:17      LOS: 1 day    Jacquelin Hawking, MD [facility] 04/11/2023, 4:47 PM   If 7PM-7AM, please contact night-coverage [email or web address]

## 2023-04-11 NOTE — Progress Notes (Signed)
RT assessed pt. Pt. Requested a breathing treatment but did not have any prn tx scheduled. Pt. Exhibiting sob. RT added a prn schedule to pt.

## 2023-04-12 DIAGNOSIS — J45901 Unspecified asthma with (acute) exacerbation: Secondary | ICD-10-CM | POA: Diagnosis not present

## 2023-04-12 LAB — ERYTHROPOIETIN: Erythropoietin: 2.8 m[IU]/mL (ref 2.6–18.5)

## 2023-04-12 MED ORDER — HYDROXYZINE HCL 25 MG PO TABS: 25.0000 mg | ORAL_TABLET | Freq: Four times a day (QID) | ORAL | 0 refills | Status: DC | PRN

## 2023-04-12 MED ORDER — PREDNISONE 20 MG PO TABS: 40.0000 mg | ORAL_TABLET | Freq: Every day | ORAL | 0 refills | Status: AC

## 2023-04-12 MED ORDER — ALBUTEROL SULFATE HFA 108 (90 BASE) MCG/ACT IN AERS: 1.0000 | INHALATION_SPRAY | RESPIRATORY_TRACT | 2 refills | Status: DC | PRN

## 2023-04-12 MED ORDER — MONTELUKAST SODIUM 10 MG PO TABS: 10.0000 mg | ORAL_TABLET | Freq: Every day | ORAL | 2 refills | Status: DC

## 2023-04-12 MED ORDER — FOLIC ACID 1 MG PO TABS: 1.0000 mg | ORAL_TABLET | Freq: Every day | ORAL | 2 refills | Status: AC

## 2023-04-12 MED ORDER — AZITHROMYCIN 250 MG PO TABS: 250.0000 mg | ORAL_TABLET | Freq: Every day | ORAL | 0 refills | Status: AC

## 2023-04-12 MED ORDER — ADULT MULTIVITAMIN W/MINERALS CH: 1.0000 | ORAL_TABLET | Freq: Every day | ORAL | Status: DC

## 2023-04-12 MED ORDER — SUMATRIPTAN SUCCINATE 100 MG PO TABS: 100.0000 mg | ORAL_TABLET | ORAL | 0 refills | Status: AC | PRN

## 2023-04-12 MED ORDER — CHLORDIAZEPOXIDE HCL 25 MG PO CAPS: ORAL_CAPSULE | ORAL | 0 refills | Status: AC

## 2023-04-12 NOTE — Discharge Summary (Addendum)
Physician Discharge Summary   Patient: [name] MRN: [id number] DOB: 18-Jan-1966  Admit date:     04/09/2023  Discharge date: 04/12/23  Discharge Physician: Jacquelin Hawking, MD   PCP: Jerl Mina, MD   Recommendations at discharge:  PCP follow-up Continued alcohol cessation  Discharge Diagnoses: Principal Problem:   Asthma exacerbation Active Problems:   Alcohol withdrawal (HCC)  Resolved Problems:   * No resolved hospital problems. *  Hospital Course: [name] is a 57 y.o. female with a history of asthma, anxiety, COPD, tobacco use, alcohol abuse. Patient presented secondary to progressively worsening shortness of breath and found to have evidence of a COPD vs asthma exacerbation.  Patient started on DuoNeb breathing treatments in addition to Solu-Medrol with improvement of symptoms.  Hospitalization complicated by development of alcohol withdrawal symptoms requiring benzodiazepine treatment. Patient improved with continued treatment and was stable for discharge. Patient motivated to quit alcohol and has follow-up with her PCP already.  Assessment and Plan:  COPD vs Asthma exacerbation No associated hypoxia. Patient started on Duonebs and Solu-medrol for treatment with improvement of symptoms. Patient started on Singulair and will discharge to complete prednisone burst and will continue breathing treatments.   Alcohol withdrawal Mild-moderate symptoms. Patient started on CIWA and Librium taper. Withdrawal symptoms improved prior to discharge. Discharge on continued Librium taper.   Tobacco/vape use Noted. Cessation discussed this admission.   Polycythemia Noted on admission. Resolved.   Leukocytosis Likely secondary to steroids. No evidence of infection.   Non-anion gap metabolic acidosis Mild-moderate. Resolved.   Hyperbilirubinemia Resolved.   Anxiety Patient's Lexapro was switched to Fluoxetine as an outpatient. Continue treatment.    Headache Recurrent. Frontal. No red flags.   Consultants: None Procedures performed: None  Disposition: Home Diet recommendation: Regular diet   DISCHARGE MEDICATION: Allergies as of 04/12/2023       Reactions   Codeine Nausea And Vomiting, Other (See Comments)   dizziness   Hydrocodone Other (See Comments)   Dizzy; Tolerates oxycodone fine        Medication List     STOP taking these medications    escitalopram 10 MG tablet Commonly known as: LEXAPRO   sulfamethoxazole-trimethoprim 400-80 MG tablet Commonly known as: BACTRIM       TAKE these medications    acamprosate 333 MG tablet Commonly known as: CAMPRAL Take 2 tablets (666 mg total) by mouth 3 (three) times daily with meals.   albuterol 1.25 MG/3ML nebulizer solution Commonly known as: ACCUNEB Take 1 ampule by nebulization 3 (three) times daily as needed for wheezing or shortness of breath. What changed: Another medication with the same name was changed. Make sure you understand how and when to take each.   albuterol 108 (90 Base) MCG/ACT inhaler Commonly known as: VENTOLIN HFA Inhale 1 puff into the lungs every 4 (four) hours as needed for wheezing or shortness of breath. What changed: when to take this   azithromycin 250 MG tablet Commonly known as: ZITHROMAX Take 1 tablet (250 mg total) by mouth daily for 1 day. Start taking on: Apr 13, 2023   busPIRone 15 MG tablet Commonly known as: BUSPAR Take 15 mg by mouth 2 (two) times daily.   chlordiazePOXIDE 25 MG capsule Commonly known as: LIBRIUM Take 1 capsule (25 mg total) by mouth 3 (three) times daily for 1 day, THEN 1 capsule (25 mg total) in the morning and at bedtime for 2 days, THEN 1 capsule (25 mg total) daily for 2 days. Start  taking on: Apr 12, 2023   cloNIDine 0.1 MG tablet Commonly known as: CATAPRES Take 0.1 mg by mouth as needed (anxiety).   Dupixent 300 MG/2ML Sopn Generic drug: Dupilumab Inject into the skin.   EPINEPHrine  0.3 mg/0.3 mL Soaj injection Commonly known as: EPI-PEN Inject 0.3 mg into the muscle as needed for anaphylaxis.   FLUoxetine 20 MG capsule Commonly known as: PROZAC Take 20 mg by mouth daily.   folic acid 1 MG tablet Commonly known as: FOLVITE Take 1 tablet (1 mg total) by mouth daily. Start taking on: Apr 13, 2023   gabapentin 600 MG tablet Commonly known as: NEURONTIN Take 600 mg by mouth 2 (two) times daily. What changed: Another medication with the same name was removed. Continue taking this medication, and follow the directions you see here.   hydrOXYzine 25 MG tablet Commonly known as: ATARAX Take 1 tablet (25 mg total) by mouth every 6 (six) hours as needed (anxiety/agitation or CIWA < or = 10).   lamoTRIgine 100 MG tablet Commonly known as: LAMICTAL Take 50 mg by mouth 2 (two) times daily. What changed: Another medication with the same name was removed. Continue taking this medication, and follow the directions you see here.   montelukast 10 MG tablet Commonly known as: SINGULAIR Take 1 tablet (10 mg total) by mouth at bedtime.   multivitamin with minerals Tabs tablet Take 1 tablet by mouth daily. Start taking on: Apr 13, 2023   nicotine 7 mg/24hr patch Commonly known as: NICODERM CQ - dosed in mg/24 hr Place 1 patch (7 mg total) onto the skin daily.   predniSONE 20 MG tablet Commonly known as: DELTASONE Take 2 tablets (40 mg total) by mouth daily for 2 days. Start taking on: Apr 13, 2023 What changed: how much to take   progesterone 100 MG capsule Commonly known as: PROMETRIUM Take 200 mg by mouth daily.   propranolol 10 MG tablet Commonly known as: INDERAL Take 1 tablet (10 mg total) by mouth every 8 (eight) hours. What changed:  when to take this reasons to take this   QC TUMERIC COMPLEX PO Take 2 capsules by mouth daily.   SUMAtriptan 100 MG tablet Commonly known as: IMITREX Take 1 tablet (100 mg total) by mouth every 2 (two) hours as needed  for migraine. May repeat in 2 hours if headache persists or recurs. Do not exceed 2 tablets within a 24 hour period. What changed:  additional instructions Another medication with the same name was removed. Continue taking this medication, and follow the directions you see here.   thiamine 100 MG tablet Commonly known as: Vitamin B-1 Take 1 tablet (100 mg total) by mouth daily.   traZODone 50 MG tablet Commonly known as: DESYREL Take 1 tablet (50 mg total) by mouth at bedtime as needed for sleep.        Follow-up Information     Jerl Mina, MD. Schedule an appointment as soon as possible for a visit.   Specialty: Family Medicine Why: For hospital follow-up Contact information: [address] [place] [facility] [place] [address] [phone]                Discharge Exam: BP (!) 140/78 (BP Location: Right Arm)   Pulse 89   Temp 97.9 F (36.6 C)   Resp 19   Ht 5\' 5"  (1.651 m)   Wt 68 kg   SpO2 96%   BMI 24.96 kg/m   General exam: Appears  calm and comfortable Respiratory system: Clear to auscultation. Respiratory effort normal. Cardiovascular system: S1 & S2 heard, RRR. No murmurs, rubs, gallops or clicks. Gastrointestinal system: Abdomen is nondistended, soft and nontender. Normal bowel sounds heard. Central nervous system: Alert and oriented. No focal neurological deficits. Musculoskeletal: No edema. No calf tenderness Skin: No cyanosis. No rashes Psychiatry: Judgement and insight appear normal. Mood & affect appropriate.   Condition at discharge: stable  The results of significant diagnostics from this hospitalization (including imaging, microbiology, ancillary and laboratory) are listed below for reference.   Imaging Studies: CT Angio Chest PE W and/or Wo Contrast  Result Date: 04/09/2023 CLINICAL DATA:  High probability for PE EXAM: CT ANGIOGRAPHY CHEST WITH CONTRAST TECHNIQUE: Multidetector CT imaging of the chest was performed using the  standard protocol during bolus administration of intravenous contrast. Multiplanar CT image reconstructions and MIPs were obtained to evaluate the vascular anatomy. RADIATION DOSE REDUCTION: This exam was performed according to the departmental dose-optimization program which includes automated exposure control, adjustment of the mA and/or kV according to patient size and/or use of iterative reconstruction technique. CONTRAST:  75mL OMNIPAQUE IOHEXOL 350 MG/ML SOLN COMPARISON:  CT chest abdomen and pelvis 10/05/2013 FINDINGS: Cardiovascular: Satisfactory opacification of the pulmonary arteries to the segmental level. No evidence of pulmonary embolism. Normal heart size. No pericardial effusion. Mediastinum/Nodes: No enlarged mediastinal, hilar, or axillary lymph nodes. Thyroid gland, trachea, and esophagus demonstrate no significant findings. Lungs/Pleura: Minimal emphysematous changes are present. There is scarring in the lung apices. There is a 4 mm nodule in the right lower lobe image 7/109 which is new from prior. There is no lung consolidation, pleural effusion or pneumothorax. Upper Abdomen: No acute abnormality. Musculoskeletal: Orthopedic pins are seen in the right humeral head. No acute fractures are seen. Review of the MIP images confirms the above findings. IMPRESSION: 1. No evidence for pulmonary embolism or other acute cardiopulmonary process. 2. Right solid pulmonary nodule measuring 4 mm. Per Fleischner Society Guidelines, no routine follow-up imaging is recommended. These guidelines do not apply to immunocompromised patients and patients with cancer. Follow up in patients with significant comorbidities as clinically warranted. For lung cancer screening, adhere to Lung-RADS guidelines. Reference: Radiology. 2017; 284(1):228-43. Electronically Signed   By: Darliss Cheney M.D.   On: 04/09/2023 23:25   DG Chest 2 View  Result Date: 04/09/2023 CLINICAL DATA:  Shortness of breath. Patient reports  symptoms for 3 weeks. EXAM: CHEST - 2 VIEW COMPARISON:  Radiograph and CT 01/04/2016 FINDINGS: The cardiomediastinal contours are normal. Subsegmental linear atelectasis in the right middle low. Pulmonary vasculature is normal. No consolidation, pleural effusion, or pneumothorax. Right proximal humeral hardware partially included. At least 2 midthoracic compression deformities, age indeterminate but new from 2017. IMPRESSION: Subsegmental atelectasis in the right mid lung. Electronically Signed   By: Narda Rutherford M.D.   On: 04/09/2023 21:17    Microbiology: Results for orders placed or performed during the hospital encounter of 12/25/22  Resp panel by RT-PCR (RSV, Flu A&B, Covid) Anterior Nasal Swab     Status: None   Collection Time: 12/25/22  4:53 PM   Specimen: Anterior Nasal Swab  Result Value Ref Range Status   SARS Coronavirus 2 by RT PCR NEGATIVE NEGATIVE Final    Comment: (NOTE) SARS-CoV-2 target nucleic acids are NOT DETECTED.  The SARS-CoV-2 RNA is generally detectable in upper respiratory specimens during the acute phase of infection. The lowest concentration of SARS-CoV-2 viral copies this assay can detect is 138 copies/mL. A  negative result does not preclude SARS-Cov-2 infection and should not be used as the sole basis for treatment or other patient management decisions. A negative result may occur with  improper specimen collection/handling, submission of specimen other than nasopharyngeal swab, presence of viral mutation(s) within the areas targeted by this assay, and inadequate number of viral copies(<138 copies/mL). A negative result must be combined with clinical observations, patient history, and epidemiological information. The expected result is Negative.  Fact Sheet for Patients:  BloggerCourse.com  Fact Sheet for Healthcare Providers:  SeriousBroker.it  This test is no t yet approved or cleared by the [place] FDA and  has been authorized for detection and/or diagnosis of SARS-CoV-2 by FDA under an Emergency Use Authorization (EUA). This EUA will remain  in effect (meaning this test can be used) for the duration of the COVID-19 declaration under Section 564(b)(1) of the Act, 21 U.S.C.section 360bbb-3(b)(1), unless the authorization is terminated  or revoked sooner.       Influenza A by PCR NEGATIVE NEGATIVE Final   Influenza B by PCR NEGATIVE NEGATIVE Final    Comment: (NOTE) The Xpert Xpress SARS-CoV-2/FLU/RSV plus assay is intended as an aid in the diagnosis of influenza from Nasopharyngeal swab specimens and should not be used as a sole basis for treatment. Nasal washings and aspirates are unacceptable for Xpert Xpress SARS-CoV-2/FLU/RSV testing.  Fact Sheet for Patients: BloggerCourse.com  Fact Sheet for Healthcare Providers: SeriousBroker.it  This test is not yet approved or cleared by the [place] FDA and has been authorized for detection and/or diagnosis of SARS-CoV-2 by FDA under an Emergency Use Authorization (EUA). This EUA will remain in effect (meaning this test can be used) for the duration of the COVID-19 declaration under Section 564(b)(1) of the Act, 21 U.S.C. section 360bbb-3(b)(1), unless the authorization is terminated or revoked.     Resp Syncytial Virus by PCR NEGATIVE NEGATIVE Final    Comment: (NOTE) Fact Sheet for Patients: BloggerCourse.com  Fact Sheet for Healthcare Providers: SeriousBroker.it  This test is not yet approved or cleared by the [place] FDA and has been authorized for detection and/or diagnosis of SARS-CoV-2 by FDA under an Emergency Use Authorization (EUA). This EUA will remain in effect (meaning this test can be used) for the duration of the COVID-19 declaration under Section 564(b)(1) of the Act, 21 U.S.C. section  360bbb-3(b)(1), unless the authorization is terminated or revoked.  Performed at [facility], [address] [place], [place] [address]     Labs: CBC: Recent Labs  Lab 04/09/23 2000 04/10/23 0200 04/11/23 0331  WBC 6.4 8.2 18.8*  NEUTROABS 4.0  --   --   HGB 17.2* 14.8 13.3  HCT 51.8* 43.7 40.7  MCV 96.5 95.0 97.1  PLT PLATELET CLUMPS NOTED ON SMEAR, UNABLE TO ESTIMATE 208 168   Basic Metabolic Panel: Recent Labs  Lab 04/09/23 2000 04/10/23 0200 04/11/23 0331  NA 139 134* 135  K 5.1 3.6 4.4  CL 104 102 106  CO2 21* 16* 23  GLUCOSE 125* 133* 122*  BUN 9 5* 18  CREATININE 0.91 0.70 0.82  CALCIUM 8.9 8.3* 8.6*  MG  --  1.9 2.3  PHOS  --  2.5  --    Liver Function Tests: Recent Labs  Lab 04/09/23 2000 04/10/23 0200 04/11/23 0331  AST 40 25 19  ALT 22 22 18   ALKPHOS 85 77 63  BILITOT 1.3* 0.4 0.9  PROT 7.6 6.8 5.8*  ALBUMIN 4.7 3.7 3.3*  Discharge time spent: 35 minutes.  Signed: Jacquelin Hawking, MD [facility] 04/12/2023

## 2023-04-12 NOTE — Progress Notes (Signed)
    Durable Medical Equipment  (From admission, onward)           Start     Ordered   04/12/23 1159  For home use only DME Nebulizer machine  Once       Question Answer Comment  Patient needs a nebulizer to treat with the following condition COPD (chronic obstructive pulmonary disease) (HCC)   Length of Need 12 Months      04/12/23 1158

## 2023-04-12 NOTE — Progress Notes (Signed)
Occupational Therapy Treatment and Discharge Patient Details Name: LAVARIA RIEHLE MRN: [id number] DOB: 08/03/1966 Today's Date: 04/12/2023   History of present illness Patient is a 57 y.o. female who presented to [facility] ED from home due to progressive shortness of breath for the past 3 weeks.  Symptoms were worse today.  Also reports dizziness after waking up around 6 AM this morning, resolving spontaneously and recurring with movement. PMH significant for asthma, chronic anxiety, COPD, current tobacco and vape user, alcohol abuse.   OT comments  Pt has returned to independence in ADLs and mobility. She is knowledgeable in energy conservation strategies. No further OT needs.    Recommendations for follow up therapy are one component of a multi-disciplinary discharge planning process, led by the attending physician.  Recommendations may be updated based on patient status, additional functional criteria and insurance authorization.    Assistance Recommended at Discharge PRN  Patient can return home with the following  Assist for transportation   Equipment Recommendations  None recommended by OT    Recommendations for Other Services      Precautions / Restrictions Precautions Precautions: None       Mobility Bed Mobility Overal bed mobility: Independent                  Transfers Overall transfer level: Independent                       Balance     Sitting balance-Leahy Scale: Normal       Standing balance-Leahy Scale: Good                             ADL either performed or assessed with clinical judgement   ADL Overall ADL's : Independent                                            Extremity/Trunk Assessment              Vision       Perception     Praxis      Cognition Arousal/Alertness: Awake/alert Behavior During Therapy: WFL for tasks assessed/performed Overall Cognitive Status: Within Functional Limits  for tasks assessed                                          Exercises      Shoulder Instructions       General Comments      Pertinent Vitals/ Pain       Pain Assessment Pain Assessment: Faces Faces Pain Scale: No hurt  Home Living                                          Prior Functioning/Environment              Frequency           Progress Toward Goals  OT Goals(current goals can now be found in the care plan section)  Progress towards OT goals: Goals met/education completed, patient discharged from OT     Plan All goals met and education completed, patient  discharged from OT services    Co-evaluation                 AM-PAC OT "6 Clicks" Daily Activity     Outcome Measure   Help from another person eating meals?: None Help from another person taking care of personal grooming?: None Help from another person toileting, which includes using toliet, bedpan, or urinal?: None Help from another person bathing (including washing, rinsing, drying)?: None Help from another person to put on and taking off regular upper body clothing?: None Help from another person to put on and taking off regular lower body clothing?: None 6 Click Score: 24    End of Session    OT Visit Diagnosis: Other (comment) (decreased activity tolerance)   Activity Tolerance Patient tolerated treatment well   Patient Left in bed;with call bell/phone within reach   Nurse Communication          Time: 1209-1223 OT Time Calculation (min): 14 min  Charges: OT General Charges $OT Visit: 1 Visit OT Treatments $Self Care/Home Management : 8-22 mins  Berna Spare, OTR/L Acute Rehabilitation Services Office: [phone]   Evern Bio 04/12/2023, 12:53 PM

## 2023-04-12 NOTE — Plan of Care (Signed)

## 2023-04-12 NOTE — TOC Transition Note (Addendum)
Transition of Care [facility]) - CM/SW Discharge Note   Patient Details  Name: [name] MRN: [id number] Date of Birth: 04/13/1966  [facility] CM/SW Contact:  Janae Bridgeman, RN Phone Number: 04/12/2023, 12:11 PM   Clinical Narrative:    Cm met with the patient at the bedside to discuss TOC needs for discharge to home today.  The patient admitted to the hospital for SOB and ETOH use at home.  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  The patient states that she currently smokes but plans on quitting - resources provided.  The patient states that her nebulizer machine is broken at home and she needs a new one.  Orders for new nebulizer and DME note were placed to be co-signed by the attending physician.  I called [employer] and asked for delivery of the nebulizer to the hospital before she goes home.  The patient states that she drinks alcohol and was receptive to resources for counseling to assist with cessation - OP substance abuse counseling provided in the discharge instructions.  The patient plans to have her boyfriend pick her up for transport to home this afternoon when he gets off work.  04/12/2023 1600 - CM called and spoke with Barbara Cower, CM with [employer] and he will have delivery of the nebulizer to the patient's hospital room by 4:30 pm today.  Bedside nursing updated.       Final next level of care: Home/Self Care Barriers to Discharge: No Barriers Identified (waiting on delivery of nebulizer machine)   Patient Goals and CMS Choice CMS Medicare.gov Compare Post Acute Care list provided to:: Patient Choice offered to / list presented to : Patient  Discharge Placement                         Discharge Plan and Services Additional resources added to the After Visit Summary for     Discharge Planning Services: CM Consult Post Acute Care Choice: Durable Medical Equipment           DME Arranged: Nebulizer/meds DME Agency: [employer] Date DME Agency Contacted: 04/12/23 Time DME Agency Contacted: 1205 Representative spoke with at DME Agency: [employer] for new Nebulizer machine            Social Determinants of Health (SDOH) Interventions SDOH Screenings   Food Insecurity: No Food Insecurity (04/11/2023)  Housing: Low Risk  (04/11/2023)  Transportation Needs: No Transportation Needs (04/11/2023)  Utilities: Not At Risk (04/11/2023)  Alcohol Screen: High Risk (12/25/2022)  Tobacco Use: Medium Risk (04/09/2023)     Readmission Risk Interventions     No data to display

## 2023-04-19 ENCOUNTER — Other Ambulatory Visit: Payer: Self-pay | Admitting: Family Medicine

## 2023-04-19 DIAGNOSIS — Z1231 Encounter for screening mammogram for malignant neoplasm of breast: Secondary | ICD-10-CM

## 2023-08-20 ENCOUNTER — Encounter (HOSPITAL_COMMUNITY): Payer: Self-pay | Admitting: Emergency Medicine

## 2023-08-20 ENCOUNTER — Other Ambulatory Visit: Payer: Self-pay

## 2023-08-20 ENCOUNTER — Emergency Department (HOSPITAL_COMMUNITY)
Admission: EM | Admit: 2023-08-20 | Discharge: 2023-08-21 | Disposition: A | Discharge status: Other Acute Inpatient Hospital | Payer: 59 | Attending: Emergency Medicine | Admitting: Emergency Medicine

## 2023-08-20 DIAGNOSIS — F332 Major depressive disorder, recurrent severe without psychotic features: Secondary | ICD-10-CM | POA: Diagnosis present

## 2023-08-20 DIAGNOSIS — J45909 Unspecified asthma, uncomplicated: Secondary | ICD-10-CM | POA: Insufficient documentation

## 2023-08-20 DIAGNOSIS — Z20822 Contact with and (suspected) exposure to covid-19: Secondary | ICD-10-CM | POA: Insufficient documentation

## 2023-08-20 DIAGNOSIS — Z7951 Long term (current) use of inhaled steroids: Secondary | ICD-10-CM | POA: Insufficient documentation

## 2023-08-20 DIAGNOSIS — Z87891 Personal history of nicotine dependence: Secondary | ICD-10-CM | POA: Insufficient documentation

## 2023-08-20 DIAGNOSIS — R45851 Suicidal ideations: Secondary | ICD-10-CM | POA: Diagnosis not present

## 2023-08-20 DIAGNOSIS — F10920 Alcohol use, unspecified with intoxication, uncomplicated: Secondary | ICD-10-CM

## 2023-08-20 DIAGNOSIS — Z8551 Personal history of malignant neoplasm of bladder: Secondary | ICD-10-CM | POA: Diagnosis not present

## 2023-08-20 DIAGNOSIS — Y907 Blood alcohol level of 200-239 mg/100 ml: Secondary | ICD-10-CM | POA: Diagnosis not present

## 2023-08-20 DIAGNOSIS — F1090 Alcohol use, unspecified, uncomplicated: Secondary | ICD-10-CM | POA: Diagnosis present

## 2023-08-20 DIAGNOSIS — F10129 Alcohol abuse with intoxication, unspecified: Secondary | ICD-10-CM | POA: Insufficient documentation

## 2023-08-20 LAB — RAPID URINE DRUG SCREEN, HOSP PERFORMED
Amphetamines: NOT DETECTED
Barbiturates: NOT DETECTED
Benzodiazepines: NOT DETECTED
Cocaine: NOT DETECTED
Opiates: NOT DETECTED
Tetrahydrocannabinol: NOT DETECTED

## 2023-08-20 LAB — COMPREHENSIVE METABOLIC PANEL
ALT: 23 U/L (ref 0–44)
AST: 23 U/L (ref 15–41)
Albumin: 3.5 g/dL (ref 3.5–5.0)
Alkaline Phosphatase: 130 U/L — ABNORMAL HIGH (ref 38–126)
Anion gap: 10 (ref 5–15)
BUN: 12 mg/dL (ref 6–20)
CO2: 22 mmol/L (ref 22–32)
Calcium: 8.3 mg/dL — ABNORMAL LOW (ref 8.9–10.3)
Chloride: 106 mmol/L (ref 98–111)
Creatinine, Ser: 0.6 mg/dL (ref 0.44–1.00)
GFR, Estimated: >60 mL/min (ref >60)
Glucose, Bld: 83 mg/dL (ref 70–99)
Potassium: 3.8 mmol/L (ref 3.5–5.1)
Sodium: 138 mmol/L (ref 135–145)
Total Bilirubin: 0.3 mg/dL (ref 0.3–1.2)
Total Protein: 7.4 g/dL (ref 6.5–8.1)

## 2023-08-20 LAB — CBC
HCT: 40.7 % (ref 36.0–46.0)
Hemoglobin: 12.9 g/dL (ref 12.0–15.0)
MCH: 30.3 pg (ref 26.0–34.0)
MCHC: 31.7 g/dL (ref 30.0–36.0)
MCV: 95.5 fL (ref 80.0–100.0)
Platelets: 332 10*3/uL (ref 150–400)
RBC: 4.26 MIL/uL (ref 3.87–5.11)
RDW: 12.8 % (ref 11.5–15.5)
WBC: 6.4 10*3/uL (ref 4.0–10.5)
nRBC: 0 % (ref 0.0–0.2)

## 2023-08-20 LAB — SALICYLATE LEVEL: Salicylate Lvl: 17.8 mg/dL (ref 7.0–30.0)

## 2023-08-20 LAB — ETHANOL: Alcohol, Ethyl (B): 214 mg/dL — ABNORMAL HIGH (ref <10)

## 2023-08-20 LAB — ACETAMINOPHEN LEVEL: Acetaminophen (Tylenol), Serum: <10 ug/mL — ABNORMAL LOW (ref 10–30)

## 2023-08-20 NOTE — ED Triage Notes (Signed)
Pt states that she is here because she is depressed and having SI x 3 days. States that her plan is to take a bunch of medicine and go to sleep. ETOH on board, states that she had 2L of wine today.

## 2023-08-21 ENCOUNTER — Inpatient Hospital Stay
Admission: AD | Admit: 2023-08-21 | Discharge: 2023-08-25 | DRG: 885 | Disposition: A | Discharge status: Home / Self Care | Payer: 59 | Source: Intra-hospital | Attending: Psychiatry | Admitting: Psychiatry

## 2023-08-21 DIAGNOSIS — F332 Major depressive disorder, recurrent severe without psychotic features: Secondary | ICD-10-CM

## 2023-08-21 DIAGNOSIS — F17210 Nicotine dependence, cigarettes, uncomplicated: Secondary | ICD-10-CM | POA: Diagnosis present

## 2023-08-21 DIAGNOSIS — Z79899 Other long term (current) drug therapy: Secondary | ICD-10-CM | POA: Diagnosis not present

## 2023-08-21 DIAGNOSIS — F17290 Nicotine dependence, other tobacco product, uncomplicated: Secondary | ICD-10-CM | POA: Diagnosis present

## 2023-08-21 DIAGNOSIS — Z560 Unemployment, unspecified: Secondary | ICD-10-CM | POA: Diagnosis not present

## 2023-08-21 DIAGNOSIS — M62838 Other muscle spasm: Secondary | ICD-10-CM | POA: Diagnosis present

## 2023-08-21 DIAGNOSIS — F411 Generalized anxiety disorder: Secondary | ICD-10-CM | POA: Diagnosis present

## 2023-08-21 DIAGNOSIS — Y907 Blood alcohol level of 200-239 mg/100 ml: Secondary | ICD-10-CM | POA: Diagnosis present

## 2023-08-21 DIAGNOSIS — R45851 Suicidal ideations: Secondary | ICD-10-CM | POA: Diagnosis present

## 2023-08-21 DIAGNOSIS — M542 Cervicalgia: Secondary | ICD-10-CM | POA: Diagnosis present

## 2023-08-21 DIAGNOSIS — F909 Attention-deficit hyperactivity disorder, unspecified type: Secondary | ICD-10-CM | POA: Diagnosis present

## 2023-08-21 DIAGNOSIS — Z5986 Financial insecurity: Secondary | ICD-10-CM | POA: Diagnosis not present

## 2023-08-21 DIAGNOSIS — Z1152 Encounter for screening for COVID-19: Secondary | ICD-10-CM | POA: Diagnosis not present

## 2023-08-21 LAB — SARS CORONAVIRUS 2 BY RT PCR: SARS Coronavirus 2 by RT PCR: NEGATIVE

## 2023-08-21 MED ORDER — THIAMINE MONONITRATE 100 MG PO TABS
100.0000 mg | ORAL_TABLET | Freq: Every day | ORAL | Status: DC
  Administered 2023-08-21: 100 mg via ORAL
  Filled 2023-08-21: qty 1

## 2023-08-21 MED ORDER — SUMATRIPTAN SUCCINATE 50 MG PO TABS
100.0000 mg | ORAL_TABLET | ORAL | Status: DC | PRN
  Administered 2023-08-21: 100 mg via ORAL
  Filled 2023-08-21 (×2): qty 2

## 2023-08-21 MED ORDER — FLUOXETINE HCL 20 MG PO CAPS
20.0000 mg | ORAL_CAPSULE | Freq: Every day | ORAL | Status: DC
  Administered 2023-08-21: 20 mg via ORAL
  Filled 2023-08-21: qty 1

## 2023-08-21 MED ORDER — HYDROXYZINE HCL 25 MG PO TABS: 25.0000 mg | ORAL_TABLET | Freq: Four times a day (QID) | ORAL | Status: DC | PRN

## 2023-08-21 MED ORDER — PROPRANOLOL HCL 20 MG PO TABS: 10.0000 mg | ORAL_TABLET | Freq: Three times a day (TID) | ORAL | Status: DC | PRN

## 2023-08-21 MED ORDER — LORAZEPAM 1 MG PO TABS
1.0000 mg | ORAL_TABLET | Freq: Three times a day (TID) | ORAL | Status: DC | PRN
  Administered 2023-08-21 (×2): 1 mg via ORAL
  Filled 2023-08-21 (×2): qty 1

## 2023-08-21 MED ORDER — ONDANSETRON 4 MG PO TBDP: 4.0000 mg | ORAL_TABLET | Freq: Three times a day (TID) | ORAL | Status: DC | PRN

## 2023-08-21 MED ORDER — ADULT MULTIVITAMIN W/MINERALS CH
1.0000 | ORAL_TABLET | Freq: Every day | ORAL | Status: DC
  Administered 2023-08-21: 1 via ORAL
  Filled 2023-08-21: qty 1

## 2023-08-21 MED ORDER — FOLIC ACID 1 MG PO TABS
1.0000 mg | ORAL_TABLET | Freq: Every day | ORAL | Status: DC
  Administered 2023-08-21: 1 mg via ORAL
  Filled 2023-08-21: qty 1

## 2023-08-21 MED ORDER — LAMOTRIGINE 100 MG PO TABS
100.0000 mg | ORAL_TABLET | Freq: Two times a day (BID) | ORAL | Status: DC
  Administered 2023-08-21 (×2): 100 mg via ORAL
  Filled 2023-08-21 (×2): qty 1

## 2023-08-21 MED ORDER — ALBUTEROL SULFATE (2.5 MG/3ML) 0.083% IN NEBU: 3.0000 mL | INHALATION_SOLUTION | Freq: Three times a day (TID) | RESPIRATORY_TRACT | Status: DC | PRN

## 2023-08-21 NOTE — ED Notes (Signed)
Pt belongings were placed in locker #40, locker #37 is not working at this time.

## 2023-08-21 NOTE — ED Notes (Addendum)
Called report to garnet RN at [employer] Contacted safe transport for transport of Pt

## 2023-08-21 NOTE — ED Notes (Signed)
[facility] called pts mother for collateral and to obtain pts daughter's phone number for additional collateral. Pts mother [name]) reports a long history of pt struggling with alcoholism and depression. 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 Pt lives across the street from her mother. Mother said that she and pt were at the beach recently and did not believe that she had not been drinking. Mother provided daughters phone number [name]: [phone]). Pts daughter tried to contact pt in the last few days but was unable to. Pts daughter contacted the police to perform a wellness check and found pt who became combative when approached.    [facility] attempted to contact pts daughter but her phone is currently not accepting calls. [facility] will try again.   Jacquelynn Cree, [facility]  08/21/23

## 2023-08-21 NOTE — ED Notes (Signed)
.ED TO INPATIENT HANDOFF REPORT  ED Nurse Name and Phone #: Buckner Malta [phone]  S Name/Age/Gender [name] 57 y.o. female Room/Bed: QMV78/ION62  Code Status   Code Status: Prior  Home/SNF/Other Home Patient oriented to: self, place, time, and situation Is this baseline? Yes   Triage Complete: Triage complete  Chief Complaint Depression  Triage Note Pt states that she is here because she is depressed and having SI x 3 days. States that her plan is to take a bunch of medicine and go to sleep. ETOH on board, states that she had 2L of wine today.    Allergies Allergies  Allergen Reactions   Codeine Nausea And Vomiting and Other (See Comments)    Dizziness, also   Hydrocodone Other (See Comments)    Dizzy; Tolerates oxycodone fine    Level of Care/Admitting Diagnosis ED Disposition     None       B Medical/Surgery History Past Medical History:  Diagnosis Date   ADHD (attention deficit hyperactivity disorder)    Alcohol dependence with acute alcoholic intoxication with complication (HCC) 01/05/2016   Anemia 1990s   Anxiety    Arthritis    neck   Asthma    uses inhalers every day   Cancer (HCC)    bladder   Cutaneous lupus erythematosus    Diverticulitis    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   History of anemia    Severe episode of recurrent major depressive disorder, without psychotic features (HCC) 01/07/2020   Sleep apnea    mild   SUI (stress urinary incontinence, female)    Past Surgical History:  Procedure Laterality Date   BLADDER SUSPENSION N/A 02/13/2013   Procedure: [facility];  Surgeon: Anner Crete, MD;  Location: [facility];  Service: Urology;  Laterality: N/A;   CESAREAN SECTION  12-23-2004    W/ LEFT TUBAL LIGATION AND RIGHT SALPINGECTOMY   CESAREAN SECTION  1995   CYSTOSCOPY WITH STENT PLACEMENT Bilateral 06/16/2020   Procedure: CYSTOSCOPY WITH FIREFLY INJECTION;  Surgeon: Crista Elliot, MD;  Location: [facility];  Service: Urology;  Laterality: Bilateral;   IR RADIOLOGIST EVAL & MGMT  04/21/2020   IR RADIOLOGIST EVAL & MGMT  05/12/2020   ORIF HUMERUS FRACTURE Right 04/28/2014   Procedure: OPEN REDUCTION INTERNAL FIXATION (ORIF) PROXIMAL HUMERUS FRACTURE;  Surgeon: Mable Paris, MD;  Location: [facility];  Service: Orthopedics;  Laterality: Right;   ORIF PROXIMAL HUMERUS FRACTURE Right 04/28/2014   DR CHANDLER   PROCTOSCOPY N/A 06/16/2020   Procedure: RIGID PROCTOSCOPY;  Surgeon: Karie Soda, MD;  Location: [facility];  Service: General;  Laterality: N/A;   TUBAL LIGATION       A IV Location/Drains/Wounds Patient Lines/Drains/Airways Status     Active Line/Drains/Airways     Name Placement date Placement time Site Days   Peripheral IV 04/11/23 22 G 1" Anterior;Left;Medial Forearm 04/11/23  1550  Forearm  132   Incision - 5 Ports Abdomen Right;Lateral Right;Medial Mid Left Left;Lateral 06/16/20  1345  -- 1161            Intake/Output Last 24 hours No intake or output data in the 24 hours ending 08/21/23 2220  Labs/Imaging Results for orders placed or performed during the hospital encounter of 08/20/23 (from the past 48 hour(s))  Comprehensive metabolic panel     Status: Abnormal   Collection Time: 08/20/23  9:39 PM  Result Value Ref Range   Sodium 138  135 - 145 mmol/L   Potassium 3.8 3.5 - 5.1 mmol/L   Chloride 106 98 - 111 mmol/L   CO2 22 22 - 32 mmol/L   Glucose, Bld 83 70 - 99 mg/dL    Comment: Glucose reference range applies only to samples taken after fasting for at least 8 hours.   BUN 12 6 - 20 mg/dL   Creatinine, Ser 3.24 0.44 - 1.00 mg/dL   Calcium 8.3 (L) 8.9 - 10.3 mg/dL   Total Protein 7.4 6.5 - 8.1 g/dL   Albumin 3.5 3.5 - 5.0 g/dL   AST 23 15 - 41 U/L   ALT 23 0 - 44 U/L   Alkaline Phosphatase 130 (H) 38 - 126 U/L   Total Bilirubin 0.3 0.3 - 1.2 mg/dL   GFR, Estimated >40 >10 mL/min    Comment: (NOTE) Calculated using the CKD-EPI  Creatinine Equation (2021)    Anion gap 10 5 - 15    Comment: Performed at [facility], [address] [place], [place] [address]  Ethanol     Status: Abnormal   Collection Time: 08/20/23  9:39 PM  Result Value Ref Range   Alcohol, Ethyl (B) 214 (H) <10 mg/dL    Comment: (NOTE) Lowest detectable limit for serum alcohol is 10 mg/dL.  For medical purposes only. Performed at [facility], [address] [place], [place] [address]   Salicylate level     Status: None   Collection Time: 08/20/23  9:39 PM  Result Value Ref Range   Salicylate Lvl 17.8 7.0 - 30.0 mg/dL    Comment: Performed at [facility], [address] [place], [place] [address]  Acetaminophen level     Status: Abnormal   Collection Time: 08/20/23  9:39 PM  Result Value Ref Range   Acetaminophen (Tylenol), Serum <10 (L) 10 - 30 ug/mL    Comment: (NOTE) Therapeutic concentrations vary significantly. A range of 10-30 ug/mL  may be an effective concentration for many patients. However, some  are best treated at concentrations outside of this range. Acetaminophen concentrations >150 ug/mL at 4 hours after ingestion  and >50 ug/mL at 12 hours after ingestion are often associated with  toxic reactions.  Performed at [facility], [address], [place], [place] [address]   cbc     Status: None   Collection Time: 08/20/23  9:39 PM  Result Value Ref Range   WBC 6.4 4.0 - 10.5 K/uL   RBC 4.26 3.87 - 5.11 MIL/uL   Hemoglobin 12.9 12.0 - 15.0 g/dL   HCT 87.5 64.3 - 32.9 %   MCV 95.5 80.0 - 100.0 fL   MCH 30.3 26.0 - 34.0 pg   MCHC 31.7 30.0 - 36.0 g/dL   RDW 51.8 84.1 - 66.0 %   Platelets 332 150 - 400 K/uL   nRBC 0.0 0.0 - 0.2 %    Comment: Performed at [facility], [address] [place], [place] [address]  Rapid urine drug screen (hospital performed)     Status: None   Collection Time: 08/20/23  9:46 PM  Result  Value Ref Range   Opiates NONE DETECTED NONE DETECTED   Cocaine NONE DETECTED NONE DETECTED   Benzodiazepines NONE DETECTED NONE DETECTED   Amphetamines NONE DETECTED NONE DETECTED   Tetrahydrocannabinol NONE DETECTED NONE DETECTED   Barbiturates NONE DETECTED NONE DETECTED    Comment: (NOTE) DRUG SCREEN FOR MEDICAL PURPOSES ONLY.  IF CONFIRMATION  IS NEEDED FOR ANY PURPOSE, NOTIFY LAB WITHIN 5 DAYS.  LOWEST DETECTABLE LIMITS FOR URINE DRUG SCREEN Drug Class                     Cutoff (ng/mL) Amphetamine and metabolites    1000 Barbiturate and metabolites    200 Benzodiazepine                 200 Opiates and metabolites        300 Cocaine and metabolites        300 THC                            50 Performed at [facility], [address] [place], [place] [address]   SARS Coronavirus 2 by RT PCR (hospital order, performed in [facility] hospital lab) *cepheid single result test* Anterior Nasal Swab     Status: None   Collection Time: 08/21/23  3:33 PM   Specimen: Anterior Nasal Swab  Result Value Ref Range   SARS Coronavirus 2 by RT PCR NEGATIVE NEGATIVE    Comment: (NOTE) SARS-CoV-2 target nucleic acids are NOT DETECTED.  The SARS-CoV-2 RNA is generally detectable in upper and lower respiratory specimens during the acute phase of infection. The lowest concentration of SARS-CoV-2 viral copies this assay can detect is 250 copies / mL. A negative result does not preclude SARS-CoV-2 infection and should not be used as the sole basis for treatment or other patient management decisions.  A negative result may occur with improper specimen collection / handling, submission of specimen other than nasopharyngeal swab, presence of viral mutation(s) within the areas targeted by this assay, and inadequate number of viral copies (<250 copies / mL). A negative result must be combined with clinical observations, patient history, and epidemiological information.  Fact  Sheet for Patients:   RoadLapTop.co.za  Fact Sheet for Healthcare Providers: http://kim-miller.com/  This test is not yet approved or  cleared by the [place] FDA and has been authorized for detection and/or diagnosis of SARS-CoV-2 by FDA under an Emergency Use Authorization (EUA).  This EUA will remain in effect (meaning this test can be used) for the duration of the COVID-19 declaration under Section 564(b)(1) of the Act, 21 U.S.C. section 360bbb-3(b)(1), unless the authorization is terminated or revoked sooner.  Performed at [facility], [address], [place], [place] [address]    No results found.  Pending Labs Unresulted Labs (From admission, onward)    None       Vitals/Pain Today's Vitals   08/21/23 1115 08/21/23 1648 08/21/23 1837 08/21/23 1859  BP: 139/86  (!) 148/88 (!) 148/88  Pulse: 90  84 84  Resp: 18  18   Temp: 97.9 F (36.6 C)  97.9 F (36.6 C)   TempSrc: Oral  Oral   SpO2: 97%  99%   Weight:      Height:      PainSc:  0-No pain      Isolation Precautions Airborne and Contact precautions  Medications Medications  albuterol (PROVENTIL) (2.5 MG/3ML) 0.083% nebulizer solution 3 mL (has no administration in time range)  FLUoxetine (PROZAC) capsule 20 mg (20 mg Oral Given 08/21/23 0937)  hydrOXYzine (ATARAX) tablet 25 mg (has no administration in time range)  propranolol (INDERAL) tablet 10 mg (has no administration in time range)  SUMAtriptan (IMITREX) tablet 100 mg (100 mg Oral Given 08/21/23 1540)  thiamine (  VITAMIN B1) tablet 100 mg (100 mg Oral Given 08/21/23 0937)  folic acid (FOLVITE) tablet 1 mg (1 mg Oral Given by Other 08/21/23 1131)  multivitamin with minerals tablet 1 tablet (1 tablet Oral Given 08/21/23 1132)  lamoTRIgine (LAMICTAL) tablet 100 mg (100 mg Oral Given by Other 08/21/23 2210)  LORazepam (ATIVAN) tablet 1 mg (1 mg Oral Given 08/21/23 2043)  ondansetron  (ZOFRAN-ODT) disintegrating tablet 4 mg (has no administration in time range)    Mobility walks     Focused Assessments Psych   R Recommendations: See Admitting Provider Note  Report given to:   Additional Notes:

## 2023-08-21 NOTE — Progress Notes (Signed)
TOC consulted for substance abuse resources. Resources provided and attached to AVS. No further TOC needs. TOC signing off.

## 2023-08-21 NOTE — Progress Notes (Signed)
Pt was accepted to [facility] TODAY 08/21/2023. Bed assignment: L30-Signed voluntary consent has been received   Pt meets inpatient criteria per Dahlia Byes, NP  Attending Physician will be Lewanda Rife, MD   Report can be called to: [phone]  Pt can arrive after 2130  Care Team Notified: Rona Ravens, RN, Dahlia Byes, NP, Lewanda Rife, MD, Caleen Essex, RN, and Alfredia Client, RN, Lorene Dy, Paramedic, Blima Singer, Jamal Maes, Paramedic   Maryjean Ka, MSW, [facility] 08/21/2023 9:01 PM

## 2023-08-21 NOTE — ED Provider Notes (Signed)
[place] EMERGENCY DEPARTMENT AT [facility] Provider Note  CSN: [id number] Arrival date & time: 08/20/23 2051  Chief Complaint(s) Suicidal  HPI [name] is a 57 y.o. female with a past medical history listed below who presents to the emergency department with increased depression and suicidal ideations.  Patient states that she lost her job recently which caused increase stress on her life.  Reports that she has a history of alcohol use disorder and has been sober for over 4 months but given the increase stress, she drank today.  States that she has been having suicide ideations for the past 3 days.  Denied any active plans but the triage note stated that she plan to take a bunch of medicines.  Apparently the patient was found in her home by her daughter who called the ambulance to bring her to the emergency department.  The note also states that the patient had 2 L of wine to drink today.  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/07/2020   Sleep apnea    mild   SUI (stress urinary incontinence, female)    Patient Active Problem List   Diagnosis Date Noted   Alcohol withdrawal (HCC) 04/10/2023   Asthma exacerbation 04/09/2023   Alcohol use disorder 12/26/2022   Asthma 12/26/2022   MDD (major depressive disorder), recurrent episode, severe (HCC) 12/25/2022   Depression, unspecified 01/06/2021   Alcohol use 01/06/2021   Insomnia 06/18/2020   SUI (stress urinary  incontinence, female)    Hypertension    History of anemia    GERD (gastroesophageal reflux disease)    History of alcoholism (HCC) 02/25/2020   Intractable migraine with aura without status migrainosus 02/25/2020   Attention deficit hyperactivity disorder (ADHD), predominantly inattentive type 01/07/2020   GAD (generalized anxiety disorder) 01/07/2020   MDD (major depressive disorder), recurrent severe, without psychosis (HCC) 01/07/2020   Diverticulitis with abscess/fistula s/p robotic left colectomy 06/16/2020 09/06/2019   MDD (major depressive disorder) 10/07/2015   Home Medication(s) Prior to Admission medications   Medication Sig Start Date End Date Taking? Authorizing Provider  acamprosate (CAMPRAL) 333 MG tablet Take 2 tablets (666 mg total) by mouth 3 (three) times daily with meals. 12/30/22   Leevy-Johnson, Lina Sar, NP  albuterol (ACCUNEB) 1.25 MG/3ML nebulizer solution Take 1 ampule by nebulization 3 (three) times daily as needed for wheezing or shortness of breath.    [provider]  albuterol (VENTOLIN HFA) 108 (90 Base) MCG/ACT inhaler Inhale 1 puff into the lungs every 4 (four) hours as needed for wheezing or shortness of breath. 04/12/23   Narda Bonds, MD  busPIRone (BUSPAR) 15 MG tablet Take 15 mg by mouth 2 (two) times daily. 03/19/23   [provider]  cloNIDine (CATAPRES) 0.1 MG tablet Take 0.1 mg by mouth as needed (anxiety). 03/19/23   [provider]  DUPIXENT 300 MG/2ML SOPN Inject into the skin. Patient not taking: Reported on  04/10/2023    [provider]  EPINEPHrine 0.3 mg/0.3 mL IJ SOAJ injection Inject 0.3 mg into the muscle as needed for anaphylaxis. 03/16/23   [provider]  FLUoxetine (PROZAC) 20 MG capsule Take 20 mg by mouth daily. 04/04/23   [provider]  gabapentin (NEURONTIN) 600 MG tablet Take 600 mg by mouth 2 (two) times daily.    [provider]  hydrOXYzine (ATARAX) 25 MG tablet Take 1  tablet (25 mg total) by mouth every 6 (six) hours as needed (anxiety/agitation or CIWA < or = 10). 04/12/23   Narda Bonds, MD  lamoTRIgine (LAMICTAL) 100 MG tablet Take 50 mg by mouth 2 (two) times daily.    [provider]  montelukast (SINGULAIR) 10 MG tablet Take 1 tablet (10 mg total) by mouth at bedtime. 04/12/23 07/11/23  Narda Bonds, MD  Multiple Vitamin (MULTIVITAMIN WITH MINERALS) TABS tablet Take 1 tablet by mouth daily. 04/13/23   Narda Bonds, MD  nicotine (NICODERM CQ - DOSED IN MG/24 HR) 7 mg/24hr patch Place 1 patch (7 mg total) onto the skin daily. Patient not taking: Reported on 04/10/2023 12/31/22   Maxie Barb A, NP  progesterone (PROMETRIUM) 100 MG capsule Take 200 mg by mouth daily.    [provider]  propranolol (INDERAL) 10 MG tablet Take 1 tablet (10 mg total) by mouth every 8 (eight) hours. Patient taking differently: Take 10 mg by mouth as needed (anxiety). 12/30/22   Leevy-Johnson, Lina Sar, NP  SUMAtriptan (IMITREX) 100 MG tablet Take 1 tablet (100 mg total) by mouth every 2 (two) hours as needed for migraine. May repeat in 2 hours if headache persists or recurs. Do not exceed 2 tablets within a 24 hour period. 04/12/23   Narda Bonds, MD  thiamine (VITAMIN B-1) 100 MG tablet Take 1 tablet (100 mg total) by mouth daily. 12/31/22   Leevy-Johnson, Lina Sar, NP  traZODone (DESYREL) 50 MG tablet Take 1 tablet (50 mg total) by mouth at bedtime as needed for sleep. 12/30/22   Leevy-Johnson, Lina Sar, NP  Turmeric (QC TUMERIC COMPLEX PO) Take 2 capsules by mouth daily.    [provider]                                                                                                                                    Allergies Codeine and Hydrocodone  Review of Systems Review of Systems As noted in HPI  Physical Exam Vital Signs  I have reviewed the triage vital signs BP 123/74 (BP Location: Left Arm)   Pulse 91   Temp 98.4 F (36.9  C) (Oral)   Resp 18   Ht 5\' 5"  (1.651 m)   Wt 68 kg   SpO2 93%   BMI 24.95 kg/m   Physical Exam Vitals reviewed.  Constitutional:      General: She is not in acute distress.  Appearance: She is well-developed. She is not diaphoretic.  HENT:     Head: Normocephalic and atraumatic.     Right Ear: External ear normal.     Left Ear: External ear normal.     Nose: Nose normal.  Eyes:     General: No scleral icterus.    Conjunctiva/sclera: Conjunctivae normal.  Neck:     Trachea: Phonation normal.  Cardiovascular:     Rate and Rhythm: Normal rate and regular rhythm.  Pulmonary:     Effort: Pulmonary effort is normal. No respiratory distress.     Breath sounds: No stridor.  Abdominal:     General: There is no distension.  Musculoskeletal:        General: Normal range of motion.     Cervical back: Normal range of motion.  Neurological:     Mental Status: She is alert and oriented to person, place, and time.  Psychiatric:        Behavior: Behavior normal.     ED Results and Treatments Labs (all labs ordered are listed, but only abnormal results are displayed) Labs Reviewed  COMPREHENSIVE METABOLIC PANEL - Abnormal; Notable for the following components:      Result Value   Calcium 8.3 (*)    Alkaline Phosphatase 130 (*)    All other components within normal limits  ETHANOL - Abnormal; Notable for the following components:   Alcohol, Ethyl (B) 214 (*)    All other components within normal limits  ACETAMINOPHEN LEVEL - Abnormal; Notable for the following components:   Acetaminophen (Tylenol), Serum <10 (*)    All other components within normal limits  SALICYLATE LEVEL  CBC  RAPID URINE DRUG SCREEN, HOSP PERFORMED                                                                                                                         EKG  EKG Interpretation Date/Time:    Ventricular Rate:    PR Interval:    QRS Duration:    QT Interval:    QTC Calculation:   R  Axis:      Text Interpretation:         Radiology No results found.  Medications Ordered in ED Medications  albuterol (ACCUNEB) nebulizer solution 1.25 mg (has no administration in time range)  FLUoxetine (PROZAC) capsule 20 mg (has no administration in time range)  hydrOXYzine (ATARAX) tablet 25 mg (has no administration in time range)  propranolol (INDERAL) tablet 10 mg (has no administration in time range)  SUMAtriptan (IMITREX) tablet 100 mg (has no administration in time range)  thiamine (VITAMIN B1) tablet 100 mg (has no administration in time range)   Procedures Procedures  (including critical care time) Medical Decision Making / ED Course   Medical Decision Making Amount and/or Complexity of Data Reviewed Labs: ordered. Decision-making details documented in ED Course.  Risk OTC drugs. Prescription drug management.    Relapse alcohol use disorder with increased depression and suicidal  ideations. Screening labs obtained for medical clearance and grossly reassuring.  Confirmed alcohol intoxication.  Patient will metabolize to freedom. Otherwise medically clear for behavioral health evaluation and disposition.  Home meds ordered.    Final Clinical Impression(s) / ED Diagnoses Final diagnoses:  Suicidal ideation  Severe episode of recurrent major depressive disorder, without psychotic features (HCC)  Alcoholic intoxication without complication (HCC)    This chart was dictated using voice recognition software.  Despite best efforts to proofread,  errors can occur which can change the documentation meaning.    Nira Conn, MD 08/21/23 [phone]

## 2023-08-21 NOTE — BH Assessment (Signed)
TTS clinician attempted to complete assessment. Juliette Alcide, RN, patient is asleep. TTS consult will be completed during next shift.

## 2023-08-21 NOTE — Progress Notes (Signed)
Received report from [facility] on pt coming to Room #30

## 2023-08-21 NOTE — Consult Note (Signed)
[facility] ED ASSESSMENT   Reason for Consult:  Psychiatry evaluation Referring Physician:  EDR Physician Patient Identification: BARAAH VANDELINDER MRN:  [id number] ED Chief Complaint: MDD (major depressive disorder), recurrent episode, severe (HCC)  Diagnosis:  Principal Problem:   MDD (major depressive disorder), recurrent episode, severe (HCC) Active Problems:   Alcohol use disorder   ED Assessment Time Calculation: Start Time: 1035 Stop Time: 1111 Total Time in Minutes (Assessment Completion): 36   Subjective:   [name] is a 57 y.o. female patient admitted with previous hx of Depression, Alcohol use disorder. ADHD and GAD came to the ER with complaint of Si x 3 days with a plan to OD on her pills.  Hx of one suicide attempt.  HPI:  Patient was seen this morning tearful reported feeling depressed and rated depression 8/10 with 10 being severe depression.  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  Since then she has been worried about paying her bills especially her rent.  Patient is minimizing her Alcohol use stating that the was clean for four months and only started drinking again the past week.  Patient has long hx of Alcohol use for 20 years per record.  She drank 2 lit. Of wine all weekend.  Patient states that her daughter called to check on her yesterday and she was sleeping.  She called the Police  do a welfare check on her when she did not open her door either.  Patient is currently sees DR Maggie Schwalbe for mental health outpatient care.  She takes Prozac, Lamictal Trazodone  and same are resumed. Today patient denied SI/HI/AVH but reports feeling depressed and frustrated that she lost her job.  She under stress of how to pay her rent soon.  She want to stop drinking as well and want Alcohol detox treatment.  She was involved with AA meetings and was sober for four months.  Alcohol level was 214 on arrival to the ER  and UDS is negative. We will resume home Medications and seek inpatient Psychiatry hospitalization for treatment of depression and Alcohol detox treatment.  We will fax out records to facilities seeking bed placement.  Patient denied suicide attempt but record review shows suicide attempt 2022 when she combined Pine sol, Alcohol and Hydroxyzine and drank.  She was hospitalized at the time.  Past Psychiatric History: hx of Depression, Alcohol use disorder. ADHD and GAD.  She denied Suicide attempt but record review shows suicide attempt 2022 when she combined Pine sol, Alcohol and Hydroxyzine and drank.  She was hospitalized at the time at [facility].  She was hospitalized at [facility] again in 2024 January.   Risk to Self or Others: Is the patient at risk to self? No Has the patient been a risk to self in the past 6 months? No Has the patient been a risk to self within the distant past? No Is the patient a risk to others? No Has the patient been a risk to others in the past 6 months? No Has the patient been a risk to others within the distant past? No  [place] Scale:  Flowsheet Row ED from 08/20/2023 in [facility] Emergency Department at [facility] ED to Hosp-Admission (Discharged) from 04/09/2023 in [facility] Ad[facility] (Discharged) [facility]  C-SSRS RISK CATEGORY High Risk No Risk Low Risk  AIMS:  , , ,  ,   ASAM:    Substance Abuse:     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   History of anemia    Severe episode of recurrent  major depressive disorder, without psychotic features (HCC) 01/07/2020   Sleep apnea    mild   SUI (stress urinary incontinence, female)     Past Surgical History:  Procedure Laterality Date   BLADDER SUSPENSION N/A 02/13/2013   Procedure: [facility] SLING;  Surgeon: Anner Crete, MD;  [facility]  Service: Urology;  Laterality: N/A;   CESAREAN SECTION  12-23-2004    W/ LEFT TUBAL LIGATION AND RIGHT SALPINGECTOMY   CESAREAN SECTION  1995   CYSTOSCOPY WITH STENT PLACEMENT Bilateral 06/16/2020   Procedure: CYSTOSCOPY WITH FIREFLY INJECTION;  Surgeon: Crista Elliot, MD;  Location: [facility];  Service: Urology;  Laterality: Bilateral;   IR RADIOLOGIST EVAL & MGMT  04/21/2020   IR RADIOLOGIST EVAL & MGMT  05/12/2020   ORIF HUMERUS FRACTURE Right 04/28/2014   Procedure: OPEN REDUCTION INTERNAL FIXATION (ORIF) PROXIMAL HUMERUS FRACTURE;  Surgeon: Mable Paris, MD;  Location: [facility];  Service: Orthopedics;  Laterality: Right;   ORIF PROXIMAL HUMERUS FRACTURE Right 04/28/2014   DR CHANDLER   PROCTOSCOPY N/A 06/16/2020   Procedure: RIGID PROCTOSCOPY;  Surgeon: Karie Soda, MD;  Location: [facility];  Service: General;  Laterality: N/A;   TUBAL LIGATION     Family History:  Family History  Adopted: Yes   Family Psychiatric  History: Paternal grandmother -Anxiety, Maternal Grandfather -Alcoholism. Social History:  Social History   Substance and Sexual Activity  Alcohol Use Yes   Alcohol/week: 2.0 standard drinks of alcohol   Types: 2 Glasses of wine per week   Comment: 12/29/20 restarted      Social History   Substance and Sexual Activity  Drug Use No    Social History   Socioeconomic History   Marital status: Married    Spouse name: Not on file   Number of children: Not on file   Years of education: Not on file   Highest education level: Not on file  Occupational History   Not on file  Tobacco Use   Smoking status: Former    Current packs/day: 0.00     Average packs/day: 0.3 packs/day for 20.0 years (5.0 ttl pk-yrs)    Types: Cigarettes    Start date: 09/24/1999    Quit date: 09/24/2019    Years since quitting: 3.9   Smokeless tobacco: Never  Vaping Use   Vaping status: Every Day  Substance and Sexual Activity   Alcohol use: Yes    Alcohol/week: 2.0 standard drinks of alcohol    Types: 2 Glasses of wine per week    Comment: 12/29/20 restarted    Drug use: No   Sexual activity: Not Currently    Birth control/protection: Post-menopausal  Other Topics Concern   Not on file  Social History Narrative   Not on file   Social Determinants of Health  Financial Resource Strain: High Risk (07/26/2023)   Received from [facility]   Overall Financial Resource Strain (CARDIA)    Difficulty of Paying Living Expenses: Very hard  Food Insecurity: Food Insecurity Present (07/26/2023)   Received from [facility]   Hunger Vital Sign    Worried About Running Out of Food in the Last Year: Sometimes true    Ran Out of Food in the Last Year: Sometimes true  Transportation Needs: No Transportation Needs (07/26/2023)   Received from [facility] - Transportation    In the past 12 months, has lack of transportation kept you from medical appointments or from getting medications?: No    Lack of Transportation (Non-Medical): No  Physical Activity: Not on file  Stress: Not on file  Social Connections: Not on file   Additional Social History:    Allergies:   Allergies  Allergen Reactions   Codeine Nausea And Vomiting and Other (See Comments)    dizziness   Hydrocodone Other (See Comments)    Dizzy; Tolerates oxycodone fine    Labs:  Results for orders placed or performed during the hospital encounter of 08/20/23 (from the past 48 hour(s))  Comprehensive metabolic panel     Status: Abnormal   Collection Time: 08/20/23  9:39 PM  Result Value Ref Range   Sodium 138 135 - 145 mmol/L    Potassium 3.8 3.5 - 5.1 mmol/L   Chloride 106 98 - 111 mmol/L   CO2 22 22 - 32 mmol/L   Glucose, Bld 83 70 - 99 mg/dL    Comment: Glucose reference range applies only to samples taken after fasting for at least 8 hours.   BUN 12 6 - 20 mg/dL   Creatinine, Ser 4.40 0.44 - 1.00 mg/dL   Calcium 8.3 (L) 8.9 - 10.3 mg/dL   Total Protein 7.4 6.5 - 8.1 g/dL   Albumin 3.5 3.5 - 5.0 g/dL   AST 23 15 - 41 U/L   ALT 23 0 - 44 U/L   Alkaline Phosphatase 130 (H) 38 - 126 U/L   Total Bilirubin 0.3 0.3 - 1.2 mg/dL   GFR, Estimated >10 >27 mL/min    Comment: (NOTE) Calculated using the CKD-EPI Creatinine Equation (2021)    Anion gap 10 5 - 15    Comment: Performed at [facility], [address] [place], [place] [address]  Ethanol     Status: Abnormal   Collection Time: 08/20/23  9:39 PM  Result Value Ref Range   Alcohol, Ethyl (B) 214 (H) <10 mg/dL    Comment: (NOTE) Lowest detectable limit for serum alcohol is 10 mg/dL.  For medical purposes only. Performed at [facility], [address], [place], [place] [address]   Salicylate level     Status: None   Collection Time: 08/20/23  9:39 PM  Result Value Ref Range   Salicylate Lvl 17.8 7.0 - 30.0 mg/dL    Comment: Performed at [facility], [address], [place], [place] [address]  Acetaminophen level     Status: Abnormal   Collection Time: 08/20/23  9:39 PM  Result Value Ref Range   Acetaminophen (Tylenol), Serum <10 (L) 10 - 30 ug/mL    Comment: (NOTE) Therapeutic concentrations vary significantly. A range of 10-30 ug/mL  may be an effective concentration for many patients. However, some  are best treated at concentrations outside of this range. Acetaminophen concentrations >150 ug/mL  at 4 hours after ingestion  and >50 ug/mL at 12 hours after ingestion are often associated with  toxic reactions.  Performed at [facility], [address] [place], [place] [address]   cbc     Status: None   Collection Time: 08/20/23  9:39 PM  Result Value Ref Range   WBC 6.4 4.0 - 10.5 K/uL   RBC 4.26 3.87 - 5.11 MIL/uL   Hemoglobin 12.9 12.0 - 15.0 g/dL   HCT 47.4 25.9 - 56.3 %   MCV 95.5 80.0 - 100.0 fL   MCH 30.3 26.0 - 34.0 pg   MCHC 31.7 30.0 - 36.0 g/dL   RDW 87.5 64.3 - 32.9 %   Platelets 332 150 - 400 K/uL   nRBC 0.0 0.0 - 0.2 %    Comment: Performed at [facility], [address], [place], [place] [address]  Rapid urine drug screen (hospital performed)     Status: None   Collection Time: 08/20/23  9:46 PM  Result Value Ref Range   Opiates NONE DETECTED NONE DETECTED   Cocaine NONE DETECTED NONE DETECTED   Benzodiazepines NONE DETECTED NONE DETECTED   Amphetamines NONE DETECTED NONE DETECTED   Tetrahydrocannabinol NONE DETECTED NONE DETECTED   Barbiturates NONE DETECTED NONE DETECTED    Comment: (NOTE) DRUG SCREEN FOR MEDICAL PURPOSES ONLY.  IF CONFIRMATION IS NEEDED FOR ANY PURPOSE, NOTIFY LAB WITHIN 5 DAYS.  LOWEST DETECTABLE LIMITS FOR URINE DRUG SCREEN Drug Class                     Cutoff (ng/mL) Amphetamine and metabolites    1000 Barbiturate and metabolites    200 Benzodiazepine                 200 Opiates and metabolites        300 Cocaine and metabolites        300 THC                            50 Performed at [facility], [address] [place], [place] [address]     Current Facility-Administered Medications  Medication Dose Route Frequency Provider Last Rate Last Admin   albuterol (PROVENTIL) (2.5 MG/3ML) 0.083% nebulizer solution 3 mL  3 mL Nebulization TID PRN Cardama, Amadeo Garnet, MD       FLUoxetine (PROZAC) capsule 20 mg  20 mg Oral Daily Cardama, Amadeo Garnet, MD   20 mg at 08/21/23 3016   folic acid (FOLVITE) tablet 1 mg  1 mg Oral Daily Dahlia Byes C, NP       hydrOXYzine (ATARAX) tablet 25 mg  25 mg Oral Q6H PRN Cardama, Amadeo Garnet, MD        lamoTRIgine (LAMICTAL) tablet 100 mg  100 mg Oral BID Dahlia Byes C, NP       multivitamin with minerals tablet 1 tablet  1 tablet Oral Daily Parish Dubose C, NP       propranolol (INDERAL) tablet 10 mg  10 mg Oral TID PRN Cardama, Amadeo Garnet, MD       SUMAtriptan (IMITREX) tablet 100 mg  100 mg Oral Q2H PRN Cardama, Amadeo Garnet, MD       thiamine (VITAMIN B1) tablet 100 mg  100 mg Oral Daily Cardama, Amadeo Garnet, MD   100 mg at 08/21/23 0109   Current Outpatient Medications  Medication Sig Dispense Refill  acamprosate (CAMPRAL) 333 MG tablet Take 2 tablets (666 mg total) by mouth 3 (three) times daily with meals. 90 tablet 0   albuterol (ACCUNEB) 1.25 MG/3ML nebulizer solution Take 1 ampule by nebulization 3 (three) times daily as needed for wheezing or shortness of breath.     albuterol (VENTOLIN HFA) 108 (90 Base) MCG/ACT inhaler Inhale 1 puff into the lungs every 4 (four) hours as needed for wheezing or shortness of breath. 6.7 g 2   busPIRone (BUSPAR) 15 MG tablet Take 15 mg by mouth 2 (two) times daily.     cloNIDine (CATAPRES) 0.1 MG tablet Take 0.1 mg by mouth as needed (anxiety).     DUPIXENT 300 MG/2ML SOPN Inject into the skin. (Patient not taking: Reported on 04/10/2023)     EPINEPHrine 0.3 mg/0.3 mL IJ SOAJ injection Inject 0.3 mg into the muscle as needed for anaphylaxis.     FLUoxetine (PROZAC) 20 MG capsule Take 20 mg by mouth daily.     gabapentin (NEURONTIN) 600 MG tablet Take 600 mg by mouth 2 (two) times daily.     hydrOXYzine (ATARAX) 25 MG tablet Take 1 tablet (25 mg total) by mouth every 6 (six) hours as needed (anxiety/agitation or CIWA < or = 10). 30 tablet 0   lamoTRIgine (LAMICTAL) 100 MG tablet Take 50 mg by mouth 2 (two) times daily.     montelukast (SINGULAIR) 10 MG tablet Take 1 tablet (10 mg total) by mouth at bedtime. 30 tablet 2   Multiple Vitamin (MULTIVITAMIN WITH MINERALS) TABS tablet Take 1 tablet by mouth daily.     nicotine (NICODERM  CQ - DOSED IN MG/24 HR) 7 mg/24hr patch Place 1 patch (7 mg total) onto the skin daily. (Patient not taking: Reported on 04/10/2023) 28 patch 0   progesterone (PROMETRIUM) 100 MG capsule Take 200 mg by mouth daily.     propranolol (INDERAL) 10 MG tablet Take 1 tablet (10 mg total) by mouth every 8 (eight) hours. (Patient taking differently: Take 10 mg by mouth as needed (anxiety).) 30 tablet 0   SUMAtriptan (IMITREX) 100 MG tablet Take 1 tablet (100 mg total) by mouth every 2 (two) hours as needed for migraine. May repeat in 2 hours if headache persists or recurs. Do not exceed 2 tablets within a 24 hour period. 10 tablet 0   thiamine (VITAMIN B-1) 100 MG tablet Take 1 tablet (100 mg total) by mouth daily. 30 tablet 0   traZODone (DESYREL) 50 MG tablet Take 1 tablet (50 mg total) by mouth at bedtime as needed for sleep. 30 tablet 0   Turmeric (QC TUMERIC COMPLEX PO) Take 2 capsules by mouth daily.      Musculoskeletal: Strength & Muscle Tone: within normal limits Gait & Station: normal Patient leans: Front   Psychiatric Specialty Exam: Presentation  General Appearance:  Casual; Neat  Eye Contact: Fleeting  Speech: Clear and Coherent; Normal Rate  Speech Volume: Normal  Handedness: Right   Mood and Affect  Mood: Anxious; Depressed  Affect: Congruent; Depressed   Thought Process  Thought Processes: Coherent; Goal Directed  Descriptions of Associations:Intact  Orientation:Full (Time, Place and Person)  Thought Content:Logical  History of Schizophrenia/Schizoaffective disorder:No  Duration of Psychotic Symptoms:No data recorded Hallucinations:Hallucinations: None  Ideas of Reference:None  Suicidal Thoughts:Suicidal Thoughts: No  Homicidal Thoughts:Homicidal Thoughts: No   Sensorium  Memory: Immediate Good; Recent Good; Remote Good  Judgment: Intact  Insight: Present   Executive Functions  Concentration: Good  Attention  Span: Good  Recall: [place] Major of Knowledge: Good  Language: Good   Psychomotor Activity  Psychomotor Activity: Psychomotor Activity: Normal   Assets  Assets: Communication Skills; Desire for Improvement; Housing; Physical Health    Sleep  Sleep: Sleep: Good   Physical Exam: Physical Exam Vitals and nursing note reviewed.  Constitutional:      Appearance: Normal appearance.  HENT:     Head: Normocephalic.     Nose: Nose normal.  Cardiovascular:     Rate and Rhythm: Normal rate and regular rhythm.  Pulmonary:     Effort: Pulmonary effort is normal.  Musculoskeletal:        General: Normal range of motion.     Cervical back: Normal range of motion.  Skin:    General: Skin is dry.  Neurological:     Mental Status: She is alert and oriented to person, place, and time.  Psychiatric:        Attention and Perception: Attention and perception normal.        Mood and Affect: Mood is anxious and depressed. Affect is tearful.        Speech: Speech normal.        Behavior: Behavior normal. Behavior is cooperative.        Thought Content: Thought content normal.        Cognition and Memory: Cognition and memory normal.        Judgment: Judgment normal.    Review of Systems  Constitutional: Negative.   HENT: Negative.    Eyes: Negative.   Respiratory: Negative.    Cardiovascular: Negative.   Gastrointestinal: Negative.   Genitourinary: Negative.   Musculoskeletal: Negative.   Skin: Negative.   Neurological: Negative.   Endo/Heme/Allergies: Negative.   Psychiatric/Behavioral:  Positive for depression. The patient is nervous/anxious.    Blood pressure 123/74, pulse 91, temperature 98.4 F (36.9 C), temperature source Oral, resp. rate 18, height 5\' 5"  (1.651 m), weight 68 kg, SpO2 93%. Body mass index is 24.95 kg/m.  Medical Decision Making: Patient meets criteria for inpatient Psychiatry hospitalization for Alcohol detox treatment and treatment for  depression.  Records will be faxed to facilities with available bed.  Home Medications are resumed already.  Problem 1: Recurrent Major Depressive disorder, severe without Psychotic features  Problem 2: Alcohol use disorder, Moderate dependence. Disposition:  admit, seek bed placement.  Earney Navy, NP  PMHNP-BC 08/21/2023 11:12 AM

## 2023-08-21 NOTE — Progress Notes (Signed)
Pt was accepted to [facility] Unit TODAY 08/21/2023. Bed assignment: L30  Pt meets inpatient criteria per Dahlia Byes, NP  Attending Physician will be Lewanda Rife, MD   Report can be called to: [phone]  Pt can arrive after pending discharges  Care Team Notified: Rona Ravens, RN, Dahlia Byes, NP, Lewanda Rife, MD, Caleen Essex, RN, and Alfredia Client, RN   [place], [place]  08/21/2023 2:15 PM

## 2023-08-21 NOTE — Group Note (Signed)
Date:  08/21/2023 Time:  9:54 PM  Group Topic/Focus:  Early Warning Signs:   The focus of this group is to help patients identify signs or symptoms they exhibit before slipping into an unhealthy state or crisis.    Participation Level:  Did Not Attend  Participation Quality:      Affect:      Cognitive:      Insight: None  Engagement in Group:  None  Modes of Intervention:      Additional Comments:    Maeola Harman 08/21/2023, 9:54 PM

## 2023-08-22 ENCOUNTER — Encounter: Payer: Self-pay | Admitting: Nurse Practitioner

## 2023-08-22 ENCOUNTER — Other Ambulatory Visit: Payer: Self-pay

## 2023-08-22 DIAGNOSIS — F332 Major depressive disorder, recurrent severe without psychotic features: Secondary | ICD-10-CM | POA: Diagnosis not present

## 2023-08-22 MED ORDER — PROPRANOLOL HCL 10 MG PO TABS: 10.0000 mg | ORAL_TABLET | Freq: Three times a day (TID) | ORAL | Status: DC | PRN

## 2023-08-22 MED ORDER — BENZTROPINE MESYLATE 1 MG PO TABS: 1.0000 mg | ORAL_TABLET | Freq: Four times a day (QID) | ORAL | Status: DC | PRN

## 2023-08-22 MED ORDER — TRAZODONE HCL 50 MG PO TABS
50.0000 mg | ORAL_TABLET | Freq: Once | ORAL | Status: AC
  Administered 2023-08-22: 50 mg via ORAL
  Filled 2023-08-22: qty 1

## 2023-08-22 MED ORDER — LORAZEPAM 1 MG PO TABS: 1.0000 mg | ORAL_TABLET | Freq: Three times a day (TID) | ORAL | Status: DC | PRN

## 2023-08-22 MED ORDER — ALUM & MAG HYDROXIDE-SIMETH 200-200-20 MG/5ML PO SUSP: 30.0000 mL | ORAL | Status: DC | PRN

## 2023-08-22 MED ORDER — FOLIC ACID 1 MG PO TABS
1.0000 mg | ORAL_TABLET | Freq: Every day | ORAL | Status: DC
  Administered 2023-08-22 – 2023-08-25 (×4): 1 mg via ORAL
  Filled 2023-08-22 (×4): qty 1

## 2023-08-22 MED ORDER — ACETAMINOPHEN 325 MG PO TABS
650.0000 mg | ORAL_TABLET | Freq: Four times a day (QID) | ORAL | Status: DC | PRN
  Administered 2023-08-22 – 2023-08-25 (×4): 650 mg via ORAL
  Filled 2023-08-22 (×5): qty 2

## 2023-08-22 MED ORDER — THIAMINE MONONITRATE 100 MG PO TABS
100.0000 mg | ORAL_TABLET | Freq: Every day | ORAL | Status: DC
  Administered 2023-08-22 – 2023-08-25 (×4): 100 mg via ORAL
  Filled 2023-08-22 (×4): qty 1

## 2023-08-22 MED ORDER — FLUOXETINE HCL 20 MG PO CAPS
20.0000 mg | ORAL_CAPSULE | Freq: Every day | ORAL | Status: DC
  Administered 2023-08-22 – 2023-08-25 (×4): 20 mg via ORAL
  Filled 2023-08-22 (×4): qty 1

## 2023-08-22 MED ORDER — ADULT MULTIVITAMIN W/MINERALS CH
1.0000 | ORAL_TABLET | Freq: Every day | ORAL | Status: DC
  Administered 2023-08-22 – 2023-08-25 (×4): 1 via ORAL
  Filled 2023-08-22 (×4): qty 1

## 2023-08-22 MED ORDER — ALBUTEROL SULFATE (2.5 MG/3ML) 0.083% IN NEBU: 3.0000 mL | INHALATION_SOLUTION | Freq: Three times a day (TID) | RESPIRATORY_TRACT | Status: DC | PRN

## 2023-08-22 MED ORDER — NICOTINE 14 MG/24HR TD PT24
14.0000 mg | MEDICATED_PATCH | Freq: Every day | TRANSDERMAL | Status: DC
  Administered 2023-08-22 – 2023-08-25 (×4): 14 mg via TRANSDERMAL
  Filled 2023-08-22 (×4): qty 1

## 2023-08-22 MED ORDER — ENSURE ENLIVE PO LIQD
237.0000 mL | Freq: Two times a day (BID) | ORAL | Status: DC
  Administered 2023-08-23: 237 mL via ORAL

## 2023-08-22 MED ORDER — GABAPENTIN 300 MG PO CAPS
600.0000 mg | ORAL_CAPSULE | Freq: Two times a day (BID) | ORAL | Status: DC
  Administered 2023-08-22 – 2023-08-25 (×6): 600 mg via ORAL
  Filled 2023-08-22 (×6): qty 2

## 2023-08-22 MED ORDER — TRAZODONE HCL 50 MG PO TABS
50.0000 mg | ORAL_TABLET | Freq: Every evening | ORAL | Status: DC | PRN
  Administered 2023-08-22 – 2023-08-23 (×2): 50 mg via ORAL
  Filled 2023-08-22 (×2): qty 1

## 2023-08-22 MED ORDER — HYDROXYZINE HCL 25 MG PO TABS
25.0000 mg | ORAL_TABLET | Freq: Four times a day (QID) | ORAL | Status: DC | PRN
  Administered 2023-08-22 – 2023-08-23 (×2): 25 mg via ORAL
  Filled 2023-08-22 (×2): qty 1

## 2023-08-22 MED ORDER — HALOPERIDOL 5 MG PO TABS: 5.0000 mg | ORAL_TABLET | Freq: Four times a day (QID) | ORAL | Status: DC | PRN

## 2023-08-22 MED ORDER — LAMOTRIGINE 100 MG PO TABS
100.0000 mg | ORAL_TABLET | Freq: Two times a day (BID) | ORAL | Status: DC
  Administered 2023-08-22 – 2023-08-25 (×7): 100 mg via ORAL
  Filled 2023-08-22 (×7): qty 1

## 2023-08-22 MED ORDER — SUMATRIPTAN SUCCINATE 50 MG PO TABS: 100.0000 mg | ORAL_TABLET | ORAL | Status: DC | PRN

## 2023-08-22 NOTE — BH IP Treatment Plan (Signed)
Interdisciplinary Treatment and Diagnostic Plan Update  08/22/2023 Time of Session: 10:09 AM  [name] MRN: [id number]  Principal Diagnosis: Major depressive disorder, recurrent severe without psychotic features (HCC)  Secondary Diagnoses: Principal Problem:   Major depressive disorder, recurrent severe without psychotic features (HCC)   Current Medications:  Current Facility-Administered Medications  Medication Dose Route Frequency Provider Last Rate Last Admin   acetaminophen (TYLENOL) tablet 650 mg  650 mg Oral Q6H PRN Onuoha, Josephine C, NP       albuterol (PROVENTIL) (2.5 MG/3ML) 0.083% nebulizer solution 3 mL  3 mL Nebulization TID PRN Dahlia Byes C, NP       alum & mag hydroxide-simeth (MAALOX/MYLANTA) 200-200-20 MG/5ML suspension 30 mL  30 mL Oral Q4H PRN Onuoha, Josephine C, NP       haloperidol (HALDOL) tablet 5 mg  5 mg Oral Q6H PRN Welford Roche, Josephine C, NP       And   benztropine (COGENTIN) tablet 1 mg  1 mg Oral Q6H PRN Welford Roche, Josephine C, NP       feeding supplement (ENSURE ENLIVE / ENSURE PLUS) liquid 237 mL  237 mL Oral BID BM Lewanda Rife, MD       FLUoxetine (PROZAC) capsule 20 mg  20 mg Oral Daily Onuoha, Josephine C, NP   20 mg at 08/22/23 0958   folic acid (FOLVITE) tablet 1 mg  1 mg Oral Daily Dahlia Byes C, NP   1 mg at 08/22/23 0958   hydrOXYzine (ATARAX) tablet 25 mg  25 mg Oral Q6H PRN Dahlia Byes C, NP   25 mg at 08/22/23 0051   lamoTRIgine (LAMICTAL) tablet 100 mg  100 mg Oral BID Dahlia Byes C, NP   100 mg at 08/22/23 0958   LORazepam (ATIVAN) tablet 1 mg  1 mg Oral Q8H PRN Dahlia Byes C, NP       multivitamin with minerals tablet 1 tablet  1 tablet Oral Daily Dahlia Byes C, NP   1 tablet at 08/22/23 0958   nicotine (NICODERM CQ - dosed in mg/24 hours) patch 14 mg  14 mg Transdermal Daily Lewanda Rife, MD   14 mg at 08/22/23 0958   propranolol (INDERAL) tablet 10 mg  10 mg Oral TID PRN Earney Navy,  NP       SUMAtriptan (IMITREX) tablet 100 mg  100 mg Oral Q2H PRN Dahlia Byes C, NP       thiamine (VITAMIN B1) tablet 100 mg  100 mg Oral Daily Dahlia Byes C, NP   100 mg at 08/22/23 8413   PTA Medications: Medications Prior to Admission  Medication Sig Dispense Refill Last Dose   acamprosate (CAMPRAL) 333 MG tablet Take 2 tablets (666 mg total) by mouth 3 (three) times daily with meals. (Patient not taking: Reported on 08/21/2023) 90 tablet 0    albuterol (ACCUNEB) 1.25 MG/3ML nebulizer solution Take 1 ampule by nebulization 3 (three) times daily as needed for wheezing or shortness of breath.      albuterol (VENTOLIN HFA) 108 (90 Base) MCG/ACT inhaler Inhale 1 puff into the lungs every 4 (four) hours as needed for wheezing or shortness of breath. (Patient taking differently: Inhale 1-2 puffs into the lungs every 4 (four) hours as needed for wheezing or shortness of breath.) 6.7 g 2    ANORO ELLIPTA 62.5-25 MCG/ACT AEPB Inhale 1 puff into the lungs daily.      Aspirin-Salicylamide-Caffeine (BC HEADACHE POWDER PO) Take 1 packet by mouth as needed (for  headaches or pain).      busPIRone (BUSPAR) 15 MG tablet Take 15 mg by mouth 2 (two) times daily.      cloNIDine (CATAPRES) 0.1 MG tablet Take 0.1 mg by mouth 2 (two) times daily.      doxycycline (DORYX) 100 MG EC tablet Take 100 mg by mouth 2 (two) times daily.      DUPIXENT 300 MG/2ML SOPN Inject 300 mg into the skin every 14 (fourteen) days.      EPINEPHrine 0.3 mg/0.3 mL IJ SOAJ injection Inject 0.3 mg into the muscle as needed for anaphylaxis.      FLUoxetine (PROZAC) 40 MG capsule Take 40 mg by mouth daily.      fluticasone (FLONASE) 50 MCG/ACT nasal spray Place 1 spray into both nostrils 2 (two) times daily.      folic acid (FOLVITE) 1 MG tablet Take 1 mg by mouth daily.      gabapentin (NEURONTIN) 600 MG tablet Take 600 mg by mouth 2 (two) times daily.      hydrOXYzine (ATARAX) 25 MG tablet Take 1 tablet (25 mg total) by mouth  every 6 (six) hours as needed (anxiety/agitation or CIWA < or = 10). 30 tablet 0    lamoTRIgine (LAMICTAL) 100 MG tablet Take 100 mg by mouth daily.      MAGNESIUM PO Take 1 tablet by mouth 2 (two) times daily.      montelukast (SINGULAIR) 10 MG tablet Take 1 tablet (10 mg total) by mouth at bedtime. 30 tablet 2    Multiple Vitamin (MULTIVITAMIN WITH MINERALS) TABS tablet Take 1 tablet by mouth daily.      NICORETTE 2 MG gum Take 2-4 mg by mouth as needed for smoking cessation.      NICORETTE 2 MG lozenge Take 2-4 mg by mouth as needed for smoking cessation.      nicotine (NICODERM CQ - DOSED IN MG/24 HR) 7 mg/24hr patch Place 1 patch (7 mg total) onto the skin daily. (Patient not taking: Reported on 08/21/2023) 28 patch 0    Nicotine (NICODERM CQ TD) Place 1 patch onto the skin daily as needed (for smoking cessation- while hospitalized).      progesterone (PROMETRIUM) 100 MG capsule Take 200 mg by mouth at bedtime.      propranolol (INDERAL) 10 MG tablet Take 1 tablet (10 mg total) by mouth every 8 (eight) hours. (Patient not taking: Reported on 08/21/2023) 30 tablet 0    SUMAtriptan (IMITREX) 100 MG tablet Take 1 tablet (100 mg total) by mouth every 2 (two) hours as needed for migraine. May repeat in 2 hours if headache persists or recurs. Do not exceed 2 tablets within a 24 hour period. (Patient taking differently: Take 100 mg by mouth every 2 (two) hours as needed for migraine (May repeat in 2 hours if headache persists or recurs. Do not exceed 2 tablets within a 24 hour period.).) 10 tablet 0    thiamine (VITAMIN B-1) 100 MG tablet Take 1 tablet (100 mg total) by mouth daily. 30 tablet 0    traZODone (DESYREL) 50 MG tablet Take 1 tablet (50 mg total) by mouth at bedtime as needed for sleep. (Patient taking differently: Take 50 mg by mouth at bedtime.) 30 tablet 0    Turmeric (QC TUMERIC COMPLEX PO) Take 2 capsules by mouth daily.       Patient Stressors: Financial difficulties   Loss of job    Substance abuse    Patient Strengths: Average  or above average intelligence  Capable of independent living  Physical Health  Supportive family/friends   Treatment Modalities: Medication Management, Group therapy, Case management,  1 to 1 session with clinician, Psychoeducation, Recreational therapy.   Physician Treatment Plan for Primary Diagnosis: Major depressive disorder, recurrent severe without psychotic features (HCC) Long Term Goal(s):     Short Term Goals:    Medication Management: Evaluate patient's response, side effects, and tolerance of medication regimen.  Therapeutic Interventions: 1 to 1 sessions, Unit Group sessions and Medication administration.  Evaluation of Outcomes: Progressing  Physician Treatment Plan for Secondary Diagnosis: Principal Problem:   Major depressive disorder, recurrent severe without psychotic features (HCC)  Long Term Goal(s):     Short Term Goals:       Medication Management: Evaluate patient's response, side effects, and tolerance of medication regimen.  Therapeutic Interventions: 1 to 1 sessions, Unit Group sessions and Medication administration.  Evaluation of Outcomes: Progressing   RN Treatment Plan for Primary Diagnosis: Major depressive disorder, recurrent severe without psychotic features (HCC) Long Term Goal(s): Knowledge of disease and therapeutic regimen to maintain health will improve  Short Term Goals: Ability to remain free from injury will improve, Ability to verbalize frustration and anger appropriately will improve, Ability to demonstrate self-control, Ability to participate in decision making will improve, Ability to verbalize feelings will improve, Ability to disclose and discuss suicidal ideas, Ability to identify and develop effective coping behaviors will improve, and Compliance with prescribed medications will improve  Medication Management: RN will administer medications as ordered by provider, will assess and  evaluate patient's response and provide education to patient for prescribed medication. RN will report any adverse and/or side effects to prescribing provider.  Therapeutic Interventions: 1 on 1 counseling sessions, Psychoeducation, Medication administration, Evaluate responses to treatment, Monitor vital signs and CBGs as ordered, Perform/monitor CIWA, COWS, AIMS and Fall Risk screenings as ordered, Perform wound care treatments as ordered.  Evaluation of Outcomes: Progressing   LCSW Treatment Plan for Primary Diagnosis: Major depressive disorder, recurrent severe without psychotic features (HCC) Long Term Goal(s): Safe transition to appropriate next level of care at discharge, Engage patient in therapeutic group addressing interpersonal concerns.  Short Term Goals: Engage patient in aftercare planning with referrals and resources, Increase social support, Increase ability to appropriately verbalize feelings, Increase emotional regulation, Facilitate acceptance of mental health diagnosis and concerns, Facilitate patient progression through stages of change regarding substance use diagnoses and concerns, Identify triggers associated with mental health/substance abuse issues, and Increase skills for wellness and recovery  Therapeutic Interventions: Assess for all discharge needs, 1 to 1 time with Social worker, Explore available resources and support systems, Assess for adequacy in community support network, Educate family and significant other(s) on suicide prevention, Complete Psychosocial Assessment, Interpersonal group therapy.  Evaluation of Outcomes: Progressing   Progress in Treatment: Attending groups: Yes. and No. Participating in groups: Yes. and No. Taking medication as prescribed: Yes. Toleration medication: Yes. Family/Significant other contact made: No, will contact:  CSW will contact if given permission  Patient understands diagnosis: Yes. Discussing patient identified  problems/goals with staff: Yes. Medical problems stabilized or resolved: Yes. Denies suicidal/homicidal ideation: No. Issues/concerns per patient self-inventory: No. Other: None   New problem(s) identified: No, Describe:  None identified   New Short Term/Long Term Goal(s): detox, elimination of symptoms of psychosis, medication management for mood stabilization; elimination of SI thoughts; development of comprehensive mental wellness/sobriety plan.   Patient Goals:   " I need to get a  job" " To get over this depression"   Discharge Plan or Barriers: CSW will assist with appropriate discharge planning   Reason for Continuation of Hospitalization: Depression Medication stabilization Withdrawal symptoms  Estimated Length of Stay: 1 to 7 days   Last 3 [place] Suicide Severity Risk Score: Flowsheet Row Admission (Current) from 08/21/2023 in [facility] from 08/20/2023 in [facility] Department [facility] to Hosp-Admission (Discharged) from 04/09/2023 in [place] [facility] [place] [facility]  C-SSRS RISK CATEGORY No Risk High Risk No Risk       Last PHQ 2/9 Scores:     No data to display          Scribe for Treatment Team: Elza Rafter, Theresia Majors 08/22/2023 11:42 AM

## 2023-08-22 NOTE — Group Note (Signed)
Date:  08/22/2023 Time:  11:07 AM  Group Topic/Focus:  Goals Group:The focus of this group is to help patients establish short term and long term goals.Creating a realistic plan that the patients can accomplish when they are discharged.    Participation Level:  Active  Participation Quality:  Appropriate  Affect:  Appropriate  Cognitive:  Alert and Appropriate  Insight: Appropriate  Engagement in Group:  Engaged  Modes of Intervention:  Activity and Discussion  Additional Comments:    Marta Antu 08/22/2023, 11:07 AM

## 2023-08-22 NOTE — Group Note (Signed)
Recreation Therapy Group Note   Group Topic:Communication  Group Date: 08/22/2023 Start Time: 1400 End Time: 1445 Facilitators: Rosina Lowenstein, LRT, CTRS Location: Courtyard   Group Description: Emotional Check in. Patient sat and talked with LRT about how they are doing and whatever else is on their mind. LRT provided active listening, reassurance and encouragement. Pts were given the opportunity to listen to music or play cornhole while getting fresh air and sunlight in the courtyard.    Goal Area(s) Addressed: Patient will engage in conversation with LRT. Patient will communicate their wants, needs, or questions.  Patient will practice a new coping skill of "talking to someone".   Affect/Mood: N/A   Participation Level: Did not attend    Clinical Observations/Individualized Feedback: [name] did not attend group.  Plan: Continue to engage patient in RT group sessions 2-3x/week.   Rosina Lowenstein, LRT, CTRS 08/22/2023 2:53 PM

## 2023-08-22 NOTE — Progress Notes (Signed)
   08/22/23 1300  Psych Admission Type (Psych Patients Only)  Admission Status Voluntary  Psychosocial Assessment  Patient Complaints Anxiety  Eye Contact Fair  Facial Expression Anxious  Affect Anxious;Irritable  Speech Logical/coherent  Interaction Assertive  Motor Activity Slow  Appearance/Hygiene In scrubs  Behavior Characteristics Cooperative;Anxious  Mood Anxious;Irritable  Thought Process  Coherency WDL  Content WDL  Delusions None reported or observed  Perception WDL  Hallucination None reported or observed  Judgment Impaired  Confusion None  Danger to Self  Current suicidal ideation? Denies  Agreement Not to Harm Self Yes  Danger to Others  Danger to Others None reported or observed   Minimal interaction with staff and none with peers. Isolative. Did not attend group. Tolerated all medications and meals. Tremors noted with anxiety. CIWA 6. Irritable at times. Denies SI/HI/AVH

## 2023-08-22 NOTE — Progress Notes (Addendum)
   08/22/23 0030  Psych Admission Type (Psych Patients Only)  Admission Status Voluntary  Psychosocial Assessment  Patient Complaints Anxiety;Depression;Isolation;Insomnia;Substance abuse (alcohol)  Eye Contact Brief  Facial Expression Anxious  Affect Sad  Speech Logical/coherent  Interaction Assertive  Motor Activity Slow  Appearance/Hygiene In scrubs  Behavior Characteristics Cooperative;Anxious  Mood Anxious;Depressed;Pleasant  Thought Process  Coherency WDL  Content WDL  Delusions None reported or observed  Perception WDL  Hallucination None reported or observed  Judgment Impaired  Confusion None  Danger to Self  Current suicidal ideation? Denies  Agreement Not to Harm Self Yes  Description of Agreement verbal  Danger to Others  Danger to Others None reported or observed   Pt CIWA upon admission was 8.

## 2023-08-22 NOTE — Progress Notes (Signed)
NUTRITION ASSESSMENT  Pt identified as at risk on the Malnutrition Screen Tool  INTERVENTION:  -Continue regular diet -Continue Ensure Enlive po BID, each supplement provides 350 kcal and 20 grams of protein -Continue MVI with minerals daily -Continue 1 mg folic acid daily -Continue 161 mg thiamine daily  NUTRITION DIAGNOSIS: Unintentional weight loss related to sub-optimal intake as evidenced by pt report.   Goal: Pt to meet >/= 90% of their estimated nutrition needs.  Monitor:  PO intake  Assessment:  Pt admitted with MDD and alcohol use disorder.   Pt with multiple social stressors including job loss and financial difficulties. Pt worried about paying her rent and started drinking last weekend secondary to stress (has a 20 year history of alcohol abuse but was sober for 4 months until last weekend).   Reviewed wt hx; wt has been stable over the past 4 months. Per H&P, pt endorses 9 pound wt loss over the past month due to poor appetite. Pt drinks wine daily, smokes 1 PPD, and vapes nicotine.   Pt currently on a regular diet. She has been accepting of food and drink.   Medications reviewed and include MVI, folic acid, and thiamine.   Labs reviewed.    57 y.o. female  Height: Ht Readings from Last 1 Encounters:  08/22/23 5\' 5"  (1.651 m)    Weight: Wt Readings from Last 1 Encounters:  08/22/23 68.5 kg    Weight Hx: Wt Readings from Last 10 Encounters:  08/22/23 68.5 kg  08/20/23 68 kg  04/09/23 68 kg  12/22/21 72.6 kg  06/18/20 69 kg  06/08/20 67.6 kg  04/27/20 65.8 kg  04/03/20 67.1 kg  09/24/19 72.4 kg  09/06/19 72.6 kg    BMI:  Body mass index is 25.13 kg/m. Pt meets criteria for overweight based on current BMI.  Estimated Nutritional Needs: Kcal: 25-30 kcal/kg Protein: > 1 gram protein/kg Fluid: 1 ml/kcal  Diet Order:  Diet Order             Diet regular Room service appropriate? Yes; Fluid consistency: Thin  Diet effective now                   Pt is also offered choice of unit snacks mid-morning and mid-afternoon.  Pt is eating as desired.   Lab results and medications reviewed.   Levada Schilling, RD, LDN, CDCES Registered Dietitian II Certified Diabetes Care and Education Specialist Please refer to [facility] for RD and/or RD on-call/weekend/after hours pager

## 2023-08-22 NOTE — Tx Team (Signed)
Initial Treatment Plan 08/22/2023 1:04 AM [name] ZOX:[id number]    PATIENT STRESSORS: Financial difficulties   Loss of job   Substance abuse     PATIENT STRENGTHS: Average or above average intelligence  Capable of independent living  Physical Health  Supportive family/friends    PATIENT IDENTIFIED PROBLEMS: Alcohol use d/o  Tobacco use d/o  Depression  Anxiety  SI  ("Pt states she was not and is not suicidal; wants not to drink so much")           DISCHARGE CRITERIA:  Improved stabilization in mood, thinking, and/or behavior Motivation to continue treatment in a less acute level of care Verbal commitment to aftercare and medication compliance  PRELIMINARY DISCHARGE PLAN: Outpatient therapy Return to previous living arrangement  PATIENT/FAMILY INVOLVEMENT: This treatment plan has been presented to and reviewed with the patient, [name], and/or family member.  The patient and family have been given the opportunity to ask questions and make suggestions.  Victorino December, RN 08/22/2023, 1:04 AM

## 2023-08-22 NOTE — Progress Notes (Addendum)
   08/22/23 1920  Psych Admission Type (Psych Patients Only)  Admission Status Voluntary  Psychosocial Assessment  Patient Complaints None  Eye Contact Fair  Facial Expression Anxious  Affect Appropriate to circumstance  Speech Logical/coherent  Interaction Assertive  Motor Activity Slow  Appearance/Hygiene In scrubs  Behavior Characteristics Cooperative;Anxious  Mood Anxious;Pleasant  Thought Process  Coherency WDL  Content WDL  Delusions None reported or observed  Perception WDL  Hallucination None reported or observed  Judgment Impaired  Confusion None  Danger to Self  Current suicidal ideation? Denies  Danger to Others  Danger to Others None reported or observed   Progress note   D: Pt seen at nurse's station. Pt denies SI, HI, AVH. Pt rates pain  2/10 as emerging headache. Pt rates anxiety  0/10 and depression  0/10. Pt states she is doing well today. "I don't want any more Ativan. I'm trying to get a new job. The doctor had prescribed me propranolol for anxiety but I had to stop taking it because it affected my breathing and I have asthma. I do take Dupixent and I was due a dose today. I was also taking gabapentin for my anxiety and mood but I haven't had it here yet. I've taken it for a long time now." Checked pt PTA med list and confirmed dosages with her. Will contact provider for possible orders. Pt said that her breathing is fine now. She says she has had no major problems since using Dupixent. Pt endorsed good sleep last night after admission. CIWA=4. No other concerns noted at this time.  A: Pt provided support and encouragement. Pt given scheduled medication as prescribed. PRNs as appropriate. Q15 min checks for safety.   R: Pt safe on the unit. Will continue to monitor.
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
## 2023-08-22 NOTE — [facility] Counselor (Signed)
Adult Comprehensive Assessment  Patient ID: MENDA LEFFALL, female   DOB: 12/06/1965, 57 y.o.   MRN: [id number]  Information Source: Information source: Patient  Current Stressors:  Patient states their primary concerns and needs for treatment are:: "I was just depressed and my daughter thought I needed some help" Patient states their goals for this hospitilization and ongoing recovery are:: " I don't know why I'm here honesty, I can fill out worksheets at home" Educational / Learning stressors: Pt reports she was in school and is trying to go back Employment / Job issues: Pt reports she has been recently unemployed Family Relationships: None reported Surveyor, quantity / Lack of resources (include bankruptcy): Pt reports she never has enough money, and recently became unemployed Housing / Lack of housing: Pt reports she hasn't payed rent this month Physical health (include injuries & life threatening diseases): Pt reports she has Psychiatric nurse and has not been getting her inhaler Social relationships: "I don't really have any" Substance abuse: Pt reports she recently had a relapse from alcohol, and that she is currently in AA Bereavement / Loss: None reported  Living/Environment/Situation:  Living Arrangements: Alone Living conditions (as described by patient or guardian): [place]" Who else lives in the home?: Pt reports she lives alone How long has patient lived in current situation?: Pt reports it will be 2 years in November What is atmosphere in current home: Other (Comment) ("Calming")  Family History:  Marital status: Divorced Divorced, when?: July 2023 What types of issues is patient dealing with in the relationship?: "My drinking" Additional relationship information: NA Are you sexually active?: Yes What is your sexual orientation?: Heterosexual Has your sexual activity been affected by drugs, alcohol, medication, or emotional stress?: No Does patient have children?: Yes How  many children?: 2 How is patient's relationship with their children?: "We're close, we're very close"  Childhood History:  By whom was/is the patient raised?: Adoptive parents Additional childhood history information: Adopted as infant; Pt reports that she does not know her biological parents.Pt reports her mom and dad were "wonderful" Description of patient's relationship with caregiver when they were a child: "wonderful" Patient's description of current relationship with people who raised him/her: Pt reports her dad has passed and she currently lives across the street from her mom, who she helps to take care of How were you disciplined when you got in trouble as a child/adolescent?: Pt reports her dad would take the door off the hinges and "talk to me" Does patient have siblings?: Yes Number of Siblings: 2 Description of patient's current relationship with siblings: "I have 2 brothers and I get along good with them" Did patient suffer any verbal/emotional/physical/sexual abuse as a child?: No Did patient suffer from severe childhood neglect?: No Has patient ever been sexually abused/assaulted/raped as an adolescent or adult?: No Was the patient ever a victim of a crime or a disaster?: No Witnessed domestic violence?: No Has patient been affected by domestic violence as an adult?: No  Education:  Highest grade of school patient has completed: Pt reports she went to college Currently a Consulting civil engineer?: No Learning disability?: Yes What learning problems does patient have?: Pt reports in school she was reported to be very active and stated ADHD or ADD were her diagnosis  Employment/Work Situation:   Employment Situation: Unemployed Patient's Job has Been Impacted by Current Illness: No What is the Longest Time Patient has Held a Job?: 10 years Where was the Patient Employed at that Time?: [employer] Has Patient ever Been in the [employer]?: No  Financial Resources:   Financial  resources: No income Does patient have a Lawyer or guardian?: No  Alcohol/Substance Abuse:   What has been your use of drugs/alcohol within the last 12 months?: Pt reports she has relapsed and used alcohol If attempted suicide, did drugs/alcohol play a role in this?: No Alcohol/Substance Abuse Treatment Hx: Past Tx, Inpatient If yes, describe treatment: Pt reports she was in [place] for her last Tx, at [facility] Has alcohol/substance abuse ever caused legal problems?: Yes (Pt reports she has a DWI)  Social Support System:   Patient's Community Support System: Fair Museum/gallery exhibitions officer System: Pt reports she has 3 or 4 friends Type of faith/religion: Warehouse manager" How does patient's faith help to cope with current illness?: "going to church every Sunday"  Leisure/Recreation:   Do You Have Hobbies?: Yes Leisure and Hobbies: Pt reports she enjoys coloring, meditations, paint, abstract painting, walking, going to mall  Strengths/Needs:   What is the patient's perception of their strengths?: "I think I'm funny" "I feel like I'm pretty strong, but I don't reach out and ask for help" Patient states they can use these personal strengths during their treatment to contribute to their recovery: Pt does not report Patient states these barriers may affect/interfere with their treatment: None reported Patient states these barriers may affect their return to the community: None reported Other important information patient would like considered in planning for their treatment: None reported  Discharge Plan:   Currently receiving community mental health services: Yes (From Whom) (pt states she has a psychiatrist but can't remember their name) Patient states concerns and preferences for aftercare planning are: Pt would like therapy but states she can't afford it Patient states they will know when they are safe and ready for discharge when: " I don't feel like anything is being done for me  here, I don't want to hurt myself" Does patient have financial barriers related to discharge medications?: No Patient description of barriers related to discharge medications: None Will patient be returning to same living situation after discharge?: Yes  Summary/Recommendations:   Summary and Recommendations (to be completed by the evaluator): Patient is a 57 year old white female from [place], [place] [facility] [place]). She was admitted with previous hx of Depression, Alcohol use disorder,ADHD and GAD came to the ER with complaint of SI for 3 days with a plan to OD on her pills. She has recently become unemployed and is concerned about her bills. Patient has a history of alcohol use and was clean until she lost her job which she states caused her to relapse and binge drink. Patient has a daughter and son who she says are supportive of her. She also takes care of her elderly adoptive mother who lives across the street from her. 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 She reports she enjoys care taking roles and is having a hard time finding another job. 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  Recommendations include: crisis stabilization, therapeutic milieu, encourage group attendance and participation, medication management for mood stabilization and development of comprehensive mental wellness/sobriety plan.  Elza Rafter. 08/22/2023

## 2023-08-22 NOTE — [facility] Suicide Risk Assessment (Signed)
[facility] Admission Suicide Risk Assessment   Nursing information obtained from:  Patient Demographic factors:  Caucasian, Low socioeconomic status, Living alone, Unemployed Loss Factors:  Financial problems / change in socioeconomic status Historical Factors:  Impulsivity Risk Reduction Factors:  Positive social support, Positive therapeutic relationship   Principal Problem: Major depressive disorder, recurrent severe without psychotic features (HCC) Diagnosis:  Principal Problem:   Major depressive disorder, recurrent severe without psychotic features (HCC)  Subjective Data: [name] is a 57 y.o. female patient admitted with previous hx of Depression, Alcohol use disorder. ADHD and GAD came to the ER with complaint of SI x 3 days with a plan to OD on her pills.  Hx of one suicide attempt.   Continued Clinical Symptoms:  Alcohol Use Disorder Identification Test Final Score (AUDIT): 15 The "Alcohol Use Disorders Identification Test", Guidelines for Use in Primary Care, Second Edition.  World Science writer [facility]). Score between 0-7:  no or low risk or alcohol related problems. Score between 8-15:  moderate risk of alcohol related problems. Score between 16-19:  high risk of alcohol related problems. Score 20 or above:  warrants further diagnostic evaluation for alcohol dependence and treatment.   CLINICAL FACTORS:   Depression:   Anhedonia Hopelessness Impulsivity   Musculoskeletal: Strength & Muscle Tone: within normal limits Gait & Station: normal Patient leans: N/A  Psychiatric Specialty Exam:  Presentation  General Appearance:  Appropriate for Environment  Eye Contact: Fair  Speech: Clear and Coherent  Speech Volume: Normal  Handedness: Right   Mood and Affect  Mood: Depressed; Anxious  Affect: Congruent; Depressed   Thought Process  Thought Processes: Coherent; Goal Directed  Descriptions of Associations:Intact  Orientation:Full (Time,  Place and Person)  Thought Content:Abstract Reasoning  History of Schizophrenia/Schizoaffective disorder:No  Duration of Psychotic Symptoms:No data recorded Hallucinations:Hallucinations: None  Ideas of Reference:None  Suicidal Thoughts:Suicidal Thoughts: Yes, Active SI Active Intent and/or Plan: Without Intent  Homicidal Thoughts:Homicidal Thoughts: No   Sensorium  Memory: Immediate Fair; Remote Fair  Judgment: Impaired  Insight: Shallow   Executive Functions  Concentration: Fair  Attention Span: Fair  Recall: Fair  Fund of Knowledge: Fair  Language: Fair   Psychomotor Activity  Psychomotor Activity: Psychomotor Activity: Decreased   Assets  Assets: Communication Skills; Desire for Improvement; Housing; Physical Health   Sleep  Sleep: Sleep: Fair    Physical Exam: Physical Exam Constitutional:      Appearance: Normal appearance.  HENT:     Head: Normocephalic and atraumatic.     Nose: Nose normal.  Eyes:     Pupils: Pupils are equal, round, and reactive to light.  Cardiovascular:     Rate and Rhythm: Normal rate.  Pulmonary:     Effort: Pulmonary effort is normal.  Skin:    General: Skin is warm.  Neurological:     General: No focal deficit present.     Mental Status: She is alert and oriented to person, place, and time.    Review of Systems  Constitutional:  Negative for chills and fever.  HENT:  Negative for congestion and sore throat.   Eyes:  Negative for double vision.  Respiratory:  Negative for cough and shortness of breath.   Cardiovascular:  Negative for chest pain and palpitations.  Gastrointestinal:  Negative for nausea and vomiting.  Neurological:  Negative for dizziness and speech change.  Psychiatric/Behavioral:  Positive for depression and suicidal ideas.    Blood pressure (!) 148/79, pulse 65, temperature 97.9 F (36.6 C),  resp. rate 15, height 5\' 5"  (1.651 m), weight 68.5 kg, SpO2 95%. Body mass index is  25.13 kg/m.   COGNITIVE FEATURES THAT CONTRIBUTE TO RISK:  Polarized thinking and Thought constriction (tunnel vision)    SUICIDE RISK:   Moderate:  Frequent suicidal ideation with limited intensity, and duration, some specificity in terms of plans, no associated intent, good self-control, limited dysphoria/symptomatology, some risk factors present, and identifiable protective factors, including available and accessible social support.  PLAN OF CARE: H&P  I certify that inpatient services furnished can reasonably be expected to improve the patient's condition.   Lewanda Rife, MD \

## 2023-08-22 NOTE — Group Note (Signed)
Date:  08/22/2023 Time:  10:25 PM  Group Topic/Focus:  Crisis Planning:   The purpose of this group is to help patients create a crisis plan for use upon discharge or in the future, as needed.    Participation Level:  Did Not Attend  Participation Quality:   Did Not Attend  Affect:   Did Not Attend  Cognitive:   Did Not Attend  Insight: None  Engagement in Group:  None  Modes of Intervention:   Did Not Attend  Additional Comments:    Garry Heater 08/22/2023, 10:25 PM

## 2023-08-22 NOTE — H&P (Signed)
Psychiatric Admission Assessment Adult  Patient Identification: [name] MRN:  [id number] Date of Evaluation:  08/22/2023 Chief Complaint:  Major depressive disorder, recurrent severe without psychotic features (HCC) [F33.2] Principal Diagnosis: Major depressive disorder, recurrent severe without psychotic features (HCC) Diagnosis:  Principal Problem:   Major depressive disorder, recurrent severe without psychotic features (HCC)  History of Present Illness: [name] is a 57 y.o. female patient admitted with previous hx of Depression, Alcohol use disorder. ADHD and GAD came to the ER with complaint of SI x 3 days with a plan to OD on her pills.  Patient discussed in multidisciplinary team meeting today.  Patient seen in treatment team meeting and also morning rounds today. Patient endorsed depressed mood.  She was tearful during the assessment.  Patient mentioned that she recently lost her job. She is going through financial stress.  Patient said that she has been binge drinking prior to admission.  Patient's BAL in ER was 214.  Patient denies use of drugs.  She reports she vapes a few times a week.  Little vaginal been feeling helpless with depressed mood and anhedonia.  Patient said that she was involved with AA meetings she.  She was sober for 4 months.  She relapsed back due to stressors including finances and loss of job.  Patient denies manic or psychotic symptoms.  She denies having manic symptoms in the past  Past Psychiatric History:  hx of Depression, Alcohol use disorder. ADHD and GAD.  Reportedly,  suicide attempt 2022 when she combined [place] sol, Alcohol and Hydroxyzine and drank.  She was hospitalized at the time at [facility].  She was hospitalized at [facility]  in 2024 January.   Is the patient at risk to self? Yes.     Has the patient been a risk to self within the distant past? No.  Is the patient a risk to others? No.  Has the patient been a risk to others in the past 6 months? No.   Has the patient been a risk to others within the distant past? No.   [place] Scale:  Flowsheet Row Admission (Current) from 08/21/2023 in [facility] from 08/20/2023 in [facility] Emergency Department at [facility] ED to Hosp-Admission (Discharged) from 04/09/2023 in [facility]  C-SSRS RISK CATEGORY No Risk High Risk No Risk        Prior Inpatient Therapy: Yes.    Prior Outpatient Therapy: Yes.      Alcohol Screening: 1. How often do you have a drink containing alcohol?: 4 or more times a week 2. How many drinks containing alcohol do you have on a typical day when you are drinking?: 5 or 6 3. How often do you have six or more drinks on one occasion?: Daily or almost daily AUDIT-C Score: 10 4. How often during the last year have you found that you were not able to stop drinking once you had started?: Never 5. How often during the last year have you failed to do what was normally expected from you because of drinking?: Less than monthly 6. How often during the last year have you needed a first drink in the morning to get yourself going after a heavy drinking session?: Never 7. How often during the last year have you had a feeling of guilt of remorse after drinking?: Weekly 8. How often during the last year have you been unable to remember what happened the night before because you  had been drinking?: Less than monthly 9. Have you or someone else been injured as a result of your drinking?: No 10. Has a relative or friend or a doctor or another health worker been concerned about your drinking or suggested you cut down?: No Alcohol Use Disorder Identification Test Final Score (AUDIT): 15 Alcohol Brief Interventions/Follow-up: Alcohol education/Brief advice Substance Abuse History in the last 12 months:  Yes.   Patient reports she has been binge drinking.  BAL at the time of admission was 214.  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   History of anemia    Severe episode of recurrent major depressive disorder, without psychotic features (HCC) 01/07/2020   Sleep apnea    mild   SUI (stress urinary incontinence, female)     Past Surgical History:  Procedure Laterality Date   BLADDER SUSPENSION N/A 02/13/2013   Procedure: [facility] SLING;  Surgeon: Anner Crete, MD;  Location: [facility];  Service: Urology;  Laterality: N/A;   CESAREAN SECTION  12-23-2004    W/ LEFT TUBAL LIGATION AND RIGHT SALPINGECTOMY   CESAREAN SECTION  1995   CYSTOSCOPY WITH STENT PLACEMENT Bilateral 06/16/2020   Procedure: CYSTOSCOPY WITH FIREFLY INJECTION;  Surgeon: Crista Elliot, MD;  Location: [facility];  Service: Urology;  Laterality: Bilateral;   IR RADIOLOGIST EVAL & MGMT  04/21/2020   IR RADIOLOGIST EVAL & MGMT  05/12/2020   ORIF HUMERUS FRACTURE Right 04/28/2014   Procedure: OPEN REDUCTION INTERNAL FIXATION (ORIF) PROXIMAL HUMERUS FRACTURE;  Surgeon: Mable Paris, MD;  Location: [facility];  Service: Orthopedics;  Laterality: Right;   ORIF PROXIMAL HUMERUS FRACTURE Right 04/28/2014   DR CHANDLER   PROCTOSCOPY N/A 06/16/2020   Procedure: RIGID PROCTOSCOPY;  Surgeon: Karie Soda, MD;  Location: [facility];  Service: General;  Laterality: N/A;   TUBAL LIGATION     Family History:  Family History  Adopted: Yes    Tobacco Screening:  Social History   Tobacco Use  Smoking Status Every Day   Current packs/day: 1.00   Average packs/day: 0.3 packs/day for 22.7 years (7.7  ttl pk-yrs)   Types: Cigarettes   Start date: 09/24/1999   Last attempt to quit: 09/24/2019  Smokeless Tobacco Never    BH Tobacco Counseling     Are you interested in Tobacco Cessation Medications?  Yes, implement Nicotene Replacement Protocol Counseled patient on smoking cessation:  Refused/Declined practical counseling Reason Tobacco Screening Not Completed: No value filed.       Social History:  Social History   Substance and Sexual Activity  Alcohol Use Yes   Alcohol/week: 2.0 standard drinks of alcohol   Types: 2 Glasses of wine per week   Comment: 12/29/20 restarted      Social History   Substance and Sexual Activity  Drug Use No    Additional Social History:  Allergies:   Allergies  Allergen Reactions   Codeine Nausea And Vomiting and Other (See Comments)    Dizziness, also   Hydrocodone Other (See Comments)    Dizzy; Tolerates oxycodone fine   Lab Results:  Results for orders placed or performed during the hospital encounter of 08/20/23 (from the past 48 hour(s))  Comprehensive metabolic panel     Status: Abnormal   Collection Time: 08/20/23  9:39 PM  Result Value Ref Range   Sodium 138 135 - 145 mmol/L   Potassium 3.8 3.5 - 5.1 mmol/L   Chloride 106 98 - 111 mmol/L   CO2 22 22 - 32 mmol/L   Glucose, Bld 83 70 - 99 mg/dL    Comment: Glucose reference range applies only to samples taken after fasting for at least 8 hours.   BUN 12 6 - 20 mg/dL   Creatinine, Ser 6.29 0.44 - 1.00 mg/dL   Calcium 8.3 (L) 8.9 - 10.3 mg/dL   Total Protein 7.4 6.5 - 8.1 g/dL   Albumin 3.5 3.5 - 5.0 g/dL   AST 23 15 - 41 U/L   ALT 23 0 - 44 U/L   Alkaline Phosphatase 130 (H) 38 - 126 U/L   Total Bilirubin 0.3 0.3 - 1.2 mg/dL   GFR, Estimated >52 >84 mL/min    Comment: (NOTE) Calculated using the CKD-EPI Creatinine Equation (2021)    Anion gap 10 5 - 15    Comment: Performed at [facility], [address],  [place], [place] [address]  Ethanol     Status: Abnormal   Collection Time: 08/20/23  9:39 PM  Result Value Ref Range   Alcohol, Ethyl (B) 214 (H) <10 mg/dL    Comment: (NOTE) Lowest detectable limit for serum alcohol is 10 mg/dL.  For medical purposes only. Performed at [facility], [address], [place], [place] [address]   Salicylate level     Status: None   Collection Time: 08/20/23  9:39 PM  Result Value Ref Range   Salicylate Lvl 17.8 7.0 - 30.0 mg/dL    Comment: Performed at [facility], [address] [place], [place] [address]  Acetaminophen level     Status: Abnormal   Collection Time: 08/20/23  9:39 PM  Result Value Ref Range   Acetaminophen (Tylenol), Serum <10 (L) 10 - 30 ug/mL    Comment: (NOTE) Therapeutic concentrations vary significantly. A range of 10-30 ug/mL  may be an effective concentration for many patients. However, some  are best treated at concentrations outside of this range. Acetaminophen concentrations >150 ug/mL at 4 hours after ingestion  and >50 ug/mL at 12 hours after ingestion are often associated with  toxic reactions.  Performed at [facility], [address] [place], [place] [address]   cbc     Status: None   Collection Time: 08/20/23  9:39 PM  Result Value Ref Range   WBC 6.4 4.0 - 10.5 K/uL   RBC 4.26 3.87 - 5.11 MIL/uL   Hemoglobin 12.9 12.0 - 15.0 g/dL   HCT 74.2 59.5 - 63.8 %   MCV 95.5 80.0 - 100.0 fL   MCH 30.3 26.0 - 34.0 pg   MCHC 31.7 30.0 - 36.0 g/dL   RDW 75.6 43.3 - 29.5 %   Platelets 332 150 - 400 K/uL   nRBC 0.0 0.0 - 0.2 %    Comment: Performed at [facility], [address] [place], [place]  [address]  Rapid urine drug screen (hospital performed)     Status: None   Collection Time: 08/20/23  9:46 PM  Result Value Ref Range   Opiates NONE DETECTED NONE DETECTED   Cocaine NONE DETECTED NONE DETECTED   Benzodiazepines NONE DETECTED NONE  DETECTED   Amphetamines NONE DETECTED NONE DETECTED   Tetrahydrocannabinol NONE DETECTED NONE DETECTED   Barbiturates NONE DETECTED NONE DETECTED    Comment: (NOTE) DRUG SCREEN FOR MEDICAL PURPOSES ONLY.  IF CONFIRMATION IS NEEDED FOR ANY PURPOSE, NOTIFY LAB WITHIN 5 DAYS.  LOWEST DETECTABLE LIMITS FOR URINE DRUG SCREEN Drug Class                     Cutoff (ng/mL) Amphetamine and metabolites    1000 Barbiturate and metabolites    200 Benzodiazepine                 200 Opiates and metabolites        300 Cocaine and metabolites        300 THC                            50 Performed at [facility], [address], [place], [place] [address]   SARS Coronavirus 2 by RT PCR (hospital order, performed in [facility] hospital lab) *cepheid single result test* Anterior Nasal Swab     Status: None   Collection Time: 08/21/23  3:33 PM   Specimen: Anterior Nasal Swab  Result Value Ref Range   SARS Coronavirus 2 by RT PCR NEGATIVE NEGATIVE    Comment: (NOTE) SARS-CoV-2 target nucleic acids are NOT DETECTED.  The SARS-CoV-2 RNA is generally detectable in upper and lower respiratory specimens during the acute phase of infection. The lowest concentration of SARS-CoV-2 viral copies this assay can detect is 250 copies / mL. A negative result does not preclude SARS-CoV-2 infection and should not be used as the sole basis for treatment or other patient management decisions.  A negative result may occur with improper specimen collection / handling, submission of specimen other than nasopharyngeal swab, presence of viral mutation(s) within the areas targeted by this assay, and inadequate number of viral copies (<250 copies / mL). A negative result must be combined with clinical observations, patient history, and epidemiological information.  Fact Sheet for Patients:   RoadLapTop.co.za  Fact Sheet for Healthcare  Providers: http://kim-miller.com/  This test is not yet approved or  cleared by the [place] FDA and has been authorized for detection and/or diagnosis of SARS-CoV-2 by FDA under an Emergency Use Authorization (EUA).  This EUA will remain in effect (meaning this test can be used) for the duration of the COVID-19 declaration under Section 564(b)(1) of the Act, 21 U.S.C. section 360bbb-3(b)(1), unless the authorization is terminated or revoked sooner.  Performed at [facility], [address], [place], [place] [address]     Blood Alcohol level:  Lab Results  Component Value Date   ETH 214 (H) 08/20/2023   ETH 309 (HH) 04/09/2023    Metabolic Disorder Labs:  Lab Results  Component Value Date   HGBA1C 5.2 12/25/2022   MPG 102.54 12/25/2022   MPG 114.02 01/06/2021   No results found for: "PROLACTIN" Lab Results  Component Value Date   CHOL 223 (H) 12/25/2022   TRIG 57 12/25/2022   HDL 128 12/25/2022   CHOLHDL 1.7 12/25/2022   VLDL 11 12/25/2022  LDLCALC 84 12/25/2022   LDLCALC 82 01/06/2021    Current Medications: Current Facility-Administered Medications  Medication Dose Route Frequency Provider Last Rate Last Admin   acetaminophen (TYLENOL) tablet 650 mg  650 mg Oral Q6H PRN Onuoha, Josephine C, NP       albuterol (PROVENTIL) (2.5 MG/3ML) 0.083% nebulizer solution 3 mL  3 mL Nebulization TID PRN Welford Roche, Josephine C, NP       alum & mag hydroxide-simeth (MAALOX/MYLANTA) 200-200-20 MG/5ML suspension 30 mL  30 mL Oral Q4H PRN Onuoha, Josephine C, NP       haloperidol (HALDOL) tablet 5 mg  5 mg Oral Q6H PRN Welford Roche, Josephine C, NP       And   benztropine (COGENTIN) tablet 1 mg  1 mg Oral Q6H PRN Welford Roche, Josephine C, NP       feeding supplement (ENSURE ENLIVE / ENSURE PLUS) liquid 237 mL  237 mL Oral BID BM Lewanda Rife, MD       FLUoxetine (PROZAC) capsule 20 mg  20 mg Oral Daily Onuoha, Josephine C, NP   20 mg at 08/22/23  0958   folic acid (FOLVITE) tablet 1 mg  1 mg Oral Daily Dahlia Byes C, NP   1 mg at 08/22/23 0958   hydrOXYzine (ATARAX) tablet 25 mg  25 mg Oral Q6H PRN Dahlia Byes C, NP   25 mg at 08/22/23 0051   lamoTRIgine (LAMICTAL) tablet 100 mg  100 mg Oral BID Dahlia Byes C, NP   100 mg at 08/22/23 0958   LORazepam (ATIVAN) tablet 1 mg  1 mg Oral Q8H PRN Dahlia Byes C, NP       multivitamin with minerals tablet 1 tablet  1 tablet Oral Daily Dahlia Byes C, NP   1 tablet at 08/22/23 0958   nicotine (NICODERM CQ - dosed in mg/24 hours) patch 14 mg  14 mg Transdermal Daily Lewanda Rife, MD   14 mg at 08/22/23 0958   propranolol (INDERAL) tablet 10 mg  10 mg Oral TID PRN Earney Navy, NP       SUMAtriptan (IMITREX) tablet 100 mg  100 mg Oral Q2H PRN Dahlia Byes C, NP       thiamine (VITAMIN B1) tablet 100 mg  100 mg Oral Daily Dahlia Byes C, NP   100 mg at 08/22/23 8657   PTA Medications: Medications Prior to Admission  Medication Sig Dispense Refill Last Dose   acamprosate (CAMPRAL) 333 MG tablet Take 2 tablets (666 mg total) by mouth 3 (three) times daily with meals. (Patient not taking: Reported on 08/21/2023) 90 tablet 0    albuterol (ACCUNEB) 1.25 MG/3ML nebulizer solution Take 1 ampule by nebulization 3 (three) times daily as needed for wheezing or shortness of breath.      albuterol (VENTOLIN HFA) 108 (90 Base) MCG/ACT inhaler Inhale 1 puff into the lungs every 4 (four) hours as needed for wheezing or shortness of breath. (Patient taking differently: Inhale 1-2 puffs into the lungs every 4 (four) hours as needed for wheezing or shortness of breath.) 6.7 g 2    ANORO ELLIPTA 62.5-25 MCG/ACT AEPB Inhale 1 puff into the lungs daily.      Aspirin-Salicylamide-Caffeine (BC HEADACHE POWDER PO) Take 1 packet by mouth as needed (for headaches or pain).      busPIRone (BUSPAR) 15 MG tablet Take 15 mg by mouth 2 (two) times daily.      cloNIDine (CATAPRES)  0.1 MG tablet Take 0.1 mg by mouth 2 (  two) times daily.      doxycycline (DORYX) 100 MG EC tablet Take 100 mg by mouth 2 (two) times daily.      DUPIXENT 300 MG/2ML SOPN Inject 300 mg into the skin every 14 (fourteen) days.      EPINEPHrine 0.3 mg/0.3 mL IJ SOAJ injection Inject 0.3 mg into the muscle as needed for anaphylaxis.      FLUoxetine (PROZAC) 40 MG capsule Take 40 mg by mouth daily.      fluticasone (FLONASE) 50 MCG/ACT nasal spray Place 1 spray into both nostrils 2 (two) times daily.      folic acid (FOLVITE) 1 MG tablet Take 1 mg by mouth daily.      gabapentin (NEURONTIN) 600 MG tablet Take 600 mg by mouth 2 (two) times daily.      hydrOXYzine (ATARAX) 25 MG tablet Take 1 tablet (25 mg total) by mouth every 6 (six) hours as needed (anxiety/agitation or CIWA < or = 10). 30 tablet 0    lamoTRIgine (LAMICTAL) 100 MG tablet Take 100 mg by mouth daily.      MAGNESIUM PO Take 1 tablet by mouth 2 (two) times daily.      montelukast (SINGULAIR) 10 MG tablet Take 1 tablet (10 mg total) by mouth at bedtime. 30 tablet 2    Multiple Vitamin (MULTIVITAMIN WITH MINERALS) TABS tablet Take 1 tablet by mouth daily.      NICORETTE 2 MG gum Take 2-4 mg by mouth as needed for smoking cessation.      NICORETTE 2 MG lozenge Take 2-4 mg by mouth as needed for smoking cessation.      nicotine (NICODERM CQ - DOSED IN MG/24 HR) 7 mg/24hr patch Place 1 patch (7 mg total) onto the skin daily. (Patient not taking: Reported on 08/21/2023) 28 patch 0    Nicotine (NICODERM CQ TD) Place 1 patch onto the skin daily as needed (for smoking cessation- while hospitalized).      progesterone (PROMETRIUM) 100 MG capsule Take 200 mg by mouth at bedtime.      propranolol (INDERAL) 10 MG tablet Take 1 tablet (10 mg total) by mouth every 8 (eight) hours. (Patient not taking: Reported on 08/21/2023) 30 tablet 0    SUMAtriptan (IMITREX) 100 MG tablet Take 1 tablet (100 mg total) by mouth every 2 (two) hours as needed for migraine.  May repeat in 2 hours if headache persists or recurs. Do not exceed 2 tablets within a 24 hour period. (Patient taking differently: Take 100 mg by mouth every 2 (two) hours as needed for migraine (May repeat in 2 hours if headache persists or recurs. Do not exceed 2 tablets within a 24 hour period.).) 10 tablet 0    thiamine (VITAMIN B-1) 100 MG tablet Take 1 tablet (100 mg total) by mouth daily. 30 tablet 0    traZODone (DESYREL) 50 MG tablet Take 1 tablet (50 mg total) by mouth at bedtime as needed for sleep. (Patient taking differently: Take 50 mg by mouth at bedtime.) 30 tablet 0    Turmeric (QC TUMERIC COMPLEX PO) Take 2 capsules by mouth daily.       Musculoskeletal: Strength & Muscle Tone: within normal limits Gait & Station: normal Patient leans: N/A   Psychiatric Specialty Exam:   Presentation  General Appearance:  Appropriate for Environment   Eye Contact: Fair   Speech: Clear and Coherent   Speech Volume: Normal   Handedness: Right     Mood and Affect  Mood: Depressed; Anxious   Affect: Congruent; Tearful     Thought Process  Thought Processes: Coherent; Goal Directed   Descriptions of Associations:Intact   Orientation:Full (Time, Place and Person)   Thought Content:Abstract Reasoning   History of Schizophrenia/Schizoaffective disorder:No   Duration of Psychotic Symptoms:N/A  Hallucinations:Hallucinations: None   Ideas of Reference:None   Suicidal Thoughts:Suicidal Thoughts: Yes, with a plan to overdose PTA   Homicidal Thoughts:Homicidal Thoughts: No     Sensorium  Memory: Immediate Fair; Remote Fair   Judgment: Impaired   Insight: Shallow     Executive Functions  Concentration: Fair   Attention Span: Fair   Recall: [employer] of Knowledge: Fair   Language: Fair     Psychomotor Activity  Psychomotor Activity: Psychomotor Activity: Decreased     Assets  Assets: Communication Skills; Desire for Improvement;  Housing; Physical Health     Sleep  Sleep: Sleep: Fair       Physical Exam: Physical Exam Constitutional:      Appearance: Normal appearance.  HENT:     Head: Normocephalic and atraumatic.     Nose: Nose normal.  Eyes:     Pupils: Pupils are equal, round, and reactive to light.  Cardiovascular:     Rate and Rhythm: Normal rate.  Pulmonary:     Effort: Pulmonary effort is normal.  Skin:    General: Skin is warm.  Neurological:     General: No focal deficit present.     Mental Status: She is alert and oriented to person, place, and time.      Review of Systems  Constitutional:  Negative for chills and fever.  HENT:  Negative for congestion and sore throat.   Eyes:  Negative for double vision.  Respiratory:  Negative for cough and shortness of breath.   Cardiovascular:  Negative for chest pain and palpitations.  Gastrointestinal:  Negative for nausea and vomiting.  Neurological:  Negative for dizziness and speech change.  Psychiatric/Behavioral:  Positive for depression and suicidal ideas.    Blood pressure (!) 148/79, pulse 65, temperature 97.9 F (36.6 C), resp. rate 15, height 5\' 5"  (1.651 m), weight 68.5 kg, SpO2 95%. Body mass index is 25.13 kg/m.  Treatment Plan Summary: Daily contact with patient to assess and evaluate symptoms and progress in treatment and Medication management  Observation Level/Precautions:  15 minute checks  Laboratory:    Psychotherapy:    Medications:    Consultations:    Discharge Concerns:    Estimated LOS: 5-7 days  Other:     Physician Treatment Plan for Primary Diagnosis: Major depressive disorder, recurrent severe without psychotic features (HCC) Long Term Goal(s): Improvement in symptoms so as ready for discharge  Short Term Goals: Ability to identify changes in lifestyle to reduce recurrence of condition will improve, Ability to verbalize feelings will improve, Ability to disclose and discuss suicidal ideas, Ability to  demonstrate self-control will improve, Ability to identify and develop effective coping behaviors will improve, Ability to maintain clinical measurements within normal limits will improve, and Ability to identify triggers associated with substance abuse/mental health issues will improve  Patient is admitted to locked unit under safety precautions Patient has been started on home including Prozac 20 mg daily and Lamictal 100 mg by mouth twice daily, patient was advised to watch for any rashes Will start on low-dose of Klonopin, patient is scoring 8 on CIWA, will taper off Klonopin in few days  Social worker consulted to get collateral and help  with a safe discharge plan Patient was encouraged to attend group and work on coping strategies and a safe discharge plan Encouraged patient to stay sober and attend AA meetings upon discharge  I certify that inpatient services furnished can reasonably be expected to improve the patient's condition.    Lewanda Rife, MD

## 2023-08-23 DIAGNOSIS — F332 Major depressive disorder, recurrent severe without psychotic features: Secondary | ICD-10-CM | POA: Diagnosis not present

## 2023-08-23 MED ORDER — CYCLOBENZAPRINE HCL 5 MG PO TABS
5.0000 mg | ORAL_TABLET | Freq: Three times a day (TID) | ORAL | Status: DC | PRN
  Administered 2023-08-23 – 2023-08-25 (×5): 5 mg via ORAL
  Filled 2023-08-23 (×7): qty 1

## 2023-08-23 NOTE — Progress Notes (Signed)
[facility] MD Progress Note  08/23/2023 3:26 PM [name]  MRN:  [id number]   [name] is a 57 y.o. female patient admitted with previous hx of Depression, Alcohol use disorder. ADHD and GAD came to the ER with complaint of SI x 3 days with a plan to OD on her pills.   Subjective: Case discussed in multidisciplinary team today.  Chart reviewed and patient seen during rounds.  Patient reports that she feels better.  She is excited about her son visiting the patient on unit tonight.  Patient also talked about her previous job.  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  Patient reports that she that she wants to have a job where she can be around people.  She also reports walking as her coping strategies..  Patient was provided with support.  She has been attending groups and working on coping strategies and a safe discharge plan.  Patient is requesting Flexeril for muscle spasm and neck pain.  Today patient denies any intention of harming herself on the unit.  Principal Problem: Major depressive disorder, recurrent severe without psychotic features (HCC) Diagnosis: Principal Problem:   Major depressive disorder, recurrent severe without psychotic features (HCC)    Past Psychiatric History: hx of Depression, Alcohol use disorder. ADHD and GAD.  Reportedly,  suicide attempt 2022 when she combined Pine sol, Alcohol and Hydroxyzine and drank.  She was hospitalized at the time at [facility].  She was hospitalized at [facility]  in 2024 January.   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   History of anemia    Severe episode of recurrent major depressive disorder, without psychotic features (HCC) 01/07/2020   Sleep apnea    mild   SUI (stress urinary incontinence, female)     Past Surgical History:  Procedure Laterality Date   BLADDER SUSPENSION N/A 02/13/2013   Procedure: [facility] SLING;  Surgeon: Anner Crete, MD;  Location: [facility];  Service: Urology;  Laterality: N/A;   CESAREAN SECTION  12-23-2004    W/ LEFT TUBAL LIGATION AND RIGHT SALPINGECTOMY   CESAREAN SECTION  1995   CYSTOSCOPY WITH STENT PLACEMENT Bilateral 06/16/2020   Procedure: CYSTOSCOPY WITH FIREFLY INJECTION;  Surgeon: Crista Elliot, MD;  Location: [facility];  Service: Urology;  Laterality: Bilateral;   IR RADIOLOGIST EVAL & MGMT  04/21/2020   IR RADIOLOGIST EVAL & MGMT  05/12/2020   ORIF HUMERUS FRACTURE Right 04/28/2014   Procedure: OPEN REDUCTION INTERNAL FIXATION (ORIF) PROXIMAL HUMERUS FRACTURE;  Surgeon: Mable Paris, MD;  Location: [facility];  Service: Orthopedics;  Laterality: Right;   ORIF PROXIMAL HUMERUS FRACTURE Right 04/28/2014   DR CHANDLER   PROCTOSCOPY N/A 06/16/2020   Procedure: RIGID PROCTOSCOPY;  Surgeon: Karie Soda, MD;  Location: [facility];  Service: General;  Laterality: N/A;   TUBAL LIGATION     Family History:  Family History  Adopted: Yes   Social History:  Social History   Substance and Sexual Activity  Alcohol Use Yes   Alcohol/week: 2.0 standard drinks of alcohol   Types: 2 Glasses of wine per week   Comment: 12/29/20 restarted      Social History   Substance and Sexual Activity  Drug Use No    Social History   Socioeconomic History   Marital status: Married    Spouse name: Not on file   Number of children: Not on file   Years of education: Not on file   Highest education level: Not on file  Occupational History   Not on file  Tobacco Use   Smoking status: Every Day    Current packs/day: 1.00    Average packs/day: 0.3  packs/day for 22.7 years (7.7 ttl pk-yrs)    Types: Cigarettes    Start date: 09/24/1999    Last attempt to quit: 09/24/2019   Smokeless tobacco: Never  Vaping Use   Vaping status: Every Day   Substances: Nicotine  Substance and Sexual Activity   Alcohol use: Yes    Alcohol/week: 2.0 standard drinks of alcohol    Types: 2 Glasses of wine per week    Comment: 12/29/20 restarted    Drug use: No   Sexual activity: Not Currently    Birth control/protection: Post-menopausal  Other Topics Concern   Not on file  Social History Narrative   Not on file   Social Determinants of Health   Financial Resource Strain: High Risk (07/26/2023)   Received from [facility]   Overall Financial Resource Strain (CARDIA)    Difficulty of Paying Living Expenses: Very hard  Food Insecurity: No Food Insecurity (08/22/2023)   Hunger Vital Sign    Worried About Running Out of Food in the Last Year: Never true    Ran Out of Food in the Last Year: Never true  Recent Concern: Food Insecurity - Food Insecurity Present (07/26/2023)   Received from [facility]   Hunger Vital Sign    Worried About Running Out of Food in the Last Year: Sometimes true    Ran Out of Food in the Last Year: Sometimes true  Transportation Needs: No Transportation Needs (08/22/2023)   PRAPARE - Administrator, Civil Service (Medical): No    Lack of Transportation (Non-Medical): No  Physical Activity: Not on file  Stress: Not on file  Social Connections: Not on file   Additional Social History:                         Sleep: Fair  Appetite:  Fair  Current Medications: Current Facility-Administered Medications  Medication Dose Route Frequency Provider Last Rate Last Admin   acetaminophen (TYLENOL) tablet 650 mg  650 mg Oral Q6H PRN Dahlia Byes C, NP   650 mg at 08/22/23 1916   albuterol (PROVENTIL) (2.5 MG/3ML) 0.083% nebulizer solution 3 mL  3 mL Nebulization TID  PRN Earney Navy, NP       alum & mag hydroxide-simeth (MAALOX/MYLANTA) 200-200-20 MG/5ML suspension 30 mL  30 mL Oral Q4H PRN Onuoha, Josephine C, NP       haloperidol (HALDOL) tablet 5 mg  5 mg Oral Q6H PRN Onuoha, Josephine C, NP       And   benztropine (COGENTIN) tablet 1 mg  1 mg Oral Q6H PRN Onuoha, Josephine C, NP       feeding supplement (ENSURE ENLIVE / ENSURE PLUS) liquid 237 mL  237 mL Oral BID BM Jameson Morrow,  MD   237 mL at 08/23/23 0915   FLUoxetine (PROZAC) capsule 20 mg  20 mg Oral Daily Onuoha, Josephine C, NP   20 mg at 08/23/23 0911   folic acid (FOLVITE) tablet 1 mg  1 mg Oral Daily Onuoha, Josephine C, NP   1 mg at 08/23/23 0911   gabapentin (NEURONTIN) capsule 600 mg  600 mg Oral BID Rankin, Shuvon B, NP   600 mg at 08/23/23 0911   hydrOXYzine (ATARAX) tablet 25 mg  25 mg Oral Q6H PRN Dahlia Byes C, NP   25 mg at 08/22/23 0051   lamoTRIgine (LAMICTAL) tablet 100 mg  100 mg Oral BID Dahlia Byes C, NP   100 mg at 08/23/23 0911   LORazepam (ATIVAN) tablet 1 mg  1 mg Oral Q8H PRN Dahlia Byes C, NP       multivitamin with minerals tablet 1 tablet  1 tablet Oral Daily Dahlia Byes C, NP   1 tablet at 08/23/23 9147   nicotine (NICODERM CQ - dosed in mg/24 hours) patch 14 mg  14 mg Transdermal Daily Lewanda Rife, MD   14 mg at 08/23/23 0912   propranolol (INDERAL) tablet 10 mg  10 mg Oral TID PRN Earney Navy, NP       SUMAtriptan (IMITREX) tablet 100 mg  100 mg Oral Q2H PRN Dahlia Byes C, NP       thiamine (VITAMIN B1) tablet 100 mg  100 mg Oral Daily Onuoha, Josephine C, NP   100 mg at 08/23/23 0911   traZODone (DESYREL) tablet 50 mg  50 mg Oral QHS PRN Rankin, Shuvon B, NP   50 mg at 08/22/23 2130    Lab Results:  Results for orders placed or performed during the hospital encounter of 08/20/23 (from the past 48 hour(s))  SARS Coronavirus 2 by RT PCR (hospital order, performed in [facility] hospital lab) *cepheid single  result test* Anterior Nasal Swab     Status: None   Collection Time: 08/21/23  3:33 PM   Specimen: Anterior Nasal Swab  Result Value Ref Range   SARS Coronavirus 2 by RT PCR NEGATIVE NEGATIVE    Comment: (NOTE) SARS-CoV-2 target nucleic acids are NOT DETECTED.  The SARS-CoV-2 RNA is generally detectable in upper and lower respiratory specimens during the acute phase of infection. The lowest concentration of SARS-CoV-2 viral copies this assay can detect is 250 copies / mL. A negative result does not preclude SARS-CoV-2 infection and should not be used as the sole basis for treatment or other patient management decisions.  A negative result may occur with improper specimen collection / handling, submission of specimen other than nasopharyngeal swab, presence of viral mutation(s) within the areas targeted by this assay, and inadequate number of viral copies (<250 copies / mL). A negative result must be combined with clinical observations, patient history, and epidemiological information.  Fact Sheet for Patients:   RoadLapTop.co.za  Fact Sheet for Healthcare Providers: http://kim-miller.com/  This test is not yet approved or  cleared by the [place] FDA and has been authorized for detection and/or diagnosis of SARS-CoV-2 by FDA under an Emergency Use Authorization (EUA).  This EUA will remain in effect (meaning this test can be used) for the duration of the COVID-19 declaration under Section 564(b)(1) of the Act, 21 U.S.C. section 360bbb-3(b)(1), unless the authorization is terminated or revoked sooner.  Performed at [facility], [address] [place], [place] [address]     Blood Alcohol  level:  Lab Results  Component Value Date   ETH 214 (H) 08/20/2023   ETH 309 (HH) 04/09/2023    Metabolic Disorder Labs: Lab Results  Component Value Date   HGBA1C 5.2 12/25/2022   MPG 102.54 12/25/2022   MPG 114.02  01/06/2021   No results found for: "PROLACTIN" Lab Results  Component Value Date   CHOL 223 (H) 12/25/2022   TRIG 57 12/25/2022   HDL 128 12/25/2022   CHOLHDL 1.7 12/25/2022   VLDL 11 12/25/2022   LDLCALC 84 12/25/2022   LDLCALC 82 01/06/2021    Physical Findings: AIMS:  , ,  ,  ,    CIWA:  CIWA-Ar Total: 4 COWS:     Musculoskeletal: Strength & Muscle Tone: within normal limits Gait & Station: normal Patient leans: N/A   Psychiatric Specialty Exam:   Presentation  General Appearance:  Appropriate for Environment   Eye Contact: Fair   Speech: Clear and Coherent   Speech Volume: Normal   Handedness: Right     Mood and Affect  Mood: Depressed; Anxious   Affect: Congruent; Tearful     Thought Process  Thought Processes: Coherent; Goal Directed   Descriptions of Associations:Intact   Orientation:Full (Time, Place and Person)   Thought Content:Abstract Reasoning   History of Schizophrenia/Schizoaffective disorder:No   Duration of Psychotic Symptoms:N/A   Hallucinations:Hallucinations: None   Ideas of Reference:None   Suicidal Thoughts:Suicidal Thoughts: Passive SI   Homicidal Thoughts:Homicidal Thoughts: No     Sensorium  Memory: Immediate Fair; Remote Fair   Judgment: Impaired   Insight: Shallow     Executive Functions  Concentration: Fair   Attention Span: Fair   Recall: Fair   Fund of Knowledge: Fair   Language: Fair     Psychomotor Activity  Psychomotor Activity: Psychomotor Activity: Decreased     Assets  Assets: Communication Skills; Desire for Improvement; Housing; Physical Health     Sleep  Sleep: Sleep: Fair       Physical Exam: Physical Exam Constitutional:      Appearance: Normal appearance.  HENT:     Head: Normocephalic and atraumatic.     Nose: Nose normal.  Eyes:     Pupils: Pupils are equal, round, and reactive to light.  Cardiovascular:     Rate and Rhythm: Normal rate.   Pulmonary:     Effort: Pulmonary effort is normal.  Skin:    General: Skin is warm.  Neurological:     General: No focal deficit present.     Mental Status: She is alert and oriented to person, place, and time.      Review of Systems  Constitutional:  Negative for chills and fever.  HENT:  Negative for congestion and sore throat.   Eyes:  Negative for double vision.  Respiratory:  Negative for cough and shortness of breath.   Cardiovascular:  Negative for chest pain and palpitations.  Gastrointestinal:  Negative for nausea and vomiting.  Neurological:  Negative for dizziness and speech change.  .     Blood pressure 125/72, pulse 72, temperature 97.7 F (36.5 C), resp. rate 15, height 5\' 5"  (1.651 m), weight 68.5 kg, SpO2 96%. Body mass index is 25.13 kg/m.   Treatment Plan Summary: Daily contact with patient to assess and evaluate symptoms and progress in treatment and Medication management  Patient is admitted to locked unit under safety precautions 2. Continue on Prozac 20 mg daily and Lamictal 100 mg by mouth twice daily, patient was advised to  watch for any rashes   3. Social worker consulted to get collateral and help with a safe discharge plan 4. Patient was encouraged to attend group and work on coping strategies and a safe discharge plan 5. Encouraged patient to stay sober and attend AA meetings upon discharge 6.  Patient was not started on Klonopin because she didn't have severe withdrawal symptoms  Lewanda Rife, MD

## 2023-08-23 NOTE — Group Note (Signed)
Date:  08/23/2023 Time:  8:49 PM  Group Topic/Focus:  Healthy Communication:   The focus of this group is to discuss communication, barriers to communication, as well as healthy ways to communicate with others.    Participation Level:  Active  Participation Quality:  Appropriate  Affect:  Appropriate  Cognitive:  Appropriate  Insight: Appropriate  Engagement in Group:  Engaged  Modes of Intervention:  Clarification and Discussion  Additional Comments:   Burt Ek 08/23/2023, 8:49 PM

## 2023-08-23 NOTE — Progress Notes (Signed)
Patient is a voluntary admission to [name] for MDD with SI statements.  Patient is calm and cooperative and pleasant to staff and peers.  C/o neck and shoulder pain and requested prn flexeril. Also states she has Lupus and has a rash around her hairline d/t that.  She spoke with physician about these problems.  Pt denies SI, HI, AVH, Depression and Anxiety.  Will continue to monitor.

## 2023-08-23 NOTE — Group Note (Signed)
Date:  08/23/2023 Time:  11:54 AM  Group Topic/Focus:  Managing Feelings:   The focus of this group is to identify what feelings patients have difficulty handling and develop a plan to handle them in a healthier way upon discharge.    Participation Level:  Active  Participation Quality:  Appropriate, Attentive, Sharing, and Supportive  Affect:  Appropriate  Cognitive:  Alert, Appropriate, and Oriented  Insight: Appropriate, Good, and Improving  Engagement in Group:  Engaged, Improving, and Supportive  Modes of Intervention:  Activity, Clarification, and Socialization  Additional Comments:     Alexis Frock 08/23/2023, 11:54 AM

## 2023-08-23 NOTE — Plan of Care (Signed)
  Problem: Health Behavior/Discharge Planning: Goal: Ability to manage health-related needs will improve Outcome: Progressing   Problem: Clinical Measurements: Goal: Will remain free from infection Outcome: Progressing   Problem: Activity: Goal: Risk for activity intolerance will decrease Outcome: Progressing   Problem: Nutrition: Goal: Adequate nutrition will be maintained Outcome: Progressing   Problem: Coping: Goal: Level of anxiety will decrease Outcome: Progressing   Problem: Skin Integrity: Goal: Risk for impaired skin integrity will decrease Outcome: Progressing   Problem: Education: Goal: Emotional status will improve Outcome: Progressing Goal: Mental status will improve Outcome: Progressing

## 2023-08-23 NOTE — Progress Notes (Signed)
   08/23/23 0600  15 Minute Checks  Location Bedroom  Visual Appearance Calm  Behavior Sleeping  Sleep (Behavioral Health Patients Only)  Calculate sleep? (Click Yes once per 24 hr at 0600 safety check) Yes  Documented sleep last 24 hours 11

## 2023-08-24 DIAGNOSIS — F332 Major depressive disorder, recurrent severe without psychotic features: Secondary | ICD-10-CM | POA: Diagnosis not present

## 2023-08-24 MED ORDER — TRIAMCINOLONE ACETONIDE 0.5 % EX CREA
TOPICAL_CREAM | Freq: Two times a day (BID) | CUTANEOUS | Status: DC
  Administered 2023-08-25: 1 via TOPICAL
  Filled 2023-08-24: qty 15

## 2023-08-24 NOTE — Plan of Care (Signed)
  Problem: Coping: Goal: Level of anxiety will decrease Outcome: Not Progressing   Problem: Pain Managment: Goal: General experience of comfort will improve Outcome: Not Progressing   

## 2023-08-24 NOTE — Progress Notes (Signed)
Patient admitted Sept 17, 2024 after going on an alcohol binge due to financial and employment stressors. Diagnosis of major depressive disorder.  Patient denies SI/HI/AVH. She endorses anxiety but denies depression. Tylenol and flexeril adm PRN for headache and muscle spasms. Upon follow up, medications were found to be effective.  Potential discharge tomorrow.  Q15 minute unit checks in place.

## 2023-08-24 NOTE — Progress Notes (Signed)
   08/24/23 2200  Psych Admission Type (Psych Patients Only)  Admission Status Voluntary  Psychosocial Assessment  Patient Complaints Worrying;Anxiety ("I am worried about getting a job when I leave here")  Eye Contact Fair  Facial Expression Anxious  Affect Appropriate to circumstance  Speech Logical/coherent  Interaction Assertive  Motor Activity Slow  Appearance/Hygiene In scrubs  Behavior Characteristics Cooperative  Mood Anxious;Pleasant  Thought Process  Coherency WDL  Content WDL  Delusions None reported or observed  Perception WDL  Hallucination None reported or observed  Judgment Impaired  Confusion None  Danger to Self  Current suicidal ideation? Denies  Agreement Not to Harm Self Yes  Description of Agreement verbal  Danger to Others  Danger to Others None reported or observed

## 2023-08-24 NOTE — BHH Counselor (Signed)
CSW contacted [employer] [facility] at [phone] to find therapist in network with pt's insurance  CSW was transferred by representative Erskine Squibb to the behavioral health line.   CSW was then transferred to behavioral health provider line [phone])   They state that the only provider they have in network with pt's insurance is [facility]  CSW stated that that is child's psychologist group and are unable to provide the services necessary for pt.   Insurance unable to provide provider's in network with [employer].   CSW will consult with coworkers and leadership regarding this    Reynaldo Minium, MSW, [facility] 08/24/2023 10:50 AM

## 2023-08-24 NOTE — Group Note (Signed)
Date:  08/24/2023 Time:  10:24 AM  Group Topic/Focus:  Self Care:   The focus of this group is to help patients understand the importance of self-care in order to improve or restore emotional, physical, spiritual, interpersonal, and financial health.    Participation Level:  Active  Participation Quality:  Appropriate  Affect:  Appropriate  Cognitive:  Appropriate  Insight: Appropriate  Engagement in Group:  Engaged  Modes of Intervention:  Activity and Discussion  Additional Comments:  None  Rodena Goldmann 08/24/2023, 10:24 AM

## 2023-08-24 NOTE — Group Note (Signed)
[facility] LCSW Group Therapy Note   Group Date: 08/24/2023 Start Time: 1315 End Time: 1400   Type of Therapy/Topic:  Group Therapy:  Balance in Life  Participation Level:  Did Not Attend   Description of Group:    This group will address the concept of balance and how it feels and looks when one is unbalanced. Patients will be encouraged to process areas in their lives that are out of balance, and identify reasons for remaining unbalanced. Facilitators will guide patients utilizing problem- solving interventions to address and correct the stressor making their life unbalanced. Understanding and applying boundaries will be explored and addressed for obtaining  and maintaining a balanced life. Patients will be encouraged to explore ways to assertively make their unbalanced needs known to significant others in their lives, using other group members and facilitator for support and feedback.  Therapeutic Goals: Patient will identify two or more emotions or situations they have that consume much of in their lives. Patient will identify signs/triggers that life has become out of balance:  Patient will identify two ways to set boundaries in order to achieve balance in their lives:  Patient will demonstrate ability to communicate their needs through discussion and/or role plays  Summary of Patient Progress:    X    Therapeutic Modalities:   Cognitive Behavioral Therapy Solution-Focused Therapy Assertiveness Training   Elza Rafter, LCSWA

## 2023-08-24 NOTE — Progress Notes (Addendum)
Patient states that upon arrival to the geropsych unit, she was told there could be up to 2 visitors per day. Patient says that she has a 57 year old mother that no longer drives and it doesn't make sense for her mother or the driver to have to stay in the car. She also indicates that in the adult unit workbook it has written that 2 patients can be allowed. Maintains that the workbook must be updated.

## 2023-08-24 NOTE — Group Note (Signed)
Date:  08/24/2023 Time:  8:21 PM  Group Topic/Focus:  Managing Feelings:   The focus of this group is to identify what feelings patients have difficulty handling and develop a plan to handle them in a healthier way upon discharge.    Participation Level:  Active  Participation Quality:  Appropriate  Affect:  Appropriate  Cognitive:  Appropriate  Insight: Appropriate  Engagement in Group:  Engaged  Modes of Intervention:  Clarification and Discussion  Additional Comments:    Burt Ek 08/24/2023, 8:21 PM

## 2023-08-24 NOTE — Progress Notes (Signed)
[facility] MD Progress Note  08/24/2023  [name]  MRN:  [id number]   [name] is a 57 y.o. female patient admitted with previous hx of Depression, Alcohol use disorder. ADHD and GAD came to the ER with complaint of SI x 3 days with a plan to OD on her pills.   Subjective: Case discussed in multidisciplinary team today.  Chart reviewed and patient seen during rounds.  Patient reports he is doing good today.  She had concerns about lupus rash.  Will order the cream requested by the patient for the rash.  Patient reports that her kids could not visit her yesterday, she is expecting them for visitation today on the unit.  Patient feels close to her baseline.  Patient reports she is working on healthy coping strategies and a safe discharge plan.  Patient denies thoughts of harming herself or others.  She denies manic or psychotic symptoms.  Principal Problem: Major depressive disorder, recurrent severe without psychotic features (HCC) Diagnosis: Principal Problem:   Major depressive disorder, recurrent severe without psychotic features (HCC)    Past Psychiatric History: hx of Depression, Alcohol use disorder. ADHD and GAD.  Reportedly,  suicide attempt 2022 when she combined Pine sol, Alcohol and Hydroxyzine and drank.  She was hospitalized at the time at [facility].  She was hospitalized at [facility]  in 2024 January.   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   History of anemia    Severe episode of recurrent major depressive disorder, without psychotic features (HCC) 01/07/2020   Sleep apnea    mild   SUI (stress  urinary incontinence, female)     Past Surgical History:  Procedure Laterality Date   BLADDER SUSPENSION N/A 02/13/2013   Procedure: [facility] SLING;  Surgeon: Anner Crete, MD;  Location: [facility];  Service: Urology;  Laterality: N/A;   CESAREAN SECTION  12-23-2004    W/ LEFT TUBAL LIGATION AND RIGHT SALPINGECTOMY   CESAREAN SECTION  1995   CYSTOSCOPY WITH STENT PLACEMENT Bilateral 06/16/2020   Procedure: CYSTOSCOPY WITH FIREFLY INJECTION;  Surgeon: Crista Elliot, MD;  Location: [facility];  Service: Urology;  Laterality: Bilateral;   IR RADIOLOGIST EVAL & MGMT  04/21/2020   IR RADIOLOGIST EVAL & MGMT  05/12/2020   ORIF HUMERUS FRACTURE Right 04/28/2014   Procedure: OPEN REDUCTION INTERNAL FIXATION (ORIF) PROXIMAL HUMERUS FRACTURE;  Surgeon: Mable Paris, MD;  Location: [facility];  Service: Orthopedics;  Laterality: Right;   ORIF PROXIMAL HUMERUS FRACTURE Right 04/28/2014   DR CHANDLER   PROCTOSCOPY N/A 06/16/2020   Procedure: RIGID PROCTOSCOPY;  Surgeon: Karie Soda, MD;  Location: [facility];  Service: General;  Laterality: N/A;   TUBAL LIGATION     Family History:  Family History  Adopted: Yes   Social History:  Social History   Substance and Sexual Activity  Alcohol Use Yes   Alcohol/week: 2.0 standard drinks of alcohol   Types: 2 Glasses of wine per week   Comment: 12/29/20 restarted      Social History  Substance and Sexual Activity  Drug Use No    Social History   Socioeconomic History   Marital status: Married    Spouse name: Not on file   Number of children: Not on file   Years of education: Not on file   Highest education level: Not on file  Occupational History   Not on file  Tobacco Use   Smoking status: Every Day    Current packs/day: 1.00    Average packs/day: 0.3 packs/day for 22.7 years (7.7 ttl pk-yrs)    Types: Cigarettes    Start date: 09/24/1999    Last attempt to quit: 09/24/2019   Smokeless tobacco: Never  Vaping Use    Vaping status: Every Day   Substances: Nicotine  Substance and Sexual Activity   Alcohol use: Yes    Alcohol/week: 2.0 standard drinks of alcohol    Types: 2 Glasses of wine per week    Comment: 12/29/20 restarted    Drug use: No   Sexual activity: Not Currently    Birth control/protection: Post-menopausal  Other Topics Concern   Not on file  Social History Narrative   Not on file   Social Determinants of Health   Financial Resource Strain: High Risk (07/26/2023)   Received from [facility]   Overall Financial Resource Strain (CARDIA)    Difficulty of Paying Living Expenses: Very hard  Food Insecurity: No Food Insecurity (08/22/2023)   Hunger Vital Sign    Worried About Running Out of Food in the Last Year: Never true    Ran Out of Food in the Last Year: Never true  Recent Concern: Food Insecurity - Food Insecurity Present (07/26/2023)   Received from [facility]   Hunger Vital Sign    Worried About Running Out of Food in the Last Year: Sometimes true    Ran Out of Food in the Last Year: Sometimes true  Transportation Needs: No Transportation Needs (08/22/2023)   PRAPARE - Administrator, Civil Service (Medical): No    Lack of Transportation (Non-Medical): No  Physical Activity: Not on file  Stress: Not on file  Social Connections: Not on file   Additional Social History:                         Sleep: Fair  Appetite:  Fair  Current Medications: Current Facility-Administered Medications  Medication Dose Route Frequency Provider Last Rate Last Admin   acetaminophen (TYLENOL) tablet 650 mg  650 mg Oral Q6H PRN Dahlia Byes C, NP   650 mg at 08/24/23 1038   albuterol (PROVENTIL) (2.5 MG/3ML) 0.083% nebulizer solution 3 mL  3 mL Nebulization TID PRN Earney Navy, NP       alum & mag hydroxide-simeth (MAALOX/MYLANTA) 200-200-20 MG/5ML suspension 30 mL  30 mL Oral Q4H PRN Onuoha, Josephine C, NP        haloperidol (HALDOL) tablet 5 mg  5 mg Oral Q6H PRN Onuoha, Josephine C, NP       And   benztropine (COGENTIN) tablet 1 mg  1 mg Oral Q6H PRN Dahlia Byes C, NP       cyclobenzaprine (FLEXERIL) tablet 5 mg  5 mg Oral TID PRN Lewanda Rife, MD   5 mg at 08/24/23 1038   feeding supplement (ENSURE ENLIVE / ENSURE PLUS) liquid 237 mL  237 mL Oral BID BM Lewanda Rife, MD   237 mL at 08/23/23 0915   FLUoxetine (  PROZAC) capsule 20 mg  20 mg Oral Daily Dahlia Byes C, NP   20 mg at 08/24/23 1038   folic acid (FOLVITE) tablet 1 mg  1 mg Oral Daily Dahlia Byes C, NP   1 mg at 08/24/23 1038   gabapentin (NEURONTIN) capsule 600 mg  600 mg Oral BID Rankin, Shuvon B, NP   600 mg at 08/24/23 1038   hydrOXYzine (ATARAX) tablet 25 mg  25 mg Oral Q6H PRN Dahlia Byes C, NP   25 mg at 08/23/23 2119   lamoTRIgine (LAMICTAL) tablet 100 mg  100 mg Oral BID Dahlia Byes C, NP   100 mg at 08/24/23 1038   LORazepam (ATIVAN) tablet 1 mg  1 mg Oral Q8H PRN Earney Navy, NP       multivitamin with minerals tablet 1 tablet  1 tablet Oral Daily Dahlia Byes C, NP   1 tablet at 08/24/23 1038   nicotine (NICODERM CQ - dosed in mg/24 hours) patch 14 mg  14 mg Transdermal Daily Lewanda Rife, MD   14 mg at 08/24/23 1039   propranolol (INDERAL) tablet 10 mg  10 mg Oral TID PRN Earney Navy, NP       SUMAtriptan (IMITREX) tablet 100 mg  100 mg Oral Q2H PRN Dahlia Byes C, NP       thiamine (VITAMIN B1) tablet 100 mg  100 mg Oral Daily Onuoha, Josephine C, NP   100 mg at 08/24/23 1038   traZODone (DESYREL) tablet 50 mg  50 mg Oral QHS PRN Rankin, Shuvon B, NP   50 mg at 08/23/23 2119   triamcinolone cream (KENALOG) 0.5 %   Topical BID Lewanda Rife, MD        Lab Results:  No results found for this or any previous visit (from the past 48 hour(s)).   Blood Alcohol level:  Lab Results  Component Value Date   ETH 214 (H) 08/20/2023   ETH 309 (HH) 04/09/2023     Metabolic Disorder Labs: Lab Results  Component Value Date   HGBA1C 5.2 12/25/2022   MPG 102.54 12/25/2022   MPG 114.02 01/06/2021   No results found for: "PROLACTIN" Lab Results  Component Value Date   CHOL 223 (H) 12/25/2022   TRIG 57 12/25/2022   HDL 128 12/25/2022   CHOLHDL 1.7 12/25/2022   VLDL 11 12/25/2022   LDLCALC 84 12/25/2022   LDLCALC 82 01/06/2021    Physical Findings: AIMS:  , ,  ,  ,    CIWA:  CIWA-Ar Total: 1 COWS:     Musculoskeletal: Strength & Muscle Tone: within normal limits Gait & Station: normal Patient leans: N/A   Psychiatric Specialty Exam:   Presentation  General Appearance:  Appropriate for Environment   Eye Contact: Fair   Speech: Clear and Coherent   Speech Volume: Normal   Handedness: Right     Mood and Affect  Mood: Better   Affect: Congruent; more animated     Thought Process  Thought Processes: Coherent; Goal Directed   Descriptions of Associations:Intact   Orientation:Full (Time, Place and Person)   Thought Content:Abstract Reasoning   History of Schizophrenia/Schizoaffective disorder:No   Duration of Psychotic Symptoms:N/A   Hallucinations:Hallucinations: None   Ideas of Reference:None   Suicidal Thoughts:Suicidal Thoughts: Denies suicidal thoughts   Homicidal Thoughts:Homicidal Thoughts: No     Sensorium  Memory: Immediate Fair; Remote Fair   Judgment: Improved   Insight: Improved     Executive Functions  Concentration: Fair  Attention Span: Fair   Recall: [employer] of Knowledge: Fair   Language: Fair     Psychomotor Activity  Psychomotor Activity: Psychomotor Activity: Normal     Assets  Assets: Manufacturing systems engineer; Desire for Improvement; Housing; Physical Health     Sleep  Sleep: Sleep: Fair       Physical Exam: Physical Exam Constitutional:      Appearance: Normal appearance.  HENT:     Head: Normocephalic and atraumatic.     Nose: Nose  normal.  Eyes:     Pupils: Pupils are equal, round, and reactive to light.  Cardiovascular:     Rate and Rhythm: Normal rate.  Pulmonary:     Effort: Pulmonary effort is normal.  Skin:    General: Skin is warm.  Neurological:     General: No focal deficit present.     Mental Status: She is alert and oriented to person, place, and time.      Review of Systems  Constitutional:  Negative for chills and fever.  HENT:  Negative for congestion and sore throat.   Eyes:  Negative for double vision.  Respiratory:  Negative for cough and shortness of breath.   Cardiovascular:  Negative for chest pain and palpitations.  Gastrointestinal:  Negative for nausea and vomiting.  Neurological:  Negative for dizziness and speech change.  .     Blood pressure 126/66, pulse 74, temperature 98 F (36.7 C), resp. rate 18, height 5\' 5"  (1.651 m), weight 68.5 kg, SpO2 96%. Body mass index is 25.13 kg/m.   Treatment Plan Summary: Daily contact with patient to assess and evaluate symptoms and progress in treatment and Medication management  Patient is admitted to locked unit under safety precautions 2. Continue on Prozac 20 mg daily and Lamictal 100 mg by mouth twice daily, patient was advised to watch for any rashes   3. Social worker consulted to get collateral and help with a safe discharge plan 4. Patient was encouraged to attend group and work on coping strategies and a safe discharge plan 5. Encouraged patient to stay sober and attend AA meetings upon discharge 6.  Patient was not started on Klonopin because she didn't have severe withdrawal symptoms 7.  Ordered triamcinolone cream (KENALOG) 0.5 %,   Lewanda Rife, MD

## 2023-08-24 NOTE — Plan of Care (Signed)
  Problem: Education: Goal: Knowledge of General Education information will improve Description: Including pain rating scale, medication(s)/side effects and non-pharmacologic comfort measures Outcome: Progressing   Problem: Health Behavior/Discharge Planning: Goal: Ability to manage health-related needs will improve Outcome: Progressing   Problem: Clinical Measurements: Goal: Ability to maintain clinical measurements within normal limits will improve Outcome: Progressing Goal: Will remain free from infection Outcome: Progressing Goal: Diagnostic test results will improve Outcome: Progressing Goal: Respiratory complications will improve Outcome: Progressing Goal: Cardiovascular complication will be avoided Outcome: Progressing   Problem: Activity: Goal: Risk for activity intolerance will decrease Outcome: Progressing   Problem: Nutrition: Goal: Adequate nutrition will be maintained Outcome: Progressing   Problem: Coping: Goal: Level of anxiety will decrease Outcome: Progressing   Problem: Elimination: Goal: Will not experience complications related to bowel motility Outcome: Progressing Goal: Will not experience complications related to urinary retention Outcome: Progressing   Problem: Pain Managment: Goal: General experience of comfort will improve Outcome: Progressing   Problem: Safety: Goal: Ability to remain free from injury will improve Outcome: Progressing   Problem: Skin Integrity: Goal: Risk for impaired skin integrity will decrease Outcome: Progressing   Problem: Education: Goal: Knowledge of [place] [facility] Education information/materials will improve Outcome: Progressing Goal: Emotional status will improve Outcome: Progressing Goal: Mental status will improve Outcome: Progressing Goal: Verbalization of understanding the information provided will improve Outcome: Progressing   Problem: Activity: Goal: Interest or engagement in activities will  improve Outcome: Progressing Goal: Sleeping patterns will improve Outcome: Progressing   Problem: Coping: Goal: Ability to verbalize frustrations and anger appropriately will improve Outcome: Progressing Goal: Ability to demonstrate self-control will improve Outcome: Progressing   Problem: Health Behavior/Discharge Planning: Goal: Identification of resources available to assist in meeting health care needs will improve Outcome: Progressing Goal: Compliance with treatment plan for underlying cause of condition will improve Outcome: Progressing   Problem: Physical Regulation: Goal: Ability to maintain clinical measurements within normal limits will improve Outcome: Progressing   Problem: Safety: Goal: Periods of time without injury will increase Outcome: Progressing   Problem: Education: Goal: Knowledge of disease or condition will improve Outcome: Progressing Goal: Understanding of discharge needs will improve Outcome: Progressing   Problem: Health Behavior/Discharge Planning: Goal: Ability to identify changes in lifestyle to reduce recurrence of condition will improve Outcome: Progressing Goal: Identification of resources available to assist in meeting health care needs will improve Outcome: Progressing   Problem: Physical Regulation: Goal: Complications related to the disease process, condition or treatment will be avoided or minimized Outcome: Progressing   Problem: Safety: Goal: Ability to remain free from injury will improve Outcome: Progressing

## 2023-08-25 DIAGNOSIS — F332 Major depressive disorder, recurrent severe without psychotic features: Secondary | ICD-10-CM | POA: Diagnosis not present

## 2023-08-25 MED ORDER — TRAZODONE HCL 50 MG PO TABS: 50.0000 mg | ORAL_TABLET | Freq: Every day | ORAL | 0 refills | Status: DC

## 2023-08-25 MED ORDER — TRIAMCINOLONE ACETONIDE 0.5 % EX CREA: TOPICAL_CREAM | Freq: Two times a day (BID) | CUTANEOUS | 0 refills | Status: AC

## 2023-08-25 MED ORDER — HYDROXYZINE HCL 25 MG PO TABS: 25.0000 mg | ORAL_TABLET | Freq: Four times a day (QID) | ORAL | 0 refills | Status: DC | PRN

## 2023-08-25 MED ORDER — CYCLOBENZAPRINE HCL 5 MG PO TABS: 5.0000 mg | ORAL_TABLET | Freq: Three times a day (TID) | ORAL | 0 refills | Status: DC | PRN

## 2023-08-25 MED ORDER — NICOTINE 14 MG/24HR TD PT24: 14.0000 mg | MEDICATED_PATCH | Freq: Every day | TRANSDERMAL | 0 refills | Status: DC

## 2023-08-25 MED ORDER — PROPRANOLOL HCL 10 MG PO TABS: 10.0000 mg | ORAL_TABLET | Freq: Three times a day (TID) | ORAL | 0 refills | Status: DC | PRN

## 2023-08-25 MED ORDER — LAMOTRIGINE 100 MG PO TABS: 100.0000 mg | ORAL_TABLET | Freq: Two times a day (BID) | ORAL | 0 refills | Status: DC

## 2023-08-25 MED ORDER — GABAPENTIN 300 MG PO CAPS: 600.0000 mg | ORAL_CAPSULE | Freq: Two times a day (BID) | ORAL | 0 refills | Status: DC

## 2023-08-25 NOTE — Discharge Summary (Signed)
Physician Discharge Summary Note  Patient:  [name] is an 57 y.o., female MRN:  [id number] DOB:  Dec 15, 1965 Patient phone:  [phone] (home)  Patient address:   [phone] [place] [address],    Date of Admission:  08/21/2023 Date of Discharge: 08/25/2023  Reason for Admission:  [name] is a 57 y.o. female patient admitted with previous hx of Depression, Alcohol use disorder. ADHD and GAD came to the ER with complaint of SI x 3 days with a plan to OD on her pills.   Principal Problem: Major depressive disorder, recurrent severe without psychotic features [facility]) Discharge Diagnoses: Principal Problem:   Major depressive disorder, recurrent severe without psychotic features (HCC)   Past Psychiatric History: hx of Depression, Alcohol use disorder. ADHD and GAD.  Reportedly,  suicide attempt 2022 when she combined Pine sol, Alcohol and Hydroxyzine and drank.  She was hospitalized at the time at [facility].  She was hospitalized at [facility]  in 2024 January.   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   History of anemia    Severe episode of recurrent major depressive disorder, without psychotic features (HCC) 01/07/2020   Sleep apnea    mild   SUI (stress urinary incontinence, female)     Past Surgical History:  Procedure Laterality Date   BLADDER SUSPENSION N/A 02/13/2013   Procedure: [facility] SLING;  Surgeon: Anner Crete, MD;  Location: [facility];  Service: Urology;  Laterality: N/A;   CESAREAN SECTION  12-23-2004    W/ LEFT TUBAL LIGATION AND RIGHT SALPINGECTOMY   CESAREAN SECTION   1995   CYSTOSCOPY WITH STENT PLACEMENT Bilateral 06/16/2020   Procedure: CYSTOSCOPY WITH FIREFLY INJECTION;  Surgeon: Crista Elliot, MD;  Location: [facility];  Service: Urology;  Laterality: Bilateral;   IR RADIOLOGIST EVAL & MGMT  04/21/2020   IR RADIOLOGIST EVAL & MGMT  05/12/2020   ORIF HUMERUS FRACTURE Right 04/28/2014   Procedure: OPEN REDUCTION INTERNAL FIXATION (ORIF) PROXIMAL HUMERUS FRACTURE;  Surgeon: Mable Paris, MD;  Location: [facility];  Service: Orthopedics;  Laterality: Right;   ORIF PROXIMAL HUMERUS FRACTURE Right 04/28/2014   DR CHANDLER   PROCTOSCOPY N/A 06/16/2020   Procedure: RIGID PROCTOSCOPY;  Surgeon: Karie Soda, MD;  Location: [facility];  Service: General;  Laterality: N/A;   TUBAL LIGATION     Family History:  Family History  Adopted: Yes    Social History:  Social History   Substance and Sexual Activity  Alcohol Use Yes   Alcohol/week: 2.0 standard drinks of alcohol   Types: 2 Glasses of wine per week   Comment: 12/29/20 restarted      Social History   Substance and Sexual Activity  Drug Use No    Social History   Socioeconomic History   Marital status: Married    Spouse name: Not on file   Number of children: Not on file   Years of education: Not on file   Highest education level: Not on file  Occupational History   Not on  file  Tobacco Use   Smoking status: Every Day    Current packs/day: 1.00    Average packs/day: 0.3 packs/day for 22.7 years (7.7 ttl pk-yrs)    Types: Cigarettes    Start date: 09/24/1999    Last attempt to quit: 09/24/2019   Smokeless tobacco: Never  Vaping Use   Vaping status: Every Day   Substances: Nicotine  Substance and Sexual Activity   Alcohol use: Yes    Alcohol/week: 2.0 standard drinks of alcohol    Types: 2 Glasses of wine per week    Comment: 12/29/20 restarted    Drug use: No   Sexual activity: Not Currently    Birth control/protection: Post-menopausal  Other Topics Concern   Not on file   Social History Narrative   Not on file   Social Determinants of Health   Financial Resource Strain: High Risk (07/26/2023)   Received from [facility]   Overall Financial Resource Strain (CARDIA)    Difficulty of Paying Living Expenses: Very hard  Food Insecurity: No Food Insecurity (08/22/2023)   Hunger Vital Sign    Worried About Running Out of Food in the Last Year: Never true    Ran Out of Food in the Last Year: Never true  Recent Concern: Food Insecurity - Food Insecurity Present (07/26/2023)   Received from [facility]   Hunger Vital Sign    Worried About Running Out of Food in the Last Year: Sometimes true    Ran Out of Food in the Last Year: Sometimes true  Transportation Needs: No Transportation Needs (08/22/2023)   PRAPARE - Administrator, Civil Service (Medical): No    Lack of Transportation (Non-Medical): No  Physical Activity: Not on file  Stress: Not on file  Social Connections: Not on file    Hospital Course: The patient was admitted to Inpatient psychiatric treatment for stabilization of depression and suicidal thoughts. Patient was placed on suicidal precautions. The patient was evaluated and treated by the multidisciplinary treatment team including physicians, nurses, social workers and therapists. All medications were presented to the patient and the Patient gave consent to all the medications that they were given, as well as was explained the risks, benefits, side effects and alternatives of all medication therapies. The patient was integrated into the general milieu on the ward and encouraged to attend to her ADLs and participate in all groups and activities. During hospital course the Patient attended coping skill groups, music therapy and activity therapy groups. Patient was counseled on cognitive techniques/skills by multiple staff members and given support care by the staff.  Patient's medication regimen was  evaluated and titrated to therapeutic levels to better Patient's overall daily functioning. Specifically, the patient was started on Prozac and Lamictal.  She tolerated the medication well with no significant side effects.  During the hospitalization, the patient demonstrated a stabilization of mood with  decreased impulsivity, improved sleep and appetite. At the time of discharge, the patient denied any suicidal ideation/homicidal ideation and was not overtly depressed, manic or psychotic.  She felt hopeful.  Patient was able to identify a safety plan to include speaking with family, contacting outpatient provider or calling 911 if thoughts of self-harm or suicide return. Patient was counselled on outpatient follow-up that was arranged prior to discharge.  Musculoskeletal: Strength & Muscle Tone: within normal limits Gait & Station: normal Patient leans: N/A   Psychiatric Specialty Exam:   Presentation  General Appearance:  Appropriate for  Environment   Eye Contact: Fair   Speech: Clear and Coherent   Speech Volume: Normal   Handedness: Right     Mood and Affect  Mood: "Good"   Affect: Congruent; more animated     Thought Process  Thought Processes: Coherent; Goal Directed   Descriptions of Associations:Intact   Orientation:Full (Time, Place and Person)   Thought Content:Abstract Reasoning   History of Schizophrenia/Schizoaffective disorder:No   Duration of Psychotic Symptoms:N/A   Hallucinations:Hallucinations: None   Ideas of Reference:None   Suicidal Thoughts:Suicidal Thoughts: Denies suicidal thoughts, no intentions or plans   Homicidal Thoughts:Homicidal Thoughts: Denies, no intentions or plans     Sensorium  Memory: Immediate Fair; Remote Fair   Judgment: Improved   Insight: Improved     Executive Functions  Concentration: Fair   Attention Span: Fair   Recall: [employer] of Knowledge: Fair   Language: Fair     Psychomotor  Activity  Psychomotor Activity: Psychomotor Activity: Normal     Agricultural engineer; Desire for Improvement; Housing; Physical Health     Sleep  Sleep: Fair       Physical Exam:  Constitutional:      Appearance: Normal appearance.  HENT:     Head: Normocephalic and atraumatic.     Nose: Nose normal.  Eyes:     Pupils: Pupils are equal, round, and reactive to light.  Cardiovascular:     Rate and Rhythm: Normal rate.  Pulmonary:     Effort: Pulmonary effort is normal.  Skin:    General: Skin is warm.  Neurological:     General: No focal deficit present.     Mental Status: She is alert and oriented to person, place, and time.      Review of Systems  Constitutional:  Negative for chills and fever.  HENT:  Negative for congestion and sore throat.   Eyes:  Negative for double vision.  Respiratory:  Negative for cough and shortness of breath.   Cardiovascular:  Negative for chest pain and palpitations.  Gastrointestinal:  Negative for nausea and vomiting.  Neurological:  Negative for dizziness and speech change.   Blood pressure 110/73, pulse 69, temperature 97.7 F (36.5 C), resp. rate 17, height 5\' 5"  (1.651 m), weight 68.5 kg, SpO2 97%. Body mass index is 25.13 kg/m.   Social History   Tobacco Use  Smoking Status Every Day   Current packs/day: 1.00   Average packs/day: 0.3 packs/day for 22.7 years (7.7 ttl pk-yrs)   Types: Cigarettes   Start date: 09/24/1999   Last attempt to quit: 09/24/2019  Smokeless Tobacco Never   Tobacco Cessation:  N/A, patient does not currently use tobacco products   Blood Alcohol level:  Lab Results  Component Value Date   ETH 214 (H) 08/20/2023   ETH 309 (HH) 04/09/2023    Metabolic Disorder Labs:  Lab Results  Component Value Date   HGBA1C 5.2 12/25/2022   MPG 102.54 12/25/2022   MPG 114.02 01/06/2021   No results found for: "PROLACTIN" Lab Results  Component Value Date   CHOL 223 (H) 12/25/2022   TRIG  57 12/25/2022   HDL 128 12/25/2022   CHOLHDL 1.7 12/25/2022   VLDL 11 12/25/2022   LDLCALC 84 12/25/2022   LDLCALC 82 01/06/2021    See Psychiatric Specialty Exam and Suicide Risk Assessment completed by Attending Physician prior to discharge.  Discharge destination:  Home  Is patient on multiple antipsychotic therapies at discharge:  No  Recommended Plan for Multiple Antipsychotic Therapies: NA   Allergies as of 08/25/2023       Reactions   Codeine Nausea And Vomiting, Other (See Comments)   Dizziness, also   Hydrocodone Other (See Comments)   Dizzy; Tolerates oxycodone fine        Medication List     STOP taking these medications    acamprosate 333 MG tablet Commonly known as: CAMPRAL   busPIRone 15 MG tablet Commonly known as: BUSPAR   cloNIDine 0.1 MG tablet Commonly known as: CATAPRES   doxycycline 100 MG EC tablet Commonly known as: DORYX   Dupixent 300 MG/2ML Sopn Generic drug: Dupilumab   EPINEPHrine 0.3 mg/0.3 mL Soaj injection Commonly known as: EPI-PEN   FLUoxetine 40 MG capsule Commonly known as: PROZAC   folic acid 1 MG tablet Commonly known as: FOLVITE   gabapentin 600 MG tablet Commonly known as: NEURONTIN Replaced by: gabapentin 300 MG capsule   MAGNESIUM PO   montelukast 10 MG tablet Commonly known as: SINGULAIR   NICODERM CQ TD   Nicorette 2 MG gum Generic drug: nicotine polacrilex   Nicorette 2 MG lozenge Generic drug: nicotine polacrilex   nicotine 7 mg/24hr patch Commonly known as: NICODERM CQ - dosed in mg/24 hr Replaced by: nicotine 14 mg/24hr patch   progesterone 100 MG capsule Commonly known as: PROMETRIUM       TAKE these medications      Indication  albuterol 1.25 MG/3ML nebulizer solution Commonly known as: ACCUNEB Take 1 ampule by nebulization 3 (three) times daily as needed for wheezing or shortness of breath. What changed: Another medication with the same name was removed. Continue taking this  medication, and follow the directions you see here.    Anoro Ellipta 62.5-25 MCG/ACT Aepb Generic drug: umeclidinium-vilanterol Inhale 1 puff into the lungs daily.    BC HEADACHE POWDER PO Take 1 packet by mouth as needed (for headaches or pain).    cyclobenzaprine 5 MG tablet Commonly known as: FLEXERIL Take 1 tablet (5 mg total) by mouth 3 (three) times daily as needed for muscle spasms.    fluticasone 50 MCG/ACT nasal spray Commonly known as: FLONASE Place 1 spray into both nostrils 2 (two) times daily.    gabapentin 300 MG capsule Commonly known as: NEURONTIN Take 2 capsules (600 mg total) by mouth 2 (two) times daily. Replaces: gabapentin 600 MG tablet    hydrOXYzine 25 MG tablet Commonly known as: ATARAX Take 1 tablet (25 mg total) by mouth every 6 (six) hours as needed (anxiety/agitation or CIWA < or = 10).  Indication: Feeling Anxious   lamoTRIgine 100 MG tablet Commonly known as: LAMICTAL Take 1 tablet (100 mg total) by mouth 2 (two) times daily. What changed: when to take this    multivitamin with minerals Tabs tablet Take 1 tablet by mouth daily.    nicotine 14 mg/24hr patch Commonly known as: NICODERM CQ - dosed in mg/24 hours Place 1 patch (14 mg total) onto the skin daily. Start taking on: August 26, 2023 Replaces: nicotine 7 mg/24hr patch    propranolol 10 MG tablet Commonly known as: INDERAL Take 1 tablet (10 mg total) by mouth 3 (three) times daily as needed (anxiety). What changed:  when to take this reasons to take this  Indication: Feeling Anxious   QC TUMERIC COMPLEX PO Take 2 capsules by mouth daily.    SUMAtriptan 100 MG tablet Commonly known as: IMITREX Take 1 tablet (100 mg total) by mouth every  2 (two) hours as needed for migraine. May repeat in 2 hours if headache persists or recurs. Do not exceed 2 tablets within a 24 hour period. What changed:  reasons to take this additional instructions    thiamine 100 MG tablet Commonly  known as: Vitamin B-1 Take 1 tablet (100 mg total) by mouth daily.  Indication: Deficiency of Vitamin B1   traZODone 50 MG tablet Commonly known as: DESYREL Take 1 tablet (50 mg total) by mouth at bedtime.  Indication: Trouble Sleeping   triamcinolone cream 0.5 % Commonly known as: KENALOG Apply topically 2 (two) times daily.         Follow-up Information     [facility] [place], [employer]. Go to.   Specialty: Professional Counselor Why: Although you declined a scheduled follow-up due to financial reasons, I am including walk-in hours for [facility] of the [place]. They provide sliding scale fees based on income and if you happen to lose your insurance they serve folks without insurance as well.   If you want to talk to someone about a mental health or substance abuse issue we offer walk-in clinics in [place] and [employer]:  The [facility] [address], [place], [place] [address] Monday - Friday: 8:30 a.m.-12 p.m. / 1-2:30 p.m.  The [facility] [address] [place], [place] [address] Monday-Friday: 8:30 a.m.-12 p.m. / 1-2:30 p.m.  Alternatively, call Family Service at [phone] (select option 5), or email [email or web address]. Contact information: [facility] [place] [address] [place] [address] [phone]                 PATIENTS CONDITION AT DISCHARGE:  Stable  TOBACCO CESSATION SCREENING  Patient was screened and counselled on smoking cessation at time of discharge.   PRESCRIPTION ARE LOCATED:  On Chart  DISCHARGE INSTRUCTIONS:  1. Diet: Cardiac healthy  2. Activity: As tolerated  3. Take medications as prescribed and not to make any changes without first consulting with the outpatient provider.  4. Patient was advised to avoid any illicit drugs or alcohol due to negative impact on physical and mental health.  5. Patient should keep all follow up appointments.  TIME SPENT ON  DISCHARGE: Over 35 minutes were spent on this patients discharge including a face to face encounter, patient counseling and preparation of discharge materials.    Signed:   Lewanda Rife, MD

## 2023-08-25 NOTE — Progress Notes (Signed)
  [facility] Adult Case Management Discharge Plan :  Will you be returning to the same living situation after discharge:  Yes,  [address]  At discharge, do you have transportation home?: Yes,  mom or daughter will pick up Do you have the ability to pay for your medications: Yes,  [employer]  Release of information consent forms completed and in the chart;  Patient's signature needed at discharge.  Patient to Follow up at:  Follow-up Information     [facility] [place], [employer]. Go to.   Specialty: Professional Counselor Why: Although you declined a scheduled follow-up due to financial reasons, I am including walk-in hours for [facility] of the [place]. They provide sliding scale fees based on income and if you happen to lose your insurance they serve folks without insurance as well.   If you want to talk to someone about a mental health or substance abuse issue we offer walk-in clinics in [place] and [employer]:  The [facility] [address], [place], [place] [address] Monday - Friday: 8:30 a.m.-12 p.m. / 1-2:30 p.m.  The [facility] [address], [place], [place] [address] Monday-Friday: 8:30 a.m.-12 p.m. / 1-2:30 p.m.  Alternatively, call Family Service at [phone] (select option 5), or email [email or web address]. Contact information: [facility] [place] [address] [place] [address] [phone]                 Next level of care provider has access to [facility] Link:yes  Safety Planning and Suicide Prevention discussed: Yes,  with patient      Has patient been referred to the Quitline?: Patient refused referral for treatment  Patient has been referred for addiction treatment: Yes, referral information given but appointment not made [facility] [place] (list facility).  Marshell Levan, LCSW 08/25/2023, 10:26 AM

## 2023-08-25 NOTE — [facility] Suicide Risk Assessment (Signed)
[facility] Discharge Suicide Risk Assessment   Principal Problem: Major depressive disorder, recurrent severe without psychotic features (HCC) Discharge Diagnoses: Principal Problem:   Major depressive disorder, recurrent severe without psychotic features (HCC)   Musculoskeletal: Strength & Muscle Tone: within normal limits Gait & Station: normal Patient leans: N/A   Psychiatric Specialty Exam:   Presentation  General Appearance:  Appropriate for Environment   Eye Contact: Fair   Speech: Clear and Coherent   Speech Volume: Normal   Handedness: Right     Mood and Affect  Mood: "Good"   Affect: Congruent; more animated     Thought Process  Thought Processes: Coherent; Goal Directed   Descriptions of Associations:Intact   Orientation:Full (Time, Place and Person)   Thought Content:Abstract Reasoning   History of Schizophrenia/Schizoaffective disorder:No   Duration of Psychotic Symptoms:N/A   Hallucinations:Hallucinations: None   Ideas of Reference:None   Suicidal Thoughts:Suicidal Thoughts: Denies suicidal thoughts, no intentions or plans   Homicidal Thoughts:Homicidal Thoughts: Denies, no intentions or plans     Sensorium  Memory: Immediate Fair; Remote Fair   Judgment: Improved   Insight: Improved     Executive Functions  Concentration: Fair   Attention Span: Fair   Recall: [employer] of Knowledge: Fair   Language: Fair     Psychomotor Activity  Psychomotor Activity: Psychomotor Activity: Normal     Assets  Assets: Communication Skills; Desire for Improvement; Housing; Physical Health     Sleep  Sleep: Sleep: Fair       Physical Exam: Physical Exam Constitutional:      Appearance: Normal appearance.  HENT:     Head: Normocephalic and atraumatic.     Nose: Nose normal.  Eyes:     Pupils: Pupils are equal, round, and reactive to light.  Cardiovascular:     Rate and Rhythm: Normal rate.  Pulmonary:      Effort: Pulmonary effort is normal.  Skin:    General: Skin is warm.  Neurological:     General: No focal deficit present.     Mental Status: She is alert and oriented to person, place, and time.      Review of Systems  Constitutional:  Negative for chills and fever.  HENT:  Negative for congestion and sore throat.   Eyes:  Negative for double vision.  Respiratory:  Negative for cough and shortness of breath.   Cardiovascular:  Negative for chest pain and palpitations.  Gastrointestinal:  Negative for nausea and vomiting.  Neurological:  Negative for dizziness and speech change.   Blood pressure 110/73, pulse 69, temperature 97.7 F (36.5 C), resp. rate 17, height 5\' 5"  (1.651 m), weight 68.5 kg, SpO2 97%. Body mass index is 25.13 kg/m.    Demographic Factors:  Caucasian, Living alone, and Unemployed  Loss Factors: Financial problems/change in socioeconomic status  Historical Factors: Prior suicide attempts and Impulsivity  Risk Reduction Factors:   Positive social support, Positive therapeutic relationship, and Positive coping skills or problem solving skills  Continued Clinical Symptoms:  Previous Psychiatric Diagnoses and Treatments  Cognitive Features That Contribute To Risk:  Polarized thinking and Thought constriction (tunnel vision)    Suicide Risk:  Minimal: No identifiable suicidal ideation.   Follow-up Information     [facility] [place], [employer]. Go to.   Specialty: Professional Counselor Why: Although you declined a scheduled follow-up due to financial reasons, I am including walk-in hours for [facility] of the [place]. They provide sliding scale fees based on  income and if you happen to lose your insurance they serve folks without insurance as well.   If you want to talk to someone about a mental health or substance abuse issue we offer walk-in clinics in [place] and [employer]:  The [facility] [address]  [place], [place] [address] Monday - Friday: 8:30 a.m.-12 p.m. / 1-2:30 p.m.  The [facility] [address] [place], [place] [address] Monday-Friday: 8:30 a.m.-12 p.m. / 1-2:30 p.m.  Alternatively, call Family Service at [phone] (select option 5), or email [email or web address]. Contact information: [facility] [place] [address] [place] [address] [phone]                 Plan Of Care/Follow-up recommendations:  Per Discharge Summary  Lewanda Rife, MD

## 2023-08-25 NOTE — Progress Notes (Signed)
Patient ID: [name], female   DOB: 1966/02/11, 57 y.o.   MRN: [id number]  Patient was discharged from [facility] unit at approx 1115 escorted by staff. Patient denies SI/HI/AVH. Discharge packet to include printed AVS, Suicide Risk Assessment, and Transition Record reviewed with patient. Belongings to include cellphone and jewelry returned and patient verified receipt with signature. Suicide safety plan completed with a copy kept in chart.

## 2023-08-25 NOTE — Plan of Care (Signed)
  Problem: Education: Goal: Knowledge of General Education information will improve Description: Including pain rating scale, medication(s)/side effects and non-pharmacologic comfort measures Outcome: Adequate for Discharge   Problem: Health Behavior/Discharge Planning: Goal: Ability to manage health-related needs will improve Outcome: Adequate for Discharge   Problem: Clinical Measurements: Goal: Ability to maintain clinical measurements within normal limits will improve Outcome: Adequate for Discharge Goal: Will remain free from infection Outcome: Adequate for Discharge Goal: Diagnostic test results will improve Outcome: Adequate for Discharge Goal: Respiratory complications will improve Outcome: Adequate for Discharge Goal: Cardiovascular complication will be avoided Outcome: Adequate for Discharge   Problem: Activity: Goal: Risk for activity intolerance will decrease Outcome: Adequate for Discharge   Problem: Nutrition: Goal: Adequate nutrition will be maintained Outcome: Adequate for Discharge   Problem: Coping: Goal: Level of anxiety will decrease Outcome: Adequate for Discharge   Problem: Elimination: Goal: Will not experience complications related to bowel motility Outcome: Adequate for Discharge Goal: Will not experience complications related to urinary retention Outcome: Adequate for Discharge   Problem: Pain Managment: Goal: General experience of comfort will improve Outcome: Adequate for Discharge   Problem: Safety: Goal: Ability to remain free from injury will improve Outcome: Adequate for Discharge   Problem: Skin Integrity: Goal: Risk for impaired skin integrity will decrease Outcome: Adequate for Discharge   Problem: Education: Goal: Knowledge of [place] [facility] Education information/materials will improve Outcome: Adequate for Discharge Goal: Emotional status will improve Outcome: Adequate for Discharge Goal: Mental status will  improve Outcome: Adequate for Discharge Goal: Verbalization of understanding the information provided will improve Outcome: Adequate for Discharge   Problem: Activity: Goal: Interest or engagement in activities will improve Outcome: Adequate for Discharge Goal: Sleeping patterns will improve Outcome: Adequate for Discharge   Problem: Coping: Goal: Ability to verbalize frustrations and anger appropriately will improve Outcome: Adequate for Discharge Goal: Ability to demonstrate self-control will improve Outcome: Adequate for Discharge   Problem: Health Behavior/Discharge Planning: Goal: Identification of resources available to assist in meeting health care needs will improve Outcome: Adequate for Discharge Goal: Compliance with treatment plan for underlying cause of condition will improve Outcome: Adequate for Discharge   Problem: Physical Regulation: Goal: Ability to maintain clinical measurements within normal limits will improve Outcome: Adequate for Discharge   Problem: Safety: Goal: Periods of time without injury will increase Outcome: Adequate for Discharge   Problem: Education: Goal: Knowledge of disease or condition will improve Outcome: Adequate for Discharge Goal: Understanding of discharge needs will improve Outcome: Adequate for Discharge   Problem: Health Behavior/Discharge Planning: Goal: Ability to identify changes in lifestyle to reduce recurrence of condition will improve Outcome: Adequate for Discharge Goal: Identification of resources available to assist in meeting health care needs will improve Outcome: Adequate for Discharge   Problem: Physical Regulation: Goal: Complications related to the disease process, condition or treatment will be avoided or minimized Outcome: Adequate for Discharge   Problem: Safety: Goal: Ability to remain free from injury will improve Outcome: Adequate for Discharge

## 2023-08-25 NOTE — Progress Notes (Signed)
   08/25/23 0601  15 Minute Checks  Location Bedroom  Visual Appearance Calm  Behavior Composed  Sleep (Behavioral Health Patients Only)  Calculate sleep? (Click Yes once per 24 hr at 0600 safety check) Yes  Documented sleep last 24 hours 8.25

## 2023-10-23 ENCOUNTER — Encounter (HOSPITAL_COMMUNITY): Payer: Self-pay | Admitting: Registered Nurse

## 2023-10-23 ENCOUNTER — Ambulatory Visit (HOSPITAL_COMMUNITY)
Admission: EM | Admit: 2023-10-23 | Discharge: 2023-10-23 | Disposition: A | Discharge status: Home / Self Care | Payer: 59 | Attending: Registered Nurse | Admitting: Registered Nurse

## 2023-10-23 DIAGNOSIS — F332 Major depressive disorder, recurrent severe without psychotic features: Secondary | ICD-10-CM | POA: Diagnosis not present

## 2023-10-23 DIAGNOSIS — F1090 Alcohol use, unspecified, uncomplicated: Secondary | ICD-10-CM | POA: Diagnosis present

## 2023-10-23 DIAGNOSIS — F1014 Alcohol abuse with alcohol-induced mood disorder: Secondary | ICD-10-CM | POA: Diagnosis present

## 2023-10-23 LAB — COMPREHENSIVE METABOLIC PANEL
ALT: 22 U/L (ref 0–44)
AST: 39 U/L (ref 15–41)
Albumin: 4.2 g/dL (ref 3.5–5.0)
Alkaline Phosphatase: 136 U/L — ABNORMAL HIGH (ref 38–126)
Anion gap: 13 (ref 5–15)
BUN: 8 mg/dL (ref 6–20)
CO2: 26 mmol/L (ref 22–32)
Calcium: 8.8 mg/dL — ABNORMAL LOW (ref 8.9–10.3)
Chloride: 105 mmol/L (ref 98–111)
Creatinine, Ser: 0.71 mg/dL (ref 0.44–1.00)
GFR, Estimated: >60 mL/min (ref >60)
Glucose, Bld: 65 mg/dL — ABNORMAL LOW (ref 70–99)
Potassium: 4.2 mmol/L (ref 3.5–5.1)
Sodium: 144 mmol/L (ref 135–145)
Total Bilirubin: 0.5 mg/dL (ref <1.2)
Total Protein: 7.4 g/dL (ref 6.5–8.1)

## 2023-10-23 LAB — LIPID PANEL
Cholesterol: 158 mg/dL (ref 0–200)
HDL: 105 mg/dL (ref >40)
LDL Cholesterol: 10 mg/dL (ref 0–99)
Total CHOL/HDL Ratio: 1.5 {ratio}
Triglycerides: 214 mg/dL — ABNORMAL HIGH (ref <150)
VLDL: 43 mg/dL — ABNORMAL HIGH (ref 0–40)

## 2023-10-23 LAB — CBC WITH DIFFERENTIAL/PLATELET
Abs Immature Granulocytes: 0.01 10*3/uL (ref 0.00–0.07)
Basophils Absolute: 0.1 10*3/uL (ref 0.0–0.1)
Basophils Relative: 1 %
Eosinophils Absolute: 0.2 10*3/uL (ref 0.0–0.5)
Eosinophils Relative: 3 %
HCT: 46.8 % — ABNORMAL HIGH (ref 36.0–46.0)
Hemoglobin: 15.3 g/dL — ABNORMAL HIGH (ref 12.0–15.0)
Immature Granulocytes: 0 %
Lymphocytes Relative: 47 %
Lymphs Abs: 2.8 10*3/uL (ref 0.7–4.0)
MCH: 31.4 pg (ref 26.0–34.0)
MCHC: 32.7 g/dL (ref 30.0–36.0)
MCV: 96.1 fL (ref 80.0–100.0)
Monocytes Absolute: 0.4 10*3/uL (ref 0.1–1.0)
Monocytes Relative: 6 %
Neutro Abs: 2.6 10*3/uL (ref 1.7–7.7)
Neutrophils Relative %: 43 %
Platelets: 243 10*3/uL (ref 150–400)
RBC: 4.87 MIL/uL (ref 3.87–5.11)
RDW: 14.5 % (ref 11.5–15.5)
WBC: 6 10*3/uL (ref 4.0–10.5)
nRBC: 0 % (ref 0.0–0.2)

## 2023-10-23 LAB — MAGNESIUM: Magnesium: 2.1 mg/dL (ref 1.7–2.4)

## 2023-10-23 LAB — TSH: TSH: 0.306 u[IU]/mL — ABNORMAL LOW (ref 0.350–4.500)

## 2023-10-23 LAB — HEMOGLOBIN A1C
Hgb A1c MFr Bld: 5.2 % (ref 4.8–5.6)
Mean Plasma Glucose: 102.54 mg/dL

## 2023-10-23 LAB — GLUCOSE, CAPILLARY: Glucose-Capillary: 89 mg/dL (ref 70–99)

## 2023-10-23 LAB — ETHANOL: Alcohol, Ethyl (B): 255 mg/dL — ABNORMAL HIGH (ref <10)

## 2023-10-23 MED ORDER — CHLORDIAZEPOXIDE HCL 25 MG PO CAPS
25.0000 mg | ORAL_CAPSULE | Freq: Four times a day (QID) | ORAL | Status: DC
  Administered 2023-10-23 (×2): 25 mg via ORAL
  Filled 2023-10-23 (×2): qty 1

## 2023-10-23 MED ORDER — PROPRANOLOL HCL 10 MG PO TABS: 10.0000 mg | ORAL_TABLET | Freq: Three times a day (TID) | ORAL | Status: DC | PRN

## 2023-10-23 MED ORDER — CHLORDIAZEPOXIDE HCL 25 MG PO CAPS: 25.0000 mg | ORAL_CAPSULE | ORAL | Status: DC

## 2023-10-23 MED ORDER — TRAZODONE HCL 50 MG PO TABS
50.0000 mg | ORAL_TABLET | Freq: Every day | ORAL | Status: DC
  Administered 2023-10-23: 50 mg via ORAL
  Filled 2023-10-23: qty 1

## 2023-10-23 MED ORDER — ACETAMINOPHEN 325 MG PO TABS
650.0000 mg | ORAL_TABLET | Freq: Four times a day (QID) | ORAL | Status: DC | PRN
  Administered 2023-10-23: 650 mg via ORAL
  Filled 2023-10-23: qty 2

## 2023-10-23 MED ORDER — LOPERAMIDE HCL 2 MG PO CAPS
2.0000 mg | ORAL_CAPSULE | ORAL | Status: DC | PRN
  Administered 2023-10-23: 4 mg via ORAL
  Filled 2023-10-23: qty 2

## 2023-10-23 MED ORDER — ALUM & MAG HYDROXIDE-SIMETH 200-200-20 MG/5ML PO SUSP: 30.0000 mL | ORAL | Status: DC | PRN

## 2023-10-23 MED ORDER — CHLORDIAZEPOXIDE HCL 25 MG PO CAPS
25.0000 mg | ORAL_CAPSULE | Freq: Once | ORAL | Status: AC
  Administered 2023-10-23: 25 mg via ORAL
  Filled 2023-10-23: qty 1

## 2023-10-23 MED ORDER — THIAMINE HCL 100 MG/ML IJ SOLN
100.0000 mg | Freq: Once | INTRAMUSCULAR | Status: AC
  Administered 2023-10-23: 100 mg via INTRAMUSCULAR
  Filled 2023-10-23: qty 2

## 2023-10-23 MED ORDER — CHLORDIAZEPOXIDE HCL 25 MG PO CAPS: 25.0000 mg | ORAL_CAPSULE | Freq: Three times a day (TID) | ORAL | Status: DC

## 2023-10-23 MED ORDER — ONDANSETRON 4 MG PO TBDP: 4.0000 mg | ORAL_TABLET | Freq: Four times a day (QID) | ORAL | Status: DC | PRN

## 2023-10-23 MED ORDER — CHLORDIAZEPOXIDE HCL 25 MG PO CAPS: 25.0000 mg | ORAL_CAPSULE | Freq: Every day | ORAL | Status: DC

## 2023-10-23 MED ORDER — HYDROXYZINE HCL 25 MG PO TABS
25.0000 mg | ORAL_TABLET | Freq: Four times a day (QID) | ORAL | Status: DC | PRN
  Administered 2023-10-23: 25 mg via ORAL
  Filled 2023-10-23: qty 1

## 2023-10-23 MED ORDER — CHLORDIAZEPOXIDE HCL 25 MG PO CAPS: 25.0000 mg | ORAL_CAPSULE | Freq: Four times a day (QID) | ORAL | Status: DC | PRN

## 2023-10-23 MED ORDER — GABAPENTIN 300 MG PO CAPS
600.0000 mg | ORAL_CAPSULE | Freq: Two times a day (BID) | ORAL | Status: DC
  Administered 2023-10-23: 600 mg via ORAL
  Filled 2023-10-23: qty 2

## 2023-10-23 MED ORDER — ADULT MULTIVITAMIN W/MINERALS CH
1.0000 | ORAL_TABLET | Freq: Every day | ORAL | Status: DC
  Administered 2023-10-23: 1 via ORAL
  Filled 2023-10-23: qty 1

## 2023-10-23 MED ORDER — LAMOTRIGINE 100 MG PO TABS
100.0000 mg | ORAL_TABLET | Freq: Two times a day (BID) | ORAL | Status: DC
  Administered 2023-10-23: 100 mg via ORAL
  Filled 2023-10-23: qty 1

## 2023-10-23 NOTE — ED Notes (Signed)
Pt pending medical clearance, lab results and transfer to [facility].

## 2023-10-23 NOTE — ED Provider Notes (Signed)
Behavioral Health Urgent Care Medical Screening Exam  Patient Name: [name] MRN: [id number] Date of Evaluation: 10/23/23 Chief Complaint:   Diagnosis:  Final diagnoses:  Alcohol abuse with alcohol-induced mood disorder (HCC)  Major depressive disorder, recurrent severe without psychotic features (HCC)    History of Present illness: [name] is a 57 y.o. female patient presented to [facility] as a walk in accompanied by her friend August Saucer [phone]) requesting assistance with alcohol detox  [name], 57 y.o., female patient seen face to face by this provider, chart reviewed, and consulted with Dr. Nelly Rout on 10/23/23.  On evaluation [name] reports alcohol relapse 3 days ago.  She states she needs to get clean and get her life together.  Earlier during triage patient reported daily alcohol use for the last two weeks.  Binge drinking for the last 3 days.  Patient denies suicidal/self-harm/homicidal ideation, psychosis, and paranoia.  Reporting she has been off of her medications for one month.  Patient states she has outpatient services with Dr. Maggie Schwalbe but "for some reason he wouldn't refill my medicine." During evaluation [name] is seated in exam room dressed appropriate for weather.  Patient is restless and tearful.  She is alert/oriented x 4, calm, cooperative, attentive, and responses were relevant and appropriate to assessment questions.  She spoke in a clear tone at moderate volume, and normal pace, with good eye contact.   She denies suicidal/self-harm/homicidal ideation, psychosis, and paranoia.  Objectively there is no evidence of psychosis/mania or delusional thinking.  She conversed coherently, with goal directed thoughts, no distractibility, or pre-occupation.  Recommended admission to facility base crisis unit,    Flowsheet Row ED from 10/23/2023 in [facility] Admission (Discharged) from 08/21/2023 in [facility]  BEHAVIORAL MEDICINE ED from 08/20/2023 in [facility] Emergency Department at [facility]  C-SSRS RISK CATEGORY No Risk No Risk High Risk       Psychiatric Specialty Exam  Presentation  General Appearance:Appropriate for Environment  Eye Contact:Fair  Speech:Clear and Coherent; Normal Rate  Speech Volume:Normal  Handedness:Right   Mood and Affect  Mood: Anxious; Depressed  Affect: Congruent; Depressed; Tearful   Thought Process  Thought Processes: Coherent; Goal Directed  Descriptions of Associations:Intact  Orientation:Full (Time, Place and Person)  Thought Content:Logical  Diagnosis of Schizophrenia or Schizoaffective disorder in past: No   Hallucinations:None  Ideas of Reference:None  Suicidal Thoughts:No Without Intent Without Intent; Without Plan  Homicidal Thoughts:No   Sensorium  Memory: Recent Fair; Immediate Fair  Judgment: Fair  Insight: Present; Fair   Art therapist  Concentration: Fair  Attention Span: Fair  Recall: [employer] of Knowledge: Good  Language: Good   Psychomotor Activity  Psychomotor Activity: Restlessness   Assets  Assets: Communication Skills; Desire for Improvement; Housing; Social Support; Leisure Time   Sleep  Sleep: Fair  Number of hours: No data recorded  Physical Exam: Physical Exam Vitals and nursing note reviewed.  Constitutional:      General: She is not in acute distress.    Appearance: Normal appearance. She is not ill-appearing.  Cardiovascular:     Rate and Rhythm: Normal rate.  Pulmonary:     Effort: Pulmonary effort is normal. No respiratory distress.  Musculoskeletal:        General: Normal range of motion.     Cervical back: Normal range of motion.  Skin:    General: Skin is warm and dry.  Neurological:  Mental Status: She is alert and oriented to person, place, and time.  Psychiatric:        Attention and Perception: Attention and perception  normal. She does not perceive auditory or visual hallucinations.        Mood and Affect: Mood is anxious and depressed. Affect is tearful.        Speech: Speech normal.        Behavior: Behavior normal. Behavior is cooperative.        Thought Content: Thought content normal. Thought content is not paranoid or delusional. Thought content does not include homicidal or suicidal ideation.        Cognition and Memory: Cognition normal.        Judgment: Judgment normal.    Review of Systems  Constitutional:        No other complaints voied  Gastrointestinal:  Positive for nausea.  Neurological:  Negative for seizures and headaches.  Psychiatric/Behavioral:  Positive for depression and substance abuse. Negative for hallucinations and suicidal ideas. The patient is nervous/anxious.   All other systems reviewed and are negative.  Blood pressure 124/88, pulse (!) 111, temperature 98.3 F (36.8 C), temperature source Oral, SpO2 98%. There is no height or weight on file to calculate BMI.  Musculoskeletal: Strength & Muscle Tone: within normal limits Gait & Station: normal Patient leans: N/A   [facility] MSE Discharge Disposition for Follow up and Recommendations: Based on my evaluation the patient does not appear to have an emergency medical condition and can be discharged with resources and follow up care in outpatient services for Medication Management and admission to [facility]  Lab Orders         CBC with Differential/Platelet         Comprehensive metabolic panel         Hemoglobin A1c         Magnesium         Ethanol         Lipid panel         TSH         Prolactin      Meds ordered this encounter  Medications   acetaminophen (TYLENOL) tablet 650 mg   alum & mag hydroxide-simeth (MAALOX/MYLANTA) 200-200-20 MG/5ML suspension 30 mL   lamoTRIgine (LAMICTAL) tablet 100 mg   traZODone (DESYREL) tablet 50 mg   propranolol (INDERAL) tablet 10 mg   gabapentin (NEURONTIN) capsule 600 mg    thiamine (VITAMIN B1) injection 100 mg   multivitamin with minerals tablet 1 tablet   chlordiazePOXIDE (LIBRIUM) capsule 25 mg   hydrOXYzine (ATARAX) tablet 25 mg   loperamide (IMODIUM) capsule 2-4 mg   ondansetron (ZOFRAN-ODT) disintegrating tablet 4 mg   FOLLOWED BY Linked Order Group    chlordiazePOXIDE (LIBRIUM) capsule 25 mg    chlordiazePOXIDE (LIBRIUM) capsule 25 mg    chlordiazePOXIDE (LIBRIUM) capsule 25 mg    chlordiazePOXIDE (LIBRIUM) capsule 25 mg   chlordiazePOXIDE (LIBRIUM) capsule 25 mg    Viet Kemmerer, NP 10/23/2023, 3:19 PM

## 2023-10-23 NOTE — ED Notes (Signed)
Repeat vital signs and CBG obtained, NP [place] notified.  Pt pending transfer to [facility] at midnight as per NP [place].

## 2023-10-23 NOTE — ED Notes (Signed)
Pt asleep at this hour. No apparent distress. RR even and unlabored. Monitored for safety.  

## 2023-10-23 NOTE — Progress Notes (Signed)
   10/23/23 1404  [facility] Triage Screening (Walk-ins at [facility] only)  What Is the Reason for Your Visit/Call Today? [name] is a 57 year old female who presents to [facility] due to alcohol problem accompanied by her friend [phone]). Pt gives permission to speak with her friend Public house manager. Pt reports drinking alcohol today about 3 mini wine bottles.  Pt reports she is intoxicated at this moment. Pt denies any other substance use. Pt reports she started drinking in her 30's and realized it was a problem in her 63's. Pt reports she has drank alcohol daily for the past 2 weeks, she reports she has moments where she binge drinks. Pt reports being diagnosed with depression and she states she is prescribed Prozac 40mg , lamotrogine 100mg  by Dr. Maggie Schwalbe. She states she has been unable to get an appointment with her psychiatrist so she has not had a refill . She states she has been without medication for about 1 month. Pt reports nausea, sweating, lack of sleep, decreased appetite today. Pt vital signs taken by nurse. Pt reports she lives alone. Pt denies past suicide attempts. Pt reports she was in substance use treatment about 6 months ago, for alcohol. She denies hx of seizures. Pt is very tearful.Pt denies SI/HI and AVH.  How Long Has This Been Causing You Problems? > than 6 months  Have You Recently Had Any Thoughts About Hurting Yourself? No  Are You Planning to Commit Suicide/Harm Yourself At This time? No  Have you Recently Had Thoughts About Hurting Someone Karolee Ohs? No  Are You Planning To Harm Someone At This Time? No  Are you currently experiencing any auditory, visual or other hallucinations? No  Have You Used Any Alcohol or Drugs in the Past 24 Hours? Yes  How long ago did you use Drugs or Alcohol? today  What Did You Use and How Much? alcohol today about 2 mini wine bottles  Do you have any current medical co-morbidities that require immediate attention? No  Clinician description of patient physical  appearance/behavior: reports she is intoxicated, tearful, casually dressed  What Do You Feel Would Help You the Most Today? Alcohol or Drug Use Treatment  If access to [facility] was not available, would you have sought care in the Emergency Department? No  Determination of Need Urgent (48 hours)  Options For Referral Facility-Based Crisis;BH Urgent Care

## 2023-10-23 NOTE — Discharge Instructions (Addendum)
Transfer to [facility] once lab results are in

## 2023-10-23 NOTE — BH Assessment (Addendum)
Comprehensive Clinical Assessment (CCA) Note  10/23/2023 [name] [id number]  Disposition: Per Assunta Found, NP admission to [facility] is recommended.    The patient demonstrates the following risk factors for suicide: Chronic risk factors for suicide include: psychiatric disorder of Depressive Disorder Unspecified and substance use disorder. Acute risk factors for suicide include: social withdrawal/isolation and loss (financial, interpersonal, professional). Protective factors for this patient include: positive social support, positive therapeutic relationship, responsibility to others (children, family), and hope for the future. Considering these factors, the overall suicide risk at this point appears to be low. Patient is appropriate for outpatient follow up.  Patient is a 57 year old female with a history of Alcohol Use Disorder, severe and Depressive Disorder Unspecified who presents voluntarily to [facility] for assessment.  Patient presents due to alcohol problem accompanied by her friend Public house manager. She preferred that [facility] stay for triage.  Patient reports drinking alcohol today, stating she drank about 3 mini wine bottles. She reports she is intoxicated currently.  Patient reports she started drinking in her 30's and realized "it was a problem" in her 58's. Patient reports she has been drinking daily for the past 2 weeks. There have been days during which she would binge drink during this 2 week period. Patient reports she had been engaged in outpatient treatment, up until 1 month ago when she ran out of medications.  She is Rx Prozac 40mg , lamotrogine 100mg  by Dr. Maggie Schwalbe.  Patient states she tried, but was unable to get an appointment with Dr. Maggie Schwalbe.  Patient reports nausea, sweating, lack of sleep, decreased appetite today. Patient denies past suicide attempts. Patient reports she completed detox treatment last about 6 months ago.  She denies hx of seizures. Patient is very tearful.   She denies SI/HI and AVH.  Chief Complaint:  Chief Complaint  Patient presents with   Alcohol Intoxication   Alcohol Problem   Visit Diagnosis: Alcohol Use Disorder, severe                             Depressive Disorder Unspecified    CCA Screening, Triage and Referral (STR)  Patient Reported Information How did you hear about [place]? Self  What Is the Reason for Your Visit/Call Today? [name] is a 57 year old female who presents to [facility] due to alcohol problem accompanied by her friend [phone]). Pt gives permission to speak with her friend Public house manager. Pt reports drinking alcohol today about 3 mini wine bottles.  Pt reports she is intoxicated at this moment. Pt denies any other substance use. Pt reports she started drinking in her 30's and realized it was a problem in her 13's. Pt reports she has drank alcohol daily for the past 2 weeks, she reports she has moments where she binge drinks. Pt reports being diagnosed with depression and she states she is prescribed Prozac 40mg , lamotrogine 100mg  by Dr. Maggie Schwalbe. She states she has been unable to get an appointment with her psychiatrist so she has not had a refill . She states she has been without medication for about 1 month. Pt reports nausea, sweating, lack of sleep, decreased appetite today. Pt vital signs taken by nurse. Pt reports she lives alone. Pt denies past suicide attempts. Pt reports she was in substance use treatment about 6 months ago, for alcohol. She denies hx of seizures. Pt is very tearful.Pt denies SI/HI and AVH.  How Long Has This Been Causing  You Problems? > than 6 months  What Do You Feel Would Help You the Most Today? Alcohol or Drug Use Treatment   Have You Recently Had Any Thoughts About Hurting Yourself? No  Are You Planning to Commit Suicide/Harm Yourself At This time? No   Flowsheet Row ED from 10/23/2023 in [facility] Admission (Discharged) from [facility]/21/2023 in [facility]  BEHAVIORAL MEDICINE ED from 08/20/2023 in [facility] Emergency Department at [facility]  C-SSRS RISK CATEGORY No Risk No Risk High Risk       Have you Recently Had Thoughts About Hurting Someone Karolee Ohs? No  Are You Planning to Harm Someone at This Time? No  Explanation: N/A   Have You Used Any Alcohol or Drugs in the Past 24 Hours? Yes  What Did You Use and How Much? alcohol today about 2 mini wine bottles   Do You Currently Have a Therapist/Psychiatrist? Yes  Name of Therapist/Psychiatrist: Name of Therapist/Psychiatrist: Dr. Maggie Schwalbe for med management   Have You Been Recently Discharged From Any Office Practice or Programs? No  Explanation of Discharge From Practice/Program: N/A     CCA Screening Triage Referral Assessment Type of Contact: Face-to-Face  Telemedicine Service Delivery:   Is this Initial or Reassessment?   Date Telepsych consult ordered in [facility]:    Time Telepsych consult ordered in [facility]:    Location of Assessment: [facility] Assessment Services  Provider Location: [facility] Assessment Services   Collateral Involvement: None   Does Patient Have a Automotive engineer Guardian? No  Legal Guardian Contact Information: N/A  Copy of Legal Guardianship Form: -- (N/A)  Legal Guardian Notified of Arrival: -- (N/A)  Legal Guardian Notified of Pending Discharge: -- (N/A)  If Minor and Not Living with Parent(s), Who has Custody? N/A  Is CPS involved or ever been involved? Never  Is APS involved or ever been involved? Never   Patient Determined To Be At Risk for Harm To Self or Others Based on Review of Patient Reported Information or Presenting Complaint? No  Method: -- (N/A, no HI)  Availability of Means: -- (N/A, no HI)  Intent: -- (N/A, no HI)  Notification Required: -- (N/A, no HI)  Additional Information for Danger to Others Potential: -- (N/A, no HI)  Additional Comments for Danger to Others Potential: N/A, no HI  Are There Guns or  Other Weapons in Your Home? No  Types of Guns/Weapons: N/A  Are These Weapons Safely Secured?                            -- (N/A)  Who Could Verify You Are Able To Have These Secured: N/A  Do You Have any Outstanding Charges, Pending Court Dates, Parole/Probation? None  Contacted To Inform of Risk of Harm To Self or Others: -- (N/A, no HI)    Does Patient Present under Involuntary Commitment? No    [place] of Residence: [place]   Patient Currently Receiving the Following Services: Medication Management   Determination of Need: Urgent (48 hours)   Options For Referral: Facility-Based Crisis; BH Urgent Care     CCA Biopsychosocial Patient Reported Schizophrenia/Schizoaffective Diagnosis in Past: No   Strengths: Patient is seeking treatment, she has supportive friends   Mental Health Symptoms Depression:   Hopelessness; Worthlessness; Sleep (too much or little); Increase/decrease in appetite   Duration of Depressive symptoms:  Duration of Depressive Symptoms: Greater than two weeks   Mania:  None   Anxiety:    Worrying   Psychosis:   None   Duration of Psychotic symptoms:    Trauma:   None   Obsessions:   None   Compulsions:   None   Inattention:   N/A   Hyperactivity/Impulsivity:   N/A   Oppositional/Defiant Behaviors:   N/A   Emotional Irregularity:   Chronic feelings of emptiness   Other Mood/Personality Symptoms:   N/A    Mental Status Exam Appearance and self-care  Stature:   Average   Weight:   Average weight   Clothing:   Neat/clean   Grooming:   Normal   Cosmetic use:   None   Posture/gait:   Normal   Motor activity:   Not Remarkable   Sensorium  Attention:   Normal   Concentration:   Normal   Orientation:   X5   Recall/memory:   Normal   Affect and Mood  Affect:   Depressed   Mood:   Depressed   Relating  Eye contact:   Fleeting   Facial expression:   Constricted   Attitude toward  examiner:   Passive (due to feeling ill - current intoxication)   Thought and Language  Speech flow:  Clear and Coherent   Thought content:   Appropriate to Mood and Circumstances   Preoccupation:   None   Hallucinations:   None   Organization:   Goal-directed   Company secretary of Knowledge:   Average   Intelligence:   Average   Abstraction:   Normal   Judgement:   Impaired   Reality Testing:   Adequate   Insight:   Gaps   Decision Making:   Normal; Vacilates   Social Functioning  Social Maturity:   Responsible   Social Judgement:   Normal   Stress  Stressors:   Illness (SA issues)   Coping Ability:   Exhausted   Skill Deficits:   Self-control   Supports:   Friends/Service system     Religion: Religion/Spirituality Are You A Religious Person?: No How Might This Affect Treatment?: NA  Leisure/Recreation: Leisure / Recreation Do You Have Hobbies?: Yes Leisure and Hobbies: Per chart review, pt reports she enjoys coloring, meditations, paint, abstract painting, walking, going to mall  Exercise/Diet: Exercise/Diet Do You Exercise?: No Have You Gained or Lost A Significant Amount of Weight in the Past Six Months?: No Do You Follow a Special Diet?: No Do You Have Any Trouble Sleeping?: No   CCA Employment/Education Employment/Work Situation: Employment / Work Situation Employment Situation: Unemployed Patient's Job has Been Impacted by Current Illness: No Has Patient ever Been in Equities trader?: No  Education: Education Is Patient Currently Attending School?: No Last Grade Completed: 14 Did You Product manager?: Yes What Type of College Degree Do you Have?: 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  Childhood History:  Childhood History By whom was/is the patient raised?: Adoptive parents Did patient suffer  any verbal/emotional/physical/sexual abuse as a child?: No Did patient suffer from severe childhood neglect?: No Has patient ever been sexually abused/assaulted/raped as an adolescent or adult?: No Was the patient ever a victim of a crime or a disaster?: No Witnessed domestic violence?: No Has patient been affected by domestic violence as an adult?: No       CCA Substance Use Alcohol/Drug Use: Alcohol / Drug Use Pain Medications: SEE MAR Prescriptions: SEE MAR Over the Counter: SEE MAR History of alcohol / drug use?: Yes Longest period of sobriety (when/how long): 6 months Negative Consequences of Use: Financial, Legal, Personal relationships, Work / School Withdrawal Symptoms: Tremors, Irritability, Nausea / Vomiting, Fever / Chills Substance #1 Name of Substance 1: ETOH 1 - Age of First Use: 30s 1 - Amount (size/oz): varies 1 - Frequency: daily 1 - Duration: past two weeks 1 - Last Use / Amount: today - 2 mini wine bottles 1 - Method of Aquiring: NA 1- Route of Use: drinks                       ASAM's:  Six Dimensions of Multidimensional Assessment  Dimension 1:  Acute Intoxication and/or Withdrawal Potential:   Dimension 1:  Description of individual's past and current experiences of substance use and withdrawal: appears intoxicated  Dimension 2:  Biomedical Conditions and Complications:   Dimension 2:  Description of patient's biomedical conditions and  complications:  adequate ability to cope with physical discomfort  Dimension 3:  Emotional, Behavioral, or Cognitive Conditions and Complications:  Dimension 3:  Description of emotional, behavioral, or cognitive conditions and complications: underlying depression, untreated currently as she has been off meds for past month  Dimension 4:  Readiness to Change:  Dimension 4:  Description of Readiness to Change criteria: Limited awareness of MI and SA related issues  Dimension 5:  Relapse, Continued use, or Continued Problem Potential:  Dimension 5:  Relapse, continued use, or continued problem potential critiera description: Hx of multiple relapses  Dimension 6:  Recovery/Living Environment:  Dimension 6:  Recovery/Iiving environment criteria description: Mother and kids are supportive. Friend is supportive  ASAM Severity Score: ASAM's Severity Rating Score: 9  ASAM Recommended Level of Treatment: ASAM Recommended Level of Treatment: Level III Residential Treatment   Substance use Disorder (SUD) Substance Use Disorder (SUD)  Checklist Symptoms of Substance Use: Continued use despite having a persistent/recurrent physical/psychological problem caused/exacerbated by use, Evidence of tolerance, Continued use despite persistent or recurrent social, interpersonal problems, caused or exacerbated by use, Persistent desire or unsuccessful efforts to cut down or control use, Social, occupational, recreational activities given up or reduced due to use, Substance(s) often taken in larger amounts or over longer times than was intended  Recommendations for Services/Supports/Treatments: Recommendations for Services/Supports/Treatments Recommendations For Services/Supports/Treatments: Individual Therapy, Facility Based Crisis, Detox  Discharge Disposition:    DSM5 Diagnoses: Patient Active Problem List   Diagnosis Date Noted   Major depressive disorder, 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/22/2023   Alcohol  withdrawal (HCC) 04/10/2023   Asthma exacerbation 04/09/2023   Alcohol use disorder 12/26/2022   Asthma 12/26/2022   MDD (major depressive disorder), recurrent episode, severe (HCC) 12/25/2022   Depression, unspecified 01/06/2021   Alcohol use 01/06/2021   Insomnia 06/18/2020   SUI (stress urinary incontinence, female)    Hypertension    History of anemia    GERD (gastroesophageal reflux disease)    History of alcoholism (HCC) 02/25/2020   Intractable migraine with  aura without status migrainosus 02/25/2020   Attention deficit hyperactivity disorder (ADHD), predominantly inattentive type 01/07/2020   GAD (generalized anxiety disorder) 01/07/2020   MDD (major depressive disorder), recurrent severe, without psychosis (HCC) 01/07/2020   Diverticulitis with abscess/fistula s/p robotic left colectomy 06/16/2020 09/06/2019   MDD (major depressive disorder) 10/07/2015   Alcohol abuse with alcohol-induced mood disorder (HCC) 10/06/2015     Referrals to Alternative Service(s): Referred to Alternative Service(s):   Place:   Date:   Time:    Referred to Alternative Service(s):   Place:   Date:   Time:    Referred to Alternative Service(s):   Place:   Date:   Time:    Referred to Alternative Service(s):   Place:   Date:   Time:     Yetta Glassman, [facility]

## 2023-10-23 NOTE — Progress Notes (Signed)
Pt is admitted to [facility] due to ETOH abuse. Pt will transfer to [facility] once labs have resulted.  Pt is exhibiting mild withdrawal symptoms and was medicated per order. Pt's CIWA was 8. Staff will monitor for pt's safety.

## 2023-10-24 ENCOUNTER — Other Ambulatory Visit (HOSPITAL_COMMUNITY)
Admission: EM | Admit: 2023-10-24 | Discharge: 2023-10-26 | Disposition: A | Discharge status: Home / Self Care | Payer: 59 | Attending: Addiction Medicine | Admitting: Addiction Medicine

## 2023-10-24 DIAGNOSIS — F411 Generalized anxiety disorder: Secondary | ICD-10-CM | POA: Insufficient documentation

## 2023-10-24 DIAGNOSIS — F1090 Alcohol use, unspecified, uncomplicated: Secondary | ICD-10-CM

## 2023-10-24 DIAGNOSIS — F17200 Nicotine dependence, unspecified, uncomplicated: Secondary | ICD-10-CM | POA: Insufficient documentation

## 2023-10-24 DIAGNOSIS — Z91148 Patient's other noncompliance with medication regimen for other reason: Secondary | ICD-10-CM | POA: Insufficient documentation

## 2023-10-24 DIAGNOSIS — F1994 Other psychoactive substance use, unspecified with psychoactive substance-induced mood disorder: Secondary | ICD-10-CM

## 2023-10-24 DIAGNOSIS — F329 Major depressive disorder, single episode, unspecified: Secondary | ICD-10-CM | POA: Insufficient documentation

## 2023-10-24 DIAGNOSIS — F1014 Alcohol abuse with alcohol-induced mood disorder: Secondary | ICD-10-CM | POA: Diagnosis present

## 2023-10-24 MED ORDER — NICOTINE 14 MG/24HR TD PT24
14.0000 mg | MEDICATED_PATCH | Freq: Every day | TRANSDERMAL | Status: DC
  Administered 2023-10-24 – 2023-10-26 (×3): 14 mg via TRANSDERMAL
  Filled 2023-10-24 (×3): qty 1

## 2023-10-24 MED ORDER — CHLORDIAZEPOXIDE HCL 25 MG PO CAPS
25.0000 mg | ORAL_CAPSULE | Freq: Every day | ORAL | Status: AC
  Administered 2023-10-26: 25 mg via ORAL
  Filled 2023-10-24: qty 1

## 2023-10-24 MED ORDER — GABAPENTIN 300 MG PO CAPS: 600.0000 mg | ORAL_CAPSULE | Freq: Two times a day (BID) | ORAL | Status: DC

## 2023-10-24 MED ORDER — ALBUTEROL SULFATE (2.5 MG/3ML) 0.083% IN NEBU
2.5000 mg | INHALATION_SOLUTION | Freq: Three times a day (TID) | RESPIRATORY_TRACT | Status: DC | PRN
  Filled 2023-10-24: qty 3

## 2023-10-24 MED ORDER — TRAZODONE HCL 50 MG PO TABS
50.0000 mg | ORAL_TABLET | Freq: Every day | ORAL | Status: DC
  Administered 2023-10-24: 50 mg via ORAL
  Filled 2023-10-24: qty 1

## 2023-10-24 MED ORDER — CHLORDIAZEPOXIDE HCL 25 MG PO CAPS
25.0000 mg | ORAL_CAPSULE | Freq: Two times a day (BID) | ORAL | Status: AC
  Administered 2023-10-25 (×2): 25 mg via ORAL
  Filled 2023-10-24 (×2): qty 1

## 2023-10-24 MED ORDER — ALBUTEROL SULFATE 1.25 MG/3ML IN NEBU: 1.0000 | INHALATION_SOLUTION | Freq: Three times a day (TID) | RESPIRATORY_TRACT | Status: DC | PRN

## 2023-10-24 MED ORDER — LOPERAMIDE HCL 2 MG PO CAPS
2.0000 mg | ORAL_CAPSULE | ORAL | Status: DC | PRN
  Administered 2023-10-25: 2 mg via ORAL
  Filled 2023-10-24: qty 1

## 2023-10-24 MED ORDER — CHLORDIAZEPOXIDE HCL 25 MG PO CAPS: 25.0000 mg | ORAL_CAPSULE | Freq: Four times a day (QID) | ORAL | Status: DC | PRN

## 2023-10-24 MED ORDER — GABAPENTIN 100 MG PO CAPS
100.0000 mg | ORAL_CAPSULE | Freq: Three times a day (TID) | ORAL | Status: DC
  Administered 2023-10-24 – 2023-10-26 (×7): 100 mg via ORAL
  Filled 2023-10-24 (×7): qty 1

## 2023-10-24 MED ORDER — ONDANSETRON 4 MG PO TBDP: 4.0000 mg | ORAL_TABLET | Freq: Four times a day (QID) | ORAL | Status: DC | PRN

## 2023-10-24 MED ORDER — ACAMPROSATE CALCIUM 333 MG PO TBEC
666.0000 mg | DELAYED_RELEASE_TABLET | Freq: Three times a day (TID) | ORAL | Status: DC
  Administered 2023-10-24 – 2023-10-26 (×7): 666 mg via ORAL
  Filled 2023-10-24 (×7): qty 2

## 2023-10-24 MED ORDER — CHLORDIAZEPOXIDE HCL 25 MG PO CAPS
25.0000 mg | ORAL_CAPSULE | Freq: Three times a day (TID) | ORAL | Status: AC
  Administered 2023-10-24 (×3): 25 mg via ORAL
  Filled 2023-10-24 (×2): qty 1

## 2023-10-24 MED ORDER — CHLORDIAZEPOXIDE HCL 25 MG PO CAPS: 25.0000 mg | ORAL_CAPSULE | Freq: Three times a day (TID) | ORAL | Status: DC

## 2023-10-24 MED ORDER — HYDROXYZINE HCL 25 MG PO TABS: 25.0000 mg | ORAL_TABLET | Freq: Four times a day (QID) | ORAL | Status: DC | PRN

## 2023-10-24 MED ORDER — CHLORDIAZEPOXIDE HCL 25 MG PO CAPS: 25.0000 mg | ORAL_CAPSULE | Freq: Every day | ORAL | Status: DC

## 2023-10-24 MED ORDER — CHLORDIAZEPOXIDE HCL 25 MG PO CAPS: 25.0000 mg | ORAL_CAPSULE | ORAL | Status: DC

## 2023-10-24 MED ORDER — ACETAMINOPHEN 325 MG PO TABS
650.0000 mg | ORAL_TABLET | Freq: Four times a day (QID) | ORAL | Status: DC | PRN
  Administered 2023-10-24 – 2023-10-26 (×6): 650 mg via ORAL
  Filled 2023-10-24 (×6): qty 2

## 2023-10-24 MED ORDER — ADULT MULTIVITAMIN W/MINERALS CH
1.0000 | ORAL_TABLET | Freq: Every day | ORAL | Status: DC
  Administered 2023-10-24 – 2023-10-26 (×3): 1 via ORAL
  Filled 2023-10-24 (×3): qty 1

## 2023-10-24 MED ORDER — PROPRANOLOL HCL 10 MG PO TABS: 10.0000 mg | ORAL_TABLET | Freq: Three times a day (TID) | ORAL | Status: DC | PRN

## 2023-10-24 MED ORDER — LAMOTRIGINE 100 MG PO TABS: 100.0000 mg | ORAL_TABLET | Freq: Two times a day (BID) | ORAL | Status: DC

## 2023-10-24 MED ORDER — UMECLIDINIUM-VILANTEROL 62.5-25 MCG/ACT IN AEPB
1.0000 | INHALATION_SPRAY | Freq: Every day | RESPIRATORY_TRACT | Status: DC
  Administered 2023-10-24 – 2023-10-25 (×2): 1 via RESPIRATORY_TRACT
  Filled 2023-10-24: qty 14

## 2023-10-24 MED ORDER — ALUM & MAG HYDROXIDE-SIMETH 200-200-20 MG/5ML PO SUSP: 30.0000 mL | ORAL | Status: DC | PRN

## 2023-10-24 MED ORDER — CHLORDIAZEPOXIDE HCL 25 MG PO CAPS
25.0000 mg | ORAL_CAPSULE | Freq: Four times a day (QID) | ORAL | Status: DC
  Filled 2023-10-24: qty 1

## 2023-10-24 NOTE — ED Notes (Signed)
Patient is sleeping. Respirations equal and unlabored, skin warm and dry. No change in assessment or acuity. Routine safety checks conducted according to facility protocol. Will continue to monitor for safety.   

## 2023-10-24 NOTE — Group Note (Signed)
Group Topic: Recovery Basics  Group Date: 10/24/2023 Start Time: 1530 End Time: 1600 Facilitators: Jenean Lindau, RN  Department: [facility]  Number of Participants: 5  Group Focus: chemical dependency education and chemical dependency issues Treatment Modality:  Behavior Modification Therapy Interventions utilized were patient education, problem solving, and reality testing Purpose: enhance coping skills, explore maladaptive thinking, express feelings, express irrational fears, improve communication skills, increase insight, regain self-worth, and reinforce self-care  Name: [name] Date of Birth: 07-Jun-1966  MR: [id number]    Level of Participation: active Quality of Participation: attentive and cooperative Interactions with others: gave feedback Mood/Affect: appropriate Triggers (if applicable):   Cognition: coherent/clear Progress: Gaining insight Response:   Plan: follow-up needed  Patients Problems:  Patient Active Problem List   Diagnosis Date Noted   Major depressive disorder, 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/22/2023   Alcohol withdrawal (HCC) 04/10/2023   Asthma exacerbation 04/09/2023   Alcohol use disorder 12/26/2022   Asthma 12/26/2022   MDD (major depressive disorder), recurrent episode, severe (HCC) 12/25/2022   Depression, unspecified 01/06/2021   Alcohol use 01/06/2021   Insomnia 06/18/2020   SUI (stress urinary incontinence, female)    Hypertension    History of anemia    GERD (gastroesophageal reflux disease)    History of alcoholism (HCC) 02/25/2020   Intractable migraine with aura without status migrainosus 02/25/2020   Attention deficit hyperactivity disorder (ADHD), predominantly inattentive type 01/07/2020   GAD (generalized anxiety disorder) 01/07/2020   MDD (major depressive disorder), recurrent severe, without psychosis (HCC) 01/07/2020   Diverticulitis with abscess/fistula s/p  robotic left colectomy 06/16/2020 09/06/2019   MDD (major depressive disorder) 10/07/2015   Alcohol abuse with alcohol-induced mood disorder (HCC) 10/06/2015

## 2023-10-24 NOTE — ED Notes (Signed)
Patient attended group and is eating lunch.  She is calm and pleasant.  No complaints offered.  No evidence of etoh withdrawal.  Will monitor.

## 2023-10-24 NOTE — Care Management (Addendum)
[facility] Care Management   Writer referred patient to the CD-IOP program.    4pm  Patient has a follow up CD-IOP appointment on Monday at 1PM at  [facility] (2nd Floor) [address]  [place] [address] [phone]

## 2023-10-24 NOTE — ED Notes (Signed)
MHT went to break at 0500am - 0530am RN did not do rounds.

## 2023-10-24 NOTE — Discharge Instructions (Addendum)
Based on the information that you have provided and the presenting issues outpatient services and resources for have been recommended.  It is imperative that you follow through with treatment recommendations within 5-7 days from the of discharge to mitigate further risk to your safety and mental well-being. A list of referrals has been provided below to get you started.  You are not limited to the list provided.  In case of an urgent crisis, you may contact the Mobile Crisis Unit with [facility] at [phone].               CD_IOP Program in [facility]   Patient has a follow up CD-IOP appointment on Monday at 1PM at  [facility] (2nd Floor) [address]  [place] [address] [phone]    Medication Management Appointment  Writer coordinated an virtual appointment for the patient on 11/07/2023 at 9 am. With Dr. Lolly Mustache.   [facility] Outpatient clinic  Address: [address], [place], [place] [address]  Phone: [phone]

## 2023-10-24 NOTE — ED Notes (Addendum)
Pt transferred from [facility]. Pt is A & O x 4. Pt is oriented to the unit and provided with snack.Pt is asthmatic and uses inhalers ( albuterol and anoro ellipta) everyday. she says she hasn't use them since she got here and requesting for them.she said she has them in her locker, and that is confirmed . Provider notified and order is put in for pt. Pt denies SI/HI/AVH. Will continue to monitor for safety and provide support.

## 2023-10-24 NOTE — Group Note (Signed)
Group Topic: Overcoming Obstacles  Group Date: 10/24/2023 Start Time: 1000 End Time: 1104 Facilitators: Vonzell Schlatter B  Department: [facility]  Number of Participants: 5  Group Focus: activities of daily living skills and affirmation Treatment Modality:  Psychoeducation Interventions utilized were problem solving and support Purpose: increase insight and reinforce self-care  Name: [name] Date of Birth: [birth year]  MR: [id number]    Level of Participation: active Quality of Participation: attentive and cooperative Interactions with others: gave feedback Mood/Affect: positive Triggers (if applicable): n/a Cognition: coherent/clear Progress: Moderate Response: pt has lost a lot in the past and she is wanting to get help and stay clean Plan: follow-up needed  Patients Problems:  Patient Active Problem List   Diagnosis Date Noted   Major depressive disorder, 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/22/2023   Alcohol withdrawal (HCC) 04/10/2023   Asthma exacerbation 04/09/2023   Alcohol use disorder 12/26/2022   Asthma 12/26/2022   MDD (major depressive disorder), recurrent episode, severe (HCC) 12/25/2022   Depression, unspecified 01/06/2021   Alcohol use 01/06/2021   Insomnia 06/18/2020   SUI (stress urinary incontinence, female)    Hypertension    History of anemia    GERD (gastroesophageal reflux disease)    History of alcoholism (HCC) 02/25/2020   Intractable migraine with aura without status migrainosus 02/25/2020   Attention deficit hyperactivity disorder (ADHD), predominantly inattentive type 01/07/2020   GAD (generalized anxiety disorder) 01/07/2020   MDD (major depressive disorder), recurrent severe, without psychosis (HCC) 01/07/2020   Diverticulitis with abscess/fistula s/p robotic left colectomy 06/16/2020 09/06/2019   MDD (major depressive disorder) 10/07/2015   Alcohol abuse with alcohol-induced mood disorder  (HCC) 10/06/2015

## 2023-10-24 NOTE — ED Notes (Signed)
MHT went and got her inhaler out of her locker and put in in the nursing station med room in her box.

## 2023-10-24 NOTE — ED Notes (Signed)
Patient remains asleep in bed without issue or complaint.  No distress or signs of etoh withdrawal at this time.  Will monitor.

## 2023-10-24 NOTE — ED Provider Notes (Signed)
Facility Based Crisis Admission H&P  Date: 10/24/23 Patient Name: [name] MRN: [id number] Chief Complaint: I was trying to detox myself but it was not working  Diagnoses:  Final diagnoses:  Alcohol use disorder  Substance induced mood disorder (HCC)  Tobacco use disorder   HPI:  [name] is a 57 yo female with a past psychiatric history of alcohol use disorder, MDD, tobacco use disorder, multiple inpatient psychiatric hospitalizations (most recent [facility] 08/2023), multiple rehab stays (most recent [place] rehab last July-September) who presents to the [facility] requesting detox from alcohol.   Patient reports sobriety for 3 months prior to relapsing last week on 6 bootleggers a day and mini glasses of wine. She reports she tried to detox on her own but was shaking, nauseous so she would drink again in an attempt to mitigate the withdrawal symptoms. She reports longest period of sobriety was 5 years from 2014-2020 and she reports that going to rehab and meetings helped with maintaining sobriety. She reports she was last in rehab in [place] from July-September and then also went to IOP at [name] in September. She also reports previously going to rehab at [facility] in 2014. 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 She also reports smoking and vaping. She denies other substance use. She reports current withdrawal symptoms to include anxiety.  She reports past psychiatric history of MDD and GAD. She reports she was on prozac, lamictal, gabapentin and has also been on acamprosate in the past. She reports she was unable to get appointment with outpatient psychiatrist (Dr. Maggie Schwalbe) for the past month to refill medications. She reports she was on prozac 80, gabapentin 600 BID, lamictal 100 BID. Not on propranolol. She reports increased depressive symptoms leading up to relapse including  depressed mood, changes in sleep, concentration, energy. She denies suicidal thoughts. PHQ-9 11 on admission. She denies any history of manic episodes. She reports generalized anxiety. She denies history of trauma. Per chart review, patient has had at least 5 psychiatric hospitalizations (10/2015, 12/2015, 01/2021, 12/2022, 08/2023). Most recent at [facility] 08/2023 for suicidal ideations. She has had prior medication trials including prozac, buspar, gabapentin, proprnaolol, lexapro, viibyrd, zoloft, lamictal, campral, naltrexone (makes her nauseous), ambien, amphetamine.   She reports past medical history of asthma. She denies allergies to medications.   She reports that she lives by herself and is a caregiver for work. She reports her support system is her daughter (lives in [place]), mom (lives across the street), and her friend. She is divorced.   PHQ 2-9:   Flowsheet Row ED from 10/24/2023 in [facility] ED from 10/23/2023 in [facility] Admission (Discharged) from 08/21/2023 in [facility] BEHAVIORAL MEDICINE  C-SSRS RISK CATEGORY No Risk No Risk No Risk       Screenings    Flowsheet Row Most Recent Value  CIWA-Ar Total 1       Musculoskeletal  Strength & Muscle Tone: within normal limits Gait & Station: normal Patient leans: N/A  Psychiatric Specialty Exam  Presentation General Appearance:  Appropriate for Environment  Eye Contact: Fair  Speech: Clear and Coherent; Normal Rate  Speech Volume: Normal  Handedness: Right   Mood and Affect  Mood: Anxious  Affect: Congruent   Thought Process  Thought Processes: Coherent; Goal Directed  Descriptions of Associations:Intact  Orientation:Full (Time, Place and Person)  Thought Content:Logical  Diagnosis of Schizophrenia or Schizoaffective disorder in past: No   Hallucinations:Hallucinations: None  Ideas of Reference:None  Suicidal Thoughts:Suicidal  Thoughts: No  Homicidal Thoughts:Homicidal Thoughts: No   Sensorium  Memory: Recent Fair; Immediate Fair  Judgment: Fair  Insight: Present; Fair   Art therapist  Concentration: Fair  Attention Span: Fair  Recall: Jennelle Human of Knowledge: Good  Language: Good   Psychomotor Activity  Psychomotor Activity: Psychomotor Activity: Restlessness   Assets  Assets: Communication Skills; Desire for Improvement; Housing; Social Support; Leisure Time   Sleep  Sleep: Sleep: Fair   Nutritional Assessment (For OBS and [facility] admissions only) Has the patient had a weight loss or gain of 10 pounds or more in the last 3 months?: No Has the patient had a decrease in food intake/or appetite?: No Does the patient have dental problems?: No Does the patient have eating habits or behaviors that may be indicators of an eating disorder including binging or inducing vomiting?: No Has the patient recently lost weight without trying?: 0 Has the patient been eating poorly because of a decreased appetite?: 0 Malnutrition Screening Tool Score: 0   Physical Exam Constitutional:      Appearance: the patient is not toxic-appearing.  Pulmonary:     Effort: Pulmonary effort is normal.  Neurological:     General: No focal deficit present.     Mental Status: the patient is alert and oriented to person, place, and time.   Review of Systems  Respiratory:  Negative for shortness of breath.   Cardiovascular:  Negative for chest pain.  Gastrointestinal:  Negative for abdominal pain, constipation, diarrhea, nausea and vomiting.  Neurological:  Negative for headaches.   Blood pressure 139/79, pulse 90, temperature 98.1 F (36.7 C), temperature source Oral, resp. rate 18, SpO2 97%. There is no height or weight on file to calculate BMI.  Past Psychiatric History: Past psychiatric history of MDD and GAD. She reports she was on prozac, lamictal, gabapentin and has also been on acamprosate  in the past. She reports she was unable to get appointment with outpatient psychiatrist (Dr. Maggie Schwalbe) for the past month to refill medications. She reports she was on prozac 80, gabapentin 600 BID, lamictal 100 BID. Not on propranolol. Per chart review, patient has had at least 5 psychiatric hospitalizations (10/2015, 12/2015, 01/2021, 12/2022, 08/2023). Most recent at [facility] 08/2023 for suicidal ideations. She has had prior medication trials including prozac, buspar, gabapentin, proprnaolol, lexapro, viibyrd, zoloft, lamictal, campral, naltrexone (makes her nauseous), ambien, amphetamine. She has been non-compliant with medications.  Is the patient at risk to self? No  Has the patient been a risk to self in the past 6 months? Yes .    Has the patient been a risk to self within the distant past? Yes   Is the patient a risk to others? No   Has the patient been a risk to others in the past 6 months? No   Has the patient been a risk to others within the distant past? 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 She reports her support system is her daughter (lives in [place]), mom (lives across the street), and her friend. She is divorced.   Last Labs:  Admission on 10/23/2023, Discharged on 10/23/2023  Component Date Value Ref Range Status   WBC 10/23/2023 6.0  4.0 - 10.5 K/uL Final   RBC  10/23/2023 4.87  3.87 - 5.11 MIL/uL Final   Hemoglobin 10/23/2023 15.3 (H)  12.0 - 15.0 g/dL Final   HCT 78/29/5621 46.8 (H)  36.0 - 46.0 % Final   MCV 10/23/2023 96.1  80.0 - 100.0 fL Final   MCH 10/23/2023 31.4  26.0 - 34.0 pg Final   MCHC 10/23/2023 32.7  30.0 - 36.0 g/dL Final   RDW 30/86/5784 14.5  11.5 - 15.5 % Final   Platelets 10/23/2023 243  150 - 400 K/uL Final   nRBC 10/23/2023 0.0  0.0 - 0.2 % Final   Neutrophils Relative % 10/23/2023 43  % Final   Neutro Abs 10/23/2023 2.6  1.7 - 7.7 K/uL Final    Lymphocytes Relative 10/23/2023 47  % Final   Lymphs Abs 10/23/2023 2.8  0.7 - 4.0 K/uL Final   Monocytes Relative 10/23/2023 6  % Final   Monocytes Absolute 10/23/2023 0.4  0.1 - 1.0 K/uL Final   Eosinophils Relative 10/23/2023 3  % Final   Eosinophils Absolute 10/23/2023 0.2  0.0 - 0.5 K/uL Final   Basophils Relative 10/23/2023 1  % Final   Basophils Absolute 10/23/2023 0.1  0.0 - 0.1 K/uL Final   Immature Granulocytes 10/23/2023 0  % Final   Abs Immature Granulocytes 10/23/2023 0.01  0.00 - 0.07 K/uL Final   Performed at [facility], [address] [place], [place] [address]   Sodium 10/23/2023 144  135 - 145 mmol/L Final   Potassium 10/23/2023 4.2  3.5 - 5.1 mmol/L Final   Chloride 10/23/2023 105  98 - 111 mmol/L Final   CO2 10/23/2023 26  22 - 32 mmol/L Final   Glucose, Bld 10/23/2023 65 (L)  70 - 99 mg/dL Final   Glucose reference range applies only to samples taken after fasting for at least 8 hours.   BUN 10/23/2023 8  6 - 20 mg/dL Final   Creatinine, Ser 10/23/2023 0.71  0.44 - 1.00 mg/dL Final   Calcium 52/84/1324 8.8 (L)  8.9 - 10.3 mg/dL Final   Total Protein 40/09/2724 7.4  6.5 - 8.1 g/dL Final   Albumin 36/64/4034 4.2  3.5 - 5.0 g/dL Final   AST 74/25/9563 39  15 - 41 U/L Final   ALT 10/23/2023 22  0 - 44 U/L Final   Alkaline Phosphatase 10/23/2023 136 (H)  38 - 126 U/L Final   Total Bilirubin 10/23/2023 0.5  <1.2 mg/dL Final   GFR, Estimated 10/23/2023 >60  >60 mL/min Final   Comment: (NOTE) Calculated using the CKD-EPI Creatinine Equation (2021)    Anion gap 10/23/2023 13  5 - 15 Final   Performed at [facility], [address] [place], [place] [address]   Hgb A1c MFr Bld 10/23/2023 5.2  4.8 - 5.6 % Final   Comment: (NOTE) Pre diabetes:          5.7%-6.4%  Diabetes:              >6.4%  Glycemic control for   <7.0% adults with diabetes    Mean Plasma Glucose 10/23/2023 102.54  mg/dL Final   Performed at [facility], [address],  [place], [place] [address]   Magnesium 10/23/2023 2.1  1.7 - 2.4 mg/dL Final   Performed at [facility], [address], [place], [place] [address]   Alcohol, Ethyl (B) 10/23/2023 255 (H)  <10 mg/dL Final   Comment: (NOTE) Lowest detectable limit for serum alcohol is 10  mg/dL.  For medical purposes only. Performed at [facility], [address] [place], [place] [address]    Cholesterol 10/23/2023 158  0 - 200 mg/dL Final   Triglycerides 19/14/7829 214 (H)  <150 mg/dL Final   HDL 56/21/3086 105  >40 mg/dL Final   Total CHOL/HDL Ratio 10/23/2023 1.5  RATIO Final   VLDL 10/23/2023 43 (H)  0 - 40 mg/dL Final   LDL Cholesterol 10/23/2023 10  0 - 99 mg/dL Final   Comment:        Total Cholesterol/HDL:CHD Risk Coronary Heart Disease Risk Table                     Men   Women  1/2 Average Risk   3.4   3.3  Average Risk       5.0   4.4  2 X Average Risk   9.6   7.1  3 X Average Risk  23.4   11.0        Use the calculated Patient Ratio above and the CHD Risk Table to determine the patient's CHD Risk.        ATP III CLASSIFICATION (LDL):  <100     mg/dL   Optimal  578-469  mg/dL   Near or Above                    Optimal  130-159  mg/dL   Borderline  629-528  mg/dL   High  >413     mg/dL   Very High Performed at [facility], [address], [place], [place] [address]    TSH 10/23/2023 0.306 (L)  0.350 - 4.500 uIU/mL Final   Comment: Performed by a 3rd Generation assay with a functional sensitivity of <=0.01 uIU/mL. Performed at [facility], [address], [place], [place] [address]    Glucose-Capillary 10/23/2023 89  70 - 99 mg/dL Final   Glucose reference range applies only to samples taken after fasting for at least 8 hours.  Admission on 08/20/2023, Discharged on 08/21/2023  Component Date Value Ref Range Status   Sodium 08/20/2023 138  135 - 145 mmol/L Final   Potassium 08/20/2023 3.8  3.5 - 5.1 mmol/L Final   Chloride 08/20/2023 106  98 - 111 mmol/L  Final   CO2 08/20/2023 22  22 - 32 mmol/L Final   Glucose, Bld 08/20/2023 83  70 - 99 mg/dL Final   Glucose reference range applies only to samples taken after fasting for at least 8 hours.   BUN 08/20/2023 12  6 - 20 mg/dL Final   Creatinine, Ser 08/20/2023 0.60  0.44 - 1.00 mg/dL Final   Calcium 36/64/4034 8.3 (L)  8.9 - 10.3 mg/dL Final   Total Protein 74/25/9563 7.4  6.5 - 8.1 g/dL Final   Albumin 87/56/4332 3.5  3.5 - 5.0 g/dL Final   AST 95/18/8416 23  15 - 41 U/L Final   ALT 08/20/2023 23  0 - 44 U/L Final   Alkaline Phosphatase 08/20/2023 130 (H)  38 - 126 U/L Final   Total Bilirubin 08/20/2023 0.3  0.3 - 1.2 mg/dL Final   GFR, Estimated 08/20/2023 >60  >60 mL/min Final   Comment: (NOTE) Calculated using the CKD-EPI Creatinine Equation (2021)    Anion gap 08/20/2023 10  5 - 15 Final   Performed at [facility], [address], [place], [place] [address]   Alcohol, Ethyl (B) 08/20/2023  214 (H)  <10 mg/dL Final   Comment: (NOTE) Lowest detectable limit for serum alcohol is 10 mg/dL.  For medical purposes only. Performed at [facility], [address] [place], [place] [address]    Salicylate Lvl 08/20/2023 17.8  7.0 - 30.0 mg/dL Final   Performed at [name], [address], [place], [place] [address]   Acetaminophen (Tylenol), Serum 08/20/2023 <10 (L)  10 - 30 ug/mL Final   Comment: (NOTE) Therapeutic concentrations vary significantly. A range of 10-30 ug/mL  may be an effective concentration for many patients. However, some  are best treated at concentrations outside of this range. Acetaminophen concentrations >150 ug/mL at 4 hours after ingestion  and >50 ug/mL at 12 hours after ingestion are often associated with  toxic reactions.  Performed at [facility], [address], [place], [place] [address]    WBC 08/20/2023 6.4  4.0 - 10.5 K/uL Final   RBC 08/20/2023 4.26  3.87 - 5.11 MIL/uL  Final   Hemoglobin 08/20/2023 12.9  12.0 - 15.0 g/dL Final   HCT 44/12/270 40.7  36.0 - 46.0 % Final   MCV 08/20/2023 95.5  80.0 - 100.0 fL Final   MCH 08/20/2023 30.3  26.0 - 34.0 pg Final   MCHC 08/20/2023 31.7  30.0 - 36.0 g/dL Final   RDW 53/66/4403 12.8  11.5 - 15.5 % Final   Platelets 08/20/2023 332  150 - 400 K/uL Final   nRBC 08/20/2023 0.0  0.0 - 0.2 % Final   Performed at [facility], [address] [place], [place] [address]   Opiates 08/20/2023 NONE DETECTED  NONE DETECTED Final   Cocaine 08/20/2023 NONE DETECTED  NONE DETECTED Final   Benzodiazepines 08/20/2023 NONE DETECTED  NONE DETECTED Final   Amphetamines 08/20/2023 NONE DETECTED  NONE DETECTED Final   Tetrahydrocannabinol 08/20/2023 NONE DETECTED  NONE DETECTED Final   Barbiturates 08/20/2023 NONE DETECTED  NONE DETECTED Final   Comment: (NOTE) DRUG SCREEN FOR MEDICAL PURPOSES ONLY.  IF CONFIRMATION IS NEEDED FOR ANY PURPOSE, NOTIFY LAB WITHIN 5 DAYS.  LOWEST DETECTABLE LIMITS FOR URINE DRUG SCREEN Drug Class                     Cutoff (ng/mL) Amphetamine and metabolites    1000 Barbiturate and metabolites    200 Benzodiazepine                 200 Opiates and metabolites        300 Cocaine and metabolites        300 THC                            50 Performed at [facility], [address], [place], [place] [address]    SARS Coronavirus 2 by RT PCR 08/21/2023 NEGATIVE  NEGATIVE Final   Comment: (NOTE) SARS-CoV-2 target nucleic acids are NOT DETECTED.  The SARS-CoV-2 RNA is generally detectable in upper and lower respiratory specimens during the acute phase of infection. The lowest concentration of SARS-CoV-2 viral copies this assay can detect is 250 copies / mL. A negative result does not preclude SARS-CoV-2 infection and should not be used as the sole basis for treatment or other patient management decisions.  A negative result may occur with improper specimen  collection / handling, submission of specimen other than nasopharyngeal swab, presence of viral mutation(s) within the  areas targeted by this assay, and inadequate number of viral copies (<250 copies / mL). A negative result must be combined with clinical observations, patient history, and epidemiological information.  Fact Sheet for Patients:   RoadLapTop.co.za  Fact Sheet for Healthcare Providers: http://kim-miller.com/  This test is not yet approved or                           cleared by the [place] FDA and has been authorized for detection and/or diagnosis of SARS-CoV-2 by FDA under an Emergency Use Authorization (EUA).  This EUA will remain in effect (meaning this test can be used) for the duration of the COVID-19 declaration under Section 564(b)(1) of the Act, 21 U.S.C. section 360bbb-3(b)(1), unless the authorization is terminated or revoked sooner.  Performed at [facility], [address], [place], [place] [address]     Allergies: Codeine and Hydrocodone  Medications:  Facility Ordered Medications  Medication   acetaminophen (TYLENOL) tablet 650 mg   alum & mag hydroxide-simeth (MAALOX/MYLANTA) 200-200-20 MG/5ML suspension 30 mL   traZODone (DESYREL) tablet 50 mg   multivitamin with minerals tablet 1 tablet   chlordiazePOXIDE (LIBRIUM) capsule 25 mg   hydrOXYzine (ATARAX) tablet 25 mg   loperamide (IMODIUM) capsule 2-4 mg   ondansetron (ZOFRAN-ODT) disintegrating tablet 4 mg   umeclidinium-vilanterol (ANORO ELLIPTA) 62.5-25 MCG/ACT 1 puff   albuterol (PROVENTIL) (2.5 MG/3ML) 0.083% nebulizer solution 2.5 mg   gabapentin (NEURONTIN) capsule 100 mg   nicotine (NICODERM CQ - dosed in mg/24 hours) patch 14 mg   chlordiazePOXIDE (LIBRIUM) capsule 25 mg   Followed by   Melene Muller ON 10/25/2023] chlordiazePOXIDE (LIBRIUM) capsule 25 mg   Followed by   Melene Muller ON 10/26/2023] chlordiazePOXIDE (LIBRIUM)  capsule 25 mg   acamprosate (CAMPRAL) tablet 666 mg   PTA Medications  Medication Sig   thiamine (VITAMIN B-1) 100 MG tablet Take 1 tablet (100 mg total) by mouth daily.   albuterol (ACCUNEB) 1.25 MG/3ML nebulizer solution Take 1 ampule by nebulization 3 (three) times daily as needed for wheezing or shortness of breath.   Turmeric (QC TUMERIC COMPLEX PO) Take 2 capsules by mouth daily.   Multiple Vitamin (MULTIVITAMIN WITH MINERALS) TABS tablet Take 1 tablet by mouth daily.   SUMAtriptan (IMITREX) 100 MG tablet Take 1 tablet (100 mg total) by mouth every 2 (two) hours as needed for migraine. May repeat in 2 hours if headache persists or recurs. Do not exceed 2 tablets within a 24 hour period. (Patient taking differently: Take 100 mg by mouth every 2 (two) hours as needed for migraine (May repeat in 2 hours if headache persists or recurs. Do not exceed 2 tablets within a 24 hour period.).)   fluticasone (FLONASE) 50 MCG/ACT nasal spray Place 1 spray into both nostrils 2 (two) times daily.   Aspirin-Salicylamide-Caffeine (BC HEADACHE POWDER PO) Take 1 packet by mouth as needed (for headaches or pain).   ANORO ELLIPTA 62.5-25 MCG/ACT AEPB Inhale 1 puff into the lungs daily.   propranolol (INDERAL) 10 MG tablet Take 1 tablet (10 mg total) by mouth 3 (three) times daily as needed (anxiety).   hydrOXYzine (ATARAX) 25 MG tablet Take 1 tablet (25 mg total) by mouth every 6 (six) hours as needed (anxiety/agitation or CIWA < or = 10).   nicotine (NICODERM CQ - DOSED IN MG/24 HOURS) 14 mg/24hr patch Place 1 patch (14 mg total) onto the skin daily.   cyclobenzaprine (FLEXERIL) 5  MG tablet Take 1 tablet (5 mg total) by mouth 3 (three) times daily as needed for muscle spasms.   gabapentin (NEURONTIN) 300 MG capsule Take 2 capsules (600 mg total) by mouth 2 (two) times daily.   lamoTRIgine (LAMICTAL) 100 MG tablet Take 1 tablet (100 mg total) by mouth 2 (two) times daily.   triamcinolone cream (KENALOG) 0.5 %  Apply topically 2 (two) times daily.   traZODone (DESYREL) 50 MG tablet Take 1 tablet (50 mg total) by mouth at bedtime.    Long Term Goals: Improvement in symptoms so as ready for discharge  Short Term Goals: Patient will verbalize feelings in meetings with treatment team members., Patient will attend at least of 50% of the groups daily., Pt will complete the PHQ9 on admission, day 3 and discharge., Patient will score a low risk of violence for 24 hours prior to discharge, and Patient will take medications as prescribed daily.  Medical Decision Making  Ms. Vibha Donaho is a 57 yo female with a past psychiatric history of alcohol use disorder, MDD, tobacco use disorder, multiple inpatient psychiatric hospitalizations (most recent [facility] 08/2023), multiple rehab stays (most recent [place] rehab last July-September) who presents to the [facility] requesting detox from alcohol.   Patient meets criteria for alcohol use disorder especially given past psychiatric history, history of rehab. Given withdrawal symptoms prior to arrival as well as receiving librium at [facility], will continue librium taper at [facility]. LFTs wnl. History of receiving naltrexone and acamprosate but reporting side effects from naltrexone, will restart acamprosate. Most recent CIWA 0, 1. Will continue to monitor for sx of withdrawal. Also reporting tobacco use, will give NRT. Prior history of MDD but non-compliant with medications, will restart gabapentin for withdrawal and anxiety. Will hold off on restarting prozac at this time and will not restart lamictal at prior dose given patient has been off of lamictal for the past month. Patient needs UDS. Recommending residential for patient but patient is wanting to do CDIOP.   #Alcohol use disorder -CIWA -continue librium taper (11/20-11/22) -PRN librium for CIWA >10  -PRN: atarax, tylenol, maalox, imodium, milk of mg, zofran -daily MV, thiamine  -start acamprosate 666 TID for alcohol use d/o -start  gabapentin 100 TID for anxiety, alcohol withdrawal -f/u UDS   #Substance-induced mood d/o -hold off on restarting home prozac for now given anxiety from alcohol w/d  -d/c lamictal given hx non-compliance  -d/c PRN propanolol given asthma   #Tobacco use disorder  -nicotine patch   #Low TSH  -repeat TSH tmrw   #Asthma  -albuterol PRN -home anoro ellipta   Dispo: patient wants CDIOP, will attempt to set up appointment on Friday    Recommendations  Based on my evaluation the patient does not appear to have an emergency medical condition.  Karie Fetch, MD, PGY-2 10/24/23  9:02 AM

## 2023-10-24 NOTE — Group Note (Signed)
Group Topic: Relapse and Recovery  Group Date: 10/24/2023 Start Time: 2000 End Time: 2100 Facilitators: Rae Lips B  Department: [facility]  Number of Participants: 3  Group Focus: acceptance, activities of daily living skills, chemical dependency education, co-dependency, and communication Treatment Modality:  Leisure Development Interventions utilized were leisure development, problem solving, story telling, and support Purpose: express feelings  Name: [name] Date of Birth: [birth year]  MR: [id number]    Level of Participation: PT DID NOT ATTEND GROUP Quality of Participation: cooperative Interactions with others: gave feedback Mood/Affect: appropriate Triggers (if applicable): NA Cognition: coherent/clear Progress: None Response: NA Plan: patient will be encouraged to go to groups.   Patients Problems:  Patient Active Problem List   Diagnosis Date Noted   Major depressive disorder, 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/22/2023   Alcohol withdrawal (HCC) 04/10/2023   Asthma exacerbation 04/09/2023   Alcohol use disorder 12/26/2022   Asthma 12/26/2022   MDD (major depressive disorder), recurrent episode, severe (HCC) 12/25/2022   Depression, unspecified 01/06/2021   Alcohol use 01/06/2021   Insomnia 06/18/2020   SUI (stress urinary incontinence, female)    Hypertension    History of anemia    GERD (gastroesophageal reflux disease)    History of alcoholism (HCC) 02/25/2020   Intractable migraine with aura without status migrainosus 02/25/2020   Attention deficit hyperactivity disorder (ADHD), predominantly inattentive type 01/07/2020   GAD (generalized anxiety disorder) 01/07/2020   MDD (major depressive disorder), recurrent severe, without psychosis (HCC) 01/07/2020   Diverticulitis with abscess/fistula s/p robotic left colectomy 06/16/2020 09/06/2019   MDD (major depressive disorder) 10/07/2015   Alcohol abuse  with alcohol-induced mood disorder (HCC) 10/06/2015

## 2023-10-24 NOTE — ED Notes (Signed)
Patient observed/assessed at bedside lying in bed asleep. Patient alert and oriented to self and location. Affect is flat denies pain and anxiety. He denies A/V/H. He denies having any thoughts/plan of self harm and harm towards others.  Patient states that appetite has been good throughout the day. Verbalizes no further complaints at this time. Will continue to monitor and support.

## 2023-10-25 DIAGNOSIS — Z91148 Patient's other noncompliance with medication regimen for other reason: Secondary | ICD-10-CM | POA: Diagnosis not present

## 2023-10-25 DIAGNOSIS — F1090 Alcohol use, unspecified, uncomplicated: Secondary | ICD-10-CM | POA: Diagnosis not present

## 2023-10-25 DIAGNOSIS — F1014 Alcohol abuse with alcohol-induced mood disorder: Secondary | ICD-10-CM | POA: Diagnosis present

## 2023-10-25 DIAGNOSIS — F329 Major depressive disorder, single episode, unspecified: Secondary | ICD-10-CM | POA: Diagnosis not present

## 2023-10-25 DIAGNOSIS — F411 Generalized anxiety disorder: Secondary | ICD-10-CM | POA: Diagnosis not present

## 2023-10-25 DIAGNOSIS — F17200 Nicotine dependence, unspecified, uncomplicated: Secondary | ICD-10-CM | POA: Diagnosis not present

## 2023-10-25 LAB — RAPID URINE DRUG SCREEN, HOSP PERFORMED
Amphetamines: NOT DETECTED
Barbiturates: NOT DETECTED
Benzodiazepines: POSITIVE — AB
Cocaine: NOT DETECTED
Opiates: NOT DETECTED
Tetrahydrocannabinol: NOT DETECTED

## 2023-10-25 LAB — TSH: TSH: 0.454 u[IU]/mL (ref 0.350–4.500)

## 2023-10-25 LAB — PROLACTIN: Prolactin: 9.7 ng/mL (ref 3.6–25.2)

## 2023-10-25 MED ORDER — TRAZODONE HCL 50 MG PO TABS
50.0000 mg | ORAL_TABLET | Freq: Every evening | ORAL | Status: DC | PRN
  Administered 2023-10-25: 50 mg via ORAL
  Filled 2023-10-25: qty 1

## 2023-10-25 MED ORDER — ALBUTEROL SULFATE HFA 108 (90 BASE) MCG/ACT IN AERS
1.0000 | INHALATION_SPRAY | Freq: Four times a day (QID) | RESPIRATORY_TRACT | Status: DC | PRN
  Administered 2023-10-25: 2 via RESPIRATORY_TRACT
  Filled 2023-10-25: qty 6.7

## 2023-10-25 MED ORDER — FLUOXETINE HCL 20 MG PO CAPS
20.0000 mg | ORAL_CAPSULE | Freq: Every day | ORAL | Status: DC
  Administered 2023-10-25 – 2023-10-26 (×2): 20 mg via ORAL
  Filled 2023-10-25 (×2): qty 1

## 2023-10-25 NOTE — Group Note (Signed)
Group Topic: Positive Affirmations  Group Date: 10/25/2023 Start Time: 1115 End Time: 1200 Facilitators: Cassandria Anger  Department: [facility]  Number of Participants: 3  Group Focus: affirmation Treatment Modality:  Psychoeducation Interventions utilized were patient education Purpose: increase insight  Name: [name] Date of Birth: [birth year]  MR: [id number]    Level of Participation: Patient did not attend group Quality of Participation: Patient did not attend group Interactions with others: N/A Mood/Affect: N/A Triggers (if applicable): N/A Cognition: N/A Progress: None Response: N/A Plan: follow-up needed  Patients Problems:  Patient Active Problem List   Diagnosis Date Noted   Major depressive disorder, 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/22/2023   Alcohol withdrawal (HCC) 04/10/2023   Asthma exacerbation 04/09/2023   Alcohol use disorder 12/26/2022   Asthma 12/26/2022   MDD (major depressive disorder), recurrent episode, severe (HCC) 12/25/2022   Depression, unspecified 01/06/2021   Alcohol use 01/06/2021   Insomnia 06/18/2020   SUI (stress urinary incontinence, female)    Hypertension    History of anemia    GERD (gastroesophageal reflux disease)    History of alcoholism (HCC) 02/25/2020   Intractable migraine with aura without status migrainosus 02/25/2020   Attention deficit hyperactivity disorder (ADHD), predominantly inattentive type 01/07/2020   GAD (generalized anxiety disorder) 01/07/2020   MDD (major depressive disorder), recurrent severe, without psychosis (HCC) 01/07/2020   Diverticulitis with abscess/fistula s/p robotic left colectomy 06/16/2020 09/06/2019   MDD (major depressive disorder) 10/07/2015   Alcohol abuse with alcohol-induced mood disorder (HCC) 10/06/2015

## 2023-10-25 NOTE — ED Notes (Signed)
Patient was provided lunch.

## 2023-10-25 NOTE — ED Notes (Signed)
Patient is alert & oriented, calm & cooperative. She denies SI/HI or AVH. Patient agrees to notify staff should thoughts of self harm arise.Pt interacts with peers and staff minimally, yet, appropriately. She has taken her medications without incident. She has rested frequently. Patient complains of rib pain due to a fall a few days ago. Pain medication provided and was efficacious. She notes no additional needs at this time. Will continue to monitor for safety.

## 2023-10-25 NOTE — ED Notes (Signed)
Patient was provided breakfast

## 2023-10-25 NOTE — Group Note (Signed)
Group Topic: Wellness  Group Date: 10/25/2023 Start Time: 1200 End Time: 1215 Facilitators: Orpah Hausner, Jacklynn Barnacle, RN  Department: [facility]  Number of Participants: 4  Group Focus: nutrition Treatment Modality:  Patient-Centered Therapy Interventions utilized were exploration and patient education Purpose: increase insight  Name: [name] Date of Birth: 09-25-1966  MR: [id number]    Level of Participation: minimal Quality of Participation: attentive Interactions with others: minimal Mood/Affect: appropriate Triggers (if applicable): n/a Cognition: coherent/clear Progress: Minimal Response: patient participated little but listened actively throughout Plan: patient will be encouraged to consider dietary options and consider healthy choices  Patients Problems:  Patient Active Problem List   Diagnosis Date Noted   Major depressive disorder, 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/22/2023   Alcohol withdrawal (HCC) 04/10/2023   Asthma exacerbation 04/09/2023   Alcohol use disorder 12/26/2022   Asthma 12/26/2022   MDD (major depressive disorder), recurrent episode, severe (HCC) 12/25/2022   Depression, unspecified 01/06/2021   Alcohol use 01/06/2021   Insomnia 06/18/2020   SUI (stress urinary incontinence, female)    Hypertension    History of anemia    GERD (gastroesophageal reflux disease)    History of alcoholism (HCC) 02/25/2020   Intractable migraine with aura without status migrainosus 02/25/2020   Attention deficit hyperactivity disorder (ADHD), predominantly inattentive type 01/07/2020   GAD (generalized anxiety disorder) 01/07/2020   MDD (major depressive disorder), recurrent severe, without psychosis (HCC) 01/07/2020   Diverticulitis with abscess/fistula s/p robotic left colectomy 06/16/2020 09/06/2019   MDD (major depressive disorder) 10/07/2015   Alcohol abuse with alcohol-induced mood disorder (HCC) 10/06/2015

## 2023-10-25 NOTE — Group Note (Signed)
Group Topic: Balance in Life  Group Date: 10/25/2023 Start Time: 0730 End Time: 0800 Facilitators: Hilma Favors, RN  Department: [name]  Number of Participants: 4  Group Focus: personal responsibility Treatment Modality:  Family Therapy Interventions utilized were clarification Purpose: enhance coping skills  Name: [name] Date of Birth: December 20, 1965  MR: [id number]    Level of Participation: n/amoderate Quality of Participation: cooperative Interactions with others: gave feedback Mood/Affect: appropriate Triggers (if applicable): n/a Cognition: coherent/clear Progress: Moderate Response: good Plan: patient will be encouraged to attend.  Patients Problems:  Patient Active Problem List   Diagnosis Date Noted   Major depressive disorder, 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/22/2023   Alcohol withdrawal (HCC) 04/10/2023   Asthma exacerbation 04/09/2023   Alcohol use disorder 12/26/2022   Asthma 12/26/2022   MDD (major depressive disorder), recurrent episode, severe (HCC) 12/25/2022   Depression, unspecified 01/06/2021   Alcohol use 01/06/2021   Insomnia 06/18/2020   SUI (stress urinary incontinence, female)    Hypertension    History of anemia    GERD (gastroesophageal reflux disease)    History of alcoholism (HCC) 02/25/2020   Intractable migraine with aura without status migrainosus 02/25/2020   Attention deficit hyperactivity disorder (ADHD), predominantly inattentive type 01/07/2020   GAD (generalized anxiety disorder) 01/07/2020   MDD (major depressive disorder), recurrent severe, without psychosis (HCC) 01/07/2020   Diverticulitis with abscess/fistula s/p robotic left colectomy 06/16/2020 09/06/2019   MDD (major depressive disorder) 10/07/2015   Alcohol abuse with alcohol-induced mood disorder (HCC) 10/06/2015

## 2023-10-25 NOTE — ED Notes (Signed)
Patient is sleeping. Respirations equal and unlabored, skin warm and dry. No change in assessment or acuity. Routine safety checks conducted according to facility protocol. Will continue to monitor for safety.   

## 2023-10-25 NOTE — ED Notes (Signed)
Pt in room laying in bed calm and cooperative pt stated she has had diarrhea today and was given imodium(see Mar) will continue to monitor for safety

## 2023-10-25 NOTE — ED Notes (Signed)
Pt sleeping@this time breathing even and unlabored will continue to monitor for safety 

## 2023-10-25 NOTE — ED Notes (Signed)
 Patient was provided dinner

## 2023-10-25 NOTE — ED Notes (Signed)
Patient observed resting quietly, eyes closed. Respirations equal and unlabored. Will continue to monitor for safety.  

## 2023-10-25 NOTE — ED Notes (Signed)
Pt observed sitting the dining room. She is calm and cooperative. Denies any needs at this moment. Will continue to monitor for safety.

## 2023-10-25 NOTE — ED Provider Notes (Signed)
Behavioral Health Progress Note  Date and Time: 10/25/2023 11:52 AM Name: [name] MRN:  [id number]  Subjective:  Patient is seen in treatment team room this AM. She reports her mood is hopeful and positive. She reports increased grogginess from getting the trazodone and librium last night. Discussed will make trazodone PRN. She denies any other withdrawal symptoms or other side effects from restarting medications. She reports desire to discharge tomorrow, states that she will stay with her friend or her friend will come stay with her before she goes to CDIOP appointment on Monday. She denies SI/HI/AVH.   Diagnosis:  Final diagnoses:  Alcohol use disorder  Substance induced mood disorder (HCC)  Tobacco use disorder   Past Psychiatric History: Past psychiatric history of MDD and GAD. She reports she was on prozac, lamictal, gabapentin and has also been on acamprosate in the past. She reports she was unable to get appointment with outpatient psychiatrist (Dr. Maggie Schwalbe) for the past month to refill medications. She reports she was on prozac 80, gabapentin 600 BID, lamictal 100 BID. Not on propranolol. Per chart review, patient has had at least 5 psychiatric hospitalizations (10/2015, 12/2015, 01/2021, 12/2022, 08/2023). Most recent at [facility] 08/2023 for suicidal ideations. She has had prior medication trials including prozac, buspar, gabapentin, proprnaolol, lexapro, viibyrd, zoloft, lamictal, campral, naltrexone (makes her nauseous), ambien, amphetamine. She has been non-compliant with medications.   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 She reports her support system is her daughter (lives in [place]), mom (lives across the street), and her friend. She is divorced.   Additional Social History:                         Sleep: Good  Appetite:  Good  Current Medications:  Current  Facility-Administered Medications  Medication Dose Route Frequency Provider Last Rate Last Admin   acamprosate (CAMPRAL) tablet 666 mg  666 mg Oral TID Karie Fetch, MD   666 mg at 10/25/23 0931   acetaminophen (TYLENOL) tablet 650 mg  650 mg Oral Q6H PRN Rankin, Shuvon B, NP   650 mg at 10/25/23 0930   albuterol (VENTOLIN HFA) 108 (90 Base) MCG/ACT inhaler 1-2 puff  1-2 puff Inhalation Q6H PRN Karie Fetch, MD       alum & mag hydroxide-simeth (MAALOX/MYLANTA) 200-200-20 MG/5ML suspension 30 mL  30 mL Oral Q4H PRN Rankin, Shuvon B, NP       chlordiazePOXIDE (LIBRIUM) capsule 25 mg  25 mg Oral Q6H PRN Rankin, Shuvon B, NP       chlordiazePOXIDE (LIBRIUM) capsule 25 mg  25 mg Oral BID Karie Fetch, MD   25 mg at 10/25/23 0930   Followed by   Melene Muller ON 10/26/2023] chlordiazePOXIDE (LIBRIUM) capsule 25 mg  25 mg Oral Daily Karie Fetch, MD       FLUoxetine (PROZAC) capsule 20 mg  20 mg Oral Daily Karie Fetch, MD       gabapentin (NEURONTIN) capsule 100 mg  100 mg Oral TID Karie Fetch, MD   100 mg at 10/25/23 0930   hydrOXYzine (ATARAX) tablet 25 mg  25 mg Oral Q6H PRN Rankin, Shuvon B, NP       loperamide (IMODIUM) capsule 2-4 mg  2-4 mg Oral PRN Rankin, Shuvon B, NP       multivitamin with minerals tablet 1 tablet  1 tablet Oral Daily  Rankin, Shuvon B, NP   1 tablet at 10/25/23 0930   nicotine (NICODERM CQ - dosed in mg/24 hours) patch 14 mg  14 mg Transdermal Daily Karie Fetch, MD   14 mg at 10/25/23 0934   ondansetron (ZOFRAN-ODT) disintegrating tablet 4 mg  4 mg Oral Q6H PRN Rankin, Shuvon B, NP       traZODone (DESYREL) tablet 50 mg  50 mg Oral QHS PRN Karie Fetch, MD       umeclidinium-vilanterol [facility] ELLIPTA) 62.5-25 MCG/ACT 1 puff  1 puff Inhalation Daily Sindy Guadeloupe, NP   1 puff at 10/25/23 1610   Current Outpatient Medications  Medication Sig Dispense Refill   acetaminophen (TYLENOL) 500 MG tablet Take 1,000 mg by mouth every 6 (six) hours as  needed (For pain).     albuterol (ACCUNEB) 1.25 MG/3ML nebulizer solution Take 1 ampule by nebulization 3 (three) times daily as needed for wheezing or shortness of breath.     albuterol (VENTOLIN HFA) 108 (90 Base) MCG/ACT inhaler Inhale 1-2 puffs into the lungs every 6 (six) hours as needed for wheezing.     ANORO ELLIPTA 62.5-25 MCG/ACT AEPB Inhale 1 puff into the lungs daily.     Aspirin-Salicylamide-Caffeine (BC HEADACHE POWDER PO) Take 1 packet by mouth as needed (for headaches or pain).     busPIRone (BUSPAR) 15 MG tablet Take 7.5 mg by mouth 2 (two) times daily.     cloNIDine (CATAPRES) 0.1 MG tablet Take 0.1 mg by mouth 2 (two) times daily as needed (For anxiety).     cyclobenzaprine (FLEXERIL) 5 MG tablet Take 1 tablet (5 mg total) by mouth 3 (three) times daily as needed for muscle spasms. 30 tablet 0   DUPIXENT 300 MG/2ML SOAJ Inject 300 mg into the skin every 14 (fourteen) days.     FLUoxetine (PROZAC) 40 MG capsule Take 40 mg by mouth daily.     fluticasone (FLONASE) 50 MCG/ACT nasal spray Place 2 sprays into both nostrils daily.     folic acid (FOLVITE) 1 MG tablet Take 1 mg by mouth daily.     gabapentin (NEURONTIN) 600 MG tablet Take 600 mg by mouth 2 (two) times daily.     hydrOXYzine (ATARAX) 25 MG tablet Take 1 tablet (25 mg total) by mouth every 6 (six) hours as needed (anxiety/agitation or CIWA < or = 10). 30 tablet 0   ibuprofen (ADVIL) 200 MG tablet Take 800 mg by mouth every 6 (six) hours as needed (For pain).     lamoTRIgine (LAMICTAL) 100 MG tablet Take 1 tablet (100 mg total) by mouth 2 (two) times daily. 60 tablet 0   montelukast (SINGULAIR) 10 MG tablet Take 10 mg by mouth daily.     nicotine (NICODERM CQ - DOSED IN MG/24 HOURS) 14 mg/24hr patch Place 1 patch (14 mg total) onto the skin daily. 28 patch 0   progesterone (PROMETRIUM) 100 MG capsule Take 200 mg by mouth at bedtime.     SUMAtriptan (IMITREX) 100 MG tablet Take 1 tablet (100 mg total) by mouth every 2  (two) hours as needed for migraine. May repeat in 2 hours if headache persists or recurs. Do not exceed 2 tablets within a 24 hour period. (Patient taking differently: Take 100 mg by mouth every 2 (two) hours as needed for migraine (May repeat in 2 hours if headache persists or recurs. Do not exceed 2 tablets within a 24 hour period.).) 10 tablet 0   thiamine (VITAMIN B-1) 100 MG  tablet Take 1 tablet (100 mg total) by mouth daily. 30 tablet 0   traZODone (DESYREL) 50 MG tablet Take 1 tablet (50 mg total) by mouth at bedtime. 30 tablet 0   triamcinolone cream (KENALOG) 0.5 % Apply topically 2 (two) times daily. (Patient taking differently: Apply 1 Application topically 2 (two) times daily as needed (Apply to affected area).) 30 g 0   Turmeric (QC TUMERIC COMPLEX PO) Take 2 capsules by mouth daily.      Labs  Lab Results:  Admission on 10/24/2023  Component Date Value Ref Range Status   Opiates 10/25/2023 NONE DETECTED  NONE DETECTED Final   Cocaine 10/25/2023 NONE DETECTED  NONE DETECTED Final   Benzodiazepines 10/25/2023 POSITIVE (A)  NONE DETECTED Final   Amphetamines 10/25/2023 NONE DETECTED  NONE DETECTED Final   Tetrahydrocannabinol 10/25/2023 NONE DETECTED  NONE DETECTED Final   Barbiturates 10/25/2023 NONE DETECTED  NONE DETECTED Final   Comment: (NOTE) DRUG SCREEN FOR MEDICAL PURPOSES ONLY.  IF CONFIRMATION IS NEEDED FOR ANY PURPOSE, NOTIFY LAB WITHIN 5 DAYS.  LOWEST DETECTABLE LIMITS FOR URINE DRUG SCREEN Drug Class                     Cutoff (ng/mL) Amphetamine and metabolites    1000 Barbiturate and metabolites    200 Benzodiazepine                 200 Opiates and metabolites        300 Cocaine and metabolites        300 THC                            50 Performed at [facility], [address] [place], [place] [address]    TSH 10/25/2023 0.454  0.350 - 4.500 uIU/mL Final   Comment: Performed by a 3rd Generation assay with a functional sensitivity of <=0.01  uIU/mL. Performed at [facility], [address] [place], [place] [address]   Admission on 10/23/2023, Discharged on 10/23/2023  Component Date Value Ref Range Status   WBC 10/23/2023 6.0  4.0 - 10.5 K/uL Final   RBC 10/23/2023 4.87  3.87 - 5.11 MIL/uL Final   Hemoglobin 10/23/2023 15.3 (H)  12.0 - 15.0 g/dL Final   HCT 73/71/0626 46.8 (H)  36.0 - 46.0 % Final   MCV 10/23/2023 96.1  80.0 - 100.0 fL Final   MCH 10/23/2023 31.4  26.0 - 34.0 pg Final   MCHC 10/23/2023 32.7  30.0 - 36.0 g/dL Final   RDW 94/85/4627 14.5  11.5 - 15.5 % Final   Platelets 10/23/2023 243  150 - 400 K/uL Final   nRBC 10/23/2023 0.0  0.0 - 0.2 % Final   Neutrophils Relative % 10/23/2023 43  % Final   Neutro Abs 10/23/2023 2.6  1.7 - 7.7 K/uL Final   Lymphocytes Relative 10/23/2023 47  % Final   Lymphs Abs 10/23/2023 2.8  0.7 - 4.0 K/uL Final   Monocytes Relative 10/23/2023 6  % Final   Monocytes Absolute 10/23/2023 0.4  0.1 - 1.0 K/uL Final   Eosinophils Relative 10/23/2023 3  % Final   Eosinophils Absolute 10/23/2023 0.2  0.0 - 0.5 K/uL Final   Basophils Relative 10/23/2023 1  % Final   Basophils Absolute 10/23/2023 0.1  0.0 - 0.1 K/uL Final   Immature Granulocytes 10/23/2023 0  % Final   Abs Immature Granulocytes 10/23/2023  0.01  0.00 - 0.07 K/uL Final   Performed at [facility], [address], [place], [place] [address]   Sodium 10/23/2023 144  135 - 145 mmol/L Final   Potassium 10/23/2023 4.2  3.5 - 5.1 mmol/L Final   Chloride 10/23/2023 105  98 - 111 mmol/L Final   CO2 10/23/2023 26  22 - 32 mmol/L Final   Glucose, Bld 10/23/2023 65 (L)  70 - 99 mg/dL Final   Glucose reference range applies only to samples taken after fasting for at least 8 hours.   BUN 10/23/2023 8  6 - 20 mg/dL Final   Creatinine, Ser 10/23/2023 0.71  0.44 - 1.00 mg/dL Final   Calcium 32/44/0102 8.8 (L)  8.9 - 10.3 mg/dL Final   Total Protein 72/53/6644 7.4  6.5 - 8.1 g/dL Final   Albumin 03/47/4259 4.2  3.5 - 5.0  g/dL Final   AST 56/38/7564 39  15 - 41 U/L Final   ALT 10/23/2023 22  0 - 44 U/L Final   Alkaline Phosphatase 10/23/2023 136 (H)  38 - 126 U/L Final   Total Bilirubin 10/23/2023 0.5  <1.2 mg/dL Final   GFR, Estimated 10/23/2023 >60  >60 mL/min Final   Comment: (NOTE) Calculated using the CKD-EPI Creatinine Equation (2021)    Anion gap 10/23/2023 13  5 - 15 Final   Performed at [facility], [address] [place], [place] [address]   Hgb A1c MFr Bld 10/23/2023 5.2  4.8 - 5.6 % Final   Comment: (NOTE) Pre diabetes:          5.7%-6.4%  Diabetes:              >6.4%  Glycemic control for   <7.0% adults with diabetes    Mean Plasma Glucose 10/23/2023 102.54  mg/dL Final   Performed at [facility], [address], [place], [place] [address]   Magnesium 10/23/2023 2.1  1.7 - 2.4 mg/dL Final   Performed at [facility], [address] [place], [place] [address]   Alcohol, Ethyl (B) 10/23/2023 255 (H)  <10 mg/dL Final   Comment: (NOTE) Lowest detectable limit for serum alcohol is 10 mg/dL.  For medical purposes only. Performed at [facility], [address] [place], [place] [address]    Cholesterol 10/23/2023 158  0 - 200 mg/dL Final   Triglycerides 09/32/3557 214 (H)  <150 mg/dL Final   HDL 32/20/2542 105  >40 mg/dL Final   Total CHOL/HDL Ratio 10/23/2023 1.5  RATIO Final   VLDL 10/23/2023 43 (H)  0 - 40 mg/dL Final   LDL Cholesterol 10/23/2023 10  0 - 99 mg/dL Final   Comment:        Total Cholesterol/HDL:CHD Risk Coronary Heart Disease Risk Table                     Men   Women  1/2 Average Risk   3.4   3.3  Average Risk       5.0   4.4  2 X Average Risk   9.6   7.1  3 X Average Risk  23.4   11.0        Use the calculated Patient Ratio above and the CHD Risk Table to determine the patient's CHD Risk.        ATP III CLASSIFICATION (LDL):  <100     mg/dL   Optimal  706-237  mg/dL  Near or Above                    Optimal  130-159  mg/dL    Borderline  409-811  mg/dL   High  >914     mg/dL   Very High Performed at [facility], [address] [place], [place] [address]    TSH 10/23/2023 0.306 (L)  0.350 - 4.500 uIU/mL Final   Comment: Performed by a 3rd Generation assay with a functional sensitivity of <=0.01 uIU/mL. Performed at [facility], [address], [place], [place] [address]    Prolactin 10/23/2023 9.7  3.6 - 25.2 ng/mL Final   Comment: (NOTE) Performed At: [facility] [address] [place], [place] [id number] Jolene Schimke MD EX:[id number]    Glucose-Capillary 10/23/2023 89  70 - 99 mg/dL Final   Glucose reference range applies only to samples taken after fasting for at least 8 hours.  Admission on 08/20/2023, Discharged on 08/21/2023  Component Date Value Ref Range Status   Sodium 08/20/2023 138  135 - 145 mmol/L Final   Potassium 08/20/2023 3.8  3.5 - 5.1 mmol/L Final   Chloride 08/20/2023 106  98 - 111 mmol/L Final   CO2 08/20/2023 22  22 - 32 mmol/L Final   Glucose, Bld 08/20/2023 83  70 - 99 mg/dL Final   Glucose reference range applies only to samples taken after fasting for at least 8 hours.   BUN 08/20/2023 12  6 - 20 mg/dL Final   Creatinine, Ser 08/20/2023 0.60  0.44 - 1.00 mg/dL Final   Calcium 09/30/2535 8.3 (L)  8.9 - 10.3 mg/dL Final   Total Protein 64/40/3474 7.4  6.5 - 8.1 g/dL Final   Albumin 25/95/6387 3.5  3.5 - 5.0 g/dL Final   AST 56/43/3295 23  15 - 41 U/L Final   ALT 08/20/2023 23  0 - 44 U/L Final   Alkaline Phosphatase 08/20/2023 130 (H)  38 - 126 U/L Final   Total Bilirubin 08/20/2023 0.3  0.3 - 1.2 mg/dL Final   GFR, Estimated 08/20/2023 >60  >60 mL/min Final   Comment: (NOTE) Calculated using the CKD-EPI Creatinine Equation (2021)    Anion gap 08/20/2023 10  5 - 15 Final   Performed at [facility], [address] [place], [place] [address]   Alcohol, Ethyl (B) 08/20/2023 214 (H)  <10 mg/dL Final   Comment: (NOTE) Lowest  detectable limit for serum alcohol is 10 mg/dL.  For medical purposes only. Performed at [facility], [address], [place], [place] [address]    Salicylate Lvl 08/20/2023 17.8  7.0 - 30.0 mg/dL Final   Performed at [facility], [address] [place], [place] [address]   Acetaminophen (Tylenol), Serum 08/20/2023 <10 (L)  10 - 30 ug/mL Final   Comment: (NOTE) Therapeutic concentrations vary significantly. A range of 10-30 ug/mL  may be an effective concentration for many patients. However, some  are best treated at concentrations outside of this range. Acetaminophen concentrations >150 ug/mL at 4 hours after ingestion  and >50 ug/mL at 12 hours after ingestion are often associated with  toxic reactions.  Performed at [facility], [address] [place], [place] [address]    WBC 08/20/2023 6.4  4.0 - 10.5 K/uL Final   RBC 08/20/2023 4.26  3.87 - 5.11 MIL/uL Final   Hemoglobin 08/20/2023 12.9  12.0 - 15.0 g/dL Final   HCT  08/20/2023 40.7  36.0 - 46.0 % Final   MCV 08/20/2023 95.5  80.0 - 100.0 fL Final   MCH 08/20/2023 30.3  26.0 - 34.0 pg Final   MCHC 08/20/2023 31.7  30.0 - 36.0 g/dL Final   RDW 16/09/9603 12.8  11.5 - 15.5 % Final   Platelets 08/20/2023 332  150 - 400 K/uL Final   nRBC 08/20/2023 0.0  0.0 - 0.2 % Final   Performed at [facility], [address] [place], [place] [address]   Opiates 08/20/2023 NONE DETECTED  NONE DETECTED Final   Cocaine 08/20/2023 NONE DETECTED  NONE DETECTED Final   Benzodiazepines 08/20/2023 NONE DETECTED  NONE DETECTED Final   Amphetamines 08/20/2023 NONE DETECTED  NONE DETECTED Final   Tetrahydrocannabinol 08/20/2023 NONE DETECTED  NONE DETECTED Final   Barbiturates 08/20/2023 NONE DETECTED  NONE DETECTED Final   Comment: (NOTE) DRUG SCREEN FOR MEDICAL PURPOSES ONLY.  IF CONFIRMATION IS NEEDED FOR ANY PURPOSE, NOTIFY LAB WITHIN 5 DAYS.  LOWEST  DETECTABLE LIMITS FOR URINE DRUG SCREEN Drug Class                     Cutoff (ng/mL) Amphetamine and metabolites    1000 Barbiturate and metabolites    200 Benzodiazepine                 200 Opiates and metabolites        300 Cocaine and metabolites        300 THC                            50 Performed at [facility], [address], [place], [place] [address]    SARS Coronavirus 2 by RT PCR 08/21/2023 NEGATIVE  NEGATIVE Final   Comment: (NOTE) SARS-CoV-2 target nucleic acids are NOT DETECTED.  The SARS-CoV-2 RNA is generally detectable in upper and lower respiratory specimens during the acute phase of infection. The lowest concentration of SARS-CoV-2 viral copies this assay can detect is 250 copies / mL. A negative result does not preclude SARS-CoV-2 infection and should not be used as the sole basis for treatment or other patient management decisions.  A negative result may occur with improper specimen collection / handling, submission of specimen other than nasopharyngeal swab, presence of viral mutation(s) within the areas targeted by this assay, and inadequate number of viral copies (<250 copies / mL). A negative result must be combined with clinical observations, patient history, and epidemiological information.  Fact Sheet for Patients:   RoadLapTop.co.za  Fact Sheet for Healthcare Providers: http://kim-miller.com/  This test is not yet approved or                           cleared by the [place] FDA and has been authorized for detection and/or diagnosis of SARS-CoV-2 by FDA under an Emergency Use Authorization (EUA).  This EUA will remain in effect (meaning this test can be used) for the duration of the COVID-19 declaration under Section 564(b)(1) of the Act, 21 U.S.C. section 360bbb-3(b)(1), unless the authorization is terminated or revoked sooner.  Performed at [facility],  [address] [place], [place] [address]     Blood Alcohol level:  Lab Results  Component Value Date   ETH 255 (H) 10/23/2023   ETH 214 (H) 08/20/2023    Metabolic Disorder Labs: Lab Results  Component Value Date   HGBA1C 5.2 10/23/2023   MPG 102.54 10/23/2023   MPG 102.54 12/25/2022   Lab Results  Component Value Date   PROLACTIN 9.7 10/23/2023   Lab Results  Component Value Date   CHOL 158 10/23/2023   TRIG 214 (H) 10/23/2023   HDL 105 10/23/2023   CHOLHDL 1.5 10/23/2023   VLDL 43 (H) 10/23/2023   LDLCALC 10 10/23/2023   LDLCALC 84 12/25/2022    Therapeutic Lab Levels: Lab Results  Component Value Date   LITHIUM <0.06 (L) 11/23/2016   No results found for: "VALPROATE" No results found for: "CBMZ"  Physical Findings   AIMS    Flowsheet Row [facility] Ad[facility] (Discharged) [facility] Admission (Discharged) [facility] Admission (Discharged) [facility]  AIMS Total Score 0 0 0 0      AUDIT    Flowsheet Row Admission (Discharged) from 08/21/2023 in [facility] Admission (Discharged) [facility] Ad[facility] (Discharged[facility] Admission (Discharged) [facility] Admission (Discharged[facility]  Alcohol Use Disorder Identification Test Final Score (AUDIT) 15 29 4 17 6       PHQ2-9    Flowsheet Row ED from 10/24/2023 in [facility]  PHQ-2 Total Score 4  PHQ-9 Total Score 11      Flowsheet Row ED from 10/24/2023 in [facility] ED from  10/23/2023 in [facility] Admission (Discharged) from 08/21/2023 in [facility] BE[facility]IORAL MEDICINE  C-SSRS RISK CATEGORY No Risk No Risk No Risk        Musculoskeletal  Strength & Muscle Tone: within normal limits Gait & Station: normal Patient leans: N/A  Psychiatric Specialty Exam  Presentation  General Appearance:  Appropriate for Environment  Eye Contact: Fair  Speech: Clear and Coherent; Normal Rate  Speech Volume: Normal  Handedness: Right   Mood and Affect  Mood: Hopeful, positive  Affect: Congruent   Thought Process  Thought Processes: Coherent; Goal Directed  Descriptions of Associations:Intact  Orientation:Full (Time, Place and Person)  Thought Content:Logical  Diagnosis of Schizophrenia or Schizoaffective disorder in past: No    Hallucinations: None Ideas of Reference:None  Suicidal Thoughts: None Homicidal Thoughts: None  Sensorium  Memory: Recent Fair; Immediate Fair  Judgment: Fair  Insight: Present; Fair   Art therapist  Concentration: Fair  Attention Span: Fair  Recall: [employer] of Knowledge: Good  Language: Good   Psychomotor Activity  Psychomotor Activity: Normal  Assets  Assets: Communication Skills; Desire for Improvement; Housing; Social Support; Leisure Time   Sleep  Sleep: Good  Physical Exam  Constitutional:      Appearance: the patient is not toxic-appearing.  Pulmonary:     Effort: Pulmonary effort is normal.  Neurological:     General: No focal deficit present.     Mental Status: the patient is alert and oriented to person, place, and time.   Review of Systems  Respiratory:  Negative for shortness of breath.   Cardiovascular:  Negative for chest pain.  Gastrointestinal:  Negative for abdominal pain, constipation, diarrhea, nausea and vomiting.  Neurological:  Negative for headaches.   Blood pressure 125/80,  pulse 82, temperature 98.3 F (36.8  C), temperature source Oral, resp. rate 20, SpO2 98%. There is no height or weight on file to calculate BMI.  Treatment Plan Summary: Daily contact with patient to assess and evaluate symptoms and progress in treatment, Medication management, and Plan    [name] is a 57 yo female with a past psychiatric history of alcohol use disorder, MDD, tobacco use disorder, multiple inpatient psychiatric hospitalizations (most recent [facility] 08/2023), multiple rehab stays (most recent [place] rehab last July-September) who presents to the [facility] requesting detox from alcohol.   Patient meets criteria for alcohol use disorder especially given past psychiatric history, history of rehab. Given withdrawal symptoms prior to arrival as well as receiving librium at [facility], will continue librium taper at [facility]. LFTs wnl. History of receiving naltrexone and acamprosate but reporting side effects from naltrexone, will restart acamprosate. Most recent CIWA 1, 0, 1 (anxiety). Will continue to monitor for sx of withdrawal. Also reporting tobacco use, will give NRT. Prior history of MDD but non-compliant with medications, will restart gabapentin for withdrawal and anxiety. Recommending residential for patient but patient is wanting to do CDIOP.   Labs notable for TG 214, Hgb 15.3. Repeat TSH wnl. UDS +BZD. Alcohol 255. Cr, AST/ALT wnl. Qtc 450 11/19.    #Alcohol use disorder -CIWA -continue librium taper (11/20-11/22) -PRN librium for CIWA >10  -PRN: atarax, tylenol, maalox, imodium, milk of mg, zofran -daily MV, thiamine  -continue acamprosate 666 TID for alcohol use d/o -continue gabapentin 100 TID for anxiety, alcohol withdrawal   #Substance-induced mood d/o -start prozac 20mg  daily for depression -d/c lamictal given hx non-compliance  -d/c PRN propanolol given asthma    #Tobacco use disorder  -nicotine patch    #Low TSH, resolved    #Asthma  -albuterol PRN -home anoro ellipta    Dispo: D/c home tomorrow,  CDIOP appointment set up for Monday 11/25 at 1pm  Karie Fetch, MD, PGY-2 10/25/2023 11:52 AM

## 2023-10-26 DIAGNOSIS — F1090 Alcohol use, unspecified, uncomplicated: Secondary | ICD-10-CM | POA: Diagnosis not present

## 2023-10-26 MED ORDER — TRAZODONE HCL 50 MG PO TABS: 50.0000 mg | ORAL_TABLET | Freq: Every evening | ORAL | 0 refills | Status: DC | PRN

## 2023-10-26 MED ORDER — NICOTINE 14 MG/24HR TD PT24: 14.0000 mg | MEDICATED_PATCH | Freq: Every day | TRANSDERMAL | 0 refills | Status: AC

## 2023-10-26 MED ORDER — GABAPENTIN 100 MG PO CAPS: 100.0000 mg | ORAL_CAPSULE | Freq: Three times a day (TID) | ORAL | 0 refills | Status: DC

## 2023-10-26 MED ORDER — FLUOXETINE HCL 20 MG PO CAPS: 20.0000 mg | ORAL_CAPSULE | Freq: Every day | ORAL | 0 refills | Status: DC

## 2023-10-26 MED ORDER — ACAMPROSATE CALCIUM 333 MG PO TBEC: 666.0000 mg | DELAYED_RELEASE_TABLET | Freq: Three times a day (TID) | ORAL | 0 refills | Status: AC

## 2023-10-26 NOTE — ED Notes (Signed)
Patient is awake and alert on unit sitting in day room talking with peer.  No distress or complaint.  No signs of withdrawal.  Will monitor

## 2023-10-26 NOTE — Group Note (Signed)
Group Topic: Change and Accountability  Group Date: 10/26/2023 Start Time: 1000 End Time: 1100 Facilitators: Donnie Coffin, NT  Department: [facility]  Number of Participants: 2  Group Focus: chemical dependency education, chemical dependency issues, and coping skills Treatment Modality:  Interpersonal Therapy Interventions utilized were group exercise Purpose: enhance coping skills  Name: [name] Date of Birth: Apr 23, 1966  MR: [id number]    Level of Participation: active Quality of Participation: cooperative and engaged Interactions with others: gave feedback Mood/Affect: appropriate and positive Triggers (if applicable): n/a Cognition: goal directed and insightful Progress: Gaining insight Response: talk more Plan: patient will be encouraged to attend group  Patients Problems:  Patient Active Problem List   Diagnosis Date Noted   Major depressive disorder, 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/22/2023   Alcohol withdrawal (HCC) 04/10/2023   Asthma exacerbation 04/09/2023   Alcohol use disorder 12/26/2022   Asthma 12/26/2022   MDD (major depressive disorder), recurrent episode, severe (HCC) 12/25/2022   Depression, unspecified 01/06/2021   Alcohol use 01/06/2021   Insomnia 06/18/2020   SUI (stress urinary incontinence, female)    Hypertension    History of anemia    GERD (gastroesophageal reflux disease)    History of alcoholism (HCC) 02/25/2020   Intractable migraine with aura without status migrainosus 02/25/2020   Attention deficit hyperactivity disorder (ADHD), predominantly inattentive type 01/07/2020   GAD (generalized anxiety disorder) 01/07/2020   MDD (major depressive disorder), recurrent severe, without psychosis (HCC) 01/07/2020   Diverticulitis with abscess/fistula s/p robotic left colectomy 06/16/2020 09/06/2019   MDD (major depressive disorder) 10/07/2015   Alcohol abuse with alcohol-induced mood  disorder (HCC) 10/06/2015

## 2023-10-26 NOTE — ED Provider Notes (Addendum)
FBC/OBS ASAP Discharge Summary  Date and Time: 10/26/2023 9:30 AM  Name: [name]  MRN:  [id number]   Discharge Diagnoses:  Final diagnoses:  Alcohol use disorder  Substance induced mood disorder (HCC)  Tobacco use disorder   Subjective: Patient is seen today in treatment team room. She reports she had some diarrhea yesterday (3-4x) but formed BM this morning. She otherwise denies symptoms of withdrawal. CIWA 0 at time of discharge. She reports she is excited to leave. She reports she will maintain her sobriety by going to CDIOP appointments, AA meetings, outpatient follow-up with psychiatrist. She reports she got a sponsor while she was here. She reports her friend will be staying with her and threw away her bottles. This was confirmed with friend Richardson Chiquito prior to discharge. She denies SI/HI/AVH.   Stay Summary: During the patient's hospitalization, patient had extensive initial psychiatric evaluation, and follow-up psychiatric evaluations every day.  Psychiatric diagnoses provided upon initial assessment:  -Alcohol use disorder -Substance-induced mood disorder -Tobacco use disorder  Patient's psychiatric medications were adjusted on admission:  -started on librium taper for alcohol withdrawal -started on acamprosate for alcohol use disorder -restarted on lower dose of home gabapentin for anxiety and alcohol withdrawal -d/c'ed home lamictal due to non-compliance with home dosage  -d/c'ed PRN propanolol given patient history of asthma   During the hospitalization, other adjustments were made to the patient's psychiatric medication regimen:  -started on prozac 20mg  for mood stability  -completed librium taper   Patient's care was discussed during the interdisciplinary team meeting every day during the hospitalization.  The patient denied having side effects to prescribed psychiatric medication.  Gradually, patient started adjusting to milieu. The patient was evaluated each  day by a clinical provider to ascertain response to treatment. Improvement was noted by the patient's report of decreasing symptoms, improved sleep and appetite, affect, medication tolerance, behavior, and participation in unit programming.  Patient was asked each day to complete a self inventory noting mood, mental status, pain, new symptoms, anxiety and concerns.    Symptoms were reported as significantly decreased or resolved completely by discharge.   On day of discharge, the patient reports that their mood is stable. The patient denied having suicidal thoughts for more than 48 hours prior to discharge.  Patient denies having homicidal thoughts.  Patient denies having auditory hallucinations.  Patient denies any visual hallucinations or other symptoms of psychosis. The patient was motivated to continue taking medication with a goal of continued improvement in mental health.   Supportive psychotherapy was provided to the patient. The patient also participated in regular group therapy while hospitalized. Coping skills, problem solving as well as relaxation therapies were also part of the unit programming.  Labs were reviewed with the patient, and abnormal results were discussed with the patient.  The patient is able to verbalize their individual safety plan to this provider.  # It is recommended to the patient to continue psychiatric medications as prescribed, after discharge from the hospital.    # It is recommended to the patient to follow up with your outpatient psychiatric provider and PCP.  # It was discussed with the patient, the impact of alcohol, drugs, tobacco have been there overall psychiatric and medical wellbeing, and total abstinence from substance use was recommended the patient.ed.  # Prescriptions provided or sent directly to preferred pharmacy at discharge. Patient agreeable to plan. Given opportunity to ask questions. Appears to feel comfortable with discharge.    # In the  event of worsening symptoms, the patient is instructed to call the crisis hotline, 911 and or go to the nearest ED for appropriate evaluation and treatment of symptoms. To follow-up with primary care provider for other medical issues, concerns and or health care needs  # Patient was discharged home with CDIOP appointment on Monday at 1pm with a plan to follow up as noted below.   Past Psychiatric History:  Past psychiatric history of MDD and GAD. She reports she was on prozac, lamictal, gabapentin and has also been on acamprosate in the past. She reports she was unable to get appointment with outpatient psychiatrist (Dr. Maggie Schwalbe) for the past month to refill medications. She reports she was on prozac 80, gabapentin 600 BID, lamictal 100 BID. Not on propranolol. Per chart review, patient has had at least 5 psychiatric hospitalizations (10/2015, 12/2015, 01/2021, 12/2022, 08/2023). Most recent at [facility] 08/2023 for suicidal ideations. She has had prior medication trials including prozac, buspar, gabapentin, proprnaolol, lexapro, viibyrd, zoloft, lamictal, campral, naltrexone (makes her nauseous), ambien, amphetamine. She has been non-compliant with medications.   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 She reports her support system is her daughter (lives in [place]), mom (lives across the street), and her friend. She is divorced   Tobacco Cessation:  A prescription for an FDA-approved tobacco cessation medication provided at discharge  Current Medications:  Current Facility-Administered Medications  Medication Dose Route Frequency Provider Last Rate Last Admin   acamprosate (CAMPRAL) tablet 666 mg  666 mg Oral TID Karie Fetch, MD   666 mg at 10/26/23 0916   acetaminophen (TYLENOL) tablet 650 mg  650 mg Oral Q6H PRN Rankin, Shuvon B, NP   650 mg at 10/26/23 0659   albuterol (VENTOLIN HFA) 108 (90 Base)  MCG/ACT inhaler 1-2 puff  1-2 puff Inhalation Q6H PRN Karie Fetch, MD   2 puff at 10/25/23 1236   alum & mag hydroxide-simeth (MAALOX/MYLANTA) 200-200-20 MG/5ML suspension 30 mL  30 mL Oral Q4H PRN Rankin, Shuvon B, NP       chlordiazePOXIDE (LIBRIUM) capsule 25 mg  25 mg Oral Q6H PRN Rankin, Shuvon B, NP       FLUoxetine (PROZAC) capsule 20 mg  20 mg Oral Daily Karie Fetch, MD   20 mg at 10/26/23 0916   gabapentin (NEURONTIN) capsule 100 mg  100 mg Oral TID Karie Fetch, MD   100 mg at 10/26/23 1517   hydrOXYzine (ATARAX) tablet 25 mg  25 mg Oral Q6H PRN Rankin, Shuvon B, NP       loperamide (IMODIUM) capsule 2-4 mg  2-4 mg Oral PRN Rankin, Shuvon B, NP   2 mg at 10/25/23 1833   multivitamin with minerals tablet 1 tablet  1 tablet Oral Daily Rankin, Shuvon B, NP   1 tablet at 10/26/23 0917   nicotine (NICODERM CQ - dosed in mg/24 hours) patch 14 mg  14 mg Transdermal Daily Karie Fetch, MD   14 mg at 10/26/23 0916   ondansetron (ZOFRAN-ODT) disintegrating tablet 4 mg  4 mg Oral Q6H PRN Rankin, Shuvon B, NP       traZODone (DESYREL) tablet 50 mg  50 mg Oral QHS PRN Karie Fetch, MD   50 mg at 10/25/23 2120   umeclidinium-vilanterol (ANORO ELLIPTA) 62.5-25 MCG/ACT 1 puff  1 puff Inhalation Daily Sindy Guadeloupe, NP   1 puff at 10/25/23 [phone]  Current Outpatient Medications  Medication Sig Dispense Refill   acetaminophen (TYLENOL) 500 MG tablet Take 1,000 mg by mouth every 6 (six) hours as needed (For pain).     albuterol (ACCUNEB) 1.25 MG/3ML nebulizer solution Take 1 ampule by nebulization 3 (three) times daily as needed for wheezing or shortness of breath.     albuterol (VENTOLIN HFA) 108 (90 Base) MCG/ACT inhaler Inhale 1-2 puffs into the lungs every 6 (six) hours as needed for wheezing.     ANORO ELLIPTA 62.5-25 MCG/ACT AEPB Inhale 1 puff into the lungs daily.     Aspirin-Salicylamide-Caffeine (BC HEADACHE POWDER PO) Take 1 packet by mouth as needed (for headaches or pain).      busPIRone (BUSPAR) 15 MG tablet Take 7.5 mg by mouth 2 (two) times daily.     cloNIDine (CATAPRES) 0.1 MG tablet Take 0.1 mg by mouth 2 (two) times daily as needed (For anxiety).     cyclobenzaprine (FLEXERIL) 5 MG tablet Take 1 tablet (5 mg total) by mouth 3 (three) times daily as needed for muscle spasms. 30 tablet 0   DUPIXENT 300 MG/2ML SOAJ Inject 300 mg into the skin every 14 (fourteen) days.     FLUoxetine (PROZAC) 40 MG capsule Take 40 mg by mouth daily.     fluticasone (FLONASE) 50 MCG/ACT nasal spray Place 2 sprays into both nostrils daily.     folic acid (FOLVITE) 1 MG tablet Take 1 mg by mouth daily.     gabapentin (NEURONTIN) 600 MG tablet Take 600 mg by mouth 2 (two) times daily.     hydrOXYzine (ATARAX) 25 MG tablet Take 1 tablet (25 mg total) by mouth every 6 (six) hours as needed (anxiety/agitation or CIWA < or = 10). 30 tablet 0   ibuprofen (ADVIL) 200 MG tablet Take 800 mg by mouth every 6 (six) hours as needed (For pain).     lamoTRIgine (LAMICTAL) 100 MG tablet Take 1 tablet (100 mg total) by mouth 2 (two) times daily. 60 tablet 0   montelukast (SINGULAIR) 10 MG tablet Take 10 mg by mouth daily.     nicotine (NICODERM CQ - DOSED IN MG/24 HOURS) 14 mg/24hr patch Place 1 patch (14 mg total) onto the skin daily. 28 patch 0   progesterone (PROMETRIUM) 100 MG capsule Take 200 mg by mouth at bedtime.     SUMAtriptan (IMITREX) 100 MG tablet Take 1 tablet (100 mg total) by mouth every 2 (two) hours as needed for migraine. May repeat in 2 hours if headache persists or recurs. Do not exceed 2 tablets within a 24 hour period. (Patient taking differently: Take 100 mg by mouth every 2 (two) hours as needed for migraine (May repeat in 2 hours if headache persists or recurs. Do not exceed 2 tablets within a 24 hour period.).) 10 tablet 0   thiamine (VITAMIN B-1) 100 MG tablet Take 1 tablet (100 mg total) by mouth daily. 30 tablet 0   traZODone (DESYREL) 50 MG tablet Take 1 tablet (50 mg  total) by mouth at bedtime. 30 tablet 0   triamcinolone cream (KENALOG) 0.5 % Apply topically 2 (two) times daily. (Patient taking differently: Apply 1 Application topically 2 (two) times daily as needed (Apply to affected area).) 30 g 0   Turmeric (QC TUMERIC COMPLEX PO) Take 2 capsules by mouth daily.      PTA Medications:  Facility Ordered Medications  Medication   acetaminophen (TYLENOL) tablet 650 mg   alum & mag hydroxide-simeth (MAALOX/MYLANTA) 200-200-20  MG/5ML suspension 30 mL   multivitamin with minerals tablet 1 tablet   chlordiazePOXIDE (LIBRIUM) capsule 25 mg   hydrOXYzine (ATARAX) tablet 25 mg   loperamide (IMODIUM) capsule 2-4 mg   ondansetron (ZOFRAN-ODT) disintegrating tablet 4 mg   umeclidinium-vilanterol (ANORO ELLIPTA) 62.5-25 MCG/ACT 1 puff   gabapentin (NEURONTIN) capsule 100 mg   nicotine (NICODERM CQ - dosed in mg/24 hours) patch 14 mg   [COMPLETED] chlordiazePOXIDE (LIBRIUM) capsule 25 mg   Followed by   [COMPLETED] chlordiazePOXIDE (LIBRIUM) capsule 25 mg   Followed by   [COMPLETED] chlordiazePOXIDE (LIBRIUM) capsule 25 mg   acamprosate (CAMPRAL) tablet 666 mg   albuterol (VENTOLIN HFA) 108 (90 Base) MCG/ACT inhaler 1-2 puff   traZODone (DESYREL) tablet 50 mg   FLUoxetine (PROZAC) capsule 20 mg   PTA Medications  Medication Sig   thiamine (VITAMIN B-1) 100 MG tablet Take 1 tablet (100 mg total) by mouth daily.   albuterol (ACCUNEB) 1.25 MG/3ML nebulizer solution Take 1 ampule by nebulization 3 (three) times daily as needed for wheezing or shortness of breath.   Turmeric (QC TUMERIC COMPLEX PO) Take 2 capsules by mouth daily.   SUMAtriptan (IMITREX) 100 MG tablet Take 1 tablet (100 mg total) by mouth every 2 (two) hours as needed for migraine. May repeat in 2 hours if headache persists or recurs. Do not exceed 2 tablets within a 24 hour period. (Patient taking differently: Take 100 mg by mouth every 2 (two) hours as needed for migraine (May repeat in 2 hours  if headache persists or recurs. Do not exceed 2 tablets within a 24 hour period.).)   fluticasone (FLONASE) 50 MCG/ACT nasal spray Place 2 sprays into both nostrils daily.   Aspirin-Salicylamide-Caffeine (BC HEADACHE POWDER PO) Take 1 packet by mouth as needed (for headaches or pain).   ANORO ELLIPTA 62.5-25 MCG/ACT AEPB Inhale 1 puff into the lungs daily.   hydrOXYzine (ATARAX) 25 MG tablet Take 1 tablet (25 mg total) by mouth every 6 (six) hours as needed (anxiety/agitation or CIWA < or = 10).   nicotine (NICODERM CQ - DOSED IN MG/24 HOURS) 14 mg/24hr patch Place 1 patch (14 mg total) onto the skin daily.   cyclobenzaprine (FLEXERIL) 5 MG tablet Take 1 tablet (5 mg total) by mouth 3 (three) times daily as needed for muscle spasms.   lamoTRIgine (LAMICTAL) 100 MG tablet Take 1 tablet (100 mg total) by mouth 2 (two) times daily.   triamcinolone cream (KENALOG) 0.5 % Apply topically 2 (two) times daily. (Patient taking differently: Apply 1 Application topically 2 (two) times daily as needed (Apply to affected area).)   traZODone (DESYREL) 50 MG tablet Take 1 tablet (50 mg total) by mouth at bedtime.   busPIRone (BUSPAR) 15 MG tablet Take 7.5 mg by mouth 2 (two) times daily.   montelukast (SINGULAIR) 10 MG tablet Take 10 mg by mouth daily.   progesterone (PROMETRIUM) 100 MG capsule Take 200 mg by mouth at bedtime.   albuterol (VENTOLIN HFA) 108 (90 Base) MCG/ACT inhaler Inhale 1-2 puffs into the lungs every 6 (six) hours as needed for wheezing.       10/24/2023   10:40 AM  Depression screen PHQ 2/9  Decreased Interest 1  Down, Depressed, Hopeless 3  PHQ - 2 Score 4  Altered sleeping 2  Tired, decreased energy 1  Change in appetite 1  Feeling bad or failure about yourself  2  Trouble concentrating 1  Moving slowly or fidgety/restless 0  Suicidal thoughts 0  PHQ-9 Score 11  Difficult doing work/chores Somewhat difficult    Flowsheet Row ED from 10/24/2023 in [facility] ED from 10/23/2023 in [facility] Admission (Discharged) from 08/21/2023 in [facility]  C-SSRS RISK CATEGORY No Risk No Risk No Risk       Musculoskeletal  Strength & Muscle Tone: within normal limits Gait & Station: normal Patient leans: N/A  Psychiatric Specialty Exam  Presentation  General Appearance:  Appropriate for Environment  Eye Contact: Fair  Speech: Clear and Coherent; Normal Rate  Speech Volume: Normal  Handedness: Right   Mood and Affect  Mood: Excited to leave  Affect: Congruent   Thought Process  Thought Processes: Coherent; Goal Directed  Descriptions of Associations:Intact  Orientation:Full (Time, Place and Person)  Thought Content:Logical  Diagnosis of Schizophrenia or Schizoaffective disorder in past: No    Hallucinations: None Ideas of Reference:None  Suicidal Thoughts: None Homicidal Thoughts: None  Sensorium  Memory: Recent Fair; Immediate Fair  Judgment: Fair  Insight: Present; Fair   Art therapist  Concentration: Fair  Attention Span: Fair  Recall: [employer] of Knowledge: Good  Language: Good   Psychomotor Activity  Psychomotor Activity: Normal  Assets  Assets: Communication Skills; Desire for Improvement; Housing; Social Support; Leisure Time   Sleep  Sleep: Fair   Physical Exam  Constitutional:      Appearance: the patient is not toxic-appearing.  Pulmonary:     Effort: Pulmonary effort is normal.  Neurological:     General: No focal deficit present.     Mental Status: the patient is alert and oriented to person, place, and time.   Review of Systems  Respiratory:  Negative for shortness of breath.   Cardiovascular:  Negative for chest pain.  Gastrointestinal:  Negative for abdominal pain, constipation, diarrhea, nausea and vomiting.  Neurological:  Negative for headaches.   Blood pressure 120/64, pulse 89,  temperature 98.2 F (36.8 C), temperature source Oral, resp. rate 17, SpO2 98%. There is no height or weight on file to calculate BMI.  Demographic Factors:  Divorced or widowed and Low socioeconomic status  Loss Factors: Decrease in vocational status and Financial problems/change in socioeconomic status  Historical Factors: Prior suicide attempts and Impulsivity  Risk Reduction Factors:   Employed, Living with another person, especially a relative, and Positive social support  Continued Clinical Symptoms:  Depression Alcohol/Substance Abuse/Dependencies Previous Psychiatric Diagnoses and Treatments  Cognitive Features That Contribute To Risk:  Thought constriction (tunnel vision)    Suicide Risk:  Mild: There are no identifiable plans, no associated intent, mild dysphoria and related symptoms, good self-control (both objective and subjective assessment), few other risk factors, and identifiable protective factors, including available and accessible social support.  Plan Of Care/Follow-up recommendations:  Activity: as tolerated  Diet: heart healthy  Other: -Follow-up with your outpatient psychiatric provider -instructions on appointment date, time, and address (location) are provided to you in discharge paperwork.  -Take your psychiatric medications as prescribed at discharge - instructions are provided to you in the discharge paperwork  -Follow-up with outpatient primary care doctor and other specialists -for management of preventative medicine and chronic medical disease -asthma  -If you are prescribed an atypical antipsychotic medication, we recommend that your outpatient psychiatrist follow routine screening for side effects within 3 months of discharge, including monitoring: AIMS scale, height, weight, blood pressure, fasting lipid panel, HbA1c, and fasting blood sugar.   -Recommend total abstinence from alcohol, tobacco, and  other illicit drug use at discharge.   -If  your psychiatric symptoms recur, worsen, or if you have side effects to your psychiatric medications, call your outpatient psychiatric provider, 911, [phone] or go to the nearest emergency department.  -If suicidal thoughts occur, immediately call your outpatient psychiatric provider, 911, [phone] or go to the nearest emergency department.   Disposition: Home, CDIOP appointment set up on Monday 11/25 at 1pm Outpatient psychiatrist appointment on 12/4 at [facility], MD, PGY-2 10/26/2023, 9:30 AM

## 2023-10-26 NOTE — ED Notes (Signed)
Patient is sleeping. Respirations equal and unlabored, skin warm and dry. No change in assessment or acuity. Routine safety checks conducted according to facility protocol. Will continue to monitor for safety.   

## 2023-10-29 ENCOUNTER — Encounter (HOSPITAL_COMMUNITY): Payer: Self-pay

## 2023-10-29 ENCOUNTER — Ambulatory Visit (INDEPENDENT_AMBULATORY_CARE_PROVIDER_SITE_OTHER): Payer: 59

## 2023-10-29 VITALS — BP 113/65 | HR 100 | Temp 98.3°F | Ht 64.0 in | Wt 158.0 lb

## 2023-10-29 DIAGNOSIS — F1020 Alcohol dependence, uncomplicated: Secondary | ICD-10-CM

## 2023-10-29 DIAGNOSIS — F331 Major depressive disorder, recurrent, moderate: Secondary | ICD-10-CM | POA: Diagnosis not present

## 2023-10-29 DIAGNOSIS — F411 Generalized anxiety disorder: Secondary | ICD-10-CM | POA: Diagnosis not present
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
## 2023-10-30 ENCOUNTER — Encounter (HOSPITAL_COMMUNITY): Payer: Self-pay

## 2023-10-31 ENCOUNTER — Encounter (HOSPITAL_COMMUNITY): Payer: Self-pay

## 2023-10-31 ENCOUNTER — Ambulatory Visit (HOSPITAL_COMMUNITY): Payer: 59

## 2023-10-31 ENCOUNTER — Telehealth (HOSPITAL_COMMUNITY): Payer: Self-pay | Admitting: Licensed Clinical Social Worker

## 2023-10-31 NOTE — Telephone Encounter (Signed)
As Girtrude no shows for IOP today, the therapist leaves a HIPAA-compliant voicemail asking that she call back to verify if she will be attending group on Monday.   Myrna Blazer, MA, LCSW, [facility], LCAS 10/31/2023

## 2023-11-05 ENCOUNTER — Ambulatory Visit (HOSPITAL_COMMUNITY): Payer: 59

## 2023-11-07 ENCOUNTER — Ambulatory Visit (HOSPITAL_COMMUNITY): Payer: 59

## 2023-11-07 ENCOUNTER — Ambulatory Visit (HOSPITAL_COMMUNITY): Payer: 59 | Admitting: Psychiatry

## 2023-11-09 ENCOUNTER — Ambulatory Visit (HOSPITAL_COMMUNITY): Payer: 59

## 2023-11-12 ENCOUNTER — Ambulatory Visit (HOSPITAL_COMMUNITY): Payer: 59

## 2023-11-14 ENCOUNTER — Ambulatory Visit (HOSPITAL_COMMUNITY): Payer: 59

## 2023-11-16 ENCOUNTER — Ambulatory Visit (HOSPITAL_COMMUNITY): Payer: 59

## 2023-11-18 ENCOUNTER — Other Ambulatory Visit: Payer: Self-pay

## 2023-11-18 ENCOUNTER — Encounter (HOSPITAL_COMMUNITY): Payer: Self-pay

## 2023-11-18 ENCOUNTER — Emergency Department (HOSPITAL_COMMUNITY)
Admission: EM | Admit: 2023-11-18 | Discharge: 2023-11-18 | Disposition: A | Discharge status: Home / Self Care | Payer: 59 | Attending: Emergency Medicine | Admitting: Emergency Medicine

## 2023-11-18 ENCOUNTER — Emergency Department (HOSPITAL_COMMUNITY): Payer: 59

## 2023-11-18 DIAGNOSIS — R062 Wheezing: Secondary | ICD-10-CM | POA: Diagnosis present

## 2023-11-18 DIAGNOSIS — R000 Tachycardia, unspecified: Secondary | ICD-10-CM | POA: Insufficient documentation

## 2023-11-18 DIAGNOSIS — J45901 Unspecified asthma with (acute) exacerbation: Secondary | ICD-10-CM | POA: Diagnosis not present

## 2023-11-18 DIAGNOSIS — F1010 Alcohol abuse, uncomplicated: Secondary | ICD-10-CM | POA: Diagnosis not present

## 2023-11-18 DIAGNOSIS — U071 COVID-19: Secondary | ICD-10-CM | POA: Diagnosis not present

## 2023-11-18 DIAGNOSIS — Y906 Blood alcohol level of 120-199 mg/100 ml: Secondary | ICD-10-CM | POA: Insufficient documentation

## 2023-11-18 DIAGNOSIS — E876 Hypokalemia: Secondary | ICD-10-CM | POA: Insufficient documentation

## 2023-11-18 LAB — RESP PANEL BY RT-PCR (RSV, FLU A&B, COVID)  RVPGX2
Influenza A by PCR: NEGATIVE
Influenza B by PCR: NEGATIVE
Resp Syncytial Virus by PCR: NEGATIVE
SARS Coronavirus 2 by RT PCR: POSITIVE — AB

## 2023-11-18 LAB — COMPREHENSIVE METABOLIC PANEL
ALT: 22 U/L (ref 0–44)
AST: 26 U/L (ref 15–41)
Albumin: 3.5 g/dL (ref 3.5–5.0)
Alkaline Phosphatase: 111 U/L (ref 38–126)
Anion gap: 9 (ref 5–15)
BUN: 9 mg/dL (ref 6–20)
CO2: 23 mmol/L (ref 22–32)
Calcium: 8.1 mg/dL — ABNORMAL LOW (ref 8.9–10.3)
Chloride: 106 mmol/L (ref 98–111)
Creatinine, Ser: 0.63 mg/dL (ref 0.44–1.00)
GFR, Estimated: >60 mL/min (ref >60)
Glucose, Bld: 134 mg/dL — ABNORMAL HIGH (ref 70–99)
Potassium: 2.7 mmol/L — CL (ref 3.5–5.1)
Sodium: 138 mmol/L (ref 135–145)
Total Bilirubin: 0.6 mg/dL (ref <1.2)
Total Protein: 6.2 g/dL — ABNORMAL LOW (ref 6.5–8.1)

## 2023-11-18 LAB — CBC WITH DIFFERENTIAL/PLATELET
Abs Immature Granulocytes: 0.01 10*3/uL (ref 0.00–0.07)
Basophils Absolute: 0 10*3/uL (ref 0.0–0.1)
Basophils Relative: 1 %
Eosinophils Absolute: 0.1 10*3/uL (ref 0.0–0.5)
Eosinophils Relative: 1 %
HCT: 42.7 % (ref 36.0–46.0)
Hemoglobin: 14.3 g/dL (ref 12.0–15.0)
Immature Granulocytes: 0 %
Lymphocytes Relative: 38 %
Lymphs Abs: 2 10*3/uL (ref 0.7–4.0)
MCH: 32.4 pg (ref 26.0–34.0)
MCHC: 33.5 g/dL (ref 30.0–36.0)
MCV: 96.6 fL (ref 80.0–100.0)
Monocytes Absolute: 0.3 10*3/uL (ref 0.1–1.0)
Monocytes Relative: 5 %
Neutro Abs: 2.9 10*3/uL (ref 1.7–7.7)
Neutrophils Relative %: 55 %
Platelets: 227 10*3/uL (ref 150–400)
RBC: 4.42 MIL/uL (ref 3.87–5.11)
RDW: 13.8 % (ref 11.5–15.5)
WBC: 5.3 10*3/uL (ref 4.0–10.5)
nRBC: 0 % (ref 0.0–0.2)

## 2023-11-18 LAB — ETHANOL: Alcohol, Ethyl (B): 186 mg/dL — ABNORMAL HIGH (ref <10)

## 2023-11-18 MED ORDER — POTASSIUM CHLORIDE 10 MEQ/100ML IV SOLN
10.0000 meq | Freq: Once | INTRAVENOUS | Status: AC
  Administered 2023-11-18: 10 meq via INTRAVENOUS
  Filled 2023-11-18: qty 100

## 2023-11-18 MED ORDER — POTASSIUM CHLORIDE CRYS ER 20 MEQ PO TBCR
40.0000 meq | EXTENDED_RELEASE_TABLET | Freq: Once | ORAL | Status: AC
  Administered 2023-11-18: 30 meq via ORAL
  Filled 2023-11-18: qty 4

## 2023-11-18 MED ORDER — ACETAMINOPHEN 500 MG PO TABS
1000.0000 mg | ORAL_TABLET | Freq: Once | ORAL | Status: AC
  Administered 2023-11-18: 1000 mg via ORAL
  Filled 2023-11-18: qty 2

## 2023-11-18 MED ORDER — IBUPROFEN 200 MG PO TABS
600.0000 mg | ORAL_TABLET | Freq: Once | ORAL | Status: AC
  Administered 2023-11-18: 600 mg via ORAL
  Filled 2023-11-18: qty 3

## 2023-11-18 MED ORDER — PREDNISONE 10 MG PO TABS: 40.0000 mg | ORAL_TABLET | Freq: Every day | ORAL | 0 refills | Status: AC

## 2023-11-18 MED ORDER — IPRATROPIUM-ALBUTEROL 0.5-2.5 (3) MG/3ML IN SOLN
3.0000 mL | Freq: Once | RESPIRATORY_TRACT | Status: AC
  Administered 2023-11-18: 3 mL via RESPIRATORY_TRACT
  Filled 2023-11-18: qty 3

## 2023-11-18 MED ORDER — LORAZEPAM 1 MG PO TABS
1.0000 mg | ORAL_TABLET | Freq: Once | ORAL | Status: AC
  Administered 2023-11-18: 1 mg via ORAL
  Filled 2023-11-18: qty 1

## 2023-11-18 NOTE — ED Triage Notes (Signed)
Pt BIB [facility] from home. Pt presents with ShOB, fatigue x 5 days. 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 EMS report pt with decreased air movement on lung sounds. The started pt on 15L O2 via NRB. The admin 2 duo nebs and 125 mg of solumedrol. Pt on R/A at arrival.   EMS Vitals   146/79 HR 127 SpO2 98% on R/A.

## 2023-11-18 NOTE — Discharge Instructions (Addendum)
Return for any problem.  ?

## 2023-11-18 NOTE — ED Provider Notes (Signed)
[place] EMERGENCY DEPARTMENT AT [facility] Provider Note   CSN: [id number] Arrival date & time: 11/18/23  1444     History  No chief complaint on file.   [name] is a 57 y.o. female.  57 year old female with prior medical history detailed below presents for evaluation.  Patient with  complaint of wheezing and increased work of breathing.  Patient reports asthma exacerbation.  EMS administered 2 breathing treatments and 125 mg of Solu-Medrol prior to arrival.  Patient reports improvement.  She denies chest pain.  She denies fever.  Symptoms began about 5 days ago.  The history is provided by the patient and medical records.       Home Medications Prior to Admission medications   Medication Sig Start Date End Date Taking? Authorizing Provider  acamprosate (CAMPRAL) 333 MG tablet Take 2 tablets (666 mg total) by mouth 3 (three) times daily. 10/26/23 11/25/23  Karie Fetch, MD  acetaminophen (TYLENOL) 500 MG tablet Take 1,000 mg by mouth every 6 (six) hours as needed (For pain).    [provider]  albuterol (ACCUNEB) 1.25 MG/3ML nebulizer solution Take 1 ampule by nebulization 3 (three) times daily as needed for wheezing or shortness of breath.    [provider]  albuterol (VENTOLIN HFA) 108 (90 Base) MCG/ACT inhaler Inhale 1-2 puffs into the lungs every 6 (six) hours as needed for wheezing. 10/14/23   [provider]  Ernestina Patches 62.5-25 MCG/ACT AEPB Inhale 1 puff into the lungs daily.    [provider]  cloNIDine (CATAPRES) 0.1 MG tablet Take 0.1 mg by mouth 2 (two) times daily as needed (For anxiety).    [provider]  DUPIXENT 300 MG/2ML SOAJ Inject 300 mg into the skin every 14 (fourteen) days.    [provider]  FLUoxetine (PROZAC) 20 MG capsule Take 1 capsule (20 mg total) by mouth daily. 10/26/23 11/25/23  Karie Fetch, MD  fluticasone [employer]) 50 MCG/ACT nasal spray Place 2 sprays  into both nostrils daily.    [provider]  folic acid (FOLVITE) 1 MG tablet Take 1 mg by mouth daily.    [provider]  gabapentin (NEURONTIN) 100 MG capsule Take 1 capsule (100 mg total) by mouth 3 (three) times daily. 10/26/23 11/25/23  Karie Fetch, MD  montelukast (SINGULAIR) 10 MG tablet Take 10 mg by mouth daily. 10/19/23   [provider]  nicotine (NICODERM CQ - DOSED IN MG/24 HOURS) 14 mg/24hr patch Place 1 patch (14 mg total) onto the skin daily. 10/26/23 11/25/23  Karie Fetch, MD  progesterone (PROMETRIUM) 100 MG capsule Take 200 mg by mouth at bedtime. 10/19/23   [provider]  SUMAtriptan (IMITREX) 100 MG tablet Take 1 tablet (100 mg total) by mouth every 2 (two) hours as needed for migraine. May repeat in 2 hours if headache persists or recurs. Do not exceed 2 tablets within a 24 hour period. Patient taking differently: Take 100 mg by mouth every 2 (two) hours as needed for migraine (May repeat in 2 hours if headache persists or recurs. Do not exceed 2 tablets within a 24 hour period.). 04/12/23   Narda Bonds, MD  traZODone (DESYREL) 50 MG tablet Take 1 tablet (50 mg total) by mouth at bedtime as needed for sleep. 10/26/23 11/25/23  Karie Fetch, MD  triamcinolone cream (KENALOG) 0.5 % Apply topically 2 (two) times daily. Patient taking differently: Apply 1 Application topically 2 (two) times daily as needed (Apply to  affected area). 08/25/23   Lewanda Rife, MD  Turmeric (QC TUMERIC COMPLEX PO) Take 2 capsules by mouth daily.    [provider]      Allergies    Codeine and Hydrocodone    Review of Systems   Review of Systems  All other systems reviewed and are negative.   Physical Exam Updated Vital Signs BP 138/83 (BP Location: Right Arm)   Pulse (!) 121   Temp 98.9 F (37.2 C) (Oral)   Resp 19   Ht 5\' 5"  (1.651 m)   Wt 68 kg   SpO2 99%   BMI 24.96 kg/m  Physical Exam Vitals and nursing note  reviewed.  Constitutional:      General: She is not in acute distress.    Appearance: Normal appearance. She is well-developed.  HENT:     Head: Normocephalic and atraumatic.  Eyes:     Conjunctiva/sclera: Conjunctivae normal.     Pupils: Pupils are equal, round, and reactive to light.  Cardiovascular:     Rate and Rhythm: Regular rhythm. Tachycardia present.     Heart sounds: Normal heart sounds.  Pulmonary:     Effort: Pulmonary effort is normal. No respiratory distress.     Comments: Trace bilateral expiratory wheezes bilaterally. Abdominal:     General: There is no distension.     Palpations: Abdomen is soft.     Tenderness: There is no abdominal tenderness.  Musculoskeletal:        General: No deformity. Normal range of motion.     Cervical back: Normal range of motion and neck supple.  Skin:    General: Skin is warm and dry.  Neurological:     General: No focal deficit present.     Mental Status: She is alert and oriented to person, place, and time.     ED Results / Procedures / Treatments   Labs (all labs ordered are listed, but only abnormal results are displayed) Labs Reviewed  RESP PANEL BY RT-PCR (RSV, FLU A&B, COVID)  [id number]  CBC WITH DIFFERENTIAL/PLATELET  COMPREHENSIVE METABOLIC PANEL  ETHANOL    EKG EKG Interpretation Date/Time:  Sunday November 18 2023 15:00:06 EST Ventricular Rate:  123 PR Interval:  139 QRS Duration:  87 QT Interval:  317 QTC Calculation: 454 R Axis:   71  Text Interpretation: Sinus tachycardia Ventricular premature complex Aberrant complex Consider right atrial enlargement Confirmed by Kristine Royal [phone]) on 11/18/2023 3:08:02 PM  Radiology No results found.  Procedures Procedures    Medications Ordered in ED Medications  ipratropium-albuterol (DUONEB) 0.5-2.5 (3) MG/3ML nebulizer solution 3 mL (has no administration in time range)    ED Course/ Medical Decision Making/ A&P                                  Medical Decision Making Amount and/or Complexity of Data Reviewed Labs: ordered. Radiology: ordered.  Risk OTC drugs. Prescription drug management.    Medical Screen Complete  This patient presented to the ED with complaint of sob/wheezing.  This complaint involves an extensive number of treatment options. The initial differential diagnosis includes, but is not limited to, asthma exacerbation, viral infection, etc  This presentation is: Acute, Chronic, Self-Limited, Previously Undiagnosed, Uncertain Prognosis, Complicated, Systemic Symptoms, and Threat to Life/Bodily Function  Patient viral symptoms and associated asthma exacerbation.   Improved with treatment. She desires DC home.   Strict return precautions given and  understood. Close FU stressed.   Additional history obtained: External records from outside sources obtained and reviewed including prior ED visits and prior Inpatient records.    Lab Tests:  I ordered and personally interpreted labs.  The pertinent results include:  CBC CMP ETOH COVID FLU   Imaging Studies ordered:  I ordered imaging studies including cxr  I independently visualized and interpreted obtained imaging which showed nad I agree with the radiologist interpretation.   Cardiac Monitoring:  The patient was maintained on a cardiac monitor.  I personally viewed and interpreted the cardiac monitor which showed an underlying rhythm of: sinus tach   Medicines ordered:  I ordered medication including potassium ativan  for hypokalemia, anxiety  Reevaluation of the patient after these medicines showed that the patient: improved    Problem List / ED Course:  Asthma exacerbation, Covid infection, alcohol abuse, hypokalemia   Reevaluation:  After the interventions noted above, I reevaluated the patient and found that they have: improved   Disposition:  After consideration of the diagnostic results and the patients response to treatment,  I feel that the patent would benefit from close outpatient followup.          Final Clinical Impression(s) / ED Diagnoses Final diagnoses:  Mild asthma with exacerbation, unspecified whether persistent  COVID    Rx / DC Orders ED Discharge Orders     None         Wynetta Fines, MD 11/18/23 [phone]

## 2023-11-19 ENCOUNTER — Ambulatory Visit (HOSPITAL_COMMUNITY): Payer: 59

## 2023-11-19 ENCOUNTER — Emergency Department (HOSPITAL_COMMUNITY)
Admission: EM | Admit: 2023-11-19 | Discharge: 2023-11-19 | Discharge status: Against Medical Advice | Payer: 59 | Attending: Emergency Medicine | Admitting: Emergency Medicine

## 2023-11-19 DIAGNOSIS — R0602 Shortness of breath: Secondary | ICD-10-CM | POA: Diagnosis present

## 2023-11-19 DIAGNOSIS — Z5321 Procedure and treatment not carried out due to patient leaving prior to being seen by health care provider: Secondary | ICD-10-CM | POA: Insufficient documentation

## 2023-11-19 NOTE — ED Notes (Signed)
Pt informed staff that she was calling a ride to come pick her up. She stated she did not want to stay to be seen.

## 2023-11-19 NOTE — ED Triage Notes (Signed)
PT arrives via EMS from home. Pt was seen in the ed yesterday. She was diagnosed with covid. Pt reports ongoing sob and feels anxious. Pt is AxOx4. NAD.   Pt reports she drank 3 bottles of wine today.

## 2023-11-19 NOTE — ED Provider Triage Note (Signed)
Emergency Medicine Provider Triage Evaluation Note  [name] , a 58 y.o. female  was evaluated in triage.  Pt complains of covid, shortness of breath. Seen for same yesterday and was diagnosed with covid. Discharged with steroids and albuterol with atrovent. Has been taking with no relief. Called EMS for same and they gave her a nebulizer treatment with significant improvement. Denies any residual shortness of breath. No chest pain, fevers, chills, nausea, or vomiting.  Review of Systems  Positive:  Negative:   Physical Exam  BP 109/70   Pulse (!) 125   Temp 98.8 F (37.1 C)   Resp 17   SpO2 96%  Gen:   Awake, no distress   Resp:  Normal effort speaking in complete sentences MSK:   Moves extremities without difficulty  Other:    Medical Decision Making  Medically screening exam initiated at 6:10 PM.  Appropriate orders placed.  [name] was informed that the remainder of the evaluation will be completed by another provider, this initial triage assessment does not replace that evaluation, and the importance of remaining in the ED until their evaluation is complete.     Silva Bandy, PA-C 11/19/23 [phone]

## 2023-11-21 ENCOUNTER — Ambulatory Visit (HOSPITAL_COMMUNITY): Payer: 59

## 2023-11-23 ENCOUNTER — Ambulatory Visit (HOSPITAL_COMMUNITY): Payer: 59

## 2023-11-26 ENCOUNTER — Ambulatory Visit (HOSPITAL_COMMUNITY): Payer: 59

## 2023-11-26 ENCOUNTER — Emergency Department: Payer: 59

## 2023-11-26 ENCOUNTER — Observation Stay
Admission: EM | Admit: 2023-11-26 | Discharge: 2023-11-28 | Disposition: A | Discharge status: Home / Self Care | Payer: 59 | Attending: Internal Medicine | Admitting: Internal Medicine

## 2023-11-26 ENCOUNTER — Other Ambulatory Visit: Payer: Self-pay

## 2023-11-26 DIAGNOSIS — F17210 Nicotine dependence, cigarettes, uncomplicated: Secondary | ICD-10-CM | POA: Diagnosis not present

## 2023-11-26 DIAGNOSIS — R059 Cough, unspecified: Secondary | ICD-10-CM | POA: Diagnosis present

## 2023-11-26 DIAGNOSIS — Z79899 Other long term (current) drug therapy: Secondary | ICD-10-CM | POA: Insufficient documentation

## 2023-11-26 DIAGNOSIS — J9601 Acute respiratory failure with hypoxia: Secondary | ICD-10-CM | POA: Insufficient documentation

## 2023-11-26 DIAGNOSIS — J45901 Unspecified asthma with (acute) exacerbation: Principal | ICD-10-CM | POA: Diagnosis present

## 2023-11-26 DIAGNOSIS — F1090 Alcohol use, unspecified, uncomplicated: Secondary | ICD-10-CM | POA: Diagnosis not present

## 2023-11-26 DIAGNOSIS — J4552 Severe persistent asthma with status asthmaticus: Secondary | ICD-10-CM | POA: Diagnosis not present

## 2023-11-26 DIAGNOSIS — F413 Other mixed anxiety disorders: Secondary | ICD-10-CM | POA: Insufficient documentation

## 2023-11-26 DIAGNOSIS — R0602 Shortness of breath: Secondary | ICD-10-CM

## 2023-11-26 LAB — COMPREHENSIVE METABOLIC PANEL
ALT: 64 U/L — ABNORMAL HIGH (ref 0–44)
AST: 120 U/L — ABNORMAL HIGH (ref 15–41)
Albumin: 3.5 g/dL (ref 3.5–5.0)
Alkaline Phosphatase: 123 U/L (ref 38–126)
Anion gap: 11 (ref 5–15)
BUN: 13 mg/dL (ref 6–20)
CO2: 28 mmol/L (ref 22–32)
Calcium: 8.4 mg/dL — ABNORMAL LOW (ref 8.9–10.3)
Chloride: 102 mmol/L (ref 98–111)
Creatinine, Ser: 0.62 mg/dL (ref 0.44–1.00)
GFR, Estimated: >60 mL/min (ref >60)
Glucose, Bld: 105 mg/dL — ABNORMAL HIGH (ref 70–99)
Potassium: 3.6 mmol/L (ref 3.5–5.1)
Sodium: 141 mmol/L (ref 135–145)
Total Bilirubin: 0.5 mg/dL (ref <1.2)
Total Protein: 6.4 g/dL — ABNORMAL LOW (ref 6.5–8.1)

## 2023-11-26 LAB — TROPONIN I (HIGH SENSITIVITY)
Troponin I (High Sensitivity): 5 ng/L (ref <18)
Troponin I (High Sensitivity): 6 ng/L (ref <18)

## 2023-11-26 LAB — CBC WITH DIFFERENTIAL/PLATELET
Abs Immature Granulocytes: 0.06 10*3/uL (ref 0.00–0.07)
Basophils Absolute: 0 10*3/uL (ref 0.0–0.1)
Basophils Relative: 0 %
Eosinophils Absolute: 0 10*3/uL (ref 0.0–0.5)
Eosinophils Relative: 1 %
HCT: 41.3 % (ref 36.0–46.0)
Hemoglobin: 13.6 g/dL (ref 12.0–15.0)
Immature Granulocytes: 1 %
Lymphocytes Relative: 30 %
Lymphs Abs: 2.4 10*3/uL (ref 0.7–4.0)
MCH: 32.2 pg (ref 26.0–34.0)
MCHC: 32.9 g/dL (ref 30.0–36.0)
MCV: 97.6 fL (ref 80.0–100.0)
Monocytes Absolute: 0.7 10*3/uL (ref 0.1–1.0)
Monocytes Relative: 9 %
Neutro Abs: 4.8 10*3/uL (ref 1.7–7.7)
Neutrophils Relative %: 59 %
Platelets: 176 10*3/uL (ref 150–400)
RBC: 4.23 MIL/uL (ref 3.87–5.11)
RDW: 14.6 % (ref 11.5–15.5)
WBC: 8.1 10*3/uL (ref 4.0–10.5)
nRBC: 0 % (ref 0.0–0.2)

## 2023-11-26 LAB — BRAIN NATRIURETIC PEPTIDE: B Natriuretic Peptide: 14.9 pg/mL (ref 0.0–100.0)

## 2023-11-26 LAB — TSH: TSH: 1.265 u[IU]/mL (ref 0.350–4.500)

## 2023-11-26 MED ORDER — FLUOXETINE HCL 20 MG PO CAPS
20.0000 mg | ORAL_CAPSULE | Freq: Every day | ORAL | Status: DC
  Administered 2023-11-26 – 2023-11-28 (×3): 20 mg via ORAL
  Filled 2023-11-26 (×3): qty 1

## 2023-11-26 MED ORDER — GABAPENTIN 100 MG PO CAPS
100.0000 mg | ORAL_CAPSULE | Freq: Three times a day (TID) | ORAL | Status: DC
  Administered 2023-11-26 – 2023-11-28 (×6): 100 mg via ORAL
  Filled 2023-11-26 (×6): qty 1

## 2023-11-26 MED ORDER — SODIUM CHLORIDE 0.9 % IV BOLUS
1000.0000 mL | Freq: Once | INTRAVENOUS | Status: AC
  Administered 2023-11-26: 1000 mL via INTRAVENOUS

## 2023-11-26 MED ORDER — IPRATROPIUM-ALBUTEROL 0.5-2.5 (3) MG/3ML IN SOLN
3.0000 mL | Freq: Once | RESPIRATORY_TRACT | Status: AC
  Administered 2023-11-26: 3 mL via RESPIRATORY_TRACT

## 2023-11-26 MED ORDER — MONTELUKAST SODIUM 10 MG PO TABS
10.0000 mg | ORAL_TABLET | Freq: Every day | ORAL | Status: DC
  Administered 2023-11-26 – 2023-11-28 (×3): 10 mg via ORAL
  Filled 2023-11-26 (×3): qty 1

## 2023-11-26 MED ORDER — LORAZEPAM 1 MG PO TABS
1.0000 mg | ORAL_TABLET | ORAL | Status: DC | PRN
  Administered 2023-11-26 (×2): 1 mg via ORAL
  Administered 2023-11-26: 2 mg via ORAL
  Administered 2023-11-26 – 2023-11-27 (×2): 1 mg via ORAL
  Administered 2023-11-27: 2 mg via ORAL
  Administered 2023-11-27: 1 mg via ORAL
  Administered 2023-11-27 (×2): 2 mg via ORAL
  Administered 2023-11-28: 1 mg via ORAL
  Filled 2023-11-26 (×2): qty 1
  Filled 2023-11-26: qty 2
  Filled 2023-11-26: qty 1
  Filled 2023-11-26: qty 2
  Filled 2023-11-26 (×2): qty 1
  Filled 2023-11-26: qty 2
  Filled 2023-11-26: qty 1
  Filled 2023-11-26: qty 2
  Filled 2023-11-26: qty 1

## 2023-11-26 MED ORDER — THIAMINE HCL 100 MG/ML IJ SOLN
100.0000 mg | Freq: Every day | INTRAMUSCULAR | Status: DC
  Filled 2023-11-26: qty 2

## 2023-11-26 MED ORDER — METHYLPREDNISOLONE SODIUM SUCC 125 MG IJ SOLR: 80.0000 mg | Freq: Two times a day (BID) | INTRAMUSCULAR | Status: DC

## 2023-11-26 MED ORDER — GUAIFENESIN-DM 100-10 MG/5ML PO SYRP
5.0000 mL | ORAL_SOLUTION | Freq: Once | ORAL | Status: AC
  Administered 2023-11-26: 5 mL via ORAL
  Filled 2023-11-26: qty 10

## 2023-11-26 MED ORDER — LEVALBUTEROL HCL 0.63 MG/3ML IN NEBU
0.6300 mg | INHALATION_SOLUTION | Freq: Four times a day (QID) | RESPIRATORY_TRACT | Status: DC
  Administered 2023-11-26 – 2023-11-27 (×4): 0.63 mg via RESPIRATORY_TRACT
  Filled 2023-11-26 (×4): qty 3

## 2023-11-26 MED ORDER — FOLIC ACID 1 MG PO TABS
1.0000 mg | ORAL_TABLET | Freq: Every day | ORAL | Status: DC
  Administered 2023-11-26 – 2023-11-28 (×3): 1 mg via ORAL
  Filled 2023-11-26 (×3): qty 1

## 2023-11-26 MED ORDER — METHYLPREDNISOLONE SODIUM SUCC 125 MG IJ SOLR
80.0000 mg | Freq: Two times a day (BID) | INTRAMUSCULAR | Status: DC
Start: 2023-11-26 — End: 2023-11-28
  Administered 2023-11-26 – 2023-11-28 (×4): 80 mg via INTRAVENOUS
  Filled 2023-11-26 (×4): qty 2

## 2023-11-26 MED ORDER — LORAZEPAM 2 MG/ML IJ SOLN
0.5000 mg | Freq: Once | INTRAMUSCULAR | Status: AC
  Administered 2023-11-26: 0.5 mg via INTRAVENOUS
  Filled 2023-11-26: qty 1

## 2023-11-26 MED ORDER — GUAIFENESIN 100 MG/5ML PO LIQD
5.0000 mL | ORAL | Status: DC | PRN
  Administered 2023-11-26 – 2023-11-28 (×6): 5 mL via ORAL
  Filled 2023-11-26 (×6): qty 10

## 2023-11-26 MED ORDER — METHYLPREDNISOLONE SODIUM SUCC 125 MG IJ SOLR
125.0000 mg | Freq: Once | INTRAMUSCULAR | Status: AC
  Administered 2023-11-26: 125 mg via INTRAVENOUS
  Filled 2023-11-26: qty 2

## 2023-11-26 MED ORDER — ONDANSETRON HCL 4 MG PO TABS: 4.0000 mg | ORAL_TABLET | Freq: Four times a day (QID) | ORAL | Status: DC | PRN

## 2023-11-26 MED ORDER — LORAZEPAM 2 MG/ML IJ SOLN: 1.0000 mg | INTRAMUSCULAR | Status: DC | PRN

## 2023-11-26 MED ORDER — IPRATROPIUM-ALBUTEROL 0.5-2.5 (3) MG/3ML IN SOLN: 3.0000 mL | Freq: Four times a day (QID) | RESPIRATORY_TRACT | Status: DC

## 2023-11-26 MED ORDER — LEVALBUTEROL HCL 0.63 MG/3ML IN NEBU
0.6300 mg | INHALATION_SOLUTION | Freq: Four times a day (QID) | RESPIRATORY_TRACT | Status: DC
  Administered 2023-11-26: 0.63 mg via RESPIRATORY_TRACT
  Filled 2023-11-26: qty 3

## 2023-11-26 MED ORDER — TRAZODONE HCL 50 MG PO TABS
50.0000 mg | ORAL_TABLET | Freq: Every evening | ORAL | Status: DC | PRN
Start: 2023-11-26 — End: 2023-11-28

## 2023-11-26 MED ORDER — THIAMINE MONONITRATE 100 MG PO TABS
100.0000 mg | ORAL_TABLET | Freq: Every day | ORAL | Status: DC
  Administered 2023-11-26 – 2023-11-28 (×3): 100 mg via ORAL
  Filled 2023-11-26 (×3): qty 1

## 2023-11-26 MED ORDER — PROGESTERONE MICRONIZED 100 MG PO CAPS
200.0000 mg | ORAL_CAPSULE | Freq: Every day | ORAL | Status: DC
  Administered 2023-11-26 – 2023-11-27 (×2): 200 mg via ORAL
  Filled 2023-11-26 (×2): qty 2

## 2023-11-26 MED ORDER — ACETAMINOPHEN 500 MG PO TABS
1000.0000 mg | ORAL_TABLET | Freq: Four times a day (QID) | ORAL | Status: DC | PRN
  Administered 2023-11-26 – 2023-11-27 (×3): 1000 mg via ORAL
  Filled 2023-11-26 (×3): qty 2

## 2023-11-26 MED ORDER — ENOXAPARIN SODIUM 40 MG/0.4ML IJ SOSY
40.0000 mg | PREFILLED_SYRINGE | INTRAMUSCULAR | Status: DC
  Administered 2023-11-26 – 2023-11-27 (×2): 40 mg via SUBCUTANEOUS
  Filled 2023-11-26 (×2): qty 0.4

## 2023-11-26 MED ORDER — UMECLIDINIUM-VILANTEROL 62.5-25 MCG/ACT IN AEPB
1.0000 | INHALATION_SPRAY | Freq: Every day | RESPIRATORY_TRACT | Status: DC
  Administered 2023-11-27 – 2023-11-28 (×2): 1 via RESPIRATORY_TRACT
  Filled 2023-11-26 (×2): qty 14

## 2023-11-26 MED ORDER — ONDANSETRON HCL 4 MG/2ML IJ SOLN: 4.0000 mg | Freq: Four times a day (QID) | INTRAMUSCULAR | Status: DC | PRN

## 2023-11-26 MED ORDER — ADULT MULTIVITAMIN W/MINERALS CH
1.0000 | ORAL_TABLET | Freq: Every day | ORAL | Status: DC
  Administered 2023-11-26 – 2023-11-28 (×3): 1 via ORAL
  Filled 2023-11-26 (×3): qty 1

## 2023-11-26 MED ORDER — LEVALBUTEROL TARTRATE 45 MCG/ACT IN AERO
2.0000 | INHALATION_SPRAY | Freq: Four times a day (QID) | RESPIRATORY_TRACT | Status: DC | PRN
  Filled 2023-11-26: qty 1

## 2023-11-26 MED ORDER — LORAZEPAM 0.5 MG PO TABS: 0.5000 mg | ORAL_TABLET | Freq: Four times a day (QID) | ORAL | Status: DC | PRN

## 2023-11-26 MED ORDER — ALBUTEROL SULFATE (2.5 MG/3ML) 0.083% IN NEBU: 2.5000 mg | INHALATION_SOLUTION | RESPIRATORY_TRACT | Status: DC | PRN

## 2023-11-26 MED ORDER — METHYLPREDNISOLONE SODIUM SUCC 40 MG IJ SOLR: 40.0000 mg | Freq: Four times a day (QID) | INTRAMUSCULAR | Status: DC

## 2023-11-26 MED ORDER — IPRATROPIUM-ALBUTEROL 0.5-2.5 (3) MG/3ML IN SOLN
3.0000 mL | Freq: Once | RESPIRATORY_TRACT | Status: AC
  Administered 2023-11-26: 3 mL via RESPIRATORY_TRACT
  Filled 2023-11-26: qty 3

## 2023-11-26 MED ORDER — NICOTINE 14 MG/24HR TD PT24
14.0000 mg | MEDICATED_PATCH | Freq: Every day | TRANSDERMAL | Status: DC
Start: 2023-11-26 — End: 2023-11-28
  Administered 2023-11-26 – 2023-11-28 (×3): 14 mg via TRANSDERMAL
  Filled 2023-11-26 (×3): qty 1

## 2023-11-26 MED ORDER — IOHEXOL 350 MG/ML SOLN
75.0000 mL | Freq: Once | INTRAVENOUS | Status: AC | PRN
  Administered 2023-11-26: 75 mL via INTRAVENOUS

## 2023-11-26 NOTE — H&P (Addendum)
History and Physical    [name] ZOX:[id number] DOB: 1966/01/14 DOA: 11/26/2023  PCP: Jerl Mina, MD (Confirm with patient/family/NH records and if not entered, this has to be entered at [facility] point of entry) Patient coming from: Home  I have personally briefly reviewed patient's old medical records in [facility]  Chief Complaint: Wheezing,  cough, SOB  HPI: [name] is a 57 y.o. female with medical history significant of moderate persistent-severe asthma on Dupixent, anxiety/depression, presented with worsening of cough wheezing shortness of breath.  Symptoms started about 2 weeks ago patient started to have dry cough and wheezing and shortness of breath after had contact with COVID-positive person and went to [facility] 7 days ago and was tested positive for COVID, and diagnosed with asthma exacerbation was given IV Solu-Medrol and sent home with Medrol pack.  Initially her symptoms improved however in the last 2 days cough wheezing shortness of breath became worse and decided to come to hospital.  Denies any fever chills or chest pains.  ED Course: Tachycardia tachypneic blood pressure elevated, chest x-ray negative for acute findings.  CBC BMP largely normal.  Patient was given IV Solu-Medrol and multiple rounds of breathing treatment but remained tachypneic and tachycardia.  Review of Systems: As per HPI otherwise 14 point review of systems negative.    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   History of anemia    Severe episode of recurrent major depressive disorder, without psychotic features (HCC)  01/07/2020   Sleep apnea    mild   SUI (stress urinary incontinence, female)     Past Surgical History:  Procedure Laterality Date   BLADDER SUSPENSION N/A 02/13/2013   Procedure: [facility];  Surgeon: Anner Crete, MD;  Location: [facility];  Service: Urology;  Laterality: N/A;   CESAREAN SECTION  12-23-2004    W/ LEFT TUBAL LIGATION AND RIGHT SALPINGECTOMY   CESAREAN SECTION  1995   CYSTOSCOPY WITH STENT PLACEMENT Bilateral 06/16/2020   Procedure: CYSTOSCOPY WITH FIREFLY INJECTION;  Surgeon: Crista Elliot, MD;  Location: [facility];  Service: Urology;  Laterality: Bilateral;   IR RADIOLOGIST EVAL & MGMT  04/21/2020   IR RADIOLOGIST EVAL & MGMT  05/12/2020   ORIF HUMERUS FRACTURE Right 04/28/2014   Procedure: OPEN REDUCTION INTERNAL FIXATION (ORIF) PROXIMAL HUMERUS FRACTURE;  Surgeon: Mable Paris, MD;  Location: [facility];  Service: Orthopedics;  Laterality: Right;   ORIF PROXIMAL HUMERUS FRACTURE Right 04/28/2014   DR CHANDLER   PROCTOSCOPY N/A 06/16/2020   Procedure: RIGID PROCTOSCOPY;  Surgeon: Karie Soda, MD;  Location: [facility];  Service: General;  Laterality: N/A;   TUBAL LIGATION       reports that she has been smoking cigarettes. She started smoking about 24 years ago. She has a 8 pack-year smoking history. She has never used smokeless tobacco. She reports current alcohol use of about 2.0 standard drinks of alcohol per week. She reports that she does not use drugs.  Allergies  Allergen Reactions   Codeine Nausea And Vomiting and Other (  See Comments)    Dizziness, also   Hydrocodone Other (See Comments)    Dizzy; Tolerates oxycodone fine    Family History  Adopted: Yes     Prior to Admission medications   Medication Sig Start Date End Date Taking? Authorizing Provider  acetaminophen (TYLENOL) 500 MG tablet Take 1,000 mg by mouth every 6 (six) hours as needed (For pain).    [provider]  albuterol (ACCUNEB) 1.25 MG/3ML nebulizer  solution Take 1 ampule by nebulization 3 (three) times daily as needed for wheezing or shortness of breath.    [provider]  albuterol (VENTOLIN HFA) 108 (90 Base) MCG/ACT inhaler Inhale 1-2 puffs into the lungs every 6 (six) hours as needed for wheezing. 10/14/23   [provider]  Ernestina Patches 62.5-25 MCG/ACT AEPB Inhale 1 puff into the lungs daily.    [provider]  cloNIDine (CATAPRES) 0.1 MG tablet Take 0.1 mg by mouth 2 (two) times daily as needed (For anxiety).    [provider]  DUPIXENT 300 MG/2ML SOAJ Inject 300 mg into the skin every 14 (fourteen) days.    [provider]  FLUoxetine (PROZAC) 20 MG capsule Take 1 capsule (20 mg total) by mouth daily. 10/26/23 11/25/23  Karie Fetch, MD  fluticasone [facility]) 50 MCG/ACT nasal spray Place 2 sprays into both nostrils daily.    [provider]  folic acid (FOLVITE) 1 MG tablet Take 1 mg by mouth daily.    [provider]  gabapentin (NEURONTIN) 100 MG capsule Take 1 capsule (100 mg total) by mouth 3 (three) times daily. 10/26/23 11/25/23  Karie Fetch, MD  montelukast (SINGULAIR) 10 MG tablet Take 10 mg by mouth daily. 10/19/23   [provider]  progesterone (PROMETRIUM) 100 MG capsule Take 200 mg by mouth at bedtime. 10/19/23   [provider]  SUMAtriptan (IMITREX) 100 MG tablet Take 1 tablet (100 mg total) by mouth every 2 (two) hours as needed for migraine. May repeat in 2 hours if headache persists or recurs. Do not exceed 2 tablets within a 24 hour period. Patient taking differently: Take 100 mg by mouth every 2 (two) hours as needed for migraine (May repeat in 2 hours if headache persists or recurs. Do not exceed 2 tablets within a 24 hour period.). 04/12/23   Narda Bonds, MD  traZODone (DESYREL) 50 MG tablet Take 1 tablet (50 mg total) by mouth at bedtime as needed for sleep. 10/26/23 11/25/23  Karie Fetch, MD  triamcinolone cream  (KENALOG) 0.5 % Apply topically 2 (two) times daily. Patient taking differently: Apply 1 Application topically 2 (two) times daily as needed (Apply to affected area). 08/25/23   Lewanda Rife, MD  Turmeric (QC TUMERIC COMPLEX PO) Take 2 capsules by mouth daily.    [provider]    Physical Exam: Vitals:   11/26/23 0400 11/26/23 0430 11/26/23 0605 11/26/23 0700  BP: (!) 147/77 (!) 153/81 (!) 151/88 (!) 155/94  Pulse: 94 (!) 121 (!) 103 100  Resp: (!) 22 (!) 22 15 16   Temp:      TempSrc:      SpO2: 96% 98% 99% 98%  Weight:      Height:        Constitutional: NAD, calm, comfortable Vitals:   11/26/23 0400 11/26/23 0430 11/26/23 0605 11/26/23 0700  BP: (!) 147/77 (!) 153/81 (!) 151/88 (!) 155/94  Pulse: 94 (!) 121 (!) 103 100  Resp: (!) 22 (!) 22 15 16  Temp:      TempSrc:      SpO2: 96% 98% 99% 98%  Weight:      Height:       Eyes: PERRL, lids and conjunctivae normal ENMT: Mucous membranes are moist. Posterior pharynx clear of any exudate or lesions.Normal dentition.  Neck: normal, supple, no masses, no thyromegaly Respiratory: Diminished breathing sound bilaterally, diffused wheezing, no crackles, increasing breathing effort . No accessory muscle use.  Cardiovascular: Regular rate and rhythm, no murmurs / rubs / gallops. No extremity edema. 2+ pedal pulses. No carotid bruits.  Abdomen: no tenderness, no masses palpated. No hepatosplenomegaly. Bowel sounds positive.  Musculoskeletal: no clubbing / cyanosis. No joint deformity upper and lower extremities. Good ROM, no contractures. Normal muscle tone.  Skin: no rashes, lesions, ulcers. No induration Neurologic: CN 2-12 grossly intact. Sensation intact, DTR normal. Strength 5/5 in all 4.  Psychiatric: Normal judgment and insight. Alert and oriented x 3. Normal mood.     Labs on Admission: I have personally reviewed following labs and imaging studies  CBC: Recent Labs  Lab 11/26/23 0340  WBC 8.1  NEUTROABS  4.8  HGB 13.6  HCT 41.3  MCV 97.6  PLT 176   Basic Metabolic Panel: Recent Labs  Lab 11/26/23 0340  NA 141  K 3.6  CL 102  CO2 28  GLUCOSE 105*  BUN 13  CREATININE 0.62  CALCIUM 8.4*   GFR: Estimated Creatinine Clearance: 76.3 mL/min (by C-G formula based on SCr of 0.62 mg/dL). Liver Function Tests: Recent Labs  Lab 11/26/23 0340  AST 120*  ALT 64*  ALKPHOS 123  BILITOT 0.5  PROT 6.4*  ALBUMIN 3.5   No results for input(s): "LIPASE", "AMYLASE" in the last 168 hours. No results for input(s): "AMMONIA" in the last 168 hours. Coagulation Profile: No results for input(s): "INR", "PROTIME" in the last 168 hours. Cardiac Enzymes: No results for input(s): "CKTOTAL", "CKMB", "CKMBINDEX", "TROPONINI" in the last 168 hours. BNP (last 3 results) No results for input(s): "PROBNP" in the last 8760 hours. HbA1C: No results for input(s): "HGBA1C" in the last 72 hours. CBG: No results for input(s): "GLUCAP" in the last 168 hours. Lipid Profile: No results for input(s): "CHOL", "HDL", "LDLCALC", "TRIG", "CHOLHDL", "LDLDIRECT" in the last 72 hours. Thyroid Function Tests: No results for input(s): "TSH", "T4TOTAL", "FREET4", "T3FREE", "THYROIDAB" in the last 72 hours. Anemia Panel: No results for input(s): "VITAMINB12", "FOLATE", "FERRITIN", "TIBC", "IRON", "RETICCTPCT" in the last 72 hours. Urine analysis:    Component Value Date/Time   COLORURINE YELLOW 12/26/2022 1720   APPEARANCEUR HAZY (A) 12/26/2022 1720   APPEARANCEUR Clear 06/29/2014 2140   LABSPEC 1.014 12/26/2022 1720   LABSPEC 1.024 06/29/2014 2140   PHURINE 8.0 12/26/2022 1720   GLUCOSEU NEGATIVE 12/26/2022 1720   GLUCOSEU Negative 06/29/2014 2140   HGBUR NEGATIVE 12/26/2022 1720   BILIRUBINUR NEGATIVE 12/26/2022 1720   BILIRUBINUR Negative 06/29/2014 2140   KETONESUR NEGATIVE 12/26/2022 1720   PROTEINUR NEGATIVE 12/26/2022 1720   UROBILINOGEN 0.2 04/22/2011 2011   NITRITE NEGATIVE 12/26/2022 1720    LEUKOCYTESUR NEGATIVE 12/26/2022 1720   LEUKOCYTESUR Negative 06/29/2014 2140    Radiological Exams on Admission: CT Angio Chest PE W/Cm &/Or Wo Cm Result Date: 11/26/2023 CLINICAL DATA:  Pulmonary embolism suspected, low to intermediate probability. EXAM: CT ANGIOGRAPHY CHEST WITH CONTRAST TECHNIQUE: Multidetector CT imaging of the chest was performed using the standard protocol during bolus administration of intravenous contrast. Multiplanar CT image reconstructions and MIPs were obtained to evaluate the  vascular anatomy. RADIATION DOSE REDUCTION: This exam was performed according to the departmental dose-optimization program which includes automated exposure control, adjustment of the mA and/or kV according to patient size and/or use of iterative reconstruction technique. CONTRAST:  75mL OMNIPAQUE IOHEXOL 350 MG/ML SOLN COMPARISON:  04/09/2023 FINDINGS: Cardiovascular: Satisfactory opacification of the pulmonary arteries to the segmental level. No evidence of pulmonary embolism. Normal heart size. No pericardial effusion. Mediastinum/Nodes: Negative for mass or adenopathy Lungs/Pleura: There is no edema, consolidation, effusion, or pneumothorax. Peripheral scarring or atelectasis in the anterior right lung where there is wispy and band like density with interstitial distortion, interval. Bronchi show diffuse airway thickening and partial collapse. 4 mm pulmonary nodule in the right lower lobe possibly also seen on 2021 and 2023 studies with somewhat variable location related to atelectasis. Nodule margins and size are distorted by motion artifact. Minimal centrilobular emphysema at the apices. Upper Abdomen: Hepatic steatosis suspected, although a postcontrast study. Musculoskeletal: Chronic compression fractures of T3, T5, and T8. Review of the MIP images confirms the above findings. IMPRESSION: 1. Negative for pulmonary embolism. 2. Atelectasis or scarring in the right anterior lung since 04/09/2023.  There is diffuse airway thickening and intermittent narrowing compatible with bronchomalacia. 3. Minimal centrilobular emphysema at the apices. Electronically Signed   By: Tiburcio Pea M.D.   On: 11/26/2023 05:16   DG Chest Port 1 View Result Date: 11/26/2023 CLINICAL DATA:  Shortness of breath EXAM: PORTABLE CHEST 1 VIEW COMPARISON:  11/18/2023 FINDINGS: Hyperinflation. Heart is borderline in size. Mediastinal contours are within normal limits. No acute confluent opacities, effusions or edema. Linear right mid lung opacities compatible with scarring. No acute bony abnormality. IMPRESSION: Hyperinflation. No acute cardiopulmonary disease. Electronically Signed   By: Charlett Nose M.D.   On: 11/26/2023 03:25    EKG: Independently reviewed.  Sinus tachycardia, no acute ST changes.  Assessment/Plan Principal Problem:   Asthma attacks lasting more than 24 hours Active Problems:   Asthma exacerbation  (please populate well all problems here in Problem List. (For example, if patient is on BP meds at home and you resume or decide to hold them, it is a problem that needs to be her. Same for CAD, COPD, HLD and so on)  Acute asthma exacerbation Acute hypoxic respiratory failure -Secondary to COVID exposure and ongoing cigarette smoking.  Patient reported 4 previous asthma exacerbation this year alone -Continue IV Solu-Medrol 40 mg every 6 hours -Change albuterol to Xopenex given to patient remains tachycardia, nebulizer plus as needed inhaler -Incentive spirometry and peak flow every 2 hours -At baseline patient has at least moderate persistent asthma, has been just recently started on Dupixent  Alcohol abuse Acute on chronic transaminitis, chronic alcoholic hepatitis -Will start CIWA protocol, will try to use only p.o. Ativan for high CIWA score, avoid IV Ativan given the patient is in respiratory distress -Estimated discriminant factor less than 32  Anxiety depression -Continue  SSRI  Cigarette smoking -Education of smoking cessation performed at bedside -On nicotine patch  DVT prophylaxis: Lovenox Code Status: Full code Family Communication: None at bedside Disposition Plan: Expect less than 2 midnight hospital stay Consults called: None Admission status: Telemetry observation   Emeline General MD [facility] Pager [phone]  11/26/2023, 7:45 AM

## 2023-11-26 NOTE — ED Notes (Signed)
 CCMD called.

## 2023-11-26 NOTE — ED Provider Notes (Signed)
[facility] Provider Note    Event Date/Time   First MD Initiated Contact with Patient 11/26/23 [phone]     (approximate)   History   Shortness of Breath   HPI  [name] is a 57 y.o. female who presents to the ED from home with a chief complaint of shortness of breath and wheezing.  Symptoms x 2 weeks.  Patient tested positive for COVID, seen at [facility] long ED 1 week ago.  On low-dose steroids per her pulmonologist.  Does not use oxygen at home.  Reports associated chest tightness.  Denies fever/chills, abdominal pain, nausea, vomiting or dizziness.     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   History of anemia    Severe episode of recurrent major depressive disorder, without psychotic features (HCC) 01/07/2020   Sleep apnea    mild   SUI (stress urinary incontinence, female)      Active Problem List   Patient Active Problem List   Diagnosis Date Noted   Major depressive disorder, 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/22/2023   Alcohol withdrawal (HCC) 04/10/2023   Asthma exacerbation 04/09/2023   Alcohol use disorder 12/26/2022   Asthma 12/26/2022   MDD (major depressive disorder), recurrent episode, severe (HCC) 12/25/2022   Depression, unspecified 01/06/2021   Alcohol use 01/06/2021   Insomnia 06/18/2020   SUI (stress urinary incontinence, female)    Hypertension    History of anemia    GERD (gastroesophageal reflux disease)    History of alcoholism (HCC) 02/25/2020   Intractable migraine with aura without status migrainosus 02/25/2020    Attention deficit hyperactivity disorder (ADHD), predominantly inattentive type 01/07/2020   GAD (generalized anxiety disorder) 01/07/2020   MDD (major depressive disorder), recurrent severe, without psychosis (HCC) 01/07/2020   Diverticulitis with abscess/fistula s/p robotic left colectomy 06/16/2020 09/06/2019   MDD (major depressive disorder) 10/07/2015   Alcohol abuse with alcohol-induced mood disorder (HCC) 10/06/2015     Past Surgical History   Past Surgical History:  Procedure Laterality Date   BLADDER SUSPENSION N/A 02/13/2013   Procedure: [facility] SLING;  Surgeon: Anner Crete, MD;  Location: [facility];  Service: Urology;  Laterality: N/A;   CESAREAN SECTION  12-23-2004    W/ LEFT TUBAL LIGATION AND RIGHT SALPINGECTOMY   CESAREAN SECTION  1995   CYSTOSCOPY WITH STENT PLACEMENT Bilateral 06/16/2020   Procedure: CYSTOSCOPY WITH FIREFLY INJECTION;  Surgeon: Crista Elliot, MD;  Location: [facility];  Service: Urology;  Laterality: Bilateral;   IR RADIOLOGIST EVAL & MGMT  04/21/2020   IR RADIOLOGIST EVAL & MGMT  05/12/2020   ORIF HUMERUS FRACTURE Right 04/28/2014   Procedure: OPEN REDUCTION INTERNAL FIXATION (ORIF) PROXIMAL HUMERUS FRACTURE;  Surgeon: Mable Paris, MD;  Location: [facility] OR;  Service: Orthopedics;  Laterality: Right;   ORIF PROXIMAL HUMERUS FRACTURE Right 04/28/2014   DR CHANDLER   PROCTOSCOPY N/A 06/16/2020   Procedure: RIGID PROCTOSCOPY;  Surgeon: Karie Soda, MD;  Location: [facility];  Service: General;  Laterality: N/A;  TUBAL LIGATION       Home Medications   Prior to Admission medications   Medication Sig Start Date End Date Taking? Authorizing Provider  acetaminophen (TYLENOL) 500 MG tablet Take 1,000 mg by mouth every 6 (six) hours as needed (For pain).    [provider]  albuterol (ACCUNEB) 1.25 MG/3ML nebulizer solution Take 1 ampule by nebulization 3 (three) times daily as needed for wheezing or shortness of breath.     [provider]  albuterol (VENTOLIN HFA) 108 (90 Base) MCG/ACT inhaler Inhale 1-2 puffs into the lungs every 6 (six) hours as needed for wheezing. 10/14/23   [provider]  Ernestina Patches 62.5-25 MCG/ACT AEPB Inhale 1 puff into the lungs daily.    [provider]  cloNIDine (CATAPRES) 0.1 MG tablet Take 0.1 mg by mouth 2 (two) times daily as needed (For anxiety).    [provider]  DUPIXENT 300 MG/2ML SOAJ Inject 300 mg into the skin every 14 (fourteen) days.    [provider]  FLUoxetine (PROZAC) 20 MG capsule Take 1 capsule (20 mg total) by mouth daily. 10/26/23 11/25/23  Karie Fetch, MD  fluticasone [facility]) 50 MCG/ACT nasal spray Place 2 sprays into both nostrils daily.    [provider]  folic acid (FOLVITE) 1 MG tablet Take 1 mg by mouth daily.    [provider]  gabapentin (NEURONTIN) 100 MG capsule Take 1 capsule (100 mg total) by mouth 3 (three) times daily. 10/26/23 11/25/23  Karie Fetch, MD  montelukast (SINGULAIR) 10 MG tablet Take 10 mg by mouth daily. 10/19/23   [provider]  progesterone (PROMETRIUM) 100 MG capsule Take 200 mg by mouth at bedtime. 10/19/23   [provider]  SUMAtriptan (IMITREX) 100 MG tablet Take 1 tablet (100 mg total) by mouth every 2 (two) hours as needed for migraine. May repeat in 2 hours if headache persists or recurs. Do not exceed 2 tablets within a 24 hour period. Patient taking differently: Take 100 mg by mouth every 2 (two) hours as needed for migraine (May repeat in 2 hours if headache persists or recurs. Do not exceed 2 tablets within a 24 hour period.). 04/12/23   Narda Bonds, MD  traZODone (DESYREL) 50 MG tablet Take 1 tablet (50 mg total) by mouth at bedtime as needed for sleep. 10/26/23 11/25/23  Karie Fetch, MD  triamcinolone cream (KENALOG) 0.5 % Apply topically 2 (two) times daily. Patient taking differently: Apply 1 Application topically 2  (two) times daily as needed (Apply to affected area). 08/25/23   Lewanda Rife, MD  Turmeric (QC TUMERIC COMPLEX PO) Take 2 capsules by mouth daily.    [provider]     Allergies  Codeine and Hydrocodone   Family History   Family History  Adopted: Yes     Physical Exam  Triage Vital Signs: ED Triage Vitals  Encounter Vitals Group     BP --      Systolic BP Percentile --      Diastolic BP Percentile --      Pulse --      Resp --      Temp --      Temp src --      SpO2 11/26/23 0250 98 %     Weight 11/26/23 0251 155 lb (70.3 kg)     Height 11/26/23 0251 5\' 5"  (1.651 m)     Head Circumference --      Peak Flow --  Pain Score 11/26/23 0251 5     Pain Loc --      Pain Education --      Exclude from Growth Chart --     Updated Vital Signs: BP (!) 153/81   Pulse (!) 121   Temp 98.4 F (36.9 C) (Oral)   Resp (!) 22   Ht 5\' 5"  (1.651 m)   Wt 70.3 kg   SpO2 98%   BMI 25.79 kg/m    General: Awake, mild distress.  CV:  Tachycardiac.  Good peripheral perfusion. Resp:  Increased effort.  Diminished aeration with scattered wheezing. Abd:  Nontender.  No distention.  Other:  Bilateral calves are supple and nontender.   ED Results / Procedures / Treatments  Labs (all labs ordered are listed, but only abnormal results are displayed) Labs Reviewed  COMPREHENSIVE METABOLIC PANEL - Abnormal; Notable for the following components:      Result Value   Glucose, Bld 105 (*)    Calcium 8.4 (*)    Total Protein 6.4 (*)    AST 120 (*)    ALT 64 (*)    All other components within normal limits  CBC WITH DIFFERENTIAL/PLATELET  BRAIN NATRIURETIC PEPTIDE  TROPONIN I (HIGH SENSITIVITY)  TROPONIN I (HIGH SENSITIVITY)     EKG  ED ECG REPORT I, Tuesday Terlecki J, the attending physician, personally viewed and interpreted this ECG.   Date: 11/26/2023  EKG Time: 0254  Rate: 114  Rhythm: sinus tachycardia  Axis: Normal  Intervals:none  ST&T Change:  Nonspecific    RADIOLOGY  I have independently visualized and interpreted patient's imaging study as well as noted the radiology interpretation:  Chest x-ray: No acute cardiopulmonary process  CTA chest: No PE  Official radiology report(s): CT Angio Chest PE W/Cm &/Or Wo Cm Result Date: 11/26/2023 CLINICAL DATA:  Pulmonary embolism suspected, low to intermediate probability. EXAM: CT ANGIOGRAPHY CHEST WITH CONTRAST TECHNIQUE: Multidetector CT imaging of the chest was performed using the standard protocol during bolus administration of intravenous contrast. Multiplanar CT image reconstructions and MIPs were obtained to evaluate the vascular anatomy. RADIATION DOSE REDUCTION: This exam was performed according to the departmental dose-optimization program which includes automated exposure control, adjustment of the mA and/or kV according to patient size and/or use of iterative reconstruction technique. CONTRAST:  75mL OMNIPAQUE IOHEXOL 350 MG/ML SOLN COMPARISON:  04/09/2023 FINDINGS: Cardiovascular: Satisfactory opacification of the pulmonary arteries to the segmental level. No evidence of pulmonary embolism. Normal heart size. No pericardial effusion. Mediastinum/Nodes: Negative for mass or adenopathy Lungs/Pleura: There is no edema, consolidation, effusion, or pneumothorax. Peripheral scarring or atelectasis in the anterior right lung where there is wispy and band like density with interstitial distortion, interval. Bronchi show diffuse airway thickening and partial collapse. 4 mm pulmonary nodule in the right lower lobe possibly also seen on 2021 and 2023 studies with somewhat variable location related to atelectasis. Nodule margins and size are distorted by motion artifact. Minimal centrilobular emphysema at the apices. Upper Abdomen: Hepatic steatosis suspected, although a postcontrast study. Musculoskeletal: Chronic compression fractures of T3, T5, and T8. Review of the MIP images confirms the  above findings. IMPRESSION: 1. Negative for pulmonary embolism. 2. Atelectasis or scarring in the right anterior lung since 04/09/2023. There is diffuse airway thickening and intermittent narrowing compatible with bronchomalacia. 3. Minimal centrilobular emphysema at the apices. Electronically Signed   By: Tiburcio Pea M.D.   On: 11/26/2023 05:16   DG Chest Port 1 View Result Date: 11/26/2023 CLINICAL DATA:  Shortness of breath EXAM: PORTABLE CHEST 1 VIEW COMPARISON:  11/18/2023 FINDINGS: Hyperinflation. Heart is borderline in size. Mediastinal contours are within normal limits. No acute confluent opacities, effusions or edema. Linear right mid lung opacities compatible with scarring. No acute bony abnormality. IMPRESSION: Hyperinflation. No acute cardiopulmonary disease. Electronically Signed   By: Charlett Nose M.D.   On: 11/26/2023 03:25     PROCEDURES:  Critical Care performed: No  .1-3 Lead EKG Interpretation  Performed by: Irean Hong, MD Authorized by: Irean Hong, MD     Interpretation: abnormal     ECG rate:  115   ECG rate assessment: tachycardic     Rhythm: sinus tachycardia     Ectopy: none     Conduction: normal   Comments:     Patient placed on cardiac monitor to evaluate for arrhythmias    MEDICATIONS ORDERED IN ED: Medications  albuterol (VENTOLIN HFA) 108 (90 Base) MCG/ACT inhaler 2 puff (has no administration in time range)  ipratropium-albuterol (DUONEB) 0.5-2.5 (3) MG/3ML nebulizer solution 3 mL (3 mLs Nebulization Given 11/26/23 0258)  sodium chloride 0.9 % bolus 1,000 mL (0 mLs Intravenous Stopped 11/26/23 0502)  guaiFENesin-dextromethorphan (ROBITUSSIN DM) 100-10 MG/5ML syrup 5 mL (5 mLs Oral Given 11/26/23 0344)  methylPREDNISolone sodium succinate (SOLU-MEDROL) 125 mg/2 mL injection 125 mg (125 mg Intravenous Given 11/26/23 0343)  iohexol (OMNIPAQUE) 350 MG/ML injection 75 mL (75 mLs Intravenous Contrast Given 11/26/23 0428)  sodium chloride 0.9 % bolus  1,000 mL (1,000 mLs Intravenous New Bag/Given 11/26/23 0501)  ipratropium-albuterol (DUONEB) 0.5-2.5 (3) MG/3ML nebulizer solution 3 mL (3 mLs Nebulization Given 11/26/23 0502)  LORazepam (ATIVAN) injection 0.5 mg (0.5 mg Intravenous Given 11/26/23 0530)     IMPRESSION / MDM / ASSESSMENT AND PLAN / ED COURSE  I reviewed the triage vital signs and the nursing notes.                             57 year old female presenting with shortness of breath. Differential includes, but is not limited to, viral syndrome, bronchitis including COPD exacerbation, pneumonia, reactive airway disease including asthma, CHF including exacerbation with or without pulmonary/interstitial edema, pneumothorax, ACS, thoracic trauma, and pulmonary embolism.  I have personally reviewed patient's records and note her ED visit on 11/18/2023.  Patient's presentation is most consistent with acute presentation with potential threat to life or bodily function.  The patient is on the cardiac monitor to evaluate for evidence of arrhythmia and/or significant heart rate changes.  Will obtain cardiac panel, initiate IV fluids, Solu-Medrol, DuoNeb.  Obtain CTA chest to evaluate for PE.  Will reassess.  Clinical Course as of 11/26/23 0551  Mon Nov 26, 2023  0448 Wheezing improved, aeration remains diminished.  Will administer second DuoNeb.  Administer second liter IV fluids for tachycardia heart rate 120s.  Awaiting CTA chest results. [JS]  0548 Low-dose Ativan given for patient feeling anxious.  CTA negative for PE.  Remains tachypneic; 2 L nasal cannula placed for comfort.  Will consult hospitalist services for evaluation and admission. [JS]    Clinical Course User Index [JS] Irean Hong, MD     FINAL CLINICAL IMPRESSION(S) / ED DIAGNOSES   Final diagnoses:  Moderate asthma with exacerbation, unspecified whether persistent  Shortness of breath     Rx / DC Orders   ED Discharge Orders     None        Note:   This document  was prepared using Conservation officer, historic buildings and may include unintentional dictation errors.   Irean Hong, MD 11/26/23 [phone]

## 2023-11-26 NOTE — ED Notes (Signed)
Resumed care from rachel rn.  Pt alert.  Meds given.  Pt on 2l iters oxygen   reports sob is better.   Breathing treatment given to pt.

## 2023-11-26 NOTE — ED Notes (Signed)
Pharmacy tech at bedside for med rec 

## 2023-11-26 NOTE — ED Notes (Signed)
Pt eating dinner meal  family with pt.  Pt alert.  Pt waiting on admission

## 2023-11-26 NOTE — ED Triage Notes (Signed)
Pt reports asthma exacerbation without relief of home nebs/inhalers. Recently seen at [facility] and given steroids which pt has been taking. Audible wheeze noted. Pt notably SOB and tachypnic. DOE and some while at rest. Reports back pain between shoulder blades and chest feels tight. Pt speaking in short sentences. Symmetric chest rise and fall. Voice hoarse.

## 2023-11-26 NOTE — TOC Initial Note (Signed)
Transition of Care [facility]) - Initial/Assessment Note    Patient Details  Name: [name] MRN: [id number] Date of Birth: 1966/11/17  Transition of [facility]) CM/SW Contact:    Margarito Liner, LCSW Phone Number: 11/26/2023, 12:47 PM  Clinical Narrative:   CSW met with patient. No supports at bedside. CSW introduced role and inquired about interest in SA resources. Patient is agreeable. CSW added them to her AVS. PCP is Jerl Mina, MD. She drives herself to her appointments. [facility] [place]. NO issues obtaining medications. Patient lives home alone. No home health or DME use prior to admission. She is currently on acute oxygen. CSW will follow for this potential home need. No further concerns. CSW encouraged patient to contact CSW as needed. CSW will continue to follow patient for support and facilitate return home once stable. [name] will transport her home at discharge.               Expected Discharge Plan: Home/Self Care Barriers to Discharge: Continued Medical Work up   Patient Goals and CMS Choice            Expected Discharge Plan and Services     Post Acute Care Choice: NA Living arrangements for the past 2 months: Single Family Home                                      Prior Living Arrangements/Services Living arrangements for the past 2 months: Single Family Home Lives with:: Self Patient language and need for interpreter reviewed:: Yes Do you feel safe going back to the place where you live?: Yes      Need for Family Participation in Patient Care: Yes (Comment)     Criminal Activity/Legal Involvement Pertinent to Current Situation/Hospitalization: No - Comment as needed  Activities of Daily Living      Permission Sought/Granted                  Emotional Assessment Appearance:: Appears stated age Attitude/Demeanor/Rapport: Engaged, Gracious Affect (typically observed): Accepting, Appropriate, Calm,  Pleasant Orientation: : Oriented to Self, Oriented to Place, Oriented to  Time, Oriented to Situation Alcohol / Substance Use: Alcohol Use Psych Involvement: No (comment)  Admission diagnosis:  Asthma attacks lasting more than 24 hours [J45.901] Patient Active Problem List   Diagnosis Date Noted   Asthma attacks lasting more than 24 hours 11/26/2023   Major depressive disorder, 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/22/2023   Alcohol withdrawal (HCC) 04/10/2023   Asthma exacerbation 04/09/2023   Alcohol use disorder 12/26/2022   Asthma 12/26/2022   MDD (major depressive disorder), recurrent episode, severe (HCC) 12/25/2022   Depression, unspecified 01/06/2021   Alcohol use 01/06/2021   Insomnia 06/18/2020   SUI (stress urinary incontinence, female)    Hypertension    History of anemia    GERD (gastroesophageal reflux disease)    History of alcoholism (HCC) 02/25/2020   Intractable migraine with aura without status migrainosus 02/25/2020   Attention deficit hyperactivity disorder (ADHD), predominantly inattentive type 01/07/2020   GAD (generalized anxiety disorder) 01/07/2020   MDD (major depressive disorder), recurrent severe, without psychosis (HCC) 01/07/2020   Diverticulitis with abscess/fistula s/p robotic left colectomy 06/16/2020 09/06/2019   MDD (major depressive disorder) 10/07/2015   Alcohol abuse with alcohol-induced mood disorder (HCC) 10/06/2015   PCP:  Jerl Mina, MD Pharmacy[phone]  [place],  - [address] [place] [address] Phone: [phone] Fax: [phone]     Social Drivers of Health (SDOH) Social History: SDOH Screenings   Food Insecurity: No Food Insecurity (10/24/2023)  Recent Concern: Food Insecurity - Food Insecurity Present (07/26/2023)   [facility]  Housing: Medium Risk (10/24/2023)  Transportation Needs: No Transportation Needs (10/24/2023)   Utilities: At Risk (10/24/2023)  Alcohol Screen: Medium Risk (08/22/2023)  Depression (PHQ2-9): High Risk (10/29/2023)  Financial Resource Strain: High Risk (07/26/2023)   Received from [facility]  Tobacco Use: High Risk (11/26/2023)   SDOH Interventions:     Readmission Risk Interventions     No data to display

## 2023-11-26 NOTE — Discharge Instructions (Signed)

## 2023-11-27 DIAGNOSIS — J45901 Unspecified asthma with (acute) exacerbation: Secondary | ICD-10-CM | POA: Diagnosis not present

## 2023-11-27 MED ORDER — BENZONATATE 100 MG PO CAPS
100.0000 mg | ORAL_CAPSULE | Freq: Two times a day (BID) | ORAL | Status: DC | PRN
  Administered 2023-11-27 – 2023-11-28 (×2): 100 mg via ORAL
  Filled 2023-11-27 (×2): qty 1

## 2023-11-27 MED ORDER — LEVALBUTEROL HCL 0.63 MG/3ML IN NEBU
0.6300 mg | INHALATION_SOLUTION | Freq: Four times a day (QID) | RESPIRATORY_TRACT | Status: DC
  Administered 2023-11-27 – 2023-11-28 (×4): 0.63 mg via RESPIRATORY_TRACT
  Filled 2023-11-27 (×4): qty 3

## 2023-11-27 MED ORDER — CYCLOBENZAPRINE HCL 10 MG PO TABS
5.0000 mg | ORAL_TABLET | Freq: Three times a day (TID) | ORAL | Status: DC
  Administered 2023-11-27 – 2023-11-28 (×3): 5 mg via ORAL
  Filled 2023-11-27 (×3): qty 1

## 2023-11-27 MED ORDER — LEVALBUTEROL TARTRATE 45 MCG/ACT IN AERO
2.0000 | INHALATION_SPRAY | Freq: Four times a day (QID) | RESPIRATORY_TRACT | Status: DC | PRN
  Filled 2023-11-27: qty 1

## 2023-11-27 NOTE — Progress Notes (Signed)
Progress Note   Patient: [name] DOA: 11/26/2023     0 DOS: the patient was seen and examined on 11/27/2023   Brief hospital course: From HPI: "[name] is a 57 y.o. female with medical history significant of moderate persistent-severe asthma on Dupixent, anxiety/depression, presented with worsening of cough wheezing shortness of breath.   Symptoms started about 2 weeks ago patient started to have dry cough and wheezing and shortness of breath after had contact with COVID-positive person and went to [place] [facility] 7 days ago and was tested positive for COVID, and diagnosed with asthma exacerbation was given IV Solu-Medrol and sent home with Medrol pack.  Initially her symptoms improved however in the last 2 days cough wheezing shortness of breath became worse and decided to come to hospital.  Denies any fever chills or chest pains.   ED Course: Tachycardia tachypneic blood pressure elevated, chest x-ray negative for acute findings.  CBC BMP largely normal.  Patient was given IV Solu-Medrol and multiple rounds of breathing treatment but remained tachypneic and tachycardia."    Assessment and Plan:  Acute asthma exacerbation Acute hypoxic respiratory failure -Secondary to COVID exposure and ongoing cigarette smoking.  Patient reported 4 previous asthma exacerbation this year alone Continue IV Solu-Medrol 40 mg every 6 hours continue  Xopenex  Continue-Incentive spirometry and peak flow every 2 hours -At baseline patient has at least moderate persistent asthma, has been just recently started on Dupixent   Alcohol abuse Acute on chronic transaminitis, chronic alcoholic hepatitis Continue CIWA protocol   Anxiety depression Continue SSRI   Cigarette smoking Continue nicotine patch Patient has been counseled on smoking cessation   DVT prophylaxis: Lovenox  Code Status: Full code Family Communication: None at bedside Disposition Plan: Home when  medically stable Consults called: None   Subjective:  Patient seen and examined at bedside She tells me she is not feeling good for discharge today She is still having wheezing and cough Denies worsening shortness of breath or chest pain  Physical Exam: General: Middle-age female appears acutely ill Neck: normal, supple, no masses, no thyromegaly Respiratory: Generalized wheezing present bilaterally Cardiovascular: Regular rate and rhythm, no murmurs   Abdomen: no tenderness Musculoskeletal: no clubbing / cyanosis.  Neurologic: CN 2-12 grossly intact. Sensation intact, Psychiatric: Normal judgment and insight. Alert and oriented x 3.    Data Reviewed:    Latest Ref Rng & Units 11/26/2023    3:40 AM 11/18/2023    3:34 PM 10/23/2023    4:20 PM  BMP  Glucose 70 - 99 mg/dL 332  951  65   BUN 6 - 20 mg/dL 13  9  8    Creatinine 0.44 - 1.00 mg/dL 8.84  1.66  0.63   Sodium 135 - 145 mmol/L 141  138  144   Potassium 3.5 - 5.1 mmol/L 3.6  2.7  4.2   Chloride 98 - 111 mmol/L 102  106  105   CO2 22 - 32 mmol/L 28  23  26    Calcium 8.9 - 10.3 mg/dL 8.4  8.1  8.8        Latest Ref Rng & Units 11/26/2023    3:40 AM 11/18/2023    3:34 PM 10/23/2023    4:20 PM  CBC  WBC 4.0 - 10.5 K/uL 8.1  5.3  6.0   Hemoglobin 12.0 - 15.0 g/dL 01.6  01.0  93.2   Hematocrit 36.0 - 46.0 % 41.3  42.7  46.8  Platelets 150 - 400 K/uL 176  227  243     Vitals:   11/27/23 1015 11/27/23 1117 11/27/23 1410 11/27/23 1444  BP:  (!) 153/89  (!) 147/90  Pulse:  93  100  Resp:      Temp:      TempSrc:      SpO2: 98% 94% 98% 95%  Weight:      Height:        Time spent: 45 minutes  Author: Loyce Dys, MD 11/27/2023 4:48 PM  For on call review [email or web address].

## 2023-11-28 DIAGNOSIS — J45901 Unspecified asthma with (acute) exacerbation: Secondary | ICD-10-CM | POA: Diagnosis not present

## 2023-11-28 LAB — CBC WITH DIFFERENTIAL/PLATELET
Abs Immature Granulocytes: 0.14 10*3/uL — ABNORMAL HIGH (ref 0.00–0.07)
Basophils Absolute: 0 10*3/uL (ref 0.0–0.1)
Basophils Relative: 0 %
Eosinophils Absolute: 0 10*3/uL (ref 0.0–0.5)
Eosinophils Relative: 0 %
HCT: 43 % (ref 36.0–46.0)
Hemoglobin: 14.1 g/dL (ref 12.0–15.0)
Immature Granulocytes: 1 %
Lymphocytes Relative: 14 %
Lymphs Abs: 1.6 10*3/uL (ref 0.7–4.0)
MCH: 33.2 pg (ref 26.0–34.0)
MCHC: 32.8 g/dL (ref 30.0–36.0)
MCV: 101.2 fL — ABNORMAL HIGH (ref 80.0–100.0)
Monocytes Absolute: 0.8 10*3/uL (ref 0.1–1.0)
Monocytes Relative: 7 %
Neutro Abs: 9.2 10*3/uL — ABNORMAL HIGH (ref 1.7–7.7)
Neutrophils Relative %: 78 %
Platelets: 145 10*3/uL — ABNORMAL LOW (ref 150–400)
RBC: 4.25 MIL/uL (ref 3.87–5.11)
RDW: 14.5 % (ref 11.5–15.5)
WBC: 11.8 10*3/uL — ABNORMAL HIGH (ref 4.0–10.5)
nRBC: 0 % (ref 0.0–0.2)

## 2023-11-28 LAB — BASIC METABOLIC PANEL
Anion gap: 10 (ref 5–15)
BUN: 25 mg/dL — ABNORMAL HIGH (ref 6–20)
CO2: 25 mmol/L (ref 22–32)
Calcium: 8.7 mg/dL — ABNORMAL LOW (ref 8.9–10.3)
Chloride: 103 mmol/L (ref 98–111)
Creatinine, Ser: 0.7 mg/dL (ref 0.44–1.00)
GFR, Estimated: >60 mL/min (ref >60)
Glucose, Bld: 108 mg/dL — ABNORMAL HIGH (ref 70–99)
Potassium: 3.8 mmol/L (ref 3.5–5.1)
Sodium: 138 mmol/L (ref 135–145)

## 2023-11-28 MED ORDER — PREDNISONE 10 MG PO TABS: ORAL_TABLET | ORAL | 0 refills | Status: AC

## 2023-11-28 MED ORDER — GABAPENTIN 100 MG PO CAPS: 100.0000 mg | ORAL_CAPSULE | Freq: Three times a day (TID) | ORAL | 0 refills | Status: DC

## 2023-11-28 MED ORDER — PREDNISONE 20 MG PO TABS: 40.0000 mg | ORAL_TABLET | Freq: Every day | ORAL | Status: DC

## 2023-11-28 MED ORDER — VITAMIN B-1 100 MG PO TABS: 100.0000 mg | ORAL_TABLET | Freq: Every day | ORAL | 0 refills | Status: AC

## 2023-11-28 MED ORDER — BENZONATATE 100 MG PO CAPS: 100.0000 mg | ORAL_CAPSULE | Freq: Two times a day (BID) | ORAL | 0 refills | Status: DC | PRN

## 2023-11-28 NOTE — Progress Notes (Signed)
   Patient Saturations on Room Air at Rest = 99%  Patient Saturations on [employer] while Ambulating = 96%

## 2023-11-28 NOTE — Discharge Summary (Signed)
Physician Discharge Summary   Patient: [name] MRN: [id number] DOB: 1966-06-25  Admit date:     11/26/2023  Discharge date: 11/28/23  Discharge Physician: Loyce Dys   PCP: Jerl Mina, MD   Recommendations at discharge:  Follow-up with primary care physician and pulmonologist  Discharge Diagnoses: Acute asthma exacerbation Acute hypoxic respiratory failure Alcohol abuse Anxiety depression Cigarette smoking    Hospital Course: [name] is a 57 y.o. female with medical history significant of moderate persistent-severe asthma on Dupixent, anxiety/depression, presented with worsening of cough wheezing shortness of breath. Symptoms started about 2 weeks ago patient started to have dry cough and wheezing and shortness of breath after had contact with COVID-positive person and went to [place] long 7 days before presentation and was tested positive for COVID, and diagnosed with asthma exacerbation was given IV Solu-Medrol and sent home with Medrol pack.  Initially her symptoms improved however in the last 2 days before admission cough wheezing shortness of breath became worse and decided to come to hospital.  She required more nebulization as well as IV steroid therapy which was later transitioned to oral steroid.  Patient underwent a walk test that showed she does not meet criteria for home oxygen.  She is therefore being discharged today to follow-up with outpatient physicians.    Consultants: None Procedures performed: None Disposition: Home Diet recommendation:  Cardiac diet DISCHARGE MEDICATION: Allergies as of 11/28/2023       Reactions   Codeine Nausea And Vomiting, Other (See Comments)   Dizziness, also   Hydrocodone Other (See Comments)   Dizzy; Tolerates oxycodone fine        Medication List     STOP taking these medications    azithromycin 250 MG tablet Commonly known as: ZITHROMAX       TAKE these medications    acamprosate 333 MG  tablet Commonly known as: CAMPRAL Take 666 mg by mouth 3 (three) times daily.   acetaminophen 500 MG tablet Commonly known as: TYLENOL Take 1,000 mg by mouth every 6 (six) hours as needed (For pain).   albuterol 1.25 MG/3ML nebulizer solution Commonly known as: ACCUNEB Take 1 ampule by nebulization 3 (three) times daily as needed for wheezing or shortness of breath.   albuterol 108 (90 Base) MCG/ACT inhaler Commonly known as: VENTOLIN HFA Inhale 1-2 puffs into the lungs every 6 (six) hours as needed for wheezing.   Anoro Ellipta 62.5-25 MCG/ACT Aepb Generic drug: umeclidinium-vilanterol Inhale 1 puff into the lungs daily.   benzonatate 100 MG capsule Commonly known as: TESSALON Take 1 capsule (100 mg total) by mouth 2 (two) times daily as needed for cough.   busPIRone 15 MG tablet Commonly known as: BUSPAR Take 7.5 mg by mouth 2 (two) times daily.   cloNIDine 0.1 MG tablet Commonly known as: CATAPRES Take 0.1 mg by mouth 2 (two) times daily as needed (For anxiety).   Dupixent 300 MG/2ML Soaj Generic drug: Dupilumab Inject 300 mg into the skin every 14 (fourteen) days.   FLUoxetine 20 MG capsule Commonly known as: PROZAC Take 1 capsule (20 mg total) by mouth daily.   fluticasone 50 MCG/ACT nasal spray Commonly known as: FLONASE Place 2 sprays into both nostrils daily.   folic acid 1 MG tablet Commonly known as: FOLVITE Take 1 mg by mouth daily.   gabapentin 100 MG capsule Commonly known as: NEURONTIN Take 1 capsule (100 mg total) by mouth 3 (three) times daily.   lamoTRIgine 100 MG tablet  Commonly known as: LAMICTAL Take 50 mg by mouth 2 (two) times daily.   montelukast 10 MG tablet Commonly known as: SINGULAIR Take 10 mg by mouth daily.   predniSONE 10 MG tablet Commonly known as: DELTASONE Take 4 tablets (40 mg total) by mouth daily for 3 days, THEN 2 tablets (20 mg total) daily for 3 days, THEN 1 tablet (10 mg total) daily for 5 days. Start taking on:  November 28, 2023 What changed: See the new instructions.   progesterone 100 MG capsule Commonly known as: PROMETRIUM Take 200 mg by mouth at bedtime.   QC TUMERIC COMPLEX PO Take 2 capsules by mouth daily.   SUMAtriptan 100 MG tablet Commonly known as: IMITREX Take 1 tablet (100 mg total) by mouth every 2 (two) hours as needed for migraine. May repeat in 2 hours if headache persists or recurs. Do not exceed 2 tablets within a 24 hour period.   thiamine 100 MG tablet Commonly known as: Vitamin B-1 Take 1 tablet (100 mg total) by mouth daily. Start taking on: November 29, 2023   traZODone 50 MG tablet Commonly known as: DESYREL Take 1 tablet (50 mg total) by mouth at bedtime as needed for sleep.   triamcinolone cream 0.5 % Commonly known as: KENALOG Apply topically 2 (two) times daily.        Discharge Exam: Filed Weights   11/26/23 0251 11/27/23 0858  Weight: 70.3 kg 71.4 kg   General: Not in acute distress Neck: normal, supple, no masses, no thyromegaly Respiratory: Wheezing improved Cardiovascular: Regular rate and rhythm, no murmurs   Abdomen: no tenderness Musculoskeletal: no clubbing / cyanosis.  Neurologic: CN 2-12 grossly intact. Sensation intact, Psychiatric: Normal judgment and insight. Alert and oriented x 3.      Condition at discharge: good  The results of significant diagnostics from this hospitalization (including imaging, microbiology, ancillary and laboratory) are listed below for reference.   Imaging Studies: CT Angio Chest PE W/Cm &/Or Wo Cm Result Date: 11/26/2023 CLINICAL DATA:  Pulmonary embolism suspected, low to intermediate probability. EXAM: CT ANGIOGRAPHY CHEST WITH CONTRAST TECHNIQUE: Multidetector CT imaging of the chest was performed using the standard protocol during bolus administration of intravenous contrast. Multiplanar CT image reconstructions and MIPs were obtained to evaluate the vascular anatomy. RADIATION DOSE REDUCTION:  This exam was performed according to the departmental dose-optimization program which includes automated exposure control, adjustment of the mA and/or kV according to patient size and/or use of iterative reconstruction technique. CONTRAST:  75mL OMNIPAQUE IOHEXOL 350 MG/ML SOLN COMPARISON:  04/09/2023 FINDINGS: Cardiovascular: Satisfactory opacification of the pulmonary arteries to the segmental level. No evidence of pulmonary embolism. Normal heart size. No pericardial effusion. Mediastinum/Nodes: Negative for mass or adenopathy Lungs/Pleura: There is no edema, consolidation, effusion, or pneumothorax. Peripheral scarring or atelectasis in the anterior right lung where there is wispy and band like density with interstitial distortion, interval. Bronchi show diffuse airway thickening and partial collapse. 4 mm pulmonary nodule in the right lower lobe possibly also seen on 2021 and 2023 studies with somewhat variable location related to atelectasis. Nodule margins and size are distorted by motion artifact. Minimal centrilobular emphysema at the apices. Upper Abdomen: Hepatic steatosis suspected, although a postcontrast study. Musculoskeletal: Chronic compression fractures of T3, T5, and T8. Review of the MIP images confirms the above findings. IMPRESSION: 1. Negative for pulmonary embolism. 2. Atelectasis or scarring in the right anterior lung since 04/09/2023. There is diffuse airway thickening and intermittent narrowing compatible with bronchomalacia. 3.  Minimal centrilobular emphysema at the apices. Electronically Signed   By: Tiburcio Pea M.D.   On: 11/26/2023 05:16   DG Chest Port 1 View Result Date: 11/26/2023 CLINICAL DATA:  Shortness of breath EXAM: PORTABLE CHEST 1 VIEW COMPARISON:  11/18/2023 FINDINGS: Hyperinflation. Heart is borderline in size. Mediastinal contours are within normal limits. No acute confluent opacities, effusions or edema. Linear right mid lung opacities compatible with scarring.  No acute bony abnormality. IMPRESSION: Hyperinflation. No acute cardiopulmonary disease. Electronically Signed   By: Charlett Nose M.D.   On: 11/26/2023 03:25   DG Chest Port 1 View Result Date: 11/18/2023 CLINICAL DATA:  Shortness of breath EXAM: PORTABLE CHEST 1 VIEW COMPARISON:  04/09/2023, 01/04/2016 FINDINGS: No acute airspace disease or effusion. Stable cardiomediastinal silhouette. Slight asymmetric right hilar opacity. No pneumothorax IMPRESSION: No active disease. Slight asymmetric right hilar opacity, possibly due to vascular summation, suggest two-view chest radiographic follow-up. Electronically Signed   By: Jasmine Pang M.D.   On: 11/18/2023 15:59    Microbiology: Results for orders placed or performed during the hospital encounter of 11/18/23  Resp panel by RT-PCR (RSV, Flu A&B, Covid) Anterior Nasal Swab     Status: Abnormal   Collection Time: 11/18/23  3:36 PM   Specimen: Anterior Nasal Swab  Result Value Ref Range Status   SARS Coronavirus 2 by RT PCR POSITIVE (A) NEGATIVE Final    Comment: (NOTE) SARS-CoV-2 target nucleic acids are DETECTED.  The SARS-CoV-2 RNA is generally detectable in upper respiratory specimens during the acute phase of infection. Positive results are indicative of the presence of the identified virus, but do not rule out bacterial infection or co-infection with other pathogens not detected by the test. Clinical correlation with patient history and other diagnostic information is necessary to determine patient infection status. The expected result is Negative.  Fact Sheet for Patients: BloggerCourse.com  Fact Sheet for Healthcare Providers: SeriousBroker.it  This test is not yet approved or cleared by the [place] FDA and  has been authorized for detection and/or diagnosis of SARS-CoV-2 by FDA under an Emergency Use Authorization (EUA).  This EUA will remain in effect (meaning this test can  be used) for the duration of  the COVID-19 declaration under Section 564(b)(1) of the A ct, 21 U.S.C. section 360bbb-3(b)(1), unless the authorization is terminated or revoked sooner.     Influenza A by PCR NEGATIVE NEGATIVE Final   Influenza B by PCR NEGATIVE NEGATIVE Final    Comment: (NOTE) The Xpert Xpress SARS-CoV-2/FLU/RSV plus assay is intended as an aid in the diagnosis of influenza from Nasopharyngeal swab specimens and should not be used as a sole basis for treatment. Nasal washings and aspirates are unacceptable for Xpert Xpress SARS-CoV-2/FLU/RSV testing.  Fact Sheet for Patients: BloggerCourse.com  Fact Sheet for Healthcare Providers: SeriousBroker.it  This test is not yet approved or cleared by the [place] FDA and has been authorized for detection and/or diagnosis of SARS-CoV-2 by FDA under an Emergency Use Authorization (EUA). This EUA will remain in effect (meaning this test can be used) for the duration of the COVID-19 declaration under Section 564(b)(1) of the Act, 21 U.S.C. section 360bbb-3(b)(1), unless the authorization is terminated or revoked.     Resp Syncytial Virus by PCR NEGATIVE NEGATIVE Final    Comment: (NOTE) Fact Sheet for Patients: BloggerCourse.com  Fact Sheet for Healthcare Providers: SeriousBroker.it  This test is not yet approved or cleared by the [place] FDA and has been authorized for detection and/or  diagnosis of SARS-CoV-2 by FDA under an Emergency Use Authorization (EUA). This EUA will remain in effect (meaning this test can be used) for the duration of the COVID-19 declaration under Section 564(b)(1) of the Act, 21 U.S.C. section 360bbb-3(b)(1), unless the authorization is terminated or revoked.  Performed at [facility], [address], [place], [place] [address]     Labs: CBC: Recent Labs   Lab 11/26/23 0340 11/28/23 0615  WBC 8.1 11.8*  NEUTROABS 4.8 9.2*  HGB 13.6 14.1  HCT 41.3 43.0  MCV 97.6 101.2*  PLT 176 145*   Basic Metabolic Panel: Recent Labs  Lab 11/26/23 0340 11/28/23 0615  NA 141 138  K 3.6 3.8  CL 102 103  CO2 28 25  GLUCOSE 105* 108*  BUN 13 25*  CREATININE 0.62 0.70  CALCIUM 8.4* 8.7*   Liver Function Tests: Recent Labs  Lab 11/26/23 0340  AST 120*  ALT 64*  ALKPHOS 123  BILITOT 0.5  PROT 6.4*  ALBUMIN 3.5   CBG: No results for input(s): "GLUCAP" in the last 168 hours.  Discharge time spent:  38 minutes.  Signed: Loyce Dys, MD [facility] 11/28/2023

## 2023-11-28 NOTE — Plan of Care (Signed)

## 2023-11-28 NOTE — TOC Progression Note (Signed)
Transition of Care [facility]) - Progression Note    Patient Details  Name: [name] MRN: [id number] Date of Birth: 18-Feb-1966  Transition of Care [facility]) CM/SW Contact  Marlowe Sax, RN Phone Number: 11/28/2023, 8:53 AM  Clinical Narrative:     The patient is now on Room Air, no additonal needs from [facility] noted  Expected Discharge Plan: Home/Self Care Barriers to Discharge: Continued Medical Work up  Expected Discharge Plan and Services     Post Acute Care Choice: NA Living arrangements for the past 2 months: Single Family Home                                       Social Determinants of Health (SDOH) Interventions SDOH Screenings   Food Insecurity: No Food Insecurity (11/27/2023)  Housing: Low Risk  (11/27/2023)  Recent Concern: Housing - Medium Risk (10/24/2023)  Transportation Needs: No Transportation Needs (11/27/2023)  Utilities: At Risk (11/27/2023)  Alcohol Screen: Medium Risk (08/22/2023)  Depression (PHQ2-9): High Risk (10/29/2023)  Financial Resource Strain: High Risk (07/26/2023)   Received from [facility]  Tobacco Use: High Risk (11/26/2023)    Readmission Risk Interventions     No data to display

## 2023-11-30 ENCOUNTER — Ambulatory Visit (HOSPITAL_COMMUNITY): Payer: 59

## 2023-12-03 ENCOUNTER — Ambulatory Visit (HOSPITAL_COMMUNITY): Payer: 59

## 2023-12-07 ENCOUNTER — Ambulatory Visit (HOSPITAL_COMMUNITY): Payer: 59

## 2023-12-10 ENCOUNTER — Ambulatory Visit (HOSPITAL_COMMUNITY): Payer: 59

## 2023-12-12 ENCOUNTER — Ambulatory Visit (HOSPITAL_COMMUNITY): Payer: 59

## 2023-12-14 ENCOUNTER — Ambulatory Visit (HOSPITAL_COMMUNITY): Payer: 59

## 2023-12-17 ENCOUNTER — Ambulatory Visit (HOSPITAL_COMMUNITY): Payer: 59

## 2023-12-19 ENCOUNTER — Ambulatory Visit (HOSPITAL_COMMUNITY): Payer: 59

## 2023-12-21 ENCOUNTER — Ambulatory Visit (HOSPITAL_COMMUNITY): Payer: 59

## 2024-01-10 ENCOUNTER — Ambulatory Visit (HOSPITAL_COMMUNITY)
Admission: EM | Admit: 2024-01-10 | Discharge: 2024-01-10 | Disposition: A | Discharge status: Other Acute Inpatient Hospital | Payer: 59

## 2024-01-10 ENCOUNTER — Other Ambulatory Visit (HOSPITAL_COMMUNITY)
Admission: EM | Admit: 2024-01-10 | Discharge: 2024-01-12 | Disposition: A | Discharge status: Home / Self Care | Payer: 59 | Attending: Psychiatry | Admitting: Psychiatry

## 2024-01-10 DIAGNOSIS — F10129 Alcohol abuse with intoxication, unspecified: Secondary | ICD-10-CM

## 2024-01-10 DIAGNOSIS — Z91148 Patient's other noncompliance with medication regimen for other reason: Secondary | ICD-10-CM | POA: Diagnosis not present

## 2024-01-10 DIAGNOSIS — R45851 Suicidal ideations: Secondary | ICD-10-CM | POA: Insufficient documentation

## 2024-01-10 DIAGNOSIS — F17210 Nicotine dependence, cigarettes, uncomplicated: Secondary | ICD-10-CM | POA: Insufficient documentation

## 2024-01-10 DIAGNOSIS — F1010 Alcohol abuse, uncomplicated: Secondary | ICD-10-CM | POA: Diagnosis present

## 2024-01-10 DIAGNOSIS — R4589 Other symptoms and signs involving emotional state: Secondary | ICD-10-CM | POA: Diagnosis not present

## 2024-01-10 DIAGNOSIS — F411 Generalized anxiety disorder: Secondary | ICD-10-CM | POA: Diagnosis not present

## 2024-01-10 DIAGNOSIS — F339 Major depressive disorder, recurrent, unspecified: Secondary | ICD-10-CM | POA: Diagnosis not present

## 2024-01-10 LAB — POCT URINE DRUG SCREEN - MANUAL ENTRY (I-SCREEN)
POC Amphetamine UR: NOT DETECTED
POC Buprenorphine (BUP): NOT DETECTED
POC Cocaine UR: NOT DETECTED
POC Marijuana UR: NOT DETECTED
POC Methadone UR: NOT DETECTED
POC Methamphetamine UR: NOT DETECTED
POC Morphine: NOT DETECTED
POC Oxazepam (BZO): NOT DETECTED
POC Oxycodone UR: NOT DETECTED
POC Secobarbital (BAR): NOT DETECTED

## 2024-01-10 LAB — POC URINE PREG, ED: Preg Test, Ur: NEGATIVE

## 2024-01-10 MED ORDER — LORAZEPAM 1 MG PO TABS: 1.0000 mg | ORAL_TABLET | Freq: Four times a day (QID) | ORAL | Status: DC | PRN

## 2024-01-10 MED ORDER — THIAMINE HCL 100 MG/ML IJ SOLN
100.0000 mg | Freq: Once | INTRAMUSCULAR | Status: AC
  Administered 2024-01-11: 100 mg via INTRAMUSCULAR
  Filled 2024-01-10: qty 2

## 2024-01-10 MED ORDER — LOPERAMIDE HCL 2 MG PO CAPS: 2.0000 mg | ORAL_CAPSULE | ORAL | Status: DC | PRN

## 2024-01-10 MED ORDER — ONDANSETRON 4 MG PO TBDP: 4.0000 mg | ORAL_TABLET | Freq: Four times a day (QID) | ORAL | Status: DC | PRN

## 2024-01-10 MED ORDER — MAGNESIUM HYDROXIDE 400 MG/5ML PO SUSP: 30.0000 mL | Freq: Every day | ORAL | Status: DC | PRN

## 2024-01-10 MED ORDER — OLANZAPINE 10 MG IM SOLR: 10.0000 mg | Freq: Three times a day (TID) | INTRAMUSCULAR | Status: DC | PRN

## 2024-01-10 MED ORDER — LORAZEPAM 1 MG PO TABS: 1.0000 mg | ORAL_TABLET | Freq: Every day | ORAL | Status: DC

## 2024-01-10 MED ORDER — ACETAMINOPHEN 325 MG PO TABS
650.0000 mg | ORAL_TABLET | Freq: Four times a day (QID) | ORAL | Status: DC | PRN
  Administered 2024-01-11: 650 mg via ORAL
  Filled 2024-01-10: qty 2

## 2024-01-10 MED ORDER — THIAMINE MONONITRATE 100 MG PO TABS
100.0000 mg | ORAL_TABLET | Freq: Every day | ORAL | Status: DC
  Administered 2024-01-11 – 2024-01-12 (×2): 100 mg via ORAL
  Filled 2024-01-10 (×2): qty 1

## 2024-01-10 MED ORDER — LORAZEPAM 1 MG PO TABS
1.0000 mg | ORAL_TABLET | Freq: Three times a day (TID) | ORAL | Status: DC
  Administered 2024-01-11 – 2024-01-12 (×2): 1 mg via ORAL
  Filled 2024-01-10 (×2): qty 1

## 2024-01-10 MED ORDER — LORAZEPAM 1 MG PO TABS: 1.0000 mg | ORAL_TABLET | Freq: Two times a day (BID) | ORAL | Status: DC

## 2024-01-10 MED ORDER — ALUM & MAG HYDROXIDE-SIMETH 200-200-20 MG/5ML PO SUSP: 30.0000 mL | ORAL | Status: DC | PRN

## 2024-01-10 MED ORDER — ADULT MULTIVITAMIN W/MINERALS CH
1.0000 | ORAL_TABLET | Freq: Every day | ORAL | Status: DC
  Administered 2024-01-11 – 2024-01-12 (×2): 1 via ORAL
  Filled 2024-01-10 (×2): qty 1

## 2024-01-10 MED ORDER — HYDROXYZINE HCL 25 MG PO TABS: 25.0000 mg | ORAL_TABLET | Freq: Four times a day (QID) | ORAL | Status: DC | PRN

## 2024-01-10 MED ORDER — OLANZAPINE 10 MG IM SOLR: 5.0000 mg | Freq: Three times a day (TID) | INTRAMUSCULAR | Status: DC | PRN

## 2024-01-10 MED ORDER — LORAZEPAM 1 MG PO TABS
1.0000 mg | ORAL_TABLET | Freq: Four times a day (QID) | ORAL | Status: AC
  Administered 2024-01-11 (×4): 1 mg via ORAL
  Filled 2024-01-10 (×4): qty 1

## 2024-01-10 MED ORDER — OLANZAPINE 5 MG PO TBDP: 5.0000 mg | ORAL_TABLET | Freq: Three times a day (TID) | ORAL | Status: DC | PRN

## 2024-01-10 NOTE — BH Assessment (Deleted)
 P

## 2024-01-10 NOTE — Progress Notes (Signed)
   01/10/24 2147  Patient Reported Information  How Did You Hear About Us ? Family/Friend  What Is the Reason for Your Visit/Call Today? Pt's boyfriend reports, the pt is very depressed, needs to get put back on her medications (for anxiety, etc). Pt reports, she's been drinking a lot (amount unknown) of bootleggers. Pt denies, SI, HI, hallucinations, self-injurious behaviors and access to weapons.  How Long Has This Been Causing You Problems? > than 6 months  What Do You Feel Would Help You the Most Today? Stress Management;Treatment for Depression or other mood problem;Alcohol or Drug Use Treatment  Have You Recently Had Any Thoughts About Hurting Yourself? No  Are You Planning to Commit Suicide/Harm Yourself At This time? No  Have you Recently Had Thoughts About Hurting Someone [name]? No  Are You Planning To Harm Someone At This Time? No  Explanation: None.  Physical Abuse Denies  Verbal Abuse Denies  Sexual Abuse Denies  Exploitation of patient/patient's resources Denies  Self-Neglect Denies  Possible abuse reported to: Other (Comment) (None.)  Have You Used Any Alcohol or Drugs in the Past 24 Hours? Yes  What Did You Use and How Much? Pt reports, drinking a lot of bootleggers prior to arrival.  Do You Currently Have a Therapist/Psychiatrist? Yes  Name of Therapist/Psychiatrist Pt is linked to Dr. Shirline for medication management but has not seen him in months.  Have You Been Recently Discharged From Any Office Practice or Programs? No  [facility] Screening Triage Referral Assessment  Type of Contact Face-to-Face  Location of Assessment [facility] Assessment Services  Provider location [facility] Assessment Services  Collateral Involvement [name], boyfriend, [phone].  Does Patient Have a Automotive Engineer Guardian? No  Legal Guardian Contact Information Pt is her own guardian.  Copy of Legal Guardianship Form in Chart  (Pt is her own guardian.)  Legal Guardian Notified of Arrival    (Pt is her own guardian.)  Legal Guardian Notified of Pending Discharge   (Pt is her own guardian.)  If Minor and Not Living with Parent(s), Who has Custody? Pt is an adult.  Is CPS involved or ever been involved? Never  Is APS involved or ever been involved? Never  Patient Determined To Be At Risk for Harm To Self or Others Based on Review of Patient Reported Information or Presenting Complaint? No  Method No Plan  Availability of Means No access or NA  Intent Vague intent or NA  Notification Required No need or identified person  Additional Information for Danger to Others Potential  (None.)  Additional Comments for Danger to Others Potential None.  Are There Guns or Other Weapons in Your Home? No  Types of Guns/Weapons Pt denies, access to weapons.  Are These Weapons Safely Secured?  (Pt denies, access to weapons.)  Who Could Verify You Are Able To Have These Secured: Pt denies, access to weapons.  Do You Have any Outstanding Charges, Pending Court Dates, Parole/Probation? None.  Contacted To Inform of Risk of Harm To Self or Others: Other: Comment (None.)  Does Patient Present under Involuntary Commitment? No  [place] of Residence [place]  Patient Currently Receiving the Following Services: Not Receiving Services  Determination of Need Urgent (48 hours)  Options For Referral Medication Management    Determination of need: Urgent.    Jackson [name], MS, [facility], [facility] Triage Specialist [phone]

## 2024-01-10 NOTE — ED Provider Notes (Signed)
 Facility Based Crisis Admission H&P  Date: 01/11/24 Patient Name: [name] MRN: [id number] Chief Complaint: need detox from alcohol   Diagnoses:  Final diagnoses:  Alcohol abuse with intoxication (HCC)  Recurrent major depressive disorder, remission status unspecified (HCC)  Ineffective coping    HPI: [name], 58 y/o female with a history of alcohol abuse, major depressive disorder, suicidal ideation.  Presented to [facility] voluntarily accompanied by her husband.  Per the patient she is trying to get detox from alcohol according to her she drinks alcohol everyday especially in the mornings when she wakes up.  According to the patient she last drank on her way coming here.  When asked how much alcohol that she consumed daily patient stated a whole lot.  Patient does acting nonchalant way when asked questions about her alcoholism.  And even smile and laugh. Patient does not appear to be danger to herself or others at this time.  Denies access to guns.  Patient denies seeing a psychiatrist at this time stating that her psychiatrist does not take her call anymore and so she is not taking any medications.  Patient denies seeing a therapist at this time.  Face-to-face evaluation of patient, patient is alert and oriented x 4, speech is clear, maintained minimal eye contact.  Patient is dressed for the weather.  Patient denies SI, HI, AVH or paranoia.  Reports consuming large quantity of alcohol on a daily basis did not quantify the amount.  Patient denies wanting to hurt herself or others, denies access to guns.  At this time patient does not seem to be influenced by internal or external stimuli.  According to patient she has done multiple detox program in the past and relapsed.  Discussed with patient the need for compliance with detox program.  Patient agreed with plan of care.  PHQ9 completed,  pt score 18 moderate severe depression   Recommend inpatient [facility]    PHQ 2-9:  Flowsheet Row  ED from 01/10/2024 in [facility] Counselor from 10/29/2023 in [facility] ED from 10/24/2023 in [facility]  Thoughts that you would be better off dead, or of hurting yourself in some way Not at all Not at all Not at all  PHQ-9 Total Score 18 17 11        Flowsheet Row ED from 01/10/2024 in [facility] Most recent reading at 01/11/2024 12:20 AM ED from 01/10/2024 in [facility] Most recent reading at 01/10/2024 10:42 PM ED to Hosp-Admission (Discharged) from 11/26/2023 in [facility] 1C MEDICAL TELEMETRY Most recent reading at 11/26/2023  2:52 AM  C-SSRS RISK CATEGORY No Risk No Risk No Risk         Total Time spent with patient: 20 minutes  Musculoskeletal  Strength & Muscle Tone: within normal limits Gait & Station: normal Patient leans: N/A  Psychiatric Specialty Exam  Presentation General Appearance:  Casual  Eye Contact: Fair  Speech: Clear and Coherent  Speech Volume: Decreased  Handedness: Right   Mood and Affect  Mood: Anxious  Affect: Flat   Thought Process  Thought Processes: Coherent  Descriptions of Associations:Circumstantial  Orientation:Full (Time, Place and Person)  Thought Content:WDL  Diagnosis of Schizophrenia or Schizoaffective disorder in past: No   Hallucinations:Hallucinations: None  Ideas of Reference:None  Suicidal Thoughts:Suicidal Thoughts: No  Homicidal Thoughts:Homicidal Thoughts: No   Sensorium  Memory: Immediate Fair  Judgment: Poor  Insight: Fair  Executive Functions  Concentration: Fair  Attention Span: Fair  Recall: [employer] of Knowledge: Fair  Language: Fair   Psychomotor Activity  Psychomotor Activity: Psychomotor Activity: Normal   Assets  Assets: Desire for Improvement; Resilience   Sleep  Sleep: Sleep: Fair Number  of Hours of Sleep: 6   Nutritional Assessment (For OBS and FBC admissions only) Has the patient had a weight loss or gain of 10 pounds or more in the last 3 months?: No Has the patient had a decrease in food intake/or appetite?: No Does the patient have dental problems?: No Does the patient have eating habits or behaviors that may be indicators of an eating disorder including binging or inducing vomiting?: No Has the patient recently lost weight without trying?: 0 Has the patient been eating poorly because of a decreased appetite?: 0 Malnutrition Screening Tool Score: 0    Physical Exam HENT:     Head: Normocephalic.     Nose: Nose normal.  Eyes:     Pupils: Pupils are equal, round, and reactive to light.  Cardiovascular:     Rate and Rhythm: Normal rate.  Pulmonary:     Effort: Pulmonary effort is normal.  Musculoskeletal:        General: Normal range of motion.     Cervical back: Normal range of motion.  Neurological:     General: No focal deficit present.     Mental Status: She is alert.  Psychiatric:        Mood and Affect: Mood normal.        Behavior: Behavior normal.        Thought Content: Thought content normal.        Judgment: Judgment normal.    Review of Systems  Constitutional: Negative.   HENT: Negative.    Eyes: Negative.   Respiratory: Negative.    Cardiovascular: Negative.   Gastrointestinal: Negative.   Genitourinary: Negative.   Musculoskeletal: Negative.   Skin: Negative.   Neurological: Negative.   Psychiatric/Behavioral:  Positive for depression and substance abuse. The patient is nervous/anxious.     Blood pressure 128/88, pulse (!) 112, temperature 98.4 F (36.9 C), temperature source Oral, resp. rate 18, SpO2 96%. There is no height or weight on file to calculate BMI.  Past Psychiatric History: Alcohol abuse, suicidal ideation, MDD  Is the patient at risk to self? No  Has the patient been a risk to self in the past 6 months? No .     Has the patient been a risk to self within the distant past? No   Is the patient a risk to others? No   Has the patient been a risk to others in the past 6 months? No   Has the patient been a risk to others within the distant past? No   Past Medical History: see chart  Family History: unknown Social History: alcohol   Last Labs:  Admission on 01/10/2024  Component Date Value Ref Range Status   WBC 01/10/2024 8.1  4.0 - 10.5 K/uL Final   RBC 01/10/2024 4.39  3.87 - 5.11 MIL/uL Final   Hemoglobin 01/10/2024 14.8  12.0 - 15.0 g/dL Final   HCT 97/93/7974 43.8  36.0 - 46.0 % Final   MCV 01/10/2024 99.8  80.0 - 100.0 fL Final   MCH 01/10/2024 33.7  26.0 - 34.0 pg Final   MCHC 01/10/2024 33.8  30.0 - 36.0 g/dL Final   RDW 97/93/7974 14.6  11.5 - 15.5 % Final  Platelets 01/10/2024 199  150 - 400 K/uL Final   nRBC 01/10/2024 0.0  0.0 - 0.2 % Final   Neutrophils Relative % 01/10/2024 42  % Final   Neutro Abs 01/10/2024 3.4  1.7 - 7.7 K/uL Final   Lymphocytes Relative 01/10/2024 47  % Final   Lymphs Abs 01/10/2024 3.8  0.7 - 4.0 K/uL Final   Monocytes Relative 01/10/2024 8  % Final   Monocytes Absolute 01/10/2024 0.6  0.1 - 1.0 K/uL Final   Eosinophils Relative 01/10/2024 2  % Final   Eosinophils Absolute 01/10/2024 0.1  0.0 - 0.5 K/uL Final   Basophils Relative 01/10/2024 1  % Final   Basophils Absolute 01/10/2024 0.1  0.0 - 0.1 K/uL Final   Immature Granulocytes 01/10/2024 0  % Final   Abs Immature Granulocytes 01/10/2024 0.02  0.00 - 0.07 K/uL Final   Performed at [facility], [address] [place], [place] [address]   Sodium 01/10/2024 141  135 - 145 mmol/L Final   Potassium 01/10/2024 3.6  3.5 - 5.1 mmol/L Final   Chloride 01/10/2024 101  98 - 111 mmol/L Final   CO2 01/10/2024 27  22 - 32 mmol/L Final   Glucose, Bld 01/10/2024 113 (H)  70 - 99 mg/dL Final   Glucose reference range applies only to samples taken after fasting for at least 8 hours.   BUN 01/10/2024 <5 (L)   6 - 20 mg/dL Final   Creatinine, Ser 01/10/2024 0.78  0.44 - 1.00 mg/dL Final   Calcium  01/10/2024 8.8 (L)  8.9 - 10.3 mg/dL Final   Total Protein 97/93/7974 6.2 (L)  6.5 - 8.1 g/dL Final   Albumin 97/93/7974 3.7  3.5 - 5.0 g/dL Final   AST 97/93/7974 136 (H)  15 - 41 U/L Final   ALT 01/10/2024 56 (H)  0 - 44 U/L Final   Alkaline Phosphatase 01/10/2024 171 (H)  38 - 126 U/L Final   Total Bilirubin 01/10/2024 0.7  0.0 - 1.2 mg/dL Final   GFR, Estimated 01/10/2024 >60  >60 mL/min Final   Comment: (NOTE) Calculated using the CKD-EPI Creatinine Equation (2021)    Anion gap 01/10/2024 13  5 - 15 Final   Performed at [facility], [address] [place], [place] [address]   Alcohol, Ethyl (B) 01/10/2024 329 (HH)  <10 mg/dL Final   Comment: CRITICAL RESULT CALLED TO, READ BACK BY AND VERIFIED WITH BING PARAS, RN [phone] 01/11/2024 SANDOVAL K (NOTE) Lowest detectable limit for serum alcohol is 10 mg/dL.  For medical purposes only. Performed at [facility], [address], [place], [place] [address]    Free T4 01/10/2024 0.59 (L)  0.61 - 1.12 ng/dL Final   Comment: (NOTE) Biotin ingestion may interfere with free T4 tests. If the results are inconsistent with the TSH level, previous test results, or the clinical presentation, then consider biotin interference. If needed, order repeat testing after stopping biotin. Performed at [facility], [address], [place], [place] [address]    Hepatitis B Surface Ag 01/10/2024 NON REACTIVE  NON REACTIVE Final   HCV Ab 01/10/2024 NON REACTIVE  NON REACTIVE Final   Comment: (NOTE) Nonreactive HCV antibody screen is consistent with no HCV infections,  unless recent infection is suspected or other evidence exists to indicate HCV infection.     Hep A IgM 01/10/2024 NON REACTIVE  NON REACTIVE Final   Hep B C IgM 01/10/2024 NON REACTIVE  NON  REACTIVE Final   Performed at [facility], [address], [place], [place] [address]    Preg Test, Ur 01/10/2024 Negative  Negative Final   POC Amphetamine  UR 01/10/2024 None Detected  NONE DETECTED (Cut Off Level 1000 ng/mL) Final   POC Secobarbital (BAR) 01/10/2024 None Detected  NONE DETECTED (Cut Off Level 300 ng/mL) Final   POC Buprenorphine (BUP) 01/10/2024 None Detected  NONE DETECTED (Cut Off Level 10 ng/mL) Final   POC Oxazepam (BZO) 01/10/2024 None Detected  NONE DETECTED (Cut Off Level 300 ng/mL) Final   POC Cocaine UR 01/10/2024 None Detected  NONE DETECTED (Cut Off Level 300 ng/mL) Final   POC Methamphetamine UR 01/10/2024 None Detected  NONE DETECTED (Cut Off Level 1000 ng/mL) Final   POC Morphine  01/10/2024 None Detected  NONE DETECTED (Cut Off Level 300 ng/mL) Final   POC Methadone UR 01/10/2024 None Detected  NONE DETECTED (Cut Off Level 300 ng/mL) Final   POC Oxycodone  UR 01/10/2024 None Detected  NONE DETECTED (Cut Off Level 100 ng/mL) Final   POC Marijuana UR 01/10/2024 None Detected  NONE DETECTED (Cut Off Level 50 ng/mL) Final   Cholesterol 01/10/2024 147  0 - 200 mg/dL Final   Triglycerides 97/93/7974 494 (H)  <150 mg/dL Final   HDL 97/93/7974 79  >40 mg/dL Final   Total CHOL/HDL Ratio 01/10/2024 1.9  RATIO Final   VLDL 01/10/2024 UNABLE TO CALCULATE IF TRIGLYCERIDE OVER 400 mg/dL  0 - 40 mg/dL Final   LDL Cholesterol 01/10/2024 UNABLE TO CALCULATE IF TRIGLYCERIDE OVER 400 mg/dL  0 - 99 mg/dL Final   Comment:        Total Cholesterol/HDL:CHD Risk Coronary Heart Disease Risk Table                     Men   Women  1/2 Average Risk   3.4   3.3  Average Risk       5.0   4.4  2 X Average Risk   9.6   7.1  3 X Average Risk  23.4   11.0        Use the calculated Patient Ratio above and the CHD Risk Table to determine the patient's CHD Risk.        ATP III CLASSIFICATION (LDL):  <100     mg/dL   Optimal  899-870  mg/dL   Near or Above                    Optimal  130-159  mg/dL   Borderline  839-810  mg/dL   High  >809     mg/dL   Very  High Performed at [facility], [address] [place], [place] [address]    TSH 01/10/2024 0.786  0.350 - 4.500 uIU/mL Final   Comment: Performed by a 3rd Generation assay with a functional sensitivity of <=0.01 uIU/mL. Performed at [facility], [address] [place], [place] [address]    Direct LDL 01/10/2024 43  0 - 99 mg/dL Final   Performed at [facility], [address], [place], [place] [address]  Admission on 11/26/2023, Discharged on 11/28/2023  Component Date Value Ref Range Status   WBC 11/26/2023 8.1  4.0 - 10.5 K/uL Final   RBC 11/26/2023 4.23  3.87 - 5.11 MIL/uL Final   Hemoglobin 11/26/2023 13.6  12.0 - 15.0 g/dL Final   HCT 87/76/7975 41.3  36.0 - 46.0 % Final  MCV 11/26/2023 97.6  80.0 - 100.0 fL Final   MCH 11/26/2023 32.2  26.0 - 34.0 pg Final   MCHC 11/26/2023 32.9  30.0 - 36.0 g/dL Final   RDW 87/76/7975 14.6  11.5 - 15.5 % Final   Platelets 11/26/2023 176  150 - 400 K/uL Final   nRBC 11/26/2023 0.0  0.0 - 0.2 % Final   Neutrophils Relative % 11/26/2023 59  % Final   Neutro Abs 11/26/2023 4.8  1.7 - 7.7 K/uL Final   Lymphocytes Relative 11/26/2023 30  % Final   Lymphs Abs 11/26/2023 2.4  0.7 - 4.0 K/uL Final   Monocytes Relative 11/26/2023 9  % Final   Monocytes Absolute 11/26/2023 0.7  0.1 - 1.0 K/uL Final   Eosinophils Relative 11/26/2023 1  % Final   Eosinophils Absolute 11/26/2023 0.0  0.0 - 0.5 K/uL Final   Basophils Relative 11/26/2023 0  % Final   Basophils Absolute 11/26/2023 0.0  0.0 - 0.1 K/uL Final   Immature Granulocytes 11/26/2023 1  % Final   Abs Immature Granulocytes 11/26/2023 0.06  0.00 - 0.07 K/uL Final   Performed at [facility], [address] [place], [place] [address]   Sodium 11/26/2023 141  135 - 145 mmol/L Final   Potassium 11/26/2023 3.6  3.5 - 5.1 mmol/L Final   Chloride 11/26/2023 102  98 - 111 mmol/L Final   CO2 11/26/2023 28  22 - 32 mmol/L Final   Glucose, Bld 11/26/2023 105 (H)  70 - 99  mg/dL Final   Glucose reference range applies only to samples taken after fasting for at least 8 hours.   BUN 11/26/2023 13  6 - 20 mg/dL Final   Creatinine, Ser 11/26/2023 0.62  0.44 - 1.00 mg/dL Final   Calcium  11/26/2023 8.4 (L)  8.9 - 10.3 mg/dL Final   Total Protein 87/76/7975 6.4 (L)  6.5 - 8.1 g/dL Final   Albumin 87/76/7975 3.5  3.5 - 5.0 g/dL Final   AST 87/76/7975 120 (H)  15 - 41 U/L Final   ALT 11/26/2023 64 (H)  0 - 44 U/L Final   Alkaline Phosphatase 11/26/2023 123  38 - 126 U/L Final   Total Bilirubin 11/26/2023 0.5  <1.2 mg/dL Final   GFR, Estimated 11/26/2023 >60  >60 mL/min Final   Comment: (NOTE) Calculated using the CKD-EPI Creatinine Equation (2021)    Anion gap 11/26/2023 11  5 - 15 Final   Performed at [facility], [address] [place], [place] [address]   B Natriuretic Peptide 11/26/2023 14.9  0.0 - 100.0 pg/mL Final   Performed at [facility], [address] [place], [place] [address]   Troponin I (High Sensitivity) 11/26/2023 5  <18 ng/L Final   Comment: (NOTE) Elevated high sensitivity troponin I (hsTnI) values and significant  changes across serial measurements may suggest ACS but many other  chronic and acute conditions are known to elevate hsTnI results.  Refer to the Links section for chest pain algorithms and additional  guidance. Performed at [facility], [address] [place], [place] [address]    Troponin I (High Sensitivity) 11/26/2023 6  <18 ng/L Final   Comment: (NOTE) Elevated high sensitivity troponin I (hsTnI) values and significant  changes across serial measurements may suggest ACS but many other  chronic and acute conditions are known to elevate hsTnI results.  Refer to the Links section for chest pain algorithms and additional  guidance. Performed at [facility],  [address] [place], [place] [address]    TSH 11/26/2023 1.265  0.350 - 4.500 uIU/mL Final   Comment: Performed by  a 3rd Generation assay with a functional sensitivity of <=0.01 uIU/mL. Performed at [facility], [address] [place], [place] [address]    WBC 11/28/2023 11.8 (H)  4.0 - 10.5 K/uL Final   RBC 11/28/2023 4.25  3.87 - 5.11 MIL/uL Final   Hemoglobin 11/28/2023 14.1  12.0 - 15.0 g/dL Final   HCT 87/74/7975 43.0  36.0 - 46.0 % Final   MCV 11/28/2023 101.2 (H)  80.0 - 100.0 fL Final   MCH 11/28/2023 33.2  26.0 - 34.0 pg Final   MCHC 11/28/2023 32.8  30.0 - 36.0 g/dL Final   RDW 87/74/7975 14.5  11.5 - 15.5 % Final   Platelets 11/28/2023 145 (L)  150 - 400 K/uL Final   nRBC 11/28/2023 0.0  0.0 - 0.2 % Final   Neutrophils Relative % 11/28/2023 78  % Final   Neutro Abs 11/28/2023 9.2 (H)  1.7 - 7.7 K/uL Final   Lymphocytes Relative 11/28/2023 14  % Final   Lymphs Abs 11/28/2023 1.6  0.7 - 4.0 K/uL Final   Monocytes Relative 11/28/2023 7  % Final   Monocytes Absolute 11/28/2023 0.8  0.1 - 1.0 K/uL Final   Eosinophils Relative 11/28/2023 0  % Final   Eosinophils Absolute 11/28/2023 0.0  0.0 - 0.5 K/uL Final   Basophils Relative 11/28/2023 0  % Final   Basophils Absolute 11/28/2023 0.0  0.0 - 0.1 K/uL Final   Immature Granulocytes 11/28/2023 1  % Final   Abs Immature Granulocytes 11/28/2023 0.14 (H)  0.00 - 0.07 K/uL Final   Performed at [facility], [address] [place], [place] [address]   Sodium 11/28/2023 138  135 - 145 mmol/L Final   Potassium 11/28/2023 3.8  3.5 - 5.1 mmol/L Final   Chloride 11/28/2023 103  98 - 111 mmol/L Final   CO2 11/28/2023 25  22 - 32 mmol/L Final   Glucose, Bld 11/28/2023 108 (H)  70 - 99 mg/dL Final   Glucose reference range applies only to samples taken after fasting for at least 8 hours.   BUN 11/28/2023 25 (H)  6 - 20 mg/dL Final   Creatinine, Ser 11/28/2023 0.70  0.44 - 1.00 mg/dL Final   Calcium  11/28/2023 8.7 (L)  8.9 - 10.3 mg/dL Final   GFR, Estimated 11/28/2023 >60  >60 mL/min Final   Comment: (NOTE) Calculated using the  CKD-EPI Creatinine Equation (2021)    Anion gap 11/28/2023 10  5 - 15 Final   Performed at [facility], [address] [place], [place] [address]  Admission on 11/18/2023, Discharged on 11/18/2023  Component Date Value Ref Range Status   WBC 11/18/2023 5.3  4.0 - 10.5 K/uL Final   RBC 11/18/2023 4.42  3.87 - 5.11 MIL/uL Final   Hemoglobin 11/18/2023 14.3  12.0 - 15.0 g/dL Final   HCT 87/84/7975 42.7  36.0 - 46.0 % Final   MCV 11/18/2023 96.6  80.0 - 100.0 fL Final   MCH 11/18/2023 32.4  26.0 - 34.0 pg Final   MCHC 11/18/2023 33.5  30.0 - 36.0 g/dL Final   RDW 87/84/7975 13.8  11.5 - 15.5 % Final   Platelets 11/18/2023 227  150 - 400 K/uL Final   nRBC 11/18/2023 0.0  0.0 - 0.2 % Final   Neutrophils Relative % 11/18/2023 55  % Final  Neutro Abs 11/18/2023 2.9  1.7 - 7.7 K/uL Final   Lymphocytes Relative 11/18/2023 38  % Final   Lymphs Abs 11/18/2023 2.0  0.7 - 4.0 K/uL Final   Monocytes Relative 11/18/2023 5  % Final   Monocytes Absolute 11/18/2023 0.3  0.1 - 1.0 K/uL Final   Eosinophils Relative 11/18/2023 1  % Final   Eosinophils Absolute 11/18/2023 0.1  0.0 - 0.5 K/uL Final   Basophils Relative 11/18/2023 1  % Final   Basophils Absolute 11/18/2023 0.0  0.0 - 0.1 K/uL Final   Immature Granulocytes 11/18/2023 0  % Final   Abs Immature Granulocytes 11/18/2023 0.01  0.00 - 0.07 K/uL Final   Performed at [facility], [address], [place], [place] [address]   Sodium 11/18/2023 138  135 - 145 mmol/L Final   Potassium 11/18/2023 2.7 (LL)  3.5 - 5.1 mmol/L Final   Comment: CRITICAL RESULT CALLED TO, READ BACK BY AND VERIFIED WITH HARRIS, S RN AT 1602 ON 11/18/2023 BY PRUDY, K    Chloride 11/18/2023 106  98 - 111 mmol/L Final   CO2 11/18/2023 23  22 - 32 mmol/L Final   Glucose, Bld 11/18/2023 134 (H)  70 - 99 mg/dL Final   Glucose reference range applies only to samples taken after fasting for at least 8 hours.   BUN 11/18/2023 9  6 - 20 mg/dL Final    Creatinine, Ser 11/18/2023 0.63  0.44 - 1.00 mg/dL Final   Calcium  11/18/2023 8.1 (L)  8.9 - 10.3 mg/dL Final   Total Protein 87/84/7975 6.2 (L)  6.5 - 8.1 g/dL Final   Albumin 87/84/7975 3.5  3.5 - 5.0 g/dL Final   AST 87/84/7975 26  15 - 41 U/L Final   ALT 11/18/2023 22  0 - 44 U/L Final   Alkaline Phosphatase 11/18/2023 111  38 - 126 U/L Final   Total Bilirubin 11/18/2023 0.6  <1.2 mg/dL Final   GFR, Estimated 11/18/2023 >60  >60 mL/min Final   Comment: (NOTE) Calculated using the CKD-EPI Creatinine Equation (2021)    Anion gap 11/18/2023 9  5 - 15 Final   Performed at [facility], [address], [place], [place] [address]   SARS Coronavirus 2 by RT PCR 11/18/2023 POSITIVE (A)  NEGATIVE Final   Comment: (NOTE) SARS-CoV-2 target nucleic acids are DETECTED.  The SARS-CoV-2 RNA is generally detectable in upper respiratory specimens during the acute phase of infection. Positive results are indicative of the presence of the identified virus, but do not rule out bacterial infection or co-infection with other pathogens not detected by the test. Clinical correlation with patient history and other diagnostic information is necessary to determine patient infection status. The expected result is Negative.  Fact Sheet for Patients: bloggercourse.com  Fact Sheet for Healthcare Providers: seriousbroker.it  This test is not yet approved or cleared by the [place]  FDA and  has been authorized for detection and/or diagnosis of SARS-CoV-2 by FDA under an Emergency Use Authorization (EUA).  This EUA will remain in effect (meaning this test can be used) for the duration of  the COVID-19 declaration under Section 564(b)(1) of the A                          ct, 21 U.S.C. section 360bbb-3(b)(1), unless the authorization is terminated or revoked sooner.     Influenza A by PCR 11/18/2023 NEGATIVE  NEGATIVE Final    Influenza  B by PCR 11/18/2023 NEGATIVE  NEGATIVE Final   Comment: (NOTE) The Xpert Xpress SARS-CoV-2/FLU/RSV plus assay is intended as an aid in the diagnosis of influenza from Nasopharyngeal swab specimens and should not be used as a sole basis for treatment. Nasal washings and aspirates are unacceptable for Xpert Xpress SARS-CoV-2/FLU/RSV testing.  Fact Sheet for Patients: bloggercourse.com  Fact Sheet for Healthcare Providers: seriousbroker.it  This test is not yet approved or cleared by the [place]  FDA and has been authorized for detection and/or diagnosis of SARS-CoV-2 by FDA under an Emergency Use Authorization (EUA). This EUA will remain in effect (meaning this test can be used) for the duration of the COVID-19 declaration under Section 564(b)(1) of the Act, 21 U.S.C. section 360bbb-3(b)(1), unless the authorization is terminated or revoked.     Resp Syncytial Virus by PCR 11/18/2023 NEGATIVE  NEGATIVE Final   Comment: (NOTE) Fact Sheet for Patients: bloggercourse.com  Fact Sheet for Healthcare Providers: seriousbroker.it  This test is not yet approved or cleared by the [place]  FDA and has been authorized for detection and/or diagnosis of SARS-CoV-2 by FDA under an Emergency Use Authorization (EUA). This EUA will remain in effect (meaning this test can be used) for the duration of the COVID-19 declaration under Section 564(b)(1) of the Act, 21 U.S.C. section 360bbb-3(b)(1), unless the authorization is terminated or revoked.  Performed at [facility], [address], [place], [place] [address]    Alcohol, Ethyl (B) 11/18/2023 186 (H)  <10 mg/dL Final   Comment: (NOTE) Lowest detectable limit for serum alcohol is 10 mg/dL.  For medical purposes only. Performed at [facility], [address], [place], [place]  [address]   Admission on 10/24/2023, Discharged on 10/26/2023  Component Date Value Ref Range Status   Opiates 10/25/2023 NONE DETECTED  NONE DETECTED Final   Cocaine 10/25/2023 NONE DETECTED  NONE DETECTED Final   Benzodiazepines 10/25/2023 POSITIVE (A)  NONE DETECTED Final   Amphetamines 10/25/2023 NONE DETECTED  NONE DETECTED Final   Tetrahydrocannabinol 10/25/2023 NONE DETECTED  NONE DETECTED Final   Barbiturates 10/25/2023 NONE DETECTED  NONE DETECTED Final   Comment: (NOTE) DRUG SCREEN FOR MEDICAL PURPOSES ONLY.  IF CONFIRMATION IS NEEDED FOR ANY PURPOSE, NOTIFY LAB WITHIN 5 DAYS.  LOWEST DETECTABLE LIMITS FOR URINE DRUG SCREEN Drug Class                     Cutoff (ng/mL) Amphetamine  and metabolites    1000 Barbiturate and metabolites    200 Benzodiazepine                 200 Opiates and metabolites        300 Cocaine and metabolites        300 THC                            50 Performed at [facility], [address] [place], [place] [address]    TSH 10/25/2023 0.454  0.350 - 4.500 uIU/mL Final   Comment: Performed by a 3rd Generation assay with a functional sensitivity of <=0.01 uIU/mL. Performed at [facility], [address] [place], [place] [address]   Admission on 10/23/2023, Discharged on 10/23/2023  Component Date Value Ref Range Status   WBC 10/23/2023 6.0  4.0 - 10.5 K/uL Final   RBC 10/23/2023 4.87  3.87 - 5.11 MIL/uL Final   Hemoglobin 10/23/2023  15.3 (H)  12.0 - 15.0 g/dL Final   HCT 88/80/7975 46.8 (H)  36.0 - 46.0 % Final   MCV 10/23/2023 96.1  80.0 - 100.0 fL Final   MCH 10/23/2023 31.4  26.0 - 34.0 pg Final   MCHC 10/23/2023 32.7  30.0 - 36.0 g/dL Final   RDW 88/80/7975 14.5  11.5 - 15.5 % Final   Platelets 10/23/2023 243  150 - 400 K/uL Final   nRBC 10/23/2023 0.0  0.0 - 0.2 % Final   Neutrophils Relative % 10/23/2023 43  % Final   Neutro Abs 10/23/2023 2.6  1.7 - 7.7 K/uL Final   Lymphocytes Relative 10/23/2023 47  % Final   Lymphs  Abs 10/23/2023 2.8  0.7 - 4.0 K/uL Final   Monocytes Relative 10/23/2023 6  % Final   Monocytes Absolute 10/23/2023 0.4  0.1 - 1.0 K/uL Final   Eosinophils Relative 10/23/2023 3  % Final   Eosinophils Absolute 10/23/2023 0.2  0.0 - 0.5 K/uL Final   Basophils Relative 10/23/2023 1  % Final   Basophils Absolute 10/23/2023 0.1  0.0 - 0.1 K/uL Final   Immature Granulocytes 10/23/2023 0  % Final   Abs Immature Granulocytes 10/23/2023 0.01  0.00 - 0.07 K/uL Final   Performed at [facility], [address], [place], [place] [address]   Sodium 10/23/2023 144  135 - 145 mmol/L Final   Potassium 10/23/2023 4.2  3.5 - 5.1 mmol/L Final   Chloride 10/23/2023 105  98 - 111 mmol/L Final   CO2 10/23/2023 26  22 - 32 mmol/L Final   Glucose, Bld 10/23/2023 65 (L)  70 - 99 mg/dL Final   Glucose reference range applies only to samples taken after fasting for at least 8 hours.   BUN 10/23/2023 8  6 - 20 mg/dL Final   Creatinine, Ser 10/23/2023 0.71  0.44 - 1.00 mg/dL Final   Calcium  10/23/2023 8.8 (L)  8.9 - 10.3 mg/dL Final   Total Protein 88/80/7975 7.4  6.5 - 8.1 g/dL Final   Albumin 88/80/7975 4.2  3.5 - 5.0 g/dL Final   AST 88/80/7975 39  15 - 41 U/L Final   ALT 10/23/2023 22  0 - 44 U/L Final   Alkaline Phosphatase 10/23/2023 136 (H)  38 - 126 U/L Final   Total Bilirubin 10/23/2023 0.5  <1.2 mg/dL Final   GFR, Estimated 10/23/2023 >60  >60 mL/min Final   Comment: (NOTE) Calculated using the CKD-EPI Creatinine Equation (2021)    Anion gap 10/23/2023 13  5 - 15 Final   Performed at [facility], [address], [place], [place] [address]   Hgb A1c MFr Bld 10/23/2023 5.2  4.8 - 5.6 % Final   Comment: (NOTE) Pre diabetes:          5.7%-6.4%  Diabetes:              >6.4%  Glycemic control for   <7.0% adults with diabetes    Mean Plasma Glucose 10/23/2023 102.54  mg/dL Final   Performed at [facility], [address] [place], [place] [address]   Magnesium  10/23/2023 2.1  1.7  - 2.4 mg/dL Final   Performed at [facility], [address], [place], [place] [address]   Alcohol, Ethyl (B) 10/23/2023 255 (H)  <10 mg/dL Final   Comment: (NOTE) Lowest detectable limit for serum alcohol is 10 mg/dL.  For medical purposes only. Performed at [facility],  [address] [place], [place] [address]    Cholesterol 10/23/2023 158  0 - 200 mg/dL Final   Triglycerides 88/80/7975 214 (H)  <150 mg/dL Final   HDL 88/80/7975 105  >40 mg/dL Final   Total CHOL/HDL Ratio 10/23/2023 1.5  RATIO Final   VLDL 10/23/2023 43 (H)  0 - 40 mg/dL Final   LDL Cholesterol 10/23/2023 10  0 - 99 mg/dL Final   Comment:        Total Cholesterol/HDL:CHD Risk Coronary Heart Disease Risk Table                     Men   Women  1/2 Average Risk   3.4   3.3  Average Risk       5.0   4.4  2 X Average Risk   9.6   7.1  3 X Average Risk  23.4   11.0        Use the calculated Patient Ratio above and the CHD Risk Table to determine the patient's CHD Risk.        ATP III CLASSIFICATION (LDL):  <100     mg/dL   Optimal  899-870  mg/dL   Near or Above                    Optimal  130-159  mg/dL   Borderline  839-810  mg/dL   High  >809     mg/dL   Very High Performed at [facility], [address] [place], [place] [address]    TSH 10/23/2023 0.306 (L)  0.350 - 4.500 uIU/mL Final   Comment: Performed by a 3rd Generation assay with a functional sensitivity of <=0.01 uIU/mL. Performed at [facility], [address] [place], [place] [address]    Prolactin 10/23/2023 9.7  3.6 - 25.2 ng/mL Final   Comment: (NOTE) Performed At: [facility] [address] [place], [place] [id number] Jennette Shorter MD Ey:[id number]    Glucose-Capillary 10/23/2023 89  70 - 99 mg/dL Final   Glucose reference range applies only to samples taken after fasting for at least 8 hours.  Admission on 08/20/2023, Discharged on 08/21/2023  Component Date Value Ref Range Status   Sodium  08/20/2023 138  135 - 145 mmol/L Final   Potassium 08/20/2023 3.8  3.5 - 5.1 mmol/L Final   Chloride 08/20/2023 106  98 - 111 mmol/L Final   CO2 08/20/2023 22  22 - 32 mmol/L Final   Glucose, Bld 08/20/2023 83  70 - 99 mg/dL Final   Glucose reference range applies only to samples taken after fasting for at least 8 hours.   BUN 08/20/2023 12  6 - 20 mg/dL Final   Creatinine, Ser 08/20/2023 0.60  0.44 - 1.00 mg/dL Final   Calcium  08/20/2023 8.3 (L)  8.9 - 10.3 mg/dL Final   Total Protein 90/83/7975 7.4  6.5 - 8.1 g/dL Final   Albumin 90/83/7975 3.5  3.5 - 5.0 g/dL Final   AST 90/83/7975 23  15 - 41 U/L Final   ALT 08/20/2023 23  0 - 44 U/L Final   Alkaline Phosphatase 08/20/2023 130 (H)  38 - 126 U/L Final   Total Bilirubin 08/20/2023 0.3  0.3 - 1.2 mg/dL Final   GFR, Estimated 08/20/2023 >60  >60 mL/min Final   Comment: (NOTE) Calculated using the CKD-EPI Creatinine Equation (2021)    Anion gap 08/20/2023 10  5 - 15 Final  Performed at [facility], [address], [place], [place] [address]   Alcohol, Ethyl (B) 08/20/2023 214 (H)  <10 mg/dL Final   Comment: (NOTE) Lowest detectable limit for serum alcohol is 10 mg/dL.  For medical purposes only. Performed at [facility], [address], [place], [place] [address]    Salicylate Lvl 08/20/2023 17.8  7.0 - 30.0 mg/dL Final   Performed at [facility], [address] [place], [place] [address]   Acetaminophen  (Tylenol ), Serum 08/20/2023 <10 (L)  10 - 30 ug/mL Final   Comment: (NOTE) Therapeutic concentrations vary significantly. A range of 10-30 ug/mL  may be an effective concentration for many patients. However, some  are best treated at concentrations outside of this range. Acetaminophen  concentrations >150 ug/mL at 4 hours after ingestion  and >50 ug/mL at 12 hours after ingestion are often associated with  toxic reactions.  Performed at [facility],  [address], [place], [place] [address]    WBC 08/20/2023 6.4  4.0 - 10.5 K/uL Final   RBC 08/20/2023 4.26  3.87 - 5.11 MIL/uL Final   Hemoglobin 08/20/2023 12.9  12.0 - 15.0 g/dL Final   HCT 90/83/7975 40.7  36.0 - 46.0 % Final   MCV 08/20/2023 95.5  80.0 - 100.0 fL Final   MCH 08/20/2023 30.3  26.0 - 34.0 pg Final   MCHC 08/20/2023 31.7  30.0 - 36.0 g/dL Final   RDW 90/83/7975 12.8  11.5 - 15.5 % Final   Platelets 08/20/2023 332  150 - 400 K/uL Final   nRBC 08/20/2023 0.0  0.0 - 0.2 % Final   Performed at [facility], [address] [place], [place] [address]   Opiates 08/20/2023 NONE DETECTED  NONE DETECTED Final   Cocaine 08/20/2023 NONE DETECTED  NONE DETECTED Final   Benzodiazepines 08/20/2023 NONE DETECTED  NONE DETECTED Final   Amphetamines 08/20/2023 NONE DETECTED  NONE DETECTED Final   Tetrahydrocannabinol 08/20/2023 NONE DETECTED  NONE DETECTED Final   Barbiturates 08/20/2023 NONE DETECTED  NONE DETECTED Final   Comment: (NOTE) DRUG SCREEN FOR MEDICAL PURPOSES ONLY.  IF CONFIRMATION IS NEEDED FOR ANY PURPOSE, NOTIFY LAB WITHIN 5 DAYS.  LOWEST DETECTABLE LIMITS FOR URINE DRUG SCREEN Drug Class                     Cutoff (ng/mL) Amphetamine  and metabolites    1000 Barbiturate and metabolites    200 Benzodiazepine                 200 Opiates and metabolites        300 Cocaine and metabolites        300 THC                            50 Performed at [facility], [address] [place], [place] [address]    SARS Coronavirus 2 by RT PCR 08/21/2023 NEGATIVE  NEGATIVE Final   Comment: (NOTE) SARS-CoV-2 target nucleic acids are NOT DETECTED.  The SARS-CoV-2 RNA is generally detectable in upper and lower respiratory specimens during the acute phase of infection. The lowest concentration of SARS-CoV-2 viral copies this assay can detect is 250 copies / mL. A negative result does not preclude SARS-CoV-2 infection and should not  be used as the sole basis for treatment or other patient management decisions.  A negative result may occur  with improper specimen collection / handling, submission of specimen other than nasopharyngeal swab, presence of viral mutation(s) within the areas targeted by this assay, and inadequate number of viral copies (<250 copies / mL). A negative result must be combined with clinical observations, patient history, and epidemiological information.  Fact Sheet for Patients:   roadlaptop.co.za  Fact Sheet for Healthcare Providers: http://kim-miller.com/  This test is not yet approved or                           cleared by the [place]  FDA and has been authorized for detection and/or diagnosis of SARS-CoV-2 by FDA under an Emergency Use Authorization (EUA).  This EUA will remain in effect (meaning this test can be used) for the duration of the COVID-19 declaration under Section 564(b)(1) of the Act, 21 U.S.C. section 360bbb-3(b)(1), unless the authorization is terminated or revoked sooner.  Performed at [facility], [address], [place], [place] [address]     Allergies: Codeine and Hydrocodone   Medications:  Facility Ordered Medications  Medication   acetaminophen  (TYLENOL ) tablet 650 mg   alum & mag hydroxide-simeth (MAALOX/MYLANTA) 200-200-20 MG/5ML suspension 30 mL   magnesium  hydroxide (MILK OF MAGNESIA) suspension 30 mL   [COMPLETED] thiamine  (VITAMIN B1) injection 100 mg   thiamine  (VITAMIN B1) tablet 100 mg   multivitamin with minerals tablet 1 tablet   LORazepam  (ATIVAN ) tablet 1 mg   hydrOXYzine  (ATARAX ) tablet 25 mg   loperamide  (IMODIUM ) capsule 2-4 mg   ondansetron  (ZOFRAN -ODT) disintegrating tablet 4 mg   LORazepam  (ATIVAN ) tablet 1 mg   Followed by   LORazepam  (ATIVAN ) tablet 1 mg   Followed by   NOREEN ON 01/12/2024] LORazepam  (ATIVAN ) tablet 1 mg   Followed by   NOREEN ON 01/14/2024]  LORazepam  (ATIVAN ) tablet 1 mg   OLANZapine  zydis (ZYPREXA ) disintegrating tablet 5 mg   OLANZapine  (ZYPREXA ) injection 5 mg   OLANZapine  (ZYPREXA ) injection 10 mg   PTA Medications  Medication Sig   albuterol  (ACCUNEB ) 1.25 MG/3ML nebulizer solution Take 1 ampule by nebulization 3 (three) times daily as needed for wheezing or shortness of breath.   Turmeric (QC TUMERIC COMPLEX PO) Take 2 capsules by mouth daily. (Patient not taking: Reported on 11/26/2023)   SUMAtriptan  (IMITREX ) 100 MG tablet Take 1 tablet (100 mg total) by mouth every 2 (two) hours as needed for migraine. May repeat in 2 hours if headache persists or recurs. Do not exceed 2 tablets within a 24 hour period.   fluticasone  (FLONASE ) 50 MCG/ACT nasal spray Place 2 sprays into both nostrils daily.   ANORO ELLIPTA  62.5-25 MCG/ACT AEPB Inhale 1 puff into the lungs daily.   triamcinolone  cream (KENALOG ) 0.5 % Apply topically 2 (two) times daily. (Patient not taking: Reported on 11/26/2023)   DUPIXENT 300 MG/2ML SOAJ Inject 300 mg into the skin every 14 (fourteen) days.   montelukast  (SINGULAIR ) 10 MG tablet Take 10 mg by mouth daily.   progesterone  (PROMETRIUM ) 100 MG capsule Take 200 mg by mouth at bedtime.   albuterol  (VENTOLIN  HFA) 108 (90 Base) MCG/ACT inhaler Inhale 1-2 puffs into the lungs every 6 (six) hours as needed for wheezing.   acetaminophen  (TYLENOL ) 500 MG tablet Take 1,000 mg by mouth every 6 (six) hours as needed (For pain).   cloNIDine  (CATAPRES ) 0.1 MG tablet Take 0.1 mg by mouth 2 (two) times daily as needed (For anxiety).   folic acid  (FOLVITE ) 1 MG tablet Take  1 mg by mouth daily.   lamoTRIgine  (LAMICTAL ) 100 MG tablet Take 50 mg by mouth 2 (two) times daily.   acamprosate  (CAMPRAL ) 333 MG tablet Take 666 mg by mouth 3 (three) times daily.   busPIRone  (BUSPAR ) 15 MG tablet Take 7.5 mg by mouth 2 (two) times daily.   thiamine  (VITAMIN B-1) 100 MG tablet Take 1 tablet (100 mg total) by mouth daily.    benzonatate  (TESSALON ) 100 MG capsule Take 1 capsule (100 mg total) by mouth 2 (two) times daily as needed for cough.    Long Term Goals: Improvement in symptoms so as ready for discharge  Short Term Goals: Patient will verbalize feelings in meetings with treatment team members., Patient will attend at least of 50% of the groups daily., Pt will complete the PHQ9 on admission, day 3 and discharge., Patient will participate in completing the [place] Suicide Severity Rating Scale, Patient will score a low risk of violence for 24 hours prior to discharge, and Patient will take medications as prescribed daily.  Medical Decision Making  Inpatient [facility]    Recommendations  Based on my evaluation the patient does not appear to have an emergency medical condition.  Gaither Pouch, NP 01/11/24  5:38 AM

## 2024-01-11 ENCOUNTER — Encounter (HOSPITAL_COMMUNITY): Payer: Self-pay | Admitting: Psychiatry

## 2024-01-11 DIAGNOSIS — F10129 Alcohol abuse with intoxication, unspecified: Secondary | ICD-10-CM | POA: Diagnosis not present

## 2024-01-11 DIAGNOSIS — F339 Major depressive disorder, recurrent, unspecified: Secondary | ICD-10-CM | POA: Diagnosis not present

## 2024-01-11 DIAGNOSIS — R4589 Other symptoms and signs involving emotional state: Secondary | ICD-10-CM | POA: Diagnosis not present

## 2024-01-11 LAB — COMPREHENSIVE METABOLIC PANEL
ALT: 56 U/L — ABNORMAL HIGH (ref 0–44)
AST: 136 U/L — ABNORMAL HIGH (ref 15–41)
Albumin: 3.7 g/dL (ref 3.5–5.0)
Alkaline Phosphatase: 171 U/L — ABNORMAL HIGH (ref 38–126)
Anion gap: 13 (ref 5–15)
BUN: <5 mg/dL — ABNORMAL LOW (ref 6–20)
CO2: 27 mmol/L (ref 22–32)
Calcium: 8.8 mg/dL — ABNORMAL LOW (ref 8.9–10.3)
Chloride: 101 mmol/L (ref 98–111)
Creatinine, Ser: 0.78 mg/dL (ref 0.44–1.00)
GFR, Estimated: >60 mL/min (ref >60)
Glucose, Bld: 113 mg/dL — ABNORMAL HIGH (ref 70–99)
Potassium: 3.6 mmol/L (ref 3.5–5.1)
Sodium: 141 mmol/L (ref 135–145)
Total Bilirubin: 0.7 mg/dL (ref 0.0–1.2)
Total Protein: 6.2 g/dL — ABNORMAL LOW (ref 6.5–8.1)

## 2024-01-11 LAB — TSH: TSH: 0.786 u[IU]/mL (ref 0.350–4.500)

## 2024-01-11 LAB — CBC WITH DIFFERENTIAL/PLATELET
Abs Immature Granulocytes: 0.02 10*3/uL (ref 0.00–0.07)
Basophils Absolute: 0.1 10*3/uL (ref 0.0–0.1)
Basophils Relative: 1 %
Eosinophils Absolute: 0.1 10*3/uL (ref 0.0–0.5)
Eosinophils Relative: 2 %
HCT: 43.8 % (ref 36.0–46.0)
Hemoglobin: 14.8 g/dL (ref 12.0–15.0)
Immature Granulocytes: 0 %
Lymphocytes Relative: 47 %
Lymphs Abs: 3.8 10*3/uL (ref 0.7–4.0)
MCH: 33.7 pg (ref 26.0–34.0)
MCHC: 33.8 g/dL (ref 30.0–36.0)
MCV: 99.8 fL (ref 80.0–100.0)
Monocytes Absolute: 0.6 10*3/uL (ref 0.1–1.0)
Monocytes Relative: 8 %
Neutro Abs: 3.4 10*3/uL (ref 1.7–7.7)
Neutrophils Relative %: 42 %
Platelets: 199 10*3/uL (ref 150–400)
RBC: 4.39 MIL/uL (ref 3.87–5.11)
RDW: 14.6 % (ref 11.5–15.5)
WBC: 8.1 10*3/uL (ref 4.0–10.5)
nRBC: 0 % (ref 0.0–0.2)

## 2024-01-11 LAB — HEPATITIS PANEL, ACUTE
HCV Ab: NONREACTIVE
Hep A IgM: NONREACTIVE
Hep B C IgM: NONREACTIVE
Hepatitis B Surface Ag: NONREACTIVE

## 2024-01-11 LAB — ETHANOL: Alcohol, Ethyl (B): 329 mg/dL (ref <10)

## 2024-01-11 LAB — LIPID PANEL
Cholesterol: 147 mg/dL (ref 0–200)
HDL: 79 mg/dL (ref >40)
LDL Cholesterol: UNDETERMINED mg/dL (ref 0–99)
Total CHOL/HDL Ratio: 1.9 {ratio}
Triglycerides: 494 mg/dL — ABNORMAL HIGH (ref <150)
VLDL: UNDETERMINED mg/dL (ref 0–40)

## 2024-01-11 LAB — T4, FREE: Free T4: 0.59 ng/dL — ABNORMAL LOW (ref 0.61–1.12)

## 2024-01-11 LAB — LDL CHOLESTEROL, DIRECT: Direct LDL: 43 mg/dL (ref 0–99)

## 2024-01-11 MED ORDER — BUSPIRONE HCL 15 MG PO TABS
7.5000 mg | ORAL_TABLET | Freq: Two times a day (BID) | ORAL | Status: DC
  Administered 2024-01-11 – 2024-01-12 (×3): 7.5 mg via ORAL
  Filled 2024-01-11 (×3): qty 1

## 2024-01-11 MED ORDER — CLONIDINE HCL 0.1 MG PO TABS
0.1000 mg | ORAL_TABLET | Freq: Two times a day (BID) | ORAL | Status: DC | PRN
  Administered 2024-01-12: 0.1 mg via ORAL
  Filled 2024-01-11: qty 1

## 2024-01-11 MED ORDER — SUMATRIPTAN SUCCINATE 50 MG PO TABS
100.0000 mg | ORAL_TABLET | Freq: Once | ORAL | Status: AC
  Administered 2024-01-11: 100 mg via ORAL
  Filled 2024-01-11: qty 2

## 2024-01-11 MED ORDER — FLUOXETINE HCL 20 MG PO CAPS
20.0000 mg | ORAL_CAPSULE | Freq: Every day | ORAL | Status: DC
  Administered 2024-01-11 – 2024-01-12 (×2): 20 mg via ORAL
  Filled 2024-01-11 (×2): qty 1

## 2024-01-11 MED ORDER — FLUTICASONE FUROATE-VILANTEROL 100-25 MCG/ACT IN AEPB: 1.0000 | INHALATION_SPRAY | Freq: Every day | RESPIRATORY_TRACT | Status: DC

## 2024-01-11 MED ORDER — UMECLIDINIUM BROMIDE 62.5 MCG/ACT IN AEPB: 1.0000 | INHALATION_SPRAY | Freq: Every day | RESPIRATORY_TRACT | Status: DC

## 2024-01-11 MED ORDER — HYDROXYZINE HCL 25 MG PO TABS
25.0000 mg | ORAL_TABLET | Freq: Four times a day (QID) | ORAL | Status: DC | PRN
  Administered 2024-01-11: 25 mg via ORAL
  Filled 2024-01-11: qty 1

## 2024-01-11 MED ORDER — BUDESONIDE 0.5 MG/2ML IN SUSP
0.5000 mg | Freq: Every day | RESPIRATORY_TRACT | Status: DC
  Filled 2024-01-11 (×2): qty 2

## 2024-01-11 MED ORDER — NICOTINE 14 MG/24HR TD PT24: 14.0000 mg | MEDICATED_PATCH | Freq: Every day | TRANSDERMAL | Status: DC | PRN

## 2024-01-11 MED ORDER — ALBUTEROL SULFATE HFA 108 (90 BASE) MCG/ACT IN AERS
1.0000 | INHALATION_SPRAY | Freq: Four times a day (QID) | RESPIRATORY_TRACT | Status: DC | PRN
  Administered 2024-01-11: 1 via RESPIRATORY_TRACT
  Administered 2024-01-11 – 2024-01-12 (×2): 2 via RESPIRATORY_TRACT
  Filled 2024-01-11: qty 6.7

## 2024-01-11 NOTE — ED Notes (Signed)
 Pt sleeping in no acute distress. RR even and unlabored. Environment secured. Will continue to monitor for safety.

## 2024-01-11 NOTE — BH Assessment (Signed)
 Comprehensive Clinical Assessment (CCA) Screening, Triage and Referral Note  01/11/2024 [name] [id number]  Disposition: Gaither Pouch, NP recommends pt to be admitted to Facility Based Crisis.   The patient demonstrates the following risk factors for suicide: Chronic risk factors for suicide include: substance use disorder. Acute risk factors for suicide include: N/A. Protective factors for this patient include: positive social support. Considering these factors, the overall suicide risk at this point appears to be no risk. Patient is not appropriate for outpatient follow up.  [name] is a 58 year old female who presents voluntary and by her boyfriend [name], [phone]) to [facility]). Pt consented for her boyfriend to be present during the assessment. Clinician asked the pt, what brought you to the hospital? Pt's boyfriend reports, the pt is very depressed, she needs medications however she's not sure of the names of her medications. Pt reports, she's not seen her psychiatrist in months. Pt reports, drinking a lot of bootleggers. Pt denies, SI, HI, hallucinations, self-injurious behaviors and access to weapons.    Pt reports, drinking daily. Pt's BAL is pending. Pt's UDS is negative. Pt is linked to Dr. Shirline for medication management. Per chart, pt has previous admission to [facility] at the Facility Based Crisis in November 2024.   Pt presents irritable at times in casual attire and normal speech. Pt's appeared to be staggering and walking with the help of her boyfriend. Pt's mood, affect was depressed. Pt's insight was fair. Pt's judgement is impaired.   Chief Complaint: No chief complaint on file.  Visit Diagnosis: Alcohol use Disorder, severe.   Patient Reported Information How did you hear about us ? Family/Friend  What Is the Reason for Your Visit/Call Today? Pt's boyfriend reports, the pt is very depressed, needs to get  put back on her medications (for anxiety, etc). Pt reports, she's been drinking a lot (amount unknown) of bootleggers. Pt denies, SI, HI, hallucinations, self-injurious behaviors and access to weapons.  How Long Has This Been Causing You Problems? > than 6 months  What Do You Feel Would Help You the Most Today? Stress Management; Treatment for Depression or other mood problem; Alcohol or Drug Use Treatment   Have You Recently Had Any Thoughts About Hurting Yourself? No  Are You Planning to Commit Suicide/Harm Yourself At This time? No   Have you Recently Had Thoughts About Hurting Someone [name]? No  Are You Planning to Harm Someone at This Time? No  Explanation: None.   Have You Used Any Alcohol or Drugs in the Past 24 Hours? Yes  How Long Ago Did You Use Drugs or Alcohol? 01/10/2024. What Did You Use and How Much? Pt reports, drinking a lot of bootleggers prior to arrival.   Do You Currently Have a Therapist/Psychiatrist? Yes  Name of Therapist/Psychiatrist: Pt is linked to Dr. Shirline for medication management but has not seen him in months.   Have You Been Recently Discharged From Any Office Practice or Programs? No  Explanation of Discharge From Practice/Program: N/A    CCA Screening Triage Referral Assessment Type of Contact: Face-to-Face  Telemedicine Service Delivery:   Is this Initial or Reassessment?   Date Telepsych consult ordered in [facility]:    Time Telepsych consult ordered in [facility]:    Location of Assessment: [facility] Assessment Services  Provider Location: [facility] Assessment Services    Collateral Involvement: [name], boyfriend, [phone].   Does Patient Have a Automotive Engineer Guardian?  No. Name and Contact of Legal Guardian: Pt is her own guardian.  If Minor and Not Living with Parent(s), Who has Custody? Pt is an adult.  Is CPS involved or ever been involved? Never  Is APS involved or ever been involved? Never   Patient Determined To Be  At Risk for Harm To Self or Others Based on Review of Patient Reported Information or Presenting Complaint? No  Method: No Plan  Availability of Means: No access or NA  Intent: Vague intent or NA  Notification Required: No need or identified person  Additional Information for Danger to Others Potential: -- (None.)  Additional Comments for Danger to Others Potential: None.  Are There Guns or Other Weapons in Your Home? No  Types of Guns/Weapons: Pt denies, access to weapons.  Are These Weapons Safely Secured?                            -- (Pt denies, access to weapons.)  Who Could Verify You Are Able To Have These Secured: Pt denies, access to weapons.  Do You Have any Outstanding Charges, Pending Court Dates, Parole/Probation? None.  Contacted To Inform of Risk of Harm To Self or Others: Other: Comment (None.)   Does Patient Present under Involuntary Commitment? No    [place] of Residence: [place]   Patient Currently Receiving the Following Services: Not Receiving Services   Determination of Need: Urgent (48 hours)   Options For Referral: Medication Management   Disposition Recommendation per psychiatric provider: We recommend transfer to [facility].  Jackson [name], [facility]   Comprehensive Clinical Assessment (CCA) Note  01/11/2024 [name] [id number]  Chief Complaint: No chief complaint on file.  Visit Diagnosis:     CCA Screening, Triage and Referral (STR)  Patient Reported Information How did you hear about us ? Family/Friend  What Is the Reason for Your Visit/Call Today? Pt's boyfriend reports, the pt is very depressed, needs to get put back on her medications (for anxiety, etc). Pt reports, she's been drinking a lot (amount unknown) of bootleggers. Pt denies, SI, HI, hallucinations, self-injurious behaviors and access to weapons.  How Long Has This Been Causing You Problems? > than 6 months  What Do You Feel  Would Help You the Most Today? Stress Management; Treatment for Depression or other mood problem; Alcohol or Drug Use Treatment   Have You Recently Had Any Thoughts About Hurting Yourself? No  Are You Planning to Commit Suicide/Harm Yourself At This time? No   Flowsheet Row ED from 01/10/2024 in [facility] Most recent reading at 01/11/2024 12:20 AM ED from 01/10/2024 in [facility] Most recent reading at 01/10/2024 10:42 PM ED to Hosp-Admission (Discharged) from 11/26/2023 in [facility] 1C MEDICAL TELEMETRY Most recent reading at 11/26/2023  2:52 AM  C-SSRS RISK CATEGORY No Risk No Risk No Risk       Have you Recently Had Thoughts About Hurting Someone [name]? No  Are You Planning to Harm Someone at This Time? No  Explanation: None.   Have You Used Any Alcohol or Drugs in the Past 24 Hours? Yes  How Long Ago Did You Use Drugs or Alcohol? No data recorded What Did You Use and How Much? Pt reports, drinking a lot of bootleggers prior to arrival.   Do You Currently Have a Therapist/Psychiatrist? Yes  Name of Therapist/Psychiatrist: Name of Therapist/Psychiatrist: Pt is  linked to Dr. Shirline for medication management but has not seen him in months.   Have You Been Recently Discharged From Any Office Practice or Programs? No  Explanation of Discharge From Practice/Program: N/A     CCA Screening Triage Referral Assessment Type of Contact: Face-to-Face  Telemedicine Service Delivery:   Is this Initial or Reassessment?   Date Telepsych consult ordered in [facility]:    Time Telepsych consult ordered in [facility]:    Location of Assessment: [facility] Assessment Services  Provider Location: [facility] Assessment Services   Collateral Involvement: [name], boyfriend, [phone].   Does Patient Have a Automotive Engineer Guardian? No  Legal Guardian Contact Information: Pt is her own guardian.  Copy of Legal  Guardianship Form: -- (Pt is her own guardian.)  Legal Guardian Notified of Arrival: -- (Pt is her own guardian.)  Legal Guardian Notified of Pending Discharge: -- (Pt is her own guardian.)  If Minor and Not Living with Parent(s), Who has Custody? Pt is an adult.  Is CPS involved or ever been involved? Never  Is APS involved or ever been involved? Never   Patient Determined To Be At Risk for Harm To Self or Others Based on Review of Patient Reported Information or Presenting Complaint? No  Method: No Plan  Availability of Means: No access or NA  Intent: Vague intent or NA  Notification Required: No need or identified person  Additional Information for Danger to Others Potential: -- (None.)  Additional Comments for Danger to Others Potential: None.  Are There Guns or Other Weapons in Your Home? No  Types of Guns/Weapons: Pt denies, access to weapons.  Are These Weapons Safely Secured?                            -- (Pt denies, access to weapons.)  Who Could Verify You Are Able To Have These Secured: Pt denies, access to weapons.  Do You Have any Outstanding Charges, Pending Court Dates, Parole/Probation? None.  Contacted To Inform of Risk of Harm To Self or Others: Other: Comment (None.)    Does Patient Present under Involuntary Commitment? No    [place] of Residence: [place]   Patient Currently Receiving the Following Services: Not Receiving Services   Determination of Need: Urgent (48 hours)   Options For Referral: Medication Management     CCA Biopsychosocial Patient Reported Schizophrenia/Schizoaffective Diagnosis in Past: No   Strengths: Pt has strong supports.   Mental Health Symptoms Depression:  Difficulty Concentrating; Fatigue; Hopelessness; Tearfulness; Worthlessness; Sleep (too much or little); Increase/decrease in appetite (Isolation.)   Duration of Depressive symptoms: Duration of Depressive Symptoms: N/A   Mania:  None   Anxiety:    Worrying; Restlessness; Irritability; Fatigue; Difficulty concentrating   Psychosis:  None   Duration of Psychotic symptoms:    Trauma:  None   Obsessions:  None   Compulsions:  None   Inattention:  None   Hyperactivity/Impulsivity:  Feeling of restlessness; Fidgets with hands/feet   Oppositional/Defiant Behaviors:  None   Emotional Irregularity:  None   Other Mood/Personality Symptoms:  None.    Mental Status Exam Appearance and self-care  Stature:  Average   Weight:  Average weight   Clothing:  Casual   Grooming:  Normal   Cosmetic use:  None   Posture/gait:  Normal   Motor activity:  Restless   Sensorium  Attention:  Normal   Concentration:  Normal   Orientation:  X5   Recall/memory:  Normal   Affect and Mood  Affect:  Depressed   Mood:  Depressed   Relating  Eye contact:  -- (Fair.)   Facial expression:  Responsive   Attitude toward examiner:  Irritable   Thought and Language  Speech flow: Normal   Thought content:  Appropriate to Mood and Circumstances   Preoccupation:  None   Hallucinations:  None   Organization:  Coherent   [employer] of Knowledge:  Fair   Intelligence:  Average   Abstraction:  Functional   Judgement:  Impaired   Reality Testing:  Adequate   Insight:  Fair   Decision Making:  Confused   Social Functioning  Social Maturity:  Isolates; Impulsive   Social Judgement:  Heedless   Stress  Stressors:  Grief/losses; Housing   Coping Ability:  Overwhelmed; Exhausted   Skill Deficits:  Communication; Decision making   Supports:  Friends/Service system     Religion: Religion/Spirituality Are You A Religious Person?: Yes What is Your Religious Affiliation?: Baptist How Might This Affect Treatment?: None.  Leisure/Recreation: Leisure / Recreation Do You Have Hobbies?: No  Exercise/Diet: Exercise/Diet Do You Exercise?: No Have You Gained or Lost A Significant Amount of Weight in  the Past Six Months?: No Do You Follow a Special Diet?: No Do You Have Any Trouble Sleeping?: No   CCA Employment/Education Employment/Work Situation: Employment / Work Situation Employment Situation: Employed Work Stressors: Pt works partime in runner, broadcasting/film/video. Patient's Job has Been Impacted by Current Illness: No Has Patient ever Been in the [employer]?: No  Education: Education Is Patient Currently Attending School?: No Last Grade Completed: 12 Did You Attend College?: Yes What Type of College Degree Do you Have?: [employer] in Nursing. Did You Have An Individualized Education Program (IIEP): No Did You Have Any Difficulty At School?: No Patient's Education Has Been Impacted by Current Illness: No   CCA Family/Childhood History Family and Relationship History: Family history Marital status: Single Does patient have children?: Yes How many children?: 2 How is patient's relationship with their children?: Pt did not discuss thier relationship.  Childhood History:  Childhood History By whom was/is the patient raised?: Adoptive parents Did patient suffer any verbal/emotional/physical/sexual abuse as a child?: No Did patient suffer from severe childhood neglect?: No Has patient ever been sexually abused/assaulted/raped as an adolescent or adult?: No Was the patient ever a victim of a crime or a disaster?: No Witnessed domestic violence?: No Has patient been affected by domestic violence as an adult?: No   CCA Substance Use Alcohol/Drug Use: Alcohol / Drug Use Pain Medications: See MAR Prescriptions: See MAR Over the Counter: See MAR History of alcohol / drug use?: Yes Longest period of sobriety (when/how long): Per chart, 5 years, (2015-2020) Negative Consequences of Use: Financial Withdrawal Symptoms: None Substance #1 Name of Substance 1: Alcohol. 1 - Age of First Use: 40's. 1 - Amount (size/oz): Pt reports, drinking a large amount of boot leggers. 1 - Frequency: Daily. 1 -  Duration: Ongoing. 1 - Last Use / Amount: Pt is unsure but knows it's a large amount. 1 - Method of Aquiring: Purchase. 1- Route of Use: Oral.    ASAM's:  Six Dimensions of Multidimensional Assessment  Dimension 1:  Acute Intoxication and/or Withdrawal Potential:   Dimension 1:  Description of individual's past and current experiences of substance use and withdrawal: None.  Dimension 2:  Biomedical Conditions and Complications:   Dimension 2:  Description of patient's biomedical  conditions and  complications: Pt reports, she had COVID in November 2024 but has long COVID making her Asthma worse.  Dimension 3:  Emotional, Behavioral, or Cognitive Conditions and Complications:  Dimension 3:  Description of emotional, behavioral, or cognitive conditions and complications: Depression.  Dimension 4:  Readiness to Change:  Dimension 4:  Description of Readiness to Change criteria: Pt reports, she wants help.  Dimension 5:  Relapse, Continued use, or Continued Problem Potential:  Dimension 5:  Relapse, continued use, or continued problem potential critiera description: Pt has ongoing use.  Dimension 6:  Recovery/Living Environment:  Dimension 6:  Recovery/Iiving environment criteria description: Pt's boyfriend is very supportive and wants her to get better.  ASAM Severity Score: ASAM's Severity Rating Score: 8  ASAM Recommended Level of Treatment: ASAM Recommended Level of Treatment: Level II Intensive Outpatient Treatment   Substance use Disorder (SUD) Substance Use Disorder (SUD)  Checklist Symptoms of Substance Use: Continued use despite having a persistent/recurrent physical/psychological problem caused/exacerbated by use, Continued use despite persistent or recurrent social, interpersonal problems, caused or exacerbated by use, Evidence of tolerance  Recommendations for Services/Supports/Treatments: Recommendations for Services/Supports/Treatments Recommendations For  Services/Supports/Treatments: Other (Comment)  Disposition Recommendation per psychiatric provider: We recommend transfer to [facility].   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/22/2023   Alcohol withdrawal (HCC) 04/10/2023   Asthma exacerbation 04/09/2023   Alcohol use disorder 12/26/2022   Asthma 12/26/2022   MDD (major depressive disorder), recurrent episode, severe (HCC) 12/25/2022   Depression, unspecified 01/06/2021   Alcohol use 01/06/2021   Insomnia 06/18/2020   SUI (stress urinary incontinence, female)    Hypertension    History of anemia    GERD (gastroesophageal reflux disease)    History of alcoholism (HCC) 02/25/2020   Intractable migraine with aura without status migrainosus 02/25/2020   Attention deficit hyperactivity disorder (ADHD), predominantly inattentive type 01/07/2020   GAD (generalized anxiety disorder) 01/07/2020   MDD (major depressive disorder), recurrent severe, without psychosis (HCC) 01/07/2020   Diverticulitis with abscess/fistula s/p robotic left colectomy 06/16/2020 09/06/2019   MDD (major depressive disorder) 10/07/2015   Alcohol abuse with alcohol-induced mood disorder (HCC) 10/06/2015     Referrals to Alternative Service(s): Referred to Alternative Service(s):   Place:   Date:   Time:    Referred to Alternative Service(s):   Place:   Date:   Time:    Referred to Alternative Service(s):   Place:   Date:   Time:    Referred to Alternative Service(s):   Place:   Date:   Time:     Jackson [name], [facility]   Jackson [name], MS, [facility], [facility] Triage Specialist [phone]

## 2024-01-11 NOTE — ED Notes (Signed)
 Patient in the bedroom calm and sleeping.  NAD.Respirations even and unlabored.  Will keep monitoring for safety

## 2024-01-11 NOTE — ED Notes (Addendum)
 Writer received a call from [employer] from [facility] about pt Etoh level result being 329. NP Joanette Moynahan notified.

## 2024-01-11 NOTE — Group Note (Signed)
 Group Topic: Communication  Group Date: 01/11/2024 Start Time: 0900 End Time: 1000 Facilitators: Herold Lajuana NOVAK, RN  Department: [facility]  Number of Participants: 7  Group Focus: communication Treatment Modality:  Individual Therapy Interventions utilized were leisure development Purpose: increase insight  Name: [name] Date of Birth: 1966/10/18  MR: [id number]    Level of Participation: active Quality of Participation: attentive Interactions with others: gave feedback Mood/Affect: appropriate Triggers (if applicable): none identified Cognition: coherent/clear Progress: Gaining insight Response: Pt verbalized understanding of indication of each medication given Plan: patient will be encouraged to notify staff with any questions or concerns about medication given  Patients Problems:  Patient Active Problem List   Diagnosis Date Noted   Alcohol abuse 01/10/2024   Asthma attacks lasting more than 24 hours 11/26/2023   Major depressive disorder, 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/22/2023   Alcohol withdrawal (HCC) 04/10/2023   Asthma exacerbation 04/09/2023   Alcohol use disorder 12/26/2022   Asthma 12/26/2022   MDD (major depressive disorder), recurrent episode, severe (HCC) 12/25/2022   Depression, unspecified 01/06/2021   Alcohol use 01/06/2021   Insomnia 06/18/2020   SUI (stress urinary incontinence, female)    Hypertension    History of anemia    GERD (gastroesophageal reflux disease)    History of alcoholism (HCC) 02/25/2020   Intractable migraine with aura without status migrainosus 02/25/2020   Attention deficit hyperactivity disorder (ADHD), predominantly inattentive type 01/07/2020   GAD (generalized anxiety disorder) 01/07/2020   MDD (major depressive disorder), recurrent severe, without psychosis (HCC) 01/07/2020   Diverticulitis with abscess/fistula s/p robotic left colectomy 06/16/2020 09/06/2019   MDD  (major depressive disorder) 10/07/2015   Alcohol abuse with alcohol-induced mood disorder (HCC) 10/06/2015

## 2024-01-11 NOTE — ED Notes (Addendum)
 Patient A&Ox4. Denies intent to harm self/others when asked. Denies A/VH. Patient tearful during assessment. Pt states, I can't believe my drinking have got me here. I drank a lot yesterday because of my anxiety. I usually don't drink that much. I won't do that again. Pt denies any physical complaints when asked. No acute distress noted. Support and encouragement provided. Routine safety checks conducted according to facility protocol. Encouraged patient to notify staff if thoughts of harm toward self or others arise. Patient verbalize understanding and agreement. Will continue to monitor for safety.

## 2024-01-11 NOTE — ED Provider Notes (Signed)
 Behavioral Health Progress Note  Date and Time: 01/11/2024 11:02 AM Name: [name] MRN:  [id number]  Subjective:  Patient is seen in treatment team room this AM. She reports her mood is hopeful and positive. Patient reports feel terrible. I only drank Sunday, Tuesday and Thursday. I do not need detox I don't think. I was sober before then. I think I can go home tomorrow.  Discussed will make trazodone  PRN. She denies any other withdrawal symptoms or other side effects from restarting medications. She reports desire to discharge tomorrow, states that she plans to return to her current arrangements at home.She is very tearful on evaluation, citing worsening depression. She rated her depression 10/10 with 10 being the worst on a Likert scale. She reports catching COVID in November and was in the hospital for almost a month, due to her asthma and lung disease. She states during this time she was unable to attend [facility] IOP. She states she is working 4 hours up to 2 x a day, and that her SOB has been physically debilitating. It appears these feelings are heightened in the context of alcohol and guilt for relapse.  She denies SI/HI/AVH.   Diagnosis:  Final diagnoses:  Alcohol abuse with intoxication (HCC)  Recurrent major depressive disorder, remission status unspecified (HCC)  Ineffective coping   Past Psychiatric History: Past psychiatric history of MDD and GAD. She reports she was on prozac , lamictal , gabapentin  and has also been on acamprosate  in the past. She reports she was unable to get appointment with outpatient psychiatrist (Dr. Shirline) for the past month to refill medications. She reports she was on prozac  80, gabapentin  600 BID, lamictal  100 BID. Not on propranolol . Per chart review, patient has had at least 5 psychiatric hospitalizations (10/2015, 12/2015, 01/2021, 12/2022, 11/2023). Most recent at [facility] 11/2023 for suicidal ideations. She has had prior medication trials including prozac , buspar ,  gabapentin , proprnaolol, lexapro , viibyrd, zoloft , lamictal , campral , naltrexone  (makes her nauseous), ambien , amphetamine . She has been non-compliant with medications.   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 She reports her support system is her daughter (lives in [place]), mom (lives across the street), and her friend. She is divorced.   Additional Social History:      Sleep: Fair  Appetite:  Good  Current Medications:  Current Facility-Administered Medications  Medication Dose Route Frequency Provider Last Rate Last Admin   acetaminophen  (TYLENOL ) tablet 650 mg  650 mg Oral Q6H PRN Trudy Carwin, NP   650 mg at 01/11/24 0920   albuterol  (VENTOLIN  HFA) 108 (90 Base) MCG/ACT inhaler 1-2 puff  1-2 puff Inhalation Q6H PRN Wilkie Majel RAMAN, FNP   1 puff at 01/11/24 0928   alum & mag hydroxide-simeth (MAALOX/MYLANTA) 200-200-20 MG/5ML suspension 30 mL  30 mL Oral Q4H PRN Trudy Carwin, NP       hydrOXYzine  (ATARAX ) tablet 25 mg  25 mg Oral Q6H PRN Trudy Carwin, NP       loperamide  (IMODIUM ) capsule 2-4 mg  2-4 mg Oral PRN Trudy Carwin, NP       LORazepam  (ATIVAN ) tablet 1 mg  1 mg Oral Q6H PRN Trudy Carwin, NP       LORazepam  (ATIVAN ) tablet 1 mg  1 mg Oral QID Trudy Carwin, NP   1 mg at 01/11/24 0920   Followed by   LORazepam  (ATIVAN ) tablet 1 mg  1 mg Oral TID Trudy,  Gaither, NP       Followed by   NOREEN ON 01/12/2024] LORazepam  (ATIVAN ) tablet 1 mg  1 mg Oral BID Trudy Gaither, NP       Followed by   NOREEN ON 01/14/2024] LORazepam  (ATIVAN ) tablet 1 mg  1 mg Oral Daily Trudy Gaither, NP       magnesium  hydroxide (MILK OF MAGNESIA) suspension 30 mL  30 mL Oral Daily PRN Trudy Gaither, NP       multivitamin with minerals tablet 1 tablet  1 tablet Oral Daily Trudy Gaither, NP   1 tablet at 01/11/24 9079   OLANZapine  (ZYPREXA ) injection 10 mg  10 mg Intramuscular TID PRN Trudy Gaither, NP       OLANZapine  (ZYPREXA ) injection 5 mg  5 mg Intramuscular TID PRN Trudy Gaither, NP       OLANZapine  zydis (ZYPREXA ) disintegrating tablet 5 mg  5 mg Oral TID PRN Trudy Gaither, NP       ondansetron  (ZOFRAN -ODT) disintegrating tablet 4 mg  4 mg Oral Q6H PRN Trudy Gaither, NP       thiamine  (VITAMIN B1) tablet 100 mg  100 mg Oral Daily Trudy Gaither, NP   100 mg at 01/11/24 9078   Current Outpatient Medications  Medication Sig Dispense Refill   acamprosate  (CAMPRAL ) 333 MG tablet Take 666 mg by mouth 3 (three) times daily.     albuterol  (ACCUNEB ) 1.25 MG/3ML nebulizer solution Take 1 ampule by nebulization 3 (three) times daily as needed for wheezing or shortness of breath.     albuterol  (VENTOLIN  HFA) 108 (90 Base) MCG/ACT inhaler Inhale 1-2 puffs into the lungs every 6 (six) hours as needed for wheezing.     budesonide  (PULMICORT ) 0.5 MG/2ML nebulizer solution Inhale 0.5 mg into the lungs daily.     busPIRone  (BUSPAR ) 15 MG tablet Take 7.5 mg by mouth 2 (two) times daily.     cloNIDine  (CATAPRES ) 0.1 MG tablet Take 0.1 mg by mouth 2 (two) times daily as needed (For anxiety).     DUPIXENT 300 MG/2ML SOAJ Inject 300 mg into the skin every 14 (fourteen) days.     FLUoxetine  (PROZAC ) 20 MG capsule Take 1 capsule (20 mg total) by mouth daily. 30 capsule 0   folic acid  (FOLVITE ) 1 MG tablet Take 1 mg by mouth daily.     gabapentin  (NEURONTIN ) 100 MG capsule Take 1 capsule (100 mg total) by mouth 3 (three) times daily. 90 capsule 0   hydrOXYzine  (ATARAX ) 25 MG tablet Take 25 mg by mouth every 6 (six) hours as needed (For anxiety or agitation).     lamoTRIgine  (LAMICTAL ) 100 MG tablet Take 50 mg by mouth 2 (two) times daily.     nicotine  (NICODERM CQ  - DOSED IN MG/24 HOURS) 14 mg/24hr patch Place 14 mg onto the skin daily as needed (For smoking cessation).     progesterone  (PROMETRIUM ) 100 MG capsule Take 100-200 mg by mouth at bedtime.     SUMAtriptan  (IMITREX ) 100 MG tablet Take 1 tablet  (100 mg total) by mouth every 2 (two) hours as needed for migraine. May repeat in 2 hours if headache persists or recurs. Do not exceed 2 tablets within a 24 hour period. 10 tablet 0   thiamine  (VITAMIN B-1) 100 MG tablet Take 1 tablet (100 mg total) by mouth daily. 30 tablet 0   traZODone  (DESYREL ) 50 MG tablet Take 1 tablet (50 mg total) by mouth at bedtime as needed for sleep. 30 tablet 0  TRELEGY ELLIPTA 100-62.5-25 MCG/ACT AEPB Inhale 1 puff into the lungs daily.     triamcinolone  cream (KENALOG ) 0.5 % Apply topically 2 (two) times daily. (Patient not taking: Reported on 01/11/2024) 30 g 0    Labs  Lab Results:  Admission on 01/10/2024  Component Date Value Ref Range Status   WBC 01/10/2024 8.1  4.0 - 10.5 K/uL Final   RBC 01/10/2024 4.39  3.87 - 5.11 MIL/uL Final   Hemoglobin 01/10/2024 14.8  12.0 - 15.0 g/dL Final   HCT 97/93/7974 43.8  36.0 - 46.0 % Final   MCV 01/10/2024 99.8  80.0 - 100.0 fL Final   MCH 01/10/2024 33.7  26.0 - 34.0 pg Final   MCHC 01/10/2024 33.8  30.0 - 36.0 g/dL Final   RDW 97/93/7974 14.6  11.5 - 15.5 % Final   Platelets 01/10/2024 199  150 - 400 K/uL Final   nRBC 01/10/2024 0.0  0.0 - 0.2 % Final   Neutrophils Relative % 01/10/2024 42  % Final   Neutro Abs 01/10/2024 3.4  1.7 - 7.7 K/uL Final   Lymphocytes Relative 01/10/2024 47  % Final   Lymphs Abs 01/10/2024 3.8  0.7 - 4.0 K/uL Final   Monocytes Relative 01/10/2024 8  % Final   Monocytes Absolute 01/10/2024 0.6  0.1 - 1.0 K/uL Final   Eosinophils Relative 01/10/2024 2  % Final   Eosinophils Absolute 01/10/2024 0.1  0.0 - 0.5 K/uL Final   Basophils Relative 01/10/2024 1  % Final   Basophils Absolute 01/10/2024 0.1  0.0 - 0.1 K/uL Final   Immature Granulocytes 01/10/2024 0  % Final   Abs Immature Granulocytes 01/10/2024 0.02  0.00 - 0.07 K/uL Final   Performed at [facility], [address] [place], [place] [address]   Sodium 01/10/2024 141  135 - 145 mmol/L Final   Potassium 01/10/2024 3.6   3.5 - 5.1 mmol/L Final   Chloride 01/10/2024 101  98 - 111 mmol/L Final   CO2 01/10/2024 27  22 - 32 mmol/L Final   Glucose, Bld 01/10/2024 113 (H)  70 - 99 mg/dL Final   Glucose reference range applies only to samples taken after fasting for at least 8 hours.   BUN 01/10/2024 <5 (L)  6 - 20 mg/dL Final   Creatinine, Ser 01/10/2024 0.78  0.44 - 1.00 mg/dL Final   Calcium  01/10/2024 8.8 (L)  8.9 - 10.3 mg/dL Final   Total Protein 97/93/7974 6.2 (L)  6.5 - 8.1 g/dL Final   Albumin 97/93/7974 3.7  3.5 - 5.0 g/dL Final   AST 97/93/7974 136 (H)  15 - 41 U/L Final   ALT 01/10/2024 56 (H)  0 - 44 U/L Final   Alkaline Phosphatase 01/10/2024 171 (H)  38 - 126 U/L Final   Total Bilirubin 01/10/2024 0.7  0.0 - 1.2 mg/dL Final   GFR, Estimated 01/10/2024 >60  >60 mL/min Final   Comment: (NOTE) Calculated using the CKD-EPI Creatinine Equation (2021)    Anion gap 01/10/2024 13  5 - 15 Final   Performed at [facility], [address] [place], [place] [address]   Alcohol, Ethyl (B) 01/10/2024 329 (HH)  <10 mg/dL Final   Comment: CRITICAL RESULT CALLED TO, READ BACK BY AND VERIFIED WITH BING PARAS, RN [phone] 01/11/2024 SANDOVAL K (NOTE) Lowest detectable limit for serum alcohol is 10 mg/dL.  For medical purposes only. Performed at [facility], [address] [place], [place] [address]  Free T4 01/10/2024 0.59 (L)  0.61 - 1.12 ng/dL Final   Comment: (NOTE) Biotin ingestion may interfere with free T4 tests. If the results are inconsistent with the TSH level, previous test results, or the clinical presentation, then consider biotin interference. If needed, order repeat testing after stopping biotin. Performed at [facility], [address], [place], [place] [address]    Hepatitis B Surface Ag 01/10/2024 NON REACTIVE  NON REACTIVE Final   HCV Ab 01/10/2024 NON REACTIVE  NON REACTIVE Final   Comment: (NOTE) Nonreactive HCV antibody screen is consistent with no HCV infections,   unless recent infection is suspected or other evidence exists to indicate HCV infection.     Hep A IgM 01/10/2024 NON REACTIVE  NON REACTIVE Final   Hep B C IgM 01/10/2024 NON REACTIVE  NON REACTIVE Final   Performed at [facility], [address], [place], [place] [address]   Preg Test, Ur 01/10/2024 Negative  Negative Final   POC Amphetamine  UR 01/10/2024 None Detected  NONE DETECTED (Cut Off Level 1000 ng/mL) Final   POC Secobarbital (BAR) 01/10/2024 None Detected  NONE DETECTED (Cut Off Level 300 ng/mL) Final   POC Buprenorphine (BUP) 01/10/2024 None Detected  NONE DETECTED (Cut Off Level 10 ng/mL) Final   POC Oxazepam (BZO) 01/10/2024 None Detected  NONE DETECTED (Cut Off Level 300 ng/mL) Final   POC Cocaine UR 01/10/2024 None Detected  NONE DETECTED (Cut Off Level 300 ng/mL) Final   POC Methamphetamine UR 01/10/2024 None Detected  NONE DETECTED (Cut Off Level 1000 ng/mL) Final   POC Morphine  01/10/2024 None Detected  NONE DETECTED (Cut Off Level 300 ng/mL) Final   POC Methadone UR 01/10/2024 None Detected  NONE DETECTED (Cut Off Level 300 ng/mL) Final   POC Oxycodone  UR 01/10/2024 None Detected  NONE DETECTED (Cut Off Level 100 ng/mL) Final   POC Marijuana UR 01/10/2024 None Detected  NONE DETECTED (Cut Off Level 50 ng/mL) Final   Cholesterol 01/10/2024 147  0 - 200 mg/dL Final   Triglycerides 97/93/7974 494 (H)  <150 mg/dL Final   HDL 97/93/7974 79  >40 mg/dL Final   Total CHOL/HDL Ratio 01/10/2024 1.9  RATIO Final   VLDL 01/10/2024 UNABLE TO CALCULATE IF TRIGLYCERIDE OVER 400 mg/dL  0 - 40 mg/dL Final   LDL Cholesterol 01/10/2024 UNABLE TO CALCULATE IF TRIGLYCERIDE OVER 400 mg/dL  0 - 99 mg/dL Final   Comment:        Total Cholesterol/HDL:CHD Risk Coronary Heart Disease Risk Table                     Men   Women  1/2 Average Risk   3.4   3.3  Average Risk       5.0   4.4  2 X Average Risk   9.6   7.1  3 X Average Risk  23.4   11.0        Use the calculated Patient  Ratio above and the CHD Risk Table to determine the patient's CHD Risk.        ATP III CLASSIFICATION (LDL):  <100     mg/dL   Optimal  899-870  mg/dL   Near or Above                    Optimal  130-159  mg/dL   Borderline  839-810  mg/dL   High  >809     mg/dL  Very High Performed at [facility], [address], [place], [place] [address]    TSH 01/10/2024 0.786  0.350 - 4.500 uIU/mL Final   Comment: Performed by a 3rd Generation assay with a functional sensitivity of <=0.01 uIU/mL. Performed at [facility], [address], [place], [place] [address]    Direct LDL 01/10/2024 43  0 - 99 mg/dL Final   Performed at [facility], [address] [place] [address], [place], [place] [address]  Admission on 11/26/2023, Discharged on 11/28/2023  Component Date Value Ref Range Status   WBC 11/26/2023 8.1  4.0 - 10.5 K/uL Final   RBC 11/26/2023 4.23  3.87 - 5.11 MIL/uL Final   Hemoglobin 11/26/2023 13.6  12.0 - 15.0 g/dL Final   HCT 87/76/7975 41.3  36.0 - 46.0 % Final   MCV 11/26/2023 97.6  80.0 - 100.0 fL Final   MCH 11/26/2023 32.2  26.0 - 34.0 pg Final   MCHC 11/26/2023 32.9  30.0 - 36.0 g/dL Final   RDW 87/76/7975 14.6  11.5 - 15.5 % Final   Platelets 11/26/2023 176  150 - 400 K/uL Final   nRBC 11/26/2023 0.0  0.0 - 0.2 % Final   Neutrophils Relative % 11/26/2023 59  % Final   Neutro Abs 11/26/2023 4.8  1.7 - 7.7 K/uL Final   Lymphocytes Relative 11/26/2023 30  % Final   Lymphs Abs 11/26/2023 2.4  0.7 - 4.0 K/uL Final   Monocytes Relative 11/26/2023 9  % Final   Monocytes Absolute 11/26/2023 0.7  0.1 - 1.0 K/uL Final   Eosinophils Relative 11/26/2023 1  % Final   Eosinophils Absolute 11/26/2023 0.0  0.0 - 0.5 K/uL Final   Basophils Relative 11/26/2023 0  % Final   Basophils Absolute 11/26/2023 0.0  0.0 - 0.1 K/uL Final   Immature Granulocytes 11/26/2023 1  % Final   Abs Immature Granulocytes 11/26/2023 0.06  0.00 - 0.07 K/uL Final   Performed at [facility], [address] [place], [place] [address]   Sodium 11/26/2023 141  135 - 145 mmol/L Final   Potassium 11/26/2023 3.6  3.5 - 5.1 mmol/L Final   Chloride 11/26/2023 102  98 - 111 mmol/L Final   CO2 11/26/2023 28  22 - 32 mmol/L Final   Glucose, Bld 11/26/2023 105 (H)  70 - 99 mg/dL Final   Glucose reference range applies only to samples taken after fasting for at least 8 hours.   BUN 11/26/2023 13  6 - 20 mg/dL Final   Creatinine, Ser 11/26/2023 0.62  0.44 - 1.00 mg/dL Final   Calcium  11/26/2023 8.4 (L)  8.9 - 10.3 mg/dL Final   Total Protein 87/76/7975 6.4 (L)  6.5 - 8.1 g/dL Final   Albumin 87/76/7975 3.5  3.5 - 5.0 g/dL Final   AST 87/76/7975 120 (H)  15 - 41 U/L Final   ALT 11/26/2023 64 (H)  0 - 44 U/L Final   Alkaline Phosphatase 11/26/2023 123  38 - 126 U/L Final   Total Bilirubin 11/26/2023 0.5  <1.2 mg/dL Final   GFR, Estimated 11/26/2023 >60  >60 mL/min Final   Comment: (NOTE) Calculated using the CKD-EPI Creatinine Equation (2021)    Anion gap 11/26/2023 11  5 - 15 Final   Performed at [facility], [address] [place], [place] [address]   B Natriuretic Peptide 11/26/2023 14.9  0.0 - 100.0 pg/mL Final   Performed at [facility], [address] [place], [place] [address]   Troponin I (High Sensitivity) 11/26/2023 5  <18 ng/L Final   Comment: (NOTE) Elevated high sensitivity troponin I (hsTnI) values and significant  changes across serial measurements may suggest ACS but many other  chronic and acute conditions are known to elevate hsTnI results.  Refer to the Links section for chest pain algorithms and additional  guidance. Performed at [facility], [address] [place], [place] [address]    Troponin I (High Sensitivity) 11/26/2023 6  <18 ng/L Final   Comment: (NOTE) Elevated high sensitivity troponin I (hsTnI) values and significant  changes across serial measurements may suggest ACS but many other  chronic and acute conditions are  known to elevate hsTnI results.  Refer to the Links section for chest pain algorithms and additional  guidance. Performed at [facility], [address] [place], [place] [address]    TSH 11/26/2023 1.265  0.350 - 4.500 uIU/mL Final   Comment: Performed by a 3rd Generation assay with a functional sensitivity of <=0.01 uIU/mL. Performed at [facility], [address] [place], [place] [address]    WBC 11/28/2023 11.8 (H)  4.0 - 10.5 K/uL Final   RBC 11/28/2023 4.25  3.87 - 5.11 MIL/uL Final   Hemoglobin 11/28/2023 14.1  12.0 - 15.0 g/dL Final   HCT 87/74/7975 43.0  36.0 - 46.0 % Final   MCV 11/28/2023 101.2 (H)  80.0 - 100.0 fL Final   MCH 11/28/2023 33.2  26.0 - 34.0 pg Final   MCHC 11/28/2023 32.8  30.0 - 36.0 g/dL Final   RDW 87/74/7975 14.5  11.5 - 15.5 % Final   Platelets 11/28/2023 145 (L)  150 - 400 K/uL Final   nRBC 11/28/2023 0.0  0.0 - 0.2 % Final   Neutrophils Relative % 11/28/2023 78  % Final   Neutro Abs 11/28/2023 9.2 (H)  1.7 - 7.7 K/uL Final   Lymphocytes Relative 11/28/2023 14  % Final   Lymphs Abs 11/28/2023 1.6  0.7 - 4.0 K/uL Final   Monocytes Relative 11/28/2023 7  % Final   Monocytes Absolute 11/28/2023 0.8  0.1 - 1.0 K/uL Final   Eosinophils Relative 11/28/2023 0  % Final   Eosinophils Absolute 11/28/2023 0.0  0.0 - 0.5 K/uL Final   Basophils Relative 11/28/2023 0  % Final   Basophils Absolute 11/28/2023 0.0  0.0 - 0.1 K/uL Final   Immature Granulocytes 11/28/2023 1  % Final   Abs Immature Granulocytes 11/28/2023 0.14 (H)  0.00 - 0.07 K/uL Final   Performed at [facility], [address] [place], [place] [address]   Sodium 11/28/2023 138  135 - 145 mmol/L Final   Potassium 11/28/2023 3.8  3.5 - 5.1 mmol/L Final   Chloride 11/28/2023 103  98 - 111 mmol/L Final   CO2 11/28/2023 25  22 - 32 mmol/L Final   Glucose, Bld 11/28/2023 108 (H)  70 - 99 mg/dL Final   Glucose reference range applies only to samples taken after  fasting for at least 8 hours.   BUN 11/28/2023 25 (H)  6 - 20 mg/dL Final   Creatinine, Ser 11/28/2023 0.70  0.44 - 1.00 mg/dL Final   Calcium  11/28/2023 8.7 (L)  8.9 - 10.3 mg/dL Final   GFR, Estimated 11/28/2023 >60  >60 mL/min Final   Comment: (NOTE) Calculated using the CKD-EPI Creatinine Equation (2021)    Anion gap 11/28/2023 10  5 - 15 Final   Performed at [facility], [address] [place], [place] [address]  Admission on 11/18/2023, Discharged on 11/18/2023  Component Date Value Ref Range Status   WBC 11/18/2023 5.3  4.0 - 10.5 K/uL Final   RBC 11/18/2023 4.42  3.87 - 5.11 MIL/uL Final   Hemoglobin 11/18/2023 14.3  12.0 - 15.0 g/dL Final   HCT 87/84/7975 42.7  36.0 - 46.0 % Final   MCV 11/18/2023 96.6  80.0 - 100.0 fL Final   MCH 11/18/2023 32.4  26.0 - 34.0 pg Final   MCHC 11/18/2023 33.5  30.0 - 36.0 g/dL Final   RDW 87/84/7975 13.8  11.5 - 15.5 % Final   Platelets 11/18/2023 227  150 - 400 K/uL Final   nRBC 11/18/2023 0.0  0.0 - 0.2 % Final   Neutrophils Relative % 11/18/2023 55  % Final   Neutro Abs 11/18/2023 2.9  1.7 - 7.7 K/uL Final   Lymphocytes Relative 11/18/2023 38  % Final   Lymphs Abs 11/18/2023 2.0  0.7 - 4.0 K/uL Final   Monocytes Relative 11/18/2023 5  % Final   Monocytes Absolute 11/18/2023 0.3  0.1 - 1.0 K/uL Final   Eosinophils Relative 11/18/2023 1  % Final   Eosinophils Absolute 11/18/2023 0.1  0.0 - 0.5 K/uL Final   Basophils Relative 11/18/2023 1  % Final   Basophils Absolute 11/18/2023 0.0  0.0 - 0.1 K/uL Final   Immature Granulocytes 11/18/2023 0  % Final   Abs Immature Granulocytes 11/18/2023 0.01  0.00 - 0.07 K/uL Final   Performed at [facility], [address], [place], [place] [address]   Sodium 11/18/2023 138  135 - 145 mmol/L Final   Potassium 11/18/2023 2.7 (LL)  3.5 - 5.1 mmol/L Final   Comment: CRITICAL RESULT CALLED TO, READ BACK BY AND VERIFIED WITH HARRIS, S RN AT 1602 ON 11/18/2023 BY PRUDY, K     Chloride 11/18/2023 106  98 - 111 mmol/L Final   CO2 11/18/2023 23  22 - 32 mmol/L Final   Glucose, Bld 11/18/2023 134 (H)  70 - 99 mg/dL Final   Glucose reference range applies only to samples taken after fasting for at least 8 hours.   BUN 11/18/2023 9  6 - 20 mg/dL Final   Creatinine, Ser 11/18/2023 0.63  0.44 - 1.00 mg/dL Final   Calcium  11/18/2023 8.1 (L)  8.9 - 10.3 mg/dL Final   Total Protein 87/84/7975 6.2 (L)  6.5 - 8.1 g/dL Final   Albumin 87/84/7975 3.5  3.5 - 5.0 g/dL Final   AST 87/84/7975 26  15 - 41 U/L Final   ALT 11/18/2023 22  0 - 44 U/L Final   Alkaline Phosphatase 11/18/2023 111  38 - 126 U/L Final   Total Bilirubin 11/18/2023 0.6  <1.2 mg/dL Final   GFR, Estimated 11/18/2023 >60  >60 mL/min Final   Comment: (NOTE) Calculated using the CKD-EPI Creatinine Equation (2021)    Anion gap 11/18/2023 9  5 - 15 Final   Performed at [facility], [address], [place], [place] [address]   SARS Coronavirus 2 by RT PCR 11/18/2023 POSITIVE (A)  NEGATIVE Final   Comment: (NOTE) SARS-CoV-2 target nucleic acids are DETECTED.  The SARS-CoV-2 RNA is generally detectable in upper respiratory specimens during the acute phase of infection. Positive results are indicative of the presence of the identified virus, but do not rule out bacterial infection or co-infection with other pathogens not detected by the test. Clinical correlation with patient history  and other diagnostic information is necessary to determine patient infection status. The expected result is Negative.  Fact Sheet for Patients: bloggercourse.com  Fact Sheet for Healthcare Providers: seriousbroker.it  This test is not yet approved or cleared by the [place]  FDA and  has been authorized for detection and/or diagnosis of SARS-CoV-2 by FDA under an Emergency Use Authorization (EUA).  This EUA will remain in effect (meaning this test can be  used) for the duration of  the COVID-19 declaration under Section 564(b)(1) of the A                          ct, 21 U.S.C. section 360bbb-3(b)(1), unless the authorization is terminated or revoked sooner.     Influenza A by PCR 11/18/2023 NEGATIVE  NEGATIVE Final   Influenza B by PCR 11/18/2023 NEGATIVE  NEGATIVE Final   Comment: (NOTE) The Xpert Xpress SARS-CoV-2/FLU/RSV plus assay is intended as an aid in the diagnosis of influenza from Nasopharyngeal swab specimens and should not be used as a sole basis for treatment. Nasal washings and aspirates are unacceptable for Xpert Xpress SARS-CoV-2/FLU/RSV testing.  Fact Sheet for Patients: bloggercourse.com  Fact Sheet for Healthcare Providers: seriousbroker.it  This test is not yet approved or cleared by the [place]  FDA and has been authorized for detection and/or diagnosis of SARS-CoV-2 by FDA under an Emergency Use Authorization (EUA). This EUA will remain in effect (meaning this test can be used) for the duration of the COVID-19 declaration under Section 564(b)(1) of the Act, 21 U.S.C. section 360bbb-3(b)(1), unless the authorization is terminated or revoked.     Resp Syncytial Virus by PCR 11/18/2023 NEGATIVE  NEGATIVE Final   Comment: (NOTE) Fact Sheet for Patients: bloggercourse.com  Fact Sheet for Healthcare Providers: seriousbroker.it  This test is not yet approved or cleared by the [place]  FDA and has been authorized for detection and/or diagnosis of SARS-CoV-2 by FDA under an Emergency Use Authorization (EUA). This EUA will remain in effect (meaning this test can be used) for the duration of the COVID-19 declaration under Section 564(b)(1) of the Act, 21 U.S.C. section 360bbb-3(b)(1), unless the authorization is terminated or revoked.  Performed at [facility], [address] [place], [place] [address]    Alcohol, Ethyl (B) 11/18/2023 186 (H)  <10 mg/dL Final   Comment: (NOTE) Lowest detectable limit for serum alcohol is 10 mg/dL.  For medical purposes only. Performed at [facility], [address] [place], [place] [address]   Admission on 10/24/2023, Discharged on 10/26/2023  Component Date Value Ref Range Status   Opiates 10/25/2023 NONE DETECTED  NONE DETECTED Final   Cocaine 10/25/2023 NONE DETECTED  NONE DETECTED Final   Benzodiazepines 10/25/2023 POSITIVE (A)  NONE DETECTED Final   Amphetamines 10/25/2023 NONE DETECTED  NONE DETECTED Final   Tetrahydrocannabinol 10/25/2023 NONE DETECTED  NONE DETECTED Final   Barbiturates 10/25/2023 NONE DETECTED  NONE DETECTED Final   Comment: (NOTE) DRUG SCREEN FOR MEDICAL PURPOSES ONLY.  IF CONFIRMATION IS NEEDED FOR ANY PURPOSE, NOTIFY LAB WITHIN 5 DAYS.  LOWEST DETECTABLE LIMITS FOR URINE DRUG SCREEN Drug Class                     Cutoff (ng/mL) Amphetamine  and metabolites    1000 Barbiturate and metabolites    200 Benzodiazepine                 200 Opiates and  metabolites        300 Cocaine and metabolites        300 THC                            50 Performed at [facility], [address], [place], [place] [address]    TSH 10/25/2023 0.454  0.350 - 4.500 uIU/mL Final   Comment: Performed by a 3rd Generation assay with a functional sensitivity of <=0.01 uIU/mL. Performed at [facility], [address] [place], [place] [address]   Admission on 10/23/2023, Discharged on 10/23/2023  Component Date Value Ref Range Status   WBC 10/23/2023 6.0  4.0 - 10.5 K/uL Final   RBC 10/23/2023 4.87  3.87 - 5.11 MIL/uL Final   Hemoglobin 10/23/2023 15.3 (H)  12.0 - 15.0 g/dL Final   HCT 88/80/7975 46.8 (H)  36.0 - 46.0 % Final   MCV 10/23/2023 96.1  80.0 - 100.0 fL Final   MCH 10/23/2023 31.4  26.0 - 34.0 pg Final   MCHC 10/23/2023 32.7  30.0 - 36.0 g/dL Final   RDW  88/80/7975 14.5  11.5 - 15.5 % Final   Platelets 10/23/2023 243  150 - 400 K/uL Final   nRBC 10/23/2023 0.0  0.0 - 0.2 % Final   Neutrophils Relative % 10/23/2023 43  % Final   Neutro Abs 10/23/2023 2.6  1.7 - 7.7 K/uL Final   Lymphocytes Relative 10/23/2023 47  % Final   Lymphs Abs 10/23/2023 2.8  0.7 - 4.0 K/uL Final   Monocytes Relative 10/23/2023 6  % Final   Monocytes Absolute 10/23/2023 0.4  0.1 - 1.0 K/uL Final   Eosinophils Relative 10/23/2023 3  % Final   Eosinophils Absolute 10/23/2023 0.2  0.0 - 0.5 K/uL Final   Basophils Relative 10/23/2023 1  % Final   Basophils Absolute 10/23/2023 0.1  0.0 - 0.1 K/uL Final   Immature Granulocytes 10/23/2023 0  % Final   Abs Immature Granulocytes 10/23/2023 0.01  0.00 - 0.07 K/uL Final   Performed at [facility], [address] [place], [place] [address]   Sodium 10/23/2023 144  135 - 145 mmol/L Final   Potassium 10/23/2023 4.2  3.5 - 5.1 mmol/L Final   Chloride 10/23/2023 105  98 - 111 mmol/L Final   CO2 10/23/2023 26  22 - 32 mmol/L Final   Glucose, Bld 10/23/2023 65 (L)  70 - 99 mg/dL Final   Glucose reference range applies only to samples taken after fasting for at least 8 hours.   BUN 10/23/2023 8  6 - 20 mg/dL Final   Creatinine, Ser 10/23/2023 0.71  0.44 - 1.00 mg/dL Final   Calcium  10/23/2023 8.8 (L)  8.9 - 10.3 mg/dL Final   Total Protein 88/80/7975 7.4  6.5 - 8.1 g/dL Final   Albumin 88/80/7975 4.2  3.5 - 5.0 g/dL Final   AST 88/80/7975 39  15 - 41 U/L Final   ALT 10/23/2023 22  0 - 44 U/L Final   Alkaline Phosphatase 10/23/2023 136 (H)  38 - 126 U/L Final   Total Bilirubin 10/23/2023 0.5  <1.2 mg/dL Final   GFR, Estimated 10/23/2023 >60  >60 mL/min Final   Comment: (NOTE) Calculated using the CKD-EPI Creatinine Equation (2021)    Anion gap 10/23/2023 13  5 - 15 Final   Performed at [facility], [address] [place], [place] [address]  Hgb A1c MFr Bld 10/23/2023 5.2  4.8 - 5.6 % Final   Comment:  (NOTE) Pre diabetes:          5.7%-6.4%  Diabetes:              >6.4%  Glycemic control for   <7.0% adults with diabetes    Mean Plasma Glucose 10/23/2023 102.54  mg/dL Final   Performed at [facility], [address], [place], [place] [address]   Magnesium  10/23/2023 2.1  1.7 - 2.4 mg/dL Final   Performed at [facility], [address], [place], [place] [address]   Alcohol, Ethyl (B) 10/23/2023 255 (H)  <10 mg/dL Final   Comment: (NOTE) Lowest detectable limit for serum alcohol is 10 mg/dL.  For medical purposes only. Performed at [facility], [address] [place], [place] [address]    Cholesterol 10/23/2023 158  0 - 200 mg/dL Final   Triglycerides 88/80/7975 214 (H)  <150 mg/dL Final   HDL 88/80/7975 105  >40 mg/dL Final   Total CHOL/HDL Ratio 10/23/2023 1.5  RATIO Final   VLDL 10/23/2023 43 (H)  0 - 40 mg/dL Final   LDL Cholesterol 10/23/2023 10  0 - 99 mg/dL Final   Comment:        Total Cholesterol/HDL:CHD Risk Coronary Heart Disease Risk Table                     Men   Women  1/2 Average Risk   3.4   3.3  Average Risk       5.0   4.4  2 X Average Risk   9.6   7.1  3 X Average Risk  23.4   11.0        Use the calculated Patient Ratio above and the CHD Risk Table to determine the patient's CHD Risk.        ATP III CLASSIFICATION (LDL):  <100     mg/dL   Optimal  899-870  mg/dL   Near or Above                    Optimal  130-159  mg/dL   Borderline  839-810  mg/dL   High  >809     mg/dL   Very High Performed at [facility], [address] [place], [place] [address]    TSH 10/23/2023 0.306 (L)  0.350 - 4.500 uIU/mL Final   Comment: Performed by a 3rd Generation assay with a functional sensitivity of <=0.01 uIU/mL. Performed at [facility], [address], [place], [place] [address]    Prolactin 10/23/2023 9.7  3.6 - 25.2 ng/mL Final   Comment: (NOTE) Performed At: [facility] [address] [place], [place]  [phone] Jennette Shorter MD Ey:[id number]    Glucose-Capillary 10/23/2023 89  70 - 99 mg/dL Final   Glucose reference range applies only to samples taken after fasting for at least 8 hours.  Admission on 08/20/2023, Discharged on 08/21/2023  Component Date Value Ref Range Status   Sodium 08/20/2023 138  135 - 145 mmol/L Final   Potassium 08/20/2023 3.8  3.5 - 5.1 mmol/L Final   Chloride 08/20/2023 106  98 - 111 mmol/L Final   CO2 08/20/2023 22  22 - 32 mmol/L Final   Glucose, Bld 08/20/2023 83  70 - 99 mg/dL Final   Glucose reference range applies only to samples taken after fasting for at  least 8 hours.   BUN 08/20/2023 12  6 - 20 mg/dL Final   Creatinine, Ser 08/20/2023 0.60  0.44 - 1.00 mg/dL Final   Calcium  08/20/2023 8.3 (L)  8.9 - 10.3 mg/dL Final   Total Protein 90/83/7975 7.4  6.5 - 8.1 g/dL Final   Albumin 90/83/7975 3.5  3.5 - 5.0 g/dL Final   AST 90/83/7975 23  15 - 41 U/L Final   ALT 08/20/2023 23  0 - 44 U/L Final   Alkaline Phosphatase 08/20/2023 130 (H)  38 - 126 U/L Final   Total Bilirubin 08/20/2023 0.3  0.3 - 1.2 mg/dL Final   GFR, Estimated 08/20/2023 >60  >60 mL/min Final   Comment: (NOTE) Calculated using the CKD-EPI Creatinine Equation (2021)    Anion gap 08/20/2023 10  5 - 15 Final   Performed at [facility], [address] [place], [place] [address]   Alcohol, Ethyl (B) 08/20/2023 214 (H)  <10 mg/dL Final   Comment: (NOTE) Lowest detectable limit for serum alcohol is 10 mg/dL.  For medical purposes only. Performed at [facility], [address], [place], [place] [address]    Salicylate Lvl 08/20/2023 17.8  7.0 - 30.0 mg/dL Final   Performed at [facility], [address], [place], [place] [address]   Acetaminophen  (Tylenol ), Serum 08/20/2023 <10 (L)  10 - 30 ug/mL Final   Comment: (NOTE) Therapeutic concentrations vary significantly. A range of 10-30 ug/mL  may be an effective concentration  for many patients. However, some  are best treated at concentrations outside of this range. Acetaminophen  concentrations >150 ug/mL at 4 hours after ingestion  and >50 ug/mL at 12 hours after ingestion are often associated with  toxic reactions.  Performed at [facility], [address], [place], [place] [address]    WBC 08/20/2023 6.4  4.0 - 10.5 K/uL Final   RBC 08/20/2023 4.26  3.87 - 5.11 MIL/uL Final   Hemoglobin 08/20/2023 12.9  12.0 - 15.0 g/dL Final   HCT 90/83/7975 40.7  36.0 - 46.0 % Final   MCV 08/20/2023 95.5  80.0 - 100.0 fL Final   MCH 08/20/2023 30.3  26.0 - 34.0 pg Final   MCHC 08/20/2023 31.7  30.0 - 36.0 g/dL Final   RDW 90/83/7975 12.8  11.5 - 15.5 % Final   Platelets 08/20/2023 332  150 - 400 K/uL Final   nRBC 08/20/2023 0.0  0.0 - 0.2 % Final   Performed at [facility], [address], [place], [place] [address]   Opiates 08/20/2023 NONE DETECTED  NONE DETECTED Final   Cocaine 08/20/2023 NONE DETECTED  NONE DETECTED Final   Benzodiazepines 08/20/2023 NONE DETECTED  NONE DETECTED Final   Amphetamines 08/20/2023 NONE DETECTED  NONE DETECTED Final   Tetrahydrocannabinol 08/20/2023 NONE DETECTED  NONE DETECTED Final   Barbiturates 08/20/2023 NONE DETECTED  NONE DETECTED Final   Comment: (NOTE) DRUG SCREEN FOR MEDICAL PURPOSES ONLY.  IF CONFIRMATION IS NEEDED FOR ANY PURPOSE, NOTIFY LAB WITHIN 5 DAYS.  LOWEST DETECTABLE LIMITS FOR URINE DRUG SCREEN Drug Class                     Cutoff (ng/mL) Amphetamine  and metabolites    1000 Barbiturate and metabolites    200 Benzodiazepine                 200 Opiates and metabolites        300 Cocaine  and metabolites        300 THC                            50 Performed at [facility], [address] [place], [place] [address]    SARS Coronavirus 2 by RT PCR 08/21/2023 NEGATIVE  NEGATIVE Final   Comment: (NOTE) SARS-CoV-2 target nucleic acids are NOT  DETECTED.  The SARS-CoV-2 RNA is generally detectable in upper and lower respiratory specimens during the acute phase of infection. The lowest concentration of SARS-CoV-2 viral copies this assay can detect is 250 copies / mL. A negative result does not preclude SARS-CoV-2 infection and should not be used as the sole basis for treatment or other patient management decisions.  A negative result may occur with improper specimen collection / handling, submission of specimen other than nasopharyngeal swab, presence of viral mutation(s) within the areas targeted by this assay, and inadequate number of viral copies (<250 copies / mL). A negative result must be combined with clinical observations, patient history, and epidemiological information.  Fact Sheet for Patients:   roadlaptop.co.za  Fact Sheet for Healthcare Providers: http://kim-miller.com/  This test is not yet approved or                           cleared by the [place]  FDA and has been authorized for detection and/or diagnosis of SARS-CoV-2 by FDA under an Emergency Use Authorization (EUA).  This EUA will remain in effect (meaning this test can be used) for the duration of the COVID-19 declaration under Section 564(b)(1) of the Act, 21 U.S.C. section 360bbb-3(b)(1), unless the authorization is terminated or revoked sooner.  Performed at [facility], [address] [place], [place] [address]     Blood Alcohol level:  Lab Results  Component Value Date   ETH 329 [facility]) 01/10/2024   ETH 186 (H) 11/18/2023    Metabolic Disorder Labs: Lab Results  Component Value Date   HGBA1C 5.2 10/23/2023   MPG 102.54 10/23/2023   MPG 102.54 12/25/2022   Lab Results  Component Value Date   PROLACTIN 9.7 10/23/2023   Lab Results  Component Value Date   CHOL 147 01/10/2024   TRIG 494 (H) 01/10/2024   HDL 79 01/10/2024   CHOLHDL 1.9 01/10/2024   VLDL UNABLE  TO CALCULATE IF TRIGLYCERIDE OVER 400 mg/dL 97/93/7974   LDLCALC UNABLE TO CALCULATE IF TRIGLYCERIDE OVER 400 mg/dL 97/93/7974   LDLCALC 10 10/23/2023    Therapeutic Lab Levels: Lab Results  Component Value Date   LITHIUM  <0.06 (L) 11/23/2016   No results found for: VALPROATE No results found for: CBMZ  Physical Findings   AIMS    Flowsheet Row [facility]) [facility] (Dis[facility]) [facility] Admission (Discharged) [facility] Admission (Discharged) [facility]  AIMS Total Score 0 0 0 0      AUDIT    Flowsheet Row Admission (Discharged) [facility]21[facility]20[facility] Admission (Discharged) [facility] Ad[facility] (Discharged) [facility] Admission (Discharged) [facility] Admission (Discharged) [facility]  Alcohol  Use Disorder Identification Test Final Score (AUDIT) 15 29 4 17 6       GAD-7    Flowsheet Row Counselor from 10/29/2023 in [facility]  Total GAD-7 Score 10      PHQ2-9    Flowsheet Row ED from 01/10/2024 in [facility] Counselor from 10/29/2023 in [facility] ED from 10/24/2023 in [facility]  PHQ-2 Total Score 6 6 0  PHQ-9 Total Score 18 17 11       Flowsheet Row ED from 01/10/2024 in [facility] Most recent reading at 01/11/2024 12:20 AM ED from 01/10/2024 in [facility] Most recent reading at 01/10/2024 10:42 PM ED to Hosp-Admission (Discharged)  from 11/26/2023 in [facility] 1C MEDICAL TELEMETRY Most recent reading at 11/26/2023  2:52 AM  C-SSRS RISK CATEGORY No Risk No Risk No Risk        Musculoskeletal  Strength & Muscle Tone: within normal limits Gait & Station: normal Patient leans: N/A  Psychiatric Specialty Exam  Presentation  General Appearance:  Casual  Eye Contact: Fair  Speech: Clear and Coherent  Speech Volume: Decreased  Handedness: Right   Mood and Affect  Mood: Hopeful, positive  Affect: Congruent   Thought Process  Thought Processes: Coherent  Descriptions of Associations:Circumstantial  Orientation:Full (Time, Place and Person)  Thought Content:WDL  Diagnosis of Schizophrenia or Schizoaffective disorder in past: No    Hallucinations: None Ideas of Reference:None  Suicidal Thoughts: None Homicidal Thoughts: None  Sensorium  Memory: Immediate Fair  Judgment: Poor  Insight: Fair   Chartered Certified Accountant: Fair  Attention Span: Fair  Recall: [employer] of Knowledge: Fair  Language: Fair   Psychomotor Activity  Psychomotor Activity: Normal  Assets  Assets: Desire for Improvement; Resilience   Sleep  Sleep: Good  Physical Exam  Constitutional:      Appearance: the patient is not toxic-appearing.  Pulmonary:     Effort: Pulmonary effort is normal.  Neurological:     General: No focal deficit present.     Mental Status: the patient is alert and oriented to person, place, and time.   Review of Systems  Respiratory:  Negative for shortness of breath.   Cardiovascular:  Negative for chest pain.  Gastrointestinal:  Negative for abdominal pain, constipation, diarrhea, nausea and vomiting.  Neurological:  Negative for headaches.   Blood pressure 101/69, pulse (!) 105, temperature 97.9 F (36.6 C), temperature source Oral, resp. rate 16, SpO2 98%. There is no height or weight on file to calculate BMI.  Treatment  Plan Summary: Daily contact with patient to assess and evaluate symptoms and progress in treatment, Medication management, and Plan    Ms. Dennis Killilea is a 58 yo female with a past psychiatric history of alcohol use disorder, MDD, tobacco use disorder, multiple inpatient psychiatric hospitalizations (most recent [facility] 11/2023), multiple rehab stays (most recent [place]  rehab last July-September) who presents to the [facility] requesting detox from alcohol.   Patient meets criteria for alcohol use disorder especially given past psychiatric history, history of rehab. Given BAL 329 on admission symptoms will start taper, although do not expect she will need much detox considering she has only consumed alcohol 3x since she detoxed in December. Most recent CIWA 4 (anxiety). Will continue to monitor for sx of withdrawal. Also reporting tobacco use, will give NRT. Prior history of MDD but non-compliant with medications, will restart  gabapentin  for withdrawal and anxiety. Recommending patient is wanting to do CDIOP.   Labs notable for TG 494, Hgb 15.3. Repeat TSH 0.78. UDS +BZD. Alcohol 329. Cr, AST/ALT 136/56. Qtc 46402/06/25    #Alcohol use disorder -CIWA -continue ativan  taper  -PRN ativan  for CIWA >10  -PRN: atarax , tylenol , maalox, imodium , milk of mg, zofran  -daily MV, thiamine   -continue gabapentin  100 TID for anxiety, alcohol withdrawal   #Substance-induced mood d/o Resume prozac  20mg  po daily   #Tobacco use disorder  -nicotine  patch    #Low TSH, resolved    #Asthma  -albuterol  PRN -home anoro ellipta    #Hypertriglyceridemia -Follow up with PCP. At risk for pancreatitis.    Dispo: D/c home tomorrow, CDIOP appointment to be setup.  Majel GORMAN Ramp, FNP,  01/11/2024 11:02 AM

## 2024-01-11 NOTE — ED Notes (Signed)
 Pt is in the bedroom calm and sleeping. NAD Respirations are even and unlabored. Will continue to monitor for safety.

## 2024-01-11 NOTE — Care Management (Addendum)
 [facility] Care Management   Writer referred patient to [facility].   Per the NP, the patient would now like to participate in the CD-IOP program.  Writer submitted a referral for an intake appointment.

## 2024-01-11 NOTE — [facility] Group Notes (Signed)
Spirituality group facilitated by Chaplain Orlene Salmons, MDiv, BCC.  Group Description:  Group focused on topic of hope.  Patients participated in facilitated discussion around topic, connecting with one another around experiences and definitions for hope.  Group members engaged with visual explorer photos, reflecting on what hope looks like for them today.  Group engaged in discussion around how their definitions of hope are present today in hospital.   Modalities: Psycho-social ed, Adlerian, Narrative, MI Patient Progress: DID NOT ATTEND  

## 2024-01-11 NOTE — ED Notes (Signed)
 Pt presents to [facility] to get help with Alcohol abuse.  Pt crying about she coming back again, writer assured pt that, we are here to help again and glad to have her. Admission process completed. Pt is oriented to the unit and provided with meal. . Pt denies HI/AVH. Will continue to monitor for safety and provide support.

## 2024-01-11 NOTE — Discharge Instructions (Addendum)
 You have been scheduled to start CDIOP starting 01/16/24 starting at 9 AM.  Based on the information that you have provided and the presenting issues outpatient services and resources for have been recommended.  It is imperative that you follow through with treatment recommendations within 5-7 days from the of discharge to mitigate further risk to your safety and mental well-being. A list of referrals has been provided below to get you started.  You are not limited to the list provided.  In case of an urgent crisis, you may contact the Mobile Crisis Unit with [facility] at [phone].               CD_IOP Program in [facility]   [facility] [address] [place], [place] [address] [phone]      [facility] [address] [place], [place] [address]  [phone]    [facility] [address] [place], [place] [address]  [phone]    [facility] [address]  [place], [place] [address] [phone]     Al[facility] Drug Services  [address] [place] , [address] [phone]   [facility] [address] [place], [place]  [address] [phone] [facility] Pay only)   [facility] [address] [place], [place] , [address] [phone]   [facility] [address] [place], [place] , [address] [phone]   [facility] [address] [place], [place] , [address] [phone]

## 2024-01-12 DIAGNOSIS — F10129 Alcohol abuse with intoxication, unspecified: Secondary | ICD-10-CM | POA: Diagnosis not present

## 2024-01-12 MED ORDER — ACAMPROSATE CALCIUM 333 MG PO TBEC: 666.0000 mg | DELAYED_RELEASE_TABLET | Freq: Three times a day (TID) | ORAL | 0 refills | Status: DC

## 2024-01-12 MED ORDER — NICOTINE 14 MG/24HR TD PT24: 14.0000 mg | MEDICATED_PATCH | Freq: Every day | TRANSDERMAL | 0 refills | Status: AC | PRN

## 2024-01-12 MED ORDER — CLONIDINE HCL 0.1 MG PO TABS: 0.1000 mg | ORAL_TABLET | Freq: Two times a day (BID) | ORAL | 0 refills | Status: DC | PRN

## 2024-01-12 MED ORDER — BUSPIRONE HCL 15 MG PO TABS: 7.5000 mg | ORAL_TABLET | Freq: Two times a day (BID) | ORAL | 0 refills | Status: DC

## 2024-01-12 MED ORDER — FLUOXETINE HCL 20 MG PO CAPS: 20.0000 mg | ORAL_CAPSULE | Freq: Every day | ORAL | 0 refills | Status: DC

## 2024-01-12 MED ORDER — NALTREXONE HCL 50 MG PO TABS: 25.0000 mg | ORAL_TABLET | Freq: Every day | ORAL | 0 refills | Status: AC

## 2024-01-12 MED ORDER — CLONIDINE HCL 0.1 MG PO TABS
0.1000 mg | ORAL_TABLET | Freq: Once | ORAL | Status: AC
  Administered 2024-01-12: 0.1 mg via ORAL
  Filled 2024-01-12: qty 1

## 2024-01-12 MED ORDER — GABAPENTIN 100 MG PO CAPS: 100.0000 mg | ORAL_CAPSULE | Freq: Three times a day (TID) | ORAL | 0 refills | Status: DC

## 2024-01-12 MED ORDER — HYDROXYZINE HCL 25 MG PO TABS: 25.0000 mg | ORAL_TABLET | Freq: Four times a day (QID) | ORAL | 0 refills | Status: AC | PRN

## 2024-01-12 NOTE — ED Notes (Signed)
 Patient is in the bedroom calm and sleeping. NAD. Respirations are even and unlabored. Will continue to monitor for safety.

## 2024-01-12 NOTE — ED Provider Notes (Signed)
 FBC/OBS ASAP Discharge Summary  Date and Time: 01/12/2024 12:53 PM  Name: [name]  MRN:  [id number]   Discharge Diagnoses:  Final diagnoses:  Alcohol abuse with intoxication (HCC)  Recurrent major depressive disorder, remission status unspecified (HCC)  Ineffective coping    Subjective: Patient seen and assessed at bedside.  Denies SI/HI/AVH.  Denies any acute alcohol withdrawal symptoms today.  Reports eating and sleeping well.  Reports eagerness to discharge home.  Patient has  Stay Summary [name] is a 58 yo female with a past psychiatric history of alcohol use disorder, MDD, tobacco use disorder, multiple inpatient psychiatric hospitalizations (most recent [facility] 11/2023), multiple rehab stays (most recent [place]  rehab last July-September) who presents to the [facility] requesting detox from alcohol.   Patient was admitted for alcohol detox and was started on scheduled lorazepam  taper.  Overall, patient tolerated this well.  Patient's relapse on alcohol as well as exacerbation of depression appears to be secondary to running out of psychotropic medication after being unable to reach her outpatient psychiatrist for medication refill.  Patient will be attending [facility] IOP starting to 1225 in order to better address her alcohol use disorder.  She was started on naltrexone  to aid with medication assisted treatment of alcohol use disorder.  Patient fluoxetine , buspirone  gabapentin , hydroxyzine  and acamprosate  were all restarted.    Total Time spent with patient: 30 minutes  Past Psychiatric History:  Past psychiatric history of MDD and GAD. She reports she was on prozac , lamictal , gabapentin  and has also been on acamprosate  in the past. She reports she was unable to get appointment with outpatient psychiatrist (Dr. Shirline) for the past month to refill medications. She reports she was on prozac  80, gabapentin  600 BID, lamictal  100 BID. Not on propranolol . Per chart review, patient has had at  least 5 psychiatric hospitalizations (10/2015, 12/2015, 01/2021, 12/2022, 11/2023). Most recent at [facility] 11/2023 for suicidal ideations. She has had prior medication trials including prozac , buspar , gabapentin , proprnaolol, lexapro , viibyrd, zoloft , lamictal , campral , naltrexone  (makes her nauseous), ambien , amphetamine . She has been non-compliant with medications.    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   History of anemia    Severe episode of recurrent major depressive disorder, without psychotic features (HCC) 01/07/2020   Sleep apnea    mild   SUI (stress urinary incontinence, female)     Past Surgical History:  Procedure Laterality Date   BLADDER SUSPENSION N/A 02/13/2013   Procedure: [facility] SLING;  Surgeon: Norleen JINNY Seltzer, MD;  Location: [facility];  Service: Urology;  Laterality: N/A;   CESAREAN SECTION  12-23-2004    W/ LEFT TUBAL LIGATION AND RIGHT SALPINGECTOMY   CESAREAN SECTION  1995   CYSTOSCOPY WITH STENT PLACEMENT Bilateral 06/16/2020   Procedure: CYSTOSCOPY WITH FIREFLY INJECTION;  Surgeon: Carolee Sherwood JONETTA DOUGLAS, MD;  Location: [facility];  Service: Urology;  Laterality: Bilateral;   IR RADIOLOGIST EVAL & MGMT  04/21/2020   IR RADIOLOGIST EVAL & MGMT  05/12/2020   ORIF HUMERUS FRACTURE Right 04/28/2014   Procedure:  OPEN REDUCTION INTERNAL FIXATION (ORIF) PROXIMAL HUMERUS FRACTURE;  Surgeon: Eva Elsie Herring, MD;  Location: [facility] OR;  Service: Orthopedics;  Laterality: Right;   ORIF PROXIMAL HUMERUS FRACTURE Right 04/28/2014   DR CHANDLER   PROCTOSCOPY N/A 06/16/2020   Procedure: RIGID  PROCTOSCOPY;  Surgeon: Sheldon Standing, MD;  Location: [facility];  Service: General;  Laterality: N/A;   TUBAL LIGATION     Family History:  Family History  Adopted: Yes   Social History:  Social History   Substance and Sexual Activity  Alcohol Use Yes   Alcohol/week: 2.0 standard drinks of alcohol   Types: 2 Glasses of wine per week   Comment: 12/29/20 restarted      Social History   Substance and Sexual Activity  Drug Use No    Social History   Socioeconomic History   Marital status: Married    Spouse name: Not on file   Number of children: Not on file   Years of education: Not on file   Highest education level: Not on file  Occupational History   Not on file  Tobacco Use   Smoking status: Every Day    Current packs/day: 1.00    Average packs/day: 0.4 packs/day for 23.1 years (8.1 ttl pk-yrs)    Types: Cigarettes    Start date: 09/24/1999    Last attempt to quit: 09/24/2019   Smokeless tobacco: Never  Vaping Use   Vaping status: Every Day   Substances: Nicotine   Substance and Sexual Activity   Alcohol use: Yes    Alcohol/week: 2.0 standard drinks of alcohol    Types: 2 Glasses of wine per week    Comment: 12/29/20 restarted    Drug use: No   Sexual activity: Not Currently    Birth control/protection: Post-menopausal  Other Topics Concern   Not on file  Social History Narrative   Not on file   Social Drivers of Health   Financial Resource Strain: Low Risk  (12/04/2023)   Received from [facility]   Overall Financial Resource Strain (CARDIA)    Difficulty of Paying Living Expenses: Not hard at all  Food Insecurity: Food Insecurity Present (01/11/2024)   Hunger Vital Sign    Worried About Running Out of Food in the Last Year: Sometimes true    Ran Out of Food in the Last Year: Often true  Transportation Needs: No Transportation Needs (01/11/2024)   PRAPARE - Administrator, Civil Service (Medical): No    Lack of Transportation  (Non-Medical): No  Physical Activity: Not on file  Stress: Not on file  Social Connections: Not on file   SDOH:  SDOH Screenings   Food Insecurity: Food Insecurity Present (01/11/2024)  Housing: Low Risk  (12/04/2023)   Received from [facility]  Recent Concern: Housing - Medium Risk (10/24/2023)  Transportation Needs: No Transportation Needs (01/11/2024)  Utilities: At Risk (01/11/2024)  Alcohol Screen: Medium Risk (08/22/2023)  Depression (PHQ2-9): High Risk (01/12/2024)  Financial Resource Strain: Low Risk  (12/04/2023)   Received from [facility]  Tobacco Use: High Risk (01/11/2024)    Tobacco Cessation:  A prescription for an FDA-approved tobacco cessation medication provided at discharge  Current Medications:  Current Facility-Administered Medications  Medication Dose Route Frequency Provider Last Rate Last Admin   acetaminophen  (TYLENOL ) tablet 650 mg  650 mg Oral Q6H PRN Trudy Carwin, NP   650 mg at 01/11/24 0920   albuterol  (VENTOLIN  HFA) 108 (90 Base) MCG/ACT  inhaler 1-2 puff  1-2 puff Inhalation Q6H PRN Starkes-Perry, Takia S, FNP   2 puff at 01/12/24 0904   alum & mag hydroxide-simeth (MAALOX/MYLANTA) 200-200-20 MG/5ML suspension 30 mL  30 mL Oral Q4H PRN Trudy Carwin, NP       budesonide  (PULMICORT ) nebulizer solution 0.5 mg  0.5 mg Inhalation Daily Starkes-Perry, Majel RAMAN, FNP       busPIRone  (BUSPAR ) tablet 7.5 mg  7.5 mg Oral BID Starkes-Perry, Takia S, FNP   7.5 mg at 01/12/24 9095   cloNIDine  (CATAPRES ) tablet 0.1 mg  0.1 mg Oral BID PRN Starkes-Perry, Takia S, FNP   0.1 mg at 01/12/24 0033   FLUoxetine  (PROZAC ) capsule 20 mg  20 mg Oral Daily Wilkie Majel RAMAN, FNP   20 mg at 01/12/24 9095   fluticasone  furoate-vilanterol (BREO ELLIPTA ) 100-25 MCG/ACT 1 puff  1 puff Inhalation Daily Starkes-Perry, Majel RAMAN, FNP       And   umeclidinium bromide  (INCRUSE ELLIPTA ) 62.5 MCG/ACT 1 puff  1 puff Inhalation Daily Starkes-Perry, Majel RAMAN, FNP        hydrOXYzine  (ATARAX ) tablet 25 mg  25 mg Oral Q6H PRN Trudy Carwin, NP       hydrOXYzine  (ATARAX ) tablet 25 mg  25 mg Oral Q6H PRN Starkes-Perry, Takia S, FNP   25 mg at 01/11/24 2116   loperamide  (IMODIUM ) capsule 2-4 mg  2-4 mg Oral PRN Trudy Carwin, NP       LORazepam  (ATIVAN ) tablet 1 mg  1 mg Oral Q6H PRN Trudy Carwin, NP       LORazepam  (ATIVAN ) tablet 1 mg  1 mg Oral TID Trudy Carwin, NP   1 mg at 01/12/24 9095   Followed by   LORazepam  (ATIVAN ) tablet 1 mg  1 mg Oral BID Trudy Carwin, NP       Followed by   NOREEN ON 01/14/2024] LORazepam  (ATIVAN ) tablet 1 mg  1 mg Oral Daily Trudy Carwin, NP       magnesium  hydroxide (MILK OF MAGNESIA) suspension 30 mL  30 mL Oral Daily PRN Trudy Carwin, NP       multivitamin with minerals tablet 1 tablet  1 tablet Oral Daily Trudy Carwin, NP   1 tablet at 01/12/24 9095   nicotine  (NICODERM CQ  - dosed in mg/24 hours) patch 14 mg  14 mg Transdermal Daily PRN Starkes-Perry, Takia S, FNP       OLANZapine  (ZYPREXA ) injection 10 mg  10 mg Intramuscular TID PRN Trudy Carwin, NP       OLANZapine  (ZYPREXA ) injection 5 mg  5 mg Intramuscular TID PRN Trudy Carwin, NP       OLANZapine  zydis (ZYPREXA ) disintegrating tablet 5 mg  5 mg Oral TID PRN Trudy Carwin, NP       ondansetron  (ZOFRAN -ODT) disintegrating tablet 4 mg  4 mg Oral Q6H PRN Trudy Carwin, NP       thiamine  (VITAMIN B1) tablet 100 mg  100 mg Oral Daily Trudy Carwin, NP   100 mg at 01/12/24 9095   Current Outpatient Medications  Medication Sig Dispense Refill   albuterol  (ACCUNEB ) 1.25 MG/3ML nebulizer solution Take 1 ampule by nebulization 3 (three) times daily as needed for wheezing or shortness of breath.     albuterol  (VENTOLIN  HFA) 108 (90 Base) MCG/ACT inhaler Inhale 1-2 puffs into the lungs every 6 (six) hours as needed for wheezing.     budesonide  (PULMICORT ) 0.5 MG/2ML nebulizer solution Inhale 0.5 mg into the lungs daily.  DUPIXENT 300 MG/2ML SOAJ Inject 300 mg into the  skin every 14 (fourteen) days.     folic acid  (FOLVITE ) 1 MG tablet Take 1 mg by mouth daily.     [Paused] lamoTRIgine  (LAMICTAL ) 100 MG tablet Take 50 mg by mouth 2 (two) times daily.     naltrexone  (DEPADE) 50 MG tablet Take 0.5 tablets (25 mg total) by mouth at bedtime. 15 tablet 0   progesterone  (PROMETRIUM ) 100 MG capsule Take 100-200 mg by mouth at bedtime.     SUMAtriptan  (IMITREX ) 100 MG tablet Take 1 tablet (100 mg total) by mouth every 2 (two) hours as needed for migraine. May repeat in 2 hours if headache persists or recurs. Do not exceed 2 tablets within a 24 hour period. 10 tablet 0   thiamine  (VITAMIN B-1) 100 MG tablet Take 1 tablet (100 mg total) by mouth daily. 30 tablet 0   TRELEGY ELLIPTA 100-62.5-25 MCG/ACT AEPB Inhale 1 puff into the lungs daily.     acamprosate  (CAMPRAL ) 333 MG tablet Take 2 tablets (666 mg total) by mouth 3 (three) times daily. 180 tablet 0   busPIRone  (BUSPAR ) 15 MG tablet Take 0.5 tablets (7.5 mg total) by mouth 2 (two) times daily. 30 tablet 0   cloNIDine  (CATAPRES ) 0.1 MG tablet Take 1 tablet (0.1 mg total) by mouth 2 (two) times daily as needed (For anxiety). 60 tablet 0   FLUoxetine  (PROZAC ) 20 MG capsule Take 1 capsule (20 mg total) by mouth daily. 30 capsule 0   gabapentin  (NEURONTIN ) 100 MG capsule Take 1 capsule (100 mg total) by mouth 3 (three) times daily. 90 capsule 0   hydrOXYzine  (ATARAX ) 25 MG tablet Take 1 tablet (25 mg total) by mouth every 6 (six) hours as needed (For anxiety or agitation). 90 tablet 0   nicotine  (NICODERM CQ  - DOSED IN MG/24 HOURS) 14 mg/24hr patch Place 1 patch (14 mg total) onto the skin daily as needed (For smoking cessation). 28 patch 0   triamcinolone  cream (KENALOG ) 0.5 % Apply topically 2 (two) times daily. (Patient not taking: Reported on 01/11/2024) 30 g 0    PTA Medications: (Not in a hospital admission)       01/12/2024   12:47 PM 01/10/2024   11:14 PM 10/29/2023    3:24 PM  Depression screen PHQ 2/9   Decreased Interest 3 3 3   Down, Depressed, Hopeless 3 3 3   PHQ - 2 Score 6 6 6   Altered sleeping 3 3 3   Tired, decreased energy 3 3 2   Change in appetite 3 3 3   Feeling bad or failure about yourself  3 3 2   Trouble concentrating 0 0 1  Moving slowly or fidgety/restless 0 0 0  Suicidal thoughts 0 0 0  PHQ-9 Score 18 18 17     Flowsheet Row ED from 01/10/2024 in [facility] Most recent reading at 01/11/2024 12:20 AM ED from 01/10/2024 in [facility] Most recent reading at 01/10/2024 10:42 PM ED to Hosp-Admission (Discharged) from 11/26/2023 in [facility] 1C MEDICAL TELEMETRY Most recent reading at 11/26/2023  2:52 AM  C-SSRS RISK CATEGORY No Risk No Risk No Risk       Musculoskeletal  Strength & Muscle Tone: within normal limits Gait & Station: normal Patient leans: N/A  Psychiatric Specialty Exam  Presentation  General Appearance:  Casual   Eye Contact: Fair   Speech: Clear and Coherent   Speech Volume: Decreased   Handedness: Right  Mood and Affect  Mood: Anxious   Affect: Flat    Thought Process  Thought Processes: Coherent   Descriptions of Associations:Circumstantial   Orientation:Full (Time, Place and Person)   Thought Content:WDL      Hallucinations:No data recorded  Ideas of Reference:None   Suicidal Thoughts:No data recorded  Homicidal Thoughts:No data recorded   Sensorium  Memory: Immediate Fair   Judgment: Poor   Insight: Fair    Chartered Certified Accountant: Fair   Attention Span: Fair   Recall: [employer] of Knowledge: Fair   Language: Fair    Psychomotor Activity  Psychomotor Activity:No data recorded   Assets  Assets: Desire for Improvement; Resilience    Sleep  Sleep:No data recorded   No data recorded   Physical Exam  Physical Exam ROS Blood pressure (!) 159/92, pulse 98, temperature  98.2 F (36.8 C), temperature source Oral, resp. rate 18, SpO2 97%. There is no height or weight on file to calculate BMI.  Demographic Factors:  Caucasian and Living alone  Loss Factors: NA  Historical Factors: NA  Risk Reduction Factors:   Positive social support, Positive therapeutic relationship, and Positive coping skills or problem solving skills  Continued Clinical Symptoms:  Alcohol/Substance Abuse/Dependencies  Cognitive Features That Contribute To Risk:  None    Suicide Risk:  Minimal: No identifiable suicidal ideation.  Patients presenting with no risk factors but with morbid ruminations; may be classified as minimal risk based on the severity of the depressive symptoms  Plan Of Care/Follow-up recommendations:   Follow-up recommendations:   Activity:  as tolerated Diet:  heart healthy   Comments:  Prescriptions were given at discharge.  Patient is agreeable with the discharge plan.  Patient was given an opportunity to ask questions.  Patient appears to feel comfortable with discharge and denies any current suicidal or homicidal thoughts.    Patient is instructed prior to discharge to: Take all medications as prescribed by mental healthcare provider. Report any adverse effects and or reactions from the medicines to outpatient provider promptly. In the event of worsening symptoms, patient is instructed to call the crisis hotline, 911 and or go to the nearest ED for appropriate evaluation and treatment of symptoms. Patient is to follow-up with primary care provider for other medical issues, concerns and or health care needs.   Prentice Espy, MD 01/12/2024, 12:53 PM

## 2024-01-12 NOTE — ED Notes (Signed)
 BP high this morning, provider notified. Ix dose of clonidine  ordered. Will continue to monitor.

## 2024-01-12 NOTE — ED Notes (Signed)
 Patient A&Ox4. Denies intent to harm self/others when asked. Denies A/VH. Patient denies any physical complaints when asked. No acute distress noted. Support and encouragement provided. Routine safety checks conducted according to facility protocol. Encouraged patient to notify staff if thoughts of harm toward self or others arise. Patient verbalize understanding and agreement. Will continue to monitor for safety.

## 2024-01-12 NOTE — ED Notes (Signed)
 Patient A&O x 4, ambulatory. Patient discharged in no acute distress. Patient denied SI/HI, A/VH upon discharge. Patient verbalized understanding of all discharge instructions reviewed on AVS via staff, to include follow up appointments, RX's and safety. Suicide safety plan completed and reviewed with Clinical research associate. A copy given to pt. Pt belongings returned to patient from locker intact. Patient escorted to lobby via staff for transport to destination. Safety maintained.

## 2024-01-12 NOTE — ED Notes (Signed)
 Pt has BP of 140/94 during CIWA assessment. Manual BP done 120/90. P.o Clonidine  as ordered given. Provider notified

## 2024-01-12 NOTE — Progress Notes (Signed)
 Meal given to pt.

## 2024-01-16 ENCOUNTER — Ambulatory Visit (HOSPITAL_COMMUNITY): Payer: 59

## 2024-01-16 ENCOUNTER — Telehealth (HOSPITAL_COMMUNITY): Payer: Self-pay | Admitting: Licensed Clinical Social Worker

## 2024-01-16 ENCOUNTER — Encounter (HOSPITAL_COMMUNITY): Payer: Self-pay | Admitting: Licensed Clinical Social Worker

## 2024-01-16 NOTE — Telephone Encounter (Signed)
This therapist attempts to reach [facility] by phone; however, her contact number appears to not be in service. Thus, this therapist sends her a correspondence via MyChart.  Myrna Blazer, MA, LCSW, [facility], LCAS 01/16/2024

## 2024-01-18 ENCOUNTER — Ambulatory Visit (HOSPITAL_COMMUNITY): Payer: 59

## 2024-01-21 ENCOUNTER — Ambulatory Visit (HOSPITAL_COMMUNITY): Payer: 59

## 2024-01-23 ENCOUNTER — Ambulatory Visit (HOSPITAL_COMMUNITY): Payer: 59

## 2024-01-25 ENCOUNTER — Ambulatory Visit (HOSPITAL_COMMUNITY): Payer: 59

## 2024-01-28 ENCOUNTER — Ambulatory Visit (HOSPITAL_COMMUNITY): Payer: 59

## 2024-01-30 ENCOUNTER — Ambulatory Visit (HOSPITAL_COMMUNITY): Payer: 59

## 2024-02-01 ENCOUNTER — Ambulatory Visit (HOSPITAL_COMMUNITY): Payer: 59

## 2024-02-01 ENCOUNTER — Telehealth (HOSPITAL_COMMUNITY): Payer: Self-pay

## 2024-02-01 NOTE — Telephone Encounter (Signed)
 I am not patient's OP provider so will be unable to fill. She needs to follow up with her outpatient PCP/psychiatrist for refill.

## 2024-02-01 NOTE — Telephone Encounter (Signed)
 Hello,     Pt is requesting for a refill of 90.0 hydrOXYzine (ATARAX) 25 MG tablet  to be sent in.

## 2024-02-04 ENCOUNTER — Ambulatory Visit (HOSPITAL_COMMUNITY): Payer: 59

## 2024-02-06 ENCOUNTER — Ambulatory Visit (HOSPITAL_COMMUNITY): Payer: 59

## 2024-02-08 ENCOUNTER — Ambulatory Visit (HOSPITAL_COMMUNITY): Payer: 59

## 2024-05-14 ENCOUNTER — Telehealth (INDEPENDENT_AMBULATORY_CARE_PROVIDER_SITE_OTHER): Admitting: Adult Health

## 2024-05-14 ENCOUNTER — Encounter: Payer: Self-pay | Admitting: Adult Health

## 2024-05-14 DIAGNOSIS — F331 Major depressive disorder, recurrent, moderate: Secondary | ICD-10-CM

## 2024-05-14 DIAGNOSIS — F1020 Alcohol dependence, uncomplicated: Secondary | ICD-10-CM | POA: Diagnosis not present

## 2024-05-14 DIAGNOSIS — F411 Generalized anxiety disorder: Secondary | ICD-10-CM

## 2024-05-14 MED ORDER — BUPROPION HCL ER (XL) 150 MG PO TB24: 150.0000 mg | ORAL_TABLET | Freq: Every day | ORAL | 2 refills | Status: AC

## 2024-05-14 MED ORDER — FLUOXETINE HCL 40 MG PO CAPS: 40.0000 mg | ORAL_CAPSULE | Freq: Every day | ORAL | 2 refills | Status: DC

## 2024-05-14 NOTE — Progress Notes (Signed)
 [facility] MD/PA/NP Initial Note  05/14/2024 3:38 PM [name]  MRN:  [id number]  Virtual Visit via Video Note  I connected with pt @ on 05/14/24 at  3:00 PM EDT by a video enabled telemedicine application and verified that I am speaking with the correct person using two identifiers.   I discussed the limitations of evaluation and management by telemedicine and the availability of in person appointments. The patient expressed understanding and agreed to proceed.  I discussed the assessment and treatment plan with the patient. The patient was provided an opportunity to ask questions and all were answered. The patient agreed with the plan and demonstrated an understanding of the instructions.   The patient was advised to call back or seek an in-person evaluation if the symptoms worsen or if the condition fails to improve as anticipated.  I provided 60 minutes of non-face-to-face time during this encounter.  The patient was located at home.  The provider was located at [facility].  Reagan Camera, NP   Chief Complaint:   HPI:   Patient seen today for initial psychiatric evaluation.   Referral from [facility] - was inpatient PHP - alcohol detox.  Describes mood today as ok. Pleasant. Denies tearfulness. Mood symptoms - denies depression, anxiety and irritability. Reports stable interest and motivation. Denies panic attacks. Denies worry, rumination and over thinking. Denies obsessive thoughts or acts. Reports mood as stable. Stating I feel like Im doing ok.Taking medications as prescribed. Reports recent discharge from [facility] - March 7th through April 6th for alcohol detoxification. Was then referred to [facility] - for 2 weeks. She then started IOP - was in program for 8 weeks and her last day is tomorrow. She plans to join an early recovery group - a 12 weeks program.  Energy levels stable. Active, does not have a regular exercise routine.  Enjoys some usual  interests and activities. Single. Lives alone - rents a house. Has a son and a daughter and other family in the area. Spending time with family. Appetite adequate. Weight loss- 139.9 pounds - [employer] Sleeps well most nights. Averages 7 to 8 hours. Focus and concentration is ok. Reports ADD diagnosed in childhood - prescribed several medications over the years and doesn't like the way it makes her feel. Completing tasks. Managing aspects of household. Working full time as a Comptroller. Denies self harm. Denies substance use. Denies SI or HI.  Denies AH or VH.  Previous medication trials:  Lithium , Latuda, Zoloft , Lexapro   Visit Diagnosis:    ICD-10-CM   1. Major depressive disorder, recurrent episode, moderate (HCC)  F33.1 FLUoxetine  (PROZAC ) 40 MG capsule    buPROPion (WELLBUTRIN XL) 150 MG 24 hr tablet    2. GAD (generalized anxiety disorder)  F41.1 FLUoxetine  (PROZAC ) 40 MG capsule    buPROPion (WELLBUTRIN XL) 150 MG 24 hr tablet    3. Alcohol use disorder, severe, dependence (HCC)  F10.20       Past Psychiatric History: Reports psychiatric hospitalization - has been hospitalized a few times.   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   History of anemia    Severe episode of recurrent major depressive disorder, without psychotic features (HCC) 01/07/2020   Sleep apnea    mild   SUI (stress urinary incontinence, female)     Past Surgical History:  Procedure Laterality Date   BLADDER SUSPENSION N/A 02/13/2013   Procedure: [facility];  Surgeon: Willye Harvey, MD;  Location: [facility];  Service: Urology;  Laterality: N/A;   CESAREAN SECTION  12-23-2004    W/ LEFT TUBAL LIGATION AND RIGHT SALPINGECTOMY   CESAREAN SECTION  1995   CYSTOSCOPY WITH STENT PLACEMENT Bilateral 06/16/2020   Procedure: CYSTOSCOPY WITH FIREFLY INJECTION;  Surgeon: Samson Croak, MD;  Location: [facility];  Service: Urology;  Laterality: Bilateral;   IR RADIOLOGIST EVAL & MGMT  04/21/2020   IR RADIOLOGIST EVAL & MGMT  05/12/2020   ORIF HUMERUS FRACTURE Right 04/28/2014   Procedure: OPEN REDUCTION INTERNAL FIXATION (ORIF) PROXIMAL HUMERUS FRACTURE;  Surgeon: Derald Flattery, MD;  Location: [facility];  Service: Orthopedics;  Laterality: Right;   ORIF PROXIMAL HUMERUS FRACTURE Right 04/28/2014   DR CHANDLER   PROCTOSCOPY N/A 06/16/2020   Procedure: RIGID PROCTOSCOPY;  Surgeon: Candyce Champagne, MD;  Location: [facility];  Service: General;  Laterality: N/A;   TUBAL LIGATION      Family Psychiatric History: Reports a family history of mental illness.   Family History:  Family History  Adopted: Yes    Social History:  Social History   Socioeconomic History   Marital status: Married    Spouse name: Not on file   Number of children: Not on file   Years of education: Not on file   Highest education level: Not on file  Occupational History   Not on file  Tobacco Use   Smoking status: Every Day    Current packs/day: 1.00    Average packs/day: 0.4 packs/day for 23.4 years (8.4 ttl pk-yrs)    Types: Cigarettes    Start date: 09/24/1999    Last attempt to quit: 09/24/2019   Smokeless tobacco: Never  Vaping Use   Vaping status: Every Day   Substances: Nicotine   Substance and Sexual Activity   Alcohol use: Yes    Alcohol/week: 2.0 standard drinks of alcohol    Types: 2 Glasses of wine per week    Comment: 12/29/20 restarted    Drug use: No   Sexual activity: Not Currently    Birth control/protection: Post-menopausal  Other Topics Concern   Not on file  Social History Narrative   Not on  file   Social Drivers of Health   Financial Resource Strain: Low Risk  (04/22/2024)   [facility]   Overall Financial Resource Strain (CARDIA)    Difficulty of Paying Living Expenses: Not hard at all  Food Insecurity: No Food Insecurity (04/22/2024)   Received from [facility]   Hunger Vital Sign    Worried About Running Out of Food in the Last Year: Never true    Ran Out of Food in the Last Year: Never true  Transportation Needs: No Transportation Needs (04/22/2024)   Received from [facility] - Transportation    In the past 12 months, has lack of transportation kept you from medical appointments or from getting medications?: No    Lack of Transportation (Non-Medical): No  Physical Activity: Not on file  Stress: Not on file  Social Connections: Not on file    Allergies:  Allergies  Allergen Reactions   Hydrocodone  Other (See Comments)    Dizzy; Tolerates oxycodone  fine    Metabolic Disorder Labs: Lab Results  Component Value Date   HGBA1C 5.2 10/23/2023   MPG 102.54 10/23/2023   MPG 102.54 12/25/2022   Lab Results  Component Value Date   PROLACTIN 9.7 10/23/2023   Lab Results  Component Value Date   CHOL 147 01/10/2024   TRIG 494 (H) 01/10/2024   HDL 79 01/10/2024   CHOLHDL 1.9 01/10/2024   VLDL UNABLE TO CALCULATE IF TRIGLYCERIDE OVER 400 mg/dL 91/47/8295   LDLCALC UNABLE TO CALCULATE IF TRIGLYCERIDE OVER 400 mg/dL 62/13/0865   LDLCALC 10 10/23/2023   Lab Results  Component Value Date   TSH 0.786 01/10/2024   TSH 1.265 11/26/2023    Therapeutic Level Labs: Lab Results  Component Value Date   LITHIUM  <0.06 (L) 11/23/2016   No results found for: VALPROATE No results found for: CBMZ  Current Medications: Current Outpatient Medications  Medication Sig Dispense Refill   buPROPion (WELLBUTRIN XL) 150 MG 24 hr tablet Take 1 tablet (150 mg total) by mouth daily. 30 tablet 2    acamprosate  (CAMPRAL ) 333 MG tablet Take 2 tablets (666 mg total) by mouth 3 (three) times daily. 180 tablet 0   albuterol  (ACCUNEB ) 1.25 MG/3ML nebulizer solution Take 1 ampule by nebulization 3 (three) times daily as needed for wheezing or shortness of breath.     albuterol  (VENTOLIN  HFA) 108 (90 Base) MCG/ACT inhaler Inhale 1-2 puffs into the lungs every 6 (six) hours as needed for wheezing.     budesonide  (PULMICORT ) 0.5 MG/2ML nebulizer solution Inhale 0.5 mg into the lungs daily.     busPIRone  (BUSPAR ) 15 MG tablet Take 0.5 tablets (7.5 mg total) by mouth 2 (two) times daily. 30 tablet 0   cloNIDine  (CATAPRES ) 0.1 MG tablet Take 1 tablet (0.1 mg total) by mouth 2 (two) times daily as needed (For anxiety). 60 tablet 0   DUPIXENT 300 MG/2ML SOAJ Inject 300 mg into the skin every 14 (fourteen) days.     FLUoxetine  (PROZAC ) 40 MG capsule Take 1 capsule (40 mg total) by mouth daily. 30 capsule 2   folic acid  (FOLVITE ) 1 MG tablet Take 1 mg by mouth daily.     gabapentin  (NEURONTIN ) 100 MG capsule Take 1 capsule (100 mg total) by mouth 3 (three) times daily. 90 capsule 0   hydrOXYzine  (ATARAX ) 25 MG tablet Take 1 tablet (25 mg total) by mouth every 6 (six) hours as needed (For anxiety or agitation). 90 tablet 0   lamoTRIgine  (LAMICTAL ) 100 MG tablet Take 50 mg by mouth 2 (two) times daily.     nicotine  (NICODERM CQ  - DOSED IN MG/24 HOURS) 14 mg/24hr patch Place 1 patch (14 mg total) onto the skin daily as needed (For smoking cessation). 28 patch 0   progesterone  (PROMETRIUM ) 100 MG capsule Take 100-200 mg by mouth at bedtime.     SUMAtriptan  (IMITREX ) 100 MG tablet Take 1 tablet (100 mg total) by mouth every 2 (two) hours as needed for migraine. May repeat in 2 hours if headache persists or recurs. Do not exceed 2 tablets within a 24 hour period. 10 tablet 0   thiamine  (VITAMIN B-1) 100 MG tablet Take 1 tablet (100 mg total) by mouth daily. 30 tablet 0   TRELEGY ELLIPTA 100-62.5-25 MCG/ACT AEPB  Inhale 1 puff into the lungs daily.  triamcinolone  cream (KENALOG ) 0.5 % Apply topically 2 (two) times daily. (Patient not taking: Reported on 01/11/2024) 30 g 0   No current facility-administered medications for this visit.    Medication Side Effects: none  Orders placed this visit:  No orders of the defined types were placed in this encounter.   Psychiatric Specialty Exam:  Review of Systems  Musculoskeletal:  Negative for gait problem.  Neurological:  Negative for tremors.  Psychiatric/Behavioral:         Please refer to HPI    There were no vitals taken for this visit.There is no height or weight on file to calculate BMI.  General Appearance: Casual, Neat, and Well Groomed  Eye Contact:  Good  Speech:  Clear and Coherent and Normal Rate  Volume:  Normal  Mood:  Euthymic  Affect:  Appropriate and Congruent  Thought Process:  Coherent and Descriptions of Associations: Intact  Orientation:  Full (Time, Place, and Person)  Thought Content: Logical   Suicidal Thoughts:  No  Homicidal Thoughts:  No  Memory:  WNL  Judgement:  Good  Insight:  Good  Psychomotor Activity:  Normal  Concentration:  Concentration: Good and Attention Span: Good  Recall:  Good  Fund of Knowledge: Good  Language: Good  Assets:  Communication Skills Desire for Improvement Financial Resources/Insurance Housing Intimacy Leisure Time Physical Health Resilience Social Support Talents/Skills Transportation Vocational/Educational  ADL's:  Intact  Cognition: WNL  Prognosis:  Good   Screenings:  AIMS    Flowsheet Row [facility]) [facility]) [facility]  AIMS Total Score 0 0 0 0       AUDIT    Flowsheet Row Admission (Discharged) from 08/21/2023 in [facility] Ad[facility] ([facility] (Dis[facility] ([facility]  Alcohol Use Disorder Identification Test Final Score (AUDIT) 15 29 4 17 6       GAD-7    Flowsheet Row Counselor from 10/29/2023 in [facility]  Total GAD-7 Score 10      PHQ2-9    Flowsheet Row ED from 01/10/2024 in [facility] Counselor from 10/29/2023 in [facility] ED from 10/24/2023 in [facility]  PHQ-2 Total Score 6 6 0  PHQ-9 Total Score 18 17 11       Flowsheet Row ED from 01/10/2024 in [facility] Most recent reading at 01/11/2024 12:20 AM ED from 01/10/2024 in [facility] Most recent reading at 01/10/2024 10:42 PM ED to Hosp-Admission (Discharged) from 11/26/2023 in [facility] 1C MEDICAL TELEMETRY Most recent reading at 11/26/2023  2:52 AM  C-SSRS RISK CATEGORY No Risk No Risk No Risk       Receiving Psychotherapy: Yes   Treatment Plan/Recommendations:  Plan:  PDMP reviewed  Lamictal  100mg  - 1/2 tablet twice daily Wellbutrin XL 150mg  daily Buspar  10mg  twice daily Prozac  40mg  daily Clonidine  0.1mg  twice daily Trazadone 50mg  daily as needed - takes infrequently Campral  333mg  - 2 tabs 3 times a day  RTC 4  weeks  60 minutes spent dedicated to the care of this patient on the date of this encounter to include pre-visit review of records, ordering of medication, post visit documentation, and face-to-face time with the patient discussing  depression, anxiety and alcohol use disorder. Discussed continuing current medication regimen.  Patient advised to contact office with any questions, adverse effects, or acute worsening in signs and symptoms.   Counseled patient regarding potential benefits, risks, and side effects of Lamictal  to include potential risk of Stevens-Johnson syndrome. Advised patient to stop taking Lamictal  and contact office immediately if rash develops and to seek urgent medical attention if rash is severe and/or spreading quickly.    Mitsue Peery N Aiyana Stegmann, NP

## 2024-07-16 ENCOUNTER — Other Ambulatory Visit: Payer: Self-pay | Admitting: Adult Health

## 2024-07-16 DIAGNOSIS — F331 Major depressive disorder, recurrent, moderate: Secondary | ICD-10-CM

## 2024-07-16 DIAGNOSIS — F411 Generalized anxiety disorder: Secondary | ICD-10-CM

## 2024-08-22 ENCOUNTER — Emergency Department (HOSPITAL_COMMUNITY)

## 2024-08-22 ENCOUNTER — Inpatient Hospital Stay (HOSPITAL_COMMUNITY)
Admission: EM | Admit: 2024-08-22 | Discharge: 2024-08-27 | DRG: 897 | Disposition: A | Discharge status: Home / Self Care | Attending: Internal Medicine | Admitting: Internal Medicine

## 2024-08-22 ENCOUNTER — Other Ambulatory Visit: Payer: Self-pay

## 2024-08-22 DIAGNOSIS — F32A Depression, unspecified: Secondary | ICD-10-CM | POA: Diagnosis present

## 2024-08-22 DIAGNOSIS — Z7951 Long term (current) use of inhaled steroids: Secondary | ICD-10-CM | POA: Diagnosis not present

## 2024-08-22 DIAGNOSIS — W1830XA Fall on same level, unspecified, initial encounter: Secondary | ICD-10-CM | POA: Diagnosis present

## 2024-08-22 DIAGNOSIS — F17210 Nicotine dependence, cigarettes, uncomplicated: Secondary | ICD-10-CM | POA: Diagnosis present

## 2024-08-22 DIAGNOSIS — E860 Dehydration: Secondary | ICD-10-CM | POA: Diagnosis present

## 2024-08-22 DIAGNOSIS — W19XXXA Unspecified fall, initial encounter: Principal | ICD-10-CM

## 2024-08-22 DIAGNOSIS — F10939 Alcohol use, unspecified with withdrawal, unspecified: Secondary | ICD-10-CM | POA: Diagnosis not present

## 2024-08-22 DIAGNOSIS — R000 Tachycardia, unspecified: Secondary | ICD-10-CM | POA: Diagnosis present

## 2024-08-22 DIAGNOSIS — S0083XA Contusion of other part of head, initial encounter: Secondary | ICD-10-CM | POA: Diagnosis present

## 2024-08-22 DIAGNOSIS — F10229 Alcohol dependence with intoxication, unspecified: Secondary | ICD-10-CM | POA: Diagnosis present

## 2024-08-22 DIAGNOSIS — Z9181 History of falling: Secondary | ICD-10-CM | POA: Diagnosis not present

## 2024-08-22 DIAGNOSIS — Y907 Blood alcohol level of 200-239 mg/100 ml: Secondary | ICD-10-CM | POA: Diagnosis present

## 2024-08-22 DIAGNOSIS — R296 Repeated falls: Secondary | ICD-10-CM | POA: Diagnosis present

## 2024-08-22 DIAGNOSIS — Z885 Allergy status to narcotic agent status: Secondary | ICD-10-CM | POA: Diagnosis not present

## 2024-08-22 DIAGNOSIS — J45909 Unspecified asthma, uncomplicated: Secondary | ICD-10-CM | POA: Diagnosis present

## 2024-08-22 DIAGNOSIS — R251 Tremor, unspecified: Secondary | ICD-10-CM | POA: Diagnosis present

## 2024-08-22 DIAGNOSIS — F419 Anxiety disorder, unspecified: Secondary | ICD-10-CM | POA: Diagnosis present

## 2024-08-22 DIAGNOSIS — F10239 Alcohol dependence with withdrawal, unspecified: Secondary | ICD-10-CM | POA: Diagnosis present

## 2024-08-22 DIAGNOSIS — F1010 Alcohol abuse, uncomplicated: Secondary | ICD-10-CM

## 2024-08-22 DIAGNOSIS — Z79899 Other long term (current) drug therapy: Secondary | ICD-10-CM | POA: Diagnosis not present

## 2024-08-22 LAB — COMPREHENSIVE METABOLIC PANEL WITH GFR
ALT: 14 U/L (ref 0–44)
AST: 28 U/L (ref 15–41)
Albumin: 4.3 g/dL (ref 3.5–5.0)
Alkaline Phosphatase: 120 U/L (ref 38–126)
Anion gap: 19 — ABNORMAL HIGH (ref 5–15)
BUN: 13 mg/dL (ref 6–20)
CO2: 22 mmol/L (ref 22–32)
Calcium: 8.6 mg/dL — ABNORMAL LOW (ref 8.9–10.3)
Chloride: 100 mmol/L (ref 98–111)
Creatinine, Ser: 0.56 mg/dL (ref 0.44–1.00)
GFR, Estimated: >60 mL/min (ref >60)
Glucose, Bld: 135 mg/dL — ABNORMAL HIGH (ref 70–99)
Potassium: 3.5 mmol/L (ref 3.5–5.1)
Sodium: 141 mmol/L (ref 135–145)
Total Bilirubin: 0.7 mg/dL (ref 0.0–1.2)
Total Protein: 6.3 g/dL — ABNORMAL LOW (ref 6.5–8.1)

## 2024-08-22 LAB — CBC WITH DIFFERENTIAL/PLATELET
Abs Immature Granulocytes: 0.03 K/uL (ref 0.00–0.07)
Basophils Absolute: 0.1 K/uL (ref 0.0–0.1)
Basophils Relative: 1 %
Eosinophils Absolute: 0.1 K/uL (ref 0.0–0.5)
Eosinophils Relative: 1 %
HCT: 47.8 % — ABNORMAL HIGH (ref 36.0–46.0)
Hemoglobin: 15.3 g/dL — ABNORMAL HIGH (ref 12.0–15.0)
Immature Granulocytes: 0 %
Lymphocytes Relative: 25 %
Lymphs Abs: 2.3 K/uL (ref 0.7–4.0)
MCH: 30.5 pg (ref 26.0–34.0)
MCHC: 32 g/dL (ref 30.0–36.0)
MCV: 95.4 fL (ref 80.0–100.0)
Monocytes Absolute: 0.3 K/uL (ref 0.1–1.0)
Monocytes Relative: 3 %
Neutro Abs: 6.3 K/uL (ref 1.7–7.7)
Neutrophils Relative %: 70 %
Platelets: 226 K/uL (ref 150–400)
RBC: 5.01 MIL/uL (ref 3.87–5.11)
RDW: 12.8 % (ref 11.5–15.5)
WBC: 9 K/uL (ref 4.0–10.5)
nRBC: 0 % (ref 0.0–0.2)

## 2024-08-22 LAB — ETHANOL: Alcohol, Ethyl (B): 229 mg/dL — ABNORMAL HIGH (ref <15)

## 2024-08-22 MED ORDER — FOLIC ACID 1 MG PO TABS
1.0000 mg | ORAL_TABLET | Freq: Every day | ORAL | Status: DC
  Administered 2024-08-23 – 2024-08-27 (×5): 1 mg via ORAL
  Filled 2024-08-22 (×5): qty 1

## 2024-08-22 MED ORDER — LORAZEPAM 2 MG/ML IJ SOLN
2.0000 mg | Freq: Once | INTRAMUSCULAR | Status: AC
  Administered 2024-08-22: 2 mg via INTRAVENOUS
  Filled 2024-08-22: qty 1

## 2024-08-22 MED ORDER — LACTATED RINGERS IV SOLN
INTRAVENOUS | Status: DC
Start: 2024-08-22 — End: 2024-08-22

## 2024-08-22 MED ORDER — POLYETHYLENE GLYCOL 3350 17 G PO PACK: 17.0000 g | PACK | Freq: Every day | ORAL | Status: DC | PRN

## 2024-08-22 MED ORDER — LACTATED RINGERS IV SOLN: INTRAVENOUS | Status: AC

## 2024-08-22 MED ORDER — PROCHLORPERAZINE EDISYLATE 10 MG/2ML IJ SOLN
5.0000 mg | Freq: Four times a day (QID) | INTRAMUSCULAR | Status: DC | PRN
  Administered 2024-08-23: 5 mg via INTRAVENOUS
  Filled 2024-08-22: qty 2

## 2024-08-22 MED ORDER — ACETAMINOPHEN 325 MG PO TABS
650.0000 mg | ORAL_TABLET | Freq: Four times a day (QID) | ORAL | Status: DC | PRN
  Administered 2024-08-22 – 2024-08-26 (×5): 650 mg via ORAL
  Filled 2024-08-22 (×5): qty 2

## 2024-08-22 MED ORDER — THIAMINE HCL 100 MG/ML IJ SOLN: 100.0000 mg | Freq: Every day | INTRAMUSCULAR | Status: DC

## 2024-08-22 MED ORDER — THIAMINE MONONITRATE 100 MG PO TABS
100.0000 mg | ORAL_TABLET | Freq: Every day | ORAL | Status: DC
  Administered 2024-08-23 – 2024-08-27 (×5): 100 mg via ORAL
  Filled 2024-08-22 (×5): qty 1

## 2024-08-22 MED ORDER — ENOXAPARIN SODIUM 40 MG/0.4ML IJ SOSY
40.0000 mg | PREFILLED_SYRINGE | INTRAMUSCULAR | Status: DC
  Administered 2024-08-23 – 2024-08-26 (×4): 40 mg via SUBCUTANEOUS
  Filled 2024-08-22 (×4): qty 0.4

## 2024-08-22 MED ORDER — MELATONIN 5 MG PO TABS
5.0000 mg | ORAL_TABLET | Freq: Every evening | ORAL | Status: DC | PRN
  Administered 2024-08-22: 5 mg via ORAL
  Filled 2024-08-22: qty 1

## 2024-08-22 MED ORDER — LORAZEPAM 1 MG PO TABS
1.0000 mg | ORAL_TABLET | ORAL | Status: DC | PRN
  Administered 2024-08-23: 2 mg via ORAL
  Filled 2024-08-22: qty 2

## 2024-08-22 MED ORDER — NICOTINE 14 MG/24HR TD PT24
14.0000 mg | MEDICATED_PATCH | Freq: Every day | TRANSDERMAL | Status: DC
Start: 2024-08-23 — End: 2024-08-27
  Administered 2024-08-23 – 2024-08-27 (×6): 14 mg via TRANSDERMAL
  Filled 2024-08-22 (×6): qty 1

## 2024-08-22 MED ORDER — LORAZEPAM 2 MG/ML IJ SOLN
1.0000 mg | INTRAMUSCULAR | Status: DC | PRN
  Administered 2024-08-22: 3 mg via INTRAVENOUS
  Administered 2024-08-23: 2 mg via INTRAVENOUS
  Filled 2024-08-22: qty 1
  Filled 2024-08-22: qty 2

## 2024-08-22 MED ORDER — ADULT MULTIVITAMIN W/MINERALS CH
1.0000 | ORAL_TABLET | Freq: Every day | ORAL | Status: DC
Start: 2024-08-23 — End: 2024-08-27
  Administered 2024-08-23 – 2024-08-27 (×5): 1 via ORAL
  Filled 2024-08-22 (×5): qty 1

## 2024-08-22 NOTE — ED Triage Notes (Signed)
 Patient presents from [facility] pre admission. This past Wednesday the patient fell and now has bruises in her buttock and the side of her head per EMS. ETOH on board. Before steeping into the EMS truck, the patient smoked. After smoking the patient experienced shortness of breath and wheezing. EMS administered albuterol , a duoneb, 125 mg of solumedrol, magnesium  and 4 mg of Zofran . EMS noted some improvement post treatment.   HX COPD, Asthma  EMS vitals: 93% SPO2 on room air 110s HR 147/86 BP

## 2024-08-22 NOTE — ED Notes (Signed)
Patient transferred to floor at this time.

## 2024-08-22 NOTE — ED Notes (Signed)
 Ambulatory HR 135 O2 94% Provider aware. Per provider patient will be admitted at this time.

## 2024-08-22 NOTE — ED Notes (Signed)
 ED TO INPATIENT HANDOFF REPORT  Name/Age/Gender [name] 58 y.o. female  Code Status Code Status History     Date Active Date Inactive Code Status Order ID Comments User Context   01/10/2024 2309 01/12/2024 1934 Full Code [id number]  Trudy Carwin, NP ED   11/26/2023 0744 11/28/2023 1731 Full Code [id number]  Laurita Cort DASEN, MD ED   10/24/2023 0026 10/26/2023 1824 Full Code [id number]  Elner Luisa NOVAK, NP ED   10/23/2023 1530 10/24/2023 0002 Full Code [id number]  Rankin, Shuvon B, NP ED   08/22/2023 0012 08/25/2023 1628 Full Code [id number]  Alan Gailen BROCKS, NP Inpatient   04/09/2023 2358 04/12/2023 2203 Full Code [id number]  Shona Terry SAILOR, DO ED   12/25/2022 2234 12/30/2022 1648 Full Code [id number]  Mardy Elveria DEL, NP Inpatient   12/25/2022 1637 12/25/2022 2213 Full Code [id number]  Mardy Elveria DEL, NP ED   01/05/2021 2315 01/11/2021 2056 Full Code [id number]  Wonda Clarita BRAVO, NP Inpatient   01/05/2021 0219 01/05/2021 2253 Full Code [id number]  McDonald, Mia A, PA-C ED   06/16/2020 1620 06/19/2020 1913 Full Code [id number]  Sheldon Standing, MD Inpatient   04/27/2020 1139 05/01/2020 1611 Full Code [id number]  Vicci Burnard SAUNDERS, PA-C ED   04/03/2020 1309 04/12/2020 1906 Full Code [id number]  Eletha Boas, MD ED   09/06/2019 1802 09/10/2019 1927 Full Code [id number]  Jordis Laneta FALCON, MD ED   01/04/2016 2247 01/10/2016 1726 Full Code [id number]  Ray Jacques BRAVO, PA-C Inpatient   10/07/2015 1249 10/09/2015 1959 Full Code [id number]  Secundino Cones, NP Inpatient   10/06/2015 2310 10/07/2015 1249 Full Code [id number]  Ray Jacques BRAVO, PA-C Inpatient   10/06/2015 1608 10/06/2015 2310 Full Code [id number]  Adeline Johnson, PA-C ED   04/28/2014 1634 04/29/2014 1639 Full Code [id number]  Tresea Houston, PA-C Inpatient    Questions for Most Recent Historical Code Status (Order [id number])     Question Answer   By: Consent: discussion documented in EHR            Home/SNF/Other Home  Chief Complaint Alcohol withdrawal  (HCC) [F10.939]  Level of Care/Admitting Diagnosis ED Disposition     ED Disposition  Admit   Condition  --   Comment  Hospital Area: [facility] [[id number]]  Level of Care: Progressive [102]  Admit to Progressive based on following criteria: MULTISYSTEM THREATS such as stable sepsis, metabolic/electrolyte imbalance with or without encephalopathy that is responding to early treatment.  May admit patient to [facility] or [facility] if equivalent level of care is available:: Yes  Covid Evaluation: Asymptomatic - no recent exposure (last 10 days) testing not required  Diagnosis: Alcohol withdrawal (HCC) [291.81.ICD-9-CM]  Admitting Physician: SHONA TERRY SAILOR [[id number]]  Attending Physician: SHONA TERRY SAILOR [[id number]]  Certification:: I certify this patient will need inpatient services for at least 2 midnights  Expected Medical Readiness: 08/24/2024          Medical History Past Medical History:  Diagnosis Date   ADHD (attention deficit hyperactivity disorder)    Alcohol dependence with acute alcoholic intoxication with complication (HCC) 01/05/2016   Anemia 1990s   Anxiety    Arthritis    neck   Asthma    uses inhalers every day   Cancer (HCC)    bladder   Cutaneous lupus erythematosus    Diverticulitis    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   History of anemia    Severe episode of recurrent major depressive disorder, 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/07/2020   Sleep apnea    mild   SUI (stress urinary incontinence, female)     Allergies Allergies  Allergen Reactions   Hydrocodone  Other (See Comments)    Dizzy; Tolerates oxycodone  fine    IV Location/Drains/Wounds Patient Lines/Drains/Airways Status     Active Line/Drains/Airways     Name Placement date Placement time Site Days   Peripheral IV 08/22/24 20 G Left Antecubital 08/22/24  --  Antecubital  less than 1   Incision - 5 Ports Abdomen Right;Lateral Right;Medial Mid Left  Left;Lateral 06/16/20  1345  -- 1528            Labs/Imaging Results for orders placed or performed during the hospital encounter of 08/22/24 (from the past 48 hours)  Ethanol     Status: Abnormal   Collection Time: 08/22/24  8:06 PM  Result Value Ref Range   Alcohol, Ethyl (B) 229 (H) <15 mg/dL    Comment: (NOTE) For medical purposes only. Performed at [facility], [address] [place], [place] [address]   CBC with Differential     Status: Abnormal   Collection Time: 08/22/24  8:06 PM  Result Value Ref Range   WBC 9.0 4.0 - 10.5 K/uL   RBC 5.01 3.87 - 5.11 MIL/uL   Hemoglobin 15.3 (H) 12.0 - 15.0 g/dL   HCT 52.1 (H) 63.9 - 53.9 %   MCV 95.4 80.0 - 100.0 fL   MCH 30.5 26.0 - 34.0 pg   MCHC 32.0 30.0 - 36.0 g/dL   RDW 87.1 88.4 - 84.4 %   Platelets 226 150 - 400 K/uL   nRBC 0.0 0.0 - 0.2 %   Neutrophils Relative % 70 %   Neutro Abs 6.3 1.7 - 7.7 K/uL   Lymphocytes Relative 25 %   Lymphs Abs 2.3 0.7 - 4.0 K/uL   Monocytes Relative 3 %   Monocytes Absolute 0.3 0.1 - 1.0 K/uL   Eosinophils Relative 1 %   Eosinophils Absolute 0.1 0.0 - 0.5 K/uL   Basophils Relative 1 %   Basophils Absolute 0.1 0.0 - 0.1 K/uL   Immature Granulocytes 0 %   Abs Immature Granulocytes 0.03 0.00 - 0.07 K/uL    Comment: Performed at [facility], [address] [place], [place] [address]  Comprehensive metabolic panel     Status: Abnormal   Collection Time: 08/22/24  8:06 PM  Result Value Ref Range   Sodium 141 135 - 145 mmol/L   Potassium 3.5 3.5 - 5.1 mmol/L   Chloride 100 98 - 111 mmol/L   CO2 22 22 - 32 mmol/L   Glucose, Bld 135 (H) 70 - 99 mg/dL    Comment: Glucose reference range applies only to samples taken after fasting for at least 8 hours.   BUN 13 6 - 20 mg/dL   Creatinine, Ser 9.43 0.44 - 1.00 mg/dL   Calcium  8.6 (L) 8.9 - 10.3 mg/dL   Total Protein 6.3 (L) 6.5 - 8.1 g/dL   Albumin 4.3 3.5 - 5.0 g/dL   AST 28 15 - 41 U/L   ALT 14  0 - 44 U/L   Alkaline Phosphatase 120 38 - 126 U/L   Total Bilirubin 0.7 0.0 - 1.2 mg/dL   GFR, Estimated >39 >39 mL/min    Comment: (NOTE) Calculated using the CKD-EPI Creatinine Equation (2021)    Anion gap 19 (H) 5 - 15    Comment: Performed at [facility], [address] [place], [place] [address]   CT Head Wo Contrast Result Date: 08/22/2024 EXAM: CT HEAD, FACIAL BONES AND CERVICAL SPINE WITHOUT CONTRAST 08/22/2024 08:39:30 PM TECHNIQUE: CT of the head, facial bones and cervical spine was performed without the administration of intravenous contrast. Multiplanar reformatted images are provided for review. Automated exposure control, iterative reconstruction, and/or weight based adjustment of the mA/kV was utilized to reduce the radiation dose to as low as reasonably achievable. COMPARISON: None available. CLINICAL HISTORY: Head trauma, moderate-severe. Patient brought in by medic. Per medic [facility] is [facility] with bruising on the right side of her head right buttock from a fall a few days ago from drinking per patient and wheezing. Patient c/o back and neck pain reported. Albuterol  duo ; neb mag and zofran  administered by [employer]. Hx COPD FINDINGS: CT HEAD BRAIN AND VENTRICLES: No acute intracranial hemorrhage. No mass effect or midline shift. No extra-axial fluid collection. No evidence of acute infarct. No hydrocephalus. SKULL AND SCALP: No acute skull fracture. No scalp hematoma. CT FACIAL BONES FACIAL BONES: No acute facial fracture. No mandibular dislocation. No suspicious bone lesion. ORBITS: No acute traumatic injury. SINUSES AND MASTOIDS: No acute abnormality. SOFT TISSUES: No acute abnormality. CT CERVICAL SPINE BONES AND ALIGNMENT: No acute fracture or traumatic malalignment. DEGENERATIVE CHANGES: Mild degenerative changes of the lower cervical spine, noting motion degradation. SOFT TISSUES: No prevertebral soft tissue swelling. IMPRESSION: 1. Motion degraded  images. 2. No acute intracranial abnormality. 3. No traumatic injury of the cervical spine. 4. No acute fracture of the facial bones. Electronically signed by: Pinkie Pebbles MD 08/22/2024 08:47 PM EDT RP Workstation: [id number]   CT Cervical Spine Wo Contrast Result Date: 08/22/2024 EXAM: CT HEAD, FACIAL BONES AND CERVICAL SPINE WITHOUT CONTRAST 08/22/2024 08:39:30 PM TECHNIQUE: CT of the head, facial bones and cervical spine was performed without the administration of intravenous contrast. Multiplanar reformatted images are provided for review. Automated exposure control, iterative reconstruction, and/or weight based adjustment of the mA/kV was utilized to reduce the radiation dose to as low as reasonably achievable. COMPARISON: None available. CLINICAL HISTORY: Head trauma, moderate-severe. Patient brought in by medic. Per medic [facility] is [facility] with bruising on the right side of her head right buttock from a fall a few days ago from drinking per patient and wheezing. Patient c/o back and neck pain reported. Albuterol  duo ; neb mag and zofran  administered by [employer]. Hx COPD FINDINGS: CT HEAD BRAIN AND VENTRICLES: No acute intracranial hemorrhage. No mass effect or midline shift. No extra-axial fluid collection. No evidence of acute infarct. No hydrocephalus. SKULL AND SCALP: No acute skull fracture. No scalp hematoma. CT FACIAL BONES FACIAL BONES: No acute facial fracture. No mandibular dislocation. No suspicious bone lesion. ORBITS: No acute traumatic injury. SINUSES AND MASTOIDS: No acute abnormality. SOFT TISSUES: No acute abnormality. CT CERVICAL SPINE BONES AND ALIGNMENT: No acute fracture or traumatic malalignment. DEGENERATIVE CHANGES: Mild degenerative changes of the lower cervical spine, noting motion degradation. SOFT TISSUES: No prevertebral soft tissue swelling. IMPRESSION: 1. Motion degraded images. 2. No acute intracranial abnormality. 3. No traumatic injury of the cervical spine.  4. No acute fracture of the facial bones. Electronically signed by: Pinkie Pebbles MD 08/22/2024 08:47 PM EDT RP Workstation: [id number]   CT Maxillofacial Wo Contrast Result Date: 08/22/2024 EXAM: CT HEAD, FACIAL BONES AND CERVICAL SPINE WITHOUT CONTRAST 08/22/2024 08:39:30 PM TECHNIQUE: CT of the head, facial bones and cervical spine was performed without the administration of intravenous  contrast. Multiplanar reformatted images are provided for review. Automated exposure control, iterative reconstruction, and/or weight based adjustment of the mA/kV was utilized to reduce the radiation dose to as low as reasonably achievable. COMPARISON: None available. CLINICAL HISTORY: Head trauma, moderate-severe. Patient brought in by medic. Per medic [facility] is [facility] with bruising on the right side of her head right buttock from a fall a few days ago from drinking per patient and wheezing. Patient c/o back and neck pain reported. Albuterol  duo ; neb mag and zofran  administered by [employer]. Hx COPD FINDINGS: CT HEAD BRAIN AND VENTRICLES: No acute intracranial hemorrhage. No mass effect or midline shift. No extra-axial fluid collection. No evidence of acute infarct. No hydrocephalus. SKULL AND SCALP: No acute skull fracture. No scalp hematoma. CT FACIAL BONES FACIAL BONES: No acute facial fracture. No mandibular dislocation. No suspicious bone lesion. ORBITS: No acute traumatic injury. SINUSES AND MASTOIDS: No acute abnormality. SOFT TISSUES: No acute abnormality. CT CERVICAL SPINE BONES AND ALIGNMENT: No acute fracture or traumatic malalignment. DEGENERATIVE CHANGES: Mild degenerative changes of the lower cervical spine, noting motion degradation. SOFT TISSUES: No prevertebral soft tissue swelling. IMPRESSION: 1. Motion degraded images. 2. No acute intracranial abnormality. 3. No traumatic injury of the cervical spine. 4. No acute fracture of the facial bones. Electronically signed by: Pinkie Pebbles MD  08/22/2024 08:47 PM EDT RP Workstation: [id number]    Pending Labs Unresulted Labs (From admission, onward)     Start     Ordered   08/22/24 2230  Urine rapid drug screen (hosp performed)  Once,   URGENT        08/22/24 2229            Vitals/Pain Today's Vitals   08/22/24 1932 08/22/24 2050 08/22/24 2222 08/22/24 2230  BP:    122/65  Pulse:  (!) 113 (!) 134 (!) 134  Resp:  17  18  Temp:      TempSrc:      SpO2:  93% 94%   Weight:      Height:      PainSc: 8        Isolation Precautions No active isolations  Medications Medications  LORazepam  (ATIVAN ) injection 2 mg (2 mg Intravenous Given 08/22/24 1947)    Mobility walks with person assist

## 2024-08-22 NOTE — ED Provider Notes (Signed)
 [place] [facility] AT [facility] Provider Note   CSN: [id number] Arrival date & time: 08/22/24  1840     Patient presents with: No chief complaint on file.   [name] is a 58 y.o. female.   58 year old female with prior medical history as detailed below presents for evaluation.  Patient reports multiple falls over the last several days.  She did strike her head several times.  She reports that she is an alcoholic.  Her last alcohol intake was earlier today.  She was attempting to check into [facility] for alcohol detox.  She had another fall and was subsequently sent to the ED for evaluation.  The history is provided by the patient and medical records.       Prior to Admission medications   Medication Sig Start Date End Date Taking? Authorizing Provider  acamprosate  (CAMPRAL ) 333 MG tablet Take 2 tablets (666 mg total) by mouth 3 (three) times daily. 01/12/24   Lynnette Barter, MD  albuterol  (ACCUNEB ) 1.25 MG/3ML nebulizer solution Take 1 ampule by nebulization 3 (three) times daily as needed for wheezing or shortness of breath.    [provider]  albuterol  (VENTOLIN  HFA) 108 (90 Base) MCG/ACT inhaler Inhale 1-2 puffs into the lungs every 6 (six) hours as needed for wheezing. 10/14/23   [provider]  budesonide  (PULMICORT ) 0.5 MG/2ML nebulizer solution Inhale 0.5 mg into the lungs daily. 12/25/23 12/24/24  [provider]  buPROPion  (WELLBUTRIN  XL) 150 MG 24 hr tablet Take 1 tablet (150 mg total) by mouth daily. 05/14/24   Mozingo, Regina Nattalie, NP  busPIRone  (BUSPAR ) 15 MG tablet Take 0.5 tablets (7.5 mg total) by mouth 2 (two) times daily. 01/12/24   Lynnette Barter, MD  cloNIDine  (CATAPRES ) 0.1 MG tablet Take 1 tablet (0.1 mg total) by mouth 2 (two) times daily as needed (For anxiety). 01/12/24 02/11/24  Lynnette Barter, MD  DUPIXENT 300 MG/2ML SOAJ Inject 300 mg into the skin every 14 (fourteen) days.    [provider]   FLUoxetine  (PROZAC ) 40 MG capsule Take 1 capsule (40 mg total) by mouth daily. 05/14/24 08/12/24  Mozingo, Regina Nattalie, NP  folic acid  (FOLVITE ) 1 MG tablet Take 1 mg by mouth daily.    [provider]  gabapentin  (NEURONTIN ) 100 MG capsule Take 1 capsule (100 mg total) by mouth 3 (three) times daily. 01/12/24 02/11/24  Lynnette Barter, MD  hydrOXYzine  (ATARAX ) 25 MG tablet Take 1 tablet (25 mg total) by mouth every 6 (six) hours as needed (For anxiety or agitation). 01/12/24   Lynnette Barter, MD  lamoTRIgine  (LAMICTAL ) 100 MG tablet Take 50 mg by mouth 2 (two) times daily. 11/23/23   [provider]  nicotine  (NICODERM CQ  - DOSED IN MG/24 HOURS) 14 mg/24hr patch Place 1 patch (14 mg total) onto the skin daily as needed (For smoking cessation). 01/12/24   Lynnette Barter, MD  progesterone  (PROMETRIUM ) 100 MG capsule Take 100-200 mg by mouth at bedtime. 10/19/23   [provider]  SUMAtriptan  (IMITREX ) 100 MG tablet Take 1 tablet (100 mg total) by mouth every 2 (two) hours as needed for migraine. May repeat in 2 hours if headache persists or recurs. Do not exceed 2 tablets within a 24 hour period. 04/12/23   Briana Elgin LABOR, MD  thiamine  (VITAMIN B-1) 100 MG tablet Take 1 tablet (100 mg total) by mouth daily. 11/29/23   Dorinda Drue DASEN, MD  TRELEGY ELLIPTA 100-62.5-25 MCG/ACT AEPB Inhale 1 puff  into the lungs daily. 12/28/23   [provider]  triamcinolone  cream (KENALOG ) 0.5 % Apply topically 2 (two) times daily. Patient not taking: Reported on 01/11/2024 08/25/23   Victoria Ruts, MD    Allergies: Hydrocodone     Review of Systems  All other systems reviewed and are negative.   Updated Vital Signs BP 113/70 (BP Location: Right Arm)   Pulse 92   Temp 98.9 F (37.2 C) (Oral)   Resp 18   Ht 5' 5 (1.651 m)   Wt 59.9 kg   SpO2 95%   BMI 21.97 kg/m   Physical Exam Vitals and nursing note reviewed.  Constitutional:      General: She is not in acute distress.     Appearance: Normal appearance. She is well-developed.  HENT:     Head: Normocephalic.     Comments: Contusions noted to the right forehead Eyes:     Conjunctiva/sclera: Conjunctivae normal.     Pupils: Pupils are equal, round, and reactive to light.  Cardiovascular:     Rate and Rhythm: Normal rate and regular rhythm.     Heart sounds: Normal heart sounds.  Pulmonary:     Effort: Pulmonary effort is normal. No respiratory distress.     Breath sounds: Normal breath sounds.  Abdominal:     General: There is no distension.     Palpations: Abdomen is soft.     Tenderness: There is no abdominal tenderness.  Musculoskeletal:        General: No deformity. Normal range of motion.     Cervical back: Normal range of motion and neck supple.  Skin:    General: Skin is warm and dry.  Neurological:     General: No focal deficit present.     Mental Status: She is alert and oriented to person, place, and time.     (all labs ordered are listed, but only abnormal results are displayed) Labs Reviewed  ETHANOL - Abnormal; Notable for the following components:      Result Value   Alcohol, Ethyl (B) 229 (*)    All other components within normal limits  CBC WITH DIFFERENTIAL/PLATELET - Abnormal; Notable for the following components:   Hemoglobin 15.3 (*)    HCT 47.8 (*)    All other components within normal limits  COMPREHENSIVE METABOLIC PANEL WITH GFR - Abnormal; Notable for the following components:   Glucose, Bld 135 (*)    Calcium  8.6 (*)    Total Protein 6.3 (*)    Anion gap 19 (*)    All other components within normal limits  URINE DRUG SCREEN    EKG: EKG Interpretation Date/Time:  Friday August 22 2024 21:56:48 EDT Ventricular Rate:  117 PR Interval:  156 QRS Duration:  90 QT Interval:  319 QTC Calculation: 445 R Axis:   83  Text Interpretation: Sinus tachycardia Right atrial enlargement Confirmed by Laurice Coy [phone]) on 08/22/2024 10:03:41 PM  Radiology: No  results found.   Procedures   Medications Ordered in the ED  LORazepam  (ATIVAN ) injection 2 mg (has no administration in time range)                                    Medical Decision Making Patient presents from [name].  She was seeking treatment for alcohol addiction.  Her last drink was earlier today.  She apparently fell at [facility] and was referred to  the ED for evaluation.  Patient is cooperative and pleasant on evaluation.  She is tremulous.  She is unable to ambulate without assistance.  Heart rate increased to 135 with attempted ambulation.  Ambulation trial was after 2 mg of Ativan  was given IV.  CT imaging evaluating for traumatic injury is fortunately negative.  I suspect patient is experiencing significant withdrawal.  She would benefit from admission.  Hospitalist service made aware of case.    Amount and/or Complexity of Data Reviewed Labs: ordered. Radiology: ordered.  Risk Prescription drug management. Decision regarding hospitalization.   CRITICAL CARE Performed by: Maude JAYSON Galloway   Total critical care time: 30 minutes  Critical care time was exclusive of separately billable procedures and treating other patients.  Critical care was necessary to treat or prevent imminent or life-threatening deterioration.  Critical care was time spent personally by me on the following activities: development of treatment plan with patient and/or surrogate as well as nursing, discussions with consultants, evaluation of patient's response to treatment, examination of patient, obtaining history from patient or surrogate, ordering and performing treatments and interventions, ordering and review of laboratory studies, ordering and review of radiographic studies, pulse oximetry and re-evaluation of patient's condition.      Final diagnoses:  Fall, initial encounter  ETOH abuse    ED Discharge Orders     None          Galloway Maude JAYSON,  MD 08/22/24 2231

## 2024-08-22 NOTE — ED Triage Notes (Addendum)
 Patient brought in by medic. Per medic patient is [facility] with bruising on the right side of her head right buttock from a fall a few days ago from drinking per patient and wheezing. Patient c/o back and neck pain reported. Albuterol  duo neb mag and zofran  administered by [employer]. Hx COPD

## 2024-08-22 NOTE — H&P (Signed)
 History and Physical  [name] FMW:[id number] DOB: 11-Jul-1966 DOA: 08/22/2024  Referring physician: Dr. Laurice, EDP  PCP: Valora Agent, MD  Outpatient Specialists: None Patient coming from: Home  Chief Complaint: Fall and alcohol intoxication.    HPI: [name] is a 58 y.o. female with medical history significant for alcohol abuse, asthma, chronic anxiety/depression, who presents to the ER after a fall as she was attempting to check into [facility] for alcohol detoxification.  Has had multiple falls for the past several days.  EMS was activated.  The patient was noted to be intoxicated with alcohol.  Her last drink was earlier today.  No reported chest pain or palpitations.  No reported abdominal pain, fevers, or chills.  In the ER, unable to ambulate without assistance.  Tremulous, tachycardic, tachypneic, alcohol level 229.  Due to concern for early alcohol withdrawal the patient was started on CIWA protocol.  ED Course: Temperature 98.  BP 121/69, pulse 115, respiration rate 22, O2 saturation 94% on room air.  Review of Systems: Review of systems as noted in the HPI. All other systems reviewed and are negative.   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   History of anemia    Severe episode of recurrent major depressive disorder, without psychotic features (HCC) 01/07/2020   Sleep apnea    mild   SUI (stress urinary incontinence, female)    Past Surgical History:  Procedure Laterality Date   BLADDER SUSPENSION N/A 02/13/2013   Procedure: [facility] SLING;  Surgeon: Norleen JINNY Seltzer, MD;  Location: [facility];  Service: Urology;  Laterality: N/A;   CESAREAN SECTION  12-23-2004    W/ LEFT TUBAL LIGATION AND RIGHT SALPINGECTOMY   CESAREAN SECTION  1995   CYSTOSCOPY WITH STENT PLACEMENT Bilateral 06/16/2020   Procedure: CYSTOSCOPY WITH FIREFLY INJECTION;  Surgeon: Carolee Sherwood JONETTA DOUGLAS, MD;  Location: [facility];  Service: Urology;  Laterality: Bilateral;   IR RADIOLOGIST EVAL & MGMT  04/21/2020   IR RADIOLOGIST EVAL & MGMT  05/12/2020   ORIF HUMERUS FRACTURE Right 04/28/2014   Procedure: OPEN REDUCTION INTERNAL FIXATION (ORIF) PROXIMAL HUMERUS FRACTURE;  Surgeon: Eva Elsie Herring, MD;  Location: [facility];  Service: Orthopedics;  Laterality: Right;   ORIF PROXIMAL HUMERUS FRACTURE Right 04/28/2014   DR CHANDLER   PROCTOSCOPY N/A 06/16/2020   Procedure: RIGID PROCTOSCOPY;  Surgeon: Sheldon Standing, MD;  Location: [facility];  Service: General;  Laterality: N/A;   TUBAL LIGATION      Social History:  reports that she has been smoking cigarettes. She started smoking about 24 years ago. She has a 8.7 pack-year smoking history. She has never used smokeless tobacco. She reports current alcohol use of about 2.0 standard drinks of alcohol per week. She reports that she does not use drugs.   Allergies  Allergen Reactions   Hydrocodone  Other (See Comments)    Dizzy; Tolerates oxycodone  fine    Family History  Adopted: Yes      Prior to Admission medications   Medication Sig Start Date End Date Taking? Authorizing  Provider  acamprosate  (CAMPRAL ) 333 MG tablet Take 2 tablets (666 mg total) by mouth 3 (three) times daily. Patient taking differently: Take 999 mg by mouth in the morning and at bedtime. 01/12/24  Yes Lynnette Barter, MD  albuterol  (ACCUNEB ) 1.25 MG/3ML nebulizer solution Take 1 ampule by nebulization 3 (three) times daily as needed for wheezing or shortness of breath.   Yes [provider]  albuterol  (VENTOLIN  HFA) 108 (90 Base) MCG/ACT inhaler Inhale 1-2 puffs into the lungs every 6  (six) hours as needed for wheezing. 10/14/23  Yes [provider]  budesonide  (PULMICORT ) 0.5 MG/2ML nebulizer solution Inhale 0.5 mg into the lungs daily. 12/25/23 12/24/24 Yes [provider]  buPROPion  (WELLBUTRIN  XL) 150 MG 24 hr tablet Take 1 tablet (150 mg total) by mouth daily. 05/14/24  Yes Mozingo, Regina Nattalie, NP  busPIRone  (BUSPAR ) 10 MG tablet Take 10 mg by mouth 2 (two) times daily. 04/07/24  Yes [provider]  cloNIDine  (CATAPRES ) 0.1 MG tablet Take 1 tablet (0.1 mg total) by mouth 2 (two) times daily as needed (For anxiety). 01/12/24 08/22/24 Yes Lynnette Barter, MD  DUPIXENT 300 MG/2ML SOAJ Inject 300 mg into the skin every 14 (fourteen) days.   Yes [provider]  FLUoxetine  (PROZAC ) 20 MG capsule Take 20 mg by mouth every morning. 07/25/24  Yes [provider]  fluticasone  (FLONASE ) 50 MCG/ACT nasal spray Place 2 sprays into both nostrils daily. 08/11/24  Yes [provider]  folic acid  (FOLVITE ) 1 MG tablet Take 1 mg by mouth daily.   Yes [provider]  hydrOXYzine  (ATARAX ) 25 MG tablet Take 1 tablet (25 mg total) by mouth every 6 (six) hours as needed (For anxiety or agitation). 01/12/24  Yes Lynnette Barter, MD  lamoTRIgine  (LAMICTAL ) 100 MG tablet Take 50 mg by mouth 2 (two) times daily. 11/23/23  Yes [provider]  losartan (COZAAR) 50 MG tablet Take 1 tablet by mouth daily. 04/22/24  Yes [provider]  methocarbamol  (ROBAXIN ) 500 MG tablet Take 1,000 mg by mouth every 8 (eight) hours as needed for muscle spasms. 04/22/24  Yes [provider]  progesterone  (PROMETRIUM ) 100 MG capsule Take 100-200 mg by mouth at bedtime. 10/19/23  Yes [provider]  SUMAtriptan  (IMITREX ) 100 MG tablet Take 1 tablet (100 mg total) by mouth every 2 (two) hours as needed for migraine. May repeat in 2 hours if headache persists or recurs. Do not exceed 2 tablets within a 24 hour period. 04/12/23  Yes Briana Elgin LABOR, MD  thiamine  (VITAMIN B-1) 100 MG tablet Take 1 tablet (100 mg total) by mouth daily. 11/29/23  Yes Djan, Drue DASEN, MD  TRELEGY ELLIPTA 100-62.5-25 MCG/ACT AEPB Inhale 1 puff into the lungs daily. 12/28/23  Yes [provider]  triamcinolone  cream (KENALOG ) 0.5 % Apply topically 2 (two) times daily. 08/25/23  Yes Victoria Ruts, MD  busPIRone  (BUSPAR ) 15 MG tablet Take 0.5 tablets (7.5 mg total) by mouth 2 (two) times daily. Patient not taking: Reported on 08/22/2024 01/12/24   Lynnette Barter, MD  gabapentin  (NEURONTIN ) 100 MG capsule Take 1 capsule (100 mg total) by mouth 3 (three) times daily. Patient not taking: Reported on 08/22/2024 01/12/24 02/11/24  Lynnette Barter, MD  nicotine  (NICODERM CQ  - DOSED IN MG/24 HOURS) 14 mg/24hr patch Place 1 patch (14 mg total) onto the skin daily as needed (For smoking cessation). 01/12/24   Lynnette Barter, MD    Physical Exam: BP 122/65   Pulse ROLLEN)  117   Temp 98.9 F (37.2 C) (Oral)   Resp (!) 22   Ht 5' 5 (1.651 m)   Wt 59.9 kg   SpO2 92%   BMI 21.97 kg/m   General: 58 y.o. year-old female well developed well nourished in no acute distress.  Alert and oriented x3. Cardiovascular: Regular rate and rhythm with no rubs or gallops.  No thyromegaly or JVD noted.  No lower extremity edema. 2/4 pulses in all 4 extremities. Respiratory: Clear to auscultation with no wheezes or rales. Good inspiratory effort. Abdomen: Soft nontender nondistended with normal bowel sounds x4 quadrants. Muskuloskeletal: No cyanosis, clubbing or edema noted bilaterally Neuro: CN II-XII intact, strength, sensation, reflexes Skin: No ulcerative lesions noted or rashes Psychiatry: Judgement and insight appear normal. Mood is appropriate for condition and setting          Labs on Admission:  Basic Metabolic Panel: Recent Labs  Lab 08/22/24 2006  NA 141  K 3.5  CL 100  CO2 22  GLUCOSE 135*  BUN 13  CREATININE 0.56  CALCIUM  8.6*   Liver Function Tests: Recent Labs  Lab  08/22/24 2006  AST 28  ALT 14  ALKPHOS 120  BILITOT 0.7  PROT 6.3*  ALBUMIN 4.3   No results for input(s): LIPASE, AMYLASE in the last 168 hours. No results for input(s): AMMONIA in the last 168 hours. CBC: Recent Labs  Lab 08/22/24 2006  WBC 9.0  NEUTROABS 6.3  HGB 15.3*  HCT 47.8*  MCV 95.4  PLT 226   Cardiac Enzymes: No results for input(s): CKTOTAL, CKMB, CKMBINDEX, TROPONINI in the last 168 hours.  BNP (last 3 results) Recent Labs    11/26/23 0340  BNP 14.9    ProBNP (last 3 results) No results for input(s): PROBNP in the last 8760 hours.  CBG: No results for input(s): GLUCAP in the last 168 hours.  Radiological Exams on Admission: CT Head Wo Contrast Result Date: 08/22/2024 EXAM: CT HEAD, FACIAL BONES AND CERVICAL SPINE WITHOUT CONTRAST 08/22/2024 08:39:30 PM TECHNIQUE: CT of the head, facial bones and cervical spine was performed without the administration of intravenous contrast. Multiplanar reformatted images are provided for review. Automated exposure control, iterative reconstruction, and/or weight based adjustment of the mA/kV was utilized to reduce the radiation dose to as low as reasonably achievable. COMPARISON: None available. CLINICAL HISTORY: Head trauma, moderate-severe. Patient brought in by medic. Per medic [facility] with bruising on the right side of her head right buttock from a fall a few days ago from drinking per patient and wheezing. Patient c/o back and neck pain reported. Albuterol  duo ; neb mag and zofran  administered by [employer]. Hx COPD FINDINGS: CT HEAD BRAIN AND VENTRICLES: No acute intracranial hemorrhage. No mass effect or midline shift. No extra-axial fluid collection. No evidence of acute infarct. No hydrocephalus. SKULL AND SCALP: No acute skull fracture. No scalp hematoma. CT FACIAL BONES FACIAL BONES: No acute facial fracture. No mandibular dislocation. No suspicious bone lesion. ORBITS: No acute  traumatic injury. SINUSES AND MASTOIDS: No acute abnormality. SOFT TISSUES: No acute abnormality. CT CERVICAL SPINE BONES AND ALIGNMENT: No acute fracture or traumatic malalignment. DEGENERATIVE CHANGES: Mild degenerative changes of the lower cervical spine, noting motion degradation. SOFT TISSUES: No prevertebral soft tissue swelling. IMPRESSION: 1. Motion degraded images. 2. No acute intracranial abnormality. 3. No traumatic injury of the cervical spine. 4. No acute fracture of the facial bones. Electronically signed by: Pinkie Pebbles MD 08/22/2024 08:47 PM EDT  RP Workstation: [id number]   CT Cervical Spine Wo Contrast Result Date: 08/22/2024 EXAM: CT HEAD, FACIAL BONES AND CERVICAL SPINE WITHOUT CONTRAST 08/22/2024 08:39:30 PM TECHNIQUE: CT of the head, facial bones and cervical spine was performed without the administration of intravenous contrast. Multiplanar reformatted images are provided for review. Automated exposure control, iterative reconstruction, and/or weight based adjustment of the mA/kV was utilized to reduce the radiation dose to as low as reasonably achievable. COMPARISON: None available. CLINICAL HISTORY: Head trauma, moderate-severe. Patient brought in by medic. Per medic [facility] with bruising on the right side of her head right buttock from a fall a few days ago from drinking per patient and wheezing. Patient c/o back and neck pain reported. Albuterol  duo ; neb mag and zofran  administered by [employer]. Hx COPD FINDINGS: CT HEAD BRAIN AND VENTRICLES: No acute intracranial hemorrhage. No mass effect or midline shift. No extra-axial fluid collection. No evidence of acute infarct. No hydrocephalus. SKULL AND SCALP: No acute skull fracture. No scalp hematoma. CT FACIAL BONES FACIAL BONES: No acute facial fracture. No mandibular dislocation. No suspicious bone lesion. ORBITS: No acute traumatic injury. SINUSES AND MASTOIDS: No acute abnormality. SOFT TISSUES: No acute  abnormality. CT CERVICAL SPINE BONES AND ALIGNMENT: No acute fracture or traumatic malalignment. DEGENERATIVE CHANGES: Mild degenerative changes of the lower cervical spine, noting motion degradation. SOFT TISSUES: No prevertebral soft tissue swelling. IMPRESSION: 1. Motion degraded images. 2. No acute intracranial abnormality. 3. No traumatic injury of the cervical spine. 4. No acute fracture of the facial bones. Electronically signed by: Pinkie Pebbles MD 08/22/2024 08:47 PM EDT RP Workstation: [id number]   CT Maxillofacial Wo Contrast Result Date: 08/22/2024 EXAM: CT HEAD, FACIAL BONES AND CERVICAL SPINE WITHOUT CONTRAST 08/22/2024 08:39:30 PM TECHNIQUE: CT of the head, facial bones and cervical spine was performed without the administration of intravenous contrast. Multiplanar reformatted images are provided for review. Automated exposure control, iterative reconstruction, and/or weight based adjustment of the mA/kV was utilized to reduce the radiation dose to as low as reasonably achievable. COMPARISON: None available. CLINICAL HISTORY: Head trauma, moderate-severe. Patient brought in by medic. Per medic [facility] with bruising on the right side of her head right buttock from a fall a few days ago from drinking per patient and wheezing. Patient c/o back and neck pain reported. Albuterol  duo ; neb mag and zofran  administered by [employer]. Hx COPD FINDINGS: CT HEAD BRAIN AND VENTRICLES: No acute intracranial hemorrhage. No mass effect or midline shift. No extra-axial fluid collection. No evidence of acute infarct. No hydrocephalus. SKULL AND SCALP: No acute skull fracture. No scalp hematoma. CT FACIAL BONES FACIAL BONES: No acute facial fracture. No mandibular dislocation. No suspicious bone lesion. ORBITS: No acute traumatic injury. SINUSES AND MASTOIDS: No acute abnormality. SOFT TISSUES: No acute abnormality. CT CERVICAL SPINE BONES AND ALIGNMENT: No acute fracture or traumatic  malalignment. DEGENERATIVE CHANGES: Mild degenerative changes of the lower cervical spine, noting motion degradation. SOFT TISSUES: No prevertebral soft tissue swelling. IMPRESSION: 1. Motion degraded images. 2. No acute intracranial abnormality. 3. No traumatic injury of the cervical spine. 4. No acute fracture of the facial bones. Electronically signed by: Pinkie Pebbles MD 08/22/2024 08:47 PM EDT RP Workstation: [id number]    EKG: I independently viewed the EKG done and my findings are as followed: Sinus tachycardia rate of 117.  Nonspecific ST-T changes.  QTc 445.  Assessment/Plan Present on Admission:  Alcohol withdrawal (HCC)  Principal Problem:   Alcohol withdrawal (  HCC)  Alcohol withdrawal, alcohol abuse Continue CIWA protocol and IV fluid hydration Plans to return to [facility] to complete alcohol detoxification Continue fall precautions Recommend complete abstinence from alcohol use.  Asthma Resume home bronchodilators Maintain O2 saturation above 92%  Tobacco use disorder Recommend tobacco cessation Nicotine  patch   Time: 75 minutes.   DVT prophylaxis: Subcu Lovenox  daily.  Code Status: Full code.  Family Communication: None at bedside.  Disposition Plan: Admitted to progressive care unit.  Consults called: None.  Admission status: Inpatient status.   Status is: Inpatient The patient requires at least 2 midnights for further evaluation and treatment of present condition.   Terry LOISE [facility] MD [facility] Pager [phone]  If 7PM-7AM, please contact night-coverage [email or web address] Password [email or web address]  08/22/2024, 11:30 PM

## 2024-08-23 ENCOUNTER — Encounter (HOSPITAL_COMMUNITY): Payer: Self-pay | Admitting: Internal Medicine

## 2024-08-23 DIAGNOSIS — F10939 Alcohol use, unspecified with withdrawal, unspecified: Secondary | ICD-10-CM | POA: Diagnosis not present

## 2024-08-23 LAB — CBC
HCT: 45.7 % (ref 36.0–46.0)
Hemoglobin: 14.5 g/dL (ref 12.0–15.0)
MCH: 30.7 pg (ref 26.0–34.0)
MCHC: 31.7 g/dL (ref 30.0–36.0)
MCV: 96.8 fL (ref 80.0–100.0)
Platelets: 193 K/uL (ref 150–400)
RBC: 4.72 MIL/uL (ref 3.87–5.11)
RDW: 12.7 % (ref 11.5–15.5)
WBC: 6.7 K/uL (ref 4.0–10.5)
nRBC: 0 % (ref 0.0–0.2)

## 2024-08-23 LAB — COMPREHENSIVE METABOLIC PANEL WITH GFR
ALT: 12 U/L (ref 0–44)
AST: 24 U/L (ref 15–41)
Albumin: 4 g/dL (ref 3.5–5.0)
Alkaline Phosphatase: 111 U/L (ref 38–126)
Anion gap: 18 — ABNORMAL HIGH (ref 5–15)
BUN: 14 mg/dL (ref 6–20)
CO2: 20 mmol/L — ABNORMAL LOW (ref 22–32)
Calcium: 8.8 mg/dL — ABNORMAL LOW (ref 8.9–10.3)
Chloride: 101 mmol/L (ref 98–111)
Creatinine, Ser: 0.62 mg/dL (ref 0.44–1.00)
GFR, Estimated: >60 mL/min (ref >60)
Glucose, Bld: 177 mg/dL — ABNORMAL HIGH (ref 70–99)
Potassium: 3.7 mmol/L (ref 3.5–5.1)
Sodium: 139 mmol/L (ref 135–145)
Total Bilirubin: 0.7 mg/dL (ref 0.0–1.2)
Total Protein: 6 g/dL — ABNORMAL LOW (ref 6.5–8.1)

## 2024-08-23 LAB — PHOSPHORUS: Phosphorus: 3.4 mg/dL (ref 2.5–4.6)

## 2024-08-23 LAB — MAGNESIUM: Magnesium: 2.2 mg/dL (ref 1.7–2.4)

## 2024-08-23 LAB — HIV ANTIBODY (ROUTINE TESTING W REFLEX): HIV Screen 4th Generation wRfx: NONREACTIVE

## 2024-08-23 MED ORDER — ALBUTEROL SULFATE (2.5 MG/3ML) 0.083% IN NEBU
3.0000 mL | INHALATION_SOLUTION | Freq: Four times a day (QID) | RESPIRATORY_TRACT | Status: DC | PRN
  Administered 2024-08-23 – 2024-08-24 (×2): 3 mL via RESPIRATORY_TRACT
  Filled 2024-08-23 (×2): qty 3

## 2024-08-23 MED ORDER — BUDESONIDE 0.5 MG/2ML IN SUSP
0.5000 mg | Freq: Every day | RESPIRATORY_TRACT | Status: DC
  Administered 2024-08-23 – 2024-08-27 (×5): 0.5 mg via RESPIRATORY_TRACT
  Filled 2024-08-23 (×5): qty 2

## 2024-08-23 MED ORDER — CLONIDINE HCL 0.1 MG PO TABS
0.1000 mg | ORAL_TABLET | Freq: Two times a day (BID) | ORAL | Status: DC | PRN
Start: 2024-08-23 — End: 2024-08-26
  Administered 2024-08-24: 0.1 mg via ORAL
  Filled 2024-08-23: qty 1

## 2024-08-23 MED ORDER — BUDESON-GLYCOPYRROL-FORMOTEROL 160-9-4.8 MCG/ACT IN AERO
2.0000 | INHALATION_SPRAY | Freq: Two times a day (BID) | RESPIRATORY_TRACT | Status: DC
Start: 2024-08-23 — End: 2024-08-27
  Administered 2024-08-23 – 2024-08-27 (×8): 2 via RESPIRATORY_TRACT
  Filled 2024-08-23: qty 5.9

## 2024-08-23 MED ORDER — FLUTICASONE PROPIONATE 50 MCG/ACT NA SUSP
2.0000 | Freq: Every day | NASAL | Status: DC
  Administered 2024-08-24 – 2024-08-26 (×2): 2 via NASAL
  Filled 2024-08-23: qty 16

## 2024-08-23 MED ORDER — LORAZEPAM 2 MG/ML IJ SOLN
1.0000 mg | INTRAMUSCULAR | Status: AC | PRN
  Administered 2024-08-23 – 2024-08-24 (×4): 2 mg via INTRAVENOUS
  Administered 2024-08-24: 1 mg via INTRAVENOUS
  Administered 2024-08-25: 2 mg via INTRAVENOUS
  Administered 2024-08-25: 1 mg via INTRAVENOUS
  Administered 2024-08-25 (×3): 2 mg via INTRAVENOUS
  Filled 2024-08-23 (×10): qty 1

## 2024-08-23 MED ORDER — CHLORDIAZEPOXIDE HCL 25 MG PO CAPS
25.0000 mg | ORAL_CAPSULE | Freq: Three times a day (TID) | ORAL | Status: AC
  Administered 2024-08-23 – 2024-08-24 (×3): 25 mg via ORAL
  Filled 2024-08-23 (×3): qty 1

## 2024-08-23 MED ORDER — SUMATRIPTAN SUCCINATE 50 MG PO TABS
100.0000 mg | ORAL_TABLET | ORAL | Status: DC | PRN
Start: 2024-08-23 — End: 2024-08-27

## 2024-08-23 MED ORDER — IPRATROPIUM-ALBUTEROL 0.5-2.5 (3) MG/3ML IN SOLN
3.0000 mL | RESPIRATORY_TRACT | Status: DC
  Administered 2024-08-23: 3 mL via RESPIRATORY_TRACT
  Filled 2024-08-23: qty 3

## 2024-08-23 MED ORDER — LAMOTRIGINE 25 MG PO TABS
50.0000 mg | ORAL_TABLET | Freq: Two times a day (BID) | ORAL | Status: DC
  Administered 2024-08-23 – 2024-08-27 (×9): 50 mg via ORAL
  Filled 2024-08-23 (×9): qty 2

## 2024-08-23 MED ORDER — BUSPIRONE HCL 10 MG PO TABS
10.0000 mg | ORAL_TABLET | Freq: Two times a day (BID) | ORAL | Status: DC
Start: 2024-08-23 — End: 2024-08-27
  Administered 2024-08-23 – 2024-08-27 (×9): 10 mg via ORAL
  Filled 2024-08-23 (×9): qty 1

## 2024-08-23 MED ORDER — CHLORDIAZEPOXIDE HCL 25 MG PO CAPS: 25.0000 mg | ORAL_CAPSULE | Freq: Two times a day (BID) | ORAL | Status: DC

## 2024-08-23 MED ORDER — CHLORDIAZEPOXIDE HCL 25 MG PO CAPS: 25.0000 mg | ORAL_CAPSULE | Freq: Every day | ORAL | Status: DC

## 2024-08-23 MED ORDER — FLUOXETINE HCL 20 MG PO CAPS
20.0000 mg | ORAL_CAPSULE | Freq: Every morning | ORAL | Status: DC
  Administered 2024-08-24 – 2024-08-27 (×4): 20 mg via ORAL
  Filled 2024-08-23 (×4): qty 1

## 2024-08-23 MED ORDER — BUPROPION HCL ER (XL) 150 MG PO TB24
150.0000 mg | ORAL_TABLET | Freq: Every day | ORAL | Status: DC
  Administered 2024-08-23 – 2024-08-27 (×5): 150 mg via ORAL
  Filled 2024-08-23 (×5): qty 1

## 2024-08-23 MED ORDER — PROGESTERONE MICRONIZED 100 MG PO CAPS
200.0000 mg | ORAL_CAPSULE | Freq: Every day | ORAL | Status: DC
  Administered 2024-08-24 – 2024-08-26 (×4): 200 mg via ORAL
  Filled 2024-08-23 (×4): qty 2

## 2024-08-23 NOTE — Plan of Care (Signed)
   Problem: Education: Goal: Knowledge of General Education information will improve Description Including pain rating scale, medication(s)/side effects and non-pharmacologic comfort measures Outcome: Progressing   Problem: Health Behavior/Discharge Planning: Goal: Ability to manage health-related needs will improve Outcome: Progressing

## 2024-08-23 NOTE — Plan of Care (Signed)

## 2024-08-23 NOTE — Progress Notes (Signed)
 PROGRESS NOTE    [name]  FMW:[id number] DOB: 20-Jan-1966 DOA: 08/22/2024 PCP: Valora Agent, MD   Brief Narrative:  [name] presents to our facility with worsening tachycardia tachypnea and tremors concerning for alcohol withdrawal.  Patient has a known history of asthma, anxiety/depression, alcohol abuse who is attempting to check into [facility] for ongoing medical care and alcohol detox but due to her worsening symptoms reported to our facility for further evaluation and treatment.   Assessment & Plan:   Principal Problem:   Alcohol withdrawal (HCC)  Acute alcohol withdrawal - Patient continuing to have symptoms despite as needed Ativan  - Discussed Librium  taper which she has had more success with in the past, will transition to p.o. Librium  taper with ongoing CIWA protocol and IV Ativan  as needed only for profound symptoms. - If patient stabilizes on p.o. Librium  taper would expect her to be stable for discharge in the next 24 to 48 hours - Discussed cessation, she is hoping to be able to check into [facility] as above for ongoing detox/assistance  Anxiety/depression - Restart home regimen including bupropion , buspirone , fluoxetine , lamotrigine   Asthma - Continue Breztri ignacio  DVT prophylaxis: enoxaparin  (LOVENOX ) injection 40 mg Start: 08/23/24 1000 Code Status:   Code Status: Full Code Family Communication: None present  Status is: Inpatient  Dispo: The patient is from: Home              Anticipated d/c is to: To be determined              Anticipated d/c date is: 24 to 48 hours              Patient currently not medically stable for discharge  Consultants:  None  Procedures:  None  Antimicrobials:  None indicated  Subjective: No acute issues or events overnight denies nausea vomiting diarrhea constipation headache fevers chills or chest pain.  Tremors appear to be improving but not yet resolved.  Continues to be anxious but denies  any uncontrolled symptoms  Objective: Vitals:   08/23/24 0100 08/23/24 0104 08/23/24 0446 08/23/24 0738  BP:  121/69 120/76   Pulse:  (!) 115 (!) 119   Resp: 19 (!) 22 16   Temp:  98 F (36.7 C) 98.4 F (36.9 C)   TempSrc:  Oral Oral   SpO2: (!) 89% 94% 95% 97%  Weight:      Height:        Intake/Output Summary (Last 24 hours) at 08/23/2024 0805 Last data filed at 08/23/2024 0000 Gross per 24 hour  Intake 27.7 ml  Output --  Net 27.7 ml   Filed Weights   08/22/24 1904  Weight: 59.9 kg    Examination:  General:  Pleasantly resting in bed, No acute distress. HEENT:  Normocephalic atraumatic.  Sclerae nonicteric, noninjected.  Extraocular movements intact bilaterally. Neck:  Without mass or deformity.  Trachea is midline. Lungs:  Clear to auscultate bilaterally without rhonchi, wheeze, or rales. Heart:  Regular rate and rhythm.  Without murmurs, rubs, or gallops. Abdomen:  Soft, nontender, nondistended.  Without guarding or rebound. Extremities: Without cyanosis, clubbing, edema, or obvious deformity. Skin:  Warm and dry, no erythema.  Data Reviewed: I have personally reviewed following labs and imaging studies  CBC: Recent Labs  Lab 08/22/24 2006 08/23/24 0408  WBC 9.0 6.7  NEUTROABS 6.3  --   HGB 15.3* 14.5  HCT 47.8* 45.7  MCV 95.4 96.8  PLT 226 193   Basic Metabolic  Panel: Recent Labs  Lab 08/22/24 2006 08/23/24 0408  NA 141 139  K 3.5 3.7  CL 100 101  CO2 22 20*  GLUCOSE 135* 177*  BUN 13 14  CREATININE 0.56 0.62  CALCIUM  8.6* 8.8*  MG  --  2.2  PHOS  --  3.4   GFR: Estimated Creatinine Clearance: 69.8 mL/min (by C-G formula based on SCr of 0.62 mg/dL). Liver Function Tests: Recent Labs  Lab 08/22/24 2006 08/23/24 0408  AST 28 24  ALT 14 12  ALKPHOS 120 111  BILITOT 0.7 0.7  PROT 6.3* 6.0*  ALBUMIN 4.3 4.0    No results found for this or any previous visit (from the past 240 hours).   Radiology Studies: CT Head Wo  Contrast Result Date: 08/22/2024 EXAM: CT HEAD, FACIAL BONES AND CERVICAL SPINE WITHOUT CONTRAST 08/22/2024 08:39:30 PM TECHNIQUE: CT of the head, facial bones and cervical spine was performed without the administration of intravenous contrast. Multiplanar reformatted images are provided for review. Automated exposure control, iterative reconstruction, and/or weight based adjustment of the mA/kV was utilized to reduce the radiation dose to as low as reasonably achievable. COMPARISON: None available. CLINICAL HISTORY: Head trauma, moderate-severe. Patient brought in by medic. Per medic [facility] with bruising on the right side of her head right buttock from a fall a few days ago from drinking per patient and wheezing. Patient c/o back and neck pain reported. Albuterol  duo ; neb mag and zofran  administered by medic. Hx COPD FINDINGS: CT HEAD BRAIN AND VENTRICLES: No acute intracranial hemorrhage. No mass effect or midline shift. No extra-axial fluid collection. No evidence of acute infarct. No hydrocephalus. SKULL AND SCALP: No acute skull fracture. No scalp hematoma. CT FACIAL BONES FACIAL BONES: No acute facial fracture. No mandibular dislocation. No suspicious bone lesion. ORBITS: No acute traumatic injury. SINUSES AND MASTOIDS: No acute abnormality. SOFT TISSUES: No acute abnormality. CT CERVICAL SPINE BONES AND ALIGNMENT: No acute fracture or traumatic malalignment. DEGENERATIVE CHANGES: Mild degenerative changes of the lower cervical spine, noting motion degradation. SOFT TISSUES: No prevertebral soft tissue swelling. IMPRESSION: 1. Motion degraded images. 2. No acute intracranial abnormality. 3. No traumatic injury of the cervical spine. 4. No acute fracture of the facial bones. Electronically signed by: Pinkie Pebbles MD 08/22/2024 08:47 PM EDT RP Workstation: [id number]   CT Cervical Spine Wo Contrast Result Date: 08/22/2024 EXAM: CT HEAD, FACIAL BONES AND CERVICAL SPINE WITHOUT  CONTRAST 08/22/2024 08:39:30 PM TECHNIQUE: CT of the head, facial bones and cervical spine was performed without the administration of intravenous contrast. Multiplanar reformatted images are provided for review. Automated exposure control, iterative reconstruction, and/or weight based adjustment of the mA/kV was utilized to reduce the radiation dose to as low as reasonably achievable. COMPARISON: None available. CLINICAL HISTORY: Head trauma, moderate-severe. Patient brought in by medic. Per medic patient is [facility] with bruising on the right side of her head right buttock from a fall a few days ago from drinking per patient and wheezing. Patient c/o back and neck pain reported. Albuterol  duo ; neb mag and zofran  administered by [employer]. Hx COPD FINDINGS: CT HEAD BRAIN AND VENTRICLES: No acute intracranial hemorrhage. No mass effect or midline shift. No extra-axial fluid collection. No evidence of acute infarct. No hydrocephalus. SKULL AND SCALP: No acute skull fracture. No scalp hematoma. CT FACIAL BONES FACIAL BONES: No acute facial fracture. No mandibular dislocation. No suspicious bone lesion. ORBITS: No acute traumatic injury. SINUSES AND MASTOIDS: No acute  abnormality. SOFT TISSUES: No acute abnormality. CT CERVICAL SPINE BONES AND ALIGNMENT: No acute fracture or traumatic malalignment. DEGENERATIVE CHANGES: Mild degenerative changes of the lower cervical spine, noting motion degradation. SOFT TISSUES: No prevertebral soft tissue swelling. IMPRESSION: 1. Motion degraded images. 2. No acute intracranial abnormality. 3. No traumatic injury of the cervical spine. 4. No acute fracture of the facial bones. Electronically signed by: Pinkie Pebbles MD 08/22/2024 08:47 PM EDT RP Workstation: [id number]   CT Maxillofacial Wo Contrast Result Date: 08/22/2024 EXAM: CT HEAD, FACIAL BONES AND CERVICAL SPINE WITHOUT CONTRAST 08/22/2024 08:39:30 PM TECHNIQUE: CT of the head, facial bones and cervical spine  was performed without the administration of intravenous contrast. Multiplanar reformatted images are provided for review. Automated exposure control, iterative reconstruction, and/or weight based adjustment of the mA/kV was utilized to reduce the radiation dose to as low as reasonably achievable. COMPARISON: None available. CLINICAL HISTORY: Head trauma, moderate-severe. Patient brought in by medic. Per medic patient is [facility] with bruising on the right side of her head right buttock from a fall a few days ago from drinking per patient and wheezing. Patient c/o back and neck pain reported. Albuterol  duo ; neb mag and zofran  administered by [employer]. Hx COPD FINDINGS: CT HEAD BRAIN AND VENTRICLES: No acute intracranial hemorrhage. No mass effect or midline shift. No extra-axial fluid collection. No evidence of acute infarct. No hydrocephalus. SKULL AND SCALP: No acute skull fracture. No scalp hematoma. CT FACIAL BONES FACIAL BONES: No acute facial fracture. No mandibular dislocation. No suspicious bone lesion. ORBITS: No acute traumatic injury. SINUSES AND MASTOIDS: No acute abnormality. SOFT TISSUES: No acute abnormality. CT CERVICAL SPINE BONES AND ALIGNMENT: No acute fracture or traumatic malalignment. DEGENERATIVE CHANGES: Mild degenerative changes of the lower cervical spine, noting motion degradation. SOFT TISSUES: No prevertebral soft tissue swelling. IMPRESSION: 1. Motion degraded images. 2. No acute intracranial abnormality. 3. No traumatic injury of the cervical spine. 4. No acute fracture of the facial bones. Electronically signed by: Pinkie Pebbles MD 08/22/2024 08:47 PM EDT RP Workstation: [id number]   Scheduled Meds:  budesonide   0.5 mg Inhalation Daily   enoxaparin  (LOVENOX ) injection  40 mg Subcutaneous Q24H   folic acid   1 mg Oral Daily   multivitamin with minerals  1 tablet Oral Daily   nicotine   14 mg Transdermal Daily   thiamine   100 mg Oral Daily   Or   thiamine   100 mg  Intravenous Daily   Continuous Infusions:  lactated ringers  Stopped (08/22/24 2359)     LOS: 1 day   Time spent:  Elsie JAYSON Montclair, DO [facility]  If 7PM-7AM, please contact night-coverage [email or web address]  08/23/2024, 8:05 AM

## 2024-08-24 DIAGNOSIS — F10939 Alcohol use, unspecified with withdrawal, unspecified: Secondary | ICD-10-CM | POA: Diagnosis not present

## 2024-08-24 LAB — RENAL FUNCTION PANEL
Albumin: 3.7 g/dL (ref 3.5–5.0)
Anion gap: 10 (ref 5–15)
BUN: 18 mg/dL (ref 6–20)
CO2: 27 mmol/L (ref 22–32)
Calcium: 8.8 mg/dL — ABNORMAL LOW (ref 8.9–10.3)
Chloride: 105 mmol/L (ref 98–111)
Creatinine, Ser: 0.64 mg/dL (ref 0.44–1.00)
GFR, Estimated: >60 mL/min (ref >60)
Glucose, Bld: 95 mg/dL (ref 70–99)
Phosphorus: 2.6 mg/dL (ref 2.5–4.6)
Potassium: 4.2 mmol/L (ref 3.5–5.1)
Sodium: 143 mmol/L (ref 135–145)

## 2024-08-24 LAB — URINE DRUG SCREEN
Amphetamines: NEGATIVE
Barbiturates: NEGATIVE
Benzodiazepines: POSITIVE — AB
Cocaine: NEGATIVE
Fentanyl: NEGATIVE
Methadone Scn, Ur: NEGATIVE
Opiates: NEGATIVE
Tetrahydrocannabinol: NEGATIVE

## 2024-08-24 MED ORDER — CHLORDIAZEPOXIDE HCL 25 MG PO CAPS
25.0000 mg | ORAL_CAPSULE | Freq: Two times a day (BID) | ORAL | Status: DC
  Administered 2024-08-26 – 2024-08-27 (×3): 25 mg via ORAL
  Filled 2024-08-24 (×3): qty 1

## 2024-08-24 MED ORDER — ALBUTEROL SULFATE (2.5 MG/3ML) 0.083% IN NEBU
2.5000 mg | INHALATION_SOLUTION | Freq: Three times a day (TID) | RESPIRATORY_TRACT | Status: DC
  Administered 2024-08-24 – 2024-08-27 (×10): 2.5 mg via RESPIRATORY_TRACT
  Filled 2024-08-24 (×10): qty 3

## 2024-08-24 MED ORDER — CHLORDIAZEPOXIDE HCL 25 MG PO CAPS
25.0000 mg | ORAL_CAPSULE | Freq: Four times a day (QID) | ORAL | Status: AC
  Administered 2024-08-24 – 2024-08-25 (×6): 25 mg via ORAL
  Filled 2024-08-24 (×6): qty 1

## 2024-08-24 MED ORDER — ENSURE PLUS HIGH PROTEIN PO LIQD
237.0000 mL | Freq: Two times a day (BID) | ORAL | Status: DC
  Administered 2024-08-25 – 2024-08-26 (×3): 237 mL via ORAL

## 2024-08-24 MED ORDER — CHLORDIAZEPOXIDE HCL 10 MG PO CAPS: 10.0000 mg | ORAL_CAPSULE | Freq: Three times a day (TID) | ORAL | Status: DC

## 2024-08-24 NOTE — Progress Notes (Signed)
 PROGRESS NOTE    [name]  FMW:[id number] DOB: Apr 27, 1966 DOA: 08/22/2024 PCP: Valora Agent, MD   Brief Narrative:  LIALA CODISPOTI presents to our facility with worsening tachycardia tachypnea and tremors concerning for alcohol withdrawal.  Patient has a known history of asthma, anxiety/depression, alcohol abuse who is attempting to check into [facility] for ongoing medical care and alcohol detox but due to her worsening symptoms reported to our facility for further evaluation and treatment.   Assessment & Plan:   Principal Problem:   Alcohol withdrawal (HCC)  Acute alcohol withdrawal - Patient continuing to have symptoms despite as needed Ativan  - Prior success with Librium  taper, initiated on 9/20 - Patient continues to require breakthrough Ativan  for ongoing tremors and agitation. - Discussed cessation, she is hoping to be able to check into [facility] as above for ongoing detox/assistance  Anxiety/depression - Restart home regimen including bupropion , buspirone , fluoxetine , lamotrigine   Asthma - Continue Breztri ignacio  DVT prophylaxis: enoxaparin  (LOVENOX ) injection 40 mg Start: 08/23/24 1000 Code Status:   Code Status: Full Code Family Communication: At bedside  Status is: Inpatient  Dispo: The patient is from: Home              Anticipated d/c is to: To be determined              Anticipated d/c date is: 24 to 48 hours              Patient currently not medically stable for discharge  Consultants:  None  Procedures:  None  Antimicrobials:  None indicated  Subjective: No acute issues or events overnight denies nausea vomiting diarrhea constipation headache fevers chills or chest pain.  Tremors ongoing - worse than yesterday at this time  Objective: Vitals:   08/23/24 2039 08/23/24 2057 08/24/24 0539 08/24/24 0603  BP: (!) 151/84  (!) 153/86 (!) 153/86  Pulse: (!) 101  (!) 104   Resp: 20  18   Temp: 98.5 F (36.9 C)  98.9 F (37.2 C)    TempSrc: Oral  Oral   SpO2: 93% 95% 94%   Weight:      Height:        Intake/Output Summary (Last 24 hours) at 08/24/2024 0756 Last data filed at 08/24/2024 0100 Gross per 24 hour  Intake 2337.68 ml  Output --  Net 2337.68 ml   Filed Weights   08/22/24 1904  Weight: 59.9 kg    Examination:  General:  Pleasantly resting in bed, No acute distress. HEENT:  Normocephalic atraumatic.  Sclerae nonicteric, noninjected.  Extraocular movements intact bilaterally. Neck:  Without mass or deformity.  Trachea is midline. Lungs:  Clear to auscultate bilaterally without rhonchi, wheeze, or rales. Heart:  Regular rate and rhythm.  Without murmurs, rubs, or gallops. Abdomen:  Soft, nontender, nondistended.  Without guarding or rebound. Extremities: Without cyanosis, clubbing, edema, or obvious deformity. Skin:  Warm and dry, no erythema.  Data Reviewed: I have personally reviewed following labs and imaging studies  CBC: Recent Labs  Lab 08/22/24 2006 08/23/24 0408  WBC 9.0 6.7  NEUTROABS 6.3  --   HGB 15.3* 14.5  HCT 47.8* 45.7  MCV 95.4 96.8  PLT 226 193   Basic Metabolic Panel: Recent Labs  Lab 08/22/24 2006 08/23/24 0408 08/24/24 0432  NA 141 139 143  K 3.5 3.7 4.2  CL 100 101 105  CO2 22 20* 27  GLUCOSE 135* 177* 95  BUN 13 14 18   CREATININE 0.56  0.62 0.64  CALCIUM  8.6* 8.8* 8.8*  MG  --  2.2  --   PHOS  --  3.4 2.6   GFR: Estimated Creatinine Clearance: 69.8 mL/min (by C-G formula based on SCr of 0.64 mg/dL). Liver Function Tests: Recent Labs  Lab 08/22/24 2006 08/23/24 0408 08/24/24 0432  AST 28 24  --   ALT 14 12  --   ALKPHOS 120 111  --   BILITOT 0.7 0.7  --   PROT 6.3* 6.0*  --   ALBUMIN 4.3 4.0 3.7    No results found for this or any previous visit (from the past 240 hours).   Radiology Studies: CT Head Wo Contrast Result Date: 08/22/2024 EXAM: CT HEAD, FACIAL BONES AND CERVICAL SPINE WITHOUT CONTRAST 08/22/2024 08:39:30 PM TECHNIQUE: CT of  the head, facial bones and cervical spine was performed without the administration of intravenous contrast. Multiplanar reformatted images are provided for review. Automated exposure control, iterative reconstruction, and/or weight based adjustment of the mA/kV was utilized to reduce the radiation dose to as low as reasonably achievable. COMPARISON: None available. CLINICAL HISTORY: Head trauma, moderate-severe. Patient brought in by medic. Per medic [facility] is [facility] with bruising on the right side of her head right buttock from a fall a few days ago from drinking per patient and wheezing. Patient c/o back and neck pain reported. Albuterol  duo ; neb mag and zofran  administered by [employer]. Hx COPD FINDINGS: CT HEAD BRAIN AND VENTRICLES: No acute intracranial hemorrhage. No mass effect or midline shift. No extra-axial fluid collection. No evidence of acute infarct. No hydrocephalus. SKULL AND SCALP: No acute skull fracture. No scalp hematoma. CT FACIAL BONES FACIAL BONES: No acute facial fracture. No mandibular dislocation. No suspicious bone lesion. ORBITS: No acute traumatic injury. SINUSES AND MASTOIDS: No acute abnormality. SOFT TISSUES: No acute abnormality. CT CERVICAL SPINE BONES AND ALIGNMENT: No acute fracture or traumatic malalignment. DEGENERATIVE CHANGES: Mild degenerative changes of the lower cervical spine, noting motion degradation. SOFT TISSUES: No prevertebral soft tissue swelling. IMPRESSION: 1. Motion degraded images. 2. No acute intracranial abnormality. 3. No traumatic injury of the cervical spine. 4. No acute fracture of the facial bones. Electronically signed by: Pinkie Pebbles MD 08/22/2024 08:47 PM EDT RP Workstation: [id number]   CT Cervical Spine Wo Contrast Result Date: 08/22/2024 EXAM: CT HEAD, FACIAL BONES AND CERVICAL SPINE WITHOUT CONTRAST 08/22/2024 08:39:30 PM TECHNIQUE: CT of the head, facial bones and cervical spine was performed without the administration of  intravenous contrast. Multiplanar reformatted images are provided for review. Automated exposure control, iterative reconstruction, and/or weight based adjustment of the mA/kV was utilized to reduce the radiation dose to as low as reasonably achievable. COMPARISON: None available. CLINICAL HISTORY: Head trauma, moderate-severe. Patient brought in by medic. Per medic [facility] is [facility] with bruising on the right side of her head right buttock from a fall a few days ago from drinking per patient and wheezing. Patient c/o back and neck pain reported. Albuterol  duo ; neb mag and zofran  administered by [employer]. Hx COPD FINDINGS: CT HEAD BRAIN AND VENTRICLES: No acute intracranial hemorrhage. No mass effect or midline shift. No extra-axial fluid collection. No evidence of acute infarct. No hydrocephalus. SKULL AND SCALP: No acute skull fracture. No scalp hematoma. CT FACIAL BONES FACIAL BONES: No acute facial fracture. No mandibular dislocation. No suspicious bone lesion. ORBITS: No acute traumatic injury. SINUSES AND MASTOIDS: No acute abnormality. SOFT TISSUES: No acute abnormality. CT CERVICAL SPINE BONES AND ALIGNMENT:  No acute fracture or traumatic malalignment. DEGENERATIVE CHANGES: Mild degenerative changes of the lower cervical spine, noting motion degradation. SOFT TISSUES: No prevertebral soft tissue swelling. IMPRESSION: 1. Motion degraded images. 2. No acute intracranial abnormality. 3. No traumatic injury of the cervical spine. 4. No acute fracture of the facial bones. Electronically signed by: Pinkie Pebbles MD 08/22/2024 08:47 PM EDT RP Workstation: [id number]   CT Maxillofacial Wo Contrast Result Date: 08/22/2024 EXAM: CT HEAD, FACIAL BONES AND CERVICAL SPINE WITHOUT CONTRAST 08/22/2024 08:39:30 PM TECHNIQUE: CT of the head, facial bones and cervical spine was performed without the administration of intravenous contrast. Multiplanar reformatted images are provided for review. Automated  exposure control, iterative reconstruction, and/or weight based adjustment of the mA/kV was utilized to reduce the radiation dose to as low as reasonably achievable. COMPARISON: None available. CLINICAL HISTORY: Head trauma, moderate-severe. Patient brought in by medic. Per medic patient is [facility] with bruising on the right side of her head right buttock from a fall a few days ago from drinking per patient and wheezing. Patient c/o back and neck pain reported. Albuterol  duo ; neb mag and zofran  administered by [employer]. Hx COPD FINDINGS: CT HEAD BRAIN AND VENTRICLES: No acute intracranial hemorrhage. No mass effect or midline shift. No extra-axial fluid collection. No evidence of acute infarct. No hydrocephalus. SKULL AND SCALP: No acute skull fracture. No scalp hematoma. CT FACIAL BONES FACIAL BONES: No acute facial fracture. No mandibular dislocation. No suspicious bone lesion. ORBITS: No acute traumatic injury. SINUSES AND MASTOIDS: No acute abnormality. SOFT TISSUES: No acute abnormality. CT CERVICAL SPINE BONES AND ALIGNMENT: No acute fracture or traumatic malalignment. DEGENERATIVE CHANGES: Mild degenerative changes of the lower cervical spine, noting motion degradation. SOFT TISSUES: No prevertebral soft tissue swelling. IMPRESSION: 1. Motion degraded images. 2. No acute intracranial abnormality. 3. No traumatic injury of the cervical spine. 4. No acute fracture of the facial bones. Electronically signed by: Pinkie Pebbles MD 08/22/2024 08:47 PM EDT RP Workstation: [id number]   Scheduled Meds:  albuterol   2.5 mg Nebulization TID   budesonide   0.5 mg Inhalation Daily   budesonide -glycopyrrolate -formoterol   2 puff Inhalation BID   buPROPion   150 mg Oral Daily   busPIRone   10 mg Oral BID   chlordiazePOXIDE   25 mg Oral TID   Followed by   chlordiazePOXIDE   25 mg Oral BID   Followed by   NOREEN ON 08/25/2024] chlordiazePOXIDE   25 mg Oral Q1500   enoxaparin  (LOVENOX ) injection  40 mg  Subcutaneous Q24H   FLUoxetine   20 mg Oral q morning   fluticasone   2 spray Each Nare Daily   folic acid   1 mg Oral Daily   lamoTRIgine   50 mg Oral BID   multivitamin with minerals  1 tablet Oral Daily   nicotine   14 mg Transdermal Daily   progesterone   200 mg Oral QHS   thiamine   100 mg Oral Daily   Or   thiamine   100 mg Intravenous Daily   Continuous Infusions:     LOS: 2 days   Time spent:  Elsie JAYSON Montclair, DO [facility]  If 7PM-7AM, please contact night-coverage [email or web address]  08/24/2024, 7:56 AM

## 2024-08-25 ENCOUNTER — Encounter (HOSPITAL_COMMUNITY): Payer: Self-pay | Admitting: Internal Medicine

## 2024-08-25 DIAGNOSIS — F10939 Alcohol use, unspecified with withdrawal, unspecified: Secondary | ICD-10-CM | POA: Diagnosis not present

## 2024-08-25 LAB — URINALYSIS, ROUTINE W REFLEX MICROSCOPIC
Bilirubin Urine: NEGATIVE
Glucose, UA: NEGATIVE mg/dL
Hgb urine dipstick: NEGATIVE
Ketones, ur: NEGATIVE mg/dL
Nitrite: NEGATIVE
Protein, ur: NEGATIVE mg/dL
Specific Gravity, Urine: 1.014 (ref 1.005–1.030)
pH: 8 (ref 5.0–8.0)

## 2024-08-25 LAB — GLUCOSE, CAPILLARY
Glucose-Capillary: 99 mg/dL (ref 70–99)
Glucose-Capillary: 91 mg/dL (ref 70–99)

## 2024-08-25 MED ORDER — SODIUM CHLORIDE 0.45 % IV SOLN
INTRAVENOUS | Status: AC
Start: 2024-08-25 — End: 2024-08-26

## 2024-08-25 NOTE — Progress Notes (Signed)
 PROGRESS NOTE    [name]  FMW:[id number] DOB: [birth year] DOA: 08/22/2024 PCP: Valora Agent, MD   Brief Narrative:  [name] presents to our facility with worsening tachycardia tachypnea and tremors concerning for alcohol withdrawal.  Patient has a known history of asthma, anxiety/depression, alcohol abuse who is attempting to check into [facility] for ongoing medical care and alcohol detox but due to her worsening symptoms reported to our facility for further evaluation and treatment.   Assessment & Plan:   Acute alcohol withdrawal Patient was started on CIWA protocol.   She was noted to be tachycardic.  Heart rate has improved.  However she still is very tremulous.  Feels very deconditioned and weak.  Appetite is poor. Patient is on Librium  which will be continued.  Also on Ativan  as needed. Start mobilizing. Noted to be dehydrated.  Will give her IV fluids.  UA reviewed.  In the absence of symptoms will not initiate antibiotics.  Anxiety/depression Patient is noted to be on multiple psychotropic agents which are being continued.  This includes bupropion , buspirone , fluoxetine , lamotrigine .  Asthma Continue Breztri /inhalers.  Respiratory status is stable.  DVT prophylaxis: Lovenox  Code Status: Full code Family Communication: None present Disposition: Hopefully return home when improved  Consultants:  None  Procedures:  None  Antimicrobials:  None indicated  Subjective: Feels weak.  Denies any dizziness or lightheadedness.  States that her urine is dark.  Denies any dysuria or frequency.  Objective: Vitals:   08/24/24 2038 08/25/24 0507 08/25/24 0731 08/25/24 0817  BP: 111/68 130/78  118/68  Pulse: 90 76  (!) 103  Resp: 18 18    Temp: 98.5 F (36.9 C) 97.8 F (36.6 C)    TempSrc: Oral Oral    SpO2: 94% 93% 94%   Weight:      Height:        Intake/Output Summary (Last 24 hours) at 08/25/2024 1110 Last data filed at 08/25/2024 0932 Gross per  24 hour  Intake 360 ml  Output 400 ml  Net -40 ml   Filed Weights   08/22/24 1904  Weight: 59.9 kg    Examination:  General appearance: Awake alert.  In no distress Resp: Clear to auscultation bilaterally.  Normal effort Cardio: S1-S2 is normal regular.  No S3-S4.  No rubs murmurs or bruit GI: Abdomen is soft.  Nontender nondistended.  Bowel sounds are present normal.  No masses organomegaly Extremities: No edema.  Good deconditioning noted. Alert and oriented x 3.  No focal neurological deficits noted.  Data Reviewed: I have personally reviewed following labs and imaging studies  CBC: Recent Labs  Lab 08/22/24 2006 08/23/24 0408  WBC 9.0 6.7  NEUTROABS 6.3  --   HGB 15.3* 14.5  HCT 47.8* 45.7  MCV 95.4 96.8  PLT 226 193   Basic Metabolic Panel: Recent Labs  Lab 08/22/24 2006 08/23/24 0408 08/24/24 0432  NA 141 139 143  K 3.5 3.7 4.2  CL 100 101 105  CO2 22 20* 27  GLUCOSE 135* 177* 95  BUN 13 14 18   CREATININE 0.56 0.62 0.64  CALCIUM  8.6* 8.8* 8.8*  MG  --  2.2  --   PHOS  --  3.4 2.6   GFR: Estimated Creatinine Clearance: 69.8 mL/min (by C-G formula based on SCr of 0.64 mg/dL).  Liver Function Tests: Recent Labs  Lab 08/22/24 2006 08/23/24 0408 08/24/24 0432  AST 28 24  --   ALT 14 12  --  ALKPHOS 120 111  --   BILITOT 0.7 0.7  --   PROT 6.3* 6.0*  --   ALBUMIN 4.3 4.0 3.7    Radiology Studies: No results found.  Scheduled Meds:  albuterol   2.5 mg Nebulization TID   budesonide   0.5 mg Inhalation Daily   budesonide -glycopyrrolate -formoterol   2 puff Inhalation BID   buPROPion   150 mg Oral Daily   busPIRone   10 mg Oral BID   [START ON 08/28/2024] chlordiazePOXIDE   10 mg Oral TID   chlordiazePOXIDE   25 mg Oral QID   [START ON 08/26/2024] chlordiazePOXIDE   25 mg Oral BID   enoxaparin  (LOVENOX ) injection  40 mg Subcutaneous Q24H   feeding supplement  237 mL Oral BID BM   FLUoxetine   20 mg Oral q morning   fluticasone   2 spray Each Nare  Daily   folic acid   1 mg Oral Daily   lamoTRIgine   50 mg Oral BID   multivitamin with minerals  1 tablet Oral Daily   nicotine   14 mg Transdermal Daily   progesterone   200 mg Oral QHS   thiamine   100 mg Oral Daily   Or   thiamine   100 mg Intravenous Daily   Continuous Infusions:  sodium chloride  100 mL/hr at 08/25/24 1038     LOS: 3 days    Joette Pebbles,  [facility]  If 7PM-7AM, please contact night-coverage [email or web address]  08/25/2024, 11:10 AM

## 2024-08-25 NOTE — TOC Initial Note (Signed)
 Transition of Care [facility]) - Initial/Assessment Note   Patient Details  Name: [name] MRN: [id number] Date of Birth: 26-Sep-1966  [facility]) CM/SW Contact:    Duwaine GORMAN Aran, LCSW Phone Number: 08/25/2024, 3:27 PM  Clinical Narrative: Patient was at [facility] prior to this hospital admission. Patient confirmed she plans to return to [facility] when medically ready. Care management to follow.  Expected Discharge Plan: Home/Self Care (Return to [facility]) Barriers to Discharge: Continued Medical Work up  Patient Goals and CMS Choice Patient states their goals for this hospitalization and ongoing recovery are:: Return to [name] Choice offered to / list presented to : NA  Expected Discharge Plan and Services In-house Referral: Clinical Social Work Post Acute Care Choice: NA Living arrangements for the past 2 months: Single Family Home            DME Arranged: N/A DME Agency: NA  Prior Living Arrangements/Services Living arrangements for the past 2 months: Single Family Home Lives with:: Spouse Patient language and need for interpreter reviewed:: Yes Do you feel safe going back to the place where you live?: Yes      Need for Family Participation in Patient Care: No (Comment) Care giver support system in place?: Yes (comment) Criminal Activity/Legal Involvement Pertinent to Current Situation/Hospitalization: No - Comment as needed  Activities of Daily Living ADL Screening (condition at time of admission) Independently performs ADLs?: Yes (appropriate for developmental age) Is the patient deaf or have difficulty hearing?: No Does the patient have difficulty seeing, even when wearing glasses/contacts?: No Does the patient have difficulty concentrating, remembering, or making decisions?: No  Permission Sought/Granted Permission sought to share information with : Facility Industrial/product designer granted to share information with :  Yes, Verbal Permission Granted Permission granted to share info w AGENCY: [facility]  Emotional Assessment Attitude/Demeanor/Rapport: Engaged Affect (typically observed): Accepting Orientation: : Oriented to Self, Oriented to Place, Oriented to  Time, Oriented to Situation Alcohol / Substance Use: Alcohol Use Psych Involvement: No (comment)  Admission diagnosis:  Alcohol withdrawal (HCC) [F10.939] ETOH abuse [F10.10] Fall, initial encounter [W19.XXXA] Patient Active Problem List   Diagnosis Date Noted   Alcohol abuse 01/10/2024   Asthma attacks lasting more than 24 hours 11/26/2023   Major depressive disorder, 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/22/2023   Alcohol withdrawal (HCC) 04/10/2023   Asthma exacerbation 04/09/2023   Alcohol use disorder 12/26/2022   Asthma 12/26/2022   MDD (major depressive disorder), recurrent episode, severe (HCC) 12/25/2022   Depression, unspecified 01/06/2021   Alcohol use 01/06/2021   Insomnia 06/18/2020   SUI (stress urinary incontinence, female)    Hypertension    History of anemia    GERD (gastroesophageal reflux disease)    History of alcoholism (HCC) 02/25/2020   Intractable migraine with aura without status migrainosus 02/25/2020   Attention deficit hyperactivity disorder (ADHD), predominantly inattentive type 01/07/2020   GAD (generalized anxiety disorder) 01/07/2020   MDD (major depressive disorder), recurrent severe, without psychosis (HCC) 01/07/2020   Diverticulitis with abscess/fistula s/p robotic left colectomy 06/16/2020 09/06/2019   MDD (major depressive disorder) 10/07/2015   Alcohol abuse with alcohol-induced mood disorder (HCC) 10/06/2015   PCP:  Valora Agent, MD Pharmacy:   [facility] [phone] - [address] [place] - [address] [address] Phone: [phone] Fax: [phone]  Social Drivers of Health (SDOH) Social History: SDOH Screenings   Food Insecurity: No Food  Insecurity (08/23/2024)  Housing: Low Risk  (  08/23/2024)  Transportation Needs: No Transportation Needs (08/23/2024)  Utilities: Not At Risk (08/23/2024)  Alcohol Screen: Medium Risk (08/22/2023)  Depression (PHQ2-9): High Risk (01/12/2024)  Financial Resource Strain: Low Risk  (04/22/2024)   Received from [facility]  Tobacco Use: High Risk (08/25/2024)   SDOH Interventions: Food Insecurity Interventions: Intervention Not Indicated, Inpatient TOC Housing Interventions: Intervention Not Indicated, Inpatient TOC Transportation Interventions: Intervention Not Indicated, Inpatient TOC Utilities Interventions: Intervention Not Indicated, Inpatient TOC  Readmission Risk Interventions     No data to display

## 2024-08-26 DIAGNOSIS — F10939 Alcohol use, unspecified with withdrawal, unspecified: Secondary | ICD-10-CM | POA: Diagnosis not present

## 2024-08-26 LAB — COMPREHENSIVE METABOLIC PANEL WITH GFR
ALT: 41 U/L (ref 0–44)
AST: 62 U/L — ABNORMAL HIGH (ref 15–41)
Albumin: 3.8 g/dL (ref 3.5–5.0)
Alkaline Phosphatase: 127 U/L — ABNORMAL HIGH (ref 38–126)
Anion gap: 12 (ref 5–15)
BUN: 17 mg/dL (ref 6–20)
CO2: 23 mmol/L (ref 22–32)
Calcium: 9.3 mg/dL (ref 8.9–10.3)
Chloride: 104 mmol/L (ref 98–111)
Creatinine, Ser: 0.72 mg/dL (ref 0.44–1.00)
GFR, Estimated: >60 mL/min (ref >60)
Glucose, Bld: 85 mg/dL (ref 70–99)
Potassium: 4.2 mmol/L (ref 3.5–5.1)
Sodium: 139 mmol/L (ref 135–145)
Total Bilirubin: 0.5 mg/dL (ref 0.0–1.2)
Total Protein: 5.7 g/dL — ABNORMAL LOW (ref 6.5–8.1)

## 2024-08-26 LAB — MAGNESIUM: Magnesium: 2 mg/dL (ref 1.7–2.4)

## 2024-08-26 LAB — CBC
HCT: 43.4 % (ref 36.0–46.0)
Hemoglobin: 13.2 g/dL (ref 12.0–15.0)
MCH: 30 pg (ref 26.0–34.0)
MCHC: 30.4 g/dL (ref 30.0–36.0)
MCV: 98.6 fL (ref 80.0–100.0)
Platelets: 136 K/uL — ABNORMAL LOW (ref 150–400)
RBC: 4.4 MIL/uL (ref 3.87–5.11)
RDW: 12.9 % (ref 11.5–15.5)
WBC: 5.8 K/uL (ref 4.0–10.5)
nRBC: 0 % (ref 0.0–0.2)

## 2024-08-26 MED ORDER — LORAZEPAM 1 MG PO TABS
1.0000 mg | ORAL_TABLET | ORAL | Status: DC | PRN
  Administered 2024-08-26 – 2024-08-27 (×3): 2 mg via ORAL
  Administered 2024-08-27: 1 mg via ORAL
  Filled 2024-08-26: qty 1
  Filled 2024-08-26 (×2): qty 2

## 2024-08-26 MED ORDER — LORAZEPAM 2 MG/ML IJ SOLN: 1.0000 mg | INTRAMUSCULAR | Status: AC | PRN

## 2024-08-26 MED ORDER — SODIUM CHLORIDE 0.45 % IV SOLN: INTRAVENOUS | Status: AC

## 2024-08-26 MED ORDER — LORAZEPAM 2 MG/ML IJ SOLN: 1.0000 mg | INTRAMUSCULAR | Status: DC | PRN

## 2024-08-26 MED ORDER — LORAZEPAM 1 MG PO TABS
1.0000 mg | ORAL_TABLET | ORAL | Status: AC | PRN
Start: 2024-08-26 — End: 2024-08-26
  Administered 2024-08-26: 2 mg via ORAL
  Filled 2024-08-26 (×3): qty 2

## 2024-08-26 NOTE — Progress Notes (Signed)
 PROGRESS NOTE    [name]  FMW:[id number] DOB: 03-17-1966 DOA: 08/22/2024 PCP: Valora Agent, MD   Brief Narrative:  [name] presents to our facility with worsening tachycardia tachypnea and tremors concerning for alcohol withdrawal.  Patient has a known history of asthma, anxiety/depression, alcohol abuse who is attempting to check into [facility] for ongoing medical care and alcohol detox but due to her worsening symptoms reported to our facility for further evaluation and treatment.   Assessment & Plan:   Acute alcohol withdrawal Patient was started on CIWA protocol.   She was noted to be tachycardic.  Heart rate has improved. Still quite weak and deconditioned. Could not ambulate in hallway. Will continue IVF for another day. PT eval.  Patient is on Librium  which will be continued.  Also on Ativan  as needed. UA reviewed and no evidence for infection. Will check labs today.  Anxiety/depression Patient is noted to be on multiple psychotropic agents which are being continued.  This includes bupropion , buspirone , fluoxetine , lamotrigine .  Asthma Continue Breztri /inhalers.  Respiratory status is stable.  DVT prophylaxis: Lovenox  Code Status: Full code Family Communication: None present Disposition: Hopefully return home when improved  Consultants:  None  Procedures:  None  Antimicrobials:  None indicated  Subjective: Continues to feel weak and dizzy at times.  Objective: Vitals:   08/26/24 0207 08/26/24 0450 08/26/24 0858 08/26/24 0903  BP: 124/72 126/78    Pulse: 98 74    Resp:  16    Temp:  97.8 F (36.6 C)    TempSrc:  Oral    SpO2:  100% 99% 100%  Weight:      Height:        Intake/Output Summary (Last 24 hours) at 08/26/2024 1159 Last data filed at 08/26/2024 1130 Gross per 24 hour  Intake 2540.49 ml  Output 2900 ml  Net -359.51 ml   Filed Weights   08/22/24 1904  Weight: 59.9 kg    Examination:  Awake alert CTA  bilaterally S1S2 regular Abdomen is soft, NT No focal deficits.   Data Reviewed:   CBC: Recent Labs  Lab 08/22/24 2006 08/23/24 0408  WBC 9.0 6.7  NEUTROABS 6.3  --   HGB 15.3* 14.5  HCT 47.8* 45.7  MCV 95.4 96.8  PLT 226 193   Basic Metabolic Panel: Recent Labs  Lab 08/22/24 2006 08/23/24 0408 08/24/24 0432  NA 141 139 143  K 3.5 3.7 4.2  CL 100 101 105  CO2 22 20* 27  GLUCOSE 135* 177* 95  BUN 13 14 18   CREATININE 0.56 0.62 0.64  CALCIUM  8.6* 8.8* 8.8*  MG  --  2.2  --   PHOS  --  3.4 2.6   GFR: Estimated Creatinine Clearance: 69.8 mL/min (by C-G formula based on SCr of 0.64 mg/dL).  Liver Function Tests: Recent Labs  Lab 08/22/24 2006 08/23/24 0408 08/24/24 0432  AST 28 24  --   ALT 14 12  --   ALKPHOS 120 111  --   BILITOT 0.7 0.7  --   PROT 6.3* 6.0*  --   ALBUMIN 4.3 4.0 3.7    Radiology Studies: No results found.  Scheduled Meds:  albuterol   2.5 mg Nebulization TID   budesonide   0.5 mg Inhalation Daily   budesonide -glycopyrrolate -formoterol   2 puff Inhalation BID   buPROPion   150 mg Oral Daily   busPIRone   10 mg Oral BID   [START ON 08/28/2024] chlordiazePOXIDE   10 mg Oral TID  chlordiazePOXIDE   25 mg Oral BID   enoxaparin  (LOVENOX ) injection  40 mg Subcutaneous Q24H   feeding supplement  237 mL Oral BID BM   FLUoxetine   20 mg Oral q morning   fluticasone   2 spray Each Nare Daily   folic acid   1 mg Oral Daily   lamoTRIgine   50 mg Oral BID   multivitamin with minerals  1 tablet Oral Daily   nicotine   14 mg Transdermal Daily   progesterone   200 mg Oral QHS   thiamine   100 mg Oral Daily   Or   thiamine   100 mg Intravenous Daily   Continuous Infusions:     LOS: 4 days    Joette Pebbles,  [facility]  If 7PM-7AM, please contact night-coverage [email or web address]  08/26/2024, 11:59 AM

## 2024-08-26 NOTE — Evaluation (Signed)
 Physical Therapy Evaluation Patient Details Name: [name] MRN: [id number] DOB: Mar 31, 1966 Today's Date: 08/26/2024  History of Present Illness  58 yo female who presents to our facility with worsening tachycardia tachypnea and tremors concerning for alcohol withdrawal.  pt was attempting to check into [facility] for ongoing medical care and alcohol detox but due to her worsening symptoms reported to our facility for further evaluation PMH: asthma, anxiety/depression, alcohol abuse  Clinical Impression  Pt admitted with above diagnosis.  Pt reports she is independent at baseline. Pt states she is feeling anxious/restless, RN aware pt requesting meds.  Pt amb ~ 100' with RW and min assist-CGA, unsteady however no overt LOB.  Anticipate pt will not need post acute PT f/u; She reports plan is [facility] vs [facility].  Will continue to follow along with mobility team in acute setting.    Pt currently with functional limitations due to the deficits listed below (see PT Problem List). Pt will benefit from acute skilled PT to increase their independence and safety with mobility to allow discharge.           If plan is discharge home, recommend the following:     Can travel by private vehicle        Equipment Recommendations None recommended by PT  Recommendations for Other Services       Functional Status Assessment Patient has had a recent decline in their functional status and demonstrates the ability to make significant improvements in function in a reasonable and predictable amount of time.     Precautions / Restrictions Precautions Precautions: Fall      Mobility  Bed Mobility Overal bed mobility: Needs Assistance Bed Mobility: Supine to Sit, Sit to Supine     Supine to sit: Supervision Sit to supine: Supervision   General bed mobility comments: for safety, decr awareness of lines    Transfers Overall transfer level: Needs assistance Equipment used: None,  Rolling walker (2 wheels) Transfers: Sit to/from Stand Sit to Stand: Supervision           General transfer comment: for safety; STS from bed and toilet; RW used on 2nd trial    Ambulation/Gait Ambulation/Gait assistance: Contact guard assist, Min assist Gait Distance (Feet): 100 Feet Assistive device: Rolling walker (2 wheels) Gait Pattern/deviations: Decreased step length - left, Decreased step length - right, Drifts right/left, Scissoring, Narrow base of support Gait velocity: decr     General Gait Details: amb short distance with min assist and no device; transitioned to RW with improved stability but continued scissoring at times, unsteady although no overt LOB, min assist to CGA to balance and manage RW  Stairs            Wheelchair Mobility     Tilt Bed    Modified Rankin (Stroke Patients Only)       Balance Overall balance assessment: Needs assistance Sitting-balance support: No upper extremity supported, Feet supported Sitting balance-Leahy Scale: Good     Standing balance support: No upper extremity supported, During functional activity, Reliant on assistive device for balance Standing balance-Leahy Scale: Fair                               Pertinent Vitals/Pain Pain Assessment Pain Assessment: Faces Pain Location: generalized discomfort Pain Descriptors / Indicators: Grimacing Pain Intervention(s): Monitored during session    Home Living Family/patient expects to be discharged to:: Private residence Living Arrangements:  Alone Available Help at Discharge: Family Type of Home: House Home Access: Stairs to enter Entrance Stairs-Rails: Left Entrance Stairs-Number of Steps: 3   Home Layout: One level Home Equipment: None      Prior Function Prior Level of Function : Independent/Modified Independent                     Extremity/Trunk Assessment   Upper Extremity Assessment Upper Extremity Assessment: Overall WFL for  tasks assessed    Lower Extremity Assessment Lower Extremity Assessment: Overall WFL for tasks assessed    Cervical / Trunk Assessment Cervical / Trunk Assessment: Normal  Communication   Communication Communication: No apparent difficulties    Cognition Arousal: Alert Behavior During Therapy: Anxious   PT - Cognitive impairments: No apparent impairments                         Following commands: Intact       Cueing Cueing Techniques: Verbal cues     General Comments      Exercises     Assessment/Plan    PT Assessment Patient needs continued PT services  PT Problem List Decreased activity tolerance;Decreased balance;Decreased mobility       PT Treatment Interventions DME instruction;Gait training;Functional mobility training;Therapeutic activities;Therapeutic exercise;Patient/family education    PT Goals (Current goals can be found in the Care Plan section)  Acute Rehab PT Goals Patient Stated Goal: home with OP [facility] f/u vs [facility] PT Goal Formulation: With patient Time For Goal Achievement: 09/08/24 Potential to Achieve Goals: Good    Frequency Min 3X/week     Co-evaluation               AM-PAC PT 6 Clicks Mobility  Outcome Measure Help needed turning from your back to your side while in a flat bed without using bedrails?: None Help needed moving from lying on your back to sitting on the side of a flat bed without using bedrails?: None Help needed moving to and from a bed to a chair (including a wheelchair)?: A Little Help needed standing up from a chair using your arms (e.g., wheelchair or bedside chair)?: A Little Help needed to walk in hospital room?: A Little Help needed climbing 3-5 steps with a railing? : A Little 6 Click Score: 20    End of Session Equipment Utilized During Treatment: Gait belt Activity Tolerance: Patient tolerated treatment well Patient left: in bed;with call bell/phone within reach;with bed alarm  set Nurse Communication: Mobility status;Other (comment) (pt asking for anxiety meds) PT Visit Diagnosis: Other abnormalities of gait and mobility (R26.89)    Time: 1644-1700 PT Time Calculation (min) (ACUTE ONLY): 16 min   Charges:   PT Evaluation $PT Eval Low Complexity: 1 Low   PT General Charges $$ ACUTE PT VISIT: 1 Visit         Eswin Worrell, PT  Acute Rehab Dept [facility]) [phone]  08/26/2024   [facility] 08/26/2024, 5:18 PM

## 2024-08-27 ENCOUNTER — Other Ambulatory Visit (HOSPITAL_COMMUNITY): Payer: Self-pay

## 2024-08-27 LAB — CBC
HCT: 41.7 % (ref 36.0–46.0)
Hemoglobin: 13.4 g/dL (ref 12.0–15.0)
MCH: 31.6 pg (ref 26.0–34.0)
MCHC: 32.1 g/dL (ref 30.0–36.0)
MCV: 98.3 fL (ref 80.0–100.0)
Platelets: 130 K/uL — ABNORMAL LOW (ref 150–400)
RBC: 4.24 MIL/uL (ref 3.87–5.11)
RDW: 13.2 % (ref 11.5–15.5)
WBC: 5.7 K/uL (ref 4.0–10.5)
nRBC: 0 % (ref 0.0–0.2)

## 2024-08-27 MED ORDER — CHLORDIAZEPOXIDE HCL 25 MG PO CAPS
ORAL_CAPSULE | ORAL | 0 refills | Status: DC
  Filled 2024-08-27: qty 9, 6d supply, fill #0

## 2024-08-27 MED ORDER — METHOCARBAMOL 500 MG PO TABS
1000.0000 mg | ORAL_TABLET | Freq: Three times a day (TID) | ORAL | 0 refills | Status: AC | PRN
  Filled 2024-08-27: qty 30, 5d supply, fill #0

## 2024-08-27 NOTE — Evaluation (Signed)
 Occupational Therapy Evaluation Patient Details Name: [name] MRN: [id number] DOB: [birth year] Today's Date: 08/27/2024   History of Present Illness   58 yo female who presents to our facility with worsening tachycardia tachypnea and tremors concerning for alcohol withdrawal.  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 Currently, patient presents with deficits outlined below (see OT Problem List for details) most significantly mild pain, decreased activity tolerance and balance limiting ADL and functional mobility performance. Patient continues to rely on UE support for maintaining balance especially on turns thus RW recommended for discharge.  Patient requires continued Acute care hospital level OT services to progress safety and functional performance and allow for discharge.       If plan is discharge home, recommend the following:   A little help with walking and/or transfers;A little help with bathing/dressing/bathroom;Help with stairs or ramp for entrance;Assist for transportation     Functional Status Assessment   Patient has had a recent decline in their functional status and demonstrates the ability to make significant improvements in function in a reasonable and predictable amount of time.     Equipment Recommendations   Other (comment) (rolling walker)      Precautions/Restrictions   Precautions Precautions: Fall Restrictions Weight Bearing Restrictions Per Provider Order: No     Mobility Bed Mobility Overal bed mobility: Needs Assistance Bed Mobility: Supine to Sit, Sit to Supine     Supine to sit: Supervision Sit to supine:  Supervision   General bed mobility comments: for safety, cues for awareness of lines    Transfers Overall transfer level: Needs assistance Equipment used: None, Rolling walker (2 wheels) Transfers: Sit to/from Stand, Bed to chair/wheelchair/BSC Sit to Stand: Supervision           General transfer comment: cues for increased time to steady self, integrate RW      Balance Overall balance assessment: Needs assistance Sitting-balance support: No upper extremity supported, Feet supported Sitting balance-Leahy Scale: Good     Standing balance support: No upper extremity supported, During functional activity, Reliant on assistive device for balance Standing balance-Leahy Scale: Fair                             ADL either performed or assessed with clinical judgement   ADL Overall ADL's : Needs assistance/impaired Eating/Feeding: Independent   Grooming: Wash/dry hands;Wash/dry face;Oral care;Brushing hair;Applying deodorant;Sitting;Modified independent   Upper Body Bathing: Modified independent;Sitting   Lower Body Bathing: Supervison/ safety;Sit to/from stand   Upper Body Dressing : Modified independent;Sitting   Lower Body Dressing: Supervision/safety;Sit to/from stand   Toilet Transfer: Supervision/safety;Regular Toilet;Rolling walker (2 wheels)   Toileting- Clothing Manipulation and Hygiene: Modified independent;Sitting/lateral lean   Tub/ Shower Transfer: Therapist, sports Details (indicate cue type and reason): recommend use of RW for stability in walk in shower Functional mobility during ADLs: Supervision/safety;Rolling walker (2 wheels) General ADL Comments: recommending RW for balance and safety, cues for breathing and seated figure 4 instruction given     Vision Baseline Vision/History: 0 No visual deficits Ability to See in Adequate Light: 0 Adequate Patient Visual Report: No change from baseline Vision Assessment?: No  apparent visual deficits;Wears glasses for reading            Pertinent Vitals/Pain Pain Assessment Pain Assessment: Faces Faces Pain Scale: Hurts a little bit Breathing: normal Facial Expression: smiling or inexpressive Body Language: relaxed Pain Location: generalized discomfort Pain Descriptors / Indicators: Discomfort Pain Intervention(s): Monitored during session, Heat applied, Limited activity within patient's tolerance, Repositioned     Extremity/Trunk Assessment Upper Extremity Assessment Upper Extremity Assessment: Right hand dominant;Overall WFL for tasks assessed   Lower Extremity Assessment Lower Extremity Assessment: Overall WFL for tasks assessed   Cervical / Trunk Assessment Cervical / Trunk Assessment: Normal   Communication Communication Communication: No apparent difficulties   Cognition Arousal: Alert Behavior During Therapy: Anxious Cognition: No family/caregiver present to determine baseline                               Following commands: Intact       Cueing  General Comments   Cueing Techniques: Verbal cues  minor bruising remains on back and head from fall at home, cues for breathing integ/pacing and energy conservation           Home Living Family/patient expects to be discharged to:: Private residence Living Arrangements: Alone Available Help at Discharge: Family Type of Home: House Home Access: Stairs to enter Secretary/administrator of Steps: 3 Entrance Stairs-Rails: Left Home Layout: One level     Bathroom Shower/Tub: Walk-in Pensions consultant: Standard Bathroom Accessibility: Yes   Home Equipment: None          Prior Functioning/Environment Prior Level of Function : Independent/Modified Independent                    OT Problem List: Impaired balance (sitting and/or standing);Decreased activity tolerance   OT Treatment/Interventions: Self-care/ADL training;Therapeutic  exercise;Energy conservation;DME and/or AE instruction;Therapeutic activities;Patient/family education;Balance training      OT Goals(Current goals can be found in the care plan section)   Acute Rehab OT Goals Patient Stated Goal: to go home OT Goal Formulation: With patient Time For Goal Achievement: 09/10/24 Potential to Achieve Goals: Fair ADL Goals Pt Will Perform Lower Body Bathing: with modified independence;sit to/from stand Pt Will Perform Lower Body Dressing: with modified independence;sit to/from stand Pt Will Transfer to Toilet: with modified independence;regular height toilet;ambulating Pt Will Perform Toileting - Clothing Manipulation and hygiene: with modified independence;sitting/lateral leans Pt Will Perform Tub/Shower Transfer: with modified independence;ambulating;rolling walker Additional ADL Goal #1: Patient will teach back breathing strategies with min cues durign ADL's and mobility.   OT Frequency:  Min 2X/week       AM-PAC OT 6 Clicks Daily Activity     Outcome Measure Help from another person eating meals?: None Help from another person taking care of personal grooming?: None Help from another person toileting, which includes using toliet, bedpan, or urinal?: A Little Help from another person bathing (including washing, rinsing, drying)?: A Little Help from another person to put on and taking off regular upper body clothing?: None Help from another person to put on and taking off regular lower body clothing?: A Little 6 Click Score: 21   End of Session Equipment Utilized During Treatment: Rolling walker (2 wheels);Gait belt Nurse Communication: Other (comment);Mobility status (RW need)  Activity Tolerance: Patient limited by pain Patient left: in bed;with call bell/phone within reach;with bed alarm set;with nursing/sitter in room  OT Visit Diagnosis: Unsteadiness on feet (R26.81);Pain Pain -  Right/Left: Right Pain - part of body: Leg                 Time: 9149-9079 OT Time Calculation (min): 30 min Charges:  OT General Charges $OT Visit: 1 Visit OT Evaluation $OT Eval Low Complexity: 1 Low OT Treatments $Self Care/Home Management : 8-22 mins  Ardell Makarewicz OT/L Acute Rehabilitation Department  [phone]  08/27/2024, 9:32 AM

## 2024-08-27 NOTE — TOC Transition Note (Addendum)
 Transition of Care [facility]) - Discharge Note  Patient Details  Name: [name] MRN: [id number] Date of Birth: 01/10/1966  Transition of [facility] CM/SW Contact:  Duwaine GORMAN Aran, LCSW Phone Number: 08/27/2024, 9:57 AM  Clinical Narrative: CSW met with patient and she reported she will discharge home rather than admitting to [facility]. OT evaluation recommended a rolling walker. Patient agreeable to having [employer] deliver rolling walker to room. CSW made DME referral to Zack with [employer]. [employer] to deliver walker to room before discharge. Care management signing off.  Addendum: CSW notified by Zack with [employer] that patient refused the rolling walker as patient's mother has some DME she can borrow.  Final next level of care: Home/Self Care Barriers to Discharge: Barriers Resolved  Patient Goals and CMS Choice Patient states their goals for this hospitalization and ongoing recovery are:: Discharge home CMS Medicare.gov Compare Post Acute Care list provided to:: Patient Choice offered to / list presented to : Patient  Discharge Plan and Services Additional resources added to the After Visit Summary for   In-house Referral: Clinical Social Work Post Acute Care Choice: NA          DME Arranged: Vannie rolling DME Agency: [employer] Date DME Agency Contacted: 08/27/24 Time DME Agency Contacted: [phone] Representative spoke with at DME Agency: Zack  Social Drivers of Health (SDOH) Interventions SDOH Screenings   Food Insecurity: No Food Insecurity (08/23/2024)  Housing: Low Risk  (08/23/2024)  Transportation Needs: No Transportation Needs (08/23/2024)  Utilities: Not At Risk (08/23/2024)  Alcohol Screen: Medium Risk (08/22/2023)  Depression (PHQ2-9): High Risk (01/12/2024)  Financial Resource Strain: Low Risk  (04/22/2024)   Received from [facility]  Tobacco Use: High Risk (08/25/2024)   Readmission Risk Interventions     No data to display

## 2024-08-27 NOTE — Discharge Summary (Signed)
 [facility]  Physician Discharge Summary   Patient ID: [name] MRN: [id number] DOB/AGE: March 29, 1966 58 y.o.  Admit date: 08/22/2024 Discharge date: 08/27/2024    PCP: Valora Agent, MD  DISCHARGE DIAGNOSES:  Alcohol withdrawal History of asthma History of anxiety and depression  RECOMMENDATIONS FOR OUTPATIENT FOLLOW UP: Outpatient follow-up with PCP   Home Health: None Equipment/Devices: Walker  CODE STATUS: Full code  DISCHARGE CONDITION: fair  Diet recommendation: As before  INITIAL HISTORY: RANELLE AUKER presents to our facility with worsening tachycardia tachypnea and tremors concerning for alcohol withdrawal. Patient has a known history of asthma, anxiety/depression, alcohol abuse who is attempting to check into [facility] for ongoing medical care and alcohol detox but due to her worsening symptoms reported to our facility for further evaluation and treatment.   HOSPITAL COURSE:   Acute alcohol withdrawal Patient was started on CIWA protocol.  She was noted to be tachycardic.  Heart rate has improved.  Was weak and deconditioned.  She was ambulated in the hallway.  Seen by physical therapy.  Ambulatory status has improved.  She feels better this morning.  Okay for discharge home. Will be discharged on Librium  taper.     Anxiety/depression Patient is noted to be on multiple psychotropic agents which are being continued.  This includes bupropion , buspirone , fluoxetine , lamotrigine .   Asthma Continue Breztri ignacio.  Respiratory status is stable.  Patient is stable.  Okay for discharge home today.   PERTINENT LABS:  The results of significant diagnostics from this hospitalization (including imaging, microbiology, ancillary and laboratory) are listed below for reference.     Labs:   Basic Metabolic Panel: Recent Labs  Lab 08/22/24 2006 08/23/24 0408 08/24/24 0432 08/26/24 1248  NA 141 139 143 139  K 3.5 3.7 4.2 4.2  CL 100 101  105 104  CO2 22 20* 27 23  GLUCOSE 135* 177* 95 85  BUN 13 14 18 17   CREATININE 0.56 0.62 0.64 0.72  CALCIUM  8.6* 8.8* 8.8* 9.3  MG  --  2.2  --  2.0  PHOS  --  3.4 2.6  --    Liver Function Tests: Recent Labs  Lab 08/22/24 2006 08/23/24 0408 08/24/24 0432 08/26/24 1248  AST 28 24  --  62*  ALT 14 12  --  41  ALKPHOS 120 111  --  127*  BILITOT 0.7 0.7  --  0.5  PROT 6.3* 6.0*  --  5.7*  ALBUMIN 4.3 4.0 3.7 3.8    CBC: Recent Labs  Lab 08/22/24 2006 08/23/24 0408 08/26/24 1248 08/27/24 0414  WBC 9.0 6.7 5.8 5.7  NEUTROABS 6.3  --   --   --   HGB 15.3* 14.5 13.2 13.4  HCT 47.8* 45.7 43.4 41.7  MCV 95.4 96.8 98.6 98.3  PLT 226 193 136* 130*   CBG: Recent Labs  Lab 08/25/24 1110 08/25/24 1707  GLUCAP 99 91     IMAGING STUDIES CT Head Wo Contrast Result Date: 08/22/2024 EXAM: CT HEAD, FACIAL BONES AND CERVICAL SPINE WITHOUT CONTRAST 08/22/2024 08:39:30 PM TECHNIQUE: CT of the head, facial bones and cervical spine was performed without the administration of intravenous contrast. Multiplanar reformatted images are provided for review. Automated exposure control, iterative reconstruction, and/or weight based adjustment of the mA/kV was utilized to reduce the radiation dose to as low as reasonably achievable. COMPARISON: None available. CLINICAL HISTORY: Head trauma, moderate-severe. Patient brought in by medic. Per medic patient is from [facility] with bruising on the  right side of her head right buttock from a fall a few days ago from drinking per patient and wheezing. Patient c/o back and neck pain reported. Albuterol  duo ; neb mag and zofran  administered by [employer]. Hx COPD FINDINGS: CT HEAD BRAIN AND VENTRICLES: No acute intracranial hemorrhage. No mass effect or midline shift. No extra-axial fluid collection. No evidence of acute infarct. No hydrocephalus. SKULL AND SCALP: No acute skull fracture. No scalp hematoma. CT FACIAL BONES FACIAL BONES: No acute facial  fracture. No mandibular dislocation. No suspicious bone lesion. ORBITS: No acute traumatic injury. SINUSES AND MASTOIDS: No acute abnormality. SOFT TISSUES: No acute abnormality. CT CERVICAL SPINE BONES AND ALIGNMENT: No acute fracture or traumatic malalignment. DEGENERATIVE CHANGES: Mild degenerative changes of the lower cervical spine, noting motion degradation. SOFT TISSUES: No prevertebral soft tissue swelling. IMPRESSION: 1. Motion degraded images. 2. No acute intracranial abnormality. 3. No traumatic injury of the cervical spine. 4. No acute fracture of the facial bones. Electronically signed by: Pinkie Pebbles MD 08/22/2024 08:47 PM EDT RP Workstation: [id number]   CT Cervical Spine Wo Contrast Result Date: 08/22/2024 EXAM: CT HEAD, FACIAL BONES AND CERVICAL SPINE WITHOUT CONTRAST 08/22/2024 08:39:30 PM TECHNIQUE: CT of the head, facial bones and cervical spine was performed without the administration of intravenous contrast. Multiplanar reformatted images are provided for review. Automated exposure control, iterative reconstruction, and/or weight based adjustment of the mA/kV was utilized to reduce the radiation dose to as low as reasonably achievable. COMPARISON: None available. CLINICAL HISTORY: Head trauma, moderate-severe. Patient brought in by medic. Per medic patient is from [facility] with bruising on the right side of her head right buttock from a fall a few days ago from drinking per patient and wheezing. Patient c/o back and neck pain reported. Albuterol  duo ; neb mag and zofran  administered by [employer]. Hx COPD FINDINGS: CT HEAD BRAIN AND VENTRICLES: No acute intracranial hemorrhage. No mass effect or midline shift. No extra-axial fluid collection. No evidence of acute infarct. No hydrocephalus. SKULL AND SCALP: No acute skull fracture. No scalp hematoma. CT FACIAL BONES FACIAL BONES: No acute facial fracture. No mandibular dislocation. No suspicious bone lesion. ORBITS: No acute traumatic  injury. SINUSES AND MASTOIDS: No acute abnormality. SOFT TISSUES: No acute abnormality. CT CERVICAL SPINE BONES AND ALIGNMENT: No acute fracture or traumatic malalignment. DEGENERATIVE CHANGES: Mild degenerative changes of the lower cervical spine, noting motion degradation. SOFT TISSUES: No prevertebral soft tissue swelling. IMPRESSION: 1. Motion degraded images. 2. No acute intracranial abnormality. 3. No traumatic injury of the cervical spine. 4. No acute fracture of the facial bones. Electronically signed by: Pinkie Pebbles MD 08/22/2024 08:47 PM EDT RP Workstation: [id number]   CT Maxillofacial Wo Contrast Result Date: 08/22/2024 EXAM: CT HEAD, FACIAL BONES AND CERVICAL SPINE WITHOUT CONTRAST 08/22/2024 08:39:30 PM TECHNIQUE: CT of the head, facial bones and cervical spine was performed without the administration of intravenous contrast. Multiplanar reformatted images are provided for review. Automated exposure control, iterative reconstruction, and/or weight based adjustment of the mA/kV was utilized to reduce the radiation dose to as low as reasonably achievable. COMPARISON: None available. CLINICAL HISTORY: Head trauma, moderate-severe. Patient brought in by medic. Per medic patient is from [facility] with bruising on the right side of her head right buttock from a fall a few days ago from drinking per patient and wheezing. Patient c/o back and neck pain reported. Albuterol  duo ; neb mag and zofran  administered by [employer]. Hx COPD FINDINGS: CT HEAD BRAIN AND VENTRICLES: No acute  intracranial hemorrhage. No mass effect or midline shift. No extra-axial fluid collection. No evidence of acute infarct. No hydrocephalus. SKULL AND SCALP: No acute skull fracture. No scalp hematoma. CT FACIAL BONES FACIAL BONES: No acute facial fracture. No mandibular dislocation. No suspicious bone lesion. ORBITS: No acute traumatic injury. SINUSES AND MASTOIDS: No acute abnormality. SOFT TISSUES: No acute abnormality. CT  CERVICAL SPINE BONES AND ALIGNMENT: No acute fracture or traumatic malalignment. DEGENERATIVE CHANGES: Mild degenerative changes of the lower cervical spine, noting motion degradation. SOFT TISSUES: No prevertebral soft tissue swelling. IMPRESSION: 1. Motion degraded images. 2. No acute intracranial abnormality. 3. No traumatic injury of the cervical spine. 4. No acute fracture of the facial bones. Electronically signed by: Pinkie Pebbles MD 08/22/2024 08:47 PM EDT RP Workstation: [id number]    DISCHARGE EXAMINATION: Vitals:   08/27/24 9055 08/27/24 0946 08/27/24 0947 08/27/24 0948  BP:      Pulse:      Resp:      Temp:      TempSrc:      SpO2: 96% 96% 96% 96%  Weight:      Height:       general appearance: Awake alert.  In no distress Resp: Clear to auscultation bilaterally.  Normal effort Cardio: S1-S2 is normal regular.  No S3-S4.  No rubs murmurs or bruit GI: Abdomen is soft.  Nontender nondistended.  Bowel sounds are present normal.  No masses organomegaly Extremities: No edema.  Full range of motion of lower extremities. Neurologic: Alert and oriented x3.  No focal neurological deficits.    DISPOSITION: Home  Discharge Instructions     Call MD for:  difficulty breathing, headache or visual disturbances   Complete by: As directed    Call MD for:  extreme fatigue   Complete by: As directed    Call MD for:  persistant dizziness or light-headedness   Complete by: As directed    Call MD for:  persistant nausea and vomiting   Complete by: As directed    Call MD for:  severe uncontrolled pain   Complete by: As directed    Call MD for:  temperature >100.4   Complete by: As directed    Diet general   Complete by: As directed    Discharge instructions   Complete by: As directed    Please stop alcohol use.  Please be sure to follow-up with your primary care provider in 1 to 2 weeks.  Take your medications as prescribed.    You were cared for by a hospitalist during your  hospital stay. If you have any questions about your discharge medications or the care you received while you were in the hospital after you are discharged, you can call the unit and asked to speak with the hospitalist on call if the hospitalist that took care of you is not available. Once you are discharged, your primary care physician will handle any further medical issues. Please note that NO REFILLS for any discharge medications will be authorized once you are discharged, as it is imperative that you return to your primary care physician (or establish a relationship with a primary care physician if you do not have one) for your aftercare needs so that they can reassess your need for medications and monitor your lab values. If you do not have a primary care physician, you can call [phone] for a physician referral.   Increase activity slowly   Complete by: As directed  Allergies as of 08/27/2024       Reactions   Hydrocodone  Other (See Comments)   Dizzy; Tolerates oxycodone  fine        Medication List     STOP taking these medications    acamprosate  333 MG tablet Commonly known as: CAMPRAL    cloNIDine  0.1 MG tablet Commonly known as: CATAPRES    gabapentin  100 MG capsule Commonly known as: NEURONTIN    losartan 50 MG tablet Commonly known as: COZAAR       TAKE these medications    albuterol  1.25 MG/3ML nebulizer solution Commonly known as: ACCUNEB  Take 1 ampule by nebulization 3 (three) times daily as needed for wheezing or shortness of breath.   albuterol  108 (90 Base) MCG/ACT inhaler Commonly known as: VENTOLIN  HFA Inhale 1-2 puffs into the lungs every 6 (six) hours as needed for wheezing.   budesonide  0.5 MG/2ML nebulizer solution Commonly known as: PULMICORT  Inhale 0.5 mg into the lungs daily.   buPROPion  150 MG 24 hr tablet Commonly known as: Wellbutrin  XL Take 1 tablet (150 mg total) by mouth daily.   busPIRone  10 MG tablet Commonly known as:  BUSPAR  Take 10 mg by mouth 2 (two) times daily.   chlordiazePOXIDE  25 MG capsule Commonly known as: LIBRIUM  Take 1 capsule twice a day for 3 days followed by 1 capsule once a day for 3 days and then stop   Dupixent 300 MG/2ML Soaj Generic drug: Dupilumab Inject 300 mg into the skin every 14 (fourteen) days.   FLUoxetine  20 MG capsule Commonly known as: PROZAC  Take 20 mg by mouth every morning.   fluticasone  50 MCG/ACT nasal spray Commonly known as: FLONASE  Place 2 sprays into both nostrils daily.   folic acid  1 MG tablet Commonly known as: FOLVITE  Take 1 mg by mouth daily.   hydrOXYzine  25 MG tablet Commonly known as: ATARAX  Take 1 tablet (25 mg total) by mouth every 6 (six) hours as needed (For anxiety or agitation).   lamoTRIgine  100 MG tablet Commonly known as: LAMICTAL  Take 50 mg by mouth 2 (two) times daily.   methocarbamol  500 MG tablet Commonly known as: ROBAXIN  Take 2 tablets (1,000 mg total) by mouth every 8 (eight) hours as needed for muscle spasms.   nicotine  14 mg/24hr patch Commonly known as: NICODERM CQ  - dosed in mg/24 hours Place 1 patch (14 mg total) onto the skin daily as needed (For smoking cessation).   progesterone  100 MG capsule Commonly known as: PROMETRIUM  Take 100-200 mg by mouth at bedtime.   SUMAtriptan  100 MG tablet Commonly known as: IMITREX  Take 1 tablet (100 mg total) by mouth every 2 (two) hours as needed for migraine. May repeat in 2 hours if headache persists or recurs. Do not exceed 2 tablets within a 24 hour period.   thiamine  100 MG tablet Commonly known as: Vitamin B-1 Take 1 tablet (100 mg total) by mouth daily.   Trelegy Ellipta 100-62.5-25 MCG/ACT Aepb Generic drug: Fluticasone -Umeclidin-Vilant Inhale 1 puff into the lungs daily.   triamcinolone  cream 0.5 % Commonly known as: KENALOG  Apply topically 2 (two) times daily.          Follow-up Information     Valora Agent, MD. Schedule an appointment as soon as  possible for a visit in 1 week(s).   Specialty: Family Medicine Why: post hospitalization follow up Contact information: [address] [facility] [place] [address] [phone]  TOTAL DISCHARGE TIME: 35 minutes  Lillyn Wieczorek [employer] on [email or web address]  08/29/2024, 10:33 AM

## 2024-08-27 NOTE — Progress Notes (Signed)
 Discharge medications delivered to patient at the bedside in a secure bag, discharge instructions given to patient questions asked and answered.

## 2024-08-30 ENCOUNTER — Emergency Department
Admission: EM | Admit: 2024-08-30 | Discharge: 2024-08-30 | Disposition: A | Discharge status: Home / Self Care | Attending: Emergency Medicine | Admitting: Emergency Medicine

## 2024-08-30 ENCOUNTER — Other Ambulatory Visit: Payer: Self-pay

## 2024-08-30 ENCOUNTER — Emergency Department

## 2024-08-30 DIAGNOSIS — R0602 Shortness of breath: Secondary | ICD-10-CM

## 2024-08-30 DIAGNOSIS — R059 Cough, unspecified: Secondary | ICD-10-CM

## 2024-08-30 DIAGNOSIS — I10 Essential (primary) hypertension: Secondary | ICD-10-CM | POA: Insufficient documentation

## 2024-08-30 DIAGNOSIS — J441 Chronic obstructive pulmonary disease with (acute) exacerbation: Secondary | ICD-10-CM | POA: Diagnosis not present

## 2024-08-30 LAB — CBC
HCT: 45.2 % (ref 36.0–46.0)
Hemoglobin: 14.5 g/dL (ref 12.0–15.0)
MCH: 31.7 pg (ref 26.0–34.0)
MCHC: 32.1 g/dL (ref 30.0–36.0)
MCV: 98.9 fL (ref 80.0–100.0)
Platelets: 158 K/uL (ref 150–400)
RBC: 4.57 MIL/uL (ref 3.87–5.11)
RDW: 13.3 % (ref 11.5–15.5)
WBC: 9.3 K/uL (ref 4.0–10.5)
nRBC: 0 % (ref 0.0–0.2)

## 2024-08-30 LAB — BASIC METABOLIC PANEL WITH GFR
Anion gap: 9 (ref 5–15)
BUN: 12 mg/dL (ref 6–20)
CO2: 23 mmol/L (ref 22–32)
Calcium: 7.3 mg/dL — ABNORMAL LOW (ref 8.9–10.3)
Chloride: 111 mmol/L (ref 98–111)
Creatinine, Ser: 0.44 mg/dL (ref 0.44–1.00)
GFR, Estimated: >60 mL/min (ref >60)
Glucose, Bld: 90 mg/dL (ref 70–99)
Potassium: 4.1 mmol/L (ref 3.5–5.1)
Sodium: 143 mmol/L (ref 135–145)

## 2024-08-30 LAB — RESP PANEL BY RT-PCR (RSV, FLU A&B, COVID)  RVPGX2
Influenza A by PCR: NEGATIVE
Influenza B by PCR: NEGATIVE
Resp Syncytial Virus by PCR: NEGATIVE
SARS Coronavirus 2 by RT PCR: NEGATIVE

## 2024-08-30 LAB — CBG MONITORING, ED: Glucose-Capillary: 116 mg/dL — ABNORMAL HIGH (ref 70–99)

## 2024-08-30 MED ORDER — DOXYCYCLINE HYCLATE 100 MG PO TABS
100.0000 mg | ORAL_TABLET | Freq: Two times a day (BID) | ORAL | 0 refills | Status: DC
Start: 2024-08-30 — End: 2024-09-07

## 2024-08-30 MED ORDER — PREDNISONE 20 MG PO TABS
40.0000 mg | ORAL_TABLET | Freq: Once | ORAL | Status: AC
  Administered 2024-08-30: 40 mg via ORAL
  Filled 2024-08-30: qty 2

## 2024-08-30 MED ORDER — ALBUTEROL SULFATE HFA 108 (90 BASE) MCG/ACT IN AERS: 1.0000 | INHALATION_SPRAY | Freq: Four times a day (QID) | RESPIRATORY_TRACT | 0 refills | Status: AC | PRN

## 2024-08-30 MED ORDER — DOXYCYCLINE HYCLATE 100 MG PO TABS
100.0000 mg | ORAL_TABLET | Freq: Once | ORAL | Status: AC
  Administered 2024-08-30: 100 mg via ORAL
  Filled 2024-08-30: qty 1

## 2024-08-30 MED ORDER — PREDNISONE 20 MG PO TABS: 40.0000 mg | ORAL_TABLET | Freq: Every day | ORAL | 0 refills | Status: AC

## 2024-08-30 NOTE — ED Triage Notes (Signed)
 BIB EMS for shortness of breath, dizziness, and nausea x 1 week. Hx of COPD.

## 2024-08-30 NOTE — Discharge Instructions (Signed)
 Please take the steroids and antibiotics as prescribed for your COPD exacerbation.  Please make sure to follow-up with your primary care doctor as well as your lung doctor for reassessment next week.

## 2024-08-30 NOTE — ED Provider Notes (Signed)
 [name] Provider Note    Event Date/Time   First MD Initiated Contact with Patient 08/30/24 [phone]     (approximate)   History   Shortness of Breath, Dizziness, and Nausea   HPI  [name] is a 58 y.o. female with history of COPD, hypertension, shortness of breath.  Not on any home oxygen.  Presenting with shortness of breath as well as cough, states that her cough was productive earlier, now is dry, no chest pain or headache, no fever, no leg swelling.  States that she did drink some alcohol today.  States that she was brought here by EMS, they gave her multiple rounds of DuoNebs but did not give her any Solu-Medrol .  States that she is asymptomatic at this time.   On independent chart review, she was admitted in late September for alcohol withdrawal, was discharged on Librium  taper.     Physical Exam   Triage Vital Signs: ED Triage Vitals  Encounter Vitals Group     BP 08/30/24 0130 (!) 128/92     Girls Systolic BP Percentile --      Girls Diastolic BP Percentile --      Boys Systolic BP Percentile --      Boys Diastolic BP Percentile --      Pulse Rate 08/30/24 0130 (!) 109     Resp 08/30/24 0130 20     Temp 08/30/24 0130 98.1 F (36.7 C)     Temp Source 08/30/24 0130 Oral     SpO2 08/30/24 0130 91 %     Weight --      Height --      Head Circumference --      Peak Flow --      Pain Score 08/30/24 0131 8     Pain Loc --      Pain Education --      Exclude from Growth Chart --     Most recent vital signs: Vitals:   08/30/24 0150 08/30/24 0538  BP:  127/76  Pulse:  (!) 101  Resp:  18  Temp:  98.4 F (36.9 C)  SpO2: 95% 100%     General: Awake, no distress.  CV:  Good peripheral perfusion.  Resp:  Normal effort.  Satting 98% on room air, sparse prolonged expiratory wheeze bilaterally but no tachypnea, increased work of breathing or retractions Abd:  No distention.  Soft nontender Other:  No unilateral calf swelling or  tenderness, no lower extremity edema.  Mildly dry oral mucosa, no tongue fasciculations   ED Results / Procedures / Treatments   Labs (all labs ordered are listed, but only abnormal results are displayed) Labs Reviewed  BASIC METABOLIC PANEL WITH GFR - Abnormal; Notable for the following components:      Result Value   Calcium  7.3 (*)    All other components within normal limits  CBG MONITORING, ED - Abnormal; Notable for the following components:   Glucose-Capillary 116 (*)    All other components within normal limits  RESP PANEL BY RT-PCR (RSV, FLU A&B, COVID)  RVPGX2  CBC     EKG  EKG shows, sinus tachycardia, rate 112, normal QS, normal QTc, no obvious ischemic ST elevation, T wave flattening in aVL, T wave changes new compared to prior   RADIOLOGY On my independent interpretation, chest x-ray without obvious consolidation   PROCEDURES:  Critical Care performed: No  Procedures   MEDICATIONS ORDERED IN ED: Medications  doxycycline (  VIBRA-TABS) tablet 100 mg (100 mg Oral Given 08/30/24 0546)  predniSONE  (DELTASONE ) tablet 40 mg (40 mg Oral Given 08/30/24 0547)     IMPRESSION / MDM / ASSESSMENT AND PLAN / ED COURSE  I reviewed the triage vital signs and the nursing notes.                              Differential diagnosis includes, but is not limited to, COPD exacerbation, viral illness, pneumonia, electrolyte derangements.  Did consider ACS but she does not have any chest pain, states that she was wheezing wheezing diffusely with resolution of symptoms after DuoNebs, suspect this is related to COPD rather than cardiac cause.  Also no evidence of volume overload to suggest CHF.  Labs, chest x-ray were obtained out of triage.  Also viral panel.  Patient's presentation is most consistent with acute presentation with potential threat to life or bodily function.  Independent interpretation of labs and imaging below.  Given that she did not get any steroids or  doxycycline, will give her that here.  Will discharge her with outpatient prescriptions for steroids and doxycycline for COPD exacerbation.  Did offer patient some fluids here since she says that she has not eaten all day, but she declines, states that she would like to go home.  Will give her a refill of her albuterol  as well.  Otherwise considered but no indication for inpatient admission at this time, she is safe for outpatient management.  Will discharge with strict return precautions.  Shared decision making done with patient and she is agreeable with this plan.  The patient is on the cardiac monitor to evaluate for evidence of arrhythmia and/or significant heart rate changes.       FINAL CLINICAL IMPRESSION(S) / ED DIAGNOSES   Final diagnoses:  COPD exacerbation (HCC)  Shortness of breath  Cough, unspecified type     Rx / DC Orders   ED Discharge Orders          Ordered    predniSONE  (DELTASONE ) 20 MG tablet  Daily        08/30/24 0543    doxycycline (VIBRA-TABS) 100 MG tablet  2 times daily        08/30/24 0543    albuterol  (VENTOLIN  HFA) 108 (90 Base) MCG/ACT inhaler  Every 6 hours PRN        08/30/24 0543             Note:  This document was prepared using Dragon voice recognition software and may include unintentional dictation errors.    Waymond Lorelle Cummins, MD 08/30/24 [phone]

## 2024-09-05 ENCOUNTER — Inpatient Hospital Stay
Admission: EM | Admit: 2024-09-05 | Discharge: 2024-09-07 | DRG: 189 | Disposition: A | Discharge status: Home / Self Care

## 2024-09-05 ENCOUNTER — Other Ambulatory Visit: Payer: Self-pay

## 2024-09-05 ENCOUNTER — Emergency Department

## 2024-09-05 ENCOUNTER — Inpatient Hospital Stay

## 2024-09-05 DIAGNOSIS — F10229 Alcohol dependence with intoxication, unspecified: Secondary | ICD-10-CM | POA: Diagnosis present

## 2024-09-05 DIAGNOSIS — G928 Other toxic encephalopathy: Secondary | ICD-10-CM | POA: Diagnosis present

## 2024-09-05 DIAGNOSIS — J441 Chronic obstructive pulmonary disease with (acute) exacerbation: Secondary | ICD-10-CM | POA: Diagnosis present

## 2024-09-05 DIAGNOSIS — Y908 Blood alcohol level of 240 mg/100 ml or more: Secondary | ICD-10-CM | POA: Diagnosis present

## 2024-09-05 DIAGNOSIS — Z79890 Hormone replacement therapy (postmenopausal): Secondary | ICD-10-CM | POA: Diagnosis not present

## 2024-09-05 DIAGNOSIS — Z1152 Encounter for screening for COVID-19: Secondary | ICD-10-CM | POA: Diagnosis not present

## 2024-09-05 DIAGNOSIS — F32A Depression, unspecified: Secondary | ICD-10-CM | POA: Diagnosis present

## 2024-09-05 DIAGNOSIS — Z885 Allergy status to narcotic agent status: Secondary | ICD-10-CM | POA: Diagnosis not present

## 2024-09-05 DIAGNOSIS — J9809 Other diseases of bronchus, not elsewhere classified: Secondary | ICD-10-CM | POA: Diagnosis present

## 2024-09-05 DIAGNOSIS — F909 Attention-deficit hyperactivity disorder, unspecified type: Secondary | ICD-10-CM | POA: Diagnosis present

## 2024-09-05 DIAGNOSIS — I77810 Thoracic aortic ectasia: Secondary | ICD-10-CM | POA: Diagnosis present

## 2024-09-05 DIAGNOSIS — F17290 Nicotine dependence, other tobacco product, uncomplicated: Secondary | ICD-10-CM | POA: Diagnosis present

## 2024-09-05 DIAGNOSIS — G9341 Metabolic encephalopathy: Secondary | ICD-10-CM | POA: Diagnosis present

## 2024-09-05 DIAGNOSIS — F419 Anxiety disorder, unspecified: Secondary | ICD-10-CM | POA: Diagnosis present

## 2024-09-05 DIAGNOSIS — T510X1A Toxic effect of ethanol, accidental (unintentional), initial encounter: Secondary | ICD-10-CM | POA: Diagnosis present

## 2024-09-05 DIAGNOSIS — L930 Discoid lupus erythematosus: Secondary | ICD-10-CM | POA: Diagnosis present

## 2024-09-05 DIAGNOSIS — F1029 Alcohol dependence with unspecified alcohol-induced disorder: Secondary | ICD-10-CM

## 2024-09-05 DIAGNOSIS — J4551 Severe persistent asthma with (acute) exacerbation: Secondary | ICD-10-CM | POA: Diagnosis present

## 2024-09-05 DIAGNOSIS — F17210 Nicotine dependence, cigarettes, uncomplicated: Secondary | ICD-10-CM | POA: Diagnosis present

## 2024-09-05 DIAGNOSIS — Z79899 Other long term (current) drug therapy: Secondary | ICD-10-CM | POA: Diagnosis not present

## 2024-09-05 DIAGNOSIS — Z7951 Long term (current) use of inhaled steroids: Secondary | ICD-10-CM | POA: Diagnosis not present

## 2024-09-05 DIAGNOSIS — Z733 Stress, not elsewhere classified: Secondary | ICD-10-CM | POA: Diagnosis not present

## 2024-09-05 DIAGNOSIS — J9601 Acute respiratory failure with hypoxia: Secondary | ICD-10-CM | POA: Diagnosis present

## 2024-09-05 DIAGNOSIS — F10929 Alcohol use, unspecified with intoxication, unspecified: Secondary | ICD-10-CM

## 2024-09-05 DIAGNOSIS — Z559 Problems related to education and literacy, unspecified: Secondary | ICD-10-CM

## 2024-09-05 DIAGNOSIS — F10220 Alcohol dependence with intoxication, uncomplicated: Secondary | ICD-10-CM | POA: Diagnosis present

## 2024-09-05 DIAGNOSIS — R251 Tremor, unspecified: Secondary | ICD-10-CM | POA: Diagnosis present

## 2024-09-05 LAB — BASIC METABOLIC PANEL WITH GFR
Anion gap: 14 (ref 5–15)
BUN: 15 mg/dL (ref 6–20)
CO2: 25 mmol/L (ref 22–32)
Calcium: 7.6 mg/dL — ABNORMAL LOW (ref 8.9–10.3)
Chloride: 102 mmol/L (ref 98–111)
Creatinine, Ser: 0.62 mg/dL (ref 0.44–1.00)
GFR, Estimated: >60 mL/min (ref >60)
Glucose, Bld: 119 mg/dL — ABNORMAL HIGH (ref 70–99)
Potassium: 3.7 mmol/L (ref 3.5–5.1)
Sodium: 141 mmol/L (ref 135–145)

## 2024-09-05 LAB — CBC
HCT: 41.2 % (ref 36.0–46.0)
HCT: 42.3 % (ref 36.0–46.0)
Hemoglobin: 13.5 g/dL (ref 12.0–15.0)
Hemoglobin: 13.7 g/dL (ref 12.0–15.0)
MCH: 31.7 pg (ref 26.0–34.0)
MCH: 31.6 pg (ref 26.0–34.0)
MCHC: 32.8 g/dL (ref 30.0–36.0)
MCHC: 32.4 g/dL (ref 30.0–36.0)
MCV: 96.7 fL (ref 80.0–100.0)
MCV: 97.7 fL (ref 80.0–100.0)
Platelets: 160 K/uL (ref 150–400)
Platelets: 160 K/uL (ref 150–400)
RBC: 4.26 MIL/uL (ref 3.87–5.11)
RBC: 4.33 MIL/uL (ref 3.87–5.11)
RDW: 13.3 % (ref 11.5–15.5)
RDW: 13.2 % (ref 11.5–15.5)
WBC: 6.7 K/uL (ref 4.0–10.5)
WBC: 6 K/uL (ref 4.0–10.5)
nRBC: 0 % (ref 0.0–0.2)
nRBC: 0 % (ref 0.0–0.2)

## 2024-09-05 LAB — CBC WITH DIFFERENTIAL/PLATELET
Abs Immature Granulocytes: 0.09 K/uL — ABNORMAL HIGH (ref 0.00–0.07)
Basophils Absolute: 0.1 K/uL (ref 0.0–0.1)
Basophils Relative: 1 %
Eosinophils Absolute: 0.2 K/uL (ref 0.0–0.5)
Eosinophils Relative: 2 %
HCT: 46.1 % — ABNORMAL HIGH (ref 36.0–46.0)
Hemoglobin: 14.8 g/dL (ref 12.0–15.0)
Immature Granulocytes: 1 %
Lymphocytes Relative: 54 %
Lymphs Abs: 5.6 K/uL — ABNORMAL HIGH (ref 0.7–4.0)
MCH: 31.5 pg (ref 26.0–34.0)
MCHC: 32.1 g/dL (ref 30.0–36.0)
MCV: 98.1 fL (ref 80.0–100.0)
Monocytes Absolute: 0.7 K/uL (ref 0.1–1.0)
Monocytes Relative: 7 %
Neutro Abs: 3.6 K/uL (ref 1.7–7.7)
Neutrophils Relative %: 35 %
Platelets: 203 K/uL (ref 150–400)
RBC: 4.7 MIL/uL (ref 3.87–5.11)
RDW: 13.2 % (ref 11.5–15.5)
WBC: 10.2 K/uL (ref 4.0–10.5)
nRBC: 0.2 % (ref 0.0–0.2)

## 2024-09-05 LAB — COMPREHENSIVE METABOLIC PANEL WITH GFR
ALT: 32 U/L (ref 0–44)
AST: 38 U/L (ref 15–41)
Albumin: 3.8 g/dL (ref 3.5–5.0)
Alkaline Phosphatase: 118 U/L (ref 38–126)
Anion gap: 14 (ref 5–15)
BUN: 14 mg/dL (ref 6–20)
CO2: 27 mmol/L (ref 22–32)
Calcium: 8.2 mg/dL — ABNORMAL LOW (ref 8.9–10.3)
Chloride: 101 mmol/L (ref 98–111)
Creatinine, Ser: 0.73 mg/dL (ref 0.44–1.00)
GFR, Estimated: >60 mL/min (ref >60)
Glucose, Bld: 95 mg/dL (ref 70–99)
Potassium: 4.1 mmol/L (ref 3.5–5.1)
Sodium: 142 mmol/L (ref 135–145)
Total Bilirubin: 0.3 mg/dL (ref 0.0–1.2)
Total Protein: 6.5 g/dL (ref 6.5–8.1)

## 2024-09-05 LAB — RESP PANEL BY RT-PCR (RSV, FLU A&B, COVID)  RVPGX2
Influenza A by PCR: NEGATIVE
Influenza B by PCR: NEGATIVE
Resp Syncytial Virus by PCR: NEGATIVE
SARS Coronavirus 2 by RT PCR: NEGATIVE

## 2024-09-05 LAB — MAGNESIUM: Magnesium: 2.3 mg/dL (ref 1.7–2.4)

## 2024-09-05 LAB — BRAIN NATRIURETIC PEPTIDE: B Natriuretic Peptide: 8.4 pg/mL (ref 0.0–100.0)

## 2024-09-05 LAB — BLOOD GAS, VENOUS
Acid-Base Excess: 4.3 mmol/L — ABNORMAL HIGH (ref 0.0–2.0)
Bicarbonate: 31.8 mmol/L — ABNORMAL HIGH (ref 20.0–28.0)
O2 Saturation: 63.3 %
Patient temperature: 37
pCO2, Ven: 59 mmHg (ref 44–60)
pH, Ven: 7.34 (ref 7.25–7.43)
pO2, Ven: 40 mmHg (ref 32–45)

## 2024-09-05 LAB — URINE DRUG SCREEN, QUALITATIVE (ARMC ONLY)
Amphetamines, Ur Screen: NOT DETECTED
Barbiturates, Ur Screen: NOT DETECTED
Benzodiazepine, Ur Scrn: POSITIVE — AB
Cannabinoid 50 Ng, Ur ~~LOC~~: NOT DETECTED
Cocaine Metabolite,Ur ~~LOC~~: NOT DETECTED
MDMA (Ecstasy)Ur Screen: NOT DETECTED
Methadone Scn, Ur: NOT DETECTED
Opiate, Ur Screen: NOT DETECTED
Phencyclidine (PCP) Ur S: NOT DETECTED
Tricyclic, Ur Screen: NOT DETECTED

## 2024-09-05 LAB — TROPONIN I (HIGH SENSITIVITY): Troponin I (High Sensitivity): 4 ng/L (ref <18)

## 2024-09-05 LAB — PHOSPHORUS: Phosphorus: 4.6 mg/dL (ref 2.5–4.6)

## 2024-09-05 LAB — ETHANOL: Alcohol, Ethyl (B): 303 mg/dL (ref <15)

## 2024-09-05 LAB — PROCALCITONIN: Procalcitonin: <0.1 ng/mL

## 2024-09-05 MED ORDER — NICOTINE 21 MG/24HR TD PT24
21.0000 mg | MEDICATED_PATCH | Freq: Every day | TRANSDERMAL | Status: DC
  Administered 2024-09-05 – 2024-09-07 (×3): 21 mg via TRANSDERMAL
  Filled 2024-09-05 (×3): qty 1

## 2024-09-05 MED ORDER — IOHEXOL 350 MG/ML SOLN
75.0000 mL | Freq: Once | INTRAVENOUS | Status: AC | PRN
  Administered 2024-09-05: 75 mL via INTRAVENOUS

## 2024-09-05 MED ORDER — FLUOXETINE HCL 20 MG PO CAPS
20.0000 mg | ORAL_CAPSULE | Freq: Every morning | ORAL | Status: DC
  Administered 2024-09-05 – 2024-09-07 (×3): 20 mg via ORAL
  Filled 2024-09-05 (×3): qty 1

## 2024-09-05 MED ORDER — THIAMINE HCL 100 MG/ML IJ SOLN
100.0000 mg | Freq: Once | INTRAMUSCULAR | Status: AC
  Administered 2024-09-05: 100 mg via INTRAVENOUS
  Filled 2024-09-05: qty 2

## 2024-09-05 MED ORDER — BUPROPION HCL ER (XL) 150 MG PO TB24
150.0000 mg | ORAL_TABLET | Freq: Every day | ORAL | Status: DC
  Administered 2024-09-05 – 2024-09-07 (×3): 150 mg via ORAL
  Filled 2024-09-05 (×3): qty 1

## 2024-09-05 MED ORDER — BUSPIRONE HCL 10 MG PO TABS
10.0000 mg | ORAL_TABLET | Freq: Two times a day (BID) | ORAL | Status: DC
  Administered 2024-09-05 – 2024-09-07 (×6): 10 mg via ORAL
  Filled 2024-09-05: qty 2
  Filled 2024-09-05 (×3): qty 1
  Filled 2024-09-05: qty 2
  Filled 2024-09-05: qty 1

## 2024-09-05 MED ORDER — IPRATROPIUM-ALBUTEROL 0.5-2.5 (3) MG/3ML IN SOLN: 3.0000 mL | RESPIRATORY_TRACT | Status: DC | PRN

## 2024-09-05 MED ORDER — LORAZEPAM 2 MG/ML IJ SOLN
1.0000 mg | INTRAMUSCULAR | Status: DC | PRN
  Administered 2024-09-05: 1 mg via INTRAVENOUS
  Administered 2024-09-05 (×2): 2 mg via INTRAVENOUS
  Filled 2024-09-05 (×3): qty 1

## 2024-09-05 MED ORDER — ONDANSETRON HCL 4 MG/2ML IJ SOLN
4.0000 mg | INTRAMUSCULAR | Status: AC
  Administered 2024-09-05: 4 mg via INTRAVENOUS
  Filled 2024-09-05: qty 2

## 2024-09-05 MED ORDER — IPRATROPIUM-ALBUTEROL 0.5-2.5 (3) MG/3ML IN SOLN
3.0000 mL | Freq: Once | RESPIRATORY_TRACT | Status: AC
  Administered 2024-09-05: 3 mL via RESPIRATORY_TRACT
  Filled 2024-09-05: qty 3

## 2024-09-05 MED ORDER — METHYLPREDNISOLONE SODIUM SUCC 125 MG IJ SOLR
125.0000 mg | Freq: Once | INTRAMUSCULAR | Status: AC
  Administered 2024-09-05: 125 mg via INTRAVENOUS
  Filled 2024-09-05: qty 2

## 2024-09-05 MED ORDER — ACETAMINOPHEN 325 MG PO TABS
650.0000 mg | ORAL_TABLET | Freq: Four times a day (QID) | ORAL | Status: DC | PRN
  Administered 2024-09-05 – 2024-09-06 (×3): 650 mg via ORAL
  Filled 2024-09-05 (×3): qty 2

## 2024-09-05 MED ORDER — BUDESON-GLYCOPYRROL-FORMOTEROL 160-9-4.8 MCG/ACT IN AERO
2.0000 | INHALATION_SPRAY | Freq: Two times a day (BID) | RESPIRATORY_TRACT | Status: DC
  Administered 2024-09-05 – 2024-09-07 (×5): 2 via RESPIRATORY_TRACT
  Filled 2024-09-05 (×3): qty 5.9

## 2024-09-05 MED ORDER — LORAZEPAM 2 MG PO TABS
0.0000 mg | ORAL_TABLET | ORAL | Status: AC
  Administered 2024-09-05 – 2024-09-07 (×10): 2 mg via ORAL
  Filled 2024-09-05 (×11): qty 1

## 2024-09-05 MED ORDER — ADULT MULTIVITAMIN W/MINERALS CH
1.0000 | ORAL_TABLET | Freq: Every day | ORAL | Status: DC
  Administered 2024-09-05 – 2024-09-07 (×3): 1 via ORAL
  Filled 2024-09-05 (×3): qty 1

## 2024-09-05 MED ORDER — LORAZEPAM 2 MG PO TABS
0.0000 mg | ORAL_TABLET | Freq: Three times a day (TID) | ORAL | Status: DC
  Administered 2024-09-07: 2 mg via ORAL
  Filled 2024-09-05: qty 1

## 2024-09-05 MED ORDER — NICOTINE POLACRILEX 2 MG MT GUM
2.0000 mg | CHEWING_GUM | OROMUCOSAL | Status: DC | PRN
  Administered 2024-09-06 (×2): 2 mg via ORAL
  Filled 2024-09-05 (×2): qty 1

## 2024-09-05 MED ORDER — LORAZEPAM 1 MG PO TABS
1.0000 mg | ORAL_TABLET | ORAL | Status: DC | PRN
  Administered 2024-09-06 – 2024-09-07 (×5): 2 mg via ORAL
  Filled 2024-09-05 (×5): qty 2

## 2024-09-05 MED ORDER — DOXYCYCLINE HYCLATE 100 MG PO TABS: 100.0000 mg | ORAL_TABLET | Freq: Two times a day (BID) | ORAL | Status: DC

## 2024-09-05 MED ORDER — FOLIC ACID 1 MG PO TABS
1.0000 mg | ORAL_TABLET | Freq: Every day | ORAL | Status: DC
  Administered 2024-09-05 – 2024-09-07 (×3): 1 mg via ORAL
  Filled 2024-09-05 (×3): qty 1

## 2024-09-05 MED ORDER — ENOXAPARIN SODIUM 40 MG/0.4ML IJ SOSY
40.0000 mg | PREFILLED_SYRINGE | INTRAMUSCULAR | Status: DC
  Administered 2024-09-05 – 2024-09-06 (×2): 40 mg via SUBCUTANEOUS
  Filled 2024-09-05 (×2): qty 0.4

## 2024-09-05 MED ORDER — THIAMINE MONONITRATE 100 MG PO TABS
100.0000 mg | ORAL_TABLET | Freq: Every day | ORAL | Status: DC
  Administered 2024-09-06 – 2024-09-07 (×2): 100 mg via ORAL
  Filled 2024-09-05 (×3): qty 1

## 2024-09-05 MED ORDER — PREDNISONE 20 MG PO TABS
40.0000 mg | ORAL_TABLET | Freq: Every day | ORAL | Status: DC
Start: 2024-09-05 — End: 2024-09-10
  Administered 2024-09-05 – 2024-09-07 (×3): 40 mg via ORAL
  Filled 2024-09-05 (×3): qty 2

## 2024-09-05 MED ORDER — LEVALBUTEROL HCL 0.63 MG/3ML IN NEBU
0.6300 mg | INHALATION_SOLUTION | Freq: Three times a day (TID) | RESPIRATORY_TRACT | Status: DC
  Administered 2024-09-05 – 2024-09-07 (×5): 0.63 mg via RESPIRATORY_TRACT
  Filled 2024-09-05 (×7): qty 3

## 2024-09-05 NOTE — ED Notes (Addendum)
 Notified MD of critical lab 303 mg/dL alcohol level

## 2024-09-05 NOTE — Consult Note (Signed)
 PULMONOLOGY         Date: 09/05/2024,   MRN# [id number] [name] May 19, 1966     Admission                  Current   Referring provider: Dr Jerrilyn   CHIEF COMPLAINT:   Acute hypoxemic respiratory failure   HISTORY OF PRESENT ILLNESS    [name] is a 58 y.o. female with medical history significant of Asth and COPD overlpa syndrome she has been on biologic therapy with dupulimab and her insurance stopped covering it so she ended up with worsening dyspena and cough since May 2025.  She admits to starting to smoke again and this made things much worse and ended up with severe respiratory symptoms and cough. She was brought in by EMS due to hypoxemia.  She denies having sick contacts. She previously had alcoholism and recently relapsed.  She endorses stress due to school.  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   Sleep apnea    mild   SUI (stress urinary incontinence, female)      SURGICAL HISTORY   Past Surgical History:  Procedure Laterality Date   BLADDER SUSPENSION N/A 02/13/2013   Procedure: [facility] SLING;  Surgeon: Norleen JINNY Seltzer, MD;  Location: [facility];  Service: Urology;  Laterality: N/A;   CESAREAN SECTION  12-23-2004    W/ LEFT TUBAL LIGATION AND RIGHT SALPINGECTOMY   CESAREAN SECTION  1995   CYSTOSCOPY WITH STENT PLACEMENT Bilateral 06/16/2020    Procedure: CYSTOSCOPY WITH FIREFLY INJECTION;  Surgeon: Carolee Sherwood JONETTA DOUGLAS, MD;  Location: [facility];  Service: Urology;  Laterality: Bilateral;   IR RADIOLOGIST EVAL & MGMT  04/21/2020   IR RADIOLOGIST EVAL & MGMT  05/12/2020   ORIF HUMERUS FRACTURE Right 04/28/2014   Procedure: OPEN REDUCTION INTERNAL FIXATION (ORIF) PROXIMAL HUMERUS FRACTURE;  Surgeon: Eva Elsie Herring, MD;  Location: [facility];  Service: Orthopedics;  Laterality: Right;   ORIF PROXIMAL HUMERUS FRACTURE Right 04/28/2014   DR CHANDLER   PROCTOSCOPY N/A 06/16/2020   Procedure: RIGID PROCTOSCOPY;  Surgeon: Sheldon Standing, MD;  Location: [facility];  Service: General;  Laterality: N/A;   TUBAL LIGATION       FAMILY HISTORY   Family History  Adopted: Yes     SOCIAL HISTORY   Social History   Tobacco Use   Smoking status: Every Day    Current packs/day: 1.00    Average packs/day: 0.4 packs/day for 23.8 years (8.8 ttl pk-yrs)    Types: Cigarettes    Start date: 09/24/1999    Last attempt to quit: 09/24/2019   Smokeless tobacco: Never  Vaping Use   Vaping status: Every Day   Substances: Nicotine   Substance Use Topics   Alcohol use: Yes    Alcohol/week: 2.0 standard drinks of  alcohol    Types: 2 Glasses of wine per week    Comment: 12/29/20 restarted    Drug use: No     MEDICATIONS     Current Medication:  Current Facility-Administered Medications:    acetaminophen  (TYLENOL ) tablet 650 mg, 650 mg, Oral, Q6H PRN, Dorinda Drue DASEN, MD, 650 mg at 09/05/24 9361   budesonide -glycopyrrolate -formoterol  (BREZTRI ) 160-9-4.8 MCG/ACT inhaler 2 puff, 2 puff, Inhalation, BID, Dorinda Drue DASEN, MD, 2 puff at 09/05/24 9074   buPROPion  (WELLBUTRIN  XL) 24 hr tablet 150 mg, 150 mg, Oral, Daily, Djan, Prince T, MD, 150 mg at 09/05/24 0900   busPIRone  (BUSPAR ) tablet 10 mg, 10 mg, Oral, BID, Djan, Prince T, MD, 10 mg at 09/05/24 0900   enoxaparin  (LOVENOX ) injection 40 mg, 40 mg, Subcutaneous, Q24H, Djan, Prince T, MD, 40 mg at 09/05/24  0900   FLUoxetine  (PROZAC ) capsule 20 mg, 20 mg, Oral, q morning, Djan, Prince T, MD, 20 mg at 09/05/24 0900   folic acid  (FOLVITE ) tablet 1 mg, 1 mg, Oral, Daily, Djan, Prince T, MD, 1 mg at 09/05/24 0900   ipratropium-albuterol  (DUONEB) 0.5-2.5 (3) MG/3ML nebulizer solution 3 mL, 3 mL, Nebulization, Q4H PRN, Djan, Drue DASEN, MD   LORazepam  (ATIVAN ) tablet 1-4 mg, 1-4 mg, Oral, Q1H PRN **OR** LORazepam  (ATIVAN ) injection 1-4 mg, 1-4 mg, Intravenous, Q1H PRN, Dorinda Drue DASEN, MD, 2 mg at 09/05/24 1301   LORazepam  (ATIVAN ) tablet 0-4 mg, 0-4 mg, Oral, Q4H, 2 mg at 09/05/24 1052 **FOLLOWED BY** [START ON 09/07/2024] LORazepam  (ATIVAN ) tablet 0-4 mg, 0-4 mg, Oral, Q8H, Ponnala, Shruthi, MD   multivitamin with minerals tablet 1 tablet, 1 tablet, Oral, Daily, Djan, Drue DASEN, MD, 1 tablet at 09/05/24 0900   nicotine  (NICODERM CQ  - dosed in mg/24 hours) patch 21 mg, 21 mg, Transdermal, Daily, Ponnala, Shruthi, MD, 21 mg at 09/05/24 1122   nicotine  polacrilex (NICORETTE ) gum 2 mg, 2 mg, Oral, PRN, Ponnala, Shruthi, MD   predniSONE  (DELTASONE ) tablet 40 mg, 40 mg, Oral, Q breakfast, Djan, Prince T, MD, 40 mg at 09/05/24 9144   thiamine  (VITAMIN B1) tablet 100 mg, 100 mg, Oral, Daily, Djan, Drue DASEN, MD    ALLERGIES   Hydrocodone      REVIEW OF SYSTEMS    Review of Systems:  Gen:  Denies  fever, sweats, chills weigh loss  HEENT: Denies blurred vision, double vision, ear pain, eye pain, hearing loss, nose bleeds, sore throat Cardiac:  No dizziness, chest pain or heaviness, chest tightness,edema Resp:   reports dyspnea chronically  Gi: Denies swallowing difficulty, stomach pain, nausea or vomiting, diarrhea, constipation, bowel incontinence Gu:  Denies bladder incontinence, burning urine Ext:   Denies Joint pain, stiffness or swelling Skin: Denies  skin rash, easy bruising or bleeding or hives Endoc:  Denies polyuria, polydipsia , polyphagia or weight change Psych:   Denies depression, insomnia or  hallucinations   Other:  All other systems negative   VS: BP 113/74 (BP Location: Right Arm)   Pulse (!) 109   Temp 98.3 F (36.8 C) (Oral)   Resp 17   SpO2 98%      PHYSICAL EXAM    GENERAL:NAD, no fevers, chills, no weakness no fatigue HEAD: Normocephalic, atraumatic.  EYES: Pupils equal, round, reactive to light. Extraocular muscles intact. No scleral icterus.  MOUTH: Moist mucosal membrane. Dentition intact. No abscess noted.  EAR, NOSE, THROAT: Clear without exudates. No external lesions.  NECK: Supple. No thyromegaly. No nodules. No JVD.  PULMONARY: decreased breath  sounds with mild rhonchi worse at bases bilaterally.  CARDIOVASCULAR: S1 and S2. Regular rate and rhythm. No murmurs, rubs, or gallops. No edema. Pedal pulses 2+ bilaterally.  GASTROINTESTINAL: Soft, nontender, nondistended. No masses. Positive bowel sounds. No hepatosplenomegaly.  MUSCULOSKELETAL: No swelling, clubbing, or edema. Range of motion full in all extremities.  NEUROLOGIC: Cranial nerves II through XII are intact. No gross focal neurological deficits. Sensation intact. Reflexes intact.  SKIN: No ulceration, lesions, rashes, or cyanosis. Skin warm and dry. Turgor intact.  PSYCHIATRIC: Mood, affect within normal limits. The patient is awake, alert and oriented x 3. Insight, judgment intact.       IMAGING     ASSESSMENT/PLAN   Acute hypoxemic respiratory failure    - Patient with Severe persistent asthma COPD overlap (ACOS)    -She is very sensitive to bronchodilators with Sinus tachycardia, I have ordered xopenex .  At home she also uses ensifentrine  BID via nebulizer and budesonide  via nebulizer     -CRP     -PCR for COVID/FLU/RSV is negative     - RVP is in process    - She received IV solumedrol 125mg  and is now on prednisone     - recommendation for PT/OT and DC planning   Tracheobronchomalacia     - can work on this on outpatient as this may require surgical intervention via thoracic  surgery   Alcoholism and Tobacco dependence     - nicotine  replacement      - she requests gum     -on CIWA         Thank you for allowing me to participate in the care of this patient.   Patient/Family are satisfied with care plan and all questions have been answered.    Provider disclosure: Patient with at least one acute or chronic illness or injury that poses a threat to life or bodily function and is being managed actively during this encounter.  All of the below services have been performed independently by signing provider:  review of prior documentation from internal and or external health records.  Review of previous and current lab results.  Interview and comprehensive assessment during patient visit today. Review of current and previous chest radiographs/CT scans. Discussion of management and test interpretation with health care team and patient/family.   This document was prepared using Dragon voice recognition software and may include unintentional dictation errors.     Suleima Ohlendorf, M.D.  Division of Pulmonary & Critical Care Medicine

## 2024-09-05 NOTE — ED Provider Notes (Signed)
 [facility] Provider Note    Event Date/Time   First MD Initiated Contact with Patient 09/05/24 0022     (approximate)   History   Respiratory Distress  Level 5 caveat:  history/ROS limited by acute/critical illness  HPI [name] is a 58 y.o. female with history of alcohol abuse previously requiring admission, COPD, and asthma, but with no chronic supplemental oxygen requirement.  She presents by EMS for severe shortness of breath.  Reportedly she has been having increased work of breathing for about a week.  EMS was called and when the first responders found her she was Rasnic, minimally responsive, increased work of breathing, and had a room air oxygen saturation of 75%.  When paramedics arrived they gave her 2 breathing treatments and said that she was lethargic so they were not comfortable starting her on CPAP.  The breathing treatment seem to open her up but she started coughing more and looks better by the time she arrived to the ED.  We are maintaining her on supplemental oxygen now and she is in moderate respiratory distress.  She confirmed the COPD and asthma history.  She also refers to a fall that she had about a week ago and for which she was evaluated because she hit her head, but she states that she started to drool afterwards.  She is also complaining of low back pain that has been persistent since her fall.  She feels nauseated and started to vomit, but that may have been because of the albuterol  and the coughing/sputum.     Physical Exam   Triage Vital Signs: ED Triage Vitals  Encounter Vitals Group     BP 09/05/24 0025 (!) 141/79     Girls Systolic BP Percentile --      Girls Diastolic BP Percentile --      Boys Systolic BP Percentile --      Boys Diastolic BP Percentile --      Pulse Rate 09/05/24 0025 (!) 114     Resp 09/05/24 0025 19     Temp 09/05/24 0025 97.9 F (36.6 C)     Temp Source 09/05/24 0025 Oral     SpO2 09/05/24  0025 94 %     Weight --      Height --      Head Circumference --      Peak Flow --      Pain Score 09/05/24 0027 3     Pain Loc --      Pain Education --      Exclude from Growth Chart --     Most recent vital signs: Vitals:   09/05/24 0025  BP: (!) 141/79  Pulse: (!) 114  Resp: 19  Temp: 97.9 F (36.6 C)  SpO2: 94%    General: Awake,  Moderate respiratory distress and reporting pain everywhere.  Slurred speech and strong smell of alcohol. CV:  Good peripheral perfusion.  Tachycardia, regular rhythm. Resp:  Increased respiratory effort, accessory muscle usage and intercostal retractions.  Coarse breath sounds, thick productive cough, expiratory wheezing. Abd:  No distention.  No localizable tenderness to palpation but the patient reports pain everywhere.   ED Results / Procedures / Treatments   Labs (all labs ordered are listed, but only abnormal results are displayed) Labs Reviewed  COMPREHENSIVE METABOLIC PANEL WITH GFR - Abnormal; Notable for the following components:      Result Value   Calcium  8.2 (*)    All  other components within normal limits  CBC WITH DIFFERENTIAL/PLATELET - Abnormal; Notable for the following components:   HCT 46.1 (*)    Lymphs Abs 5.6 (*)    Abs Immature Granulocytes 0.09 (*)    All other components within normal limits  BLOOD GAS, VENOUS - Abnormal; Notable for the following components:   Bicarbonate 31.8 (*)    Acid-Base Excess 4.3 (*)    All other components within normal limits  ETHANOL - Abnormal; Notable for the following components:   Alcohol, Ethyl (B) 303 (*)    All other components within normal limits  RESP PANEL BY RT-PCR (RSV, FLU A&B, COVID)  RVPGX2  BRAIN NATRIURETIC PEPTIDE  TROPONIN I (HIGH SENSITIVITY)     EKG  ED ECG REPORT I, Darleene Dome, the attending physician, personally viewed and interpreted this ECG.  Date: 09/05/2024 EKG Time: 00: 22 Rate: 116 Rhythm: Sinus tachycardia QRS Axis:  normal Intervals: normal ST/T Wave abnormalities: Non-specific ST segment / T-wave changes, but no clear evidence of acute ischemia. Narrative Interpretation: no definitive evidence of acute ischemia; does not meet STEMI criteria.    RADIOLOGY I independently viewed and interpreted the patient's chest x-ray and I see no evidence of pneumonia or pneumothorax.  I also read the radiologist's report, which confirmed no acute findings.  I also independently viewed and interpreted the patient's lumbar spine x-rays and I see no evidence of fracture.  I also read the radiologist's report, which confirmed no acute findings.   PROCEDURES:  Critical Care performed: Yes, see critical care procedure note(s)  .Critical Care  Performed by: Dome Darleene, MD Authorized by: Dome Darleene, MD   Critical care provider statement:    Critical care time (minutes):  30   Critical care time was exclusive of:  Separately billable procedures and treating other patients   Critical care was necessary to treat or prevent imminent or life-threatening deterioration of the following conditions:  Respiratory failure   Critical care was time spent personally by me on the following activities:  Development of treatment plan with patient or surrogate, evaluation of patient's response to treatment, examination of patient, obtaining history from patient or surrogate, ordering and performing treatments and interventions, ordering and review of laboratory studies, ordering and review of radiographic studies, pulse oximetry, re-evaluation of patient's condition and review of old charts .1-3 Lead EKG Interpretation  Performed by: Dome Darleene, MD Authorized by: Dome Darleene, MD     Interpretation: abnormal     ECG rate:  115   ECG rate assessment: tachycardic     Rhythm: sinus tachycardia     Ectopy: none     Conduction: normal       IMPRESSION / MDM / ASSESSMENT AND PLAN / ED COURSE  I reviewed the triage vital  signs and the nursing notes.                              Differential diagnosis includes, but is not limited to, COPD exacerbation, asthma exacerbation, pneumonia, aspiration, medication or intoxicant side effect.  Patient's presentation is most consistent with acute presentation with potential threat to life or bodily function.  Labs/studies ordered: BNP, ethanol level, high-sensitivity troponin, VBG, CBC with differential, CMP, respiratory viral panel, chest x-ray, lumbar spine x-rays, EKG  Interventions/Medications given:  Medications  thiamine  (VITAMIN B1) injection 100 mg (has no administration in time range)  ondansetron  (ZOFRAN ) injection 4 mg (has no administration in time range)  ipratropium-albuterol  (DUONEB) 0.5-2.5 (3) MG/3ML nebulizer solution 3 mL (3 mLs Nebulization Given 09/05/24 0043)  methylPREDNISolone  sodium succinate (SOLU-MEDROL ) 125 mg/2 mL injection 125 mg (125 mg Intravenous Given 09/05/24 0043)    (Note:  hospital course my include additional interventions and/or labs/studies not listed above.)   Patient clearly having COPD exacerbation.  Pneumonia not visible on chest x-ray.  Aspiration certainly possible given the patient's level of intoxication (ethanol level greater than 300), but not visible on chest x-ray.  No leukocytosis, no indication for antibiotics at this time.  I checked a VBG to make sure she was not retaining CO2 and it was generally reassuring.  High-sensitivity troponin and BNP are negative.  Respiratory viral panel is negative.  CMP is reassuring.  Patient requires admission for acute respiratory failure with hypoxia in the setting of a COPD exacerbation.  She has received maximal DuoNeb therapy by receiving 2 DuoNebs prehospital and I ordered another 1.  No indication for magnesium  as it is not generally indicated for COPD.  I gave her a dose of Solu-Medrol  125 mg IV and and providing supplemental oxygen.  She does not clearly need BiPAP and I think  it would be a bad choice given her level of intoxication given the risk of vomiting and aspiration  The patient is on the cardiac monitor to evaluate for evidence of arrhythmia and/or significant heart rate changes.   Clinical Course as of 09/05/24 9792  [place] Sep 05, 2024  0158 I am consulting the hospitalist team for admission.   [CF]  0203 I consulted by phone with the admitting hospitalist, and they will admit the patient - Dr. Dorinda. [CF]    Clinical Course User Index [CF] Gordan Huxley, MD     FINAL CLINICAL IMPRESSION(S) / ED DIAGNOSES   Final diagnoses:  Acute respiratory failure with hypoxia (HCC)  Alcoholic intoxication with complication  Alcohol dependence with unspecified alcohol-induced disorder (HCC)     Rx / DC Orders   ED Discharge Orders     None        Note:  This document was prepared using Dragon voice recognition software and may include unintentional dictation errors.   Gordan Huxley, MD 09/05/24 [phone]

## 2024-09-05 NOTE — Progress Notes (Signed)
 PROGRESS NOTE    [name]  FMW:[id number] DOB: [birth year] DOA: 09/05/2024 PCP: Stanton Lynwood FALCON, MD  Chief Complaint  Patient presents with   Respiratory Distress    Hospital Course:  [name] is a 58 y.o. female with medical history significant of asthma/COPD not on home oxygen, anxiety disorder, ADHD, depression was bibems due to SOB and being lethargic for few days now.  Has history of alcohol abuse, last drink day before presentation.  Patient admitted for further management for acute hypoxic respiratory failure likely due to asthma/COPD exacerbation, acute toxic encephalopathy likely due to alcohol use.  Hospital course as below  Subjective: Patient was examined at bedside, states she feels significantly better.  Is awake and alert. Continues to have tremors and tachycardia placed on Ativan  protocol Seen by pulmonary for tracheobronchomalacia   Objective: Vitals:   09/05/24 0800 09/05/24 0848 09/05/24 0900 09/05/24 0902  BP: 108/71 123/73 118/73   Pulse: (!) 108 (!) 114 (!) 108   Resp: 15  15   Temp:    98.2 F (36.8 C)  TempSrc:    Oral  SpO2: 98%  97%     Examination: General: Anxious, tremulous Cardiovascular: Tachycardic, No murmurs heard Pulmonary: Decreased air entry bilaterally with few expiratory wheezing Abdominal: soft, non tender Neurological: AO x3, no gross focal deficits  Assessment & Plan:   Acute hypoxic respiratory failure  secondary to Asthma/ COPD exacerbation - Patient with shortness of breath, desaturation and tachycardia, requiring 2L [place] - Flu/COVID/RSV negative - s/p Solu-Medrol  will switch to prednisone  to complete a total of 5 days course - Home inhalers, Albuterol  changed to Xopenex  due to tachycardia - states just completed course of doxycycline - CRP, RVP pending - Seen by Pulmonary, appreciate recs  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  -- PT/OT eval  DVT prophylaxis: Lovenox  SQ   Code Status: Full Code Disposition:  TBD  Consultants:  Pulmonary  Procedures:  None  Antimicrobials:  Anti-infectives (From admission, onward)    None       Data Reviewed: I have personally reviewed following labs and imaging studies CBC: Recent Labs  Lab 08/30/24 0140 09/05/24 0023 09/05/24 0216 09/05/24 0423  WBC 9.3 10.2 6.7 6.0  NEUTROABS  --  3.6  --   --   HGB 14.5 14.8 13.5 13.7  HCT 45.2 46.1* 41.2 42.3  MCV 98.9 98.1 96.7 97.7  PLT 158 203 160 160   Basic Metabolic Panel: Recent Labs  Lab 08/30/24 0140 09/05/24 0023 09/05/24 0423  NA 143 142 141  K 4.1 4.1 3.7  CL 111 101 102  CO2 23 27 25   GLUCOSE 90 95 119*  BUN 12 14 15   CREATININE 0.44 0.73 0.62  CALCIUM  7.3* 8.2* 7.6*   GFR: Estimated Creatinine Clearance: 69.8 mL/min (by C-G formula based on SCr of 0.62 mg/dL).  Liver Function Tests: Recent Labs  Lab 09/05/24 0023  AST 38  ALT 32  ALKPHOS 118  BILITOT 0.3  PROT 6.5  ALBUMIN 3.8   CBG: Recent Labs  Lab 08/30/24 0417  GLUCAP 116*    Recent Results (from the past 240 hours)  Resp panel by RT-PCR (RSV, Flu A&B, Covid) Anterior Nasal Swab     Status: None   Collection Time: 08/30/24  1:40 AM   Specimen: Anterior Nasal Swab  Result Value Ref Range Status   SARS Coronavirus 2  by RT PCR NEGATIVE NEGATIVE Final    Comment: (NOTE) SARS-CoV-2 target nucleic acids are NOT DETECTED.  The SARS-CoV-2 RNA is generally detectable in upper respiratory specimens during the acute phase of infection. The lowest concentration of SARS-CoV-2 viral copies this assay can detect is 138 copies/mL. A negative result does not preclude SARS-Cov-2 infection and should not be used as the sole basis for treatment or other patient management decisions. A negative result may occur with  improper specimen collection/handling, submission of specimen other than nasopharyngeal swab, presence of viral mutation(s) within the areas targeted by this assay, and inadequate number of viral copies(<138 copies/mL). A negative result must be combined with clinical observations, patient history, and epidemiological information. The expected result is Negative.  Fact Sheet for Patients:  BloggerCourse.com  Fact Sheet for Healthcare Providers:  SeriousBroker.it  This test is no t yet approved or cleared by the [place]  FDA and  has been authorized for detection and/or diagnosis of SARS-CoV-2 by FDA under an Emergency Use Authorization (EUA). This EUA will remain  in effect (meaning this test can be used) for the duration of the COVID-19 declaration under Section 564(b)(1) of the Act, 21 U.S.C.section 360bbb-3(b)(1), unless the authorization is terminated  or revoked sooner.       Influenza A by PCR NEGATIVE NEGATIVE Final   Influenza B by PCR NEGATIVE NEGATIVE Final    Comment: (NOTE) The Xpert Xpress SARS-CoV-2/FLU/RSV plus assay is intended as an aid in the diagnosis of influenza from Nasopharyngeal swab specimens and should not be used as a sole basis for treatment. Nasal washings and aspirates are unacceptable for Xpert Xpress SARS-CoV-2/FLU/RSV testing.  Fact Sheet for Patients: BloggerCourse.com  Fact  Sheet for Healthcare Providers: SeriousBroker.it  This test is not yet approved or cleared by the [place]  FDA and has been authorized for detection and/or diagnosis of SARS-CoV-2 by FDA under an Emergency Use Authorization (EUA). This EUA will remain in effect (meaning this test can be used) for the duration of the COVID-19 declaration under Section 564(b)(1) of the Act, 21 U.S.C. section 360bbb-3(b)(1), unless the authorization is terminated or revoked.     Resp Syncytial Virus by PCR NEGATIVE NEGATIVE Final    Comment: (NOTE) Fact Sheet for Patients: BloggerCourse.com  Fact Sheet for Healthcare Providers: SeriousBroker.it  This test is not yet approved or cleared by the [place]  FDA and has been authorized for detection and/or diagnosis of SARS-CoV-2 by FDA under an Emergency Use Authorization (EUA). This EUA will remain in effect (meaning this test can be used) for the duration of the COVID-19 declaration under Section 564(b)(1) of the Act, 21 U.S.C. section 360bbb-3(b)(1), unless the authorization is terminated or revoked.  Performed at [facility], [address] [place], [place] [address]   Resp panel by RT-PCR (RSV, Flu A&B, Covid) Anterior Nasal Swab     Status: None   Collection Time: 09/05/24 12:24 AM   Specimen: Anterior Nasal Swab  Result Value Ref Range Status   SARS Coronavirus 2 by RT PCR NEGATIVE NEGATIVE Final    Comment: (NOTE) SARS-CoV-2 target nucleic acids are NOT DETECTED.  The SARS-CoV-2 RNA is generally detectable in upper respiratory specimens during the acute phase of infection. The lowest concentration of SARS-CoV-2 viral copies this assay can detect is 138 copies/mL. A negative result does not preclude SARS-Cov-2 infection and should not be used as the sole basis for treatment or other patient management decisions. A negative result may occur with   improper specimen collection/handling, submission of specimen other than nasopharyngeal swab, presence of viral mutation(s) within the areas targeted by this assay, and inadequate number of viral copies(<138 copies/mL). A negative result must be combined with clinical observations, patient history, and epidemiological information. The expected result is Negative.  Fact Sheet for Patients:  BloggerCourse.com  Fact Sheet for Healthcare Providers:  SeriousBroker.it  This test is no t yet approved or cleared by the [place]  FDA and  has been authorized for detection and/or diagnosis of SARS-CoV-2 by FDA under an Emergency Use Authorization (EUA). This EUA will remain  in effect (meaning this test can be used) for the duration of the COVID-19 declaration under Section 564(b)(1) of the Act, 21 U.S.C.section 360bbb-3(b)(1), unless the authorization is terminated  or revoked sooner.       Influenza A by PCR NEGATIVE NEGATIVE Final   Influenza B by PCR NEGATIVE NEGATIVE Final    Comment: (NOTE) The Xpert Xpress SARS-CoV-2/FLU/RSV plus assay is intended as an aid in the diagnosis of influenza from Nasopharyngeal swab specimens and should not be used as a sole basis for treatment. Nasal washings and aspirates are unacceptable for Xpert Xpress SARS-CoV-2/FLU/RSV testing.  Fact Sheet for Patients: BloggerCourse.com  Fact Sheet for Healthcare Providers: SeriousBroker.it  This test is not yet approved or cleared by the [place]  FDA and has been authorized for detection and/or diagnosis of SARS-CoV-2 by FDA under an Emergency Use Authorization (EUA). This EUA will remain in effect (meaning this test can be used) for the duration of the COVID-19 declaration under Section 564(b)(1) of the Act, 21 U.S.C. section 360bbb-3(b)(1), unless the authorization is terminated or revoked.      Resp Syncytial Virus by PCR NEGATIVE NEGATIVE Final    Comment: (NOTE) Fact Sheet for Patients: BloggerCourse.com  Fact Sheet for Healthcare Providers: SeriousBroker.it  This test is not yet approved or cleared by the [place]  FDA and has been authorized for detection and/or diagnosis of SARS-CoV-2 by FDA under an Emergency Use Authorization (EUA). This EUA will remain in effect (meaning this test can be used) for the duration of the COVID-19 declaration under Section 564(b)(1) of the Act, 21 U.S.C. section 360bbb-3(b)(1), unless the authorization is terminated or revoked.  Performed at [facility], [address] [place], [place] [address]      Radiology Studies: CT Angio Chest Pulmonary Embolism (PE) W or WO Contrast Result Date: 09/05/2024 EXAM: CTA CHEST 09/05/2024 03:52:05 AM TECHNIQUE: CTA of the chest was performed after the administration of intravenous contrast. Without and with IV contrast. 75mL iohexol  (OMNIPAQUE ) 350 MG/ML injection. Multiplanar reformatted images are provided for review. MIP images are provided for review. Automated exposure control, iterative reconstruction, and/or weight based adjustment of the mA/kV was utilized to reduce the radiation dose to as low as reasonably achievable. COMPARISON: CT angiogram of the chest dated 11/26/2023. CLINICAL HISTORY: Pulmonary embolism (PE) suspected, low to intermediate prob, neg D-dimer. Pt arrived via EMS from home  for breathing difficulty she states has been going on for for about a week. Hx of Asthma and COPD. EMS gave duoneb x2. Reports she fell a week ago. EMS reports she was [place] and lethargic when they arrived. FINDINGS: PULMONARY ARTERIES: Pulmonary arteries are adequately opacified for evaluation. No acute pulmonary embolus. Main pulmonary artery is normal in caliber. MEDIASTINUM: The heart and pericardium demonstrate no acute abnormality. There is  fusiform ectasia of the ascending thoracic aorta, which measures approximately 3.9 cm in diameter. LYMPH NODES: No mediastinal, hilar or axillary lymphadenopathy. LUNGS AND PLEURA: There are streaky peribronchovascular opacities present within the base of the right lower lobe. There is minimal atelectasis also present dependently within the left lower lobe. There is mild central lobular emphysema present within the lung apices. There is marked narrowing of the AP diameter of the tracheal lumen which narrows to as little as 3 mm. The mainstem bronchi are also mildly narrowed. There is mild bronchial wall thickening within the right lower lobe. No focal consolidation or pulmonary edema. No evidence of pleural effusion or pneumothorax. UPPER ABDOMEN: Limited images of the upper abdomen are unremarkable. SOFT TISSUES AND BONES: The patient is status post orthopedic fixation of the right humerus. No acute bone or soft tissue abnormality. IMPRESSION: 1. No evidence of pulmonary embolus. 2. Marked tracheal narrowing to approximately 3 mm AP diameter with mild mainstem bronchial narrowing, compatible with tracheobronchial malalacia. 3. Streaky peribronchovascular opacities in the right lower lobe and minimal dependent atelectasis in the left lower lobe. 4. Mild centrilobular emphysema in the lung apices. 5. 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 Electronically signed by: Evalene Coho MD 09/05/2024 04:17 AM EDT RP Workstation: [id number]   DG Chest Port 1 View Result Date: 09/05/2024 CLINICAL DATA:  Difficulty breathing EXAM: PORTABLE CHEST 1 VIEW COMPARISON:  08/30/2024 FINDINGS: Hardware in the right humerus. No acute airspace disease or effusion. Stable cardiomediastinal silhouette. No pneumothorax. Chronic right-sided rib fractures. IMPRESSION: No active disease. Electronically Signed   By: Luke Bun M.D.   On: 09/05/2024 01:26   DG Lumbar Spine 2-3 Views Result  Date: 09/05/2024 CLINICAL DATA:  Pain after fall EXAM: LUMBAR SPINE - 2-3 VIEW COMPARISON:  CT 12/22/2021 FINDINGS: Mild dextrocurvature. Vertebral body heights are grossly maintained. The disc spaces are relatively patent. Lower lumbar facet degenerative changes. IMPRESSION: No definite acute osseous abnormality Electronically Signed   By: Luke Bun M.D.   On: 09/05/2024 01:25    Scheduled Meds:  budesonide -glycopyrrolate -formoterol   2 puff Inhalation BID   buPROPion   150 mg Oral Daily   busPIRone   10 mg Oral BID   enoxaparin  (LOVENOX ) injection  40 mg Subcutaneous Q24H   FLUoxetine   20 mg Oral q morning   folic acid   1 mg Oral Daily   multivitamin with minerals  1 tablet Oral Daily   predniSONE   40 mg Oral Q breakfast   thiamine   100 mg Oral Daily   Continuous Infusions:   LOS: 0 days  MDM: Patient is high risk for one or more organ failure.  They necessitate ongoing hospitalization for continued IV therapies and subsequent lab monitoring. Total time spent interpreting labs and vitals, reviewing the medical record, coordinating care amongst consultants and care team members, directly assessing and discussing care with the patient and/or family: 55 min Laree Lock, MD [facility]  To contact the attending physician between 7A-7P please use Epic Chat. To contact the covering physician during after hours 7P-7A, please review Amion.  09/05/2024, 9:39 AM   *This document has been created with the assistance of dictation software. Please excuse typographical errors. *

## 2024-09-05 NOTE — TOC Initial Note (Signed)
 Transition of Care [facility]) - Initial/Assessment Note    Patient Details  Name: [name] MRN: [id number] Date of Birth: 1966-07-11  [facility] CM/SW Contact:    [place] L Taye Cato, LCSW Phone Number: 09/05/2024, 8:15 AM  Clinical Narrative:                    [facility] consult received for substance abuse counseling/education. TOC does not provide counseling. Substance Abuse resources have been added to the AVS.      Patient Goals and CMS Choice            Expected Discharge Plan and Services                                              Prior Living Arrangements/Services                       Activities of Daily Living      Permission Sought/Granted                  Emotional Assessment              Admission diagnosis:  Acute metabolic encephalopathy [G93.41] Patient Active Problem List   Diagnosis Date Noted   Acute metabolic encephalopathy 09/05/2024   Alcohol abuse 01/10/2024   Asthma attacks lasting more than 24 hours 11/26/2023   Major depressive disorder, 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/22/2023   Alcohol withdrawal (HCC) 04/10/2023   Asthma exacerbation 04/09/2023   Alcohol use disorder 12/26/2022   Asthma 12/26/2022   MDD (major depressive disorder), recurrent episode, severe (HCC) 12/25/2022   Depression, unspecified 01/06/2021   Alcohol use 01/06/2021   Insomnia 06/18/2020   SUI (stress urinary incontinence, female)    Hypertension    History of anemia    GERD (gastroesophageal reflux disease)    History of alcoholism (HCC) 02/25/2020   Intractable migraine with aura without status migrainosus 02/25/2020   Attention deficit hyperactivity disorder (ADHD), predominantly inattentive type 01/07/2020   GAD (generalized anxiety disorder) 01/07/2020   MDD (major depressive disorder), recurrent severe, without psychosis (HCC) 01/07/2020   Diverticulitis with abscess/fistula s/p robotic left  colectomy 06/16/2020 09/06/2019   MDD (major depressive disorder) 10/07/2015   Alcohol abuse with alcohol-induced mood disorder (HCC) 10/06/2015   PCP:  Stanton Lynwood FALCON, MD Pharmacy:   [facility] [phone] - [place], [place] - [address] [place] [address] Phone: [phone] Fax: [phone]     Social Drivers of Health (SDOH) Social History: SDOH Screenings   Food Insecurity: No Food Insecurity (08/23/2024)  Housing: Low Risk  (08/23/2024)  Transportation Needs: No Transportation Needs (08/23/2024)  Utilities: Not At Risk (08/23/2024)  Alcohol Screen: Medium Risk (08/22/2023)  Depression (PHQ2-9): High Risk (01/12/2024)  Financial Resource Strain: Low Risk  (04/22/2024)   Received from [facility]  Tobacco Use: High Risk (08/30/2024)   SDOH Interventions:     Readmission Risk Interventions     No data to display

## 2024-09-05 NOTE — ED Notes (Signed)
 Called CCMD for monitoring

## 2024-09-05 NOTE — H&P (Signed)
 History and Physical    Patient: [name] FMW:[id number] DOB: 1966-07-07 DOA: 09/05/2024 DOS: the patient was seen and examined on 09/05/2024 PCP: Stanton Lynwood FALCON, MD  Patient coming from: Home  Chief Complaint: Shortness of breath Chief Complaint  Patient presents with   Respiratory Distress   HPI: [name] is a 58 y.o. female with medical history significant of asthma/COPD not on home oxygen, anxiety disorder, ADHD, depression who was brought in by EMS after neighbor called 911 because patient has been having worsening shortness of breath and difficulty breathing and has been lethargic for some days now.  Patient also abuses alcohol regularly last use was a day before presentation.  At that time patient was being seen she was only able to contribute minimally to history on account of being altered and extremely lethargic.  History was obtained from previous chart review as well as discussion with ED physician and EMS report. According to EMS report upon their arrival patient's saturation was 75% on room air and patient was lethargic and altered and was Ostermann in appearance.  Patient was having respiratory distress and was given DuoNebs treatment with some improvement in respiratory function and brought to the emergency room for further management.  ED course: Upon arrival to the emergency room temperature 97.9, respiratory rate 19, pulse 116, blood pressure 141/79 saturating 94% on 2 L of oxygen Chest x-ray did not show any acute findings of pneumonia Patient received Solu-Medrol  as well as nebulization in the emergency room.  Given concerns for COPD exacerbation [facility] was therefore contacted to admit patient for further management.  Review of Systems: unable to review all systems due to the inability of the patient to answer questions. 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   History of anemia    Severe episode of recurrent major depressive disorder, without psychotic features (HCC) 01/07/2020   Sleep apnea    mild   SUI (stress urinary incontinence, female)    Past Surgical History:  Procedure Laterality Date   BLADDER SUSPENSION N/A 02/13/2013   Procedure: [facility] SLING;  Surgeon: Norleen JINNY Seltzer, MD;  Location: [facility];  Service: Urology;  Laterality: N/A;   CESAREAN SECTION  12-23-2004    W/ LEFT TUBAL LIGATION AND RIGHT SALPINGECTOMY   CESAREAN SECTION  1995   CYSTOSCOPY WITH STENT PLACEMENT Bilateral 06/16/2020   Procedure: CYSTOSCOPY WITH FIREFLY INJECTION;  Surgeon: Carolee Sherwood JONETTA DOUGLAS, MD;  Location: [facility];  Service: Urology;  Laterality: Bilateral;   IR RADIOLOGIST EVAL & MGMT  04/21/2020   IR RADIOLOGIST EVAL & MGMT  05/12/2020   ORIF HUMERUS FRACTURE Right 04/28/2014   Procedure: OPEN REDUCTION INTERNAL FIXATION (ORIF) PROXIMAL HUMERUS FRACTURE;  Surgeon: Eva Elsie Herring, MD;  Location: [facility];  Service: Orthopedics;  Laterality: Right;   ORIF PROXIMAL HUMERUS FRACTURE Right 04/28/2014   DR CHANDLER   PROCTOSCOPY N/A 06/16/2020   Procedure: RIGID PROCTOSCOPY;  Surgeon: Sheldon Standing, MD;  Location: [facility];  Service: General;  Laterality: N/A;   TUBAL LIGATION     Social History:  reports that she has been smoking cigarettes. She started smoking about 24 years ago. She has a 8.8 pack-year smoking history. She has never used smokeless tobacco. She reports current alcohol use of about 2.0 standard drinks of alcohol per week. She reports that she does not use drugs.  Allergies  Allergen Reactions   Hydrocodone  Other (See Comments)    Dizzy; Tolerates oxycodone  fine    Family  History  Adopted: Yes    Prior to Admission medications   Medication Sig Start Date End Date Taking? Authorizing Provider  albuterol  (ACCUNEB ) 1.25 MG/3ML nebulizer solution Take 1 ampule by nebulization 3 (three) times daily as needed for wheezing or shortness of breath.    [provider]  albuterol  (VENTOLIN  HFA) 108 (90 Base) MCG/ACT inhaler Inhale 1-2 puffs into the lungs every 6 (six) hours as needed for wheezing. 08/30/24   Waymond Lorelle Cummins, MD  budesonide  (PULMICORT ) 0.5 MG/2ML nebulizer solution Inhale 0.5 mg into the lungs daily. 12/25/23 12/24/24  [provider]  buPROPion  (WELLBUTRIN  XL) 150 MG 24 hr tablet Take 1 tablet (150 mg total) by mouth daily. 05/14/24   Mozingo, Regina Nattalie, NP  busPIRone  (BUSPAR ) 10 MG tablet Take 10 mg by mouth 2 (two) times daily. 04/07/24   [provider]  chlordiazePOXIDE  (LIBRIUM ) 25 MG capsule Take 1 capsule twice a day for 3 days followed by 1 capsule once a day for 3 days and then stop 08/27/24   Krishnan, Gokul, MD  doxycycline (VIBRA-TABS) 100 MG tablet Take 1 tablet (100 mg total) by mouth 2 (two) times daily for 7 days. 08/30/24 09/06/24  Waymond Lorelle Cummins, MD  DUPIXENT 300 MG/2ML SOAJ Inject 300 mg into the skin every 14 (fourteen) days.    [provider]  FLUoxetine  (PROZAC ) 20 MG capsule Take 20 mg by mouth every morning. 07/25/24   [provider]  fluticasone  (FLONASE ) 50 MCG/ACT nasal spray Place 2 sprays into both nostrils daily. 08/11/24   [provider]  folic acid  (FOLVITE ) 1 MG tablet Take 1 mg by mouth daily.    [provider]  hydrOXYzine  (ATARAX ) 25 MG tablet Take 1 tablet (25 mg total) by mouth every 6 (six) hours as needed (For anxiety or agitation). 01/12/24   Lynnette Barter, MD  lamoTRIgine  (LAMICTAL ) 100 MG tablet Take 50 mg by mouth 2 (two) times daily. 11/23/23   [provider]  methocarbamol  (ROBAXIN ) 500 MG tablet Take 2 tablets (1,000 mg total) by mouth every 8 (eight)  hours as needed for muscle spasms. 08/27/24   Krishnan, Gokul, MD  nicotine  (NICODERM CQ  - DOSED IN MG/24 HOURS) 14 mg/24hr patch Place 1 patch (14 mg total) onto the skin daily as needed (For smoking cessation). 01/12/24   Lynnette Barter, MD  progesterone  (PROMETRIUM ) 100 MG capsule Take 100-200 mg by mouth at bedtime. 10/19/23   [provider]  SUMAtriptan  (IMITREX ) 100 MG tablet Take 1 tablet (100 mg total) by mouth every 2 (two) hours as needed for migraine. May repeat in 2 hours if headache persists or recurs. Do not exceed 2 tablets within a 24 hour period. 04/12/23   Briana Elgin LABOR, MD  thiamine  (VITAMIN B-1) 100 MG tablet Take 1 tablet (100 mg total) by mouth daily. 11/29/23   Dorinda Drue DASEN, MD  TRELEGY ELLIPTA 100-62.5-25 MCG/ACT AEPB Inhale 1 puff into the lungs daily. 12/28/23   [provider]  triamcinolone  cream (KENALOG ) 0.5 % Apply topically 2 (two) times daily. 08/25/23  Victoria Ruts, MD    Physical Exam: Vitals:   09/05/24 0025  BP: (!) 141/79  Pulse: (!) 114  Resp: 19  Temp: 97.9 F (36.6 C)  TempSrc: Oral  SpO2: 94%   Cardiovascular:     Rate and Rhythm: Normal rate and regular rhythm.     Pulses: Normal pulses.     Heart sounds: Normal heart sounds.  Pulmonary: Decreased air entry bilaterally with few expiratory wheezing noted Abdominal:     General: Abdomen is flat. Bowel sounds are normal. There is no distension.   Musculoskeletal:        General: No deformity. Normal range of motion.  Skin:    General: Skin is warm and dry.   Neurological: Patient lethargic, confused but moving all extremities bilaterally, able to answer few questions  Data Reviewed: Chest x-ray reviewed by me did not show any infiltrate CT angio of the chest requested that is pending I have reviewed below mentioned labs    Latest Ref Rng & Units 09/05/2024   12:23 AM 08/30/2024    1:40 AM 08/27/2024    4:14 AM  CBC  WBC 4.0 - 10.5 K/uL 10.2  9.3  5.7   Hemoglobin 12.0  - 15.0 g/dL 85.1  85.4  86.5   Hematocrit 36.0 - 46.0 % 46.1  45.2  41.7   Platelets 150 - 400 K/uL 203  158  130        Latest Ref Rng & Units 09/05/2024   12:23 AM 08/30/2024    1:40 AM 08/26/2024   12:48 PM  BMP  Glucose 70 - 99 mg/dL 95  90  85   BUN 6 - 20 mg/dL 14  12  17    Creatinine 0.44 - 1.00 mg/dL 9.26  9.55  9.27   Sodium 135 - 145 mmol/L 142  143  139   Potassium 3.5 - 5.1 mmol/L 4.1  4.1  4.2   Chloride 98 - 111 mmol/L 101  111  104   CO2 22 - 32 mmol/L 27  23  23    Calcium  8.9 - 10.3 mg/dL 8.2  7.3  9.3      Assessment and Plan:  Acute hypoxic respiratory failure secondary to COPD exacerbation Patient with shortness of breath, desaturation and tachycardia Follow-up on CT angio of the chest to rule out PE Continue nebulization Continue Breztri  Patient follows up with Dr. Aleskerov for COPD with asthma Continue steroid therapy with Solu-Medrol  will switch to prednisone  to complete a total of 5 days course Continue supplemental oxygen and wean off as tolerated maintaining saturation between 90 to 92%  Acute toxic encephalopathy in a patient with chronic alcohol abuse Continue to monitor for withdrawal According to patient last alcohol use was a day before presentation We will keep on CIWA protocol Monitor neurochecks Patient with alcohol level 303 Obtain UTOX Continue folic acid  and thiamine   Anxiety disorder/ADHD/depression Resume home medication including buspirone , bupropion  and fluoxetine   DVT prophylaxis-continue Lovenox    Advance Care Planning:   Code Status: Prior full code  Consults: None  Family Communication: None present at bedside  Severity of Illness: The appropriate patient status for this patient is INPATIENT. Inpatient status is judged to be reasonable and necessary in order to provide the required intensity of service to ensure the patient's safety. The patient's presenting symptoms, physical exam findings, and initial radiographic and  laboratory data in the context of their chronic comorbidities is felt to place them at high risk for further clinical  deterioration. Furthermore, it is not anticipated that the patient will be medically stable for discharge from the hospital within 2 midnights of admission.   * I certify that at the point of admission it is my clinical judgment that the patient will require inpatient hospital care spanning beyond 2 midnights from the point of admission due to high intensity of service, high risk for further deterioration and high frequency of surveillance required.*  Author: Drue ONEIDA Potter, MD 09/05/2024 2:15 AM  For on call review [email or web address].

## 2024-09-05 NOTE — ED Triage Notes (Signed)
 Pt arrived via EMS from home for breathing difficulty she states has been going on for for about a week. Hx of Asthma and COPD. EMS gave duoneb x2. Reports she fell a week ago. EMS reports she was Rambert and lethargic when they arrived.  EMS vitals SPO2 75% on RA HR 110 BP 138/86 End tidal in 40's CBG 86

## 2024-09-05 NOTE — Discharge Instructions (Signed)

## 2024-09-06 DIAGNOSIS — G9341 Metabolic encephalopathy: Secondary | ICD-10-CM | POA: Diagnosis not present

## 2024-09-06 LAB — BASIC METABOLIC PANEL WITH GFR
Anion gap: 7 (ref 5–15)
BUN: 20 mg/dL (ref 6–20)
CO2: 29 mmol/L (ref 22–32)
Calcium: 8.6 mg/dL — ABNORMAL LOW (ref 8.9–10.3)
Chloride: 102 mmol/L (ref 98–111)
Creatinine, Ser: 0.61 mg/dL (ref 0.44–1.00)
GFR, Estimated: >60 mL/min (ref >60)
Glucose, Bld: 120 mg/dL — ABNORMAL HIGH (ref 70–99)
Potassium: 4.9 mmol/L (ref 3.5–5.1)
Sodium: 138 mmol/L (ref 135–145)

## 2024-09-06 LAB — PHOSPHORUS: Phosphorus: 3.1 mg/dL (ref 2.5–4.6)

## 2024-09-06 LAB — C-REACTIVE PROTEIN: CRP: <0.5 mg/dL (ref <1.0)

## 2024-09-06 LAB — MAGNESIUM: Magnesium: 2.6 mg/dL — ABNORMAL HIGH (ref 1.7–2.4)

## 2024-09-06 MED ORDER — GUAIFENESIN-DM 100-10 MG/5ML PO SYRP
5.0000 mL | ORAL_SOLUTION | ORAL | Status: DC | PRN
  Administered 2024-09-06 (×3): 5 mL via ORAL
  Filled 2024-09-06 (×3): qty 10

## 2024-09-06 MED ORDER — LAMOTRIGINE 25 MG PO TABS
50.0000 mg | ORAL_TABLET | Freq: Two times a day (BID) | ORAL | Status: DC
  Administered 2024-09-06 – 2024-09-07 (×3): 50 mg via ORAL
  Filled 2024-09-06 (×3): qty 2

## 2024-09-06 NOTE — Evaluation (Signed)
 Occupational Therapy Evaluation Patient Details Name: [name] MRN: [id number] DOB: [birth year] Today's Date: 09/06/2024   History of Present Illness   58 y.o. female with medical history significant of asthma/COPD not on home oxygen, anxiety disorder, ADHD, depression was bibems due to SOB and being lethargic for few days now.  Has history of alcohol abuse, last drink day before presentation.  Patient admitted for further management for acute hypoxic respiratory failure likely due to asthma/COPD exacerbation, acute toxic encephalopathy likely due to alcohol use.     Clinical Impressions Pt was seen for OT evaluation this date. 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 Pt presents with deficits in activity tolerance and balance, affecting safe and optimal ADL completion. Pt currently requires SBA-CGA for mobility and standing ADL without AD, mildly unsteady, dons shoes seated EOB with supv. Pt edu in ECS, PLB, stress mgt strategies. Pt trials use of PLB, verbalizes understanding. Pt would benefit from skilled OT services to address noted impairments and functional limitations (see below for any additional details) in order to maximize safety and independence while minimizing future risk of falls, injury, and readmission. Do not anticipate the need for follow up OT services upon acute hospital DC.    If plan is discharge home, recommend the following:   A little help with walking and/or transfers;Help with stairs or ramp for entrance;Assist for transportation     Functional Status Assessment   Patient has had a recent decline in their functional status and demonstrates the ability to make significant improvements in function in a reasonable and predictable amount of time.     Equipment Recommendations   None recommended by OT      Precautions/Restrictions    Precautions Precautions: Fall Restrictions Weight Bearing Restrictions Per Provider Order: No     Mobility Bed Mobility Overal bed mobility: Modified Independent                  Transfers Overall transfer level: Needs assistance Equipment used: None Transfers: Sit to/from Stand Sit to Stand: Supervision                  Balance Overall balance assessment: Needs assistance Sitting-balance support: No upper extremity supported, Feet supported Sitting balance-Leahy Scale: Good     Standing balance support: No upper extremity supported, During functional activity Standing balance-Leahy Scale: Fair Standing balance comment: fair-, unsteady but no overt LOB, improved slightly with tennis shoes donned versus grippy socks       ADL either performed or assessed with clinical judgement   ADL Overall ADL's : Needs assistance/impaired           Lower Body Dressing: Sitting/lateral leans;Modified independent   Toilet Transfer: Industrial/product designer Details (indicate cue type and reason): simulated         Functional mobility during ADLs: Supervision/safety        Pertinent Vitals/Pain Pain Assessment Pain Assessment: No/denies pain     Extremity/Trunk Assessment Upper Extremity Assessment Upper Extremity Assessment: Right hand dominant;Overall [facility] for tasks assessed   Lower Extremity Assessment Lower Extremity Assessment: Defer to PT evaluation   Cervical / Trunk Assessment Cervical / Trunk Assessment: Normal   Communication Communication Communication: No apparent difficulties   Cognition Arousal: Alert Behavior During Therapy: Anxious Cognition: No apparent impairments       Following commands: Intact       Cueing  General Comments  Cueing Techniques: Verbal cues  Removed 2L [place], on room air pt maintained >91% with mobility, HR in 110's up to 120's briefly with coughing   Exercises Other  Exercises Other Exercises: Pt edu in ECS, PLB, stress mgt strategies   Shoulder Instructions      Home Living Family/patient expects to be discharged to:: Private residence Living Arrangements: Alone Available Help at Discharge: Family;Available 24 hours/day;Available PRN/intermittently Type of Home: House Home Access: Stairs to enter [employer] of Steps: 3 Entrance Stairs-Rails: Left Home Layout: One level     Bathroom Shower/Tub: Walk-in Pensions consultant: Standard Bathroom Accessibility: Yes   Home Equipment: None   Additional Comments: niece going to stay with her a couple weeks      Prior Functioning/Environment Prior Level of Function : Independent/Modified Independent;Driving;Working/employed             Mobility Comments: 1 fall 3 wks ago ADLs Comments: works as a Engineer, 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, dressing)    OT Problem List: Impaired balance (sitting and/or standing);Decreased activity tolerance   OT Treatment/Interventions: Self-care/ADL training;Therapeutic exercise;Energy conservation;DME and/or AE instruction;Therapeutic activities;Patient/family education;Balance training      OT Goals(Current goals can be found in the care plan section)   Acute Rehab OT Goals Patient Stated Goal: go home OT Goal Formulation: With patient Time For Goal Achievement: 09/20/24 Potential to Achieve Goals: Good ADL Goals Additional ADL Goal #1: Pt will return demo use of learned breathing techniques with mod indep during ADL/mobility, 3/3 opportunities. Additional ADL Goal #2: Pt will verbalize plan to implement at least 2 learned ECS to support safety/ADL/IADL participation.   OT Frequency:  Min 1X/week    Co-evaluation              AM-PAC OT 6 Clicks Daily Activity     Outcome Measure Help from another person eating meals?: None Help from another person taking care of personal grooming?: None Help from  another person toileting, which includes using toliet, bedpan, or urinal?: A Little Help from another person bathing (including washing, rinsing, drying)?: A Little Help from another person to put on and taking off regular upper body clothing?: None Help from another person to put on and taking off regular lower body clothing?: A Little 6 Click Score: 21   End of Session Nurse Communication: Mobility status;Other (comment) (SpO2, pt wants new nebulizer at discharge)  Activity Tolerance: Patient tolerated treatment well Patient left: in chair;with call bell/phone within reach;with chair alarm set  OT Visit Diagnosis: Unsteadiness on feet (R26.81)                Time: 9168-9147 OT Time Calculation (min): 21 min Charges:  OT General Charges $OT Visit: 1 Visit OT Evaluation $OT Eval Low Complexity: 1 Low OT Treatments $Therapeutic Activity: 8-22 mins  Warren SAUNDERS., MPH, MS, OTR/L ascom [phone] 09/06/24, 9:02 AM

## 2024-09-06 NOTE — Evaluation (Signed)
 Physical Therapy Evaluation Patient Details Name: [name] MRN: [id number] DOB: 08/04/1966 Today's Date: 09/06/2024  History of Present Illness  58 y.o. female with medical history significant of asthma/COPD not on home oxygen, anxiety disorder, ADHD, depression was bibems due to SOB and being lethargic for few days now.  Has history of alcohol abuse, last drink day before presentation.  Patient admitted for further management for acute hypoxic respiratory failure likely due to asthma/COPD exacerbation, acute toxic encephalopathy likely due to alcohol use.  Clinical Impression  Patient up in recliner upon arrival to room; alert and oriented, follows commands and agreeable to participation with session.   Bilat UE/LE strength and ROM grossly symmetrical and WFL; no focal weakness appreciated. Mild tremor noted throughout all extremities.  Denies pain. Able to complete sit/stand, basic transfers and gait (220') with RW, cga/close sup. Demonstrates reciprocal stepping pattern with fair step height/length; improved comfort/confidence and overal cadence, fluidity with use of RW. Patient prefers to maintain use at this time. Mild SOB with gait efforts; sats 88-89% on RA with gait, improves >90% within 30 seconds of seated rest period. Higher level balance deficits evidenced by BERG 43/56 (difficulty with narrowed, tandem and SLS activities); do anticipate these will improve as medical status improves. Would benefit from skilled PT to address above deficits and promote optimal return to PLOF.; recommend post-acute PT follow up as indicated by interdisciplinary care team.            If plan is discharge home, recommend the following: A little help with walking and/or transfers;A little help with bathing/dressing/bathroom   Can travel by private vehicle        Equipment Recommendations Rolling walker (2 wheels)  Recommendations for Other Services       Functional Status Assessment Patient has  had a recent decline in their functional status and demonstrates the ability to make significant improvements in function in a reasonable and predictable amount of time.     Precautions / Restrictions Precautions Precautions: Fall Restrictions Weight Bearing Restrictions Per Provider Order: No      Mobility  Bed Mobility               General bed mobility comments: seated in recliner beginning/end of treatment session    Transfers Overall transfer level: Needs assistance Equipment used: Rolling walker (2 wheels) Transfers: Sit to/from Stand Sit to Stand: Supervision                Ambulation/Gait Ambulation/Gait assistance: Supervision Gait Distance (Feet): 220 Feet Assistive device: Rolling walker (2 wheels)   Gait velocity: 10' walk time, 7-8 seconds     General Gait Details: reciprocal stepping pattern with fair step height/length; improved comfort/confidence and overal cadence, fluidity with use of RW.  Patient prefers to maintain use at this time  Acupuncturist Bed    Modified Rankin (Stroke Patients Only)       Balance Overall balance assessment: Needs assistance Sitting-balance support: No upper extremity supported, Feet supported Sitting balance-Leahy Scale: Good     Standing balance support: No upper extremity supported, During functional activity Standing balance-Leahy Scale: Fair                   Standardized Balance Assessment Standardized Balance Assessment : Berg Balance Test Berg Balance Test Sit to Stand: Able to stand without using hands and stabilize independently Standing Unsupported: Able to  stand safely 2 minutes Sitting with Back Unsupported but Feet Supported on Floor or Stool: Able to sit safely and securely 2 minutes Stand to Sit: Sits safely with minimal use of hands Transfers: Able to transfer safely, definite need of hands Standing Unsupported with Eyes Closed: Able to  stand 10 seconds with supervision Standing Ubsupported with Feet Together: Able to place feet together independently and stand for 1 minute with supervision From Standing, Reach Forward with Outstretched Arm: Can reach forward >12 cm safely (5) From Standing Position, Pick up Object from Floor: Able to pick up shoe safely and easily From Standing Position, Turn to Look Behind Over each Shoulder: Looks behind from both sides and weight shifts well Turn 360 Degrees: Able to turn 360 degrees safely in 4 seconds or less Standing Unsupported, Alternately Place Feet on Step/Stool: Able to complete >2 steps/needs minimal assist Standing Unsupported, One Foot in Front: Able to take small step independently and hold 30 seconds Standing on One Leg: Unable to try or needs assist to prevent fall Total Score: 43         Pertinent Vitals/Pain Pain Assessment Pain Assessment: No/denies pain    Home Living Family/patient expects to be discharged to:: Private residence Living Arrangements: Alone Available Help at Discharge: Family;Available 24 hours/day;Available PRN/intermittently Type of Home: House Home Access: Stairs to enter Entrance Stairs-Rails: Left Entrance Stairs-Number of Steps: 3   Home Layout: One level Home Equipment: None Additional Comments: niece going to stay with her a couple weeks    Prior Function Prior Level of Function : Independent/Modified Independent;Driving;Working/employed             Mobility Comments: 1 fall 3 wks ago ADLs Comments: works as a Engineer, 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, dressing)     Extremity/Trunk Assessment   Upper Extremity Assessment Upper Extremity Assessment: Overall WFL for tasks assessed (grossly at least 4+/5; mild tremors throughout)    Lower Extremity Assessment Lower Extremity Assessment: Overall WFL for tasks assessed (grossly at least 4+/5 throughout; mild tremors throughout)    Cervical / Trunk  Assessment Cervical / Trunk Assessment: Normal  Communication   Communication Communication: No apparent difficulties    Cognition Arousal: Alert Behavior During Therapy: WFL for tasks assessed/performed, Anxious   PT - Cognitive impairments: No apparent impairments                         Following commands: Intact       Cueing Cueing Techniques: Verbal cues     General Comments General comments (skin integrity, edema, etc.): Removed 2L [place], on room air pt maintained >91% with mobility, HR in 110's up to 120's briefly with coughing    Exercises     Assessment/Plan    PT Assessment Patient needs continued PT services  PT Problem List Decreased activity tolerance;Decreased balance;Decreased mobility;Decreased coordination;Decreased knowledge of use of DME;Decreased knowledge of precautions;Decreased safety awareness       PT Treatment Interventions DME instruction;Gait training;Functional mobility training;Therapeutic activities;Therapeutic exercise;Patient/family education;Stair training;Balance training    PT Goals (Current goals can be found in the Care Plan section)  Acute Rehab PT Goals Patient Stated Goal: to return home PT Goal Formulation: With patient Time For Goal Achievement: 09/20/24 Potential to Achieve Goals: Good    Frequency Min 2X/week     Co-evaluation               AM-PAC PT 6 Clicks Mobility  Outcome Measure Help  needed turning from your back to your side while in a flat bed without using bedrails?: None Help needed moving from lying on your back to sitting on the side of a flat bed without using bedrails?: None Help needed moving to and from a bed to a chair (including a wheelchair)?: A Little Help needed standing up from a chair using your arms (e.g., wheelchair or bedside chair)?: A Little Help needed to walk in hospital room?: A Little Help needed climbing 3-5 steps with a railing? : A Little 6 Click Score: 20    End of  Session   Activity Tolerance: Patient tolerated treatment well Patient left: in chair;with call bell/phone within reach;with chair alarm set   PT Visit Diagnosis: Muscle weakness (generalized) (M62.81);Difficulty in walking, not elsewhere classified (R26.2)    Time: 9088-9073 PT Time Calculation (min) (ACUTE ONLY): 15 min   Charges:   PT Evaluation $PT Eval Low Complexity: 1 Low   PT General Charges $$ ACUTE PT VISIT: 1 Visit        Yuki Purves H. Delores, PT, DPT, NCS 09/06/24, 11:18 AM [phone]

## 2024-09-06 NOTE — Progress Notes (Signed)
 PULMONOLOGY         Date: 09/06/2024,   MRN# [id number] [name] 1966-06-10     Admission                  Current   Referring provider: Dr Jerrilyn   CHIEF COMPLAINT:   Acute hypoxemic respiratory failure   HISTORY OF PRESENT ILLNESS    [name] is a 58 y.o. female with medical history significant of Asth and COPD overlpa syndrome she has been on biologic therapy with dupulimab and her insurance stopped covering it so she ended up with worsening dyspena and cough since May 2025.  She admits to starting to smoke again and this made things much worse and ended up with severe respiratory symptoms and cough. She was brought in by EMS due to hypoxemia.  She denies having sick contacts. She previously had alcoholism and recently relapsed.  She endorses stress due to school.  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    09/06/24- patient is imporved now off supplemental oxygen.  She is rhonchorous and is wheezing bilaterally but markedly improved compared to initial presentation.  She has worked with PT/OT.  She can be dcd home in AM tommorow.  Please continue current plan she's recovering well.    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   Sleep apnea    mild   SUI (stress urinary incontinence, female)      SURGICAL HISTORY   Past Surgical History:  Procedure Laterality Date   BLADDER SUSPENSION N/A 02/13/2013    Procedure: [facility] SLING;  Surgeon: Norleen JINNY Seltzer, MD;  Location: [facility];  Service: Urology;  Laterality: N/A;   CESAREAN SECTION  12-23-2004    W/ LEFT TUBAL LIGATION AND RIGHT SALPINGECTOMY   CESAREAN SECTION  1995   CYSTOSCOPY WITH STENT PLACEMENT Bilateral 06/16/2020   Procedure: CYSTOSCOPY WITH FIREFLY INJECTION;  Surgeon: Carolee Sherwood JONETTA DOUGLAS, MD;  Location: [facility];  Service: Urology;  Laterality: Bilateral;   IR RADIOLOGIST EVAL & MGMT  04/21/2020   IR RADIOLOGIST EVAL & MGMT  05/12/2020   ORIF HUMERUS FRACTURE Right 04/28/2014   Procedure: OPEN REDUCTION INTERNAL FIXATION (ORIF) PROXIMAL HUMERUS FRACTURE;  Surgeon: Eva Elsie Herring, MD;  Location: [facility];  Service: Orthopedics;  Laterality: Right;   ORIF PROXIMAL HUMERUS FRACTURE Right 04/28/2014   DR CHANDLER   PROCTOSCOPY N/A 06/16/2020   Procedure: RIGID PROCTOSCOPY;  Surgeon: Sheldon Standing, MD;  Location: [facility];  Service: General;  Laterality: N/A;   TUBAL LIGATION       FAMILY HISTORY   Family History  Adopted: Yes     SOCIAL HISTORY   Social History   Tobacco Use   Smoking status: Every Day    Current packs/day: 1.00    Average packs/day: 0.4 packs/day for 23.8 years (8.8 ttl pk-yrs)    Types: Cigarettes  Start date: 09/24/1999    Last attempt to quit: 09/24/2019   Smokeless tobacco: Never  Vaping Use   Vaping status: Every Day   Substances: Nicotine   Substance Use Topics   Alcohol use: Yes    Alcohol/week: 2.0 standard drinks of alcohol    Types: 2 Glasses of wine per week    Comment: 12/29/20 restarted    Drug use: No     MEDICATIONS     Current Medication:  Current Facility-Administered Medications:    acetaminophen  (TYLENOL ) tablet 650 mg, 650 mg, Oral, Q6H PRN, Dorinda Drue DASEN, MD, 650 mg at 09/06/24 0041   budesonide -glycopyrrolate -formoterol  (BREZTRI ) 160-9-4.8 MCG/ACT inhaler 2 puff, 2 puff, Inhalation, BID, Dorinda Drue DASEN, MD, 2 puff at 09/06/24 9183   buPROPion   (WELLBUTRIN  XL) 24 hr tablet 150 mg, 150 mg, Oral, Daily, Dorinda Drue T, MD, 150 mg at 09/06/24 9188   busPIRone  (BUSPAR ) tablet 10 mg, 10 mg, Oral, BID, Djan, Prince T, MD, 10 mg at 09/06/24 9188   enoxaparin  (LOVENOX ) injection 40 mg, 40 mg, Subcutaneous, Q24H, Djan, Drue DASEN, MD, 40 mg at 09/06/24 9187   FLUoxetine  (PROZAC ) capsule 20 mg, 20 mg, Oral, q morning, Djan, Drue DASEN, MD, 20 mg at 09/06/24 9187   folic acid  (FOLVITE ) tablet 1 mg, 1 mg, Oral, Daily, Djan, Drue DASEN, MD, 1 mg at 09/06/24 9188   guaiFENesin -dextromethorphan  (ROBITUSSIN DM) 100-10 MG/5ML syrup 5 mL, 5 mL, Oral, Q4H PRN, Ponnala, Shruthi, MD, 5 mL at 09/06/24 9178   lamoTRIgine  (LAMICTAL ) tablet 50 mg, 50 mg, Oral, BID, Ponnala, Shruthi, MD   levalbuterol  (XOPENEX ) nebulizer solution 0.63 mg, 0.63 mg, Nebulization, Q8H, Analyah Mcconnon, MD, 0.63 mg at 09/06/24 9287   LORazepam  (ATIVAN ) tablet 1-4 mg, 1-4 mg, Oral, Q1H PRN, 2 mg at 09/06/24 1005 **OR** LORazepam  (ATIVAN ) injection 1-4 mg, 1-4 mg, Intravenous, Q1H PRN, Dorinda Drue DASEN, MD, 2 mg at 09/05/24 1301   LORazepam  (ATIVAN ) tablet 0-4 mg, 0-4 mg, Oral, Q4H, 2 mg at 09/06/24 0811 **FOLLOWED BY** [START ON 09/07/2024] LORazepam  (ATIVAN ) tablet 0-4 mg, 0-4 mg, Oral, Q8H, Ponnala, Shruthi, MD   multivitamin with minerals tablet 1 tablet, 1 tablet, Oral, Daily, Djan, Drue DASEN, MD, 1 tablet at 09/06/24 9188   nicotine  (NICODERM CQ  - dosed in mg/24 hours) patch 21 mg, 21 mg, Transdermal, Daily, Ponnala, Shruthi, MD, 21 mg at 09/06/24 9188   nicotine  polacrilex (NICORETTE ) gum 2 mg, 2 mg, Oral, PRN, Ponnala, Shruthi, MD   predniSONE  (DELTASONE ) tablet 40 mg, 40 mg, Oral, Q breakfast, Djan, Prince T, MD, 40 mg at 09/06/24 9188   thiamine  (VITAMIN B1) tablet 100 mg, 100 mg, Oral, Daily, Djan, Prince T, MD, 100 mg at 09/06/24 9188    ALLERGIES   Hydrocodone      REVIEW OF SYSTEMS    Review of Systems:  Gen:  Denies  fever, sweats, chills weigh loss  HEENT: Denies  blurred vision, double vision, ear pain, eye pain, hearing loss, nose bleeds, sore throat Cardiac:  No dizziness, chest pain or heaviness, chest tightness,edema Resp:   reports dyspnea chronically  Gi: Denies swallowing difficulty, stomach pain, nausea or vomiting, diarrhea, constipation, bowel incontinence Gu:  Denies bladder incontinence, burning urine Ext:   Denies Joint pain, stiffness or swelling Skin: Denies  skin rash, easy bruising or bleeding or hives Endoc:  Denies polyuria, polydipsia , polyphagia or weight change Psych:   Denies depression, insomnia or hallucinations   Other:  All other systems negative   VS: BP 110/68  Pulse (!) 106   Temp 98.2 F (36.8 C)   Resp 17   SpO2 91%      PHYSICAL EXAM    GENERAL:NAD, no fevers, chills, no weakness no fatigue HEAD: Normocephalic, atraumatic.  EYES: Pupils equal, round, reactive to light. Extraocular muscles intact. No scleral icterus.  MOUTH: Moist mucosal membrane. Dentition intact. No abscess noted.  EAR, NOSE, THROAT: Clear without exudates. No external lesions.  NECK: Supple. No thyromegaly. No nodules. No JVD.  PULMONARY: decreased breath sounds with mild rhonchi worse at bases bilaterally.  CARDIOVASCULAR: S1 and S2. Regular rate and rhythm. No murmurs, rubs, or gallops. No edema. Pedal pulses 2+ bilaterally.  GASTROINTESTINAL: Soft, nontender, nondistended. No masses. Positive bowel sounds. No hepatosplenomegaly.  MUSCULOSKELETAL: No swelling, clubbing, or edema. Range of motion full in all extremities.  NEUROLOGIC: Cranial nerves II through XII are intact. No gross focal neurological deficits. Sensation intact. Reflexes intact.  SKIN: No ulceration, lesions, rashes, or cyanosis. Skin warm and dry. Turgor intact.  PSYCHIATRIC: Mood, affect within normal limits. The patient is awake, alert and oriented x 3. Insight, judgment intact.       IMAGING     ASSESSMENT/PLAN   Acute hypoxemic respiratory failure     - Patient with Severe persistent asthma COPD overlap (ACOS)    -She is very sensitive to bronchodilators with Sinus tachycardia, I have ordered xopenex .  At home she also uses ensifentrine  BID via nebulizer and budesonide  via nebulizer     -CRP     -PCR for COVID/FLU/RSV is negative     - RVP is in process    - She received IV solumedrol 125mg  and is now on prednisone     - recommendation for PT/OT and DC planning   -patient needs to resume dupulimab q 2wk IM injection post discharge   Tracheobronchomalacia     - can work on this on outpatient as this may require surgical intervention via thoracic surgery   Alcoholism and Tobacco dependence     - nicotine  replacement      - she requests gum     -on CIWA         Thank you for allowing me to participate in the care of this patient.   Patient/Family are satisfied with care plan and all questions have been answered.    Provider disclosure: Patient with at least one acute or chronic illness or injury that poses a threat to life or bodily function and is being managed actively during this encounter.  All of the below services have been performed independently by signing provider:  review of prior documentation from internal and or external health records.  Review of previous and current lab results.  Interview and comprehensive assessment during patient visit today. Review of current and previous chest radiographs/CT scans. Discussion of management and test interpretation with health care team and patient/family.   This document was prepared using Dragon voice recognition software and may include unintentional dictation errors.     Gurnie Duris, M.D.  Division of Pulmonary & Critical Care Medicine

## 2024-09-06 NOTE — Progress Notes (Signed)
 PROGRESS NOTE    [name]  FMW:[id number] DOB: 1966/03/30 DOA: 09/05/2024 PCP: Stanton Lynwood FALCON, MD  Chief Complaint  Patient presents with   Respiratory Distress    Hospital Course:  [name] is a 58 y.o. female with medical history significant of asthma/COPD not on home oxygen, anxiety disorder, ADHD, depression was bibems due to SOB and being lethargic for few days now.  Has history of alcohol abuse, last drink day before presentation.  Patient admitted for further management for acute hypoxic respiratory failure likely due to asthma/COPD exacerbation, acute toxic encephalopathy likely due to alcohol use.  Hospital course as below  Subjective: Patient was examined at bedside, states she feels significantly better.  Denies SOB, is off supplemental Continues to have tremors, tachycardic, slightly better than yesterday Receiving multiple doses of Ativan , will continue to monitor for alcohol withdrawal Anticipate discharge tomorrow AM   Objective: Vitals:   09/06/24 1300 09/06/24 1323 09/06/24 1425 09/06/24 1427  BP:      Pulse: 98  (!) 114 (!) 102  Resp:      Temp:      TempSrc:      SpO2:  95%      Examination: General: Anxious, tremulous Cardiovascular: Tachycardic, No murmurs heard Pulmonary: b/l wheezing, improved significantly Abdominal: soft, non tender Neurological: AO x3, no gross focal deficits  Assessment & Plan:   Acute hypoxic respiratory failure  secondary to Asthma/ COPD exacerbation - Patient with shortness of breath, desaturation and tachycardia, requiring 2L [place] - now on RA - Flu/COVID/RSV negative - s/p Solu-Medrol  will switch to prednisone  to complete a total of 5 days course - Home inhalers, Albuterol  changed to Xopenex  due to tachycardia - states just completed course of doxycycline - Seen by Pulmonary, appreciate recs - Resume Dupulimab at discharge  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  -- PT/OT rec OP PT  DVT prophylaxis: Lovenox  SQ   Code Status: Full Code Disposition:  TBD  Consultants:  Pulmonary  Procedures:  None  Antimicrobials:  Anti-infectives (From admission, onward)    Start     Dose/Rate Route Frequency Ordered Stop   09/05/24 1015  doxycycline (VIBRA-TABS) tablet 100 mg  Status:  Discontinued        100 mg Oral Every 12 hours 09/05/24 1003 09/05/24 1034       Data Reviewed: I have personally reviewed following labs and imaging studies CBC: Recent Labs  Lab 09/05/24 0023 09/05/24 0216 09/05/24 0423  WBC 10.2 6.7 6.0  NEUTROABS 3.6  --   --   HGB 14.8 13.5 13.7  HCT 46.1* 41.2 42.3  MCV 98.1 96.7 97.7  PLT 203 160 160   Basic Metabolic Panel: Recent Labs  Lab 09/05/24 0023 09/05/24 0423 09/06/24 0456  NA 142 141 138  K 4.1 3.7 4.9  CL 101 102 102  CO2 27 25 29   GLUCOSE 95 119* 120*  BUN 14 15 20   CREATININE 0.73 0.62 0.61  CALCIUM  8.2* 7.6* 8.6*  MG  --  2.3 2.6*  PHOS  --  4.6 3.1   GFR: Estimated Creatinine Clearance: 69.8 mL/min (by C-G formula based on SCr of 0.61 mg/dL). Liver Function Tests: Recent Labs  Lab 09/05/24 0023  AST 38  ALT 32  ALKPHOS 118  BILITOT 0.3  PROT 6.5  ALBUMIN 3.8   CBG: No results for input(s): GLUCAP in the last 168  hours.   Recent Results (from the past 240 hours)  Resp panel by RT-PCR (RSV, Flu A&B, Covid) Anterior Nasal Swab     Status: None   Collection Time: 08/30/24  1:40 AM   Specimen: Anterior Nasal Swab  Result Value Ref Range Status   SARS Coronavirus 2 by RT PCR NEGATIVE NEGATIVE Final    Comment: (NOTE) SARS-CoV-2 target nucleic acids are NOT DETECTED.  The SARS-CoV-2 RNA is generally detectable in upper respiratory specimens during the acute phase of infection. The lowest concentration of SARS-CoV-2 viral copies this assay can detect is 138 copies/mL. A negative result does not preclude SARS-Cov-2 infection and should not be used as the sole basis for treatment or other patient management decisions. A negative result may occur with  improper specimen collection/handling, submission of specimen other than nasopharyngeal swab, presence of viral mutation(s) within the areas targeted by this assay, and inadequate number of viral copies(<138 copies/mL). A negative result must be combined with clinical observations, patient history, and epidemiological information. The expected result is Negative.  Fact Sheet for Patients:  BloggerCourse.com  Fact Sheet for Healthcare Providers:  SeriousBroker.it  This test is no t yet approved or cleared by the [place]  FDA and  has been authorized for detection and/or diagnosis of SARS-CoV-2 by FDA under an Emergency Use Authorization (EUA). This EUA will remain  in effect (meaning this test can be used) for the duration of the COVID-19 declaration under Section 564(b)(1) of the Act, 21 U.S.C.section 360bbb-3(b)(1), unless the authorization is terminated  or revoked sooner.       Influenza A by PCR NEGATIVE NEGATIVE Final   Influenza B by PCR NEGATIVE NEGATIVE Final    Comment: (NOTE) The Xpert Xpress SARS-CoV-2/FLU/RSV plus assay is intended as an aid in the diagnosis of influenza from  Nasopharyngeal swab specimens and should not be used as a sole basis for treatment. Nasal washings and aspirates are unacceptable for Xpert Xpress SARS-CoV-2/FLU/RSV testing.  Fact Sheet for Patients: BloggerCourse.com  Fact Sheet for Healthcare Providers: SeriousBroker.it  This test is not yet approved or cleared by the [place]  FDA and has been authorized for detection and/or diagnosis of SARS-CoV-2 by FDA under an Emergency Use Authorization (EUA). This EUA will remain in effect (meaning this test can be used) for the duration of the COVID-19 declaration under Section 564(b)(1) of the Act, 21 U.S.C. section 360bbb-3(b)(1), unless the authorization is terminated or revoked.     Resp Syncytial Virus by PCR NEGATIVE NEGATIVE Final    Comment: (NOTE) Fact Sheet for Patients: BloggerCourse.com  Fact Sheet for Healthcare Providers: SeriousBroker.it  This test is not yet approved or cleared by the [place]  FDA and has been authorized for detection and/or diagnosis of SARS-CoV-2 by FDA under an Emergency Use Authorization (EUA). This EUA will remain in effect (meaning this test can be used) for the duration of the COVID-19 declaration under Section 564(b)(1) of the Act, 21  U.S.C. section 360bbb-3(b)(1), unless the authorization is terminated or revoked.  Performed at [facility], [address] [place], [place] [address]   Resp panel by RT-PCR (RSV, Flu A&B, Covid) Anterior Nasal Swab     Status: None   Collection Time: 09/05/24 12:24 AM   Specimen: Anterior Nasal Swab  Result Value Ref Range Status   SARS Coronavirus 2 by RT PCR NEGATIVE NEGATIVE Final    Comment: (NOTE) SARS-CoV-2 target nucleic acids are NOT DETECTED.  The SARS-CoV-2 RNA is generally detectable in upper respiratory specimens during the acute phase of infection. The lowest concentration  of SARS-CoV-2 viral copies this assay can detect is 138 copies/mL. A negative result does not preclude SARS-Cov-2 infection and should not be used as the sole basis for treatment or other patient management decisions. A negative result may occur with  improper specimen collection/handling, submission of specimen other than nasopharyngeal swab, presence of viral mutation(s) within the areas targeted by this assay, and inadequate number of viral copies(<138 copies/mL). A negative result must be combined with clinical observations, patient history, and epidemiological information. The expected result is Negative.  Fact Sheet for Patients:  BloggerCourse.com  Fact Sheet for Healthcare Providers:  SeriousBroker.it  This test is no t yet approved or cleared by the [place]  FDA and  has been authorized for detection and/or diagnosis of SARS-CoV-2 by FDA under an Emergency Use Authorization (EUA). This EUA will remain  in effect (meaning this test can be used) for the duration of the COVID-19 declaration under Section 564(b)(1) of the Act, 21 U.S.C.section 360bbb-3(b)(1), unless the authorization is terminated  or revoked sooner.       Influenza A by PCR NEGATIVE NEGATIVE Final   Influenza B by PCR NEGATIVE NEGATIVE Final    Comment: (NOTE) The Xpert Xpress SARS-CoV-2/FLU/RSV plus assay is intended as an aid in the diagnosis of influenza from Nasopharyngeal swab specimens and should not be used as a sole basis for treatment. Nasal washings and aspirates are unacceptable for Xpert Xpress SARS-CoV-2/FLU/RSV testing.  Fact Sheet for Patients: BloggerCourse.com  Fact Sheet for Healthcare Providers: SeriousBroker.it  This test is not yet approved or cleared by the [place]  FDA and has been authorized for detection and/or diagnosis of SARS-CoV-2 by FDA under an Emergency Use  Authorization (EUA). This EUA will remain in effect (meaning this test can be used) for the duration of the COVID-19 declaration under Section 564(b)(1) of the Act, 21 U.S.C. section 360bbb-3(b)(1), unless the authorization is terminated or revoked.     Resp Syncytial Virus by PCR NEGATIVE NEGATIVE Final    Comment: (NOTE) Fact Sheet for Patients: BloggerCourse.com  Fact Sheet for Healthcare Providers: SeriousBroker.it  This test is not yet approved or cleared by the [place]  FDA and has been authorized for detection and/or diagnosis of SARS-CoV-2 by FDA under an Emergency Use Authorization (EUA). This EUA will remain in effect (meaning this test can be used) for the duration of the COVID-19 declaration under Section 564(b)(1) of the Act, 21 U.S.C. section 360bbb-3(b)(1), unless the authorization is terminated or revoked.  Performed at [facility], [address], [place], [place] [address]      Radiology Studies: CT Angio Chest Pulmonary Embolism (PE) W or WO Contrast Result Date: 09/05/2024 EXAM: CTA CHEST 09/05/2024 03:52:05 AM TECHNIQUE: CTA of the chest was performed after the administration of intravenous contrast. Without and with IV contrast. 75mL iohexol  (OMNIPAQUE ) 350 MG/ML injection. Multiplanar reformatted images are provided for review. MIP  images are provided for review. Automated exposure control, iterative reconstruction, and/or weight based adjustment of the mA/kV was utilized to reduce the radiation dose to as low as reasonably achievable. COMPARISON: CT angiogram of the chest dated 11/26/2023. CLINICAL HISTORY: Pulmonary embolism (PE) suspected, low to intermediate prob, neg D-dimer. Pt arrived via EMS from home for breathing difficulty she states has been going on for for about a week. Hx of Asthma and COPD. EMS gave duoneb x2. Reports she fell a week ago. EMS reports she was [place] and lethargic when  they arrived. FINDINGS: PULMONARY ARTERIES: Pulmonary arteries are adequately opacified for evaluation. No acute pulmonary embolus. Main pulmonary artery is normal in caliber. MEDIASTINUM: The heart and pericardium demonstrate no acute abnormality. There is fusiform ectasia of the ascending thoracic aorta, which measures approximately 3.9 cm in diameter. LYMPH NODES: No mediastinal, hilar or axillary lymphadenopathy. LUNGS AND PLEURA: There are streaky peribronchovascular opacities present within the base of the right lower lobe. There is minimal atelectasis also present dependently within the left lower lobe. There is mild central lobular emphysema present within the lung apices. There is marked narrowing of the AP diameter of the tracheal lumen which narrows to as little as 3 mm. The mainstem bronchi are also mildly narrowed. There is mild bronchial wall thickening within the right lower lobe. No focal consolidation or pulmonary edema. No evidence of pleural effusion or pneumothorax. UPPER ABDOMEN: Limited images of the upper abdomen are unremarkable. SOFT TISSUES AND BONES: The patient is status post orthopedic fixation of the right humerus. No acute bone or soft tissue abnormality. IMPRESSION: 1. No evidence of pulmonary embolus. 2. Marked tracheal narrowing to approximately 3 mm AP diameter with mild mainstem bronchial narrowing, compatible with tracheobronchial malalacia. 3. Streaky peribronchovascular opacities in the right lower lobe and minimal dependent atelectasis in the left lower lobe. 4. Mild centrilobular emphysema in the lung apices. 5. 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 Electronically signed by: Evalene Coho MD 09/05/2024 04:17 AM EDT RP Workstation: [id number]   DG Chest Port 1 View Result Date: 09/05/2024 CLINICAL DATA:  Difficulty breathing EXAM: PORTABLE CHEST 1 VIEW COMPARISON:  08/30/2024 FINDINGS: Hardware in the right humerus. No acute  airspace disease or effusion. Stable cardiomediastinal silhouette. No pneumothorax. Chronic right-sided rib fractures. IMPRESSION: No active disease. Electronically Signed   By: Luke Bun M.D.   On: 09/05/2024 01:26   DG Lumbar Spine 2-3 Views Result Date: 09/05/2024 CLINICAL DATA:  Pain after fall EXAM: LUMBAR SPINE - 2-3 VIEW COMPARISON:  CT 12/22/2021 FINDINGS: Mild dextrocurvature. Vertebral body heights are grossly maintained. The disc spaces are relatively patent. Lower lumbar facet degenerative changes. IMPRESSION: No definite acute osseous abnormality Electronically Signed   By: Luke Bun M.D.   On: 09/05/2024 01:25    Scheduled Meds:  budesonide -glycopyrrolate -formoterol   2 puff Inhalation BID   buPROPion   150 mg Oral Daily   busPIRone   10 mg Oral BID   enoxaparin  (LOVENOX ) injection  40 mg Subcutaneous Q24H   FLUoxetine   20 mg Oral q morning   folic acid   1 mg Oral Daily   lamoTRIgine   50 mg Oral BID   levalbuterol   0.63 mg Nebulization Q8H   LORazepam   0-4 mg Oral Q4H   Followed by   NOREEN ON 09/07/2024] LORazepam   0-4 mg Oral Q8H   multivitamin with minerals  1 tablet Oral Daily   nicotine   21 mg Transdermal Daily   predniSONE   40 mg Oral Q  breakfast   thiamine   100 mg Oral Daily   Continuous Infusions:   LOS: 1 day  MDM: Patient is high risk for one or more organ failure.  They necessitate ongoing hospitalization for continued IV therapies and subsequent lab monitoring. Total time spent interpreting labs and vitals, reviewing the medical record, coordinating care amongst consultants and care team members, directly assessing and discussing care with the patient and/or family: 35 min Laree Lock, MD [facility]  To contact the attending physician between 7A-7P please use Epic Chat. To contact the covering physician during after hours 7P-7A, please review Amion.  09/06/2024, 2:54 PM   *This document has been created with the assistance of dictation  software. Please excuse typographical errors. *

## 2024-09-07 DIAGNOSIS — G9341 Metabolic encephalopathy: Secondary | ICD-10-CM | POA: Diagnosis not present

## 2024-09-07 LAB — C-REACTIVE PROTEIN: CRP: <0.5 mg/dL (ref <1.0)

## 2024-09-07 MED ORDER — PREDNISONE 20 MG PO TABS: 40.0000 mg | ORAL_TABLET | Freq: Every day | ORAL | 0 refills | Status: DC

## 2024-09-07 MED ORDER — GUAIFENESIN-DM 100-10 MG/5ML PO SYRP: 5.0000 mL | ORAL_SOLUTION | ORAL | 0 refills | Status: AC | PRN

## 2024-09-07 MED ORDER — LORAZEPAM 1 MG PO TABS: ORAL_TABLET | ORAL | 0 refills | Status: AC

## 2024-09-07 NOTE — TOC Transition Note (Addendum)
 Transition of Care [facility]) - Discharge Note   Patient Details  Name: [name] MRN: [id number] Date of Birth: July 24, 1966  [facility]) CM/SW Contact:  Victory Jackquline RAMAN, RN Phone Number: 09/07/2024, 3:55 PM   Clinical Narrative:   RNCM, spoke with the patient at the bedside. I introduced myself, my role, and explained that discharge planning recommendations would be discussed. PT recommended Home with Outpatient/PT. Patient is in agreement with Outpatient PT and chose [facility]. Pt sates that she needed a nebulizer because the one she has she brought from [employer] working. MD made aware and asked for script for nebulizer. Pt has discharge orders. No further concerns. RNCM Signing off.  5:54PM: Referral Faxed via Fax Machine and Email   Final next level of care: Home/Self Care Barriers to Discharge: Barriers Resolved   Patient Goals and CMS Choice            Discharge Placement                Patient to be transferred to facility by: Spouse   Patient and family notified of of transfer: 09/07/24  Discharge Plan and Services Additional resources added to the After Visit Summary for                                       Social Drivers of Health (SDOH) Interventions SDOH Screenings   Food Insecurity: No Food Insecurity (09/06/2024)  Housing: Low Risk  (09/06/2024)  Transportation Needs: No Transportation Needs (09/06/2024)  Utilities: Not At Risk (09/06/2024)  Alcohol Screen: Medium Risk (08/22/2023)  Depression (PHQ2-9): High Risk (01/12/2024)  Financial Resource Strain: Low Risk  (04/22/2024)   Received from [facility]  Tobacco Use: High Risk (08/30/2024)     Readmission Risk Interventions     No data to display

## 2024-09-07 NOTE — TOC CM/SW Note (Signed)
     [facility] REFERRAL        Occupational Therapy * Physical Therapy * Speech Therapy                           DATE: 10/5/225  PATIENT NAME [name] PATIENT MRN: [id number]        DIAGNOSIS/DIAGNOSIS CODE: Acute metabolic encephalopathy   DATE OF DISCHARGE: 09/07/2024       PRIMARY CARE PHYSICIAN: Lynwood Memos    PCP PHONE: [phone]     Dear Provider [name] [place]     Fax: [phone]   I certify that I have examined this patient and that occupational/physical/speech therapy is necessary on an outpatient basis.    The patient has expressed interest in completing their recommended course of therapy at your  location.  Once a formal order from the patient's primary care physician has been obtained, please  contact him/her to schedule an appointment for evaluation at your earliest convenience.   [ X ]  Physical Therapy Evaluate and Treat    The patient's primary care physician (listed above) must furnish and be responsible for a formal order such that the recommended services may be furnished while under the primary physician's care, and that the plan of care will be established and reviewed every 30 days (or more often if condition necessitates).

## 2024-09-07 NOTE — Progress Notes (Signed)
Patient resting at this time, no distress.

## 2024-09-07 NOTE — Discharge Summary (Signed)
 Physician Discharge Summary   Patient: [name] MRN: [id number] DOB: 03-06-1966  Admit date:     09/05/2024  Discharge date: 09/07/24  Discharge Physician: Laree Lock   PCP: Stanton Lynwood FALCON, MD   Recommendations at discharge:   Follow up with PCP in 2-4weeks Incidental findings: Fusiform ectasia of thoracic aorta  Follow-up with pulmonary in 2-weeks Outpatient physical therapy   Discharge Diagnoses: Principal Problem:   Acute metabolic encephalopathy   Hospital Course: [name] is a 58 y.o. female with medical history significant of asthma/COPD not on home oxygen, anxiety disorder, ADHD, depression was bibems due to SOB and being lethargic for few days now.  Has history of alcohol abuse, last drink day before presentation.  Patient admitted for further management for acute hypoxic respiratory failure likely due to asthma/COPD exacerbation, acute toxic encephalopathy likely due to alcohol use.  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 and patient was instructed, not to drive, operate heavy machinery, perform activities at heights, swimming or participation in water  activities or provide baby-sitting services while on Pain, Sleep and Anxiety Medications; until their outpatient Physician has advised to do so again. Also recommended to not to take more than prescribed Pain, Sleep and Anxiety Medications.  Consultants: Pulmonary Procedures performed: None  Disposition: Home Diet recommendation:  Discharge Diet Orders (From admission, onward)     Start     Ordered   09/07/24 0000  Diet - low sodium heart healthy        09/07/24 1353            DISCHARGE MEDICATION: Allergies as of 09/07/2024       Reactions   Hydrocodone  Other (See Comments)   Dizzy; Tolerates oxycodone  fine        Medication List     STOP taking these medications    chlordiazePOXIDE  25 MG capsule Commonly known as: LIBRIUM    doxycycline 100 MG tablet Commonly known as: VIBRA-TABS       TAKE these medications    albuterol  1.25 MG/3ML nebulizer solution Commonly known as: ACCUNEB  Take 1 ampule by nebulization 3 (three) times daily as needed for wheezing or shortness of breath.   albuterol  108 (90 Base) MCG/ACT inhaler Commonly known as: VENTOLIN  HFA Inhale 1-2 puffs into the  lungs every 6 (six) hours as needed for wheezing.   budesonide  0.5 MG/2ML nebulizer solution Commonly known as: PULMICORT  Inhale 0.5 mg into the lungs daily.   buPROPion  150 MG 24 hr tablet Commonly known as: Wellbutrin  XL Take 1 tablet (150 mg total) by mouth daily.   busPIRone  10 MG tablet Commonly known as: BUSPAR  Take  10 mg by mouth 2 (two) times daily.   Dupixent 300 MG/2ML [place] Generic drug: Dupilumab Inject 300 mg into the skin every 14 (fourteen) days.   FLUoxetine  20 MG capsule Commonly known as: PROZAC  Take 20 mg by mouth every morning.   fluticasone  50 MCG/ACT nasal spray Commonly known as: FLONASE  Place 2 sprays into both nostrils daily.   folic acid  1 MG tablet Commonly known as: FOLVITE  Take 1 mg by mouth daily.   guaiFENesin -dextromethorphan  100-10 MG/5ML syrup Commonly known as: ROBITUSSIN DM Take 5 mLs by mouth every 4 (four) hours as needed for cough.   hydrOXYzine  25 MG tablet Commonly known as: ATARAX  Take 1 tablet (25 mg total) by mouth every 6 (six) hours as needed (For anxiety or agitation).   lamoTRIgine  100 MG tablet Commonly known as: LAMICTAL  Take 50 mg by mouth 2 (two) times daily.   LORazepam  1 MG tablet Commonly known as: Ativan  Take 1 tablet (1 mg total) by mouth in the morning, at noon, and at bedtime for 1 day, THEN 1 tablet (1 mg total) 2 (two) times daily for 1 day, THEN 1 tablet (1 mg total) at bedtime for 1 day. Start taking on: September 07, 2024   methocarbamol  500 MG tablet Commonly known as: ROBAXIN  Take 2 tablets (1,000 mg total) by mouth every 8 (eight) hours as needed for muscle spasms.   nicotine  14 mg/24hr patch Commonly known as: NICODERM CQ  - dosed in mg/24 hours Place 1 patch (14 mg total) onto the skin daily as needed (For smoking cessation).   predniSONE  20 MG tablet Commonly known as: DELTASONE  Take 2 tablets (40 mg total) by mouth daily with breakfast for 2 days. Start taking on: September 08, 2024   progesterone  100 MG capsule Commonly known as: PROMETRIUM  Take 100-200 mg by mouth at bedtime.   SUMAtriptan  100 MG tablet Commonly known as: IMITREX  Take 1 tablet (100 mg total) by mouth every 2 (two) hours as needed for migraine. May repeat in 2 hours if headache persists or recurs. Do not exceed 2 tablets within a 24 hour period.    thiamine  100 MG tablet Commonly known as: Vitamin B-1 Take 1 tablet (100 mg total) by mouth daily.   Trelegy Ellipta 100-62.5-25 MCG/ACT Aepb Generic drug: Fluticasone -Umeclidin-Vilant Inhale 1 puff into the lungs daily.   triamcinolone  cream 0.5 % Commonly known as: KENALOG  Apply topically 2 (two) times daily.        Discharge Exam: There were no vitals filed for this visit.  General: Anxious, tremulous Cardiovascular: mild tachycardia, No murmurs heard Pulmonary: clear to auscultation b/l, improved significantly Abdominal: soft, non tender Neurological: AO x3, no gross focal deficits  Condition at discharge: good  The results of significant diagnostics from this hospitalization (including imaging, microbiology, ancillary and laboratory) are listed below for reference.   Imaging Studies: CT Angio Chest Pulmonary Embolism (PE) W or WO Contrast Result Date: 09/05/2024 EXAM: CTA CHEST 09/05/2024 03:52:05 AM TECHNIQUE: CTA of the chest was performed after the administration of intravenous contrast. Without and with IV contrast. 75mL iohexol  (OMNIPAQUE ) 350 MG/ML injection. Multiplanar reformatted images are provided for review. MIP  images are provided for review. Automated exposure control, iterative reconstruction, and/or weight based adjustment of the mA/kV was utilized to reduce the radiation dose to as low as reasonably achievable. COMPARISON: CT angiogram of the chest dated 11/26/2023. CLINICAL HISTORY: Pulmonary embolism (PE) suspected, low to intermediate prob, neg D-dimer. Pt arrived via EMS from home for breathing difficulty she states has been going on for for about a week. Hx of Asthma and COPD. EMS gave duoneb x2. Reports she fell a week ago. EMS reports she was [place] and lethargic when they arrived. FINDINGS: PULMONARY ARTERIES: Pulmonary arteries are adequately opacified for evaluation. No acute pulmonary embolus. Main pulmonary artery is normal in caliber. MEDIASTINUM: The  heart and pericardium demonstrate no acute abnormality. There is fusiform ectasia of the ascending thoracic aorta, which measures approximately 3.9 cm in diameter. LYMPH NODES: No mediastinal, hilar or axillary lymphadenopathy. LUNGS AND PLEURA: There are streaky peribronchovascular opacities present within the base of the right lower lobe. There is minimal atelectasis also present dependently within the left lower lobe. There is mild central lobular emphysema present within the lung apices. There is marked narrowing of the AP diameter of the tracheal lumen which narrows to as little as 3 mm. The mainstem bronchi are also mildly narrowed. There is mild bronchial wall thickening within the right lower lobe. No focal consolidation or pulmonary edema. No evidence of pleural effusion or pneumothorax. UPPER ABDOMEN: Limited images of the upper abdomen are unremarkable. SOFT TISSUES AND BONES: The patient is status post orthopedic fixation of the right humerus. No acute bone or soft tissue abnormality. IMPRESSION: 1. No evidence of pulmonary embolus. 2. Marked tracheal narrowing to approximately 3 mm AP diameter with mild mainstem bronchial narrowing, compatible with tracheobronchial malalacia. 3. Streaky peribronchovascular opacities in the right lower lobe and minimal dependent atelectasis in the left lower lobe. 4. Mild centrilobular emphysema in the lung apices. 5. 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 Electronically signed by: Evalene Coho MD 09/05/2024 04:17 AM EDT RP Workstation: [id number]   DG Chest Port 1 View Result Date: 09/05/2024 CLINICAL DATA:  Difficulty breathing EXAM: PORTABLE CHEST 1 VIEW COMPARISON:  08/30/2024 FINDINGS: Hardware in the right humerus. No acute airspace disease or effusion. Stable cardiomediastinal silhouette. No pneumothorax. Chronic right-sided rib fractures. IMPRESSION: No active disease. Electronically Signed   By: Luke Bun M.D.    On: 09/05/2024 01:26   DG Lumbar Spine 2-3 Views Result Date: 09/05/2024 CLINICAL DATA:  Pain after fall EXAM: LUMBAR SPINE - 2-3 VIEW COMPARISON:  CT 12/22/2021 FINDINGS: Mild dextrocurvature. Vertebral body heights are grossly maintained. The disc spaces are relatively patent. Lower lumbar facet degenerative changes. IMPRESSION: No definite acute osseous abnormality Electronically Signed   By: Luke Bun M.D.   On: 09/05/2024 01:25   DG Chest 2 View Result Date: 08/30/2024 CLINICAL DATA:  Initial evaluation for acute shortness of breath, history of COPD. EXAM: DG CHEST 2V COMPARISON:  Prior radiograph from 11/26/2023. FINDINGS: Transverse heart size is stable, and remains within normal limits. Mediastinal silhouette within normal limits. Lungs are hyperinflated with underlying emphysematous changes. No focal infiltrates. No pulmonary edema or pleural effusion. No pneumothorax. Osteopenia noted. Sequelae of prior ORIF noted at the proximal right humerus. Few remote left-sided rib fractures noted. Few chronic compression deformities noted about the thoracic spine, grossly stable. IMPRESSION: 1. No radiographic evidence for active cardiopulmonary disease. 2.  Emphysema (ICD10-J43.9). Electronically Signed   By: Morene Hoard M.D.   On: 08/30/2024  02:50   CT Head Wo Contrast Result Date: 08/22/2024 EXAM: CT HEAD, FACIAL BONES AND CERVICAL SPINE WITHOUT CONTRAST 08/22/2024 08:39:30 PM TECHNIQUE: CT of the head, facial bones and cervical spine was performed without the administration of intravenous contrast. Multiplanar reformatted images are provided for review. Automated exposure control, iterative reconstruction, and/or weight based adjustment of the mA/kV was utilized to reduce the radiation dose to as low as reasonably achievable. COMPARISON: None available. CLINICAL HISTORY: Head trauma, moderate-severe. Patient brought in by medic. Per medic patient is [facility] with bruising on  the right side of her head right buttock from a fall a few days ago from drinking per patient and wheezing. Patient c/o back and neck pain reported. Albuterol  duo ; neb mag and zofran  administered by [employer]. Hx COPD FINDINGS: CT HEAD BRAIN AND VENTRICLES: No acute intracranial hemorrhage. No mass effect or midline shift. No extra-axial fluid collection. No evidence of acute infarct. No hydrocephalus. SKULL AND SCALP: No acute skull fracture. No scalp hematoma. CT FACIAL BONES FACIAL BONES: No acute facial fracture. No mandibular dislocation. No suspicious bone lesion. ORBITS: No acute traumatic injury. SINUSES AND MASTOIDS: No acute abnormality. SOFT TISSUES: No acute abnormality. CT CERVICAL SPINE BONES AND ALIGNMENT: No acute fracture or traumatic malalignment. DEGENERATIVE CHANGES: Mild degenerative changes of the lower cervical spine, noting motion degradation. SOFT TISSUES: No prevertebral soft tissue swelling. IMPRESSION: 1. Motion degraded images. 2. No acute intracranial abnormality. 3. No traumatic injury of the cervical spine. 4. No acute fracture of the facial bones. Electronically signed by: Pinkie Pebbles MD 08/22/2024 08:47 PM EDT RP Workstation: [id number]   CT Cervical Spine Wo Contrast Result Date: 08/22/2024 EXAM: CT HEAD, FACIAL BONES AND CERVICAL SPINE WITHOUT CONTRAST 08/22/2024 08:39:30 PM TECHNIQUE: CT of the head, facial bones and cervical spine was performed without the administration of intravenous contrast. Multiplanar reformatted images are provided for review. Automated exposure control, iterative reconstruction, and/or weight based adjustment of the mA/kV was utilized to reduce the radiation dose to as low as reasonably achievable. COMPARISON: None available. CLINICAL HISTORY: Head trauma, moderate-severe. Patient brought in by medic. Per medic patient is [place] [facility] [place] with bruising on the right side of her head right buttock from a fall a few days ago from drinking per  patient and wheezing. Patient c/o back and neck pain reported. Albuterol  duo ; neb mag and zofran  administered by [employer]. Hx COPD FINDINGS: CT HEAD BRAIN AND VENTRICLES: No acute intracranial hemorrhage. No mass effect or midline shift. No extra-axial fluid collection. No evidence of acute infarct. No hydrocephalus. SKULL AND SCALP: No acute skull fracture. No scalp hematoma. CT FACIAL BONES FACIAL BONES: No acute facial fracture. No mandibular dislocation. No suspicious bone lesion. ORBITS: No acute traumatic injury. SINUSES AND MASTOIDS: No acute abnormality. SOFT TISSUES: No acute abnormality. CT CERVICAL SPINE BONES AND ALIGNMENT: No acute fracture or traumatic malalignment. DEGENERATIVE CHANGES: Mild degenerative changes of the lower cervical spine, noting motion degradation. SOFT TISSUES: No prevertebral soft tissue swelling. IMPRESSION: 1. Motion degraded images. 2. No acute intracranial abnormality. 3. No traumatic injury of the cervical spine. 4. No acute fracture of the facial bones. Electronically signed by: Pinkie Pebbles MD 08/22/2024 08:47 PM EDT RP Workstation: [id number]   CT Maxillofacial Wo Contrast Result Date: 08/22/2024 EXAM: CT HEAD, FACIAL BONES AND CERVICAL SPINE WITHOUT CONTRAST 08/22/2024 08:39:30 PM TECHNIQUE: CT of the head, facial bones and cervical spine was performed without the administration of intravenous contrast. Multiplanar reformatted images are provided for  review. Automated exposure control, iterative reconstruction, and/or weight based adjustment of the mA/kV was utilized to reduce the radiation dose to as low as reasonably achievable. COMPARISON: None available. CLINICAL HISTORY: Head trauma, moderate-severe. Patient brought in by medic. Per medic patient is [place] with bruising on the right side of her head right buttock from a fall a few days ago from drinking per patient and wheezing. Patient c/o back and neck pain reported. Albuterol  duo ; neb mag and  zofran  administered by [employer]. Hx COPD FINDINGS: CT HEAD BRAIN AND VENTRICLES: No acute intracranial hemorrhage. No mass effect or midline shift. No extra-axial fluid collection. No evidence of acute infarct. No hydrocephalus. SKULL AND SCALP: No acute skull fracture. No scalp hematoma. CT FACIAL BONES FACIAL BONES: No acute facial fracture. No mandibular dislocation. No suspicious bone lesion. ORBITS: No acute traumatic injury. SINUSES AND MASTOIDS: No acute abnormality. SOFT TISSUES: No acute abnormality. CT CERVICAL SPINE BONES AND ALIGNMENT: No acute fracture or traumatic malalignment. DEGENERATIVE CHANGES: Mild degenerative changes of the lower cervical spine, noting motion degradation. SOFT TISSUES: No prevertebral soft tissue swelling. IMPRESSION: 1. Motion degraded images. 2. No acute intracranial abnormality. 3. No traumatic injury of the cervical spine. 4. No acute fracture of the facial bones. Electronically signed by: Pinkie Pebbles MD 08/22/2024 08:47 PM EDT RP Workstation: [id number]    Microbiology: Results for orders placed or performed during the hospital encounter of 09/05/24  Resp panel by RT-PCR (RSV, Flu A&B, Covid) Anterior Nasal Swab     Status: None   Collection Time: 09/05/24 12:24 AM   Specimen: Anterior Nasal Swab  Result Value Ref Range Status   SARS Coronavirus 2 by RT PCR NEGATIVE NEGATIVE Final    Comment: (NOTE) SARS-CoV-2 target nucleic acids are NOT DETECTED.  The SARS-CoV-2 RNA is generally detectable in upper respiratory specimens during the acute phase of infection. The lowest concentration of SARS-CoV-2 viral copies this assay can detect is 138 copies/mL. A negative result does not preclude SARS-Cov-2 infection and should not be used as the sole basis for treatment or other patient management decisions. A negative result may occur with  improper specimen collection/handling, submission of specimen other than nasopharyngeal swab, presence of viral mutation(s)  within the areas targeted by this assay, and inadequate number of viral copies(<138 copies/mL). A negative result must be combined with clinical observations, patient history, and epidemiological information. The expected result is Negative.  Fact Sheet for Patients:  BloggerCourse.com  Fact Sheet for Healthcare Providers:  SeriousBroker.it  This test is no t yet approved or cleared by the [place]  FDA and  has been authorized for detection and/or diagnosis of SARS-CoV-2 by FDA under an Emergency Use Authorization (EUA). This EUA will remain  in effect (meaning this test can be used) for the duration of the COVID-19 declaration under Section 564(b)(1) of the Act, 21 U.S.C.section 360bbb-3(b)(1), unless the authorization is terminated  or revoked sooner.       Influenza A by PCR NEGATIVE NEGATIVE Final   Influenza B by PCR NEGATIVE NEGATIVE Final    Comment: (NOTE) The Xpert Xpress SARS-CoV-2/FLU/RSV plus assay is intended as an aid in the diagnosis of influenza from Nasopharyngeal swab specimens and should not be used as a sole basis for treatment. Nasal washings and aspirates are unacceptable for Xpert Xpress SARS-CoV-2/FLU/RSV testing.  Fact Sheet for Patients: BloggerCourse.com  Fact Sheet for Healthcare Providers: SeriousBroker.it  This test is not yet approved or cleared by the [place]  FDA and  has been authorized for detection and/or diagnosis of SARS-CoV-2 by FDA under an Emergency Use Authorization (EUA). This EUA will remain in effect (meaning this test can be used) for the duration of the COVID-19 declaration under Section 564(b)(1) of the Act, 21 U.S.C. section 360bbb-3(b)(1), unless the authorization is terminated or revoked.     Resp Syncytial Virus by PCR NEGATIVE NEGATIVE Final    Comment: (NOTE) Fact Sheet for  Patients: BloggerCourse.com  Fact Sheet for Healthcare Providers: SeriousBroker.it  This test is not yet approved or cleared by the [place]  FDA and has been authorized for detection and/or diagnosis of SARS-CoV-2 by FDA under an Emergency Use Authorization (EUA). This EUA will remain in effect (meaning this test can be used) for the duration of the COVID-19 declaration under Section 564(b)(1) of the Act, 21 U.S.C. section 360bbb-3(b)(1), unless the authorization is terminated or revoked.  Performed at [facility], [address] [place], [place] [address]     Labs: CBC: Recent Labs  Lab 09/05/24 0023 09/05/24 0216 09/05/24 0423  WBC 10.2 6.7 6.0  NEUTROABS 3.6  --   --   HGB 14.8 13.5 13.7  HCT 46.1* 41.2 42.3  MCV 98.1 96.7 97.7  PLT 203 160 160   Basic Metabolic Panel: Recent Labs  Lab 09/05/24 0023 09/05/24 0423 09/06/24 0456  NA 142 141 138  K 4.1 3.7 4.9  CL 101 102 102  CO2 27 25 29   GLUCOSE 95 119* 120*  BUN 14 15 20   CREATININE 0.73 0.62 0.61  CALCIUM  8.2* 7.6* 8.6*  MG  --  2.3 2.6*  PHOS  --  4.6 3.1   Liver Function Tests: Recent Labs  Lab 09/05/24 0023  AST 38  ALT 32  ALKPHOS 118  BILITOT 0.3  PROT 6.5  ALBUMIN 3.8   CBG: No results for input(s): GLUCAP in the last 168 hours.  Discharge time spent: greater than 30 minutes.  Signed: Laree Lock, MD [facility] 09/07/2024

## 2024-09-07 NOTE — Progress Notes (Signed)
 PULMONOLOGY         Date: 09/07/2024,   MRN# [id number] [name] 1966/04/04     Admission                  Current   Referring provider: Dr Jerrilyn   CHIEF COMPLAINT:   Acute hypoxemic respiratory failure   HISTORY OF PRESENT ILLNESS    [name] is a 58 y.o. female with medical history significant of Asth and COPD overlpa syndrome she has been on biologic therapy with dupulimab and her insurance stopped covering it so she ended up with worsening dyspena and cough since May 2025.  She admits to starting to smoke again and this made things much worse and ended up with severe respiratory symptoms and cough. She was brought in by EMS due to hypoxemia.  She denies having sick contacts. She previously had alcoholism and recently relapsed.  She endorses stress due to school.  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    09/06/24- patient is imporved now off supplemental oxygen.  She is rhonchorous and is wheezing bilaterally but markedly improved compared to initial presentation.  She has worked with PT/OT.  She can be dcd home in AM tommorow.  Please continue current plan she's recovering well.   09/07/24- patient seen at bedside. She is on room air, no longer wheezing.  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   Sleep apnea    mild   SUI (stress urinary  incontinence, female)      SURGICAL HISTORY   Past Surgical History:  Procedure Laterality Date   BLADDER SUSPENSION N/A 02/13/2013   Procedure: [facility] SLING;  Surgeon: Norleen JINNY Seltzer, MD;  [facility];  Service: Urology;  Laterality: N/A;   CESAREAN SECTION  12-23-2004    W/ LEFT TUBAL LIGATION AND RIGHT SALPINGECTOMY   CESAREAN SECTION  1995   CYSTOSCOPY WITH STENT PLACEMENT Bilateral 06/16/2020   Procedure: CYSTOSCOPY WITH FIREFLY INJECTION;  Surgeon: Carolee Sherwood JONETTA DOUGLAS, MD;  Location: [facility];  Service: Urology;  Laterality: Bilateral;   IR RADIOLOGIST EVAL & MGMT  04/21/2020   IR RADIOLOGIST EVAL & MGMT  05/12/2020   ORIF HUMERUS FRACTURE Right 04/28/2014   Procedure: OPEN REDUCTION INTERNAL FIXATION (ORIF) PROXIMAL HUMERUS FRACTURE;  Surgeon: Eva Elsie Herring, MD;  Location: [facility];  Service: Orthopedics;  Laterality: Right;   ORIF PROXIMAL HUMERUS FRACTURE Right 04/28/2014   DR CHANDLER   PROCTOSCOPY N/A 06/16/2020   Procedure: RIGID PROCTOSCOPY;  Surgeon: Sheldon Standing, MD;  Location: [facility];  Service: General;  Laterality: N/A;   TUBAL LIGATION       FAMILY HISTORY   Family History  Adopted: Yes     SOCIAL HISTORY   Social History   Tobacco Use  Smoking status: Every Day    Current packs/day: 1.00    Average packs/day: 0.4 packs/day for 23.8 years (8.8 ttl pk-yrs)    Types: Cigarettes    Start date: 09/24/1999    Last attempt to quit: 09/24/2019   Smokeless tobacco: Never  Vaping Use   Vaping status: Every Day   Substances: Nicotine   Substance Use Topics   Alcohol use: Yes    Alcohol/week: 2.0 standard drinks of alcohol    Types: 2 Glasses of wine per week    Comment: 12/29/20 restarted    Drug use: No     MEDICATIONS     Current Medication:  Current Facility-Administered Medications:    acetaminophen  (TYLENOL ) tablet 650 mg, 650 mg, Oral, Q6H PRN, Dorinda Drue DASEN, MD, 650 mg at 09/06/24 1429    budesonide -glycopyrrolate -formoterol  (BREZTRI ) 160-9-4.8 MCG/ACT inhaler 2 puff, 2 puff, Inhalation, BID, Dorinda Drue DASEN, MD, 2 puff at 09/07/24 0844   buPROPion  (WELLBUTRIN  XL) 24 hr tablet 150 mg, 150 mg, Oral, Daily, Dorinda Drue T, MD, 150 mg at 09/07/24 0844   busPIRone  (BUSPAR ) tablet 10 mg, 10 mg, Oral, BID, Djan, Prince T, MD, 10 mg at 09/07/24 0844   enoxaparin  (LOVENOX ) injection 40 mg, 40 mg, Subcutaneous, Q24H, Djan, Drue DASEN, MD, 40 mg at 09/06/24 9187   FLUoxetine  (PROZAC ) capsule 20 mg, 20 mg, Oral, q morning, Djan, Drue DASEN, MD, 20 mg at 09/07/24 9156   folic acid  (FOLVITE ) tablet 1 mg, 1 mg, Oral, Daily, Djan, Drue DASEN, MD, 1 mg at 09/07/24 9156   guaiFENesin -dextromethorphan  (ROBITUSSIN DM) 100-10 MG/5ML syrup 5 mL, 5 mL, Oral, Q4H PRN, Ponnala, Shruthi, MD, 5 mL at 09/06/24 1438   lamoTRIgine  (LAMICTAL ) tablet 50 mg, 50 mg, Oral, BID, Ponnala, Shruthi, MD, 50 mg at 09/07/24 9156   levalbuterol  (XOPENEX ) nebulizer solution 0.63 mg, 0.63 mg, Nebulization, Q8H, Osmar Howton, MD, 0.63 mg at 09/07/24 1309   LORazepam  (ATIVAN ) tablet 1-4 mg, 1-4 mg, Oral, Q1H PRN, 2 mg at 09/07/24 1020 **OR** LORazepam  (ATIVAN ) injection 1-4 mg, 1-4 mg, Intravenous, Q1H PRN, Dorinda Drue DASEN, MD, 2 mg at 09/05/24 1301   [EXPIRED] LORazepam  (ATIVAN ) tablet 0-4 mg, 0-4 mg, Oral, Q4H, 2 mg at 09/07/24 0127 **FOLLOWED BY** LORazepam  (ATIVAN ) tablet 0-4 mg, 0-4 mg, Oral, Q8H, Ponnala, Shruthi, MD, 2 mg at 09/07/24 0843   multivitamin with minerals tablet 1 tablet, 1 tablet, Oral, Daily, Djan, Drue DASEN, MD, 1 tablet at 09/07/24 9156   nicotine  (NICODERM CQ  - dosed in mg/24 hours) patch 21 mg, 21 mg, Transdermal, Daily, Ponnala, Shruthi, MD, 21 mg at 09/07/24 9156   nicotine  polacrilex (NICORETTE ) gum 2 mg, 2 mg, Oral, PRN, Ponnala, Shruthi, MD, 2 mg at 09/06/24 1952   predniSONE  (DELTASONE ) tablet 40 mg, 40 mg, Oral, Q breakfast, Djan, Prince T, MD, 40 mg at 09/07/24 9156   thiamine  (VITAMIN B1) tablet 100 mg,  100 mg, Oral, Daily, Djan, Prince T, MD, 100 mg at 09/07/24 0843    ALLERGIES   Hydrocodone      REVIEW OF SYSTEMS    Review of Systems:  Gen:  Denies  fever, sweats, chills weigh loss  HEENT: Denies blurred vision, double vision, ear pain, eye pain, hearing loss, nose bleeds, sore throat Cardiac:  No dizziness, chest pain or heaviness, chest tightness,edema Resp:   reports dyspnea chronically  Gi: Denies swallowing difficulty, stomach pain, nausea or vomiting, diarrhea, constipation, bowel incontinence Gu:  Denies bladder incontinence, burning urine Ext:   Denies Joint pain, stiffness  or swelling Skin: Denies  skin rash, easy bruising or bleeding or hives Endoc:  Denies polyuria, polydipsia , polyphagia or weight change Psych:   Denies depression, insomnia or hallucinations   Other:  All other systems negative   VS: BP 118/85 (BP Location: Right Arm)   Pulse 89   Temp 98.4 F (36.9 C) (Oral)   Resp 16   SpO2 96%      PHYSICAL EXAM    GENERAL:NAD, no fevers, chills, no weakness no fatigue HEAD: Normocephalic, atraumatic.  EYES: Pupils equal, round, reactive to light. Extraocular muscles intact. No scleral icterus.  MOUTH: Moist mucosal membrane. Dentition intact. No abscess noted.  EAR, NOSE, THROAT: Clear without exudates. No external lesions.  NECK: Supple. No thyromegaly. No nodules. No JVD.  PULMONARY: decreased breath sounds with mild rhonchi worse at bases bilaterally.  CARDIOVASCULAR: S1 and S2. Regular rate and rhythm. No murmurs, rubs, or gallops. No edema. Pedal pulses 2+ bilaterally.  GASTROINTESTINAL: Soft, nontender, nondistended. No masses. Positive bowel sounds. No hepatosplenomegaly.  MUSCULOSKELETAL: No swelling, clubbing, or edema. Range of motion full in all extremities.  NEUROLOGIC: Cranial nerves II through XII are intact. No gross focal neurological deficits. Sensation intact. Reflexes intact.  SKIN: No ulceration, lesions, rashes, or  cyanosis. Skin warm and dry. Turgor intact.  PSYCHIATRIC: Mood, affect within normal limits. The patient is awake, alert and oriented x 3. Insight, judgment intact.       IMAGING     ASSESSMENT/PLAN   Acute hypoxemic respiratory failure    - Patient with Severe persistent asthma COPD overlap (ACOS)    -She is very sensitive to bronchodilators with Sinus tachycardia, I have ordered xopenex .  At home she also uses ensifentrine  BID via nebulizer and budesonide  via nebulizer     -CRP     -PCR for COVID/FLU/RSV is negative     - RVP is in process    - She received IV solumedrol 125mg  and is now on prednisone     - recommendation for PT/OT and DC planning   -patient needs to resume dupulimab q 2wk IM injection post discharge   Tracheobronchomalacia     - can work on this on outpatient as this may require surgical intervention via thoracic surgery   Alcoholism and Tobacco dependence     - nicotine  replacement      - she requests gum     -on CIWA         Thank you for allowing me to participate in the care of this patient.   Patient/Family are satisfied with care plan and all questions have been answered.    Provider disclosure: Patient with at least one acute or chronic illness or injury that poses a threat to life or bodily function and is being managed actively during this encounter.  All of the below services have been performed independently by signing provider:  review of prior documentation from internal and or external health records.  Review of previous and current lab results.  Interview and comprehensive assessment during patient visit today. Review of current and previous chest radiographs/CT scans. Discussion of management and test interpretation with health care team and patient/family.   This document was prepared using Dragon voice recognition software and may include unintentional dictation errors.     Ceferino Lang, M.D.  Division of Pulmonary & Critical  Care Medicine

## 2024-09-07 NOTE — Plan of Care (Signed)

## 2024-09-10 ENCOUNTER — Emergency Department

## 2024-09-10 ENCOUNTER — Other Ambulatory Visit: Payer: Self-pay

## 2024-09-10 ENCOUNTER — Emergency Department
Admission: EM | Admit: 2024-09-10 | Discharge: 2024-09-10 | Disposition: A | Discharge status: Home / Self Care | Attending: Emergency Medicine | Admitting: Emergency Medicine

## 2024-09-10 DIAGNOSIS — J45901 Unspecified asthma with (acute) exacerbation: Secondary | ICD-10-CM | POA: Insufficient documentation

## 2024-09-10 DIAGNOSIS — D72829 Elevated white blood cell count, unspecified: Secondary | ICD-10-CM | POA: Insufficient documentation

## 2024-09-10 DIAGNOSIS — R0602 Shortness of breath: Secondary | ICD-10-CM | POA: Diagnosis present

## 2024-09-10 LAB — BASIC METABOLIC PANEL WITH GFR
Anion gap: 11 (ref 5–15)
BUN: 12 mg/dL (ref 6–20)
CO2: 26 mmol/L (ref 22–32)
Calcium: 8.4 mg/dL — ABNORMAL LOW (ref 8.9–10.3)
Chloride: 103 mmol/L (ref 98–111)
Creatinine, Ser: 0.85 mg/dL (ref 0.44–1.00)
GFR, Estimated: >60 mL/min (ref >60)
Glucose, Bld: 80 mg/dL (ref 70–99)
Potassium: 3.5 mmol/L (ref 3.5–5.1)
Sodium: 140 mmol/L (ref 135–145)

## 2024-09-10 LAB — CBC WITH DIFFERENTIAL/PLATELET
Abs Immature Granulocytes: 0.79 K/uL — ABNORMAL HIGH (ref 0.00–0.07)
Basophils Absolute: 0.1 K/uL (ref 0.0–0.1)
Basophils Relative: 1 %
Eosinophils Absolute: 0.3 K/uL (ref 0.0–0.5)
Eosinophils Relative: 2 %
HCT: 43 % (ref 36.0–46.0)
Hemoglobin: 13.9 g/dL (ref 12.0–15.0)
Immature Granulocytes: 5 %
Lymphocytes Relative: 48 %
Lymphs Abs: 7.4 K/uL — ABNORMAL HIGH (ref 0.7–4.0)
MCH: 32 pg (ref 26.0–34.0)
MCHC: 32.3 g/dL (ref 30.0–36.0)
MCV: 99.1 fL (ref 80.0–100.0)
Monocytes Absolute: 1.1 K/uL — ABNORMAL HIGH (ref 0.1–1.0)
Monocytes Relative: 7 %
Neutro Abs: 5.7 K/uL (ref 1.7–7.7)
Neutrophils Relative %: 37 %
Platelets: 180 K/uL (ref 150–400)
RBC: 4.34 MIL/uL (ref 3.87–5.11)
RDW: 14.2 % (ref 11.5–15.5)
Smear Review: NORMAL
WBC: 15.4 K/uL — ABNORMAL HIGH (ref 4.0–10.5)
nRBC: 0.3 % — ABNORMAL HIGH (ref 0.0–0.2)

## 2024-09-10 LAB — BLOOD GAS, VENOUS
Acid-Base Excess: 3.2 mmol/L — ABNORMAL HIGH (ref 0.0–2.0)
Bicarbonate: 31.2 mmol/L — ABNORMAL HIGH (ref 20.0–28.0)
O2 Saturation: 52.3 %
Patient temperature: 37
pCO2, Ven: 62 mmHg — ABNORMAL HIGH (ref 44–60)
pH, Ven: 7.31 (ref 7.25–7.43)
pO2, Ven: 35 mmHg (ref 32–45)

## 2024-09-10 LAB — TROPONIN I (HIGH SENSITIVITY): Troponin I (High Sensitivity): 5 ng/L (ref <18)

## 2024-09-10 LAB — RESP PANEL BY RT-PCR (RSV, FLU A&B, COVID)  RVPGX2
Influenza A by PCR: NEGATIVE
Influenza B by PCR: NEGATIVE
Resp Syncytial Virus by PCR: NEGATIVE
SARS Coronavirus 2 by RT PCR: NEGATIVE

## 2024-09-10 LAB — PATHOLOGIST SMEAR REVIEW

## 2024-09-10 MED ORDER — IPRATROPIUM-ALBUTEROL 0.5-2.5 (3) MG/3ML IN SOLN
3.0000 mL | Freq: Once | RESPIRATORY_TRACT | Status: AC
  Administered 2024-09-10: 3 mL via RESPIRATORY_TRACT
  Filled 2024-09-10: qty 6

## 2024-09-10 MED ORDER — PREDNISONE 10 MG (21) PO TBPK: ORAL_TABLET | ORAL | 0 refills | Status: AC

## 2024-09-10 MED ORDER — IPRATROPIUM-ALBUTEROL 0.5-2.5 (3) MG/3ML IN SOLN
3.0000 mL | Freq: Once | RESPIRATORY_TRACT | Status: AC
  Administered 2024-09-10: 3 mL via RESPIRATORY_TRACT

## 2024-09-10 NOTE — ED Provider Notes (Signed)
 [facility] Provider Note    Event Date/Time   First MD Initiated Contact with Patient 09/10/24 0109     (approximate)   History   Shortness of Breath   HPI {Remember to add pertinent medical, surgical, social, and/or OB history to HPI:1} [name] is a 58 y.o. female  ***       Physical Exam   Triage Vital Signs: ED Triage Vitals  Encounter Vitals Group     BP 09/10/24 0105 114/62     Girls Systolic BP Percentile --      Girls Diastolic BP Percentile --      Boys Systolic BP Percentile --      Boys Diastolic BP Percentile --      Pulse Rate 09/10/24 0105 (!) 105     Resp 09/10/24 0105 20     Temp 09/10/24 0105 97.7 F (36.5 C)     Temp Source 09/10/24 0105 Oral     SpO2 09/10/24 0105 94 %     Weight 09/10/24 0107 143 lb 3.2 oz (65 kg)     Height 09/10/24 0107 5' 5 (1.651 m)     Head Circumference --      Peak Flow --      Pain Score 09/10/24 0107 0     Pain Loc --      Pain Education --      Exclude from Growth Chart --     Most recent vital signs: Vitals:   09/10/24 0105  BP: 114/62  Pulse: (!) 105  Resp: 20  Temp: 97.7 F (36.5 C)  SpO2: 94%    {Only need to document appropriate and relevant physical exam:1} General: Awake, no distress. *** CV:  Good peripheral perfusion. *** Resp:  Normal effort. *** Abd:  No distention. *** Other:  ***   ED Results / Procedures / Treatments   Labs (all labs ordered are listed, but only abnormal results are displayed) Labs Reviewed  BLOOD GAS, VENOUS - Abnormal; Notable for the following components:      Result Value   pCO2, Ven 62 (*)    Bicarbonate 31.2 (*)    Acid-Base Excess 3.2 (*)    All other components within normal limits  CBC WITH DIFFERENTIAL/PLATELET - Abnormal; Notable for the following components:   WBC 15.4 (*)    nRBC 0.3 (*)    All other components within normal limits  BASIC METABOLIC PANEL WITH GFR  TROPONIN I (HIGH SENSITIVITY)     EKG  I,  Guadalupe Eagles, attending physician, personally viewed and interpreted this EKG  EKG Time: 0100 Rate: 110 Rhythm: sinus tachycardia Axis: normal Intervals: qtc 435 QRS: narrow ST changes: no st elevation Impression: abnormal ekg   RADIOLOGY *** {USE THE WORD INTERPRETED!! You MUST document your own interpretation of imaging, as well as the fact that you reviewed the radiologist's report!:1}   PROCEDURES:  Critical Care performed: Yes  CRITICAL CARE Performed by: Guadalupe Eagles   Total critical care time: *** minutes  Critical care time was exclusive of separately billable procedures and treating other patients.  Critical care was necessary to treat or prevent imminent or life-threatening deterioration.  Critical care was time spent personally by me on the following activities: development of treatment plan with patient and/or surrogate as well as nursing, discussions with consultants, evaluation of patient's response to treatment, examination of patient, obtaining history from patient or surrogate, ordering and performing treatments and interventions, ordering and review  of laboratory studies, ordering and review of radiographic studies, pulse oximetry and re-evaluation of patient's condition.   Procedures    MEDICATIONS ORDERED IN ED: Medications - No data to display   IMPRESSION / MDM / ASSESSMENT AND PLAN / ED COURSE  I reviewed the triage vital signs and the nursing notes.                              Differential diagnosis includes, but is not limited to, ***  Patient's presentation is most consistent with {EM COPA:27473}   ***The patient is on the cardiac monitor to evaluate for evidence of arrhythmia and/or significant heart rate changes.  ***      FINAL CLINICAL IMPRESSION(S) / ED DIAGNOSES   Final diagnoses:  None        Rx / DC Orders   ED Discharge Orders     None        Note:  This document was prepared using Dragon voice  recognition software and may include unintentional dictation errors.

## 2024-09-10 NOTE — ED Notes (Signed)
 Nasal cannula removed from pt. Respirations even and unlabored with normal work of breathing. Pt states sx improved.

## 2024-09-10 NOTE — ED Notes (Signed)
 While ambulating, SpO2 maintained at 96% on room air. Pt denies feeling short of breath.

## 2024-09-10 NOTE — ED Notes (Signed)
 Called lab to confirm CBC, BMP, Troponin tubes arrived. Lab confirmed they did

## 2024-09-10 NOTE — ED Triage Notes (Signed)
 Pt arrived via EMS from home for breathing difficulty she states has been going on for about 3 week. Hx of Asthma and COPD.  EMS gave duoneb x2 125 mg Solumedrol Epi 0.3 mg IM  EMS vitals: SPO2 98% on RA HR 95 BP 132/78

## 2024-09-30 ENCOUNTER — Other Ambulatory Visit: Payer: Self-pay | Admitting: Pulmonary Disease

## 2024-09-30 ENCOUNTER — Encounter: Payer: Self-pay | Admitting: Pulmonary Disease

## 2024-09-30 DIAGNOSIS — Z87891 Personal history of nicotine dependence: Secondary | ICD-10-CM

## 2024-11-25 ENCOUNTER — Telehealth (HOSPITAL_COMMUNITY): Payer: Self-pay | Admitting: Licensed Clinical Social Worker

## 2024-11-25 NOTE — Telephone Encounter (Signed)
 The therapist receives a call from Ms. Therisa Bohr with [facility] saying that this patient is stepping down from their PHP and wants SA IOP. She is scheduled for an assessment on 12/01/24 as she discharges on 11/28/24.   Zell Maier, MA, LCSW, [facility], LCAS 11/25/2024

## 2024-11-27 ENCOUNTER — Other Ambulatory Visit: Payer: Self-pay | Admitting: Adult Health

## 2024-11-27 DIAGNOSIS — F331 Major depressive disorder, recurrent, moderate: Secondary | ICD-10-CM

## 2024-11-27 DIAGNOSIS — F411 Generalized anxiety disorder: Secondary | ICD-10-CM

## 2024-12-01 ENCOUNTER — Ambulatory Visit (INDEPENDENT_AMBULATORY_CARE_PROVIDER_SITE_OTHER): Admitting: Licensed Clinical Social Worker

## 2024-12-01 VITALS — BP 130/78 | HR 100 | Temp 97.9°F | Ht 65.0 in | Wt 135.6 lb

## 2024-12-01 DIAGNOSIS — F1020 Alcohol dependence, uncomplicated: Secondary | ICD-10-CM | POA: Diagnosis not present

## 2024-12-01 DIAGNOSIS — F902 Attention-deficit hyperactivity disorder, combined type: Secondary | ICD-10-CM

## 2024-12-01 DIAGNOSIS — F39 Unspecified mood [affective] disorder: Secondary | ICD-10-CM

## 2024-12-01 DIAGNOSIS — F17200 Nicotine dependence, unspecified, uncomplicated: Secondary | ICD-10-CM

## 2024-12-01 NOTE — Progress Notes (Signed)
 THERAPIST PROGRESS NOTE/Addendum to CCA  Session Time: 11 a.m. to 11:30 a.m.   Type of Therapy: Individual   Therapist Response/Interventions: Solution Focused/The therapist assists [name] in overcoming her concerns about insurance lapsing in 2 days by helping her to get signed up for Medicaid today.  Additionally, the therapist suggests that given her COPD and Asthma that her tobacco and vaping needs to be an additional treatment target in addition to her alcohol use.   Treatment Goals addressed:  Active     Substance Use     [name] will report complete abstinence from drugs and alcohol per self report, and weekly UDS while also attending 12 step meetings, obtaining a sponsor and beginning step work per self report. (Initial)     Start:  10/29/23    Expected End:  03/01/25         [name] will decrease depressive and anxiety symptoms by reporting PHQ-( and GAD-7 of scores no higher than 3. (Initial)     Start:  10/29/23    Expected End:  03/01/25         Therapist will educate [name] about SUDS, patterns and consequences of use, relapse risks, the treatment process, and types of mutual groups and relapse prevention skills     Start:  10/29/23      [name] gives this therapist verbal permission to electronically sign the Care Plan      Therapist will assist [name] in identifying thoughts and behaviors that can contribute to feelings of depression and anxiety.     Start:  10/29/23      [name] gives this therapist permission to electronically sign the Care Plan         Summary: [name] presents today having recently discharged from [name] this past Friday, December 26.  She wants to start the SA IOP at [name].  She had a comprehensive clinical assessment done by [name] at the time of her admission on October 14 of this year this will be scanned into epic.  Previously, she had a comprehensive clinical assessment done on October 23, 2023 when she  presented to the [name] for inpatient admission for detox.  [name] that she was in [name] in March of this year as well having completed their inpatient, 28-day residential program after which she stepped down to their PHP.  Upon completing this program, she Kline that she was sober for 6 months.  She believes that her 47-month sobriety date was around September 8th but Kline that she had relapsed by September 19 and was drinking daily.  This daily drinking eventually resulted in her experiencing terrible anxiety which she believes were in actuality alcohol withdrawal.  She Kline that it was so bad that she was waking up to drink in the middle of the night.  Around the same timeframe, she fell while intoxicated hurting her back.  It still hurts intermittently but she indicates that it is getting better and that she is prescribed Robaxin  for the pain.  She was recently started on Strattera saying that she was diagnosed with ADHD in the third grade and that her behavior was so hyperactive that school contacted her mom suggesting that [name] would not be able to stay in school without treatment for this condition.  In addition to the Robaxin  and Strattera, she is also taking Lamictal , Wellbutrin , BuSpar , and Prozac .  [name] at the [facility] deviously diagnosed [name] with bipolar disorder and had her taking Latuda and  lithium  over a period of 2 years; however, while on the medication, she could barely get out of bed and Kline that she felt no emotions whatsoever.  Eventually, his medication was discontinued.  She Kline that the Lamictal  is to help reduce feelings of irritability.  [name] Kline that she did the SA IOP at [name] twice but did not find it helpful as it did not have a lot of information on relapse prevention.  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  Presently, [name] describes her mood as being euthymic for the most part.  She Kline that she had some strong cravings a few weeks ago manage not to drink.  She Kline that coming to intensive outpatient helps to hold her accountable as she knows that she will be tested.  [name] lives alone in a house which she rents.  [name] Kline that her mom, her best friend, and her 30 year old daughter are all supports and that none of them use substances.  [name] attends AA meetings 6 out of 7 days/week and has the same sponsor she is had since March of this year but is yet to start working any steps.  In the past, when she attempted to work steps, she would make it to step four an and up relapsing.  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  Also, [name] was having some sort of difficulty with this patient's husband.  [name] was adopted at only a few weeks old and Kline that her biological maternal grandfather was an alcoholic and that her biological father's mother reportedly had nerve problems.  Other than alcohol, [name] smokes about 1/2 pack of cigarettes per day but also vapes nicotine  adding that she vapes more than she smokes.  She needs to quit both of these behaviors as she has COPD and asthma.  [name] has tried to figure out what it is that causes her to repeatedly relapse and has concluded that it comes down to dating relationships with men, school, and her job.  When she presents today, she is uncertain that she will be able to attend the program as she loses her [employer] insurance at the end of this year.  The insurance she was trying to get to replace [name] was out of network for [employer].  The other insurance she looked into getting was too expensive and offered very little coverage.  The therapist arranges for [name] to meet with a Medicaid review specialist today and she is very  happy to find out that she qualifies for Medicaid which she indicates is a great relief and that she can attend the SA IOP and does not have to worry about trying to pay for insurance that she really cannot afford.  Bethsaida meets admission criteria for the SA IOP level of Care. In dimension 1, she has manageable withdrawal in the form of post-acute withdrawal. She has COPD and Asthma as well as some intermittent minor back pain but her medication conditions or not a distraction from treatment. Presently, her PHQ-9 is a 1 and GAD-7 as 2 so thus far her depression and anxiety are well-controlled on her psychotropic medications. In dimension 3, she is open to recovery but needs a structured environment to maintain therapeutic gains. In dimension 4, she  has little awareness of relapse prevention skills necessary to maintain consistent sobriety. Her recovery sobriety is supportive.     Progress Towards Goals: Initial  Suicidal/Homicidal: No SI or HI  Plan: Return on 12/03/24 to start SA IOP  Diagnosis: Alcohol use disorder, severe; unspecified affective disorder (rule out major depression versus bipolar 2 disorder;) ADHD, Combined Type per history, and Tobacco use Disorder  Collaboration of Care: Other The therapist reviews Melina's CCA from [facility] with her to confirm that all information is accurate.   Patient/Guardian was advised Release of Information must be obtained prior to any record release in order to collaborate their care with an outside provider. Patient/Guardian was advised if they have not already done so to contact the registration department to sign all necessary forms in order for us  to release information regarding their care.   Consent: Patient/Guardian gives verbal consent for treatment and assignment of benefits for services provided during this visit. Patient/Guardian expressed understanding and agreed to proceed.   Zell Maier, MA, LCSW, [facility], LCAS 12/01/2024

## 2024-12-03 ENCOUNTER — Ambulatory Visit (INDEPENDENT_AMBULATORY_CARE_PROVIDER_SITE_OTHER): Admitting: Licensed Clinical Social Worker

## 2024-12-03 DIAGNOSIS — F1020 Alcohol dependence, uncomplicated: Secondary | ICD-10-CM | POA: Diagnosis not present

## 2024-12-03 DIAGNOSIS — F902 Attention-deficit hyperactivity disorder, combined type: Secondary | ICD-10-CM

## 2024-12-03 DIAGNOSIS — F39 Unspecified mood [affective] disorder: Secondary | ICD-10-CM

## 2024-12-03 DIAGNOSIS — F17200 Nicotine dependence, unspecified, uncomplicated: Secondary | ICD-10-CM

## 2024-12-03 NOTE — Progress Notes (Signed)
 Daily Group Progress Note   Program: CD IOP     Group Time: 9 a.m. to 12 p.m.      Type of Therapy: Process and Psychoeducational    Topic: The therapists check in with group members, assess for SI/HI/psychosis and overall level of functioning. The therapists inquire about sobriety date and number of community support meetings attended since last session.   The therapist provides information on and facilitates discussions concerning a number of topics including why benzodiazepines are contraindicated in persons with alcohol use disorders, the need to limit caffeine consumption after noon in response to sleep problems, what makes addiction a disease rather than a choice, the problem with using opioids for managing chronic pain, what is meant by 13th stepping, and the importance of family therapy in addiction treatment.    Summary: Ellyanna presents today rating her depression as a 0 and her anxiety as a 2. She describes her mood as happy and excited.   She is attending meetings daily and has a Marketing Executive. She talks about having been put on a pain medication in the past and believes that it led to her relapsing on alcohol.   She is very engaged in today's group discussions asking appropriate questions.    Progress Towards Goals: Tresa reports no change in her sobriety date.   UDS collected: No Results: No   AA/NA attended?:  Yes   Sponsor?:  Yes   Elsie Maier, MA, LCSW, [facility], LCAS Darice Simpler, MS, LMFT, LCAS 12/03/2024

## 2024-12-05 ENCOUNTER — Ambulatory Visit (INDEPENDENT_AMBULATORY_CARE_PROVIDER_SITE_OTHER): Admitting: Licensed Clinical Social Worker

## 2024-12-05 ENCOUNTER — Other Ambulatory Visit (INDEPENDENT_AMBULATORY_CARE_PROVIDER_SITE_OTHER): Payer: MEDICAID | Admitting: Medical

## 2024-12-05 DIAGNOSIS — F331 Major depressive disorder, recurrent, moderate: Secondary | ICD-10-CM

## 2024-12-05 DIAGNOSIS — F902 Attention-deficit hyperactivity disorder, combined type: Secondary | ICD-10-CM | POA: Diagnosis not present

## 2024-12-05 DIAGNOSIS — F1020 Alcohol dependence, uncomplicated: Secondary | ICD-10-CM

## 2024-12-05 DIAGNOSIS — F411 Generalized anxiety disorder: Secondary | ICD-10-CM

## 2024-12-05 DIAGNOSIS — F1094 Alcohol use, unspecified with alcohol-induced mood disorder: Secondary | ICD-10-CM

## 2024-12-05 DIAGNOSIS — F17200 Nicotine dependence, unspecified, uncomplicated: Secondary | ICD-10-CM

## 2024-12-05 DIAGNOSIS — F39 Unspecified mood [affective] disorder: Secondary | ICD-10-CM

## 2024-12-05 DIAGNOSIS — Q320 Congenital tracheomalacia: Secondary | ICD-10-CM

## 2024-12-05 DIAGNOSIS — Z0282 Encounter for adoption services: Secondary | ICD-10-CM

## 2024-12-05 DIAGNOSIS — Z9189 Other specified personal risk factors, not elsewhere classified: Secondary | ICD-10-CM | POA: Diagnosis not present

## 2024-12-05 DIAGNOSIS — J439 Emphysema, unspecified: Secondary | ICD-10-CM

## 2024-12-05 DIAGNOSIS — Z811 Family history of alcohol abuse and dependence: Secondary | ICD-10-CM

## 2024-12-05 DIAGNOSIS — F10929 Alcohol use, unspecified with intoxication, unspecified: Secondary | ICD-10-CM

## 2024-12-05 MED ORDER — VARENICLINE TARTRATE 1 MG PO TABS: 1.0000 mg | ORAL_TABLET | Freq: Two times a day (BID) | ORAL | 2 refills | Status: AC

## 2024-12-05 MED ORDER — BACLOFEN 10 MG PO TABS: 10.0000 mg | ORAL_TABLET | Freq: Three times a day (TID) | ORAL | 1 refills | Status: AC

## 2024-12-05 NOTE — Progress Notes (Signed)
 Daily Group Progress Note   Program: [facility] IOP     Group Time: 9 a.m. to 12 p.m.      Type of Therapy: Process and Psychoeducational    Topic: The therapists check in with group members, assess for SI/HI/psychosis and overall level of functioning. The therapists inquire about sobriety date and number of community support meetings attended since last session.   The therapist shows a video on the stages of relapse indicating the importance of self-care so as to avoid emotional relapse. The therapist discusses CBS and specific cognitive distortions particularly related to addiction.  The therapist talks about avoiding people, places, and things and the particular challenge when it comes to addicted family and/or friends.    Summary: [name] presents today rating her depression and anxiety as a 0. She admits to feeling 30 rather than 58 likely as she was 59 years old around the time that her drinking became heavy. 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  [name] says that she has been doing this for a long time being in and out of AA having 18 months sober at once but in large part because of stomach issues that prevented her from drinking. After she had surgery for this issue, she resumed drinking.   She wants to know why she keeps relapsing and then goes on to says that she has an issue with people pleasing. She says that a friend told her that she would call her back so rather than sit and wait for the call so as to not miss it as a result of doing something else, she went ahead with doing things she needed to get done. She concludes that the friend ended up not calling her back. In the past, she would have set idle waiting for the call.   [name] describes her mood as optimistic and hopeful.    Progress Towards Goals: [name] reports no change in her sobriety date.    UDS collected: No Results: No   AA/NA attended?:  Yes   Sponsor?:  Yes   Elsie Maier, MA, LCSW, [facility], LCAS Darice Simpler, MS, LMFT, LCAS 12/05/2024

## 2024-12-08 ENCOUNTER — Ambulatory Visit (INDEPENDENT_AMBULATORY_CARE_PROVIDER_SITE_OTHER): Payer: MEDICAID | Admitting: Licensed Clinical Social Worker

## 2024-12-08 DIAGNOSIS — F902 Attention-deficit hyperactivity disorder, combined type: Secondary | ICD-10-CM

## 2024-12-08 DIAGNOSIS — F1020 Alcohol dependence, uncomplicated: Secondary | ICD-10-CM | POA: Diagnosis not present

## 2024-12-08 DIAGNOSIS — F17200 Nicotine dependence, unspecified, uncomplicated: Secondary | ICD-10-CM

## 2024-12-08 DIAGNOSIS — F39 Unspecified mood [affective] disorder: Secondary | ICD-10-CM

## 2024-12-08 NOTE — Progress Notes (Signed)
 Daily Group Progress Note   Program: CD IOP     Group Time: 9 a.m. to 12 p.m.      Type of Therapy: Process and Psychoeducational    Topic: The therapists check in with group members, assess for SI/HI/psychosis and overall level of functioning. The therapists inquire about sobriety date and number of community support meetings attended since last session.   The therapists discuss how punctuality and honesty connect to recovery showing a short video on recovery and honesty and eliciting feedback from group members about it. The therapists discuss how avoiding triggers for using is especially important for persons in early recovery and the fact that persons in recovery tend to trust their brains too much in terms of what they can handle. The therapists talk about the AA saying regarding not comparing one's insiders to other people's outsides and how to overcome social anxiety related to meeting attendance. The therapists show group members a video on how to do a friendventory and have group members start to share the results of their own. Lastly, the therapists answer questions concerns the use of light therapy for SAD.    Summary: Marigrace presents rating her depression as a 0 and her anxiety as an 8. She describes her mood as very anxious but determined. She was also very anxious on Saturday but called another group member to talk.   She admits to her anxiety stemming from social anxiety associated with doing things on her own. She says that her previous therapist had her go out eat by herself at [employer] as a form of a shame attacking exercise. The therapists suggest some more rational ways for her to view social interactions in place of her current, unhelpful thinking.   Cheyanne says that she once had 6 months sober but then stopped meetings and relapsed. Arnika says that she has friends in recovery but says that her problems stem from men who she will pick up along the way adding that  these men seem to find her fascinating.    Progress Towards Goals: Kalis reports no change in her sobriety date.    UDS collected: Yes Results: No   AA/NA attended?:  Yes   Sponsor?:  Yes   Elsie Maier, MA, LCSW, [facility], LCAS Darice Simpler, MS, LMFT, LCAS 12/08/2024

## 2024-12-10 ENCOUNTER — Encounter (HOSPITAL_COMMUNITY): Payer: Self-pay | Admitting: Medical

## 2024-12-10 ENCOUNTER — Ambulatory Visit (INDEPENDENT_AMBULATORY_CARE_PROVIDER_SITE_OTHER): Payer: MEDICAID | Admitting: Medical

## 2024-12-10 DIAGNOSIS — J439 Emphysema, unspecified: Secondary | ICD-10-CM

## 2024-12-10 DIAGNOSIS — F1020 Alcohol dependence, uncomplicated: Secondary | ICD-10-CM | POA: Diagnosis not present

## 2024-12-10 DIAGNOSIS — F17200 Nicotine dependence, unspecified, uncomplicated: Secondary | ICD-10-CM

## 2024-12-10 DIAGNOSIS — F411 Generalized anxiety disorder: Secondary | ICD-10-CM

## 2024-12-10 DIAGNOSIS — F902 Attention-deficit hyperactivity disorder, combined type: Secondary | ICD-10-CM

## 2024-12-10 DIAGNOSIS — F331 Major depressive disorder, recurrent, moderate: Secondary | ICD-10-CM

## 2024-12-10 DIAGNOSIS — F1094 Alcohol use, unspecified with alcohol-induced mood disorder: Secondary | ICD-10-CM

## 2024-12-10 DIAGNOSIS — Z9189 Other specified personal risk factors, not elsewhere classified: Secondary | ICD-10-CM

## 2024-12-10 DIAGNOSIS — F10929 Alcohol use, unspecified with intoxication, unspecified: Secondary | ICD-10-CM

## 2024-12-10 DIAGNOSIS — Z0282 Encounter for adoption services: Secondary | ICD-10-CM

## 2024-12-10 MED ORDER — BUDESON-GLYCOPYRROL-FORMOTEROL 160-9-4.8 MCG/ACT IN AERO: 2.0000 | INHALATION_SPRAY | Freq: Two times a day (BID) | RESPIRATORY_TRACT | 0 refills | Status: AC

## 2024-12-10 NOTE — Progress Notes (Signed)
 " Psychiatric Initial Adult Assessment   Patient Identification: [name] MRN:  [id number] Date of Evaluation:  12/10/2024 Referral Source: [facility] Chief Complaint:  No chief complaint on file.  Visit Diagnosis:    ICD-10-CM   1. Alcohol  use disorder, severe, dependence (HCC)  F10.20     2. 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 Attention deficit hyperactivity disorder (ADHD), combined type  F90.2     4. Tobacco use disorder  F17.200     5. 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 GAD (generalized anxiety disorder)  F41.1     7. Adopted infant  Z02.82    Does patient have siblings?: No    8. Chronic obstructive pulmonary disease with emphysema, unspecified emphysema type (HCC)  J43.9     9. Tracheomalacia, congenital  Q32.0     10. At risk for conflict between religious belief and healthcare recommendation  Z91.89    Comment:grew [facility] family where alcohol / drugs totallyunacceptable. Her father said ladies do not go to horse shows when she told she wanted to go.    11. Family history of alcoholism in maternal grandmother  Z81.1      History of Present Illness:    [name] presented 12/292025 to Counselor Elsie Maier LCSW having recently discharged from [facility] this past Friday, December 26 to start the SA IOP at [facility].She underwent THERAPIST PROGRESS NOTE/Addendum to CCA.  [name] meets admission criteria for the SA IOP level of Care  Plan: Return on 12/03/24 to start SA IOP  Diagnosis: Alcohol  use disorder, severe; unspecified affective disorder (rule out major depression versus bipolar 2 disorder;) ADHD, Combined Type per history, and Tobacco use Disorder  In speaking with her today, she seems quite anxious but competent. She is having cravings and is wanting to try Baclofen  MAT after reviewing video Baclofen  Reduces Alcohol  Cravings. She is  also concerned about her Asthma COPD and nicotene (Cigarette/Vape) dependence. She had good success with Campral  and is wanting to start again.  Associated Signs/Symptoms: ASAM Criteria [name] meets admission criteria for the SA IOP level of Care. In dimension 1, she has manageable withdrawal in the form of post-acute withdrawal. She has COPD and Asthma as well as some intermittent minor back pain but her medication conditions or not a distraction from treatment. Presently, her PHQ-9 is a 1 and GAD-7 as 2 so thus far her depression and anxiety are well-controlled on her psychotropic medications. In dimension 3, she is open to recovery but needs a structured environment to maintain therapeutic gains. In dimension 4, she has little awareness of relapse prevention skills necessary to maintain consistent sobriety. Her recovery sobriety is supportive.   Substance Use Disorder (SUD) Checklist  Symptoms of Substance Use: Continued use despite having a persistent/recurrent physical/psychological problem caused/exacerbated by use, Evidence of tolerance, Continued use despite persistent or recurrent social, interpersonal problems, caused or exacerbated by use, Persistent desire or unsuccessful efforts to cut down or control use, Social, occupational, recreational activities given up or reduced due to use, Substance(s) often taken in larger amounts or over longer times than was intended AUDIT    Flowsheet Row Admission (Discharged) from 08/21/2023 in [facility] BEHAVIORAL MEDICINE Admission (Discharged) [facility] Admission (Discharged) from [facility] Admission (Discharged) from [facility]/04[facility] Admission (Discharged) from 10[facility]  Alcohol  Use Disorder Identification Test Final Score (AUDIT) 15 29 4 17 6    Depression  Symptoms: PHQ2-9    Flowsheet Row Counselor from 12/01/2024 in [facility] ED from 01/10/2024 in [facility] Counselor from 10/29/2023 in [facility] ED from 10/24/2023 in [facility]  PHQ-2 Total Score 0 6 6 0  PHQ-9 Total Score -- 18 17 11    Flowsheet Row Counselor from 12/01/2024 in [facility] ED from 09/10/2024 in [facility] Emergency Department at [facility] ED to Hosp-Admission (Discharged) from 09/05/2024 in [facility]  C-SSRS RISK CATEGORY No Risk No Risk No Risk   (Hypo) Manic Symptoms: SUD related  Distractibility, Impulsivity, Irritable Mood, Labiality of Mood,  Anxiety Symptoms:   GAD-7    Flowsheet Row Counselor from 12/01/2024 in [facility] Counselor from 10/29/2023 in [facility]  Total GAD-7 Score 2 10   Psychotic Symptoms:  NONE  PTSD Symptoms: Qualifier:Chronic Comment:grew up in a [employer] family alcohol  and drugs totally unacceptable. Her father said ladies do not go to horse shows when she told she wanted to go.  Past Psychiatric History:  Erratic Historian November 2024 acility Based Crisis Admission H&P  HPI:  Ms. Macala Baldonado is a 59 yo female with a past psychiatric history of alcohol  use disorder, MDD, tobacco use disorder, multiple inpatient psychiatric hospitalizations (most recent [facility] 08/2023), multiple rehab stays (most recent [place]  rehab last July-September) who presents to the [facility] requesting detox from alcohol .  [facility] OP Mary. Senna at the [facility] deviously diagnosed [name] with bipolar disorder and had her taking Latuda and lithium  over a period of 2 years; however, while on the medication, she could barely get out of bed and says that she felt no emotions whatsoever.  Eventually,  his medication was discontinued.  She says that the Lamictal  is to help reduce feelings of irritability. [name] November 2024 Pt reports being diagnosed with depression and she states she is prescribed Prozac  40mg , lamotrogine 100mg  by Mary. Shirline.  [facility] [address] says that she did the SA IOP at [name] twice but did not find it helpful as it did not have a lot of information on relapse prevention.   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  [facility] 11/2024-present  Previous Psychotropic Medications: Yes  Multiple  Substance Abuse History in the last 12 months:  Yes.    Consequences of Substance Abuse: Medical Consequences:  Hospitalizations [facility] Center admissions OP Admissions Legal Consequences:  Lost Nursing License Family Consequences:  Father/son absent from list of supports Blackouts:  Yes DT's: Metabolic Encephalopathy Withdrawal Symptoms:     Metabolic Encephalopathy  Past Medical History:  Past Medical History:  Diagnosis Date   ADHD (attention deficit hyperactivity disorder)    Alcohol  dependence with acute alcoholic intoxication with complication (HCC) 01/05/2016   Anemia 1990s   Anxiety    Arthritis    neck   Asthma    uses inhalers every day   Cancer (HCC)    bladder   Cutaneous lupus erythematosus    Diverticulitis    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 female with medical history significant of asthma/COPD not on home oxygen, anxiety disorder, ADHD, depression was bibems due to SOB and being lethargic for few days now.  Has history of alcohol  abuse, last drink day before presentation.  Patient admitted for further management for acute hypoxic respiratory failure likely due to asthma/COPD exacerbation, acute toxic encephalopathy likely due to alcohol  use.  Hospital course as below   Acute hypoxic respiratory failure - resolved secondary to Asthma/ COPD exacerbation - Patient with shortness of breath, desaturation and tachycardia, requiring 2L [place] - now on RA - s/p Solu-Medrol  will switch to prednisone  to complete a total of 5 days course - Home inhalers, Albuterol  changed to Xopenex  due to tachycardia - states just completed course of doxycycline  - Seen by Pulmonary, follow up outpatient - Resume Dupulimab at discharge  Tracheobronchomalacia - Seen by Pulmonary - Follow up outpatient, may require surgical intervention via thoracic surgery  Acute toxic encephalopathy - resolved alcohol  use disorder - BAL high on presentation, last drink 10/02 - States was sober for 6 months and relapsed 3 weeks ago due to stressors. 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  Admit date: 08/22/2024 Discharge date: 08/27/2024   INITIAL HISTORY: [name] presents to our facility with  worsening tachycardia tachypnea and tremors concerning for alcohol  withdrawal. Patient has a known history of asthma, anxiety/depression, alcohol  abuse who [facility] and alcohol  detox but due to her worsening symptoms reported to our facility for further evaluation and treatment.   DISCHARGE DIAGNOSES:  Alcohol  withdrawal History of asthma History of anxiety and depression  RECOMMENDATIONS FOR OUTPATIENT FOLLOW UP: Outpatient follow-up with PCP Follow-up Information       Valora Agent, MD. Schedule an appointment as soon as possible for a visit in 1 week(s).   Specialty: Family Medicine Why: post hospitalization follow up Contact information: [address] [facility] [place] [address] [phone]     Past Surgical History:  Procedure Laterality Date   BLADDER SUSPENSION N/A 02/13/2013   Procedure: [facility] SLING;  Surgeon: Norleen JINNY Seltzer, MD;  Location: [facility];  Service: Urology;  Laterality: N/A;   CESAREAN SECTION  12-23-2004    W/ LEFT TUBAL LIGATION AND RIGHT SALPINGECTOMY   CESAREAN SECTION  1995   CYSTOSCOPY WITH STENT PLACEMENT Bilateral 06/16/2020   Procedure: CYSTOSCOPY WITH FIREFLY INJECTION;  Surgeon: Carolee Sherwood JONETTA DOUGLAS, MD;  Location: [facility];  Service: Urology;  Laterality: Bilateral;   IR RADIOLOGIST EVAL & MGMT  04/21/2020   IR RADIOLOGIST EVAL & MGMT  05/12/2020   ORIF HUMERUS FRACTURE Right 04/28/2014   Procedure: OPEN REDUCTION INTERNAL FIXATION (ORIF) PROXIMAL HUMERUS FRACTURE;  Surgeon: Eva Elsie Herring, MD;  Location: [facility];  Service: Orthopedics;  Laterality: Right;   ORIF PROXIMAL HUMERUS FRACTURE Right 04/28/2014   Mary CHANDLER   PROCTOSCOPY N/A 06/16/2020   Procedure: RIGID PROCTOSCOPY;  Surgeon: Sheldon Standing, MD;  Location: [facility];  Service: General;  Laterality: N/A;   TUBAL LIGATION      Family Psychiatric History: says that her biological maternal grandfather was  an alcoholic and that her biological father's mother reportedly had nerve problems.  Family History:  Family History  Adopted: Yes  See Psychiatric history  Social History:   Social History   Socioeconomic History   Marital status: Seperated    Spouse name: Not on file   Number of children: Does patient have children?: Yes How many children?: 2 How is patient's relationship with their children?: good w daughter (70) and son (48)     Years of education: Education Is Patient Currently Attending School?: No Did Garment/textile Technologist From [employer]?: Yes Did Theme Park Manager?: Yes What Type of College Degree Do you Have?: Nursing Did You Attend Graduate School?:  (unk) Did You Have An Individualized Education Program (IIEP): No Did You Have Any Difficulty At School?: No Patient's Education Has Been Impacted by Current Illness: No     Highest education level: Nursing degree  Occupational History   Describe how patient's job has been impacted: patient lost job and 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, states she was anxious to leave work and get home to drink, since then has completed various tx programs and AA, 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  Tobacco Use   Smoking status: Every Day    Current packs/day: 1.00    Average packs/day: 0.4 packs/day for 24.0 years (9.0 ttl pk-yrs)    Types: Cigarettes    Start date: 09/24/1999    Last attempt to quit: 09/24/2019   Smokeless tobacco: Never  Vaping Use   Vaping status: Every Day   Substances: Nicotine   Substance and Sexual Activity   Alcohol  use: Sober 10/27/2025    Alcohol /week: See CCAs    Types: See ASAM Criteria    Comment: 12/29/20 restarted    Drug use: No   Sexual activity: Not Currently    Birth control/protection: Post-menopausal  Other Topics Concern   Religion: Religion/Spirituality Are You A Religious Person?: No    Leisure/Recreation: Leisure / Recreation Do You Have Hobbies?: No   Exercise/Diet: Exercise/Diet Do You Exercise?: No Have You Gained or Lost A Significant Amount of Weight in the Past Six Months?: No Do You Follow a Special Diet?: No Do You Have Any Trouble Sleeping?: No    Social History Narrative   Blandina lives alone in a house which she rents.  He says that her mom, her best friend, and her 62 year old daughter are all supports and that none of them use substances.  [name] attends AA meetings 6 out of 7 days/week and has the same sponsor she is had since March of this year but is yet to start working any steps.  In the past, when she attempted to work steps, she would make it to step four an and  up relapsing.   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  Also, Treesa was having some sort of difficulty with this patient's husband.   Jannely was adopted at only a few weeks old and says that her biological maternal grandfather was an alcoholic and that her biological father's mother reportedly had nerve problems.   Other than alcohol , Minie smokes about 1/2 pack of cigarettes per day but also vapes nicotine  adding that she vapes more than she smokes.  She needs to quit both of these behaviors as she has COPD and asthma.  Cayle has tried to figure out what it is that causes her to repeatedly relapse and has concluded that it comes down to dating relationships with men, school, and her job.     Social Drivers of Health   Tobacco Use: High Risk (12/10/2024)   Patient History    Smoking Tobacco Use: Every Day    Smokeless Tobacco Use: Never    Passive Exposure: Not on file  Financial Resource Strain: Low Risk  (04/22/2024)   Received from [facility]   Overall Financial Resource Strain (CARDIA)    Difficulty of Paying Living Expenses: Not hard at all  Food  Insecurity: No Food Insecurity (09/06/2024)   Epic    Worried About Radiation Protection Practitioner of Food in the Last Year: Never true    Ran Out of Food in the Last Year: Never true  Transportation Needs: No Transportation Needs (09/06/2024)   Epic    Lack of Transportation (Medical): No    Lack of Transportation (Non-Medical): No  Physical Activity: Not on file  Stress: Not on file  Social Connections: Not on file  Depression (PHQ2-9): Low Risk (12/01/2024)   Depression (PHQ2-9)    PHQ-2 Score: 0  Alcohol  Screen: Medium Risk (08/22/2023)   Alcohol  Screen    Last Alcohol  Screening Score (AUDIT): 15  Housing: Low Risk (09/06/2024)   Epic    Unable to Pay for Housing in the Last Year: No    Number of Times Moved in the Last Year: 0    Homeless in the Last Year: No  Utilities: Not At Risk (09/06/2024)   Epic    Threatened with loss of utilities: No  Health Literacy: Not on file    Additional Social History:  How were you disciplined when you got in trouble as a child/adolescent? Pt reports her dad would take the door off the hinges and talk to me   Allergies:  Allergies[1]  Metabolic Disorder Labs: Lab Results  Component Value Date   HGBA1C 5.2 10/23/2023   MPG 102.54 10/23/2023   MPG 102.54 12/25/2022   Lab Results  Component Value Date   PROLACTIN 9.7 10/23/2023   Lab Results  Component Value Date   CHOL 147 01/10/2024   TRIG 494 (H) 01/10/2024   HDL 79 01/10/2024   CHOLHDL 1.9 01/10/2024   VLDL UNABLE TO CALCULATE IF TRIGLYCERIDE OVER 400 mg/dL 97/93/7974   LDLCALC UNABLE TO CALCULATE IF TRIGLYCERIDE OVER 400 mg/dL 97/93/7974   LDLCALC 10 10/23/2023   Lab Results  Component Value Date   TSH 0.786 01/10/2024    Therapeutic Level Labs: Lab Results  Component Value Date   LITHIUM  <0.06 (L) 11/23/2016   No results found for: CBMZ No results found for: VALPROATE  Current Medications: Current Outpatient Medications  Medication Sig Dispense Refill   albuterol  (ACCUNEB )  1.25 MG/3ML nebulizer solution Take 1 ampule by nebulization 3 (three) times daily as needed for wheezing or shortness of breath.     albuterol  (VENTOLIN  HFA) 108 (90 Base) MCG/ACT inhaler Inhale 1-2 puffs into the lungs every 6 (six) hours as needed for wheezing. 1 each 0   baclofen  (LIORESAL ) 10 MG tablet Take 1 tablet (10 mg total) by mouth 3 (three) times daily. 90 tablet 1   budesonide  (PULMICORT ) 0.5 MG/2ML nebulizer solution Inhale 0.5 mg into the lungs daily.     budesonide -glycopyrrolate -formoterol  (BREZTRI ) 160-9-4.8 MCG/ACT AERO inhaler Inhale 2 puffs into the lungs 2 (two) times daily for 120 doses. SEVERE COPD 1 each 0   buPROPion  (WELLBUTRIN  XL) 150 MG 24 hr tablet Take 1 tablet (150 mg total) by mouth daily. 30 tablet 2   busPIRone  (BUSPAR ) 10 MG tablet Take 10 mg by mouth 2 (two) times daily. (Patient not taking: Reported on 09/05/2024)     DUPIXENT 300 MG/2ML SOAJ Inject 300 mg into the skin every 14 (fourteen) days. (Patient not taking: Reported on 09/05/2024)     FLUoxetine  (PROZAC ) 20 MG capsule Take 20 mg by mouth every morning. (Patient not taking: Reported on 09/05/2024)     fluticasone  (FLONASE ) 50 MCG/ACT nasal spray Place 2 sprays into both nostrils daily.     folic acid  (FOLVITE ) 1 MG tablet Take 1 mg by mouth daily.     guaiFENesin -dextromethorphan  (ROBITUSSIN DM) 100-10 MG/5ML syrup Take 5 mLs by mouth every 4 (four) hours as needed for cough. 118 mL 0   hydrOXYzine  (ATARAX ) 25 MG tablet Take 1 tablet (25 mg total) by mouth every 6 (six) hours as needed (For anxiety or agitation). 90 tablet 0   lamoTRIgine  (LAMICTAL ) 100 MG tablet Take 50 mg by mouth 2 (two) times daily. (Patient not taking: Reported on 09/05/2024)     methocarbamol  (ROBAXIN ) 500 MG tablet Take 2 tablets (1,000 mg total) by mouth every 8 (eight) hours as needed for muscle spasms. 30 tablet 0   nicotine  (NICODERM CQ  - DOSED IN MG/24 HOURS) 14 mg/24hr patch Place 1 patch (14 mg total) onto the skin daily as  needed (For smoking cessation). 28 patch 0   predniSONE  (STERAPRED UNI-PAK 21 TAB) 10 MG (21) TBPK tablet Per packaging instruction 21 tablet 0   progesterone  (PROMETRIUM ) 100 MG capsule Take 100-200 mg by mouth at bedtime. (Patient not taking: Reported on 09/05/2024)     SUMAtriptan  (IMITREX ) 100 MG tablet Take 1 tablet (100 mg total) by mouth every 2 (two) hours as needed for migraine. May repeat in 2 hours if headache persists or recurs. Do not exceed 2 tablets within a 24 hour period. 10 tablet 0   thiamine  (VITAMIN B-1) 100 MG tablet Take 1 tablet (100 mg total) by mouth daily. 30 tablet 0   triamcinolone  cream (KENALOG ) 0.5 % Apply topically 2 (two) times daily. 30 g 0   varenicline  (CHANTIX  CONTINUING MONTH PAK) 1 MG tablet Take 1 tablet (1 mg total) by mouth 2 (two) times daily. 60 tablet 2   No current facility-administered medications for this visit.    Musculoskeletal: Strength & Muscle Tone: within normal limits Gait & Station: normal Patient leans: Front  Psychiatric Specialty Exam: Review of Systems  Constitutional:  Positive for activity change. Negative for appetite change, chills, diaphoresis, fatigue, fever and unexpected weight change.  HENT: Negative.    Eyes:  Negative for photophobia, pain, discharge, redness, itching and visual disturbance.  Respiratory:  Positive for cough, chest  tightness, shortness of breath and wheezing. Negative for apnea, choking and stridor.        COPD Tracheobronchial Achalasia  Cardiovascular:  Negative for chest pain, palpitations and leg swelling.  Gastrointestinal:  Negative for abdominal distention, abdominal pain, anal bleeding, blood in stool, constipation, diarrhea, nausea, rectal pain and vomiting.  Endocrine: Negative for cold intolerance, heat intolerance, polydipsia, polyphagia and polyuria.  Genitourinary:  Negative for decreased urine volume, difficulty urinating, dyspareunia, dysuria, enuresis, flank pain, frequency, genital  sores, hematuria, menstrual problem, pelvic pain, urgency, vaginal bleeding, vaginal discharge and vaginal pain.  Musculoskeletal:  Negative for arthralgias, back pain, gait problem, joint swelling, myalgias, neck pain and neck stiffness.  Skin:  Negative for color change, pallor, rash and wound.  Allergic/Immunologic: Negative for environmental allergies, food allergies and immunocompromised state.  Neurological:  Positive for tremors. Negative for dizziness, seizures, syncope, facial asymmetry, speech difficulty, weakness, light-headedness, numbness and headaches.  Hematological:  Negative for adenopathy. Does not bruise/bleed easily.  Psychiatric/Behavioral:  Positive for agitation, behavioral problems and dysphoric mood. Negative for confusion, decreased concentration, hallucinations, self-injury, sleep disturbance and suicidal ideas. The patient is nervous/anxious. The patient is not hyperactive.     Blood pressure 130/78, pulse 100, temperature 97.8 F (36.6 C), height 5' 5 (1.651 m), weight 135 lb 15.4 oz (61.7 kg), SpO2 98%.Body mass index is 22.62 kg/m.  General Appearance: Casual  Eye Contact:  Fair  Speech:  Clear and Coherent and Normal Rate  Volume:  Normal  Mood:  Anxious  Affect:  Appropriate and Congruent  Thought Process:  Coherent, Goal Directed, and Descriptions of Associations: Intact  Orientation:  Full (Time, Place, and Person)  Thought Content:  WDL  Suicidal Thoughts:  No  Homicidal Thoughts:  No  Memory:  Impaired  Judgement:  Impaired  Insight:  Lacking  Psychomotor Activity:  Negative  Concentration:  Concentration: Good and Attention Span: Good  Recall:  Fair  Fund of Knowledge:WDL  Language: Good  Akathisia:  NA  Handed:  Right  AIMS (if indicated):  NA  Assets:  Desire for Improvement Financial Resources/Insurance Housing Resilience Social Support Transportation Vocational/Educational  ADL's:  Impaired  Cognition: Impaired,  Moderate  Sleep:   No complaint   Screenings: AIMS    Flowsheet Row [facility] Admission (Discharged) from [facility] Admission (Discharged) from [facility] Admission (Discharged) [facility]  AIMS Total Score 0 0 0 0   AUDIT    Flowsheet Row [facility]) [facility] [facility] BEHAVIORAL MEDICINE Admission ([facility]) [facility] Admission (Discharged) from [facility]05[facility] Admission (Discharged) from 01/04[facility] Admission (Discharged) from 10[facility]06[facility]  Alcohol  Use Disorder Identification Test Final Score (AUDIT) 15 29 4 17 6    GAD-7    Flowsheet Row Counselor from 12/01/2024 in [facility] Counselor from 10/29/2023 in [facility]  Total GAD-7 Score 2 10   PHQ2-9    Flowsheet Row Counselor from 12/01/2024 in [facility] ED from 01/10/2024 in [facility] Counselor from 10/29/2023 in [facility] ED from 10/24/2023 in [facility]  PHQ-2 Total Score 0 6 6 0  PHQ-9 Total Score -- 18 17 11    Flowsheet Row Counselor from 12/01/2024 in [facility] ED from 09/10/2024 in [facility] at [facility] ED to Hosp-Admission (Discharged) from 09/05/2024 in [facility]  C-SSRS RISK CATEGORY No Risk No Risk No Risk    Assessment 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 and Hyperreligious family as only  child  Treatment Plan/Recommendations:  Plan of Care: SUDs/Core issues [facility] CDIOP see Counselor's individualized treatment plan  Laboratory:UDS per protocol  Psychotherapy: CD IOP Group,Individual and Family  Medications: See list Wiil start BaclofenMAT and resume Chantix   Routine PRN Medications: None from IOP  Consultations: NA  Safety Concerns: RISK ASSESSMENT -Negative  Other:  Anatomy and Biology of addiction/Addicted Brain reviewed with Google (Pictures of Anatomy and Function of Human brain along with  Pictures of Pet Scans Of Addicted Brains and You Tube video(Baclofen  reduces cravings)  and Plan:   Collaboration of Care: Primary Care Provider  and Other provider involved in patient's care  Patient/Guardian was advised Release of Information must be obtained prior to any record release in order to collaborate their care with an outside provider. Patient/Guardian was advised if they have not already done so to contact the registration department to sign all necessary forms in order for us  to release information regarding their care.   Consent: Patient/Guardian gives verbal consent for treatment and assignment of benefits for services provided during this visit. Patient/Guardian expressed understanding and agreed to proceed.   Carlin Emmer, PA-C 1/7/20265:46 PM     [1]  Allergies Allergen Reactions   Hydrocodone  Other (See Comments)    Dizzy; Tolerates oxycodone  fine   "

## 2024-12-10 NOTE — Progress Notes (Addendum)
 "  [name] Follow-up Outpatient CDIOP Date: 12/10/2024  Admission Date:12/05/2024  Sobriety date:1114/2025  Subjective:  I think (anxiety at [facility] [place] 8 am AA meeting in [place] drives from [place])) it may be due to not getting enough sleep? Having to get up at 5:30am to be ready by 7.Also am not eating -could be low blood sugar. I am out of oatmeal-I like oatmeal -add protein powder for taste-amgoing to store today. Couldn't ger prescriptions (fronm CDIOP) didn't get my new card yet. Counselor said I could just give them my number so will do that today  HPI : CD IOP Provider FU See Subjective She is also wheezing which is adding to her anxiety and needs rx for new med from Pulmonary specialist  Counselor's report: Summary: [name] presents rating her depression as a 0 and her anxiety as a 3. She says that her daughter and ex-husband are supports but says that the person who drags her down is her female friend, [name], whom she has known since kindergarten.  This man apparently wants a romantic relationship with [name] which she does not want; however, [name] has never made this clear to him. As she talks about this man, it also becomes clear that she does not really want to be friends with him but tolerates him; however, she has held onto him as a landscape architect of sorts knowing he would help her if her power got cut off etcetera.   It is suggested that if she is in essence using him, then it would be best for her to end this relationship, and it is pointed out that she can find supportive people who can be there for her in a crisis in the rooms of AA and people who she might look forward to hanging out with as opposed to just tolerating them.  [name] says that she grew up in a [employer] family such that alcohol  and drugs were both seen at totally unacceptable. She recounts her father telling her that ladies do not go to horse shows when she told him that she  wanted to attend one.   Review of Systems: Psychiatric: Agitation: See Counselor report and Subjective Hallucination: No Depressed Mood: see Counselor reports Insomnia: No but c/o sleep deprivation on current schedule Hypersomnia: No Altered Concentration: Hx of ADHD 3rd Grade complaint from teachers Feels Worthless: No Grandiose Ideas: No Belief In Special Powers: No New/Increased Substance Abuse: No Compulsions: In early withdrawal  Neurologic: Headache: No Seizure: No Paresthesias: No  Current Medications:  Your Medication List * albuterol  1.25 MG/3ML nebulizer solution Commonly known as: ACCUNEB  Take 1 ampule by nebulization 3 (three) times daily as needed for wheezing or shortness of breath.  * albuterol  108 (90 Base) MCG/ACT inhaler Commonly known as: VENTOLIN  HFA Inhale 1-2 puffs into the lungs every 6 (six) hours as needed for wheezing.  baclofen  10 MG tablet Commonly known as: LIORESAL  Take 1 tablet (10 mg total) by mouth 3 (three) times daily.  budesonide  0.5 MG/2ML nebulizer solution Commonly known as: PULMICORT  Inhale 0.5 mg into the lungs daily.  budesonide -glycopyrrolate -formoterol  160-9-4.8 MCG/ACT Aero inhaler Commonly known as: BREZTRI  Inhale 2 puffs into the lungs 2 (two) times daily for 120 doses. SEVERE COPD  buPROPion  150 MG 24 hr tablet Commonly known as: Wellbutrin  XL Take 1 tablet (150 mg total) by mouth daily.  busPIRone  10 MG tablet Commonly known as: BUSPAR  Take 10 mg by mouth 2 (two) times daily.  Dupixent 300 MG/2ML Soaj Generic  drug: Dupilumab Inject 300 mg into the skin every 14 (fourteen) days.  FLUoxetine  20 MG capsule Commonly known as: PROZAC  Take 20 mg by mouth every morning.  fluticasone  50 MCG/ACT nasal spray Commonly known as: FLONASE  Place 2 sprays into both nostrils daily.  folic acid  1 MG tablet Commonly known as: FOLVITE  Take 1 mg by mouth daily.  guaiFENesin -dextromethorphan  100-10 MG/5ML syrup Commonly known as: ROBITUSSIN DM  Take 5 mLs by mouth every 4 (four) hours as needed for cough.  hydrOXYzine  25 MG tablet Commonly known as: ATARAX  Take 1 tablet (25 mg total) by mouth every 6 (six) hours as needed (For anxiety or agitation).  lamoTRIgine  100 MG tablet Commonly known as: LAMICTAL  Take 50 mg by mouth 2 (two) times daily.  methocarbamol  500 MG tablet Commonly known as: ROBAXIN  Take 2 tablets (1,000 mg total) by mouth every 8 (eight) hours as needed for muscle spasms.  nicotine  14 mg/24hr patch Commonly known as: NICODERM CQ  - dosed in mg/24 hours Place 1 patch (14 mg total) onto the skin daily as needed (For smoking cessation).  predniSONE  10 MG (21) Tbpk tablet Commonly known as: STERAPRED UNI-PAK 21 TAB Per packaging instruction  progesterone  100 MG capsule Commonly known as: PROMETRIUM  Take 100-200 mg by mouth at bedtime.  SUMAtriptan  100 MG tablet Commonly known as: IMITREX  Take 1 tablet (100 mg total) by mouth every 2 (two) hours as needed for migraine. May repeat in 2 hours if headache persists or recurs. Do not exceed 2 tablets within a 24 hour period.  thiamine  100 MG tablet Commonly known as: Vitamin B-1 Take 1 tablet (100 mg total) by mouth daily.  triamcinolone  cream 0.5 % Commonly known as: KENALOG  Apply topically 2 (two) times daily.  varenicline  1 MG tablet Commonly known as: Chantix  Continuing Month Pak Take 1 tablet (1 mg tot    Mental Status Examination  Appearance: Alert: Yes Attention: good  Cooperative: Yes Eye Contact: Good Speech: Clear and coherent, rate WNL Psychomotor Activity: Normal Memory: Concentration/Attention: Normal/intact Oriented: person, place, time/date and situation Mood: Euthymic Affect: Appropriate and Congruent Thought Processes and Associations: Coherent and Intact Fund of Knowledge: Good Thought Content: WDL Insight:Limited Judgement:Impaired  LID:Ezwipwh  PDMP: 09/07/2024 09/07/2024  3 Lorazepam  1 Mg Tablet 6.00 3     2.00 LME Comm Ins [place]   08/27/2024 08/27/2024  4 Chlordiazepoxide  25 Mg Capsule 9.00 6          Diagnosis:  Alcohol  use disorder, severe, dependence (HCC) Unspecified mood (affective) disorder Attention deficit hyperactivity disorder (ADHD), combined type Tobacco use disorder Major depressive disorder, recurrent episode, 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 Recommend liquidvmeal replacements in addition to her oatmeal plan [name], [name]: [name], female   DOB: [birth year], 59 y.o.   MRN: [id number]  "

## 2024-12-10 NOTE — Addendum Note (Signed)
 Addended by: LEILA CARLIN BRAVO on: 12/10/2024 06:06 PM   Modules accepted: Level of Service

## 2024-12-10 NOTE — Progress Notes (Signed)
 Daily Group Progress Note   Program: CD IOP     Group Time: 9 a.m. to 12 p.m.      Type of Therapy: Process and Psychoeducational    Topic: The therapists check in with group members, assess for SI/HI/psychosis and overall level of functioning. The therapists inquire about sobriety date and number of community support meetings attended since last session.   The therapists have group members discuss the results of their friendventories and provide feedback to them. The therapists stress the importance of avoiding or minimizing contact with people, places, and things associated with using reiterating that persons in early recovery tend to overestimate what they can handle safely so erring on the side of caution is the most prudent course of action. The therapists begin having group members watch the video, Breaking the Addiction Cycle having them complete the first exercise in the video concerning family attitudes towards alcohol  and to drugs and what they considered an alcohol  or addict to be.    Summary: Ahilyn presents rating her depression as a 0 and her anxiety as a 3. She says that her daughter and ex-husband are supports but says that the person who drags her down is her female friend, Addie, whom she has known since kindergarten.  This man apparently wants a romantic relationship with Cyniah which she does not want; however, Ashonti has never made this clear to him. As she talks about this man, it also becomes clear that she does not really want to be friends with him but tolerates him; however, she has held onto him as a landscape architect of sorts knowing he would help her if her power got cut off etcetera.   It is suggested that if she is in essence using him, then it would be best for her to end this relationship, and it is pointed out that she can find supportive people who can be there for her in a crisis in the rooms of AA and people who she might look forward to hanging out with as  opposed to just tolerating them.  Mehreen says that she grew up in a [employer] family such that alcohol  and drugs were both seen at totally unacceptable. She recounts her father telling her that ladies do not go to horse shows when she told him that she wanted to attend one.    Progress Towards Goals: Sherron reports no change in her sobriety date.    UDS collected: No Results: No   AA/NA attended?:  Yes   Sponsor?:  Yes   Elsie Maier, MA, LCSW, [facility], LCAS Darice Simpler, MS, LMFT, LCAS 12/10/2024

## 2024-12-12 ENCOUNTER — Ambulatory Visit (INDEPENDENT_AMBULATORY_CARE_PROVIDER_SITE_OTHER): Payer: MEDICAID | Admitting: Licensed Clinical Social Worker

## 2024-12-12 DIAGNOSIS — F331 Major depressive disorder, recurrent, moderate: Secondary | ICD-10-CM

## 2024-12-12 DIAGNOSIS — F411 Generalized anxiety disorder: Secondary | ICD-10-CM

## 2024-12-12 DIAGNOSIS — F17200 Nicotine dependence, unspecified, uncomplicated: Secondary | ICD-10-CM

## 2024-12-12 DIAGNOSIS — F1020 Alcohol dependence, uncomplicated: Secondary | ICD-10-CM

## 2024-12-12 NOTE — Progress Notes (Addendum)
 Daily Group Progress Note   Program: CD IOP     Group Time: 9 a.m. to 12 p.m.      Type of Therapy: Process and Psychoeducational    Topic: The therapists check in with group members, assess for SI/HI/psychosis and overall level of functioning. The therapists inquire about sobriety date and number of community support meetings attended since last session.   The therapists continue to show the video on Breaking the Addiction Cycle having group members complete the 2nd exercise on the video by providing their favorite rational for I can't be an alcoholic or an addict because ____. The therapists explain that the three biggest misconceptions that cause people to not see that they are an addict or an alcoholic are the belief that in order to meet this criteria that one must experience substance-related withdrawal, that one must be totally non-functional as a result of his or her substance use, and that one must use out-of-control every time he or she uses and be incapable of ever using a small amount and then stopping.    Summary: [name] presents today rating both her depression and anxiety as a 0.   [name] has difficulty staying awake in group today dozing off numerous times. She says that she is sleepy as she has been staying up at night on her phone and unable to turn the television off even though she is not watching it. She will stay up so late that she will not take her Trazodone  as by the time she would take it, it would make it hard for her to wake up the next morning.   When the therapists talk to her about self-care involving a set bedtime, eating meals on a schedule, etcetera; she says that she is going to start taking her Trazodone  at 9:30 p.m. to be asleep at 10 p.m.  She describes her mood a tired and peaceful. She has not been to a meeting since Wednesday but is going to one tomorrow. She concludes that she is going to change Sponsors noting that her current Sponsor is pushy  and that she has a problem with her story. Another group member who has heard [name]'s Sponsor's story says that she believes that much of it is not true and that this woman is embellishing. She also says that she was put off by this woman bad mouthing her husband as well.   [name] says that her favorite denial thought was that she was not an alcoholic as she could go to work; however, she eventually got to the point that she could not.    Progress Towards Goals: [name] reports no change in her sobriety date.    UDS collected: No Results: No   AA/NA attended?:  No   Sponsor?:  Yes   Mary Maier, MA, LCSW, [facility], LCAS Mary Simpler, MS, LMFT, LCAS 12/12/2024

## 2024-12-15 ENCOUNTER — Ambulatory Visit (INDEPENDENT_AMBULATORY_CARE_PROVIDER_SITE_OTHER): Payer: MEDICAID | Admitting: Licensed Clinical Social Worker

## 2024-12-15 DIAGNOSIS — F331 Major depressive disorder, recurrent, moderate: Secondary | ICD-10-CM

## 2024-12-15 DIAGNOSIS — F411 Generalized anxiety disorder: Secondary | ICD-10-CM

## 2024-12-15 DIAGNOSIS — F1020 Alcohol dependence, uncomplicated: Secondary | ICD-10-CM

## 2024-12-15 DIAGNOSIS — F902 Attention-deficit hyperactivity disorder, combined type: Secondary | ICD-10-CM

## 2024-12-15 DIAGNOSIS — F17200 Nicotine dependence, unspecified, uncomplicated: Secondary | ICD-10-CM

## 2024-12-15 NOTE — Progress Notes (Signed)
 Daily Group Progress Note   Program: CD IOP     Group Time: 9 a.m. to 12 p.m.      Type of Therapy: Process and Psychoeducational    Topic: The therapists check in with group members, assess for SI/HI/psychosis and overall level of functioning. The therapists inquire about sobriety date and number of community support meetings attended since last session.   The therapists focus primarily on the topic of dealing with difficulty family members while in recovery highlighting the use of non-attending behavior. The therapists discuss putting recovery first and the need to set limits with those who impede this recovery. The therapists validate that life on life's terms will be difficult at time but can be navigated with sober supports and one's Higher Power without having to use substances.    Summary: [name] presents today rating her depression and anxiety both as a 0. She says that she has the same sobriety date and describes her mood as tired and frustrated.  [name] says that she has not felt good in the last week saying that she is tired and has no energy. She started Chantix  four days ago wondering if this could be the cause; however, she did not have this issue when she took it in the past.  She went to a meeting on Saturday and says that she missed her Sunday get-together with her female friend, [name]; and has yet to let him know that she does not want to keep hanging out with him.  As before, she appears sleepy at times during group seeming to dose off briefly. At the end of group, she asks about how long she will be in IOP noting that she needs to look for a job and indicating that when she was in the [facility]'s program that she thought that she would never graduate.  She then goes on to say that she might have tried going back to work too quickly in the past. The therapist talks with [name] about target symptoms that would suggest she was more ready to discharge with one being her  social anxiety and lack of sober supports.  Other group members encourage her to take advantage of her time not working to get to more meetings than she is currently attending with the therapist talking about this as exposure therapy for her anxiety.    Progress Towards Goals: [name] reports no change in her sobriety date.    UDS collected: Yes Results: No   AA/NA attended?:  Yes   Sponsor?:  Yes   Elsie Maier, MA, LCSW, [facility], LCAS Darice Simpler, MS, LMFT, LCAS 12/15/2024

## 2024-12-17 ENCOUNTER — Ambulatory Visit (INDEPENDENT_AMBULATORY_CARE_PROVIDER_SITE_OTHER): Payer: MEDICAID | Admitting: Licensed Clinical Social Worker

## 2024-12-17 ENCOUNTER — Other Ambulatory Visit (HOSPITAL_COMMUNITY): Payer: Self-pay | Admitting: Medical

## 2024-12-17 ENCOUNTER — Encounter (HOSPITAL_COMMUNITY): Payer: Self-pay | Admitting: Licensed Clinical Social Worker

## 2024-12-17 DIAGNOSIS — F1020 Alcohol dependence, uncomplicated: Secondary | ICD-10-CM

## 2024-12-17 DIAGNOSIS — F17200 Nicotine dependence, unspecified, uncomplicated: Secondary | ICD-10-CM

## 2024-12-17 DIAGNOSIS — F902 Attention-deficit hyperactivity disorder, combined type: Secondary | ICD-10-CM

## 2024-12-17 DIAGNOSIS — F331 Major depressive disorder, recurrent, moderate: Secondary | ICD-10-CM

## 2024-12-17 DIAGNOSIS — F411 Generalized anxiety disorder: Secondary | ICD-10-CM

## 2024-12-17 NOTE — Progress Notes (Signed)
 Daily Group Progress Note   Program: CD IOP     Group Time: 9 a.m. to 12 p.m.      Type of Therapy: Process and Psychoeducational    Topic: The therapist checks in with group members, assesses for SI/HI/psychosis and overall level of functioning. The therapist inquires about sobriety date and number of community support meetings attended since last session.   The therapist spends a great deal of time answering group members' questions concerning how addiction is a disease. The therapist continues having group members watch the video, Breaking the Addiction Cycle having them complete and share the exercise involving unmanageable. The therapist explains that recovery from addiction involves changing one's life and not just stopping the substance. The therapist continues to explain the reason that [place]  sober does not work and why any controlled substances should be avoiding if possible and viewed as potentially dangerous. The therapist discusses motivational interviewing as a way to address denial versus storming one's defenses.    Summary: [name] presents today rating her depression and anxiety as a 0. Early in group, she talks about having asthma and COPD with the therapist supporting the necessity of her quitting smoking tobacco. As in recent groups, she appears tired throughout group having times in which she struggles to not dose off.  [name] shares her story of unmanageability. She says that she had 6 months of sobriety after her first time in [facility] but that when she relapsed the last time that her drinking got so bad that she was waking up in the middle-of-the-night to drink which is something she has never previously done.   She attends meetings but has yet to get a new Sponsor and is not really checking in with her current Sponsor. She is about to pick up 90 days and is encouraged to get a new Sponsor asap with the therapist stressing the importance of her having a  Sponsor with whom she can check-in daily.    Progress Towards Goals: [name] reports no change in her sobriety date.    UDS collected: No Results: No   AA/NA attended?:  Yes   Sponsor?:  Yes   Elsie Maier, MA, LCSW, [facility], LCAS 12/17/2024

## 2024-12-17 NOTE — Addendum Note (Signed)
 Addended by: LEILA CARLIN BRAVO on: 12/17/2024 03:03 PM   Modules accepted: Level of Service

## 2024-12-17 NOTE — Progress Notes (Signed)
 Patient ID: [name], female   DOB: 10/11/1966, 59 y.o.   MRN: [id number] S-Pt requesting to be seen regarding medications and Pharmacy She did get a PA notice for her inhaler.She did not get her Baclofen  RX O-Med nurse at [facility] got PA for her inhaler sent to [facility] as she is pt there but went ahead and processed here Also contacted Pharmacy about 12/05/2024 Baclofen  rx. A Hopefully med issues resolved PFU at Friday Group

## 2024-12-19 ENCOUNTER — Ambulatory Visit (INDEPENDENT_AMBULATORY_CARE_PROVIDER_SITE_OTHER): Payer: MEDICAID

## 2024-12-19 DIAGNOSIS — F1094 Alcohol use, unspecified with alcohol-induced mood disorder: Secondary | ICD-10-CM

## 2024-12-19 DIAGNOSIS — F1020 Alcohol dependence, uncomplicated: Secondary | ICD-10-CM | POA: Diagnosis not present

## 2024-12-19 DIAGNOSIS — F902 Attention-deficit hyperactivity disorder, combined type: Secondary | ICD-10-CM

## 2024-12-19 DIAGNOSIS — F331 Major depressive disorder, recurrent, moderate: Secondary | ICD-10-CM

## 2024-12-19 DIAGNOSIS — F17200 Nicotine dependence, unspecified, uncomplicated: Secondary | ICD-10-CM

## 2024-12-19 DIAGNOSIS — F411 Generalized anxiety disorder: Secondary | ICD-10-CM

## 2024-12-19 NOTE — Progress Notes (Addendum)
 Daily Group Progress Note   Program: CD IOP     Group Time: 9 a.m. to 12 p.m.      Type of Therapy: Process and Psychoeducational    Topic: The therapist checks in with group members, assesses for SI/HI/psychosis and overall level of functioning. The therapist inquires about sobriety date and number of community support meetings attended since last session.   The therapist introduces the new group member. The therapist discusses the stages of relapse including the emotional, mental and physical. The therapist shows the second half of the [facility] video on the The Disease of Addiction which covers the preoccupation, compulsivity and despair components and facilitated discussion on these topics.  Summary: [name] presents today rating her depression and anxiety as a 0. She reports the same sobriety date and says she is coming up on 90 days of sobriety.  She reports she is attending meetings and has a sponsor but is looking for another sponsor. She says she should have a new one by the end of the weekend.She describes her emotions a 'calm and anxious.  [name] shares her story with the new group member. [name] says what gets in her way during the using the ritual is that she just says screw it and gets in the car and goes to get alcohol  to dink.  Her take away from today was that she could interrupt the using ritual.Medical assistance sent a message that [name]'s inhaler was approved so she could pick it up after group     Progress Towards Goals: [name] reports no change in her sobriety date.    UDS collected: yes Results: Dextromethrophan [name] says she has taken muccinex with DM)   AA/NA attended?:  Yes   Sponsor?:  Yes  Mary Simpler, MS, LMFT, LCAS 12/19/2024

## 2024-12-22 ENCOUNTER — Ambulatory Visit (INDEPENDENT_AMBULATORY_CARE_PROVIDER_SITE_OTHER): Payer: MEDICAID | Admitting: Licensed Clinical Social Worker

## 2024-12-22 DIAGNOSIS — F1020 Alcohol dependence, uncomplicated: Secondary | ICD-10-CM | POA: Diagnosis not present

## 2024-12-22 DIAGNOSIS — F331 Major depressive disorder, recurrent, moderate: Secondary | ICD-10-CM

## 2024-12-22 DIAGNOSIS — F411 Generalized anxiety disorder: Secondary | ICD-10-CM

## 2024-12-22 DIAGNOSIS — F902 Attention-deficit hyperactivity disorder, combined type: Secondary | ICD-10-CM

## 2024-12-22 DIAGNOSIS — F17200 Nicotine dependence, unspecified, uncomplicated: Secondary | ICD-10-CM

## 2024-12-22 NOTE — Progress Notes (Signed)
 Daily Group Progress Note   Program: [facility]     Group Time: 9 a.m. to 12 p.m.      Type of Therapy: Process and Psychoeducational    Topic: The therapists check in with group members, assess for SI/HI/psychosis and overall level of functioning. The therapists inquire about sobriety date and number of community support meetings attended since last session.   The therapists present information and facilitate group discussions on an array of topics including the importance of scheduling, the research on the importance of sober support and perceived family support on recovery, and medication assisted treatment for the management of opioid use disorders.    Summary: [name] presents rating her depression and anxiety as a 0. She describes her mood as blah adding that she is sick of winter. She says that she must be careful not to isolate though sounds to be doing just this as she is attending virtual meetings only and only going out at times to visit her mother who lives across the street. She says that her daughter will come to visit. 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  [name] describes herself as a chronic relapser saying that she believes she has identified the problem as being unable to sit with her feelings. Also, she says that she needs to keep away from relationships with men at the same time saying that she does not want to be alone. She continues to not completely let go of her female friend, [name], admitting he is her safety net. She almost called him to take her trash out when it was cold but stopped herself from doing this.  The therapist encourages [name] to consider the impact of hanging onto this safety net on herself and her recovery though she fails to see this but sees a risk of resenting [name]. The  therapist discloses his belief that she is in recovery drift with [facility] eventually concluding that she will get back to in person meetings and get a Marketing Executive. She says that when she first left [facility] that she had it planned out as to which meetings she would attend so will get back to this.   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   Progress Towards Goals: [name] reports no change in her sobriety date.    UDS collected: Yes Results: No   AA/NA attended?:  Yes   Sponsor?:  No   Mary Maier, MA, [place], [facility], LCAS Mary Simpler, MS, LMFT, LCAS 12/22/2024

## 2024-12-24 ENCOUNTER — Other Ambulatory Visit (HOSPITAL_COMMUNITY): Payer: Self-pay | Admitting: Medical

## 2024-12-24 ENCOUNTER — Ambulatory Visit (INDEPENDENT_AMBULATORY_CARE_PROVIDER_SITE_OTHER): Payer: MEDICAID

## 2024-12-24 DIAGNOSIS — F331 Major depressive disorder, recurrent, moderate: Secondary | ICD-10-CM

## 2024-12-24 DIAGNOSIS — F1020 Alcohol dependence, uncomplicated: Secondary | ICD-10-CM

## 2024-12-24 DIAGNOSIS — F411 Generalized anxiety disorder: Secondary | ICD-10-CM

## 2024-12-24 DIAGNOSIS — F17200 Nicotine dependence, unspecified, uncomplicated: Secondary | ICD-10-CM

## 2024-12-24 NOTE — Progress Notes (Signed)
 Daily Group Progress Note   Program: [facility]     Group Time: 9 a.m. to 12 p.m.      Type of Therapy: Process and Psychoeducational    Topic: The therapists check in with group members, assess for SI/HI/psychosis and overall level of functioning. The therapists inquire about sobriety date and number of community support meetings attended since last session.    The therapists introduces the new group member. In response to the group asking learning new information affects their sobriety, this therapist discusses the process of building new neuronal pathways in the brain. Therapist discusses how anxiety is related to substance use, integrating learning about PAWS, grounding techniques to lessen anxiety that brings the focus from the future to the present where irrational thoughts can be challenged and new behavior adapted in their routine.  Summary: [name] presents rating her depression and anxiety as a 0. She reports the same sobriety date.  She states she is attending meetings and has gotten a new sponsor. She shares her story of how she got to [facility] with the new member. She identifies her emotions as motivated and excited.   Progress Towards Goals: [name] reports no change in her sobriety date.    UDS collected: No Results: None   AA/NA attended?:  Yes   Sponsor?:  No  Darice Simpler, MS, LMFT, LCAS  [address] [place], [place], [facility], LCAS  12/24/2024

## 2024-12-26 ENCOUNTER — Ambulatory Visit (INDEPENDENT_AMBULATORY_CARE_PROVIDER_SITE_OTHER): Payer: MEDICAID | Admitting: Licensed Clinical Social Worker

## 2024-12-26 DIAGNOSIS — F331 Major depressive disorder, recurrent, moderate: Secondary | ICD-10-CM

## 2024-12-26 DIAGNOSIS — F1020 Alcohol dependence, uncomplicated: Secondary | ICD-10-CM

## 2024-12-26 DIAGNOSIS — F17200 Nicotine dependence, unspecified, uncomplicated: Secondary | ICD-10-CM

## 2024-12-26 DIAGNOSIS — F411 Generalized anxiety disorder: Secondary | ICD-10-CM

## 2024-12-26 DIAGNOSIS — F902 Attention-deficit hyperactivity disorder, combined type: Secondary | ICD-10-CM
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
## 2024-12-29 ENCOUNTER — Ambulatory Visit (HOSPITAL_COMMUNITY): Payer: MEDICAID | Admitting: Licensed Clinical Social Worker

## 2024-12-29 DIAGNOSIS — F1020 Alcohol dependence, uncomplicated: Secondary | ICD-10-CM

## 2024-12-29 DIAGNOSIS — F17200 Nicotine dependence, unspecified, uncomplicated: Secondary | ICD-10-CM

## 2024-12-29 DIAGNOSIS — F331 Major depressive disorder, recurrent, moderate: Secondary | ICD-10-CM

## 2024-12-29 DIAGNOSIS — F902 Attention-deficit hyperactivity disorder, combined type: Secondary | ICD-10-CM

## 2024-12-29 DIAGNOSIS — F411 Generalized anxiety disorder: Secondary | ICD-10-CM
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
## 2024-12-31 ENCOUNTER — Ambulatory Visit (INDEPENDENT_AMBULATORY_CARE_PROVIDER_SITE_OTHER): Payer: MEDICAID | Admitting: Licensed Clinical Social Worker

## 2024-12-31 DIAGNOSIS — F1020 Alcohol dependence, uncomplicated: Secondary | ICD-10-CM | POA: Diagnosis not present

## 2024-12-31 DIAGNOSIS — F411 Generalized anxiety disorder: Secondary | ICD-10-CM

## 2024-12-31 DIAGNOSIS — F331 Major depressive disorder, recurrent, moderate: Secondary | ICD-10-CM

## 2024-12-31 DIAGNOSIS — F17200 Nicotine dependence, unspecified, uncomplicated: Secondary | ICD-10-CM

## 2024-12-31 NOTE — Progress Notes (Signed)
 Daily Group Progress Note   Program: [facility] IOP     Group Time: 9 a.m. to 12 p.m.      Type of Therapy: Process and Psychoeducational    Topic: The therapist checks in with group members, assesses for SI/HI/psychosis and overall level of functioning. The therapist inquires about sobriety date and number of community support meetings attended since last session.   The therapist covers the stages of recovery including the abstinence stage, the repair stage, and the growth stage and the various length of time each stage normally takes and different challenges at each stage. The therapist focuses on self-care and the importance of sleeping on a routine, eating on a routine, etcetera. The therapist talks about Maslow's Hierarchy of need noting that one cannot be trying to do trauma work when one is without food or shelter. The therapist discusses how dual disorders treatment works and that getting medications adjusted properly can take time. The therapist emphasizes that many mental health disorders are chronic as is the case for addiction and that engaging in magical thinking that one is cured by stopping medications or sober support meetings tends to not end well. The therapist explains what a Wellness Action Recovery Plan is noting that it helps for people to be able to identify their early warnings signs and be proactive in getting help when they see them. The therapist adds that sometimes it is one's loved ones, Sponsor, therapist, etcetera who can spot these warning signs before the patient can. Lastly, the therapist covers the symptoms of PAWS and the need to avoid comparing one's self to others.    Summary: Sianne presents rating her depression and anxiety both as a 0 which is not congruent with her description of her mood as excited and worried.   She is worried about finding a job as she is running out of money. She says that this worry causes her difficulty sleeping so she has to take sleep  medication and she appears somnolent today as is the norm for her as her eyelids are heavy and at times she is clearly fighting to not dose off. 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  Melodye admits to having problems with asserting herself in addition to not being the best at maintaining a healthy routine. She says that the thing she does for fun is to read.  She has continued to do in person meetings and work with her Marketing Executive.    Progress Towards Goals: Raquelle reports no change in her sobriety date.    UDS collected: Yes Results: No   AA/NA attended?:  Yes   Sponsor?:  Yes   Elsie Maier, MA, LCSW, [facility], LCAS 12/31/2024

## 2025-01-02 ENCOUNTER — Telehealth (HOSPITAL_COMMUNITY): Payer: Self-pay | Admitting: Licensed Clinical Social Worker

## 2025-01-02 ENCOUNTER — Ambulatory Visit (HOSPITAL_COMMUNITY): Admitting: Licensed Clinical Social Worker

## 2025-01-02 NOTE — Telephone Encounter (Signed)
 The therapist returns [name]'s call confirming her identity via two identifiers. She was not in group today due to an abscessed tooth and is now on antibiotics. The therapist Ariann Khaimov of the planned late start to group on Monday due to the weather.  Mary Maier, MA, LCSW, [facility], LCAS 01/02/2025

## 2025-01-05 ENCOUNTER — Ambulatory Visit (HOSPITAL_COMMUNITY): Payer: MEDICAID | Admitting: Licensed Clinical Social Worker

## 2025-01-05 DIAGNOSIS — F902 Attention-deficit hyperactivity disorder, combined type: Secondary | ICD-10-CM

## 2025-01-05 DIAGNOSIS — F1020 Alcohol dependence, uncomplicated: Secondary | ICD-10-CM

## 2025-01-05 DIAGNOSIS — F17200 Nicotine dependence, unspecified, uncomplicated: Secondary | ICD-10-CM

## 2025-01-05 DIAGNOSIS — F411 Generalized anxiety disorder: Secondary | ICD-10-CM

## 2025-01-05 DIAGNOSIS — F331 Major depressive disorder, recurrent, moderate: Secondary | ICD-10-CM

## 2025-01-05 NOTE — Progress Notes (Signed)
 Note in error.

## 2025-01-07 ENCOUNTER — Ambulatory Visit (INDEPENDENT_AMBULATORY_CARE_PROVIDER_SITE_OTHER): Payer: MEDICAID | Admitting: Licensed Clinical Social Worker

## 2025-01-07 DIAGNOSIS — F1020 Alcohol dependence, uncomplicated: Secondary | ICD-10-CM | POA: Diagnosis not present

## 2025-01-07 DIAGNOSIS — F411 Generalized anxiety disorder: Secondary | ICD-10-CM

## 2025-01-07 DIAGNOSIS — F902 Attention-deficit hyperactivity disorder, combined type: Secondary | ICD-10-CM

## 2025-01-07 DIAGNOSIS — F17200 Nicotine dependence, unspecified, uncomplicated: Secondary | ICD-10-CM

## 2025-01-07 DIAGNOSIS — F331 Major depressive disorder, recurrent, moderate: Secondary | ICD-10-CM

## 2025-01-07 NOTE — Progress Notes (Signed)
" °  Daily Group Progress Note   Program: [facility]     Group Time: 9 a.m. to 12 p.m.    Type of Therapy: Process and Psychoeducational    Topic: The therapists check in with group members, assess for SI/HI/psychosis and overall level of functioning. The therapists inquire about sobriety date and number of community support meetings attended since last session.   The therapists discuss the fact that emotions are not right or wrong and that people have the right to feel however they feel. The therapists talk about how emotions can be used as helpful guides through life and not there are not good versus bad emotions. The therapists discuss the need for maintaining a healthy work/life balance suggesting that this requires assertiveness and a need to examine the character defects that contribute to workaholism. The therapists talk about using dreams and normalize them. They reframe relapses as being opportunities for learning rather than failures and discuss the importance of family therapy and the fact that it is offered as part of [facility] IOP.    Summary: [name] presents rating her depression as a 0 and her anxiety as a 4. She describes her mood as anxious and unsure.   She is sending her Sponsor a gratitude list daily and is supposed to meet with her on Saturday.   Kura says that presently she is worrying about work and money. She had to borrow money from her mother which she hates to do and says that her age does not help in getting a job and that her past DWI prevents her from being able to work at some agencies.   She says that her house is the main source of her financial drain and that she has considered leaving to and moving into a sober living house where she would have to attend meetings and be held accountable.   Farrah concludes that she has this decision and the work problem hanging over her head. The therapists and others provide suggestions for job hunting avenues she has yet to pursue  encouraging her to turn her anxiety into action.      Progress Towards Goals: Almira reports no change in her sobriety date.    UDS collected: Yes   Results: Yes, negative for drugs and alcohol    AA/NA attended?:  Yes   Sponsor?:  Yes   Elsie Maier, MA, LCSW, [facility], LCAS Darice Simpler, MS, LMFT, LCAS 01/07/2025      "
